# Patient Record
Sex: Male | Born: 1960
Health system: Southern US, Academic
[De-identification: ages and names within clinical notes are randomized; demographics above are authoritative.]

## PROBLEM LIST (undated history)

## (undated) ENCOUNTER — Ambulatory Visit: Payer: MEDICARE | Attending: Radiation Oncology | Primary: Radiation Oncology

## (undated) ENCOUNTER — Encounter

## (undated) ENCOUNTER — Ambulatory Visit: Payer: MEDICARE | Attending: Hematology & Oncology | Primary: Hematology & Oncology

## (undated) ENCOUNTER — Ambulatory Visit: Payer: MEDICARE

## (undated) ENCOUNTER — Telehealth: Attending: Hematology & Oncology | Primary: Hematology & Oncology

## (undated) ENCOUNTER — Encounter: Attending: Hematology & Oncology | Primary: Hematology & Oncology

## (undated) ENCOUNTER — Ambulatory Visit

## (undated) ENCOUNTER — Telehealth

## (undated) ENCOUNTER — Encounter
Attending: Pharmacist Clinician (PhC)/ Clinical Pharmacy Specialist | Primary: Pharmacist Clinician (PhC)/ Clinical Pharmacy Specialist

## (undated) ENCOUNTER — Ambulatory Visit
Payer: MEDICARE | Attending: Pharmacist Clinician (PhC)/ Clinical Pharmacy Specialist | Primary: Pharmacist Clinician (PhC)/ Clinical Pharmacy Specialist

## (undated) ENCOUNTER — Ambulatory Visit
Attending: Pharmacist Clinician (PhC)/ Clinical Pharmacy Specialist | Primary: Pharmacist Clinician (PhC)/ Clinical Pharmacy Specialist

## (undated) DIAGNOSIS — G8929 Other chronic pain: Secondary | ICD-10-CM

## (undated) DIAGNOSIS — N183 Chronic kidney disease, stage 3 unspecified: Secondary | ICD-10-CM

## (undated) DIAGNOSIS — C799 Secondary malignant neoplasm of unspecified site: Secondary | ICD-10-CM

## (undated) DIAGNOSIS — R5382 Chronic fatigue, unspecified: Secondary | ICD-10-CM

## (undated) HISTORY — DX: Other chronic pain: G89.29

## (undated) HISTORY — PX: APPENDECTOMY: SHX54

## (undated) HISTORY — DX: Chronic fatigue, unspecified: R53.82

## (undated) SURGERY — BRONCHOSCOPY, WITH EBUS
Anesthesia: General | Laterality: Right

---
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
## 2014-09-18 ENCOUNTER — Encounter (HOSPITAL_COMMUNITY): Payer: Self-pay | Admitting: *Deleted

## 2014-09-18 ENCOUNTER — Inpatient Hospital Stay (HOSPITAL_COMMUNITY)
Admission: AD | Admit: 2014-09-18 | Discharge: 2014-09-24 | DRG: 987 | Disposition: A | Discharge status: Skilled Nursing Facility | Payer: Medicare Other | Source: Other Acute Inpatient Hospital | Attending: Internal Medicine | Admitting: Internal Medicine

## 2014-09-18 DIAGNOSIS — M069 Rheumatoid arthritis, unspecified: Secondary | ICD-10-CM | POA: Diagnosis present

## 2014-09-18 DIAGNOSIS — C801 Malignant (primary) neoplasm, unspecified: Secondary | ICD-10-CM

## 2014-09-18 DIAGNOSIS — J9620 Acute and chronic respiratory failure, unspecified whether with hypoxia or hypercapnia: Secondary | ICD-10-CM | POA: Diagnosis present

## 2014-09-18 DIAGNOSIS — Z87891 Personal history of nicotine dependence: Secondary | ICD-10-CM | POA: Diagnosis not present

## 2014-09-18 DIAGNOSIS — Z6820 Body mass index (BMI) 20.0-20.9, adult: Secondary | ICD-10-CM | POA: Diagnosis not present

## 2014-09-18 DIAGNOSIS — N489 Disorder of penis, unspecified: Secondary | ICD-10-CM | POA: Diagnosis present

## 2014-09-18 DIAGNOSIS — C3411 Malignant neoplasm of upper lobe, right bronchus or lung: Principal | ICD-10-CM | POA: Diagnosis present

## 2014-09-18 DIAGNOSIS — R5381 Other malaise: Secondary | ICD-10-CM | POA: Diagnosis present

## 2014-09-18 DIAGNOSIS — R590 Localized enlarged lymph nodes: Secondary | ICD-10-CM | POA: Diagnosis present

## 2014-09-18 DIAGNOSIS — R0602 Shortness of breath: Secondary | ICD-10-CM | POA: Diagnosis present

## 2014-09-18 DIAGNOSIS — C7951 Secondary malignant neoplasm of bone: Secondary | ICD-10-CM | POA: Diagnosis present

## 2014-09-18 DIAGNOSIS — Z419 Encounter for procedure for purposes other than remedying health state, unspecified: Secondary | ICD-10-CM

## 2014-09-18 DIAGNOSIS — E440 Moderate protein-calorie malnutrition: Secondary | ICD-10-CM | POA: Diagnosis present

## 2014-09-18 DIAGNOSIS — R52 Pain, unspecified: Secondary | ICD-10-CM

## 2014-09-18 DIAGNOSIS — N179 Acute kidney failure, unspecified: Secondary | ICD-10-CM | POA: Diagnosis present

## 2014-09-18 DIAGNOSIS — J9601 Acute respiratory failure with hypoxia: Secondary | ICD-10-CM | POA: Diagnosis present

## 2014-09-18 DIAGNOSIS — R918 Other nonspecific abnormal finding of lung field: Secondary | ICD-10-CM | POA: Diagnosis present

## 2014-09-18 LAB — COMPREHENSIVE METABOLIC PANEL
ALT: 34 U/L (ref 0–53)
AST: 40 U/L — AB (ref 0–37)
Albumin: 2.3 g/dL — ABNORMAL LOW (ref 3.5–5.2)
Alkaline Phosphatase: 988 U/L — ABNORMAL HIGH (ref 39–117)
Anion gap: 7 (ref 5–15)
BUN: 29 mg/dL — ABNORMAL HIGH (ref 6–23)
CO2: 18 mmol/L — ABNORMAL LOW (ref 19–32)
Calcium: 8.1 mg/dL — ABNORMAL LOW (ref 8.4–10.5)
Chloride: 113 mmol/L — ABNORMAL HIGH (ref 96–112)
Creatinine, Ser: 1.55 mg/dL — ABNORMAL HIGH (ref 0.50–1.35)
GFR, EST AFRICAN AMERICAN: 57 mL/min — AB (ref >90)
GFR, EST NON AFRICAN AMERICAN: 49 mL/min — AB (ref >90)
Glucose, Bld: 116 mg/dL — ABNORMAL HIGH (ref 70–99)
POTASSIUM: 5 mmol/L (ref 3.5–5.1)
SODIUM: 138 mmol/L (ref 135–145)
Total Bilirubin: 0.3 mg/dL (ref 0.3–1.2)
Total Protein: 6.7 g/dL (ref 6.0–8.3)

## 2014-09-18 LAB — PROTIME-INR
INR: 1.16 (ref 0.00–1.49)
PROTHROMBIN TIME: 15 s (ref 11.6–15.2)

## 2014-09-18 LAB — CBC
HCT: 38.9 % — ABNORMAL LOW (ref 39.0–52.0)
Hemoglobin: 13 g/dL (ref 13.0–17.0)
MCH: 27.8 pg (ref 26.0–34.0)
MCHC: 33.4 g/dL (ref 30.0–36.0)
MCV: 83.3 fL (ref 78.0–100.0)
PLATELETS: 389 10*3/uL (ref 150–400)
RBC: 4.67 MIL/uL (ref 4.22–5.81)
RDW: 15.1 % (ref 11.5–15.5)
WBC: 15.4 10*3/uL — ABNORMAL HIGH (ref 4.0–10.5)

## 2014-09-18 MED ORDER — SODIUM CHLORIDE 0.9 % IJ SOLN: 3.0000 mL | INTRAMUSCULAR | Status: DC | PRN

## 2014-09-18 MED ORDER — ONDANSETRON HCL 4 MG/2ML IJ SOLN: 4.0000 mg | Freq: Four times a day (QID) | INTRAMUSCULAR | Status: DC | PRN

## 2014-09-18 MED ORDER — MORPHINE SULFATE 2 MG/ML IJ SOLN
1.0000 mg | INTRAMUSCULAR | Status: DC | PRN
Start: 2014-09-18 — End: 2014-09-24
  Administered 2014-09-18 – 2014-09-24 (×14): 1 mg via INTRAVENOUS
  Filled 2014-09-18 (×14): qty 1

## 2014-09-18 MED ORDER — OXYCODONE HCL 5 MG PO TABS
5.0000 mg | ORAL_TABLET | ORAL | Status: DC | PRN
  Administered 2014-09-18 – 2014-09-24 (×11): 5 mg via ORAL
  Filled 2014-09-18 (×11): qty 1

## 2014-09-18 MED ORDER — ALBUTEROL SULFATE (2.5 MG/3ML) 0.083% IN NEBU
2.5000 mg | INHALATION_SOLUTION | RESPIRATORY_TRACT | Status: DC | PRN
  Administered 2014-09-22: 2.5 mg via RESPIRATORY_TRACT
  Filled 2014-09-18: qty 3

## 2014-09-18 MED ORDER — IPRATROPIUM-ALBUTEROL 0.5-2.5 (3) MG/3ML IN SOLN
RESPIRATORY_TRACT | Status: AC
  Filled 2014-09-18: qty 3

## 2014-09-18 MED ORDER — HEPARIN SODIUM (PORCINE) 5000 UNIT/ML IJ SOLN
5000.0000 [IU] | Freq: Three times a day (TID) | INTRAMUSCULAR | Status: DC
  Administered 2014-09-18 – 2014-09-20 (×6): 5000 [IU] via SUBCUTANEOUS
  Filled 2014-09-18 (×9): qty 1

## 2014-09-18 MED ORDER — IPRATROPIUM-ALBUTEROL 0.5-2.5 (3) MG/3ML IN SOLN
3.0000 mL | Freq: Three times a day (TID) | RESPIRATORY_TRACT | Status: DC
  Administered 2014-09-18 – 2014-09-24 (×16): 3 mL via RESPIRATORY_TRACT
  Filled 2014-09-18 (×18): qty 3

## 2014-09-18 MED ORDER — ACETAMINOPHEN 650 MG RE SUPP: 650.0000 mg | Freq: Four times a day (QID) | RECTAL | Status: DC | PRN

## 2014-09-18 MED ORDER — ACETAMINOPHEN 325 MG PO TABS: 650.0000 mg | ORAL_TABLET | Freq: Four times a day (QID) | ORAL | Status: DC | PRN

## 2014-09-18 MED ORDER — ONDANSETRON HCL 4 MG PO TABS: 4.0000 mg | ORAL_TABLET | Freq: Four times a day (QID) | ORAL | Status: DC | PRN

## 2014-09-18 MED ORDER — GUAIFENESIN-DM 100-10 MG/5ML PO SYRP
5.0000 mL | ORAL_SOLUTION | ORAL | Status: DC | PRN
  Filled 2014-09-18: qty 5

## 2014-09-18 MED ORDER — SODIUM CHLORIDE 0.9 % IJ SOLN
3.0000 mL | Freq: Two times a day (BID) | INTRAMUSCULAR | Status: DC
  Administered 2014-09-18 – 2014-09-22 (×9): 3 mL via INTRAVENOUS

## 2014-09-18 MED ORDER — SODIUM CHLORIDE 0.9 % IV SOLN: 250.0000 mL | INTRAVENOUS | Status: DC | PRN

## 2014-09-18 MED ORDER — ALUM & MAG HYDROXIDE-SIMETH 200-200-20 MG/5ML PO SUSP: 30.0000 mL | Freq: Four times a day (QID) | ORAL | Status: DC | PRN

## 2014-09-18 NOTE — H&P (Signed)
PATIENT DETAILS Name: Francisco Love Age: 54 y.o. Sex: male Date of Birth: 1961/07/04 Admit Date: 09/18/2014 ASN:[id number] [name], [name], MD  CHIEF COMPLAINT:  Worsening Shortness of breath 2 weeks  HPI: Francisco Love is a 54 y.o. male with a Past Medical History of tobacco abuse who presents today with the above noted complaint. Per patient, for the past 2 weeks he has had significant worsening of his shortness of breath, shortness of breath is mostly on exertion. He claims that he is very short of breath upon walking from his car across his driveway to his front door. Because of persistently worsening exertional dyspnea, he presented to his local emergency room where a CT angiogram of the chest was done, it was negative for pulmonary embolism, however it was positive for a large right upper lobe encasing the right pulmonary artery and encroaching onto the carina of the trachea. He was then transferred to [facility] for further evaluation and treatment. Patient denies any chest pain, nausea, vomiting or diarrhea.  ALLERGIES:  Allergies not on file  PAST MEDICAL HISTORY: Tobacco abuse Lichen planus of the penis  PAST SURGICAL HISTORY: No past surgical history on file.  MEDICATIONS AT HOME: Prior to Admission medications   Not on File    FAMILY HISTORY: Has any family history of CAD  SOCIAL HISTORY:  has no tobacco, alcohol, and drug history on file.  REVIEW OF SYSTEMS:  Constitutional:   No  weight loss, night sweats,  Fevers, chills, fatigue.  HEENT:    No headaches, Difficulty swallowing,Tooth/dental problems,Sore throat,   Cardio-vascular: No chest pain,  Orthopnea, PND, swelling in lower extremities, anasarca, dizziness, palpitations  GI:  No heartburn, indigestion, abdominal pain, nausea, vomiting, diarrhea, change in bowel habits, loss of appetite  Resp: No shortness of breath with exertion or at rest.  No excess mucus, no productive  cough, No non-productive cough  Skin:  no rash or lesions.  GU:  no dysuria, change in color of urine, no urgency or frequency.  No flank pain.  Musculoskeletal: No joint pain or swelling.  No decreased range of motion.  No back pain.  Psych: No change in mood or affect. No depression or anxiety.  No memory loss.   PHYSICAL EXAM: Blood pressure 101/73, pulse 98, temperature 98.2 F (36.8 C), temperature source Oral, resp. rate 22, SpO2 94 %.  General appearance :Awake, alert, not in any distress. Speech Clear. Not toxic Looking HEENT: Atraumatic and Normocephalic, pupils equally reactive to light and accomodation Neck: supple, no JVD. No cervical lymphadenopathy.  Chest:Good air entry bilaterally, no added sounds. No prominent chest pains or neck veins. CVS: S1 S2 regular, no murmurs.  Abdomen: Bowel sounds present, Non tender and not distended with no gaurding, rigidity or rebound. Extremities: B/L Lower Ext shows no edema, both legs are warm to touch Neurology: Awake alert, and oriented X 3, CN II-XII intact, Non focal Skin:No Rash Wounds:N/A  LABS ON ADMISSION:  No results for input(s): NA, K, CL, CO2, GLUCOSE, BUN, CREATININE, CALCIUM, MG, PHOS in the last 72 hours. No results for input(s): AST, ALT, ALKPHOS, BILITOT, PROT, ALBUMIN in the last 72 hours. No results for input(s): LIPASE, AMYLASE in the last 72 hours. No results for input(s): WBC, NEUTROABS, HGB, HCT, MCV, PLT in the last 72 hours. No results for input(s): CKTOTAL, CKMB, CKMBINDEX, TROPONINI in the last 72 hours. No results for input(s): DDIMER in the last 72 hours. Invalid input(s): POCBNP  RADIOLOGIC STUDIES ON ADMISSION: No results found.    ASSESSMENT AND PLAN: Present on Admission:  . Lung mass: High suspicion for malignancy, no evidence of superior vena cava syndrome clinically. Case discussed with pulmonary on call Dr. Gwenette Greet, he will evaluate and provide further recommendations.   . Acute  respiratory failure: Likely secondary to above. Continue oxygen via nasal cannula. He currently is not short of breath at rest, but is mostly short of breath on ambulation.  Further plan will depend as patient's clinical course evolves and further radiologic and laboratory data become available. Patient will be monitored closely.  Above noted plan was discussed with patient, he was in agreement.   DVT Prophylaxis: Prophylactic  Heparin  Code Status: Full Code  Disposition Plan: Home when workup complete   Total time spent for admission equals 45 minutes.  [facility] Pager [phone]  If 7PM-7AM, please contact night-coverage [email or web address] Password [email or web address] 09/18/2014, 2:02 PM

## 2014-09-19 ENCOUNTER — Other Ambulatory Visit: Payer: Self-pay

## 2014-09-19 LAB — CBC
HCT: 36.2 % — ABNORMAL LOW (ref 39.0–52.0)
HEMOGLOBIN: 12 g/dL — AB (ref 13.0–17.0)
MCH: 27.6 pg (ref 26.0–34.0)
MCHC: 33.1 g/dL (ref 30.0–36.0)
MCV: 83.4 fL (ref 78.0–100.0)
Platelets: 393 10*3/uL (ref 150–400)
RBC: 4.34 MIL/uL (ref 4.22–5.81)
RDW: 15.1 % (ref 11.5–15.5)
WBC: 12.2 10*3/uL — ABNORMAL HIGH (ref 4.0–10.5)

## 2014-09-19 LAB — BASIC METABOLIC PANEL
Anion gap: 5 (ref 5–15)
BUN: 28 mg/dL — AB (ref 6–23)
CALCIUM: 8.1 mg/dL — AB (ref 8.4–10.5)
CHLORIDE: 114 mmol/L — AB (ref 96–112)
CO2: 18 mmol/L — AB (ref 19–32)
CREATININE: 1.28 mg/dL (ref 0.50–1.35)
GFR calc Af Amer: 71 mL/min — ABNORMAL LOW (ref >90)
GFR, EST NON AFRICAN AMERICAN: 61 mL/min — AB (ref >90)
Glucose, Bld: 108 mg/dL — ABNORMAL HIGH (ref 70–99)
Potassium: 4.5 mmol/L (ref 3.5–5.1)
Sodium: 137 mmol/L (ref 135–145)

## 2014-09-19 NOTE — Progress Notes (Addendum)
[facility] PROGRESS NOTE  Rutilio Yellowhair ERX:[id number] DOB: [birth year] DOA: 09/18/2014 PCP: [name], [name], MD  Brief narrative 54 year old male with history of tobacco abuse who presented with worsening shortness of breath for the past 2 weeks. He went to 3 months he was home in [place] with a CT angiogram of the chest was done which was negative for PE but showed a large right upper lobe mass encasing right pulmonary artery and encroaching onto the carina. Patient was then transferred to [facility] for further management.  Assessment/Plan: Lung mass Likely malignant. Admitting hospitalist discussed with pulmonary consult Dr. Gwenette Greet. Pt brought CD with the image with him. Unable to upload it over the weekend. Will be uploaded into the system tomorrow morning. Pulmonary will review the imaging tomorrow and recommend best approach for biopsy. Either bronchoscopy or IR guided.  Acute hypoxic respiratory failure Secondary to the lung mass. Continue O2 via nasal cannula. Clinically better today. Supportive care with when necessary nebs and antitussives.  Acute kidney injury Seen on admission. Result today  Tobacco abuse Patient reports quitting smoking for past 4-5 months.  Diet: Regular DVT prophylaxis: Subcutaneous heparin     Code Status: Full code Family Communication: None at bedside Disposition Plan: Home once workup completed   Consultants:  Pulmonary  Procedures:  None  Antibiotics:  None  HPI/Subjective: Sent seen and examined. Reports his breathing to be better.  Objective: Filed Vitals:   09/19/14 0539  BP: 103/67  Pulse: 76  Temp: 99.2 F (37.3 C)  Resp: 18    Intake/Output Summary (Last 24 hours) at 09/19/14 1325 Last data filed at 09/19/14 0900  Gross per 24 hour  Intake    120 ml  Output    300 ml  Net   -180 ml   Filed Weights   09/18/14 1515  Weight: 65.772 kg (145 lb)    Exam:   General:   Middle-aged male in no acute distress  HEENT: No pallor, moist oral mucosa, supple neck, no cervical lymphadenopathy  Cardiovascular: normal S1 and S2, no murmurs rub or gallop  Respiratory: clear  to auscultation bilaterally, no added sounds  GI: Soft, nondistended, bowel sounds present  Musculoskeletal: Warm, no edema  Data Reviewed: Basic Metabolic Panel:  Recent Labs Lab 09/18/14 1900 09/19/14 0530  NA 138 137  K 5.0 4.5  CL 113* 114*  CO2 18* 18*  GLUCOSE 116* 108*  BUN 29* 28*  CREATININE 1.55* 1.28  CALCIUM 8.1* 8.1*   Liver Function Tests:  Recent Labs Lab 09/18/14 1900  AST 40*  ALT 34  ALKPHOS 988*  BILITOT 0.3  PROT 6.7  ALBUMIN 2.3*   No results for input(s): LIPASE, AMYLASE in the last 168 hours. No results for input(s): AMMONIA in the last 168 hours. CBC:  Recent Labs Lab 09/18/14 1900 09/19/14 0530  WBC 15.4* 12.2*  HGB 13.0 12.0*  HCT 38.9* 36.2*  MCV 83.3 83.4  PLT 389 393   Cardiac Enzymes: No results for input(s): CKTOTAL, CKMB, CKMBINDEX, TROPONINI in the last 168 hours. BNP (last 3 results) No results for input(s): PROBNP in the last 8760 hours. CBG: No results for input(s): GLUCAP in the last 168 hours.  No results found for this or any previous visit (from the past 240 hour(s)).   Studies: No results found.  Scheduled Meds: . heparin  5,000 Units Subcutaneous 3 times per day  . ipratropium-albuterol  3 mL Nebulization TID  . sodium chloride  3 mL  Intravenous Q12H   Continuous Infusions:     Time spent: 25 minutes    Tyronda Vizcarrondo, Am[place]  [facility] Pager [phone]. If 7PM-7AM, please contact night-coverage at [email or web address], password [facility] 09/19/2014, 1:25 PM  LOS: 1 day

## 2014-09-20 ENCOUNTER — Encounter (HOSPITAL_COMMUNITY): Payer: Self-pay | Admitting: Radiology

## 2014-09-20 ENCOUNTER — Inpatient Hospital Stay (HOSPITAL_COMMUNITY): Payer: Medicare Other

## 2014-09-20 DIAGNOSIS — R0602 Shortness of breath: Secondary | ICD-10-CM

## 2014-09-20 DIAGNOSIS — N4889 Other specified disorders of penis: Secondary | ICD-10-CM

## 2014-09-20 DIAGNOSIS — R918 Other nonspecific abnormal finding of lung field: Secondary | ICD-10-CM

## 2014-09-20 LAB — HEPATIC FUNCTION PANEL
ALT: 34 U/L (ref 0–53)
AST: 29 U/L (ref 0–37)
Albumin: 2.3 g/dL — ABNORMAL LOW (ref 3.5–5.2)
Alkaline Phosphatase: 882 U/L — ABNORMAL HIGH (ref 39–117)
Total Bilirubin: 0.1 mg/dL — ABNORMAL LOW (ref 0.3–1.2)
Total Protein: 6.5 g/dL (ref 6.0–8.3)

## 2014-09-20 MED ORDER — IOHEXOL 300 MG/ML  SOLN
100.0000 mL | Freq: Once | INTRAMUSCULAR | Status: AC | PRN
  Administered 2014-09-20: 100 mL via INTRAVENOUS

## 2014-09-20 MED ORDER — HYDROCORTISONE 1 % EX CREA
TOPICAL_CREAM | Freq: Two times a day (BID) | CUTANEOUS | Status: DC
  Administered 2014-09-20 – 2014-09-24 (×8): via TOPICAL
  Filled 2014-09-20 (×2): qty 28

## 2014-09-20 NOTE — Consult Note (Signed)
Name: Francisco Love MRN: [id number] DOB: 1960/12/21    ADMISSION DATE:  09/18/2014 CONSULTATION DATE:  09/20/2014  REFERRING MD :  [facility]  CHIEF COMPLAINT:  Lung mass  BRIEF PATIENT DESCRIPTION:  54 yo male former smoker with progressive dyspnea.  He had recent CT chest as outpt which showed lung mass.  SIGNIFICANT EVENTS  1/30 admit  STUDIES:  1/29 CT chest (OSH) >> RUL/mediastinal mass, emphysema   HISTORY OF PRESENT ILLNESS:   54 yo male presented to OSH with progressive dyspnea.  He has extensive hx of smoking, and quit few months ago.  He had CXR which showed fullness, and then had CT chest.  He was transferred to [facility] for further assessment.  Unfortunately his CT chest was not available for review until today, and pulmonary consult was then done.  He denies headache, blurred vision, hoarseness, dysphagia, gland swelling, chest pain, fever, hemoptysis, sweats, abdominal pain, or weight loss.   He reports hx of rheumatoid arthritis in his legs and takes ibuprofen.  PAST MEDICAL HISTORY :   has a past medical history of Shortness of breath dyspnea.  has past surgical history that includes Appendectomy. Prior to Admission medications   Medication Sig Start Date End Date Taking? Authorizing Provider  ibuprofen (ADVIL,MOTRIN) 200 MG tablet Take 400 mg by mouth daily.   Yes Historical Provider, MD   No Known Allergies  FAMILY HISTORY:  family history is not on file. SOCIAL HISTORY:  reports that he quit smoking about 3 months ago. His smoking use included Cigarettes. He smoked 1.00 pack per day. He does not have any smokeless tobacco history on file. He reports that he uses illicit drugs (Marijuana). He reports that he does not drink alcohol.  REVIEW OF SYSTEMS:   Negative except above SUBJECTIVE:   VITAL SIGNS: Temp:  [97.5 F (36.4 C)-98.1 F (36.7 C)] 98.1 F (36.7 C) (02/01 0524) Pulse Rate:  [88-102] 88 (02/01 0524) Resp:  [17-18] 17 (02/01 0524) BP:  (100-112)/(70-78) 108/70 mmHg (02/01 0524) SpO2:  [99 %-100 %] 100 % (02/01 0524)  PHYSICAL EXAMINATION: General: no distress Neuro: alert, normal strength, CN intact HEENT: pupils reactive, no sinus tenderness, MP 2, poor dentition, no LAN Cardiovascular: regular Lungs: bronchial breath sounds upper lobes, no wheeze Abdomen: soft, non tender Musculoskeletal: no edema Skin: desquamation of dorsal penis    Recent Labs Lab 09/18/14 1900 09/19/14 0530  NA 138 137  K 5.0 4.5  CL 113* 114*  CO2 18* 18*  BUN 29* 28*  CREATININE 1.55* 1.28  GLUCOSE 116* 108*     Recent Labs Lab 09/18/14 1900 09/19/14 0530  HGB 13.0 12.0*  HCT 38.9* 36.2*  WBC 15.4* 12.2*  PLT 389 393    Dg Outside Films Extremity  09/19/2014   This examination belongs to an outside facility and is stored  here for comparison purposes only.  Contact the originating outside  institution for any associated report or interpretation.   ASSESSMENT / PLAN:  54 yo male smoker with RUL and mediastinal mass >> main concern is for malignancy. Plan: - He has been scheduled for bronchoscopy at 8 am on 09/21/14 - procedure explained to patient >> risks detailed as bleeding, infection, pneumothorax, and non diagnosis  Hx of smoking with changes of emphysema on CT chest. Plan: - will need PFT's as outpt  ?Hx of Rheumatoid arthritis. Plan: - per primary team  AKI >> resolved.  Chesley Mires, MD [facility] Pulmonary/Critical Care 09/20/2014, 2:23 PM Pager:  [phone]  After 3pm call: [phone]

## 2014-09-20 NOTE — Progress Notes (Addendum)
[facility] PROGRESS NOTE  Meagan Spease [id number]:[id number] DOB: [birth year] DOA: 09/18/2014 PCP: [name], [name], MD  Brief narrative 54 year old male with history of tobacco abuse who presented with worsening shortness of breath for the past 2 weeks. He went to ED in [place] , [place] where a CT angiogram of the chest was done which was negative for PE but showed a large right upper lobe mass encasing right pulmonary artery and encroaching onto the carina. Patient was then transferred to [facility] for further management.  Assessment/Plan: Lung mass Likely malignant. Admitting hospitalist discussed with pulmonary consult Dr. Gwenette Greet. CT angio done at outside hospital uploaded into the system. Pulmonary will see pt today and decide best approach for biopsy.  continue o2 via [place].  Acute hypoxic respiratory failure Secondary to the lung mass. Continue O2 via nasal cannula. Still dyspneic on minimal exertion.  Supportive care with when necessary nebs and antitussives.  Penile lesion  desquamation of the dorsal glans penis. Reports non bloody discharge from the skin.. No urethral discharge.non tender, or itchy. Possible for lichen sclerosis.  Will place on hydrocortisone ointment . Check HIV ab Needs dermatology evaluation as outpt  Acute kidney injury Seen on admission. Resolved.  Elevated alk P Repeat LFTs. Check CT abd and pelvis to r/o mets   Tobacco abuse Patient reports quitting smoking for past 4-5 months.  Diet: Regular  DVT prophylaxis: Subcutaneous heparin     Code Status: Full code Family Communication: None at bedside Disposition Plan: Home once workup completed   Consultants:  Pulmonary  Procedures:  None  Antibiotics:  None  HPI/Subjective: Sent seen and examined. Still has dyspnea on minimal exertion  Objective: Filed Vitals:   09/20/14 0524  BP: 108/70  Pulse: 88  Temp: 98.1 F (36.7 C)  Resp: 17    Intake/Output  Summary (Last 24 hours) at 09/20/14 0092 Last data filed at 09/20/14 3300  Gross per 24 hour  Intake      0 ml  Output    900 ml  Net   -900 ml   Filed Weights   09/18/14 1515  Weight: 65.772 kg (145 lb)    Exam:   General:  Middle-aged male in no acute distress  HEENT: No pallor, moist oral mucosa, supple neck, no cervical, supraclavicular  or axillary lymphadenopathy  Cardiovascular: normal S1 and S2, no murmurs rub or gallop  Respiratory: clear  to auscultation bilaterally, no added sounds  GI: Soft, nondistended, bowel sounds present, desquamation of dorsal glans penis with erythema. No discharge or bleeding.  Musculoskeletal: Warm, no edema  Data Reviewed: Basic Metabolic Panel:  Recent Labs Lab 09/18/14 1900 09/19/14 0530  NA 138 137  K 5.0 4.5  CL 113* 114*  CO2 18* 18*  GLUCOSE 116* 108*  BUN 29* 28*  CREATININE 1.55* 1.28  CALCIUM 8.1* 8.1*   Liver Function Tests:  Recent Labs Lab 09/18/14 1900  AST 40*  ALT 34  ALKPHOS 988*  BILITOT 0.3  PROT 6.7  ALBUMIN 2.3*   No results for input(s): LIPASE, AMYLASE in the last 168 hours. No results for input(s): AMMONIA in the last 168 hours. CBC:  Recent Labs Lab 09/18/14 1900 09/19/14 0530  WBC 15.4* 12.2*  HGB 13.0 12.0*  HCT 38.9* 36.2*  MCV 83.3 83.4  PLT 389 393   Cardiac Enzymes: No results for input(s): CKTOTAL, CKMB, CKMBINDEX, TROPONINI in the last 168 hours. BNP (last 3 results) No results for input(s): PROBNP in the last 8760  hours. CBG: No results for input(s): GLUCAP in the last 168 hours.  No results found for this or any previous visit (from the past 240 hour(s)).   Studies: Dg Outside Films Extremity  09/19/2014   This examination belongs to an outside facility and is stored  here for comparison purposes only.  Contact the originating outside  institution for any associated report or interpretation.   Scheduled Meds: . heparin  5,000 Units Subcutaneous 3 times per day   . ipratropium-albuterol  3 mL Nebulization TID  . sodium chloride  3 mL Intravenous Q12H   Continuous Infusions:     Time spent: 25 minutes    Crew Goren  [facility] Pager [phone]. If 7PM-7AM, please contact night-coverage at [email or web address], password [facility] 09/20/2014, 9:58 AM  LOS: 2 days

## 2014-09-21 ENCOUNTER — Encounter (HOSPITAL_COMMUNITY)
Admission: AD | Disposition: A | Discharge status: Skilled Nursing Facility | Payer: Self-pay | Source: Other Acute Inpatient Hospital | Attending: Internal Medicine

## 2014-09-21 ENCOUNTER — Inpatient Hospital Stay (HOSPITAL_COMMUNITY): Payer: Medicare Other

## 2014-09-21 DIAGNOSIS — C7951 Secondary malignant neoplasm of bone: Secondary | ICD-10-CM | POA: Diagnosis present

## 2014-09-21 DIAGNOSIS — C801 Malignant (primary) neoplasm, unspecified: Secondary | ICD-10-CM

## 2014-09-21 DIAGNOSIS — R599 Enlarged lymph nodes, unspecified: Secondary | ICD-10-CM

## 2014-09-21 DIAGNOSIS — R590 Localized enlarged lymph nodes: Secondary | ICD-10-CM | POA: Diagnosis present

## 2014-09-21 HISTORY — PX: VIDEO BRONCHOSCOPY: SHX5072

## 2014-09-21 SURGERY — BRONCHOSCOPY, WITH FLUOROSCOPY
Anesthesia: Moderate Sedation

## 2014-09-21 MED ORDER — MIDAZOLAM HCL 5 MG/ML IJ SOLN
INTRAMUSCULAR | Status: AC
  Filled 2014-09-21: qty 2

## 2014-09-21 MED ORDER — MIDAZOLAM HCL 10 MG/2ML IJ SOLN
INTRAMUSCULAR | Status: DC | PRN
  Administered 2014-09-21 (×3): 1 mg via INTRAVENOUS

## 2014-09-21 MED ORDER — LIDOCAINE HCL (PF) 1 % IJ SOLN
INTRAMUSCULAR | Status: DC | PRN
  Administered 2014-09-21: 6 mL

## 2014-09-21 MED ORDER — PHENYLEPHRINE HCL 0.25 % NA SOLN
NASAL | Status: DC | PRN
  Administered 2014-09-21: 2 via NASAL

## 2014-09-21 MED ORDER — FENTANYL CITRATE 0.05 MG/ML IJ SOLN
INTRAMUSCULAR | Status: AC
  Filled 2014-09-21: qty 4

## 2014-09-21 MED ORDER — FENTANYL CITRATE 0.05 MG/ML IJ SOLN
INTRAMUSCULAR | Status: DC | PRN
  Administered 2014-09-21 (×2): 25 ug via INTRAVENOUS

## 2014-09-21 MED ORDER — LIDOCAINE VISCOUS 2 % MT SOLN
OROMUCOSAL | Status: DC | PRN
Start: 2014-09-21 — End: 2014-09-23
  Administered 2014-09-21: 1 via OROMUCOSAL

## 2014-09-21 NOTE — Op Note (Signed)
Indication : Paratracheal mass with RUL narrowing in this ex smoker. Written informed consent was obtained prior to the procedure. The risks of the procedure including coughing, bleeding and the small chance of lung puncture requiring chest tube were discussed in great detail. The benefits & alternatives including serial follow up were also discussed. Low yield of procedure in absence of endobronchial mass & possible need for second rpocedure (EBUS) was discussed   3 mg versed & 50  mcg fentnayl used in divided doses during the procedure Bronchoscope entered from the left nare. Upper airway nml Vocal cords showed nml appearance & motion. Trachea & bronchial tree examined to the subsegmental level. Mild amount of white secretions were noted. No endobronchial lesions seen. Trans bronchial needle aspiration x 4 were obtained from station 7 & station 4R. Brushings & BAL was also obtained from the RUL.  There was moderate coughing  during the procedure.    ALVA,RAKESH V.  [phone]

## 2014-09-21 NOTE — Progress Notes (Signed)
Name: Francisco Love MRN: [id number] DOB: 01/11/1961    ADMISSION DATE:  09/18/2014 CONSULTATION DATE:  09/20/2014  REFERRING MD :  [facility]  CHIEF COMPLAINT:  Lung mass  BRIEF PATIENT DESCRIPTION:  54 yo male former smoker with progressive dyspnea.  He had recent CT chest as outpt which showed lung mass.  SIGNIFICANT EVENTS  1/30 admit  STUDIES:  1/29 CT chest (OSH) >> RUL/mediastinal mass, emphysema   HISTORY OF PRESENT ILLNESS:   54 yo male presented to OSH with progressive dyspnea.  He has extensive hx of smoking, and quit few months ago.  He had CXR which showed fullness, and then had CT chest.  He was transferred to [facility] for further assessment.  Unfortunately his CT chest was not available for review until today, and pulmonary consult was then done.  He denies headache, blurred vision, hoarseness, dysphagia, gland swelling, chest pain, fever, hemoptysis, sweats, abdominal pain, or weight loss.   He reports hx of rheumatoid arthritis in his legs and takes ibuprofen.  SUBJECTIVE: c/o pain in legs afebrile  VITAL SIGNS: Temp:  [97.6 F (36.4 C)-97.8 F (36.6 C)] 97.6 F (36.4 C) (02/02 0545) Pulse Rate:  [83-102] 100 (02/02 0857) Resp:  [16-28] 22 (02/02 0857) BP: (92-124)/(60-85) 99/60 mmHg (02/02 0857) SpO2:  [85 %-100 %] 99 % (02/02 0857) Weight:  [63.504 kg (140 lb)] 63.504 kg (140 lb) (02/02 0754)  PHYSICAL EXAMINATION: General: no distress Neuro: alert, normal strength, CN intact HEENT: pupils reactive, no sinus tenderness, MP 2, poor dentition, no LAN Cardiovascular: regular Lungs: bronchial breath sounds upper lobes, no wheeze Abdomen: soft, non tender Musculoskeletal: no edema Skin: desquamation of dorsal penis    Recent Labs Lab 09/18/14 1900 09/19/14 0530  NA 138 137  K 5.0 4.5  CL 113* 114*  CO2 18* 18*  BUN 29* 28*  CREATININE 1.55* 1.28  GLUCOSE 116* 108*     Recent Labs Lab 09/18/14 1900 09/19/14 0530  HGB 13.0 12.0*  HCT 38.9*  36.2*  WBC 15.4* 12.2*  PLT 389 393    Ct Abdomen Pelvis W Contrast  09/20/2014   CLINICAL DATA:  Mediastinal mass.  Evaluate for metastatic disease.  EXAM: CT ABDOMEN AND PELVIS WITH CONTRAST  TECHNIQUE: Multidetector CT imaging of the abdomen and pelvis was performed using the standard protocol following bolus administration of intravenous contrast.  CONTRAST:  188mL OMNIPAQUE IOHEXOL 300 MG/ML  SOLN  COMPARISON:  Outside CT with 09/17/2014.  FINDINGS: Lower chest: Diffuse ground-glass opacities in lower lobes. Bibasilar atelectasis.  Hepatobiliary: Single low-density lesion in the posterior aspect the right hepatic lobe measures 6 mm on image 32 is too small characterize. No biliary duct dilatation. Gallbladder is normal.  Pancreas: Multilobular cystic lesions in the uncinate region of the pancreas with measuring approximately 21 x 25 mm on coronal image 42. There is calcifications within the uncinate process additionally. Main pancreatic duct is prominent within normal limits at 2 mm. The tail of pancreas is normal.  Spleen: Normal spleen.  Adrenals/urinary tract: Normal adrenal glands and kidneys. The ureters and bladder normal.  Stomach/Bowel: The stomach, small bowel, colon are unremarkable.  Vascular/Lymphatic: There is severe circumferential narrowing of the left common iliac artery (image 52). This artery does reconstitute.  No retroperitoneal periportal lymphadenopathy no central mesenteric adenopathy. No pelvic.  Reproductive: Asymmetric thickening along the peripheral zone of the prostate gland on the left (image 70).  Musculoskeletal: Severe degenerative changes hips remodeling of femoral heads with extensive sub chondral cystic change.  There are smudgy sclerotic lesions throughout skeleton. Lesions in left and right inferior pubic rami on image 79 and 7 7. Multiple lesions to the sacrum iliac bones. Lesions in the proximal left femur. Example lesions inn left iliac wing on image 60 measures 25  mm). Multiple lesions throughout lumbar spine hand ribs. Example lesion at T11 measures 20 mm  Other: Left inguinal hernia small amount fluid.  IMPRESSION: 1. Widespread skeletal metastasis in the pelvis, femurs, spine, and ribs. 2. Asymmetric thickening of the left aspect of prostate gland peripheral zone could represent prostate carcinoma if no primary discovered. 3. Cystic lesion within the uncinate process of pancreas. Recommend correlation with CA 19 none and followup MRI of the pancreas with contrast in no primary discovered. 4. Small hypodense lesion within the liver cannot be fully characterized. 5. Severe atherosclerotic narrowing of the left common iliac artery with reconstitution.   Electronically Signed   By: Suzy Bouchard M.D.   On: 09/20/2014 18:06   Dg Outside Films Extremity  09/19/2014   This examination belongs to an outside facility and is stored  here for comparison purposes only.  Contact the originating outside  institution for any associated report or interpretation.   ASSESSMENT / PLAN:  54 yo male smoker with RUL and mediastinal mass >> paratracheal & mild subcarinal lymphadenopathy main concern is for malignancy. Plan: - He has been scheduled for bronchoscopy at 8 am on 09/21/14 -he really needs EBUS with aspiration of 4R  - The risks of the procedure including coughing, bleeding and the small chance of lung puncture requiring chest tube were discussed in great detail. The benefits & alternatives including serial follow up were also discussed. Low yield of procedure in absence of endobronchial mass & possible need for second rpocedure (EBUS) was discussed  Hx of smoking with changes of emphysema on CT chest. Plan: - will need PFT's as outpt  ?Hx of Rheumatoid arthritis. Plan: - per primary team  AKI >> resolved.  He really wants diagnosis & wants me to proceed even if low yield  Shantara Goosby V. MD 09/21/2014, 9:01 AM

## 2014-09-21 NOTE — Clinical Documentation Improvement (Signed)
Dear Dr. Kara Mead   Please update your documentation with the medical record to reflect your response to this query. If you need help knowing how to do this please call [phone].       In reviewing the Bronchoscopy report noted documentation of aspiration sites at "station 7 and 4R".  Please provide specific  anatomical sites referring to those "stations", also if it was tissue  or fluid or gas aspirated and the :   Clinical site   Lung (left / right)  (lower/middle/upper)   Bronchus (left /right) ([place], Lower, Main, Middle, Upper)   Thank you   [facility] cannot make assumptions from the lab reports they can only code from providers specific documentation .   Thank you   [place]  Clinical Documentation Specialist: [place]

## 2014-09-21 NOTE — Care Management (Signed)
09-21-14 IM from Medicare given. Magdalen Spatz RN BSN

## 2014-09-21 NOTE — Progress Notes (Addendum)
[facility] PROGRESS NOTE  Asberry Lascola GYF:[id number] DOB: Aug 04, 1961 DOA: 09/18/2014 PCP: [name], [name], MD  Brief narrative 54 year old male with history of tobacco abuse who presented with worsening shortness of breath for the past 2 weeks. He went to ED in [place] , [place] where a CT angiogram of the chest was done which was negative for PE but showed a large right upper lobe mass encasing right pulmonary artery and encroaching onto the carina. Patient was then transferred to [facility] for further management. Pulmonary consulted. CT abd and pelvis shows diffuse skeletal metastasis., cystic lesion on pancreas and  Asymmetric thickening of prostate.   Assessment/Plan: Lung mass Likely malignant, primary vs metastatic.  CT angio done at outside hospital uploaded into the system.  Bronchoscopy attempted today shows no endobronchial lesion. Plan on Endobronchial [place] later this week. Transbronchial needle aspiration done. - continue o2 via [place].  Diffuse skeletal metastasis, possible pancreatic and prostatic lesion  as seen on CT Markedly elevated ALKP due to bony mets.  Check CA19-9 and if elevated order MRI of abd to evaluate pancreatic lesion further.  Acute hypoxic respiratory failure Secondary to the lung mass. Continue O2 via nasal cannula. Supportive care with when necessary nebs and antitussives.  Penile lesion  desquamation of the dorsal glans penis. Reports non bloody discharge from the skin.. No urethral discharge.non tender, or itchy. Possible for lichen sclerosis.  Feels better with  on hydrocortisone ointment . Check HIV ab Needs dermatology evaluation as outpt  Acute kidney injury Seen on admission. Resolved.    Tobacco abuse Patient reports quitting smoking for past 4-5 months.  Diet: Regular  DVT prophylaxis: Subcutaneous heparin     Code Status: Full code Family Communication: None at bedside Disposition Plan: Home once workup  completed   Consultants:  Pulmonary  Procedures:  None  Antibiotics:  None  HPI/Subjective: Seen and examined after bronchoscopy. reports some dyspnea on exertion and cough.   Objective: Filed Vitals:   09/21/14 1310  BP: 92/70  Pulse: 86  Temp: 98 F (36.7 C)  Resp: 20    Intake/Output Summary (Last 24 hours) at 09/21/14 1448 Last data filed at 09/21/14 0546  Gross per 24 hour  Intake    360 ml  Output   1250 ml  Net   -890 ml   Filed Weights   09/18/14 1515 09/21/14 0754  Weight: 65.772 kg (145 lb) 63.504 kg (140 lb)    Exam:   General:   in no acute distress  HEENT: No pallor, moist oral mucosa, supple neck,   Cardiovascular: normal S1 and S2, no murmurs rub or gallop  Respiratory: clear  to auscultation bilaterally, no added sounds  GI: Soft, nondistended, , desquamation of dorsal glans penis with erythema. No discharge or bleeding.  Musculoskeletal: Warm, no edema  Data Reviewed: Basic Metabolic Panel:  Recent Labs Lab 09/18/14 1900 09/19/14 0530  NA 138 137  K 5.0 4.5  CL 113* 114*  CO2 18* 18*  GLUCOSE 116* 108*  BUN 29* 28*  CREATININE 1.55* 1.28  CALCIUM 8.1* 8.1*   Liver Function Tests:  Recent Labs Lab 09/18/14 1900 09/20/14 1120  AST 40* 29  ALT 34 34  ALKPHOS 988* 882*  BILITOT 0.3 0.1*  PROT 6.7 6.5  ALBUMIN 2.3* 2.3*   No results for input(s): LIPASE, AMYLASE in the last 168 hours. No results for input(s): AMMONIA in the last 168 hours. CBC:  Recent Labs Lab 09/18/14 1900 09/19/14 0530  WBC 15.4* 12.2*  HGB 13.0 12.0*  HCT 38.9* 36.2*  MCV 83.3 83.4  PLT 389 393   Cardiac Enzymes: No results for input(s): CKTOTAL, CKMB, CKMBINDEX, TROPONINI in the last 168 hours. BNP (last 3 results) No results for input(s): PROBNP in the last 8760 hours. CBG: No results for input(s): GLUCAP in the last 168 hours.  No results found for this or any previous visit (from the past 240 hour(s)).   Studies: Ct  Abdomen Pelvis W Contrast  09/20/2014   CLINICAL DATA:  Mediastinal mass.  Evaluate for metastatic disease.  EXAM: CT ABDOMEN AND PELVIS WITH CONTRAST  TECHNIQUE: Multidetector CT imaging of the abdomen and pelvis was performed using the standard protocol following bolus administration of intravenous contrast.  CONTRAST:  160mL OMNIPAQUE IOHEXOL 300 MG/ML  SOLN  COMPARISON:  Outside CT with 09/17/2014.  FINDINGS: Lower chest: Diffuse ground-glass opacities in lower lobes. Bibasilar atelectasis.  Hepatobiliary: Single low-density lesion in the posterior aspect the right hepatic lobe measures 6 mm on image 32 is too small characterize. No biliary duct dilatation. Gallbladder is normal.  Pancreas: Multilobular cystic lesions in the uncinate region of the pancreas with measuring approximately 21 x 25 mm on coronal image 42. There is calcifications within the uncinate process additionally. Main pancreatic duct is prominent within normal limits at 2 mm. The tail of pancreas is normal.  Spleen: Normal spleen.  Adrenals/urinary tract: Normal adrenal glands and kidneys. The ureters and bladder normal.  Stomach/Bowel: The stomach, small bowel, colon are unremarkable.  Vascular/Lymphatic: There is severe circumferential narrowing of the left common iliac artery (image 52). This artery does reconstitute.  No retroperitoneal periportal lymphadenopathy no central mesenteric adenopathy. No pelvic.  Reproductive: Asymmetric thickening along the peripheral zone of the prostate gland on the left (image 70).  Musculoskeletal: Severe degenerative changes hips remodeling of femoral heads with extensive sub chondral cystic change.  There are smudgy sclerotic lesions throughout skeleton. Lesions in left and right inferior pubic rami on image 79 and 7 7. Multiple lesions to the sacrum iliac bones. Lesions in the proximal left femur. Example lesions inn left iliac wing on image 60 measures 25 mm). Multiple lesions throughout lumbar spine  hand ribs. Example lesion at T11 measures 20 mm  Other: Left inguinal hernia small amount fluid.  IMPRESSION: 1. Widespread skeletal metastasis in the pelvis, femurs, spine, and ribs. 2. Asymmetric thickening of the left aspect of prostate gland peripheral zone could represent prostate carcinoma if no primary discovered. 3. Cystic lesion within the uncinate process of pancreas. Recommend correlation with CA 19 none and followup MRI of the pancreas with contrast in no primary discovered. 4. Small hypodense lesion within the liver cannot be fully characterized. 5. Severe atherosclerotic narrowing of the left common iliac artery with reconstitution.   Electronically Signed   By: Suzy Bouchard M.D.   On: 09/20/2014 18:06   Dg Outside Films Extremity  09/19/2014   This examination belongs to an outside facility and is stored  here for comparison purposes only.  Contact the originating outside  institution for any associated report or interpretation.   Scheduled Meds: . hydrocortisone cream   Topical BID  . ipratropium-albuterol  3 mL Nebulization TID  . sodium chloride  3 mL Intravenous Q12H   Continuous Infusions:     Time spent: 25 minutes    Nihal Marzella, [place]  [facility] Pager [phone]. If 7PM-7AM, please contact night-coverage at [email or web address], password [facility] 09/21/2014, 2:48 PM  LOS: 3 days

## 2014-09-21 NOTE — Progress Notes (Signed)
Video bronchoscopy with wang needle intervention, washing intervention, brushing intervention. Pt. Did well thru out procedure. Gave report to Precious Bard RN for Mr. Barnier 50 Fentayl, 3 Versed, told RN what samples we did.

## 2014-09-22 ENCOUNTER — Encounter (HOSPITAL_COMMUNITY): Payer: Self-pay | Admitting: Pulmonary Disease

## 2014-09-22 LAB — CANCER ANTIGEN 19-9: CA 19 9: 19.1 U/mL — AB (ref <35.0)

## 2014-09-22 NOTE — Progress Notes (Signed)
Name: Francisco Love MRN: [id number] DOB: [birth year]    ADMISSION DATE:  09/18/2014 CONSULTATION DATE:  09/20/2014  REFERRING MD :  [facility]  CHIEF COMPLAINT:  Lung mass  BRIEF PATIENT DESCRIPTION:  54 yo male former smoker with progressive dyspnea.  He had recent CT chest as outpt which showed lung mass.  SIGNIFICANT EVENTS  1/30 admit  STUDIES:  1/29 CT chest (OSH) >> RUL/mediastinal mass, emphysema   HISTORY OF PRESENT ILLNESS:   54 yo male presented to OSH with progressive dyspnea.  He has extensive hx of smoking, and quit few months ago.  He had CXR which showed fullness, and then had CT chest.  He was transferred to [facility] for further assessment.  Unfortunately his CT chest was not available for review until today, and pulmonary consult was then done.  He denies headache, blurred vision, hoarseness, dysphagia, gland swelling, chest pain, fever, hemoptysis, sweats, abdominal pain, or weight loss.   He reports hx of rheumatoid arthritis in his legs and takes ibuprofen.  SUBJECTIVE: c/o pain in legs afebrile  VITAL SIGNS: Temp:  [98 F (36.7 C)-98.3 F (36.8 C)] 98.2 F (36.8 C) (02/03 0551) Pulse Rate:  [86-106] 102 (02/03 0551) Resp:  [16-20] 16 (02/03 0551) BP: (92-125)/(70-75) 125/75 mmHg (02/03 0551) SpO2:  [94 %-98 %] 94 % (02/03 0551)  PHYSICAL EXAMINATION: General: no distress Neuro: alert, normal strength, CN intact HEENT: pupils reactive, no sinus tenderness, MP 2, poor dentition, no LAN Cardiovascular: regular Lungs: bronchial breath sounds upper lobes, no wheeze Abdomen: soft, non tender Musculoskeletal: no edema Skin: desquamation of dorsal penis    Recent Labs Lab 09/18/14 1900 09/19/14 0530  NA 138 137  K 5.0 4.5  CL 113* 114*  CO2 18* 18*  BUN 29* 28*  CREATININE 1.55* 1.28  GLUCOSE 116* 108*     Recent Labs Lab 09/18/14 1900 09/19/14 0530  HGB 13.0 12.0*  HCT 38.9* 36.2*  WBC 15.4* 12.2*  PLT 389 393    Ct Abdomen Pelvis W  Contrast  09/20/2014   CLINICAL DATA:  Mediastinal mass.  Evaluate for metastatic disease.  EXAM: CT ABDOMEN AND PELVIS WITH CONTRAST  TECHNIQUE: Multidetector CT imaging of the abdomen and pelvis was performed using the standard protocol following bolus administration of intravenous contrast.  CONTRAST:  19mL OMNIPAQUE IOHEXOL 300 MG/ML  SOLN  COMPARISON:  Outside CT with 09/17/2014.  FINDINGS: Lower chest: Diffuse ground-glass opacities in lower lobes. Bibasilar atelectasis.  Hepatobiliary: Single low-density lesion in the posterior aspect the right hepatic lobe measures 6 mm on image 32 is too small characterize. No biliary duct dilatation. Gallbladder is normal.  Pancreas: Multilobular cystic lesions in the uncinate region of the pancreas with measuring approximately 21 x 25 mm on coronal image 42. There is calcifications within the uncinate process additionally. Main pancreatic duct is prominent within normal limits at 2 mm. The tail of pancreas is normal.  Spleen: Normal spleen.  Adrenals/urinary tract: Normal adrenal glands and kidneys. The ureters and bladder normal.  Stomach/Bowel: The stomach, small bowel, colon are unremarkable.  Vascular/Lymphatic: There is severe circumferential narrowing of the left common iliac artery (image 52). This artery does reconstitute.  No retroperitoneal periportal lymphadenopathy no central mesenteric adenopathy. No pelvic.  Reproductive: Asymmetric thickening along the peripheral zone of the prostate gland on the left (image 70).  Musculoskeletal: Severe degenerative changes hips remodeling of femoral heads with extensive sub chondral cystic change.  There are smudgy sclerotic lesions throughout skeleton. Lesions in left and  right inferior pubic rami on image 79 and 7 7. Multiple lesions to the sacrum iliac bones. Lesions in the proximal left femur. Example lesions inn left iliac wing on image 60 measures 25 mm). Multiple lesions throughout lumbar spine hand ribs. Example  lesion at T11 measures 20 mm  Other: Left inguinal hernia small amount fluid.  IMPRESSION: 1. Widespread skeletal metastasis in the pelvis, femurs, spine, and ribs. 2. Asymmetric thickening of the left aspect of prostate gland peripheral zone could represent prostate carcinoma if no primary discovered. 3. Cystic lesion within the uncinate process of pancreas. Recommend correlation with CA 19 none and followup MRI of the pancreas with contrast in no primary discovered. 4. Small hypodense lesion within the liver cannot be fully characterized. 5. Severe atherosclerotic narrowing of the left common iliac artery with reconstitution.   Electronically Signed   By: Suzy Bouchard M.D.   On: 09/20/2014 18:06    ASSESSMENT / PLAN:  54 yo male smoker with RUL and mediastinal mass >> paratracheal & mild subcarinal lymphadenopathy main concern is for malignancy. Plan: Await cytology results-needle aspiration of paratracheal and subcarinal lymph node stations were performed. If cytology negative, he will need to be scheduled for endobronchial ultrasound guided needle aspiration-this can be performed next week -  Hx of smoking with changes of emphysema on CT chest. Plan: - will need PFT's as outpt  ?Hx of Rheumatoid arthritis. Plan: - per primary team  AKI >> resolved.    Rigoberto Noel MD 09/22/2014, 12:36 PM

## 2014-09-23 ENCOUNTER — Encounter (HOSPITAL_COMMUNITY)
Admission: AD | Disposition: A | Discharge status: Skilled Nursing Facility | Payer: Self-pay | Source: Other Acute Inpatient Hospital | Attending: Internal Medicine

## 2014-09-23 ENCOUNTER — Encounter (HOSPITAL_COMMUNITY): Payer: Self-pay | Admitting: Anesthesiology

## 2014-09-23 ENCOUNTER — Inpatient Hospital Stay (HOSPITAL_COMMUNITY): Payer: Medicare Other | Admitting: Certified Registered Nurse Anesthetist

## 2014-09-23 DIAGNOSIS — R5381 Other malaise: Secondary | ICD-10-CM | POA: Diagnosis present

## 2014-09-23 DIAGNOSIS — J9601 Acute respiratory failure with hypoxia: Secondary | ICD-10-CM | POA: Diagnosis present

## 2014-09-23 HISTORY — PX: VIDEO BRONCHOSCOPY WITH ENDOBRONCHIAL ULTRASOUND: SHX6177

## 2014-09-23 LAB — CULTURE, BAL-QUANTITATIVE W GRAM STAIN

## 2014-09-23 LAB — CULTURE, BAL-QUANTITATIVE

## 2014-09-23 SURGERY — BRONCHOSCOPY, WITH EBUS
Anesthesia: General

## 2014-09-23 MED ORDER — ROCURONIUM BROMIDE 50 MG/5ML IV SOLN
INTRAVENOUS | Status: AC
  Filled 2014-09-23: qty 2

## 2014-09-23 MED ORDER — PROPOFOL 10 MG/ML IV BOLUS
INTRAVENOUS | Status: DC | PRN
  Administered 2014-09-23: 160 mg via INTRAVENOUS

## 2014-09-23 MED ORDER — FENTANYL CITRATE 0.05 MG/ML IJ SOLN
INTRAMUSCULAR | Status: AC
  Filled 2014-09-23: qty 5

## 2014-09-23 MED ORDER — LIDOCAINE HCL (CARDIAC) 20 MG/ML IV SOLN
INTRAVENOUS | Status: DC | PRN
  Administered 2014-09-23: 100 mg via INTRAVENOUS

## 2014-09-23 MED ORDER — HYDROMORPHONE HCL 1 MG/ML IJ SOLN: 0.2500 mg | INTRAMUSCULAR | Status: DC | PRN

## 2014-09-23 MED ORDER — LIDOCAINE HCL 4 % MT SOLN
OROMUCOSAL | Status: DC | PRN
  Administered 2014-09-23: 4 mL via TOPICAL

## 2014-09-23 MED ORDER — GLYCOPYRROLATE 0.2 MG/ML IJ SOLN
INTRAMUSCULAR | Status: DC | PRN
  Administered 2014-09-23: .4 mg via INTRAVENOUS

## 2014-09-23 MED ORDER — PHENYLEPHRINE 40 MCG/ML (10ML) SYRINGE FOR IV PUSH (FOR BLOOD PRESSURE SUPPORT)
PREFILLED_SYRINGE | INTRAVENOUS | Status: AC
  Filled 2014-09-23: qty 20

## 2014-09-23 MED ORDER — ROCURONIUM BROMIDE 100 MG/10ML IV SOLN
INTRAVENOUS | Status: DC | PRN
  Administered 2014-09-23: 25 mg via INTRAVENOUS

## 2014-09-23 MED ORDER — ONDANSETRON HCL 4 MG/2ML IJ SOLN: 4.0000 mg | Freq: Once | INTRAMUSCULAR | Status: DC | PRN

## 2014-09-23 MED ORDER — LACTATED RINGERS IV SOLN
INTRAVENOUS | Status: DC | PRN
  Administered 2014-09-23: 08:00:00 via INTRAVENOUS

## 2014-09-23 MED ORDER — DEXAMETHASONE SODIUM PHOSPHATE 4 MG/ML IJ SOLN
INTRAMUSCULAR | Status: DC | PRN
  Administered 2014-09-23: 4 mg via INTRAVENOUS

## 2014-09-23 MED ORDER — GLYCOPYRROLATE 0.2 MG/ML IJ SOLN
INTRAMUSCULAR | Status: AC
  Filled 2014-09-23: qty 2

## 2014-09-23 MED ORDER — NEOSTIGMINE METHYLSULFATE 10 MG/10ML IV SOLN
INTRAVENOUS | Status: DC | PRN
  Administered 2014-09-23: 3 mg via INTRAVENOUS

## 2014-09-23 MED ORDER — EPHEDRINE SULFATE 50 MG/ML IJ SOLN
INTRAMUSCULAR | Status: AC
  Filled 2014-09-23: qty 1

## 2014-09-23 MED ORDER — ONDANSETRON HCL 4 MG/2ML IJ SOLN
INTRAMUSCULAR | Status: AC
Start: 2014-09-23 — End: 2014-09-23
  Filled 2014-09-23: qty 4

## 2014-09-23 MED ORDER — PROPOFOL 10 MG/ML IV BOLUS
INTRAVENOUS | Status: AC
  Filled 2014-09-23: qty 20

## 2014-09-23 MED ORDER — MIDAZOLAM HCL 2 MG/2ML IJ SOLN
INTRAMUSCULAR | Status: AC
  Filled 2014-09-23: qty 2

## 2014-09-23 MED ORDER — SODIUM CHLORIDE 0.9 % IJ SOLN
INTRAMUSCULAR | Status: AC
  Filled 2014-09-23: qty 10

## 2014-09-23 MED ORDER — PHENYLEPHRINE HCL 10 MG/ML IJ SOLN
INTRAMUSCULAR | Status: DC | PRN
  Administered 2014-09-23: 80 ug via INTRAVENOUS

## 2014-09-23 MED ORDER — DEXAMETHASONE SODIUM PHOSPHATE 4 MG/ML IJ SOLN
INTRAMUSCULAR | Status: AC
  Filled 2014-09-23: qty 1

## 2014-09-23 MED ORDER — 0.9 % SODIUM CHLORIDE (POUR BTL) OPTIME
TOPICAL | Status: DC | PRN
  Administered 2014-09-23: 1000 mL

## 2014-09-23 MED ORDER — SUCCINYLCHOLINE CHLORIDE 20 MG/ML IJ SOLN
INTRAMUSCULAR | Status: AC
  Filled 2014-09-23: qty 1

## 2014-09-23 MED ORDER — LIDOCAINE HCL (CARDIAC) 20 MG/ML IV SOLN
INTRAVENOUS | Status: AC
  Filled 2014-09-23: qty 20

## 2014-09-23 MED ORDER — SUCCINYLCHOLINE CHLORIDE 20 MG/ML IJ SOLN
INTRAMUSCULAR | Status: DC | PRN
  Administered 2014-09-23: 110 mg via INTRAVENOUS

## 2014-09-23 MED ORDER — PHENYLEPHRINE HCL 10 MG/ML IJ SOLN
10.0000 mg | INTRAVENOUS | Status: DC | PRN
  Administered 2014-09-23: 10 ug/min via INTRAVENOUS

## 2014-09-23 MED ORDER — NEOSTIGMINE METHYLSULFATE 10 MG/10ML IV SOLN
INTRAVENOUS | Status: AC
  Filled 2014-09-23: qty 1

## 2014-09-23 MED ORDER — ONDANSETRON HCL 4 MG/2ML IJ SOLN
INTRAMUSCULAR | Status: DC | PRN
  Administered 2014-09-23: 4 mg via INTRAVENOUS

## 2014-09-23 SURGICAL SUPPLY — 22 items
BRUSH CYTOL CELLEBRITY 1.5X140 (MISCELLANEOUS) IMPLANT
CANISTER SUCTION 2500CC (MISCELLANEOUS) ×3 IMPLANT
CONT SPEC 4OZ CLIKSEAL STRL BL (MISCELLANEOUS) ×3 IMPLANT
COVER TABLE BACK 60X90 (DRAPES) ×3 IMPLANT
FORCEPS BIOP RJ4 1.8 (CUTTING FORCEPS) IMPLANT
GAUZE SPONGE 4X4 12PLY STRL (GAUZE/BANDAGES/DRESSINGS) IMPLANT
GLOVE SURG SIGNA 7.5 PF LTX (GLOVE) ×3 IMPLANT
KIT CLEAN ENDO COMPLIANCE (KITS) ×3 IMPLANT
KIT ROOM TURNOVER OR (KITS) ×3 IMPLANT
MARKER SKIN DUAL TIP RULER LAB (MISCELLANEOUS) ×3 IMPLANT
NEEDLE BIOPSY TRANSBRONCH 21G (NEEDLE) IMPLANT
NEEDLE SYS SONOTIP II EBUSTBNA (NEEDLE) IMPLANT
NS IRRIG 1000ML POUR BTL (IV SOLUTION) ×3 IMPLANT
OIL SILICONE PENTAX (PARTS (SERVICE/REPAIRS)) IMPLANT
PAD ARMBOARD 7.5X6 YLW CONV (MISCELLANEOUS) ×6 IMPLANT
SYR 20CC LL (SYRINGE) ×3 IMPLANT
SYR 20ML ECCENTRIC (SYRINGE) ×6 IMPLANT
SYR 5ML LUER SLIP (SYRINGE) ×3 IMPLANT
TOWEL OR 17X24 6PK STRL BLUE (TOWEL DISPOSABLE) ×3 IMPLANT
TRAP SPECIMEN MUCOUS 40CC (MISCELLANEOUS) ×3 IMPLANT
TUBE CONNECTING 20'X1/4 (TUBING) ×1
TUBE CONNECTING 20X1/4 (TUBING) ×2 IMPLANT

## 2014-09-23 NOTE — Evaluation (Signed)
Physical Therapy Evaluation Patient Details Name: Elihu Milstein MRN: [id number] DOB: [birth year] Today's Date: 09/23/2014   History of Present Illness  Patient is a 54 y/o male admitted due to worsening SOB over the past 2 weeks. CT chest angiogram- positive for a large right upper lobe mass encasing the right pulmonary artery and encroaching onto the carina of the trachea.  s/p VIDEO BRONCHOSCOPY WITH ENDOBRONCHIAL ULTRASOUND on 2/4.    Clinical Impression  Patient presents with functional limitations due to deficits listed in PT problem list (see below). Pt with generalized weakness, deconditioning, dyspnea on exertion and impaired endurance. Pt with decrease in Sa02 on RA with activity. At this time, pt requires supplemental 02 with mobility. Education on pursed lip breathing. Family inquiring about getting pt more assist at home [facility]) if disposition is home although pt not safe to return home at this time. Might benefit from RW to assist with energy conservation and balance. Will follow acutely to assess need for 02, balance and overall safe mobility to determine appropriate discharge needs and improve independence.    Follow Up Recommendations SNF;Supervision/Assistance - 24 hour (but pt wants to go home if possible. Discussing plan with family.)    Equipment Recommendations  Other (comment) (TBD)    Recommendations for Other Services       Precautions / Restrictions Precautions Precautions: Fall Precaution Comments: monitor 02 Restrictions Weight Bearing Restrictions: No      Mobility  Bed Mobility Overal bed mobility: Needs Assistance Bed Mobility: Supine to Sit     Supine to sit: Modified independent (Device/Increase time);HOB elevated        Transfers Overall transfer level: Needs assistance Equipment used: Straight cane Transfers: Sit to/from Stand Sit to Stand: Min guard         General transfer comment: Min guard for safety due to  unsteadiness.  Ambulation/Gait Ambulation/Gait assistance: Min assist Ambulation Distance (Feet): 50 Feet Assistive device: Straight cane Gait Pattern/deviations: Step-to pattern;Decreased stride length;Trunk flexed   Gait velocity interpretation: Below normal speed for age/gender General Gait Details: Pt with slow, unsteady gait with multiple standing rest breaks due to dyspnea and fatigue. Min A for balance. Ambulated first 91' on RA, Sa02 dropped to 81%. Donned 2L 02 [place]. Sa02 remained 91% duration of ambulation bout. 3/4 dyspnea.  Stairs            Wheelchair Mobility    Modified Rankin (Stroke Patients Only)       Balance Overall balance assessment: Needs assistance Sitting-balance support: Feet supported;No upper extremity supported Sitting balance-Leahy Scale: Fair Sitting balance - Comments: Required total A to donn socks.   Standing balance support: During functional activity Standing balance-Leahy Scale: Poor Standing balance comment: Requires BUE support for balance/safety.                              Pertinent Vitals/Pain Pain Assessment: 0-10 Pain Score:  (not rated on pain scale.) Pain Location: under armpit on right Pain Descriptors / Indicators: Sore Pain Intervention(s): Monitored during session;Repositioned    Home Living Family/patient expects to be discharged to:: Private residence Living Arrangements: Alone Available Help at Discharge: Family;Available PRN/intermittently Type of Home: Apartment Home Access: Stairs to enter Entrance Stairs-Rails: Right Entrance Stairs-Number of Steps: 4 Home Layout: One level Home Equipment: Cane - single point      Prior Function Level of Independence: Independent with assistive device(s)  Hand Dominance        Extremity/Trunk Assessment   Upper Extremity Assessment: Defer to OT evaluation           Lower Extremity Assessment: Generalized weakness          Communication   Communication: No difficulties  Cognition Arousal/Alertness: Awake/alert Behavior During Therapy: WFL for tasks assessed/performed Overall Cognitive Status: Within Functional Limits for tasks assessed                      General Comments General comments (skin integrity, edema, etc.): Education provided on disposition and possible need for [facility] SNF or 24/7 supervision/assist. Patient and pt;s sister requesting more information on services if pt were to d/c home (aide, etc).    Exercises        Assessment/Plan    PT Assessment Patient needs continued PT services  PT Diagnosis Difficulty walking;Generalized weakness   PT Problem List Decreased strength;Cardiopulmonary status limiting activity;Pain;Decreased activity tolerance;Decreased balance;Decreased mobility;Decreased safety awareness  PT Treatment Interventions Balance training;Gait training;Patient/family education;Functional mobility training;Therapeutic activities;Therapeutic exercise;Stair training   PT Goals (Current goals can be found in the Care Plan section) Acute Rehab PT Goals Patient Stated Goal: to be able to breathe PT Goal Formulation: With patient Time For Goal Achievement: 10/07/14 Potential to Achieve Goals: Fair    Frequency Min 3X/week   Barriers to discharge Decreased caregiver support Pt lives alone.    Co-evaluation               End of Session Equipment Utilized During Treatment: Gait belt;Oxygen Activity Tolerance: Treatment limited secondary to medical complications (Comment) (drop in Sa02, dyspnea) Patient left: in bed;with call bell/phone within reach;with family/visitor present (sitting EOB.) Nurse Communication: Mobility status         Time: 0601-5615 PT Time Calculation (min) (ACUTE ONLY): 22 min   Charges:   PT Evaluation $Initial PT Evaluation Tier I: 1 Procedure     PT G CodesCandy Sledge A 09-26-14, 4:08 PM Candy Sledge, [place],  DPT [phone]

## 2014-09-23 NOTE — Interval H&P Note (Signed)
Francisco Love has presented today for procedure, with the diagnosis of mediastinal lymphadenopathy. The various methods of treatment have been discussed with the patient and family. After consideration of risks, benefits and other options for treatment, the patient has consented to Procedure(s) :  EBUS with biopsy .  The patient's history has been reviewed, patient examined, no change in status, stable for surgery. I have reviewed the patient's chart and labs. Questions were answered to the patient's satisfaction

## 2014-09-23 NOTE — Anesthesia Postprocedure Evaluation (Signed)
  Anesthesia Post-op Note  Patient: Francisco Love  Procedure(s) Performed: Procedure(s): VIDEO BRONCHOSCOPY WITH ENDOBRONCHIAL ULTRASOUND (N/A)  Patient Location: PACU  Anesthesia Type:General  Level of Consciousness: awake, alert , oriented and patient cooperative  Airway and Oxygen Therapy: Patient Spontanous Breathing  Post-op Pain: none  Post-op Assessment: Post-op Vital signs reviewed, Patient's Cardiovascular Status Stable, Respiratory Function Stable, Patent Airway, No signs of Nausea or vomiting and Pain level controlled  Post-op Vital Signs: stable  Last Vitals:  Filed Vitals:   09/23/14 0908  BP: 94/64  Pulse:   Temp: 36.5 C  Resp: 19    Complications: No apparent anesthesia complications

## 2014-09-23 NOTE — H&P (View-Only) (Signed)
Name: Francisco Love MRN: [id number] DOB: 05/20/1961    ADMISSION DATE:  09/18/2014 CONSULTATION DATE:  09/20/2014  REFERRING MD :  [facility]  CHIEF COMPLAINT:  Lung mass  BRIEF PATIENT DESCRIPTION:  54 yo male former smoker with progressive dyspnea.  He had recent CT chest as outpt which showed lung mass.  SIGNIFICANT EVENTS  1/30 admit  STUDIES:  1/29 CT chest (OSH) >> RUL/mediastinal mass, emphysema   HISTORY OF PRESENT ILLNESS:   54 yo male presented to OSH with progressive dyspnea.  He has extensive hx of smoking, and quit few months ago.  He had CXR which showed fullness, and then had CT chest.  He was transferred to [facility] for further assessment.  Unfortunately his CT chest was not available for review until today, and pulmonary consult was then done.  He denies headache, blurred vision, hoarseness, dysphagia, gland swelling, chest pain, fever, hemoptysis, sweats, abdominal pain, or weight loss.   He reports hx of rheumatoid arthritis in his legs and takes ibuprofen.  SUBJECTIVE: c/o pain in legs afebrile  VITAL SIGNS: Temp:  [98 F (36.7 C)-98.3 F (36.8 C)] 98.2 F (36.8 C) (02/03 0551) Pulse Rate:  [86-106] 102 (02/03 0551) Resp:  [16-20] 16 (02/03 0551) BP: (92-125)/(70-75) 125/75 mmHg (02/03 0551) SpO2:  [94 %-98 %] 94 % (02/03 0551)  PHYSICAL EXAMINATION: General: no distress Neuro: alert, normal strength, CN intact HEENT: pupils reactive, no sinus tenderness, MP 2, poor dentition, no LAN Cardiovascular: regular Lungs: bronchial breath sounds upper lobes, no wheeze Abdomen: soft, non tender Musculoskeletal: no edema Skin: desquamation of dorsal penis    Recent Labs Lab 09/18/14 1900 09/19/14 0530  NA 138 137  K 5.0 4.5  CL 113* 114*  CO2 18* 18*  BUN 29* 28*  CREATININE 1.55* 1.28  GLUCOSE 116* 108*     Recent Labs Lab 09/18/14 1900 09/19/14 0530  HGB 13.0 12.0*  HCT 38.9* 36.2*  WBC 15.4* 12.2*  PLT 389 393    Ct Abdomen Pelvis W  Contrast  09/20/2014   CLINICAL DATA:  Mediastinal mass.  Evaluate for metastatic disease.  EXAM: CT ABDOMEN AND PELVIS WITH CONTRAST  TECHNIQUE: Multidetector CT imaging of the abdomen and pelvis was performed using the standard protocol following bolus administration of intravenous contrast.  CONTRAST:  164mL OMNIPAQUE IOHEXOL 300 MG/ML  SOLN  COMPARISON:  Outside CT with 09/17/2014.  FINDINGS: Lower chest: Diffuse ground-glass opacities in lower lobes. Bibasilar atelectasis.  Hepatobiliary: Single low-density lesion in the posterior aspect the right hepatic lobe measures 6 mm on image 32 is too small characterize. No biliary duct dilatation. Gallbladder is normal.  Pancreas: Multilobular cystic lesions in the uncinate region of the pancreas with measuring approximately 21 x 25 mm on coronal image 42. There is calcifications within the uncinate process additionally. Main pancreatic duct is prominent within normal limits at 2 mm. The tail of pancreas is normal.  Spleen: Normal spleen.  Adrenals/urinary tract: Normal adrenal glands and kidneys. The ureters and bladder normal.  Stomach/Bowel: The stomach, small bowel, colon are unremarkable.  Vascular/Lymphatic: There is severe circumferential narrowing of the left common iliac artery (image 52). This artery does reconstitute.  No retroperitoneal periportal lymphadenopathy no central mesenteric adenopathy. No pelvic.  Reproductive: Asymmetric thickening along the peripheral zone of the prostate gland on the left (image 70).  Musculoskeletal: Severe degenerative changes hips remodeling of femoral heads with extensive sub chondral cystic change.  There are smudgy sclerotic lesions throughout skeleton. Lesions in left and  right inferior pubic rami on image 79 and 7 7. Multiple lesions to the sacrum iliac bones. Lesions in the proximal left femur. Example lesions inn left iliac wing on image 60 measures 25 mm). Multiple lesions throughout lumbar spine hand ribs. Example  lesion at T11 measures 20 mm  Other: Left inguinal hernia small amount fluid.  IMPRESSION: 1. Widespread skeletal metastasis in the pelvis, femurs, spine, and ribs. 2. Asymmetric thickening of the left aspect of prostate gland peripheral zone could represent prostate carcinoma if no primary discovered. 3. Cystic lesion within the uncinate process of pancreas. Recommend correlation with CA 19 none and followup MRI of the pancreas with contrast in no primary discovered. 4. Small hypodense lesion within the liver cannot be fully characterized. 5. Severe atherosclerotic narrowing of the left common iliac artery with reconstitution.   Electronically Signed   By: Suzy Bouchard M.D.   On: 09/20/2014 18:06    ASSESSMENT / PLAN:  54 yo male smoker with RUL and mediastinal mass >> paratracheal & mild subcarinal lymphadenopathy main concern is for malignancy. Plan: Await cytology results-needle aspiration of paratracheal and subcarinal lymph node stations were performed. If cytology negative, he will need to be scheduled for endobronchial ultrasound guided needle aspiration-this can be performed next week -  Hx of smoking with changes of emphysema on CT chest. Plan: - will need PFT's as outpt  ?Hx of Rheumatoid arthritis. Plan: - per primary team  AKI >> resolved.    Rigoberto Noel MD 09/22/2014, 12:36 PM

## 2014-09-23 NOTE — Anesthesia Preprocedure Evaluation (Addendum)
Anesthesia Evaluation  Patient identified by MRN, date of birth, ID band Patient awake    Reviewed: Allergy & Precautions, NPO status , Patient's Chart, lab work & pertinent test results  Airway Mallampati: II  TM Distance: >3 FB Neck ROM: Full    Dental  (+) Teeth Intact, Dental Advisory Given,    Pulmonary shortness of breath, former smoker,          Cardiovascular     Neuro/Psych    GI/Hepatic   Endo/Other    Renal/GU      Musculoskeletal   Abdominal   Peds  Hematology   Anesthesia Other Findings Lung mass  Reproductive/Obstetrics                            Anesthesia Physical Anesthesia Plan  ASA: II  Anesthesia Plan: General   Post-op Pain Management:    Induction: Intravenous  Airway Management Planned: Oral ETT  Additional Equipment:   Intra-op Plan:   Post-operative Plan: Extubation in OR  Informed Consent: I have reviewed the patients History and Physical, chart, labs and discussed the procedure including the risks, benefits and alternatives for the proposed anesthesia with the patient or authorized representative who has indicated his/her understanding and acceptance.     Plan Discussed with: CRNA, Anesthesiologist and Surgeon  Anesthesia Plan Comments:         Anesthesia Quick Evaluation

## 2014-09-23 NOTE — Progress Notes (Signed)
Late entry  [facility] PROGRESS NOTE  Ansar Skoda OZD:[id number] DOB: 1961-04-01 DOA: 09/18/2014 PCP: [name], [name], MD  Brief narrative 54 year old male with history of tobacco abuse who presented with worsening shortness of breath for the past 2 weeks. He went to ED in [place] , [place] where a CT angiogram of the chest was done which was negative for PE but showed a large right upper lobe mass encasing right pulmonary artery and encroaching onto the carina. Patient was then transferred to [facility] for further management. Pulmonary consulted. CT abd and pelvis shows diffuse skeletal metastasis., cystic lesion on pancreas and  Asymmetric thickening of prostate.   Assessment/Plan: Lung mass Likely malignant, primary vs metastatic.  CT angio done at outside hospital uploaded into the system.  Bronchoscopy nondiagnostic. For endobronchial ultrasound guided transbronchial needle aspiration tomorrow  Diffuse skeletal metastasis, possible pancreatic and prostatic lesion  as seen on CT Markedly elevated ALKP due to bony mets.  CA 19-9 negative  Acute hypoxic respiratory failure Secondary to the lung mass. Check ambulatory pulse ox. May need home oxygen  Penile lesion Needs dermatology evaluation as outpt  Acute kidney injury Seen on admission. Resolved.  Tobacco abuse Patient reports quitting smoking for past 4-5 months.  Debility Lives alone in [place] and has hard time caring for self. Will get PT eval . May need placement   Diet: Regular  DVT prophylaxis: Subcutaneous heparin     Code Status: Full code Family Communication: None at bedside Disposition Plan:    Consultants:  Pulmonary  Procedures:  None  Antibiotics:  None  HPI/Subjective: C/o DOE. Weakness. Has lost weight   Objective: Filed Vitals:   09/23/14 1613  BP: 95/71  Pulse: 106  Temp: 97.7 F (36.5 C)  Resp: 22    Intake/Output Summary (Last 24 hours) at  09/23/14 1739 Last data filed at 09/23/14 0901  Gross per 24 hour  Intake    900 ml  Output    652 ml  Net    248 ml   Filed Weights   09/18/14 1515 09/21/14 0754  Weight: 65.772 kg (145 lb) 63.504 kg (140 lb)    Exam:   General:   Chronically ill appearing  HEENT: bitemporal wasting  Cardiovascular: normal S1 and S2, no murmurs rub or gallop  Respiratory: clear  to auscultation bilaterally, no added sounds  GI: Soft, nondistended, NT Ext: Warm, no edema, no clubbing  Data Reviewed: Basic Metabolic Panel:  Recent Labs Lab 09/18/14 1900 09/19/14 0530  NA 138 137  K 5.0 4.5  CL 113* 114*  CO2 18* 18*  GLUCOSE 116* 108*  BUN 29* 28*  CREATININE 1.55* 1.28  CALCIUM 8.1* 8.1*   Liver Function Tests:  Recent Labs Lab 09/18/14 1900 09/20/14 1120  AST 40* 29  ALT 34 34  ALKPHOS 988* 882*  BILITOT 0.3 0.1*  PROT 6.7 6.5  ALBUMIN 2.3* 2.3*   No results for input(s): LIPASE, AMYLASE in the last 168 hours. No results for input(s): AMMONIA in the last 168 hours. CBC:  Recent Labs Lab 09/18/14 1900 09/19/14 0530  WBC 15.4* 12.2*  HGB 13.0 12.0*  HCT 38.9* 36.2*  MCV 83.3 83.4  PLT 389 393   Cardiac Enzymes: No results for input(s): CKTOTAL, CKMB, CKMBINDEX, TROPONINI in the last 168 hours. BNP (last 3 results) No results for input(s): PROBNP in the last 8760 hours. CBG: No results for input(s): GLUCAP in the last 168 hours.  Recent Results (from the  past 240 hour(s))  AFB culture with smear     Status: None (Preliminary result)   Collection Time: 09/21/14  9:01 AM  Result Value Ref Range Status   Specimen Description BRONCHIAL ALVEOLAR LAVAGE  Final   Special Requests NONE  Final   Acid Fast Smear   Final    NO ACID FAST BACILLI SEEN Performed at [facility]    Culture   Final    CULTURE WILL BE EXAMINED FOR 6 WEEKS BEFORE ISSUING A FINAL REPORT Performed at [facility]    Report Status PENDING  Incomplete  Culture,  bal-quantitative     Status: None   Collection Time: 09/21/14  9:01 AM  Result Value Ref Range Status   Specimen Description BRONCHIAL ALVEOLAR LAVAGE  Final   Special Requests NONE  Final   Gram Stain   Final    RARE WBC PRESENT, PREDOMINANTLY MONONUCLEAR NO SQUAMOUS EPITHELIAL CELLS SEEN NO ORGANISMS SEEN Performed at [place]   Final    >=100,000 COLONIES/ML Performed at [facility]    Culture   Final    Non-Pathogenic Oropharyngeal-type Flora Isolated. Performed at [facility]    Report Status 09/23/2014 FINAL  Final     Studies: No results found.  Scheduled Meds: . hydrocortisone cream   Topical BID  . ipratropium-albuterol  3 mL Nebulization TID  . sodium chloride  3 mL Intravenous Q12H   Continuous Infusions:   Time spent: 25 minutes  [facility] [email or web address], password [facility] 09/23/2014, 5:39 PM  LOS: 5 days

## 2014-09-23 NOTE — Progress Notes (Signed)
PT Cancellation Note  Patient Details Name: Francisco Love MRN: [id number] DOB: [birth year]   Cancelled Treatment:    Reason Eval/Treat Not Completed: Patient at procedure or test/unavailable Pt in OR this AM. Will follow up next available time to perform PT evaluation.   Candy Sledge A 09/23/2014, 8:56 AM Candy Sledge, PT, DPT [phone]

## 2014-09-23 NOTE — Op Note (Signed)
  Name:  [name] MRN:  [id number] DOB:  1960/11/05  PROCEDURE NOTE  Procedure(s): Flexible bronchoscopy [phone] Endobronchial ultrasound ([id number]) Transbronchial needle aspiration (92446) of the paratracheal LN 4R Transbronchial needle aspiration, additional lobe (28638) of the subcarinal LN station 7  Indications:  mediastinal lymphadenopathy.  Consent:  Written informed consent was obtained prior to the procedure. The risks of the procedure including coughing, bleeding and the small chance of lung puncture requiring chest tube were discussed in great detail. The benefits & alternatives including serial follow up were also discussed.  Anesthesia:  General endotracheal.  Procedure summary:  Appropriate equipment was assembled.  The patient was  identified as Francisco Love. Interim history obtained and brought to the operating room. Safety timeout was performed. The patient was placed supine on the operating table, airway established and general anesthesia administered by Anesthesia team.   After the appropriate level of anesthesia was assured, flexible video bronchoscope was lubricated and inserted through the endotracheal tube.    Airway examination was performed bilaterally to subsegmental level.  Minimal clear secretions were noted, mucosa appeared normal and no endobronchial lesions were identified.  Endobronchial ultrasound video bronchoscope was then lubricated and inserted through the endotracheal tube. Surveillance of the mediastinal and and bilateral hilar lymph node stations was performed.  Pathologically enlarged lymph nodes were noted at stations 4R & 7.  Endobronchial ultrasound guided transbronchial needle aspiration of 4R  (passes 6), station 7 (passes 4) was performed, after which EBUS bronchoscope was withdrawn.  Flexible video bronchoscope was used again to perform random endobronchial mucosal biopsies.  After ensuring hemostasis , the bronchoscope was  withdrawn.  The patient was extubated in operating room and transferred to PACU.   Specimens sent: TBNA specimens for cytology  Complications:  No immediate complications were noted.  Hemodynamic parameters and oxygenation remained stable throughout the procedure.  Estimated blood loss:  Less then 5 mL.   Kara Mead MD. Shade Flood. [facility] Pager [phone] If no response call [phone]   09/23/2014 9:04 AM

## 2014-09-23 NOTE — Progress Notes (Addendum)
[facility] PROGRESS NOTE  Hayk Divis HDQ:[id number] DOB: 07/27/1961 DOA: 09/18/2014 PCP: [name], [name], MD  Brief narrative 54 year old male with history of tobacco abuse who presented with worsening shortness of breath for the past 2 weeks. He went to ED in [place] , [place] where a CT angiogram of the chest was done which was negative for PE but showed a large right upper lobe mass encasing right pulmonary artery and encroaching onto the carina. Patient was then transferred to [facility] for further management. Pulmonary consulted. CT abd and pelvis shows diffuse skeletal metastasis., cystic lesion on pancreas and  Asymmetric thickening of prostate.   Assessment/Plan: Lung mass Likely malignant, primary vs metastatic.   Bronchoscopy nondiagnostic. For endobronchial ultrasound guided transbronchial needle aspiration today. Path pending  Diffuse skeletal metastasis, possible pancreatic and prostatic lesion  as seen on CT Markedly elevated ALKP due to bony mets.  CA 19-9 negative  Acute hypoxic respiratory failure sats dropped to 81% on RA. Will need home oxygen  Penile lesion Needs dermatology evaluation as outpt  Acute kidney injury Seen on admission. Resolved.  Tobacco abuse Patient reports quitting smoking for past 4-5 months.  Debility PT recommending SNF vs 24 hour supervision. Lives alone and family works during the day. Patient willing to go to SNF. Will consult SW.  Code Status: Full code Family Communication: None at bedside Disposition Plan:    Consultants:  Pulmonary  Procedures:  None  Antibiotics:  None  HPI/Subjective: No new complaints   Objective: Filed Vitals:   09/23/14 1613  BP: 95/71  Pulse: 106  Temp: 97.7 F (36.5 C)  Resp: 22    Intake/Output Summary (Last 24 hours) at 09/23/14 1745 Last data filed at 09/23/14 0901  Gross per 24 hour  Intake    900 ml  Output    652 ml  Net    248 ml    Filed Weights   09/18/14 1515 09/21/14 0754  Weight: 65.772 kg (145 lb) 63.504 kg (140 lb)    Exam:   General:   Chronically ill appearing  HEENT: bitemporal wasting  Cardiovascular: normal S1 and S2, no murmurs rub or gallop  Respiratory: clear  to auscultation bilaterally, no added sounds  GI: Soft, nondistended, NT Ext: Warm, no edema, no clubbing  Data Reviewed: Basic Metabolic Panel:  Recent Labs Lab 09/18/14 1900 09/19/14 0530  NA 138 137  K 5.0 4.5  CL 113* 114*  CO2 18* 18*  GLUCOSE 116* 108*  BUN 29* 28*  CREATININE 1.55* 1.28  CALCIUM 8.1* 8.1*   Liver Function Tests:  Recent Labs Lab 09/18/14 1900 09/20/14 1120  AST 40* 29  ALT 34 34  ALKPHOS 988* 882*  BILITOT 0.3 0.1*  PROT 6.7 6.5  ALBUMIN 2.3* 2.3*   No results for input(s): LIPASE, AMYLASE in the last 168 hours. No results for input(s): AMMONIA in the last 168 hours. CBC:  Recent Labs Lab 09/18/14 1900 09/19/14 0530  WBC 15.4* 12.2*  HGB 13.0 12.0*  HCT 38.9* 36.2*  MCV 83.3 83.4  PLT 389 393   Cardiac Enzymes: No results for input(s): CKTOTAL, CKMB, CKMBINDEX, TROPONINI in the last 168 hours. BNP (last 3 results) No results for input(s): PROBNP in the last 8760 hours. CBG: No results for input(s): GLUCAP in the last 168 hours.  Recent Results (from the past 240 hour(s))  AFB culture with smear     Status: None (Preliminary result)   Collection Time: 09/21/14  9:01 AM  Result Value Ref Range Status   Specimen Description BRONCHIAL ALVEOLAR LAVAGE  Final   Special Requests NONE  Final   Acid Fast Smear   Final    NO ACID FAST BACILLI SEEN Performed at [facility]    Culture   Final    CULTURE WILL BE EXAMINED FOR 6 WEEKS BEFORE ISSUING A FINAL REPORT Performed at [facility]    Report Status PENDING  Incomplete  Culture, bal-quantitative     Status: None   Collection Time: 09/21/14  9:01 AM  Result Value Ref Range Status   Specimen Description  BRONCHIAL ALVEOLAR LAVAGE  Final   Special Requests NONE  Final   Gram Stain   Final    RARE WBC PRESENT, PREDOMINANTLY MONONUCLEAR NO SQUAMOUS EPITHELIAL CELLS SEEN NO ORGANISMS SEEN Performed at [place]   Final    >=100,000 COLONIES/ML Performed at [facility]    Culture   Final    Non-Pathogenic Oropharyngeal-type Flora Isolated. Performed at [facility]    Report Status 09/23/2014 FINAL  Final     Studies: No results found.  Scheduled Meds: . hydrocortisone cream   Topical BID  . ipratropium-albuterol  3 mL Nebulization TID  . sodium chloride  3 mL Intravenous Q12H   Continuous Infusions:   Time spent: 25 minutes  [facility] [email or web address], password [facility] 09/23/2014, 5:45 PM  LOS: 5 days

## 2014-09-23 NOTE — Transfer of Care (Signed)
Immediate Anesthesia Transfer of Care Note  Patient: Francisco Love  Procedure(s) Performed: Procedure(s): VIDEO BRONCHOSCOPY WITH ENDOBRONCHIAL ULTRASOUND (N/A)  Patient Location: PACU  Anesthesia Type:General  Level of Consciousness: awake, alert  and oriented  Airway & Oxygen Therapy: Patient Spontanous Breathing and Patient connected to nasal cannula oxygen  Post-op Assessment: Report given to RN and Post -op Vital signs reviewed and stable  Post vital signs: Reviewed and stable  Last Vitals:  Filed Vitals:   09/23/14 0554  BP: 106/71  Pulse: 91  Temp: 36.4 C  Resp: 16    Complications: No apparent anesthesia complications

## 2014-09-23 NOTE — Anesthesia Procedure Notes (Signed)
Procedure Name: Intubation Date/Time: 09/23/2014 8:08 AM Performed by: Garner Nash Pre-anesthesia Checklist: Patient identified, Timeout performed, Emergency Drugs available, Suction available and Patient being monitored Patient Re-evaluated:Patient Re-evaluated prior to inductionOxygen Delivery Method: Circle system utilized Preoxygenation: Pre-oxygenation with 100% oxygen Intubation Type: IV induction Ventilation: Mask ventilation without difficulty Laryngoscope Size: Mac and 4 Grade View: Grade I Tube type: Oral Tube size: 9.0 mm Number of attempts: 1 Airway Equipment and Method: Stylet and LTA kit utilized Placement Confirmation: ETT inserted through vocal cords under direct vision,  breath sounds checked- equal and bilateral,  positive ETCO2 and CO2 detector Secured at: 22 cm Tube secured with: Tape Dental Injury: Teeth and Oropharynx as per pre-operative assessment

## 2014-09-24 MED ORDER — ENSURE COMPLETE PO LIQD: 237.0000 mL | Freq: Three times a day (TID) | ORAL | Status: DC

## 2014-09-24 MED ORDER — GUAIFENESIN-DM 100-10 MG/5ML PO SYRP: 5.0000 mL | ORAL_SOLUTION | ORAL | Status: DC | PRN

## 2014-09-24 MED ORDER — ACETAMINOPHEN 325 MG PO TABS: 650.0000 mg | ORAL_TABLET | Freq: Four times a day (QID) | ORAL | Status: DC | PRN

## 2014-09-24 MED ORDER — ALBUTEROL SULFATE (2.5 MG/3ML) 0.083% IN NEBU: 2.5000 mg | INHALATION_SOLUTION | RESPIRATORY_TRACT | Status: AC | PRN

## 2014-09-24 MED ORDER — OXYCODONE HCL 5 MG PO TABS: 5.0000 mg | ORAL_TABLET | ORAL | Status: DC | PRN

## 2014-09-24 NOTE — Progress Notes (Signed)
Physical Therapy Treatment Patient Details Name: Francisco Love MRN: [id number] DOB: 03/30/1961 Today's Date: 09/24/2014    History of Present Illness Patient is a 54 y/o male admitted due to worsening SOB over the past 2 weeks. CT chest angiogram- positive for a large right upper lobe mass encasing the right pulmonary artery and encroaching onto the carina of the trachea.  s/p VIDEO BRONCHOSCOPY WITH ENDOBRONCHIAL ULTRASOUND on 2/4.    PT Comments    Patient progressing slowly with mobility. Continues to exhibit increased dyspnea on exertion requiring frequent standing breaks and longer seated rest breaks. Education provided on energy conservation and need for RW to assist with energy conservation as well as balance. Pt agreeable to SNF so frequency decreased to twice/week. Pt with + chest pain post ambulation bout. RN notified. Will continue to follow acutely.   Follow Up Recommendations  SNF;Supervision/Assistance - 24 hour     Equipment Recommendations  Other (comment)    Recommendations for Other Services       Precautions / Restrictions Precautions Precautions: Fall Precaution Comments: monitor 02 Restrictions Weight Bearing Restrictions: No    Mobility  Bed Mobility Overal bed mobility: Needs Assistance Bed Mobility: Supine to Sit     Supine to sit: Modified independent (Device/Increase time);HOB elevated        Transfers Overall transfer level: Needs assistance Equipment used: Straight cane Transfers: Sit to/from Stand Sit to Stand: Min guard         General transfer comment: Min guard for safety due to unsteadiness. Stood from [employer],. from chair x2.  Ambulation/Gait Ambulation/Gait assistance: Min assist Ambulation Distance (Feet): 25 Feet (+30') Assistive device: Straight cane;Rolling walker (2 wheeled) Gait Pattern/deviations: Step-to pattern;Step-through pattern;Decreased stride length;Trunk flexed   Gait velocity interpretation: Below normal  speed for age/gender General Gait Details: Pt with slow, unsteady gait when using SPC requiring use of rail for support as well. Mutliple short standing rest breaks. Dyspnea present. 1 forced seated rest break. Sa02 90% on 2L 02 [place]. Cues for pursed lip breathing. Balance improved with use of RW. 3/4 dyspnea. Pt complaining of chest pain. RN notified.   Stairs            Wheelchair Mobility    Modified Rankin (Stroke Patients Only)       Balance Overall balance assessment: Needs assistance Sitting-balance support: Feet supported;No upper extremity supported Sitting balance-Leahy Scale: Fair Sitting balance - Comments: Required total A to donn socks.   Standing balance support: During functional activity Standing balance-Leahy Scale: Poor                      Cognition Arousal/Alertness: Awake/alert Behavior During Therapy: WFL for tasks assessed/performed Overall Cognitive Status: Within Functional Limits for tasks assessed                      Exercises      General Comments        Pertinent Vitals/Pain Pain Assessment: Faces Faces Pain Scale: Hurts even more Pain Location: chest pain - RN notified Pain Descriptors / Indicators: Sore;Aching Pain Intervention(s): Monitored during session;Patient requesting pain meds-RN notified;Repositioned    Home Living                      Prior Function            PT Goals (current goals can now be found in the care plan section) Progress towards PT goals: Progressing toward goals  Frequency  Min 2X/week    PT Plan Current plan remains appropriate;Frequency needs to be updated    Co-evaluation             End of Session Equipment Utilized During Treatment: Gait belt;Oxygen Activity Tolerance: Patient limited by fatigue;Treatment limited secondary to medical complications (Comment) (chest pain) Patient left: in bed;with call bell/phone within reach;with family/visitor present;with  bed alarm set     Time: 3254-9826 PT Time Calculation (min) (ACUTE ONLY): 28 min  Charges:  $Gait Training: 8-22 mins $Therapeutic Exercise: 8-22 mins                    G CodesCandy Sledge A 2014-10-09, 3:41 PM Candy Sledge, [place], DPT [phone]

## 2014-09-24 NOTE — Discharge Summary (Signed)
Physician Discharge Summary  Francisco Love UVO:[id number] DOB: Oct 01, 1960 DOA: 09/18/2014  PCP: [name], [name], MD  Admit date: 09/18/2014 Discharge date: 09/24/2014  Time spent: greater than 30 minutes  Recommendations for Outpatient Follow-up:  1. To SNF 2. Pathology results pending. If nondiagnostic, will need mediastinoscopy v. bx pancreatic lesion. 3. Continue 2 liters [employer] 4. Left Message with Dr. Worthy Flank scheduler to arrange outpatient oncology follow up.  Await call back. Please confirm patient has an appointment.  Discharge Diagnoses:  Principal Problem:   Respiratory failure, acute, likely chronic as well Active Problems:   RUL and mediastinal mass >> paratracheal & mild subcarinal lymphadenopathy and Metastases to bone   Debility Protein calorie malnutrition, moderate Long history of tobacco smoking  Discharge Condition: stable  Filed Weights   09/18/14 1515 09/21/14 0754  Weight: 65.772 kg (145 lb) 63.504 kg (140 lb)    History of present illness/hospital course 54 year old male with history of tobacco abuse who presented with worsening shortness of breath for the past 2 weeks. He went to ED in [place] where a CT angiogram of the chest was done which was negative for PE but showed a large right upper lobe mass encasing right pulmonary artery and encroaching onto the carina, and mediastinal lymphadenopathy Patient was then transferred to [facility] for [facility] management. Pulmonary consulted. CT abd and pelvis shows diffuse skeletal metastasis., cystic lesion on pancreas and Asymmetric thickening of prostate.  Lung mass Likely metastatic lung cancer Initial Bronchoscopy, biopsy nondiagnostic. S/p endobronchial ultrasound guided transbronchial needle aspiration 09/23/14. Path pending.  Dr. Elsworth Soho recommends making appointment with Dr. Earlie Server to f/u.  If biopsies, nondiagnostic, will need mediastinoscopy v. bx of pancreatic lesion.  Diffuse  skeletal metastasis, possible pancreatic and prostatic lesion as seen on CT CA 19-9 not high  Acute hypoxic respiratory failure sats dropped to 81% on RA. Will need home oxygen. Suspect acute on chronic hypoxic respiratory failure  Penile lesion Needs dermatology evaluation as outpt  Acute kidney injury on admission. Resolved with hydration  Tobacco abuse Patient reports quitting smoking for past 4-5 months.  Protein calorie malnutrition: patient reports weight loss of about 20 lbs  Debility PT recommending SNF vs 24 hour supervision. Lives alone and family works during the day. Patient willing to go to SNF. Has a SNF bed in [place] today.  Code Status: Full code  Consultants:  Pulmonary  procedure 09/21/14 by Dr. Elsworth Soho bronchoscopy Trans bronchial needle aspiration x 4 were obtained from station 7 & station 4R. Brushings & BAL was also obtained from the RUL.   09/23/14 by Dr. Elsworth Soho Flexible bronchoscopy [phone])  Endobronchial ultrasound (56387)  Transbronchial needle aspiration (56433) of the paratracheal LN 4R  Transbronchial needle aspiration, additional lobe (29518) of the subcarinal LN station 7  Discharge Exam: Filed Vitals:   09/24/14 0900  BP: 95/67  Pulse: 104  Temp: 97.8 F (36.6 C)  Resp: 22    General: comfortable Cardiovascular: RRR without MGR Respiratory: CTA without WRR   Discharge Instructions   Discharge Instructions    Diet general    Complete by:  As directed      Discharge instructions    Complete by:  As directed   Continue oxygen 2 liters [place]     Walk with assistance    Complete by:  As directed           Current Discharge Medication List    START taking these medications   Details  acetaminophen (TYLENOL)  325 MG tablet Take 2 tablets (650 mg total) by mouth every 6 (six) hours as needed for mild pain (or Fever >/= 101).    albuterol (PROVENTIL) (2.5 MG/3ML) 0.083% nebulizer solution Take 3 mLs (2.5 mg total) by nebulization  every 2 (two) hours as needed for wheezing. Qty: 75 mL, Refills: 12    feeding supplement, ENSURE COMPLETE, (ENSURE COMPLETE) LIQD Take 237 mLs by mouth 3 (three) times daily between meals.    guaiFENesin-dextromethorphan (ROBITUSSIN DM) 100-10 MG/5ML syrup Take 5 mLs by mouth every 4 (four) hours as needed for cough. Qty: 118 mL, Refills: 0    oxyCODONE (OXY IR/ROXICODONE) 5 MG immediate release tablet Take 1 tablet (5 mg total) by mouth every 4 (four) hours as needed for moderate pain. Qty: 20 tablet, Refills: 0      STOP taking these medications     ibuprofen (ADVIL,MOTRIN) 200 MG tablet        No Known Allergies    The results of significant diagnostics from this hospitalization (including imaging, microbiology, ancillary and laboratory) are listed below for reference.    Significant Diagnostic Studies: Ct Abdomen Pelvis W Contrast  09/20/2014   CLINICAL DATA:  Mediastinal mass.  Evaluate for metastatic disease.  EXAM: CT ABDOMEN AND PELVIS WITH CONTRAST  TECHNIQUE: Multidetector CT imaging of the abdomen and pelvis was performed using the standard protocol following bolus administration of intravenous contrast.  CONTRAST:  125mL OMNIPAQUE IOHEXOL 300 MG/ML  SOLN  COMPARISON:  Outside CT with 09/17/2014.  FINDINGS: Lower chest: Diffuse ground-glass opacities in lower lobes. Bibasilar atelectasis.  Hepatobiliary: Single low-density lesion in the posterior aspect the right hepatic lobe measures 6 mm on image 32 is too small characterize. No biliary duct dilatation. Gallbladder is normal.  Pancreas: Multilobular cystic lesions in the uncinate region of the pancreas with measuring approximately 21 x 25 mm on coronal image 42. There is calcifications within the uncinate process additionally. Main pancreatic duct is prominent within normal limits at 2 mm. The tail of pancreas is normal.  Spleen: Normal spleen.  Adrenals/urinary tract: Normal adrenal glands and kidneys. The ureters and bladder  normal.  Stomach/Bowel: The stomach, small bowel, colon are unremarkable.  Vascular/Lymphatic: There is severe circumferential narrowing of the left common iliac artery (image 52). This artery does reconstitute.  No retroperitoneal periportal lymphadenopathy no central mesenteric adenopathy. No pelvic.  Reproductive: Asymmetric thickening along the peripheral zone of the prostate gland on the left (image 70).  Musculoskeletal: Severe degenerative changes hips remodeling of femoral heads with extensive sub chondral cystic change.  There are smudgy sclerotic lesions throughout skeleton. Lesions in left and right inferior pubic rami on image 79 and 7 7. Multiple lesions to the sacrum iliac bones. Lesions in the proximal left femur. Example lesions inn left iliac wing on image 60 measures 25 mm). Multiple lesions throughout lumbar spine hand ribs. Example lesion at T11 measures 20 mm  Other: Left inguinal hernia small amount fluid.  IMPRESSION: 1. Widespread skeletal metastasis in the pelvis, femurs, spine, and ribs. 2. Asymmetric thickening of the left aspect of prostate gland peripheral zone could represent prostate carcinoma if no primary discovered. 3. Cystic lesion within the uncinate process of pancreas. Recommend correlation with CA 19 none and followup MRI of the pancreas with contrast in no primary discovered. 4. Small hypodense lesion within the liver cannot be fully characterized. 5. Severe atherosclerotic narrowing of the left common iliac artery with reconstitution.   Electronically Signed   By: Nicole Kindred  Leonia Reeves M.D.   On: 09/20/2014 18:06   Dg Outside Films Extremity  09/19/2014   This examination belongs to an outside facility and is stored  here for comparison purposes only.  Contact the originating outside  institution for any associated report or interpretation.   Microbiology: Recent Results (from the past 240 hour(s))  AFB culture with smear     Status: None (Preliminary result)   Collection  Time: 09/21/14  9:01 AM  Result Value Ref Range Status   Specimen Description BRONCHIAL ALVEOLAR LAVAGE  Final   Special Requests NONE  Final   Acid Fast Smear   Final    NO ACID FAST BACILLI SEEN Performed at [facility]    Culture   Final    CULTURE WILL BE EXAMINED FOR 6 WEEKS BEFORE ISSUING A FINAL REPORT Performed at [facility]    Report Status PENDING  Incomplete  Culture, bal-quantitative     Status: None   Collection Time: 09/21/14  9:01 AM  Result Value Ref Range Status   Specimen Description BRONCHIAL ALVEOLAR LAVAGE  Final   Special Requests NONE  Final   Gram Stain   Final    RARE WBC PRESENT, PREDOMINANTLY MONONUCLEAR NO SQUAMOUS EPITHELIAL CELLS SEEN NO ORGANISMS SEEN Performed at [place]   Final    >=100,000 COLONIES/ML Performed at [facility]    Culture   Final    Non-Pathogenic Oropharyngeal-type Flora Isolated. Performed at [facility]    Report Status 09/23/2014 FINAL  Final     Labs: Basic Metabolic Panel:  Recent Labs Lab 09/18/14 1900 09/19/14 0530  NA 138 137  K 5.0 4.5  CL 113* 114*  CO2 18* 18*  GLUCOSE 116* 108*  BUN 29* 28*  CREATININE 1.55* 1.28  CALCIUM 8.1* 8.1*   Liver Function Tests:  Recent Labs Lab 09/18/14 1900 09/20/14 1120  AST 40* 29  ALT 34 34  ALKPHOS 988* 882*  BILITOT 0.3 0.1*  PROT 6.7 6.5  ALBUMIN 2.3* 2.3*   No results for input(s): LIPASE, AMYLASE in the last 168 hours. No results for input(s): AMMONIA in the last 168 hours. CBC:  Recent Labs Lab 09/18/14 1900 09/19/14 0530  WBC 15.4* 12.2*  HGB 13.0 12.0*  HCT 38.9* 36.2*  MCV 83.3 83.4  PLT 389 393   Cardiac Enzymes: No results for input(s): CKTOTAL, CKMB, CKMBINDEX, TROPONINI in the last 168 hours. BNP: BNP (last 3 results) No results for input(s): BNP in the last 8760 hours.  ProBNP (last 3 results) No results for input(s): PROBNP in the last 8760 hours.  CBG: No  results for input(s): GLUCAP in the last 168 hours.     SignedDelfina Redwood  [facility] 09/24/2014, 1:11 PM

## 2014-09-24 NOTE — Progress Notes (Signed)
Discharge to SNF, [place] transported by [facility]. Alert and oriented, not in any distress on discharge.

## 2014-09-24 NOTE — Clinical Social Work Psychosocial (Signed)
Clinical Social Work Department BRIEF PSYCHOSOCIAL ASSESSMENT 09/24/2014  Patient:  Francisco Love,Francisco Love     Account Number:  [id number]     Admit date:  09/18/2014  Clinical Social Worker:  Dian Queen  Date/Time:  09/24/2014 11:45 AM  Referred by:  Physician  Date Referred:  09/24/2014 Referred for  SNF Placement   Other Referral:   Interview type:  Patient Other interview type:    PSYCHOSOCIAL DATA Living Status:  ALONE Admitted from facility:   Level of care:   Primary support name:  Francisco Love Primary support relationship to patient:  SIBLING Degree of support available:   Patient lives on his own, and his sister is available if he needs help but he usually takes care of himself.    CURRENT CONCERNS Current Concerns  Post-Acute Placement   Other Concerns:    SOCIAL WORK ASSESSMENT / PLAN Patient is a 54 year old male who lives on his own and is alert and oriented x4.  Patient was expressed that he is worried and scared that he may have cancer.  Patient was wanting to know the results of the tests before he decides what his next step would be.  Patient was explained the process of looking for SNF.  Patient did agree go go to a SNF for rehab.  Patient was informed that information will be faxed out to find out who has a bed available.  Patient will discharge once medically ready and discharge orders have been received.   Assessment/plan status:  Psychosocial Support/Ongoing Assessment of Needs Other assessment/ plan:   Information/referral to community resources:    PATIENT'S/FAMILY'S RESPONSE TO PLAN OF CARE: Patient and family in agreement to going to SNF for short term rehab.    Jones Broom. [place], MSW, [place] 09/24/2014 12:02 PM

## 2014-09-24 NOTE — Clinical Social Work Note (Signed)
Patient to be d/c'ed today to [facility].  Patient and family agreeable to plans will transport via ems RN to call report.  Patient and family appreciative of work helping patient get to a facility for rehab.  Evette Cristal, MSW, [place]

## 2014-09-24 NOTE — Progress Notes (Signed)
   Name: Francisco Love MRN: [id number] DOB: 12-22-1960    ADMISSION DATE:  09/18/2014 CONSULTATION DATE:  09/20/2014  REFERRING MD :  [facility]  CHIEF COMPLAINT:  Lung mass  BRIEF PATIENT DESCRIPTION:  54 yo male former smoker with progressive dyspnea.  He had recent CT chest as outpt which showed lung mass.  SIGNIFICANT EVENTS  1/30 admit  STUDIES:  1/29 CT chest (OSH) >> RUL/mediastinal mass, emphysema   HISTORY OF PRESENT ILLNESS:   54 yo male presented to OSH with progressive dyspnea.  He has extensive hx of smoking, and quit few months ago.  He had CXR which showed fullness, and then had CT chest.  He was transferred to [facility] for further assessment.  Unfortunately his CT chest was not available for review until today, and pulmonary consult was then done.  He denies headache, blurred vision, hoarseness, dysphagia, gland swelling, chest pain, fever, hemoptysis, sweats, abdominal pain, or weight loss.   He reports hx of rheumatoid arthritis in his legs and takes ibuprofen.  SUBJECTIVE: no CP, dyspnea afebrile  VITAL SIGNS: Temp:  [97.7 F (36.5 C)-97.8 F (36.6 C)] 97.8 F (36.6 C) (02/05 0900) Pulse Rate:  [89-106] 104 (02/05 0900) Resp:  [18-22] 22 (02/05 0900) BP: (95-105)/(67-78) 95/67 mmHg (02/05 0900) SpO2:  [92 %-100 %] 96 % (02/05 0900)  PHYSICAL EXAMINATION: General: no distress Neuro: alert, normal strength, CN intact HEENT: pupils reactive, no sinus tenderness, MP 2, poor dentition, no LAN Cardiovascular: regular Lungs: clear, no wheeze Abdomen: soft, non tender Musculoskeletal: no edema Skin: desquamation of dorsal penis    Recent Labs Lab 09/18/14 1900 09/19/14 0530  NA 138 137  K 5.0 4.5  CL 113* 114*  CO2 18* 18*  BUN 29* 28*  CREATININE 1.55* 1.28  GLUCOSE 116* 108*     Recent Labs Lab 09/18/14 1900 09/19/14 0530  HGB 13.0 12.0*  HCT 38.9* 36.2*  WBC 15.4* 12.2*  PLT 389 393    No results found.  ASSESSMENT / PLAN:  54 yo  male smoker with RUL and mediastinal mass >> paratracheal & mild subcarinal lymphadenopathy main concern is for malignancy. Plan: Await cytology from [facility] Onc input pending above If non diagnostic, will need mediastinoscopy vs CT guided biopsy of pancreas  Hx of smoking with changes of emphysema on CT chest. Plan: outpt FU  ?Hx of Rheumatoid arthritis. Plan: - per primary team  SNF planned [facility] to sign off   [facility] V. MD 09/24/2014, 12:43 PM

## 2014-09-24 NOTE — Clinical Social Work Placement (Addendum)
Clinical Social Work Department CLINICAL SOCIAL WORK PLACEMENT NOTE 09/24/2014  Patient:  Francisco Love,Francisco Love  Account Number:  [id number] Admit date:  09/18/2014  Clinical Social Worker:  Shalay Carder, LCSWA  Date/time:  09/24/2014 12:02 PM  Clinical Social Work is seeking post-discharge placement for this patient at the following level of care:   SKILLED NURSING   (*CSW will update this form in Epic as items are completed)   09/24/2014  Patient/family provided with [place] Department of Clinical Social Work's list of facilities offering this level of care within the geographic area requested by the patient (or if unable, by the patient's family).  09/24/2014  Patient/family informed of their freedom to choose among providers that offer the needed level of care, that participate in Medicare, Medicaid or managed care program needed by the patient, have an available bed and are willing to accept the patient.  09/24/2014  Patient/family informed of [facility]' ownership interest in [facility], as well as of the fact that they are under no obligation to receive care at this facility.  PASARR submitted to EDS on 09/24/2014 PASARR number received on 09/24/2014  FL2 transmitted to all facilities in geographic area requested by pt/family on  09/24/2014 FL2 transmitted to all facilities within larger geographic area on 09/24/2014  Patient informed that his/her managed care company has contracts with or will negotiate with  certain facilities, including the following:     Patient/family informed of bed offers received: 09/24/14  Patient chooses bed at [facility]  Physician recommends and patient chooses bed at    Patient to be transferred to [facility]  On 09/24/14   Patient to be transferred to facility by [facility] Patient and family notified of transfer on 09/24/14 Name of family member notified: [name] [phone]  The following physician request were entered in Epic:      Additional Comments:  Jones Broom. [place], MSW, [place] 09/24/2014 5:21 PM

## 2014-09-24 NOTE — Care Management Note (Signed)
  Page 1 of 1   09/24/2014     10:06:08 AM CARE MANAGEMENT NOTE 09/24/2014  Patient:  Francisco Love,Francisco Love   Account Number:  [id number]  Date Initiated:  09/21/2014  Documentation initiated by:  Magdalen Spatz  Subjective/Objective Assessment:     Action/Plan:   Anticipated DC Date:  09/24/2014   Anticipated DC Plan:  SKILLED NURSING FACILITY  In-house referral  Clinical Social Worker         Choice offered to / List presented to:             Status of service:   Medicare Important Message given?  YES (If response is "NO", the following Medicare IM given date fields will be blank) Date Medicare IM given:  09/21/2014 Medicare IM given by:  Magdalen Spatz Date Additional Medicare IM given:  09/24/2014 Additional Medicare IM given by:  Magdalen Spatz  Discharge Disposition:  [place]  Per UR Regulation:  Reviewed for med. necessity/level of care/duration of stay  If discussed at [place] of Stay Meetings, dates discussed:   09/23/2014    Comments:

## 2014-09-27 ENCOUNTER — Non-Acute Institutional Stay (SKILLED_NURSING_FACILITY): Payer: Medicaid Other | Admitting: Internal Medicine

## 2014-09-27 ENCOUNTER — Encounter (HOSPITAL_COMMUNITY): Payer: Self-pay | Admitting: Pulmonary Disease

## 2014-09-27 DIAGNOSIS — E46 Unspecified protein-calorie malnutrition: Secondary | ICD-10-CM | POA: Diagnosis not present

## 2014-09-27 DIAGNOSIS — N4889 Other specified disorders of penis: Secondary | ICD-10-CM | POA: Diagnosis not present

## 2014-09-27 DIAGNOSIS — Z720 Tobacco use: Secondary | ICD-10-CM | POA: Diagnosis not present

## 2014-09-27 DIAGNOSIS — J9601 Acute respiratory failure with hypoxia: Secondary | ICD-10-CM

## 2014-09-27 DIAGNOSIS — N489 Disorder of penis, unspecified: Secondary | ICD-10-CM

## 2014-09-27 DIAGNOSIS — C801 Malignant (primary) neoplasm, unspecified: Secondary | ICD-10-CM | POA: Diagnosis not present

## 2014-09-27 DIAGNOSIS — R918 Other nonspecific abnormal finding of lung field: Secondary | ICD-10-CM

## 2014-09-27 DIAGNOSIS — R5381 Other malaise: Secondary | ICD-10-CM

## 2014-09-27 DIAGNOSIS — C7951 Secondary malignant neoplasm of bone: Secondary | ICD-10-CM | POA: Diagnosis not present

## 2014-09-27 NOTE — Progress Notes (Signed)
MRN: [id number] Name: Francisco Love  Sex: male Age: 54 y.o. DOB: Jan 15, 1961  [place] [facility] Facility/Room:220 Level Of Care: SNF Provider: Inocencio Homes D Emergency Contacts: Extended Emergency Contact Information Primary Emergency Contact: Raynor,Nancy Address: [address]          [place], [place] [address] [place] Phone: [phone] Mobile Phone: [phone] Relation: Sister Secondary Emergency Contact: Jarrett Soho Address: [address]          [place], [place] [address] [place] Phone: [phone] Mobile Phone: [phone] Relation: Sister    Allergies: Review of patient's allergies indicates no known allergies.  Chief Complaint  Patient presents with  . New Admit To SNF    HPI: Patient is 54 y.o. male who was hospitalized with SOB, w/u revealed probable metastatic lung CA who is admited to [facility] for generalized weakness for OT/PT and supervision.  Past Medical History  Diagnosis Date  . Shortness of breath dyspnea     Past Surgical History  Procedure Laterality Date  . Appendectomy    . Video bronchoscopy N/A 09/21/2014    Procedure: VIDEO BRONCHOSCOPY WITH FLUORO;  Surgeon: Rigoberto Noel, MD;  Location: [place];  Service: Cardiopulmonary;  Laterality: N/A;  . Video bronchoscopy with endobronchial ultrasound N/A 09/23/2014    Procedure: VIDEO BRONCHOSCOPY WITH ENDOBRONCHIAL ULTRASOUND;  Surgeon: Rigoberto Noel, MD;  Location: [place];  Service: Pulmonary;  Laterality: N/A;      Medication List       This list is accurate as of: 09/27/14 11:59 PM.  Always use your most recent med list.               acetaminophen 325 MG tablet  Commonly known as:  TYLENOL  Take 2 tablets (650 mg total) by mouth every 6 (six) hours as needed for mild pain (or Fever >/= 101).     albuterol (2.5 MG/3ML) 0.083% nebulizer solution  Commonly known as:  PROVENTIL  Take 3 mLs (2.5 mg total) by nebulization every 2 (two) hours as needed  for wheezing.     feeding supplement (ENSURE COMPLETE) Liqd  Take 237 mLs by mouth 3 (three) times daily between meals.     guaiFENesin-dextromethorphan 100-10 MG/5ML syrup  Commonly known as:  ROBITUSSIN DM  Take 5 mLs by mouth every 4 (four) hours as needed for cough.     oxyCODONE 5 MG immediate release tablet  Commonly known as:  Oxy IR/ROXICODONE  Take 1 tablet (5 mg total) by mouth every 4 (four) hours as needed for moderate pain.        No orders of the defined types were placed in this encounter.     There is no immunization history on file for this patient.  History  Substance Use Topics  . Smoking status: Former Smoker -- 1.00 packs/day    Types: Cigarettes    Quit date: 06/20/2014  . Smokeless tobacco: Not on file  . Alcohol Use: No    Family history is noncontributory    Review of Systems  DATA OBTAINED: from patient,medical record GENERAL:  no fevers, fatigue, appetite changes SKIN: No itching, rash or wounds EYES: No eye pain, redness, discharge EARS: No earache, tinnitus, change in hearing NOSE: No congestion, drainage or bleeding  MOUTH/THROAT: No mouth or tooth pain, No sore throat RESPIRATORY: No cough, wheezing, SOB, denies pain CARDIAC: No chest pain, palpitations, lower extremity edema  GI: No abdominal pain, No N/V/D or constipation, No heartburn or reflux  GU: No dysuria, frequency or urgency, or incontinence  MUSCULOSKELETAL: No unrelieved bone/joint pain NEUROLOGIC: No headache, dizziness or focal weakness PSYCHIATRIC: No overt anxiety or sadness, No behavior issue.   Filed Vitals:   09/27/14 1156  BP: 112/90  Pulse: 77  Temp: 97 F (36.1 C)  Resp: 18    Physical Exam  GENERAL APPEARANCE: Alert, conversant, BM  No acute distress.  SKIN: No diaphoresis rash HEAD: Normocephalic, atraumatic  EYES: Conjunctiva/lids clear. Pupils round, reactive. EOMs intact.  EARS: External exam WNL, canals clear. Hearing grossly normal.  NOSE: No  deformity or discharge.  MOUTH/THROAT: Lips w/o lesions  RESPIRATORY: Breathing is even, unlabored. Lung sounds are clear ; slt dec on L  CARDIOVASCULAR: Heart RRR no murmurs, rubs or gallops. No peripheral edema.   GASTROINTESTINAL: Abdomen is soft, non-tender, not distended w/ normal bowel sounds. GENITOURINARY: Bladder non tender, not distended  MUSCULOSKELETAL: No abnormal joints or musculature NEUROLOGIC:  Cranial nerves 2-12 grossly intact. Moves all extremities  PSYCHIATRIC: exhibits poor insight, no behavioral issues  Patient Active Problem List   Diagnosis Date Noted  . Penile lesion 09/28/2014  . Tobacco abuse 09/28/2014  . Protein-calorie malnutrition 09/28/2014  . Debility 09/23/2014  . Respiratory failure, acute 09/23/2014  . Mediastinal lymphadenopathy   . 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 Lung mass 09/18/2014  . Shortness of breath 09/18/2014    CBC    Component Value Date/Time   WBC 12.2* 09/19/2014 0530   RBC 4.34 09/19/2014 0530   HGB 12.0* 09/19/2014 0530   HCT 36.2* 09/19/2014 0530   PLT 393 09/19/2014 0530   MCV 83.4 09/19/2014 0530    CMP     Component Value Date/Time   NA 137 09/19/2014 0530   K 4.5 09/19/2014 0530   CL 114* 09/19/2014 0530   CO2 18* 09/19/2014 0530   GLUCOSE 108* 09/19/2014 0530   BUN 28* 09/19/2014 0530   CREATININE 1.28 09/19/2014 0530   CALCIUM 8.1* 09/19/2014 0530   PROT 6.5 09/20/2014 1120   ALBUMIN 2.3* 09/20/2014 1120   AST 29 09/20/2014 1120   ALT 34 09/20/2014 1120   ALKPHOS 882* 09/20/2014 1120   BILITOT 0.1* 09/20/2014 1120   GFRNONAA 61* 09/19/2014 0530   GFRAA 71* 09/19/2014 0530    Assessment and Plan  Lung mass CT angiogram of the chest was done which was negative for PE but showed a large right upper lobe mass encasing right pulmonary artery and encroaching onto the carina, and mediastinal lymphadenopathy Patient was then transferred to [facility]  for further management. Pulmonary consulted. CT abd and pelvis shows diffuse skeletal metastasis., cystic lesion on pancreas and Asymmetric thickening of prostate. Likely metastatic lung cancer Initial Bronchoscopy, biopsy nondiagnostic. S/p endobronchial ultrasound guided transbronchial needle aspiration 09/23/14. Path pending. Dr. Elsworth Soho recommends making appointment with Dr. Earlie Server to f/u. If biopsies, nondiagnostic, will need mediastinoscopy v. 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 Will need home oxygen. Suspect acute on chronic hypoxic respiratory failure   Penile lesion Needs dermatology evaluation as outpt   Tobacco abuse Patient reports quitting smoking for past 4-5 months.   Protein-calorie malnutrition patient reports weight loss of about 20 lbs   Debility PT recommending SNF vs 24 hour supervision. Lives alone and family works during the day. Patient willing to  go to SNF. Has a SNF bed in [place] today.      Hennie Duos, MD

## 2014-09-28 ENCOUNTER — Encounter: Payer: Self-pay | Admitting: Internal Medicine

## 2014-09-28 DIAGNOSIS — N489 Disorder of penis, unspecified: Secondary | ICD-10-CM | POA: Insufficient documentation

## 2014-09-28 DIAGNOSIS — E46 Unspecified protein-calorie malnutrition: Secondary | ICD-10-CM | POA: Insufficient documentation

## 2014-09-28 DIAGNOSIS — Z720 Tobacco use: Secondary | ICD-10-CM | POA: Insufficient documentation

## 2014-09-28 NOTE — Assessment & Plan Note (Signed)
patient reports weight loss of about 20 lbs

## 2014-09-28 NOTE — Assessment & Plan Note (Signed)
CT angiogram of the chest was done which was negative for PE but showed a large right upper lobe mass encasing right pulmonary artery and encroaching onto the carina, and mediastinal lymphadenopathy Patient was then transferred to [facility] for further management. Pulmonary consulted. CT abd and pelvis shows diffuse skeletal metastasis., cystic lesion on pancreas and Asymmetric thickening of prostate. Likely metastatic lung cancer Initial Bronchoscopy, biopsy nondiagnostic. S/p endobronchial ultrasound guided transbronchial needle aspiration 09/23/14. Path pending. Dr. Elsworth Soho recommends making appointment with Dr. Earlie Server to f/u. If biopsies, nondiagnostic, will need mediastinoscopy v. bx of pancreatic lesion

## 2014-09-28 NOTE — Assessment & Plan Note (Signed)
Patient reports quitting smoking for past 4-5 months.

## 2014-09-28 NOTE — Assessment & Plan Note (Addendum)
PT recommending SNF vs 24 hour supervision. Lives alone and family works during the day. Patient willing to go to SNF

## 2014-09-28 NOTE — Assessment & Plan Note (Signed)
sats dropped to 81% on RA. Will need home oxygen. Suspect acute on chronic hypoxic respiratory failure

## 2014-09-28 NOTE — Assessment & Plan Note (Signed)
Needs dermatology evaluation as outpt

## 2014-09-28 NOTE — Assessment & Plan Note (Signed)
as seen on CT CA 19-9 not high

## 2014-10-04 ENCOUNTER — Telehealth: Payer: Self-pay | Admitting: Internal Medicine

## 2014-10-04 ENCOUNTER — Non-Acute Institutional Stay (SKILLED_NURSING_FACILITY): Payer: Medicaid Other | Admitting: Nurse Practitioner

## 2014-10-04 DIAGNOSIS — B351 Tinea unguium: Secondary | ICD-10-CM

## 2014-10-04 DIAGNOSIS — D72829 Elevated white blood cell count, unspecified: Secondary | ICD-10-CM | POA: Diagnosis not present

## 2014-10-04 DIAGNOSIS — R609 Edema, unspecified: Secondary | ICD-10-CM

## 2014-10-04 NOTE — Telephone Encounter (Signed)
S/W  PT'S NURSE  IN REF TO NP APPT. ON 10/06/14 @1 :30 REFERRING DR Evette Cristal [place]

## 2014-10-04 NOTE — Telephone Encounter (Signed)
CHART DELIVERED

## 2014-10-04 NOTE — Progress Notes (Signed)
Patient ID: Francisco Love, male   DOB: Sep 03, 1960, 54 y.o.   MRN: [id number]    Nursing Home Location:  [facility]   Place of Service: SNF (31)  PCP: [name], [name], MD  No Known Allergies  Chief Complaint  Patient presents with  . Acute Visit    HPI:  Patient is a 54 y.o. male seen today at [facility] for acute visit due to swelling in bilateral feet with pain. Pt currently at [facility] recent admission [facility] which reveal lung mass. Pt has follow up with oncologist scheduled. Pt reports severe dull pain on the tops of bilateral feet. Really wants something to help the swelling because it is causing severe 10/10 pain.  No lesions, sores, leg or toe pain. No fevers.   Review of Systems:  Review of Systems  Constitutional: Negative for activity change, appetite change, fatigue and unexpected weight change.  Respiratory: Positive for shortness of breath. Negative for cough.   Cardiovascular: Positive for leg swelling. Negative for chest pain and palpitations.  Gastrointestinal: Negative for abdominal pain.  Musculoskeletal: Positive for myalgias. Negative for arthralgias.  Skin: Negative for color change and wound.  Neurological: Positive for weakness. Negative for dizziness.    Past Medical History  Diagnosis Date  . Shortness of breath dyspnea    Past Surgical History  Procedure Laterality Date  . Appendectomy    . Video bronchoscopy N/A 09/21/2014    Procedure: VIDEO BRONCHOSCOPY WITH FLUORO;  Surgeon: Rigoberto Noel, MD;  Location: [place];  Service: Cardiopulmonary;  Laterality: N/A;  . Video bronchoscopy with endobronchial ultrasound N/A 09/23/2014    Procedure: VIDEO BRONCHOSCOPY WITH ENDOBRONCHIAL ULTRASOUND;  Surgeon: Rigoberto Noel, MD;  Location: [place];  Service: Pulmonary;  Laterality: N/A;   Social History:   reports that he quit smoking about 3 months ago. His smoking use included Cigarettes. He smoked 1.00  pack per day. He does not have any smokeless tobacco history on file. He reports that he uses illicit drugs (Marijuana). He reports that he does not drink alcohol.  No family history on file.  Medications: Patient's Medications  New Prescriptions   No medications on file  Previous Medications   ACETAMINOPHEN (TYLENOL) 325 MG TABLET    Take 2 tablets (650 mg total) by mouth every 6 (six) hours as needed for mild pain (or Fever >/= 101).   ALBUTEROL (PROVENTIL) (2.5 MG/3ML) 0.083% NEBULIZER SOLUTION    Take 3 mLs (2.5 mg total) by nebulization every 2 (two) hours as needed for wheezing.   FEEDING SUPPLEMENT, ENSURE COMPLETE, (ENSURE COMPLETE) LIQD    Take 237 mLs by mouth 3 (three) times daily between meals.   GUAIFENESIN-DEXTROMETHORPHAN (ROBITUSSIN DM) 100-10 MG/5ML SYRUP    Take 5 mLs by mouth every 4 (four) hours as needed for cough.   OXYCODONE (OXY IR/ROXICODONE) 5 MG IMMEDIATE RELEASE TABLET    Take 1 tablet (5 mg total) by mouth every 4 (four) hours as needed for moderate pain.  Modified Medications   No medications on file  Discontinued Medications   No medications on file     Physical Exam: Filed Vitals:   10/04/14 1605  BP: 119/72  Pulse: 72  Temp: 98 F (36.7 C)  Resp: 18    Physical Exam  Constitutional: He is oriented to person, place, and time. He appears well-developed and well-nourished. No distress.  HENT:  Head: Normocephalic and atraumatic.  Mouth/Throat: Oropharynx is clear and moist.  No oropharyngeal exudate.  Cardiovascular: Normal rate, regular rhythm and normal heart sounds.   Pulmonary/Chest: Effort normal. He has decreased breath sounds.  Musculoskeletal: He exhibits edema and tenderness.  Bilateral feet swelling 2+, very tender to dorsal aspect of bilateral feet. Not tender to leg or calf area. Not tender to toes.   Neurological: He is alert and oriented to person, place, and time.  Skin: Skin is warm and dry. He is not diaphoretic.  exteremly long  curling toe nails on bilateral feet  Psychiatric: He has a normal mood and affect.    Labs reviewed: Basic Metabolic Panel:  Recent Labs  09/18/14 1900 09/19/14 0530  NA 138 137  K 5.0 4.5  CL 113* 114*  CO2 18* 18*  GLUCOSE 116* 108*  BUN 29* 28*  CREATININE 1.55* 1.28  CALCIUM 8.1* 8.1*   Liver Function Tests:  Recent Labs  09/18/14 1900 09/20/14 1120  AST 40* 29  ALT 34 34  ALKPHOS 988* 882*  BILITOT 0.3 0.1*  PROT 6.7 6.5  ALBUMIN 2.3* 2.3*   No results for input(s): LIPASE, AMYLASE in the last 8760 hours. No results for input(s): AMMONIA in the last 8760 hours. CBC:  Recent Labs  09/18/14 1900 09/19/14 0530  WBC 15.4* 12.2*  HGB 13.0 12.0*  HCT 38.9* 36.2*  MCV 83.3 83.4  PLT 389 393   TSH: No results for input(s): TSH in the last 8760 hours. A1C: No results found for: HGBA1C Lipid Panel: No results for input(s): CHOL, HDL, LDLCALC, TRIG, CHOLHDL, LDLDIRECT in the last 8760 hours.  CBC with Diff    Result: 09/27/2014 3:50 PM   ( Status: F )       WBC 12.2   H 4.0-10.5 K/uL SLN   RBC 4.27     4.22-5.81 MIL/uL SLN   Hemoglobin 11.6   L 13.0-17.0 g/dL SLN   Hematocrit 36.4   L 39.0-52.0 % SLN   MCV 85.2     78.0-100.0 fL SLN   MCH 27.2     26.0-34.0 pg SLN   MCHC 31.9     30.0-36.0 g/dL SLN   RDW 15.8   H 11.5-15.5 % SLN   Platelet Count 377     150-400 K/uL SLN C MPV 10.5     8.6-12.4 fL SLN   Granulocyte % 58     43-77 % SLN   Absolute Gran 7.1     1.7-7.7 K/uL SLN   Lymph % 25     12-46 % SLN   Absolute Lymph 3.1     0.7-4.0 K/uL SLN   Mono % 14   H 3-12 % SLN   Absolute Mono 1.7   H 0.1-1.0 K/uL SLN   Eos % 3     0-5 % SLN   Absolute Eos 0.4     0.0-0.7 K/uL SLN   Baso % 0     0-1 % SLN   Absolute Baso 0.0     0.0-0.1 K/uL SLN   Smear Review   SLN C Basic Metabolic Panel    Result: 09/27/2014 4:30 PM   ( Status: F )       Sodium 135     135-145 mEq/L SLN   Potassium 5.5   H 3.5-5.3 mEq/L SLN   Chloride 108     96-112 mEq/L SLN    CO2 21     19-32 mEq/L SLN   Glucose 83     70-99 mg/dL SLN  BUN 12     6-23 mg/dL SLN   Creatinine 0.93     0.50-1.35 mg/dL SLN   Calcium 7.8   L 8.4-10.5 mg/dL SLN   Assessment/Plan 1. Edema Worsening edema on bilateral feet with increased pain. Legs with mild edema but no pain. Will dc norco and start oxycodone (as it appears was ordered from hospital) 5 mg 1-2 tablet PO q 4 hours PRN pain.  -pt to cont to elevate LE -will give lasix 40 mg PO daily for 3 days, then follow up BMP  2. Tinea unguium -on bilateral feet. Toenails look bad and need to be cut. Will get podiatry consult ASAP   3. Leukocytosis  Unchanged from hospitalization, will cont to follow

## 2014-10-06 ENCOUNTER — Other Ambulatory Visit: Payer: Self-pay | Admitting: *Deleted

## 2014-10-06 ENCOUNTER — Ambulatory Visit (HOSPITAL_BASED_OUTPATIENT_CLINIC_OR_DEPARTMENT_OTHER): Payer: Self-pay | Admitting: Internal Medicine

## 2014-10-06 ENCOUNTER — Encounter: Payer: Self-pay | Admitting: Internal Medicine

## 2014-10-06 ENCOUNTER — Other Ambulatory Visit (HOSPITAL_BASED_OUTPATIENT_CLINIC_OR_DEPARTMENT_OTHER): Payer: Self-pay

## 2014-10-06 ENCOUNTER — Other Ambulatory Visit: Payer: Self-pay | Admitting: Internal Medicine

## 2014-10-06 ENCOUNTER — Ambulatory Visit: Payer: Self-pay

## 2014-10-06 ENCOUNTER — Telehealth: Payer: Self-pay | Admitting: Internal Medicine

## 2014-10-06 VITALS — BP 112/72 | HR 112 | Temp 98.1°F | Resp 21 | Ht 69.0 in | Wt 140.7 lb

## 2014-10-06 DIAGNOSIS — C7951 Secondary malignant neoplasm of bone: Secondary | ICD-10-CM

## 2014-10-06 DIAGNOSIS — C787 Secondary malignant neoplasm of liver and intrahepatic bile duct: Secondary | ICD-10-CM

## 2014-10-06 DIAGNOSIS — C7989 Secondary malignant neoplasm of other specified sites: Secondary | ICD-10-CM

## 2014-10-06 DIAGNOSIS — R918 Other nonspecific abnormal finding of lung field: Secondary | ICD-10-CM

## 2014-10-06 DIAGNOSIS — C3411 Malignant neoplasm of upper lobe, right bronchus or lung: Secondary | ICD-10-CM

## 2014-10-06 DIAGNOSIS — C3491 Malignant neoplasm of unspecified part of right bronchus or lung: Secondary | ICD-10-CM | POA: Insufficient documentation

## 2014-10-06 DIAGNOSIS — C801 Malignant (primary) neoplasm, unspecified: Principal | ICD-10-CM

## 2014-10-06 LAB — CBC WITH DIFFERENTIAL/PLATELET
BASO%: 0.4 % (ref 0.0–2.0)
Basophils Absolute: 0.1 10*3/uL (ref 0.0–0.1)
EOS%: 5.2 % (ref 0.0–7.0)
Eosinophils Absolute: 0.7 10*3/uL — ABNORMAL HIGH (ref 0.0–0.5)
HCT: 35.6 % — ABNORMAL LOW (ref 38.4–49.9)
HGB: 11.4 g/dL — ABNORMAL LOW (ref 13.0–17.1)
LYMPH#: 2.6 10*3/uL (ref 0.9–3.3)
LYMPH%: 20.6 % (ref 14.0–49.0)
MCH: 27 pg — ABNORMAL LOW (ref 27.2–33.4)
MCHC: 32 g/dL (ref 32.0–36.0)
MCV: 84.4 fL (ref 79.3–98.0)
MONO#: 1.1 10*3/uL — ABNORMAL HIGH (ref 0.1–0.9)
MONO%: 8.8 % (ref 0.0–14.0)
NEUT#: 8.3 10*3/uL — ABNORMAL HIGH (ref 1.5–6.5)
NEUT%: 65 % (ref 39.0–75.0)
PLATELETS: 437 10*3/uL — AB (ref 140–400)
RBC: 4.22 10*6/uL (ref 4.20–5.82)
RDW: 15 % — ABNORMAL HIGH (ref 11.0–14.6)
WBC: 12.8 10*3/uL — AB (ref 4.0–10.3)

## 2014-10-06 LAB — COMPREHENSIVE METABOLIC PANEL (CC13)
ALT: 43 U/L (ref 0–55)
ANION GAP: 10 meq/L (ref 3–11)
AST: 33 U/L (ref 5–34)
Albumin: 2.1 g/dL — ABNORMAL LOW (ref 3.5–5.0)
Alkaline Phosphatase: 654 U/L — ABNORMAL HIGH (ref 40–150)
BUN: 19.4 mg/dL (ref 7.0–26.0)
CALCIUM: 8.4 mg/dL (ref 8.4–10.4)
CO2: 19 mEq/L — ABNORMAL LOW (ref 22–29)
CREATININE: 1 mg/dL (ref 0.7–1.3)
Chloride: 110 mEq/L — ABNORMAL HIGH (ref 98–109)
EGFR: >90 mL/min/{1.73_m2} (ref >90)
Glucose: 104 mg/dl (ref 70–140)
Potassium: 4.7 mEq/L (ref 3.5–5.1)
Sodium: 139 mEq/L (ref 136–145)
Total Bilirubin: <0.2 mg/dL (ref 0.20–1.20)
Total Protein: 7.1 g/dL (ref 6.4–8.3)

## 2014-10-06 NOTE — Telephone Encounter (Signed)
gv and printed appt sched and avs for pt for Feb..

## 2014-10-06 NOTE — Progress Notes (Signed)
[place] [phone]   [phone]  CONSULT NOTE  REFERRING PHYSICIAN: Dr. Kara Mead  REASON FOR CONSULTATION:  54 years old African-American male recently diagnosed with lung cancer.  HPI Francisco Love is a 54 y.o. male with no significant past medical history about long history of smoking and quit 8 months ago. The patient has been complaining of progressive shortness of breath over the last few weeks. He presented to the emergency department at [place] area. He had CT scan of the chest performed on 09/17/2014 and it showed 6.8 x 6.0 x 5.6 cm right paratracheal mass/malignancy extending to the level of the carina and had loud the trachea and encases the right upper lobe pulmonary artery. There was significant subcarinal and right hilar adenopathy. The patient was transferred to [facility] and CT scan of the abdomen and pelvis were performed on 09/20/2014 and it showed widespread skeletal metastases in the pelvis, femurs, spine and ribs . There was a symmetric thickening of the left aspect of the prostate gland peripheral zone that could represent prostate carcinoma if no primary discovered. There was also cystic lesion within the uncinate process of the pancreas. There was also a hypodense lesion within the liver. The patient was seen by Dr. Creta Levin and on 09/23/2014 he underwent flexible bronchoscopy with endobronchial ultrasound and transbronchial needle aspiration of the right paratracheal lymph node as well as transbronchial needle aspiration of the subcarinal lymph node.   The final pathology (Accession: [id number]) of the fine-needle aspiration of the forearm lymph node showed malignant cells consistent with non-small cell carcinoma and the features favor adenocarcinoma. The material was insufficient for molecular studies.  The patient was referred to me today for further evaluation and recommendation regarding treatment of his condition. When seen  today he denied having any significant complaints except for shortness of breath at baseline and increased with exertion. He denied having any significant chest pain. He has cough productive of brownish sputum. He denied having any significant weight loss or night sweats. He has a swelling of the lower extremities. The patient denied having any significant nausea, vomiting, abdominal pain, diarrhea or constipation.  Family history significant for mother with hypertension and father died in car accident.  The patient is separated and he was accompanied by his wife Francisco Love. He has no children. He is currently on disability. He has a history of smoking 1 pack per day for around 40 years and quit 8 months ago. He has a history of alcohol abuse but also quit 8 months ago. He has no history of drug abuse.  HPI  Past Medical History  Diagnosis Date  . Shortness of breath dyspnea     Past Surgical History  Procedure Laterality Date  . Appendectomy    . Video bronchoscopy N/A 09/21/2014    Procedure: VIDEO BRONCHOSCOPY WITH FLUORO;  Surgeon: Rigoberto Noel, MD;  Location: [place];  Service: Cardiopulmonary;  Laterality: N/A;  . Video bronchoscopy with endobronchial ultrasound N/A 09/23/2014    Procedure: VIDEO BRONCHOSCOPY WITH ENDOBRONCHIAL ULTRASOUND;  Surgeon: Rigoberto Noel, MD;  Location: [place];  Service: Pulmonary;  Laterality: N/A;    History reviewed. No pertinent family history.  Social History History  Substance Use Topics  . Smoking status: Former Smoker -- 1.00 packs/day    Types: Cigarettes    Quit date: 06/20/2014  . Smokeless tobacco: Not on file  . Alcohol Use: No    No Known Allergies  Current Outpatient Prescriptions  Medication Sig Dispense Refill  . acetaminophen (TYLENOL) 325 MG tablet Take 2 tablets (650 mg total) by mouth every 6 (six) hours as needed for mild pain (or Fever >/= 101).    Marland Kitchen albuterol (PROVENTIL) (2.5 MG/3ML) 0.083% nebulizer solution Take 3 mLs (2.5  mg total) by nebulization every 2 (two) hours as needed for wheezing. 75 mL 12  . bisacodyl (DULCOLAX) 10 MG suppository Place 10 mg rectally daily as needed for moderate constipation.    . diphenhydrAMINE (SOMINEX) 25 MG tablet Take 25 mg by mouth as needed for itching or sleep.    . feeding supplement, ENSURE COMPLETE, (ENSURE COMPLETE) LIQD Take 237 mLs by mouth 3 (three) times daily between meals.    Marland Kitchen oxyCODONE (OXY IR/ROXICODONE) 5 MG immediate release tablet Take 1 tablet (5 mg total) by mouth every 4 (four) hours as needed for moderate pain. 20 tablet 0  . guaiFENesin-dextromethorphan (ROBITUSSIN DM) 100-10 MG/5ML syrup Take 5 mLs by mouth every 4 (four) hours as needed for cough. (Patient not taking: Reported on 10/06/2014) 118 mL 0  . Magnesium Hydroxide (MILK OF MAGNESIA PO) Take by mouth as needed.     No current facility-administered medications for this visit.    Review of Systems  Constitutional: positive for fatigue Eyes: negative Ears, nose, mouth, throat, and face: negative Respiratory: positive for cough, dyspnea on exertion and sputum Cardiovascular: negative Gastrointestinal: negative Genitourinary:negative Integument/breast: negative Hematologic/lymphatic: negative Musculoskeletal:negative Neurological: negative Behavioral/Psych: negative Endocrine: negative Allergic/Immunologic: negative  Physical Exam  ZCH:YIFOY, healthy, no distress, well developed and malnourished SKIN: skin color, texture, turgor are normal, no rashes or significant lesions HEAD: Normocephalic, No masses, lesions, tenderness or abnormalities EYES: normal, PERRLA EARS: External ears normal, Canals clear OROPHARYNX:no exudate, no erythema and lips, buccal mucosa, and tongue normal  NECK: supple, no adenopathy, no JVD LYMPH:  no palpable lymphadenopathy, no hepatosplenomegaly LUNGS: clear to auscultation , and palpation HEART: regular rate & rhythm, no murmurs and no  gallops ABDOMEN:abdomen soft, non-tender, normal bowel sounds and no masses or organomegaly BACK: Back symmetric, no curvature., No CVA tenderness EXTREMITIES:no joint deformities, effusion, or inflammation, no edema, no skin discoloration, no clubbing  NEURO: alert & oriented x 3 with fluent speech, no focal motor/sensory deficits  PERFORMANCE STATUS: ECOG 1-2  LABORATORY DATA: Lab Results  Component Value Date   WBC 12.8* 10/06/2014   HGB 11.4* 10/06/2014   HCT 35.6* 10/06/2014   MCV 84.4 10/06/2014   PLT 437* 10/06/2014      Chemistry      Component Value Date/Time   NA 139 10/06/2014 1408   NA 137 09/19/2014 0530   K 4.7 10/06/2014 1408   K 4.5 09/19/2014 0530   CL 114* 09/19/2014 0530   CO2 19* 10/06/2014 1408   CO2 18* 09/19/2014 0530   BUN 19.4 10/06/2014 1408   BUN 28* 09/19/2014 0530   CREATININE 1.0 10/06/2014 1408   CREATININE 1.28 09/19/2014 0530      Component Value Date/Time   CALCIUM 8.4 10/06/2014 1408   CALCIUM 8.1* 09/19/2014 0530   ALKPHOS 654* 10/06/2014 1408   ALKPHOS 882* 09/20/2014 1120   AST 33 10/06/2014 1408   AST 29 09/20/2014 1120   ALT 43 10/06/2014 1408   ALT 34 09/20/2014 1120   BILITOT <0.20 10/06/2014 1408   BILITOT 0.1* 09/20/2014 1120       RADIOGRAPHIC STUDIES: Ct Abdomen Pelvis W Contrast  09/20/2014   CLINICAL DATA:  Mediastinal mass.  Evaluate for metastatic disease.  EXAM: CT ABDOMEN AND PELVIS WITH CONTRAST  TECHNIQUE: Multidetector CT imaging of the abdomen and pelvis was performed using the standard protocol following bolus administration of intravenous contrast.  CONTRAST:  152mL OMNIPAQUE IOHEXOL 300 MG/ML  SOLN  COMPARISON:  Outside CT with 09/17/2014.  FINDINGS: Lower chest: Diffuse ground-glass opacities in lower lobes. Bibasilar atelectasis.  Hepatobiliary: Single low-density lesion in the posterior aspect the right hepatic lobe measures 6 mm on image 32 is too small characterize. No biliary duct dilatation. Gallbladder  is normal.  Pancreas: Multilobular cystic lesions in the uncinate region of the pancreas with measuring approximately 21 x 25 mm on coronal image 42. There is calcifications within the uncinate process additionally. Main pancreatic duct is prominent within normal limits at 2 mm. The tail of pancreas is normal.  Spleen: Normal spleen.  Adrenals/urinary tract: Normal adrenal glands and kidneys. The ureters and bladder normal.  Stomach/Bowel: The stomach, small bowel, colon are unremarkable.  Vascular/Lymphatic: There is severe circumferential narrowing of the left common iliac artery (image 52). This artery does reconstitute.  No retroperitoneal periportal lymphadenopathy no central mesenteric adenopathy. No pelvic.  Reproductive: Asymmetric thickening along the peripheral zone of the prostate gland on the left (image 70).  Musculoskeletal: Severe degenerative changes hips remodeling of femoral heads with extensive sub chondral cystic change.  There are smudgy sclerotic lesions throughout skeleton. Lesions in left and right inferior pubic rami on image 79 and 7 7. Multiple lesions to the sacrum iliac bones. Lesions in the proximal left femur. Example lesions inn left iliac wing on image 60 measures 25 mm). Multiple lesions throughout lumbar spine hand ribs. Example lesion at T11 measures 20 mm  Other: Left inguinal hernia small amount fluid.  IMPRESSION: 1. Widespread skeletal metastasis in the pelvis, femurs, spine, and ribs. 2. Asymmetric thickening of the left aspect of prostate gland peripheral zone could represent prostate carcinoma if no primary discovered. 3. Cystic lesion within the uncinate process of pancreas. Recommend correlation with CA 19 none and followup MRI of the pancreas with contrast in no primary discovered. 4. Small hypodense lesion within the liver cannot be fully characterized. 5. Severe atherosclerotic narrowing of the left common iliac artery with reconstitution.   Electronically Signed   By:  Suzy Bouchard M.D.   On: 09/20/2014 18:06   Dg Outside Films Extremity  09/19/2014   This examination belongs to an outside facility and is stored  here for comparison purposes only.  Contact the originating outside  institution for any associated report or interpretation.   ASSESSMENT: This is a very pleasant 54 years old African-American male recently diagnosed with stage IV (T2b, N2, M1b) non-small cell lung cancer, adenocarcinoma with a large right hilar mass as well as mediastinal lymphadenopathy and metastatic disease to the liver, bone and pancreas.   PLAN: I had a lengthy discussion with the patient and his wife today about his current disease stage, prognosis and treatment options.   I recommended for the patient to complete the staging workup by ordering a PET scan as well as MRI of the brain to rule out any other metastatic disease. Unfortunately there is insufficient material for molecular studies on this patient. I may consider him for [employer] 360 blood test. I will see the patient back for follow-up visit in around 10 days after completing the staging workup for more detailed discussion of his treatment options. I would probably consider him for systemic chemotherapy with carboplatin and Alimta. He is currently asymptomatic but he may  need referral to radiation oncology for palliative radiotherapy to the metastatic bone lesions. The patient was advised to call immediately if he has any concerning symptoms in the interval.  The patient voices understanding of current disease status and treatment options and is in agreement with the current care plan.  All questions were answered. The patient knows to call the clinic with any problems, questions or concerns. We can certainly see the patient much sooner if necessary.  Thank you so much for allowing me to participate in the care of Beatrix Fetters. I will continue to follow up the patient with you and assist in his care.  I  spent 40 minutes counseling the patient face to face. The total time spent in the appointment was 60 minutes.  Disclaimer: This note was dictated with voice recognition software. Similar sounding words can inadvertently be transcribed and may not be corrected upon review.   Nida Manfredi K. October 06, 2014, 3:26 PM

## 2014-10-06 NOTE — Progress Notes (Signed)
Checked in new pt with just Medicare Part A.  Pt did not have his insurance card or ID with him today.  He will have the facility where he lives fax those over.  Pt states he applied for Medicaid and is awaiting approval.  In the meantime, I gave him an application to apply for assistance thru the hospital until Medicaid becomes effective.  Pt has Raquel's card for any billing questions or concerns.

## 2014-10-12 ENCOUNTER — Encounter: Payer: Self-pay | Admitting: *Deleted

## 2014-10-12 NOTE — [facility] Oncology Navigator Note (Unsigned)
Emailed radiology dept to see if ordered scans can be scheduled soon.

## 2014-10-15 ENCOUNTER — Ambulatory Visit (HOSPITAL_COMMUNITY)
Admission: RE | Admit: 2014-10-15 | Discharge: 2014-10-15 | Disposition: A | Discharge status: Home / Self Care | Payer: Medicare Other | Source: Ambulatory Visit | Attending: Internal Medicine | Admitting: Internal Medicine

## 2014-10-15 ENCOUNTER — Encounter (HOSPITAL_COMMUNITY)
Admission: RE | Admit: 2014-10-15 | Discharge: 2014-10-15 | Disposition: A | Discharge status: Home / Self Care | Payer: Medicare Other | Source: Ambulatory Visit | Attending: Internal Medicine | Admitting: Internal Medicine

## 2014-10-15 DIAGNOSIS — C3491 Malignant neoplasm of unspecified part of right bronchus or lung: Secondary | ICD-10-CM

## 2014-10-15 DIAGNOSIS — C7951 Secondary malignant neoplasm of bone: Secondary | ICD-10-CM | POA: Insufficient documentation

## 2014-10-15 DIAGNOSIS — C801 Malignant (primary) neoplasm, unspecified: Secondary | ICD-10-CM

## 2014-10-15 DIAGNOSIS — K4090 Unilateral inguinal hernia, without obstruction or gangrene, not specified as recurrent: Secondary | ICD-10-CM | POA: Insufficient documentation

## 2014-10-15 DIAGNOSIS — D649 Anemia, unspecified: Secondary | ICD-10-CM | POA: Insufficient documentation

## 2014-10-15 DIAGNOSIS — C787 Secondary malignant neoplasm of liver and intrahepatic bile duct: Secondary | ICD-10-CM | POA: Insufficient documentation

## 2014-10-15 DIAGNOSIS — C3490 Malignant neoplasm of unspecified part of unspecified bronchus or lung: Secondary | ICD-10-CM | POA: Insufficient documentation

## 2014-10-15 DIAGNOSIS — C7889 Secondary malignant neoplasm of other digestive organs: Secondary | ICD-10-CM | POA: Insufficient documentation

## 2014-10-15 LAB — GLUCOSE, CAPILLARY: Glucose-Capillary: 87 mg/dL (ref 70–99)

## 2014-10-15 MED ORDER — FLUDEOXYGLUCOSE F - 18 (FDG) INJECTION
7.8000 | Freq: Once | INTRAVENOUS | Status: AC | PRN
  Administered 2014-10-15: 7.8 via INTRAVENOUS

## 2014-10-15 MED ORDER — GADOBENATE DIMEGLUMINE 529 MG/ML IV SOLN
15.0000 mL | Freq: Once | INTRAVENOUS | Status: AC | PRN
  Administered 2014-10-15: 13 mL via INTRAVENOUS

## 2014-10-18 ENCOUNTER — Ambulatory Visit (HOSPITAL_BASED_OUTPATIENT_CLINIC_OR_DEPARTMENT_OTHER): Payer: Self-pay | Admitting: Nurse Practitioner

## 2014-10-18 ENCOUNTER — Ambulatory Visit: Payer: Self-pay

## 2014-10-18 ENCOUNTER — Encounter: Payer: Self-pay | Admitting: Nurse Practitioner

## 2014-10-18 ENCOUNTER — Telehealth: Payer: Self-pay | Admitting: Internal Medicine

## 2014-10-18 ENCOUNTER — Other Ambulatory Visit (HOSPITAL_BASED_OUTPATIENT_CLINIC_OR_DEPARTMENT_OTHER): Payer: Self-pay

## 2014-10-18 VITALS — BP 98/63 | HR 70 | Temp 97.7°F | Resp 18 | Ht 69.0 in | Wt 143.1 lb

## 2014-10-18 DIAGNOSIS — C3491 Malignant neoplasm of unspecified part of right bronchus or lung: Secondary | ICD-10-CM

## 2014-10-18 DIAGNOSIS — C7951 Secondary malignant neoplasm of bone: Secondary | ICD-10-CM

## 2014-10-18 DIAGNOSIS — C3411 Malignant neoplasm of upper lobe, right bronchus or lung: Secondary | ICD-10-CM

## 2014-10-18 DIAGNOSIS — R609 Edema, unspecified: Secondary | ICD-10-CM

## 2014-10-18 DIAGNOSIS — R600 Localized edema: Secondary | ICD-10-CM

## 2014-10-18 DIAGNOSIS — C801 Malignant (primary) neoplasm, unspecified: Principal | ICD-10-CM

## 2014-10-18 LAB — CBC WITH DIFFERENTIAL/PLATELET
BASO%: 0.4 % (ref 0.0–2.0)
Basophils Absolute: 0 10*3/uL (ref 0.0–0.1)
EOS ABS: 0.5 10*3/uL (ref 0.0–0.5)
EOS%: 4.5 % (ref 0.0–7.0)
HCT: 34.8 % — ABNORMAL LOW (ref 38.4–49.9)
HGB: 10.9 g/dL — ABNORMAL LOW (ref 13.0–17.1)
LYMPH%: 21.9 % (ref 14.0–49.0)
MCH: 26.6 pg — AB (ref 27.2–33.4)
MCHC: 31.4 g/dL — ABNORMAL LOW (ref 32.0–36.0)
MCV: 84.7 fL (ref 79.3–98.0)
MONO#: 1.6 10*3/uL — ABNORMAL HIGH (ref 0.1–0.9)
MONO%: 14.2 % — ABNORMAL HIGH (ref 0.0–14.0)
NEUT%: 59 % (ref 39.0–75.0)
NEUTROS ABS: 6.7 10*3/uL — AB (ref 1.5–6.5)
PLATELETS: 383 10*3/uL (ref 140–400)
RBC: 4.11 10*6/uL — AB (ref 4.20–5.82)
RDW: 15.5 % — ABNORMAL HIGH (ref 11.0–14.6)
WBC: 11.4 10*3/uL — ABNORMAL HIGH (ref 4.0–10.3)
lymph#: 2.5 10*3/uL (ref 0.9–3.3)

## 2014-10-18 LAB — COMPREHENSIVE METABOLIC PANEL (CC13)
ALK PHOS: 789 U/L — AB (ref 40–150)
ALT: 33 U/L (ref 0–55)
AST: 34 U/L (ref 5–34)
Albumin: 2.2 g/dL — ABNORMAL LOW (ref 3.5–5.0)
Anion Gap: 5 mEq/L (ref 3–11)
BILIRUBIN TOTAL: 0.21 mg/dL (ref 0.20–1.20)
BUN: 22.4 mg/dL (ref 7.0–26.0)
CALCIUM: 8.6 mg/dL (ref 8.4–10.4)
CO2: 20 mEq/L — ABNORMAL LOW (ref 22–29)
CREATININE: 1 mg/dL (ref 0.7–1.3)
Chloride: 112 mEq/L — ABNORMAL HIGH (ref 98–109)
EGFR: >90 mL/min/{1.73_m2} (ref >90)
Glucose: 90 mg/dl (ref 70–140)
Potassium: 5.8 mEq/L — ABNORMAL HIGH (ref 3.5–5.1)
Sodium: 137 mEq/L (ref 136–145)
Total Protein: 6.9 g/dL (ref 6.4–8.3)

## 2014-10-18 MED ORDER — CYANOCOBALAMIN 1000 MCG/ML IJ SOLN
INTRAMUSCULAR | Status: AC
  Filled 2014-10-18: qty 1

## 2014-10-18 MED ORDER — CYANOCOBALAMIN 1000 MCG/ML IJ SOLN
1000.0000 ug | Freq: Once | INTRAMUSCULAR | Status: AC
  Administered 2014-10-18: 1000 ug via INTRAMUSCULAR

## 2014-10-18 NOTE — Progress Notes (Addendum)
[facility] OFFICE PROGRESS NOTE   Diagnosis: Stage IV (T2b, N2, M1b) non-small cell lung cancer, adenocarcinoma with a large right hilar mass as well as mediastinal lymphadenopathy and metastatic disease to bone and possibly liver and pancreas   INTERVAL HISTORY:   Francisco Love returns as scheduled. He denies significant shortness of breath. No chest pain. He continues supplemental oxygen at 3 L/m. He reports pain at Francisco left hip over Francisco past month. Left leg edema has improved. Right leg edema has increased. He reports a good appetite. No nausea or vomiting. Bowels are moving.  Objective:  Vital signs in last 24 hours:  Blood pressure 98/63, pulse 70, temperature 97.7 F (36.5 C), temperature source Oral, resp. rate 18, height 5\' 9"  (1.753 m), weight 143 lb 1.6 oz (64.91 kg), SpO2 100 %.    HEENT: No thrush or ulcers. Resp: Lungs are clear. Breath sounds diminished at Francisco bases. Cardio: Regular rate and rhythm. GI: Abdomen soft and nontender. No hepatomegaly. Vascular: 1+ pitting edema left lower leg. 2-3+ pitting edema right leg below Francisco knee. Neuro: Alert and oriented.     Lab Results:  Lab Results  Component Value Date   WBC 11.4* 10/18/2014   HGB 10.9* 10/18/2014   HCT 34.8* 10/18/2014   MCV 84.7 10/18/2014   PLT 383 10/18/2014   NEUTROABS 6.7* 10/18/2014    Imaging:  No results found.  Medications: I have reviewed Francisco Love's current medications.  Assessment/Plan: 1. Stage IV (T2b, N2, M1b) non-small cell lung cancer, adenocarcinoma with a large right hilar mass as well as mediastinal lymphadenopathy and metastatic disease to bone and possibly liver and pancreas.   MRI brain 10/15/2014 negative for metastatic disease.   PET scan 10/15/2014 with markedly hypermetabolic right paratracheal lesion associated with hypermetabolic metastases in Francisco right hilum and scattered sclerotic hypermetabolic bone metastases. Cystic lesion in Francisco pancreatic head  not hypermetabolic. No definite hypermetabolic metastatic lesion in Francisco liver. 2. Bilateral lower extremity edema right greater than left.     Disposition: Francisco Love has metastatic non-small cell lung cancer involving bone with possible metastatic disease to Francisco liver and pancreas as well. We reviewed Francisco brain MRI and PET scan results. Dr. Julien Nordmann recommends initiation of chemotherapy with carboplatin/Alimta on a 3 week schedule. We reviewed potential toxicities including myelosuppression, nausea, mouth sores, diarrhea or constipation, hair loss, allergic reaction. He understands Francisco rationale for D-53 and folic acid. He will receive Francisco initial B-12 injection today. He will begin folic acid today as well. He will take dexamethasone 4 mg twice daily Francisco day before, day of and day after chemotherapy to prevent/minimize Francisco skin rash. He will attend a chemotherapy education class. We are referring him for placement of a Port-A-Cath. He will return to [place] lab today to have blood drawn for [employer] 360 testing.  We are referring him for a venous Doppler of Francisco right leg.  He will return for Francisco first cycle of carboplatin/Alimta on 10/25/2014. We will see him in follow-up on 11/01/2014. He will contact Francisco office in Francisco interim with any problems.  Love seen with Dr. Julien Nordmann.    Ned Card ANP/GNP-BC   10/18/2014  3:54 PM  ADDENDUM: Hematology/Oncology Attending: I had a face to face encounter with Francisco Love. I recommended his care plan. This is a very pleasant 54 years old African-American male who was recently diagnosed with metastatic non-small cell lung cancer, adenocarcinoma. There was insufficient tissue to be tested for molecular markers. We  will send blood sample to [employer] for molecular testing. His recent MRI of Francisco brain showed no evidence for metastatic disease to Francisco brain. His PET scan showed hypermetabolic activity in Francisco right hilar mass and mediastinal lymphadenopathy  as well as bone disease. Francisco cystic lesion in Francisco pancreas was not hypermetabolic. I discussed Francisco imaging studies with Francisco Love today. I also discussed with him his treatment options including palliative care and hospice referral versus proceeding with systemic chemotherapy with carboplatin and Alimta. Francisco Love is interested in proceeding with palliative systemic chemotherapy. He will be treated with carboplatin for AUC of 5 and Alimta 500 MG/M2 every 3 weeks. We discussed with Francisco Love adverse effect of this treatment including but not limited to alopecia, myelosuppression, nausea and vomiting, peripheral neuropathy, liver or renal dysfunction. He is expected to start Francisco first cycle of this treatment next week. He would have vitamin B 12 injection given today. We will also call his pharmacy was prescription for Compazine, Decadron and folic acid. He will also have a chemotherapy education class before starting Francisco first dose of his treatment. For Francisco lower extremity edema, will check lower extremity Doppler to rule out deep venous thrombosis. Francisco Love would come back for follow-up visit in 2 weeks for reevaluation and management of any adverse effect of his treatment.

## 2014-10-18 NOTE — Telephone Encounter (Signed)
gv adn printed appt sched and avs for pt for March....per Tiffany in [place] will draw labs and keep pt accessed

## 2014-10-18 NOTE — Patient Instructions (Signed)

## 2014-10-19 ENCOUNTER — Non-Acute Institutional Stay (SKILLED_NURSING_FACILITY): Payer: Medicaid Other | Admitting: Nurse Practitioner

## 2014-10-19 ENCOUNTER — Telehealth: Payer: Self-pay | Admitting: Internal Medicine

## 2014-10-19 DIAGNOSIS — G894 Chronic pain syndrome: Secondary | ICD-10-CM | POA: Diagnosis not present

## 2014-10-19 DIAGNOSIS — R609 Edema, unspecified: Secondary | ICD-10-CM

## 2014-10-19 DIAGNOSIS — G4700 Insomnia, unspecified: Secondary | ICD-10-CM

## 2014-10-19 DIAGNOSIS — K5900 Constipation, unspecified: Secondary | ICD-10-CM | POA: Diagnosis not present

## 2014-10-19 DIAGNOSIS — C3491 Malignant neoplasm of unspecified part of right bronchus or lung: Secondary | ICD-10-CM | POA: Diagnosis not present

## 2014-10-19 MED ORDER — SENNOSIDES-DOCUSATE SODIUM 8.6-50 MG PO TABS: 1.0000 | ORAL_TABLET | Freq: Every day | ORAL | Status: AC

## 2014-10-19 MED ORDER — MIRTAZAPINE 15 MG PO TABS: 15.0000 mg | ORAL_TABLET | Freq: Every day | ORAL | Status: DC

## 2014-10-19 NOTE — Progress Notes (Signed)
Patient ID: Francisco Love, male   DOB: 06-11-1961, 54 y.o.   MRN: [id number]    Nursing Home Location:  [facility]   Place of Service: SNF (31)  PCP: [name], [name], MD  No Known Allergies  Chief Complaint  Patient presents with  . Medical Management of Chronic Issues    HPI:  Patient is a 54 y.o. male seen today at [facility] for routine follow up on chronic conditions. Pt with metastatic lung cancer following with oncology and planning to start chemotherapy.  Recently with worsening right leg swelling. Oncologist has ordered and scheduled doppler to be done on 10/21/14. Pt reports pain all over. Worsening pain to spine and hips which could be related to OA or more likely metastatic disease. Pt reports oxycodone has made pain bearable but still there. Pt reports worsening constipation. Using facility protocol which has helped but not on daily regimen. Also reporting trouble sleeping. Denies anxiety or depression.   Review of Systems:  Review of Systems  Constitutional: Negative for activity change, appetite change, fatigue and unexpected weight change.  Respiratory: Positive for shortness of breath. Negative for cough.   Cardiovascular: Positive for leg swelling. Negative for chest pain and palpitations.  Gastrointestinal: Negative for abdominal pain.  Musculoskeletal: Positive for myalgias. Negative for arthralgias.  Skin: Negative for color change and wound.  Neurological: Positive for weakness. Negative for dizziness.    Past Medical History  Diagnosis Date  . Shortness of breath dyspnea    Past Surgical History  Procedure Laterality Date  . Appendectomy    . Video bronchoscopy N/A 09/21/2014    Procedure: VIDEO BRONCHOSCOPY WITH FLUORO;  Surgeon: Rigoberto Noel, MD;  Location: [place];  Service: Cardiopulmonary;  Laterality: N/A;  . Video bronchoscopy with endobronchial ultrasound N/A 09/23/2014    Procedure: VIDEO BRONCHOSCOPY  WITH ENDOBRONCHIAL ULTRASOUND;  Surgeon: Rigoberto Noel, MD;  Location: [place];  Service: Pulmonary;  Laterality: N/A;   Social History:   reports that he quit smoking about 3 months ago. His smoking use included Cigarettes. He smoked 1.00 pack per day. He does not have any smokeless tobacco history on file. He reports that he uses illicit drugs (Marijuana). He reports that he does not drink alcohol.  No family history on file.  Medications: Patient's Medications  New Prescriptions   No medications on file  Previous Medications   ACETAMINOPHEN (TYLENOL) 325 MG TABLET    Take 2 tablets (650 mg total) by mouth every 6 (six) hours as needed for mild pain (or Fever >/= 101).   ALBUTEROL (PROVENTIL) (2.5 MG/3ML) 0.083% NEBULIZER SOLUTION    Take 3 mLs (2.5 mg total) by nebulization every 2 (two) hours as needed for wheezing.   BISACODYL (DULCOLAX) 10 MG SUPPOSITORY    Place 10 mg rectally daily as needed for moderate constipation.   DIPHENHYDRAMINE (SOMINEX) 25 MG TABLET    Take 25 mg by mouth as needed for itching or sleep.   FEEDING SUPPLEMENT, ENSURE COMPLETE, (ENSURE COMPLETE) LIQD    Take 237 mLs by mouth 3 (three) times daily between meals.   GUAIFENESIN-DEXTROMETHORPHAN (ROBITUSSIN DM) 100-10 MG/5ML SYRUP    Take 5 mLs by mouth every 4 (four) hours as needed for cough.   MAGNESIUM HYDROXIDE (MILK OF MAGNESIA PO)    Take by mouth as needed.   OXYCODONE (OXY IR/ROXICODONE) 5 MG IMMEDIATE RELEASE TABLET    Take 1 tablet (5 mg total) by mouth every 4 (four)  hours as needed for moderate pain.  Modified Medications   No medications on file  Discontinued Medications   No medications on file     Physical Exam: Filed Vitals:   10/19/14 1530  BP: 120/68  Pulse: 62  Temp: 97.2 F (36.2 C)  Resp: 20  Weight: 142 lb (64.411 kg)    Physical Exam  Constitutional: He is oriented to person, place, and time. He appears well-developed and well-nourished. No distress.  HENT:  Head: Normocephalic  and atraumatic.  Mouth/Throat: Oropharynx is clear and moist. No oropharyngeal exudate.  Neck: Normal range of motion. Neck supple.  Cardiovascular: Normal rate, regular rhythm and normal heart sounds.   Pulmonary/Chest: Effort normal. He has decreased breath sounds.  Abdominal: Soft. Bowel sounds are normal.  Musculoskeletal: He exhibits edema and tenderness.  Bilateral feet swelling 2+, worse on right vs left, tender right leg.  Neurological: He is alert and oriented to person, place, and time.  Skin: Skin is warm and dry. He is not diaphoretic.  exteremly long curling toe nails on bilateral feet  Psychiatric: He has a normal mood and affect.    Labs reviewed: Basic Metabolic Panel:  Recent Labs  09/18/14 1900 09/19/14 0530 10/06/14 1408 10/18/14 1340  NA 138 137 139 137  K 5.0 4.5 4.7 5.8*  CL 113* 114*  --   --   CO2 18* 18* 19* 20*  GLUCOSE 116* 108* 104 90  BUN 29* 28* 19.4 22.4  CREATININE 1.55* 1.28 1.0 1.0  CALCIUM 8.1* 8.1* 8.4 8.6   Liver Function Tests:  Recent Labs  09/20/14 1120 10/06/14 1408 10/18/14 1340  AST 29 33 34  ALT 34 43 33  ALKPHOS 882* 654* 789*  BILITOT 0.1* <0.20 0.21  PROT 6.5 7.1 6.9  ALBUMIN 2.3* 2.1* 2.2*   No results for input(s): LIPASE, AMYLASE in the last 8760 hours. No results for input(s): AMMONIA in the last 8760 hours. CBC:  Recent Labs  09/19/14 0530 10/06/14 1408 10/18/14 1339  WBC 12.2* 12.8* 11.4*  NEUTROABS  --  8.3* 6.7*  HGB 12.0* 11.4* 10.9*  HCT 36.2* 35.6* 34.8*  MCV 83.4 84.4 84.7  PLT 393 437* 383   TSH: No results for input(s): TSH in the last 8760 hours. A1C: No results found for: HGBA1C Lipid Panel: No results for input(s): CHOL, HDL, LDLCALC, TRIG, CHOLHDL, LDLDIRECT in the last 8760 hours.  CBC with Diff    Result: 09/27/2014 3:50 PM   ( Status: F )       WBC 12.2   H 4.0-10.5 K/uL SLN   RBC 4.27     4.22-5.81 MIL/uL SLN   Hemoglobin 11.6   L 13.0-17.0 g/dL SLN   Hematocrit 36.4    L 39.0-52.0 % SLN   MCV 85.2     78.0-100.0 fL SLN   MCH 27.2     26.0-34.0 pg SLN   MCHC 31.9     30.0-36.0 g/dL SLN   RDW 15.8   H 11.5-15.5 % SLN   Platelet Count 377     150-400 K/uL SLN C MPV 10.5     8.6-12.4 fL SLN   Granulocyte % 58     43-77 % SLN   Absolute Gran 7.1     1.7-7.7 K/uL SLN   Lymph % 25     12-46 % SLN   Absolute Lymph 3.1     0.7-4.0 K/uL SLN   Mono % 14   H 3-12 % SLN  Absolute Mono 1.7   H 0.1-1.0 K/uL SLN   Eos % 3     0-5 % SLN   Absolute Eos 0.4     0.0-0.7 K/uL SLN   Baso % 0     0-1 % SLN   Absolute Baso 0.0     0.0-0.1 K/uL SLN   Smear Review   SLN C Basic Metabolic Panel    Result: 09/27/2014 4:30 PM   ( Status: F )       Sodium 135     135-145 mEq/L SLN   Potassium 5.5   H 3.5-5.3 mEq/L SLN   Chloride 108     96-112 mEq/L SLN   CO2 21     19-32 mEq/L SLN   Glucose 83     70-99 mg/dL SLN   BUN 12     6-23 mg/dL SLN   Creatinine 0.93     0.50-1.35 mg/dL SLN   Calcium 7.8   L 8.4-10.5 mg/dL SLN   Assessment/Plan  1. Non-small cell cancer of right lung Following with oncology, starting chemo   2. Edema Right worse than left, dopplers scheduled  3. Constipation, unspecified constipation type -will start senna s po daily  4. Chronic pain syndrome -due to metastatic lung disease, conts on oxycodone as needed  5. Insomnia -will start Remeron 15 mg qhs due to weight loss and insomnia, pt will benefit from Remeron for appitite also once he starts chemo.

## 2014-10-19 NOTE — Telephone Encounter (Signed)
s.w. pt living facility and advised transport of appts...ok and ware

## 2014-10-20 ENCOUNTER — Other Ambulatory Visit: Payer: Self-pay | Admitting: Medical Oncology

## 2014-10-20 DIAGNOSIS — C801 Malignant (primary) neoplasm, unspecified: Principal | ICD-10-CM

## 2014-10-20 DIAGNOSIS — C7951 Secondary malignant neoplasm of bone: Secondary | ICD-10-CM

## 2014-10-20 MED ORDER — FOLIC ACID 1 MG PO TABS: 1.0000 mg | ORAL_TABLET | Freq: Every day | ORAL | Status: DC

## 2014-10-20 MED ORDER — DEXAMETHASONE 4 MG PO TABS: 4.0000 mg | ORAL_TABLET | Freq: Two times a day (BID) | ORAL | Status: DC

## 2014-10-20 NOTE — Progress Notes (Signed)
[facility] and they have sent rx to their [place] [facility] [place] pharmacy . The rxs were ordered on the form completed at pts last visit with [facility].

## 2014-10-21 ENCOUNTER — Other Ambulatory Visit: Payer: Medicare Other

## 2014-10-21 ENCOUNTER — Other Ambulatory Visit: Payer: Self-pay | Admitting: Radiology

## 2014-10-21 ENCOUNTER — Encounter: Payer: Self-pay | Admitting: *Deleted

## 2014-10-21 ENCOUNTER — Ambulatory Visit (HOSPITAL_COMMUNITY): Payer: Medicare Other | Attending: Nurse Practitioner

## 2014-10-22 ENCOUNTER — Other Ambulatory Visit: Payer: Self-pay | Admitting: Radiology

## 2014-10-22 ENCOUNTER — Other Ambulatory Visit: Payer: Self-pay | Admitting: *Deleted

## 2014-10-22 DIAGNOSIS — C3491 Malignant neoplasm of unspecified part of right bronchus or lung: Secondary | ICD-10-CM

## 2014-10-22 MED ORDER — PROCHLORPERAZINE MALEATE 10 MG PO TABS: 10.0000 mg | ORAL_TABLET | Freq: Four times a day (QID) | ORAL | Status: AC | PRN

## 2014-10-22 NOTE — Telephone Encounter (Signed)
[facility]ed [facility]  Per Dr. Julien Nordmann -Rx for Compazine 10mg  q 6hrs. Reviewed all meds pt should have /take while on chemo with Rollene Fare, Therapist, sports at [place]. No further concerns.

## 2014-10-25 ENCOUNTER — Other Ambulatory Visit: Payer: Self-pay | Admitting: Internal Medicine

## 2014-10-25 ENCOUNTER — Ambulatory Visit (HOSPITAL_COMMUNITY): Payer: Medicaid Other

## 2014-10-25 ENCOUNTER — Ambulatory Visit (HOSPITAL_COMMUNITY)
Admission: RE | Admit: 2014-10-25 | Discharge: 2014-10-25 | Disposition: A | Discharge status: Home / Self Care | Payer: Medicaid Other | Source: Ambulatory Visit | Attending: Internal Medicine | Admitting: Internal Medicine

## 2014-10-25 ENCOUNTER — Ambulatory Visit (HOSPITAL_COMMUNITY)
Admission: RE | Admit: 2014-10-25 | Discharge: 2014-10-25 | Disposition: A | Discharge status: Home / Self Care | Payer: Medicaid Other | Source: Ambulatory Visit | Attending: Interventional Radiology | Admitting: Interventional Radiology

## 2014-10-25 ENCOUNTER — Other Ambulatory Visit: Payer: Medicare Other

## 2014-10-25 ENCOUNTER — Ambulatory Visit (HOSPITAL_BASED_OUTPATIENT_CLINIC_OR_DEPARTMENT_OTHER): Payer: Self-pay

## 2014-10-25 ENCOUNTER — Ambulatory Visit: Payer: Medicare Other | Admitting: Nurse Practitioner

## 2014-10-25 ENCOUNTER — Other Ambulatory Visit: Payer: Self-pay | Admitting: Nurse Practitioner

## 2014-10-25 ENCOUNTER — Encounter (HOSPITAL_COMMUNITY): Payer: Self-pay

## 2014-10-25 DIAGNOSIS — C3491 Malignant neoplasm of unspecified part of right bronchus or lung: Secondary | ICD-10-CM

## 2014-10-25 DIAGNOSIS — C7951 Secondary malignant neoplasm of bone: Secondary | ICD-10-CM | POA: Diagnosis not present

## 2014-10-25 DIAGNOSIS — C7889 Secondary malignant neoplasm of other digestive organs: Secondary | ICD-10-CM | POA: Diagnosis not present

## 2014-10-25 DIAGNOSIS — C787 Secondary malignant neoplasm of liver and intrahepatic bile duct: Secondary | ICD-10-CM | POA: Diagnosis not present

## 2014-10-25 DIAGNOSIS — Z79899 Other long term (current) drug therapy: Secondary | ICD-10-CM | POA: Insufficient documentation

## 2014-10-25 DIAGNOSIS — C3411 Malignant neoplasm of upper lobe, right bronchus or lung: Secondary | ICD-10-CM

## 2014-10-25 DIAGNOSIS — Z5111 Encounter for antineoplastic chemotherapy: Secondary | ICD-10-CM

## 2014-10-25 DIAGNOSIS — Z87891 Personal history of nicotine dependence: Secondary | ICD-10-CM | POA: Diagnosis not present

## 2014-10-25 LAB — BASIC METABOLIC PANEL
Anion gap: 8 (ref 5–15)
BUN: 22 mg/dL (ref 6–23)
CHLORIDE: 108 mmol/L (ref 96–112)
CO2: 19 mmol/L (ref 19–32)
CREATININE: 1.03 mg/dL (ref 0.50–1.35)
Calcium: 8.6 mg/dL (ref 8.4–10.5)
GFR calc Af Amer: >90 mL/min (ref >90)
GFR calc non Af Amer: 80 mL/min — ABNORMAL LOW (ref >90)
GLUCOSE: 99 mg/dL (ref 70–99)
POTASSIUM: 5.3 mmol/L — AB (ref 3.5–5.1)
Sodium: 135 mmol/L (ref 135–145)

## 2014-10-25 LAB — CBC
HEMATOCRIT: 34.2 % — AB (ref 39.0–52.0)
Hemoglobin: 10.9 g/dL — ABNORMAL LOW (ref 13.0–17.0)
MCH: 26.5 pg (ref 26.0–34.0)
MCHC: 31.9 g/dL (ref 30.0–36.0)
MCV: 83.2 fL (ref 78.0–100.0)
PLATELETS: 341 10*3/uL (ref 150–400)
RBC: 4.11 MIL/uL — ABNORMAL LOW (ref 4.22–5.81)
RDW: 15.4 % (ref 11.5–15.5)
WBC: 14.8 10*3/uL — ABNORMAL HIGH (ref 4.0–10.5)

## 2014-10-25 LAB — PROTIME-INR
INR: 1.15 (ref 0.00–1.49)
Prothrombin Time: 14.9 seconds (ref 11.6–15.2)

## 2014-10-25 LAB — APTT: aPTT: 36 seconds (ref 24–37)

## 2014-10-25 MED ORDER — CEFAZOLIN SODIUM-DEXTROSE 2-3 GM-% IV SOLR
2.0000 g | Freq: Once | INTRAVENOUS | Status: AC
  Administered 2014-10-25: 2 g via INTRAVENOUS

## 2014-10-25 MED ORDER — SODIUM CHLORIDE 0.9 % IV SOLN
500.0000 mg/m2 | Freq: Once | INTRAVENOUS | Status: AC
  Administered 2014-10-25: 900 mg via INTRAVENOUS
  Filled 2014-10-25: qty 36

## 2014-10-25 MED ORDER — DEXAMETHASONE SODIUM PHOSPHATE 20 MG/5ML IJ SOLN: 20.0000 mg | Freq: Once | INTRAMUSCULAR | Status: DC

## 2014-10-25 MED ORDER — ONDANSETRON 16 MG/50ML IVPB (CHCC): 16.0000 mg | Freq: Once | INTRAVENOUS | Status: DC

## 2014-10-25 MED ORDER — LIDOCAINE-EPINEPHRINE 2 %-1:100000 IJ SOLN
INTRAMUSCULAR | Status: AC
  Filled 2014-10-25: qty 1

## 2014-10-25 MED ORDER — SODIUM CHLORIDE 0.9 % IV SOLN
Freq: Once | INTRAVENOUS | Status: AC
  Administered 2014-10-25: 14:00:00 via INTRAVENOUS
  Filled 2014-10-25: qty 8

## 2014-10-25 MED ORDER — SODIUM CHLORIDE 0.9 % IV SOLN
Freq: Once | INTRAVENOUS | Status: AC
  Administered 2014-10-25: 09:00:00 via INTRAVENOUS

## 2014-10-25 MED ORDER — FENTANYL CITRATE 0.05 MG/ML IJ SOLN
INTRAMUSCULAR | Status: AC | PRN
  Administered 2014-10-25 (×2): 25 ug via INTRAVENOUS
  Administered 2014-10-25: 50 ug via INTRAVENOUS

## 2014-10-25 MED ORDER — CEFAZOLIN SODIUM-DEXTROSE 2-3 GM-% IV SOLR
INTRAVENOUS | Status: AC
  Filled 2014-10-25: qty 50

## 2014-10-25 MED ORDER — HEPARIN SOD (PORK) LOCK FLUSH 100 UNIT/ML IV SOLN
INTRAVENOUS | Status: AC
Start: 2014-10-25 — End: 2014-10-25
  Filled 2014-10-25: qty 5

## 2014-10-25 MED ORDER — HEPARIN SOD (PORK) LOCK FLUSH 100 UNIT/ML IV SOLN
500.0000 [IU] | Freq: Once | INTRAVENOUS | Status: AC | PRN
  Administered 2014-10-25: 500 [IU]
  Filled 2014-10-25: qty 5

## 2014-10-25 MED ORDER — MIDAZOLAM HCL 2 MG/2ML IJ SOLN
INTRAMUSCULAR | Status: AC | PRN
  Administered 2014-10-25 (×4): 1 mg via INTRAVENOUS

## 2014-10-25 MED ORDER — SODIUM CHLORIDE 0.9 % IJ SOLN
10.0000 mL | INTRAMUSCULAR | Status: DC | PRN
  Administered 2014-10-25: 10 mL
  Filled 2014-10-25: qty 10

## 2014-10-25 MED ORDER — SODIUM CHLORIDE 0.9 % IV SOLN
497.0000 mg | Freq: Once | INTRAVENOUS | Status: AC
  Administered 2014-10-25: 500 mg via INTRAVENOUS
  Filled 2014-10-25: qty 50

## 2014-10-25 MED ORDER — FENTANYL CITRATE 0.05 MG/ML IJ SOLN
INTRAMUSCULAR | Status: AC
  Filled 2014-10-25: qty 4

## 2014-10-25 MED ORDER — SODIUM CHLORIDE 0.9 % IV SOLN
Freq: Once | INTRAVENOUS | Status: AC
  Administered 2014-10-25: 13:00:00 via INTRAVENOUS

## 2014-10-25 MED ORDER — MIDAZOLAM HCL 2 MG/2ML IJ SOLN
INTRAMUSCULAR | Status: AC
  Filled 2014-10-25: qty 6

## 2014-10-25 NOTE — H&P (Signed)
Chief Complaint: "I am here for a port."  Referring Physician(s): Ned Card NP  History of Present Illness: Francisco Love is a 54 y.o. male with stage IV non-small cell lung cancer, right hilar mass and mediastinal lymphadenopathy with metastatic disease. The patient has been seen by Ned Card NP on 10/18/14 and scheduled today for image guided port a catheter placement with chemotherapy to follow. He denies any chest pain, change in chronic shortness of breath or palpitations. He denies any active signs of bleeding or excessive bruising. He denies any recent fever or chills. The patient denies any history of sleep apnea, but does admit to chronic oxygen use at 3L with [place]. He has previously tolerated sedation without complications.   Past Medical History  Diagnosis Date  . Shortness of breath dyspnea     Past Surgical History  Procedure Laterality Date  . Appendectomy    . Video bronchoscopy N/A 09/21/2014    Procedure: VIDEO BRONCHOSCOPY WITH FLUORO;  Surgeon: Rigoberto Noel, MD;  Location: [place];  Service: Cardiopulmonary;  Laterality: N/A;  . Video bronchoscopy with endobronchial ultrasound N/A 09/23/2014    Procedure: VIDEO BRONCHOSCOPY WITH ENDOBRONCHIAL ULTRASOUND;  Surgeon: Rigoberto Noel, MD;  Location: [place];  Service: Pulmonary;  Laterality: N/A;    Allergies: Review of patient's allergies indicates no known allergies.  Medications: Prior to Admission medications   Medication Sig Start Date End Date Taking? Authorizing Provider  acetaminophen (TYLENOL) 325 MG tablet Take 2 tablets (650 mg total) by mouth every 6 (six) hours as needed for mild pain (or Fever >/= 101). 09/24/14  Yes Delfina Redwood, MD  albuterol (PROVENTIL) (2.5 MG/3ML) 0.083% nebulizer solution Take 3 mLs (2.5 mg total) by nebulization every 2 (two) hours as needed for wheezing. 09/24/14  Yes Delfina Redwood, MD  bisacodyl (DULCOLAX) 10 MG suppository Place 10 mg rectally daily as needed for  moderate constipation.   Yes Historical Provider, MD  dexamethasone (DECADRON) 4 MG tablet Take 1 tablet (4 mg total) by mouth 2 (two) times daily with a meal. 10/20/14  Yes Curt Bears, MD  diphenhydrAMINE (SOMINEX) 25 MG tablet Take 25 mg by mouth as needed for itching or sleep.   Yes Historical Provider, MD  feeding supplement, ENSURE COMPLETE, (ENSURE COMPLETE) LIQD Take 237 mLs by mouth 3 (three) times daily between meals. 09/24/14  Yes Delfina Redwood, MD  folic acid (FOLVITE) 1 MG tablet Take 1 tablet (1 mg total) by mouth daily. 10/20/14  Yes Curt Bears, MD  guaiFENesin-dextromethorphan [facility] DM) 100-10 MG/5ML syrup Take 5 mLs by mouth every 4 (four) hours as needed for cough. 09/24/14  Yes Delfina Redwood, MD  Magnesium Hydroxide (MILK OF MAGNESIA PO) Take 30 mLs by mouth every 6 (six) hours as needed (constipation).    Yes Historical Provider, MD  mirtazapine (REMERON) 15 MG tablet Take 1 tablet (15 mg total) by mouth at bedtime. 10/19/14  Yes Lauree Chandler, NP  oxyCODONE (OXY IR/ROXICODONE) 5 MG immediate release tablet Take 1 tablet (5 mg total) by mouth every 4 (four) hours as needed for moderate pain. 09/24/14  Yes Delfina Redwood, MD  prochlorperazine (COMPAZINE) 10 MG tablet Take 1 tablet (10 mg total) by mouth every 6 (six) hours as needed for nausea or vomiting. 10/22/14   Curt Bears, MD  senna-docusate (SENOKOT-S) 8.6-50 MG per tablet Take 1 tablet by mouth daily. 10/19/14   Lauree Chandler, NP     History reviewed.  No pertinent family history.  History   Social History  . Marital Status: Single    Spouse Name: N/A  . Number of Children: N/A  . Years of Education: N/A   Social History Main Topics  . Smoking status: Former Smoker -- 1.00 packs/day    Types: Cigarettes    Quit date: 06/20/2014  . Smokeless tobacco: Not on file  . Alcohol Use: No  . Drug Use: Yes    Special: Marijuana  . Sexual Activity: Not on file   Other Topics Concern  . None     Social History Narrative    Review of Systems: A 12 point ROS discussed and pertinent positives are indicated in the HPI above.  All other systems are negative.  Review of Systems  Vital Signs: BP 115/74 mmHg  Pulse 100  SpO2 96%  Physical Exam  Constitutional: He is oriented to person, place, and time. No distress.  HENT:  Head: Normocephalic and atraumatic.  Neck: No tracheal deviation present.  Cardiovascular: Normal rate and regular rhythm.  Exam reveals no gallop and no friction rub.   No murmur heard. Pulmonary/Chest: Effort normal and breath sounds normal. No respiratory distress. He has no wheezes. He has no rales.  Abdominal: Soft. Bowel sounds are normal. He exhibits no distension. There is no tenderness.  Musculoskeletal: He exhibits edema.  Neurological: He is alert and oriented to person, place, and time.  Skin: He is not diaphoretic.    Mallampati Score:  MD Evaluation Airway: WNL Heart: WNL Abdomen: WNL Chest/ Lungs: WNL ASA  Classification: 3 Mallampati/Airway Score: Two  Imaging: Mr Kizzie Fantasia Contrast  10/15/2014   CLINICAL DATA:  Non-small-cell cancer of the right lung. Staging. Osseous metastases on PET-CT.  EXAM: MRI HEAD WITHOUT AND WITH CONTRAST  TECHNIQUE: Multiplanar, multiecho pulse sequences of the brain and surrounding structures were obtained without and with intravenous contrast.  CONTRAST:  25mL MULTIHANCE GADOBENATE DIMEGLUMINE 529 MG/ML IV SOLN  COMPARISON:  None.  FINDINGS: There is diffusely diminished bone marrow signal intensity involving the visualized upper cervical spine. No focal calvarial lesions are identified.  There is no evidence of acute infarct, intracranial hemorrhage, mass, midline shift, or extra-axial fluid collection. There is mild generalized cerebral atrophy, within normal limits for age. There are a few small foci of T2 hyperintensity in the subcortical white matter of the high right frontal lobe, nonspecific and not  greater than expected for patient's age. No abnormal enhancement is identified.  Orbits are unremarkable. Paranasal sinuses and mastoid air cells are clear. Major intracranial vascular flow voids are preserved.  IMPRESSION: 1. No evidence of intracranial metastases. Unremarkable appearance of the brain for age. 2. Abnormal bone marrow signal in the upper cervical spine, nonspecific. Osseous metastases not excluded. Patient's anemia may also be least partially responsible for this appearance.   Electronically Signed   By: Logan Bores   On: 10/15/2014 11:34   Nm Pet Image Initial (pi) Skull Base To Thigh  10/15/2014   CLINICAL DATA:  Initial treatment strategy for stage IV non-small-cell lung cancer with large right hilar mass, mediastinal lymphadenopathy, and metastatic disease to liver, bone, and pancreas.  EXAM: NUCLEAR MEDICINE PET SKULL BASE TO THIGH  TECHNIQUE: 7.8 mCi F-18 FDG was injected intravenously. Full-ring PET imaging was performed from the skull base to thigh after the radiotracer. CT data was obtained and used for attenuation correction and anatomic localization.  FASTING BLOOD GLUCOSE:  Value: 87 mg/dl  COMPARISON:  Chest CT  from 09/17/2014. Abdomen and pelvis CT from 09/20/2014.  FINDINGS: NECK  No hypermetabolic lymph nodes in the neck.  CHEST  The previously described large right paratracheal mass with associated dystrophic calcifications markedly hypermetabolic with SUV max = 56.2. This is associated with hypermetabolic tissue in the anterior right hilum having SUV max = 3.6. Compressive atelectasis is noted in the lower lobes bilaterally in small bilateral pleural effusions are associated.  ABDOMEN/PELVIS  No definite hypermetabolic metastatic lesion in the liver. The cystic lesion with associated dystrophic calcification in the uncinate process of the pancreas is again noted. This is been described previously. There is no evidence for hypermetabolism on PET imaging today and this cystic  area in the pancreas actually appears somewhat photopenic relative to background soft tissue uptake. No associated dilatation of the main pancreatic duct.  Although no nodule or mass is seen in either adrenal gland, there is low level FDG accumulation bilaterally. Uptake is symmetric with SUV max = 2.6-2.8. Given the symmetry and relatively low uptake, this is felt to be physiologic rather than and metastatic, but continued close attention on followup imaging is recommended.  There is a left inguinal hernia which contains fluid and possibly a tiny knuckle of small bowel. There is no evidence for bowel wall thickening or obstruction related to the hernia. FDG accumulation is evident within the hernia sac, presumably secondary to inflammation/granulation.  Hydrocele noted within the scrotum bilaterally without substantial hypermetabolic activity.  SKELETON  Scattered sclerotic lesions are seen throughout the skeleton. These demonstrate varying degrees of FDG accumulation. The most hypermetabolic lesion is identified in the anterior right fourth rib with SUV max = 4.9. No underlying fracture at this location. Sclerotic lesion in the right aspect of the L5 vertebral body demonstrates SUV max = 4.1. Advanced degenerative changes are noted in the hips bilaterally.  IMPRESSION: Markedly hypermetabolic right paratracheal lesion, consistent with the patient's known malignancy. This is associated with hypermetabolic metastases in the right hilum and scattered sclerotic hypermetabolic bone metastases.  The cystic lesion in the pancreatic head is not hypermetabolic on PET imaging. As such, this finding may not be related to metastatic disease. Attention on followup imaging is suggested.  Mild FDG uptake in both adrenal glands with asymmetric appearance and no underlying nodule or mass. Uptake felt to be physiologic, but attention to these areas on follow-up is also recommended.  No evidence for hypermetabolic disease in the  liver on today's study.  FDG accumulation above background soft tissue levels identified in the left inguinal hernia. There is fluid in the hernia second uptake is likely related to inflammation/granulation given the lack of underlying soft tissue lesion.   Electronically Signed   By: Misty Stanley M.D.   On: 10/15/2014 09:05    Labs:  CBC:  Recent Labs  09/18/14 1900 09/19/14 0530 10/06/14 1408 10/18/14 1339  WBC 15.4* 12.2* 12.8* 11.4*  HGB 13.0 12.0* 11.4* 10.9*  HCT 38.9* 36.2* 35.6* 34.8*  PLT 389 393 437* 383    COAGS:  Recent Labs  09/18/14 1900  INR 1.16    BMP:  Recent Labs  09/18/14 1900 09/19/14 0530 10/06/14 1408 10/18/14 1340  NA 138 137 139 137  K 5.0 4.5 4.7 5.8*  CL 113* 114*  --   --   CO2 18* 18* 19* 20*  GLUCOSE 116* 108* 104 90  BUN 29* 28* 19.4 22.4  CALCIUM 8.1* 8.1* 8.4 8.6  CREATININE 1.55* 1.28 1.0 1.0  GFRNONAA 49* 61*  --   --  GFRAA 57* 71*  --   --     LIVER FUNCTION TESTS:  Recent Labs  09/18/14 1900 09/20/14 1120 10/06/14 1408 10/18/14 1340  BILITOT 0.3 0.1* <0.20 0.21  AST 40* 29 33 34  ALT 34 34 43 33  ALKPHOS 988* 882* 654* 789*  PROT 6.7 6.5 7.1 6.9  ALBUMIN 2.3* 2.3* 2.1* 2.2*    TUMOR MARKERS:  Recent Labs  09/21/14 1035  CA199 19.1*    Assessment and Plan: Stage IV non-small cell lung cancer, right hilar mass and mediastinal lymphadenopathy with metastatic disease. Seen by Ned Card NP on 10/18/14 Scheduled today for image guided port a catheter placement with moderate sedation with chemotherapy to follow The patient has been NPO, no blood thinners taken, labs and vitals have been reviewed. Risks and Benefits discussed with the patient including, but not limited to bleeding, infection, pneumothorax, or fibrin sheath development and need for additional procedures. All of the patient's questions were answered, patient is agreeable to proceed. Consent signed and in chart. B/L LE edema, venous duplex  ordered, unable to see if this was done, per patient this has been performed with no evidence of DVT seen.    Thank you for this interesting consult.  I greatly enjoyed meeting Sheena Simonis and look forward to participating in their care.  SignedHedy Jacob 10/25/2014, 9:11 AM   I spent a total of 20 Minutes in face to face in clinical consultation, greater than 50% of which was counseling/coordinating care for stage IV non small cell lung cancer.

## 2014-10-25 NOTE — Patient Instructions (Signed)
[place] Discharge Instructions for Patients Receiving Chemotherapy  Today you received the following chemotherapy agents Carboplatin/Alimta.  To help prevent nausea and vomiting after your treatment, we encourage you to take your nausea medication as directed.    If you develop nausea and vomiting that is not controlled by your nausea medication, call the clinic.   BELOW ARE SYMPTOMS THAT SHOULD BE REPORTED IMMEDIATELY:  *FEVER GREATER THAN 100.5 F  *CHILLS WITH OR WITHOUT FEVER  NAUSEA AND VOMITING THAT IS NOT CONTROLLED WITH YOUR NAUSEA MEDICATION  *UNUSUAL SHORTNESS OF BREATH  *UNUSUAL BRUISING OR BLEEDING  TENDERNESS IN MOUTH AND THROAT WITH OR WITHOUT PRESENCE OF ULCERS  *URINARY PROBLEMS  *BOWEL PROBLEMS  UNUSUAL RASH Items with * indicate a potential emergency and should be followed up as soon as possible.  Feel free to call the clinic you have any questions or concerns. The clinic phone number is [phone].

## 2014-10-25 NOTE — Discharge Instructions (Signed)
Leave dressing on for 24 hours.  You may shower after 24 hours.  Please remove the dressing before you shower.   ° ° °Implanted Port Insertion, Care After °Refer to this sheet in the next few weeks. These instructions provide you with information on caring for yourself after your procedure. Your health care provider may also give you more specific instructions. Your treatment has been planned according to current medical practices, but problems sometimes occur. Call your health care provider if you have any problems or questions after your procedure. °WHAT TO EXPECT AFTER THE PROCEDURE °After your procedure, it is typical to have the following:  °· Discomfort at the port insertion site. Ice packs to the area will help. °· Bruising on the skin over the port. This will subside in 3-4 days. °HOME CARE INSTRUCTIONS °· After your port is placed, you will get a manufacturer's information card. The card has information about your port. Keep this card with you at all times.   °· Know what kind of port you have. There are many types of ports available.   °· Wear a medical alert bracelet in case of an emergency. This can help alert health care workers that you have a port.   °· The port can stay in for as long as your health care provider believes it is necessary.   °· A home health care nurse may give medicines and take care of the port.   °· You or a family member can get special training and directions for giving medicine and taking care of the port at home.   °SEEK MEDICAL CARE IF:  °· Your port does not flush or you are unable to get a blood return.   °· You have a fever or chills. °SEEK IMMEDIATE MEDICAL CARE IF: °· You have new fluid or pus coming from your incision.   °· You notice a bad smell coming from your incision site.   °· You have swelling, pain, or more redness at the incision or port site.   °· You have chest pain or shortness of breath. °Document Released: 05/27/2013 Document Revised: 08/11/2013 Document  Reviewed: 05/27/2013 °ExitCare® Patient Information ©2015 [employer]. This information is not intended to replace advice given to you by your health care provider. Make sure you discuss any questions you have with your health care provider. °Implanted Port Home Guide °An implanted port is a type of central line that is placed under the skin. Central lines are used to provide IV access when treatment or nutrition needs to be given through a person's veins. Implanted ports are used for long-term IV access. An implanted port may be placed because:  °· You need IV medicine that would be irritating to the small veins in your hands or arms.   °· You need long-term IV medicines, such as antibiotics.   °· You need IV nutrition for a long period.   °· You need frequent blood draws for lab tests.   °· You need dialysis.   °Implanted ports are usually placed in the chest area, but they can also be placed in the upper arm, the abdomen, or the leg. An implanted port has two main parts:  °· Reservoir. The reservoir is round and will appear as a small, raised area under your skin. The reservoir is the part where a needle is inserted to give medicines or draw blood.   °· Catheter. The catheter is a thin, flexible tube that extends from the reservoir. The catheter is placed into a large vein. Medicine that is inserted into the reservoir goes into   the catheter and then into the vein.   °HOW WILL I CARE FOR MY INCISION SITE? °Do not get the incision site wet. Bathe or shower as directed by your health care provider.  °HOW IS MY PORT ACCESSED? °Special steps must be taken to access the port:  °· Before the port is accessed, a numbing cream can be placed on the skin. This helps numb the skin over the port site.   °· Your health care provider uses a sterile technique to access the port. °· Your health care provider must put on a mask and sterile gloves. °· The skin over your port is cleaned carefully with an antiseptic and allowed to  dry. °· The port is gently pinched between sterile gloves, and a needle is inserted into the port. °· Only "non-coring" port needles should be used to access the port. Once the port is accessed, a blood return should be checked. This helps ensure that the port is in the vein and is not clogged.   °· If your port needs to remain accessed for a constant infusion, a clear (transparent) bandage will be placed over the needle site. The bandage and needle will need to be changed every week, or as directed by your health care provider.   °· Keep the bandage covering the needle clean and dry. Do not get it wet. Follow your health care provider's instructions on how to take a shower or bath while the port is accessed.   °· If your port does not need to stay accessed, no bandage is needed over the port.   °WHAT IS FLUSHING? °Flushing helps keep the port from getting clogged. Follow your health care provider's instructions on how and when to flush the port. Ports are usually flushed with saline solution or a medicine called heparin. The need for flushing will depend on how the port is used.  °· If the port is used for intermittent medicines or blood draws, the port will need to be flushed:   °· After medicines have been given.   °· After blood has been drawn.   °· As part of routine maintenance.   °· If a constant infusion is running, the port may not need to be flushed.   °HOW LONG WILL MY PORT STAY IMPLANTED? °The port can stay in for as long as your health care provider thinks it is needed. When it is time for the port to come out, surgery will be done to remove it. The procedure is similar to the one performed when the port was put in.  °WHEN SHOULD I SEEK IMMEDIATE MEDICAL CARE? °When you have an implanted port, you should seek immediate medical care if:  °· You notice a bad smell coming from the incision site.   °· You have swelling, redness, or drainage at the incision site.   °· You have more swelling or pain at the  port site or the surrounding area.   °· You have a fever that is not controlled with medicine. °Document Released: 08/06/2005 Document Revised: 05/27/2013 Document Reviewed: 04/13/2013 °ExitCare® Patient Information ©2015 [employer]. This information is not intended to replace advice given to you by your health care provider. Make sure you discuss any questions you have with your health care provider. °Conscious Sedation, Adult, Care After °Refer to this sheet in the next few weeks. These instructions provide you with information on caring for yourself after your procedure. Your health care provider may also give you more specific instructions. Your treatment has been planned according to current medical practices, but problems sometimes occur.   Call your health care provider if you have any problems or questions after your procedure. °WHAT TO EXPECT AFTER THE PROCEDURE  °After your procedure: °· You may feel sleepy, clumsy, and have poor balance for several hours. °· Vomiting may occur if you eat too soon after the procedure. °HOME CARE INSTRUCTIONS °· Do not participate in any activities where you could become injured for at least 24 hours. Do not: °¨ Drive. °¨ Swim. °¨ Ride a bicycle. °¨ Operate heavy machinery. °¨ Cook. °¨ Use power tools. °¨ Climb ladders. °¨ Work from a high place. °· Do not make important decisions or sign legal documents until you are improved. °· If you vomit, drink water, juice, or soup when you can drink without vomiting. Make sure you have little or no nausea before eating solid foods. °· Only take over-the-counter or prescription medicines for pain, discomfort, or fever as directed by your health care provider. °· Make sure you and your family fully understand everything about the medicines given to you, including what side effects may occur. °· You should not drink alcohol, take sleeping pills, or take medicines that cause drowsiness for at least 24 hours. °· If you smoke, do not  smoke without supervision. °· If you are feeling better, you may resume normal activities 24 hours after you were sedated. °· Keep all appointments with your health care provider. °SEEK MEDICAL CARE IF: °· Your skin is pale or bluish in color. °· You continue to feel nauseous or vomit. °· Your pain is getting worse and is not helped by medicine. °· You have bleeding or swelling. °· You are still sleepy or feeling clumsy after 24 hours. °SEEK IMMEDIATE MEDICAL CARE IF: °· You develop a rash. °· You have difficulty breathing. °· You develop any type of allergic problem. °· You have a fever. °MAKE SURE YOU: °· Understand these instructions. °· Will watch your condition. °· Will get help right away if you are not doing well or get worse. °Document Released: 05/27/2013 Document Reviewed: 05/27/2013 °Exit[employer]® Patient Information ©2015 [employer]. This information is not intended to replace advice given to you by your health care provider. Make sure you discuss any questions you have with your health care provider. ° ° ° °

## 2014-10-25 NOTE — Procedures (Signed)
Successful placement of right IJ approach port-a-cath with tip at the superior caval atrial junction. The catheter is ready for immediate use. No immediate post procedural complications.

## 2014-10-25 NOTE — Progress Notes (Signed)
Post procedure Report given to Carmell Austria, LPN at [facility] and to [place] and other RNs at [facility] center. Reported that  PIV remains in Left [facility] and for removal prior to discharge.

## 2014-10-26 ENCOUNTER — Telehealth: Payer: Self-pay | Admitting: *Deleted

## 2014-10-26 NOTE — Telephone Encounter (Signed)
-----   Message from Renford Dills, RN sent at 10/25/2014  2:13 PM EST ----- Regarding: chemo follow-up call 1st Alimta/Carbo  Dr. Julien Nordmann [phone]

## 2014-10-26 NOTE — Telephone Encounter (Signed)
Spoke with patient's nurse at [facility]. She states patient did very well, PAC site clean. No N/V or diarrhea. States he says he has lots of energy. Confirmed next appt 3/14 at 2:15pm with Marlynn Perking

## 2014-10-26 NOTE — Telephone Encounter (Signed)
I called [facility] to f/u day after chemo to see how pt doing. I spoke to Francisco Love " pt has a lot of energy , doing leg exercises, has antiemetic"

## 2014-11-01 ENCOUNTER — Ambulatory Visit (HOSPITAL_BASED_OUTPATIENT_CLINIC_OR_DEPARTMENT_OTHER): Payer: Self-pay | Admitting: Nurse Practitioner

## 2014-11-01 ENCOUNTER — Telehealth: Payer: Self-pay | Admitting: Nurse Practitioner

## 2014-11-01 ENCOUNTER — Ambulatory Visit (HOSPITAL_BASED_OUTPATIENT_CLINIC_OR_DEPARTMENT_OTHER): Payer: Self-pay

## 2014-11-01 ENCOUNTER — Encounter: Payer: Self-pay | Admitting: Nurse Practitioner

## 2014-11-01 VITALS — BP 97/64 | HR 96 | Resp 18 | Ht 69.0 in | Wt 137.9 lb

## 2014-11-01 DIAGNOSIS — C3411 Malignant neoplasm of upper lobe, right bronchus or lung: Secondary | ICD-10-CM

## 2014-11-01 DIAGNOSIS — C3491 Malignant neoplasm of unspecified part of right bronchus or lung: Secondary | ICD-10-CM

## 2014-11-01 DIAGNOSIS — R609 Edema, unspecified: Secondary | ICD-10-CM

## 2014-11-01 DIAGNOSIS — C7951 Secondary malignant neoplasm of bone: Secondary | ICD-10-CM

## 2014-11-01 LAB — COMPREHENSIVE METABOLIC PANEL (CC13)
ALT: 48 U/L (ref 0–55)
AST: 30 U/L (ref 5–34)
Albumin: 2 g/dL — ABNORMAL LOW (ref 3.5–5.0)
Alkaline Phosphatase: 548 U/L — ABNORMAL HIGH (ref 40–150)
Anion Gap: 9 meq/L (ref 3–11)
BUN: 19 mg/dL (ref 7.0–26.0)
CO2: 16 meq/L — ABNORMAL LOW (ref 22–29)
Calcium: 8.2 mg/dL — ABNORMAL LOW (ref 8.4–10.4)
Chloride: 109 meq/L (ref 98–109)
Creatinine: 0.9 mg/dL (ref 0.7–1.3)
EGFR: >90 mL/min/{1.73_m2}
Glucose: 80 mg/dL (ref 70–140)
Potassium: 5.1 meq/L (ref 3.5–5.1)
Sodium: 134 meq/L — ABNORMAL LOW (ref 136–145)
Total Bilirubin: <0.2 mg/dL (ref 0.20–1.20)
Total Protein: 6.5 g/dL (ref 6.4–8.3)

## 2014-11-01 LAB — CBC WITH DIFFERENTIAL/PLATELET
BASO%: 0.6 % (ref 0.0–2.0)
BASOS ABS: 0 10*3/uL (ref 0.0–0.1)
EOS%: 3 % (ref 0.0–7.0)
Eosinophils Absolute: 0.1 10*3/uL (ref 0.0–0.5)
HCT: 32.2 % — ABNORMAL LOW (ref 38.4–49.9)
HEMOGLOBIN: 9.9 g/dL — AB (ref 13.0–17.1)
LYMPH%: 55.1 % — AB (ref 14.0–49.0)
MCH: 25.9 pg — AB (ref 27.2–33.4)
MCHC: 30.9 g/dL — ABNORMAL LOW (ref 32.0–36.0)
MCV: 83.7 fL (ref 79.3–98.0)
MONO#: 0.1 10*3/uL (ref 0.1–0.9)
MONO%: 3.6 % (ref 0.0–14.0)
NEUT%: 37.7 % — AB (ref 39.0–75.0)
NEUTROS ABS: 1 10*3/uL — AB (ref 1.5–6.5)
PLATELETS: 259 10*3/uL (ref 140–400)
RBC: 3.84 10*6/uL — ABNORMAL LOW (ref 4.20–5.82)
RDW: 15.6 % — ABNORMAL HIGH (ref 11.0–14.6)
WBC: 2.7 10*3/uL — ABNORMAL LOW (ref 4.0–10.3)
lymph#: 1.5 10*3/uL (ref 0.9–3.3)

## 2014-11-01 NOTE — Progress Notes (Signed)
  Welcome OFFICE PROGRESS NOTE   Diagnosis:  Stage IV (T2b, N2, M1b) non-small cell lung cancer, adenocarcinoma with a large right hilar mass as well as mediastinal lymphadenopathy and metastatic disease to bone and possibly liver and pancreas   INTERVAL HISTORY:   Francisco Love returns as scheduled. He completed cycle 1 carboplatin/Alimta 10/25/2014. He feels he tolerated the first cycle extremely well. He denies nausea/vomiting. No mouth sores. No diarrhea or constipation. The leg swelling has improved. He reports having a venous Doppler done at the nursing facility and reported to him as negative for DVT. He denies shortness of breath at rest. He notes dyspnea on exertion when he participates in therapy. No cough or fever.  Objective:  Vital signs in last 24 hours:  Blood pressure 97/64, pulse 96, temperature 0 F (-17.8 C), temperature source Oral, resp. rate 18, height 5\' 9"  (1.753 m), weight 137 lb 14.4 oz (62.551 kg).    HEENT: No thrush or ulcers. Lymphatics: No palpable cervical, supraclavicular lymph nodes. Resp: Lungs clear bilaterally. No wheezes or rales. Cardio: Regular rate and rhythm. GI: Abdomen soft and nontender. No hepatomegaly. Vascular: Pitting lower leg edema bilaterally right slightly greater than left. Neuro: Alert and oriented.  Skin: No rash. Port-A-Cath without erythema.    Lab Results:  Lab Results  Component Value Date   WBC 14.8* 10/25/2014   HGB 10.9* 10/25/2014   HCT 34.2* 10/25/2014   MCV 83.2 10/25/2014   PLT 341 10/25/2014   NEUTROABS 6.7* 10/18/2014    Imaging:  No results found.  Medications: I have reviewed the patient's current medications.  Assessment/Plan: 1. Stage IV (T2b, N2, M1b) non-small cell lung cancer, adenocarcinoma with a large right hilar mass as well as mediastinal lymphadenopathy and metastatic disease to bone and possibly liver and pancreas.   MRI brain 10/15/2014 negative for metastatic disease.    PET scan 10/15/2014 with markedly hypermetabolic right paratracheal lesion associated with hypermetabolic metastases in the right hilum and scattered sclerotic hypermetabolic bone metastases. Cystic lesion in the pancreatic head not hypermetabolic. No definite hypermetabolic metastatic lesion in the liver.  Cycle 1 carboplatin/Alimta 10/25/2014 2. Bilateral lower extremity edema right greater than left. He reports venous Doppler was done at the nursing facility. 3. Port-A-Cath placement 10/25/2014   Disposition: Francisco Love appears stable. He has completed 1 cycle of carboplatin/Alimta. He seems to have tolerated the first cycle well. He is scheduled to return for cycle 2 on 11/15/2014. We will continue weekly labs in the interim.  We will contact the nursing facility for the venous Doppler report.  Plan reviewed with Dr. Julien Nordmann.  Ned Card ANP/GNP-BC   11/01/2014  3:22 PM

## 2014-11-01 NOTE — Telephone Encounter (Signed)
Gave avs & calendar for March. Sent patient to labs.

## 2014-11-02 ENCOUNTER — Encounter (HOSPITAL_BASED_OUTPATIENT_CLINIC_OR_DEPARTMENT_OTHER): Payer: Medicare Other | Attending: General Surgery

## 2014-11-02 DIAGNOSIS — S3125XA Open bite of penis, initial encounter: Secondary | ICD-10-CM | POA: Insufficient documentation

## 2014-11-02 DIAGNOSIS — Z87891 Personal history of nicotine dependence: Secondary | ICD-10-CM | POA: Insufficient documentation

## 2014-11-02 DIAGNOSIS — C3490 Malignant neoplasm of unspecified part of unspecified bronchus or lung: Secondary | ICD-10-CM | POA: Insufficient documentation

## 2014-11-02 DIAGNOSIS — W503XXA Accidental bite by another person, initial encounter: Secondary | ICD-10-CM | POA: Insufficient documentation

## 2014-11-04 LAB — AFB CULTURE WITH SMEAR (NOT AT ARMC): Acid Fast Smear: NONE SEEN

## 2014-11-15 ENCOUNTER — Other Ambulatory Visit (HOSPITAL_BASED_OUTPATIENT_CLINIC_OR_DEPARTMENT_OTHER): Payer: Medicaid Other

## 2014-11-15 ENCOUNTER — Ambulatory Visit (HOSPITAL_BASED_OUTPATIENT_CLINIC_OR_DEPARTMENT_OTHER): Payer: Medicaid Other | Admitting: Internal Medicine

## 2014-11-15 ENCOUNTER — Telehealth: Payer: Self-pay | Admitting: Internal Medicine

## 2014-11-15 ENCOUNTER — Encounter: Payer: Self-pay | Admitting: Internal Medicine

## 2014-11-15 ENCOUNTER — Telehealth: Payer: Self-pay | Admitting: *Deleted

## 2014-11-15 ENCOUNTER — Ambulatory Visit (HOSPITAL_BASED_OUTPATIENT_CLINIC_OR_DEPARTMENT_OTHER): Payer: Medicaid Other

## 2014-11-15 VITALS — BP 92/59 | HR 102 | Temp 98.0°F | Resp 18 | Ht 69.0 in | Wt 141.6 lb

## 2014-11-15 DIAGNOSIS — C3490 Malignant neoplasm of unspecified part of unspecified bronchus or lung: Secondary | ICD-10-CM

## 2014-11-15 DIAGNOSIS — C7951 Secondary malignant neoplasm of bone: Secondary | ICD-10-CM | POA: Diagnosis not present

## 2014-11-15 DIAGNOSIS — R0602 Shortness of breath: Secondary | ICD-10-CM

## 2014-11-15 DIAGNOSIS — C801 Malignant (primary) neoplasm, unspecified: Principal | ICD-10-CM

## 2014-11-15 DIAGNOSIS — Z5111 Encounter for antineoplastic chemotherapy: Secondary | ICD-10-CM

## 2014-11-15 DIAGNOSIS — C3491 Malignant neoplasm of unspecified part of right bronchus or lung: Secondary | ICD-10-CM

## 2014-11-15 LAB — COMPREHENSIVE METABOLIC PANEL (CC13)
ALT: 37 U/L (ref 0–55)
AST: 25 U/L (ref 5–34)
Albumin: 2 g/dL — ABNORMAL LOW (ref 3.5–5.0)
Alkaline Phosphatase: 625 U/L — ABNORMAL HIGH (ref 40–150)
Anion Gap: 8 mEq/L (ref 3–11)
BUN: 21.5 mg/dL (ref 7.0–26.0)
CALCIUM: 8.2 mg/dL — AB (ref 8.4–10.4)
CO2: 16 mEq/L — ABNORMAL LOW (ref 22–29)
CREATININE: 1 mg/dL (ref 0.7–1.3)
Chloride: 113 mEq/L — ABNORMAL HIGH (ref 98–109)
GLUCOSE: 88 mg/dL (ref 70–140)
POTASSIUM: 4.9 meq/L (ref 3.5–5.1)
SODIUM: 138 meq/L (ref 136–145)
TOTAL PROTEIN: 6.8 g/dL (ref 6.4–8.3)

## 2014-11-15 LAB — CBC WITH DIFFERENTIAL/PLATELET
BASO%: 0.4 % (ref 0.0–2.0)
Basophils Absolute: 0.1 10*3/uL (ref 0.0–0.1)
EOS%: 1.6 % (ref 0.0–7.0)
Eosinophils Absolute: 0.2 10*3/uL (ref 0.0–0.5)
HCT: 29.6 % — ABNORMAL LOW (ref 38.4–49.9)
HGB: 9.5 g/dL — ABNORMAL LOW (ref 13.0–17.1)
LYMPH%: 28.3 % (ref 14.0–49.0)
MCH: 26.7 pg — ABNORMAL LOW (ref 27.2–33.4)
MCHC: 32.1 g/dL (ref 32.0–36.0)
MCV: 83.1 fL (ref 79.3–98.0)
MONO#: 2.5 10*3/uL — ABNORMAL HIGH (ref 0.1–0.9)
MONO%: 19.7 % — ABNORMAL HIGH (ref 0.0–14.0)
NEUT#: 6.4 10*3/uL (ref 1.5–6.5)
NEUT%: 50 % (ref 39.0–75.0)
Platelets: 793 10*3/uL — ABNORMAL HIGH (ref 140–400)
RBC: 3.56 10*6/uL — AB (ref 4.20–5.82)
RDW: 16.1 % — ABNORMAL HIGH (ref 11.0–14.6)
WBC: 12.7 10*3/uL — AB (ref 4.0–10.3)
lymph#: 3.6 10*3/uL — ABNORMAL HIGH (ref 0.9–3.3)

## 2014-11-15 LAB — TECHNOLOGIST REVIEW

## 2014-11-15 MED ORDER — SODIUM CHLORIDE 0.9 % IV SOLN
500.0000 mg/m2 | Freq: Once | INTRAVENOUS | Status: AC
  Administered 2014-11-15: 900 mg via INTRAVENOUS
  Filled 2014-11-15: qty 36

## 2014-11-15 MED ORDER — SODIUM CHLORIDE 0.9 % IV SOLN
Freq: Once | INTRAVENOUS | Status: AC
  Administered 2014-11-15: 15:00:00 via INTRAVENOUS

## 2014-11-15 MED ORDER — SODIUM CHLORIDE 0.9 % IV SOLN
550.0000 mg | Freq: Once | INTRAVENOUS | Status: AC
  Administered 2014-11-15: 550 mg via INTRAVENOUS
  Filled 2014-11-15: qty 55

## 2014-11-15 MED ORDER — SODIUM CHLORIDE 0.9 % IV SOLN
Freq: Once | INTRAVENOUS | Status: AC
  Administered 2014-11-15: 15:00:00 via INTRAVENOUS
  Filled 2014-11-15: qty 8

## 2014-11-15 MED ORDER — HEPARIN SOD (PORK) LOCK FLUSH 100 UNIT/ML IV SOLN
500.0000 [IU] | Freq: Once | INTRAVENOUS | Status: AC | PRN
  Administered 2014-11-15: 500 [IU]
  Filled 2014-11-15: qty 5

## 2014-11-15 MED ORDER — SODIUM CHLORIDE 0.9 % IJ SOLN
10.0000 mL | INTRAMUSCULAR | Status: DC | PRN
  Administered 2014-11-15: 10 mL
  Filled 2014-11-15: qty 10

## 2014-11-15 NOTE — Telephone Encounter (Signed)
Per staff message and POF I have scheduled appts. Advised scheduler of appts. JMW  

## 2014-11-15 NOTE — Patient Instructions (Signed)
[place] Discharge Instructions for Patients Receiving Chemotherapy  Today you received the following chemotherapy agents alimta/carboplatin  To help prevent nausea and vomiting after your treatment, we encourage you to take your nausea medication as directed   If you develop nausea and vomiting that is not controlled by your nausea medication, call the clinic.   BELOW ARE SYMPTOMS THAT SHOULD BE REPORTED IMMEDIATELY:  *FEVER GREATER THAN 100.5 F  *CHILLS WITH OR WITHOUT FEVER  NAUSEA AND VOMITING THAT IS NOT CONTROLLED WITH YOUR NAUSEA MEDICATION  *UNUSUAL SHORTNESS OF BREATH  *UNUSUAL BRUISING OR BLEEDING  TENDERNESS IN MOUTH AND THROAT WITH OR WITHOUT PRESENCE OF ULCERS  *URINARY PROBLEMS  *BOWEL PROBLEMS  UNUSUAL RASH Items with * indicate a potential emergency and should be followed up as soon as possible.  Feel free to call the clinic you have any questions or concerns. The clinic phone number is [phone].

## 2014-11-15 NOTE — Progress Notes (Signed)
[place] [phone]   [phone]  OFFICE PROGRESS NOTE  [name], [name], MD [place] [address]  DIAGNOSIS: Stage IV (T2b, N2, M1b) non-small cell lung cancer, adenocarcinoma with a large right hilar mass as well as mediastinal lymphadenopathy and metastatic disease to the liver, bone and pancreas diagnosed in February 2016.  PRIOR THERAPY: None  CURRENT THERAPY: Systemic chemotherapy with carboplatin for AUC of 5 and Alimta 500 MG/M2 every 3 weeks. Status post one cycle.  INTERVAL HISTORY: Francisco Love 54 y.o. male returns to the clinic today for follow-up visit. The patient tolerated the first cycle of her systemic chemotherapy with carboplatin and Alimta fairly well with no significant adverse effects. He denied having any significant fever or chills, no nausea or vomiting. The patient denied having any significant chest pain but continues to have shortness of breath which is improved compared to before starting the chemotherapy. He denied having any significant cough or hemoptysis. He is currently a resident at the [facility]. He states today to start the second cycle of his systemic chemotherapy.  MEDICAL HISTORY: Past Medical History  Diagnosis Date  . Shortness of breath dyspnea     ALLERGIES:  has No Known Allergies.  MEDICATIONS:  Current Outpatient Prescriptions  Medication Sig Dispense Refill  . acetaminophen (TYLENOL) 325 MG tablet Take 2 tablets (650 mg total) by mouth every 6 (six) hours as needed for mild pain (or Fever >/= 101).    Marland Kitchen albuterol (PROVENTIL) (2.5 MG/3ML) 0.083% nebulizer solution Take 3 mLs (2.5 mg total) by nebulization every 2 (two) hours as needed for wheezing. 75 mL 12  . bisacodyl (DULCOLAX) 10 MG suppository Place 10 mg rectally daily as needed for moderate constipation.    Marland Kitchen dexamethasone (DECADRON) 4 MG tablet Take 1 tablet (4 mg total) by mouth  2 (two) times daily with a meal. 30 tablet 0  . diphenhydrAMINE (SOMINEX) 25 MG tablet Take 25 mg by mouth as needed for itching or sleep.    . feeding supplement, ENSURE COMPLETE, (ENSURE COMPLETE) LIQD Take 237 mLs by mouth 3 (three) times daily between meals.    . folic acid (FOLVITE) 1 MG tablet Take 1 tablet (1 mg total) by mouth daily. 30 tablet 0  . guaiFENesin-dextromethorphan (ROBITUSSIN DM) 100-10 MG/5ML syrup Take 5 mLs by mouth every 4 (four) hours as needed for cough. 118 mL 0  . Magnesium Hydroxide (MILK OF MAGNESIA PO) Take 30 mLs by mouth every 6 (six) hours as needed (constipation).     . mirtazapine (REMERON) 15 MG tablet Take 1 tablet (15 mg total) by mouth at bedtime.    Marland Kitchen oxyCODONE (OXY IR/ROXICODONE) 5 MG immediate release tablet Take 1 tablet (5 mg total) by mouth every 4 (four) hours as needed for moderate pain. 20 tablet 0  . prochlorperazine (COMPAZINE) 10 MG tablet Take 1 tablet (10 mg total) by mouth every 6 (six) hours as needed for nausea or vomiting. 30 tablet 1  . senna-docusate (SENOKOT-S) 8.6-50 MG per tablet Take 1 tablet by mouth daily.     No current facility-administered medications for this visit.    SURGICAL HISTORY:  Past Surgical History  Procedure Laterality Date  . Appendectomy    . Video bronchoscopy N/A 09/21/2014    Procedure: VIDEO BRONCHOSCOPY WITH FLUORO;  Surgeon: Rigoberto Noel, MD;  Location: [facility];  Service: Cardiopulmonary;  Laterality: N/A;  . Video bronchoscopy  with endobronchial ultrasound N/A 09/23/2014    Procedure: VIDEO BRONCHOSCOPY WITH ENDOBRONCHIAL ULTRASOUND;  Surgeon: Rigoberto Noel, MD;  Location: [facility];  Service: Pulmonary;  Laterality: N/A;    REVIEW OF SYSTEMS:  A comprehensive review of systems was negative except for: Constitutional: positive for fatigue Respiratory: positive for dyspnea on exertion   PHYSICAL EXAMINATION: General appearance: alert, cooperative, fatigued and no distress Head: Normocephalic, without  obvious abnormality, atraumatic Neck: no adenopathy, no JVD, supple, symmetrical, trachea midline and thyroid not enlarged, symmetric, no tenderness/mass/nodules Lymph nodes: Cervical, supraclavicular, and axillary nodes normal. Resp: clear to auscultation bilaterally Back: symmetric, no curvature. ROM normal. No CVA tenderness. Cardio: regular rate and rhythm, S1, S2 normal, no murmur, click, rub or gallop GI: soft, non-tender; bowel sounds normal; no masses,  no organomegaly Extremities: extremities normal, atraumatic, no cyanosis or edema  ECOG PERFORMANCE STATUS: 2 - Symptomatic, <50% confined to bed  There were no vitals taken for this visit.  LABORATORY DATA: Lab Results  Component Value Date   WBC 12.7* 11/15/2014   HGB 9.5* 11/15/2014   HCT 29.6* 11/15/2014   MCV 83.1 11/15/2014   PLT 793* 11/15/2014      Chemistry      Component Value Date/Time   NA 134* 11/01/2014 1613   NA 135 10/25/2014 0845   K 5.1 11/01/2014 1613   K 5.3* 10/25/2014 0845   CL 108 10/25/2014 0845   CO2 16* 11/01/2014 1613   CO2 19 10/25/2014 0845   BUN 19.0 11/01/2014 1613   BUN 22 10/25/2014 0845   CREATININE 0.9 11/01/2014 1613   CREATININE 1.03 10/25/2014 0845      Component Value Date/Time   CALCIUM 8.2* 11/01/2014 1613   CALCIUM 8.6 10/25/2014 0845   ALKPHOS 548* 11/01/2014 1613   ALKPHOS 882* 09/20/2014 1120   AST 30 11/01/2014 1613   AST 29 09/20/2014 1120   ALT 48 11/01/2014 1613   ALT 34 09/20/2014 1120   BILITOT <0.20 11/01/2014 1613   BILITOT 0.1* 09/20/2014 1120       RADIOGRAPHIC STUDIES: Ir Fluoro Guide Cv Line Right  10/25/2014   INDICATION: History of metastatic non-small-cell lung cancer. In need of durable intravenous access for chemotherapy administration.  EXAM: IMPLANTED PORT A CATH PLACEMENT WITH ULTRASOUND AND FLUOROSCOPIC GUIDANCE  COMPARISON:  PET-CT - 10/15/2014  MEDICATIONS: Ancef 2 gm IV; The antibiotic was administered within an appropriate time interval  prior to skin puncture.  ANESTHESIA/SEDATION: Versed 4 mg IV; Fentanyl 100 mcg IV;  Total Moderate Sedation Time  24  minutes.  CONTRAST:  None  FLUOROSCOPY TIME:  24 seconds (4 mGy).  COMPLICATIONS: None immediate  PROCEDURE: The procedure, risks, benefits, and alternatives were explained to the patient. Questions regarding the procedure were encouraged and answered. The patient understands and consents to the procedure.  The right neck and chest were prepped with chlorhexidine in a sterile fashion, and a sterile drape was applied covering the operative field. Maximum barrier sterile technique with sterile gowns and gloves were used for the procedure. A timeout was performed prior to the initiation of the procedure. Local anesthesia was provided with 1% lidocaine with epinephrine.  After creating a small venotomy incision, a micropuncture kit was utilized to access the internal jugular vein under direct, real-time ultrasound guidance. Ultrasound image documentation was performed. The microwire was kinked to measure appropriate catheter length.  A subcutaneous port pocket was then created along the upper chest wall utilizing a combination of sharp and blunt dissection.  The pocket was irrigated with sterile saline. A single lumen thin power injectable port was chosen for placement. The 8 Fr catheter was tunneled from the port pocket site to the venotomy incision. The port was placed in the pocket. The external catheter was trimmed to appropriate length. At the venotomy, an 8 Fr peel-away sheath was placed over a guidewire under fluoroscopic guidance. The catheter was then placed through the sheath and the sheath was removed. Final catheter positioning was confirmed and documented with a fluoroscopic spot radiograph. The port was accessed with a Huber needle, aspirated and flushed with heparinized saline.  The venotomy site was closed with an interrupted 4-0 Vicryl suture. The port pocket incision was closed with  interrupted 2-0 Vicryl suture and the skin was opposed with a running subcuticular 4-0 Vicryl suture. Dermabond and Steri-strips were applied to both incisions. Dressings were placed. The patient tolerated the procedure well without immediate post procedural complication.  FINDINGS: After catheter placement, the tip lies within the superior cavoatrial junction. The catheter aspirates and flushes normally and is ready for immediate use.  IMPRESSION: Successful placement of a right internal jugular approach power injectable Port-A-Cath. The catheter is ready for immediate use.   Electronically Signed   By: Sandi Mariscal M.D.   On: 10/25/2014 11:24   Ir US Guide Vasc Access Right  10/25/2014   INDICATION: History of metastatic non-small-cell lung cancer. In need of durable intravenous access for chemotherapy administration.  EXAM: IMPLANTED PORT A CATH PLACEMENT WITH ULTRASOUND AND FLUOROSCOPIC GUIDANCE  COMPARISON:  PET-CT - 10/15/2014  MEDICATIONS: Ancef 2 gm IV; The antibiotic was administered within an appropriate time interval prior to skin puncture.  ANESTHESIA/SEDATION: Versed 4 mg IV; Fentanyl 100 mcg IV;  Total Moderate Sedation Time  24  minutes.  CONTRAST:  None  FLUOROSCOPY TIME:  24 seconds (4 mGy).  COMPLICATIONS: None immediate  PROCEDURE: The procedure, risks, benefits, and alternatives were explained to the patient. Questions regarding the procedure were encouraged and answered. The patient understands and consents to the procedure.  The right neck and chest were prepped with chlorhexidine in a sterile fashion, and a sterile drape was applied covering the operative field. Maximum barrier sterile technique with sterile gowns and gloves were used for the procedure. A timeout was performed prior to the initiation of the procedure. Local anesthesia was provided with 1% lidocaine with epinephrine.  After creating a small venotomy incision, a micropuncture kit was utilized to access the internal jugular vein  under direct, real-time ultrasound guidance. Ultrasound image documentation was performed. The microwire was kinked to measure appropriate catheter length.  A subcutaneous port pocket was then created along the upper chest wall utilizing a combination of sharp and blunt dissection. The pocket was irrigated with sterile saline. A single lumen thin power injectable port was chosen for placement. The 8 Fr catheter was tunneled from the port pocket site to the venotomy incision. The port was placed in the pocket. The external catheter was trimmed to appropriate length. At the venotomy, an 8 Fr peel-away sheath was placed over a guidewire under fluoroscopic guidance. The catheter was then placed through the sheath and the sheath was removed. Final catheter positioning was confirmed and documented with a fluoroscopic spot radiograph. The port was accessed with a Huber needle, aspirated and flushed with heparinized saline.  The venotomy site was closed with an interrupted 4-0 Vicryl suture. The port pocket incision was closed with interrupted 2-0 Vicryl suture and the skin was opposed with  a running subcuticular 4-0 Vicryl suture. Dermabond and Steri-strips were applied to both incisions. Dressings were placed. The patient tolerated the procedure well without immediate post procedural complication.  FINDINGS: After catheter placement, the tip lies within the superior cavoatrial junction. The catheter aspirates and flushes normally and is ready for immediate use.  IMPRESSION: Successful placement of a right internal jugular approach power injectable Port-A-Cath. The catheter is ready for immediate use.   Electronically Signed   By: Sandi Mariscal M.D.   On: 10/25/2014 11:24    ASSESSMENT AND PLAN: This is a very pleasant 54 years old African-American male recently diagnosed with a stage IV non-small cell lung cancer, adenocarcinoma. He is currently undergoing systemic chemotherapy was carboplatin and Alimta status post 1  cycle and tolerating his treatment fairly well. The molecular study by [employer] are still pending.  I recommended for the patient to proceed with cycle #2 of his chemotherapy today as a scheduled. He would come back for follow-up visit in 3 weeks with the next cycle of his treatment.   He was advised to call immediately if he has any concerning symptoms in the interval. The patient voices understanding of current disease status and treatment options and is in agreement with the current care plan.  All questions were answered. The patient knows to call the clinic with any problems, questions or concerns. We can certainly see the patient much sooner if necessary.   Disclaimer: This note was dictated with voice recognition software. Similar sounding words can inadvertently be transcribed and may not be corrected upon review.

## 2014-11-15 NOTE — Telephone Encounter (Signed)
Gave avs & calendar for April/May., Sent message to schedule treatment

## 2014-11-18 ENCOUNTER — Encounter: Payer: Self-pay | Admitting: Internal Medicine

## 2014-11-18 ENCOUNTER — Non-Acute Institutional Stay (SKILLED_NURSING_FACILITY): Payer: Medicaid Other | Admitting: Internal Medicine

## 2014-11-18 DIAGNOSIS — C3491 Malignant neoplasm of unspecified part of right bronchus or lung: Secondary | ICD-10-CM

## 2014-11-18 DIAGNOSIS — J9601 Acute respiratory failure with hypoxia: Secondary | ICD-10-CM | POA: Diagnosis not present

## 2014-11-18 DIAGNOSIS — N4889 Other specified disorders of penis: Secondary | ICD-10-CM

## 2014-11-18 DIAGNOSIS — Z720 Tobacco use: Secondary | ICD-10-CM | POA: Diagnosis not present

## 2014-11-18 DIAGNOSIS — E46 Unspecified protein-calorie malnutrition: Secondary | ICD-10-CM

## 2014-11-18 DIAGNOSIS — C801 Malignant (primary) neoplasm, unspecified: Secondary | ICD-10-CM

## 2014-11-18 DIAGNOSIS — N489 Disorder of penis, unspecified: Secondary | ICD-10-CM

## 2014-11-18 DIAGNOSIS — C7951 Secondary malignant neoplasm of bone: Secondary | ICD-10-CM | POA: Diagnosis not present

## 2014-11-18 NOTE — Progress Notes (Addendum)
MRN: [id number] Name: Francisco Love  Sex: male Age: 54 y.o. DOB: December 18, 1960  [place] #: [facility] Facility/Room:220 Level Of Care: SNF Provider: Inocencio Homes D Emergency Contacts: Extended Emergency Contact Information Primary Emergency Contact: Raynor,Nancy Address: [address]          [place], [place] [address] [place] Phone: [phone] Mobile Phone: [phone] Relation: Sister Secondary Emergency Contact: Duan,Brenda Address: [address]          [place], [place] [address] [place] of [place] Mobile Phone: [phone] Relation: Sister  Code Status: FULL  Allergies: Review of patient's allergies indicates no known allergies.  Chief Complaint  Patient presents with  . Discharge Note    HPI: Patient is 54 y.o. male who was admitted to SNF for generalized weakness with dx stage 4 non-small cell CA who is now ready to go home.  Past Medical History  Diagnosis Date  . Shortness of breath dyspnea     Past Surgical History  Procedure Laterality Date  . Appendectomy    . Video bronchoscopy N/A 09/21/2014    Procedure: VIDEO BRONCHOSCOPY WITH FLUORO;  Surgeon: Rigoberto Noel, MD;  Location: [facility];  Service: Cardiopulmonary;  Laterality: N/A;  . Video bronchoscopy with endobronchial ultrasound N/A 09/23/2014    Procedure: VIDEO BRONCHOSCOPY WITH ENDOBRONCHIAL ULTRASOUND;  Surgeon: Rigoberto Noel, MD;  Location: [facility];  Service: Pulmonary;  Laterality: N/A;      Medication List       This list is accurate as of: 11/18/14  3:00 PM.  Always use your most recent med list.               acetaminophen 325 MG tablet  Commonly known as:  TYLENOL  Take 2 tablets (650 mg total) by mouth every 6 (six) hours as needed for mild pain (or Fever >/= 101).     albuterol (2.5 MG/3ML) 0.083% nebulizer solution  Commonly known as:  PROVENTIL  Take 3 mLs (2.5 mg total) by nebulization every 2 (two) hours as needed for wheezing.     dexamethasone 4  MG tablet  Commonly known as:  DECADRON  Take 1 tablet (4 mg total) by mouth 2 (two) times daily with a meal.     diphenhydrAMINE 25 MG tablet  Commonly known as:  SOMINEX  Take 25 mg by mouth as needed for itching or sleep.     feeding supplement (ENSURE COMPLETE) Liqd  Take 237 mLs by mouth 3 (three) times daily between meals.     folic acid 1 MG tablet  Commonly known as:  FOLVITE  Take 1 tablet (1 mg total) by mouth daily.     guaiFENesin-dextromethorphan 100-10 MG/5ML syrup  Commonly known as:  ROBITUSSIN DM  Take 5 mLs by mouth every 4 (four) hours as needed for cough.     mirtazapine 15 MG tablet  Commonly known as:  REMERON  Take 1 tablet (15 mg total) by mouth at bedtime.     oxyCODONE 5 MG immediate release tablet  Commonly known as:  Oxy IR/ROXICODONE  Take 1 tablet (5 mg total) by mouth every 4 (four) hours as needed for moderate pain.     prochlorperazine 10 MG tablet  Commonly known as:  COMPAZINE  Take 1 tablet (10 mg total) by mouth every 6 (six) hours as needed for nausea or vomiting.     senna-docusate 8.6-50 MG per tablet  Commonly known as:  Senokot-S  Take 1 tablet by mouth daily.  No orders of the defined types were placed in this encounter.     There is no immunization history on file for this patient.  History  Substance Use Topics  . Smoking status: Former Smoker -- 1.00 packs/day    Types: Cigarettes    Quit date: 06/20/2014  . Smokeless tobacco: Not on file  . Alcohol Use: No    There were no vitals filed for this visit.  Physical Exam  GENERAL APPEARANCE: Alert, conversant. No acute distress.  HEENT: Unremarkable. RESPIRATORY: Breathing is even, unlabored. Lung sounds are clear   CARDIOVASCULAR: Heart RRR no murmurs, rubs or gallops. No peripheral edema.  GASTROINTESTINAL: Abdomen is soft, non-tender, not distended w/ normal bowel sounds.  NEUROLOGIC: Cranial nerves 2-12 grossly intact. Moves all extremities  Patient  Active Problem List   Diagnosis Date Noted  . Non-small cell cancer of right lung 10/06/2014  . Penile lesion 09/28/2014  . Tobacco abuse 09/28/2014  . Protein-calorie malnutrition 09/28/2014  . Debility 09/23/2014  . Respiratory failure, acute 09/23/2014  . Mediastinal lymphadenopathy   . 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 Lung mass 09/18/2014  . Shortness of breath 09/18/2014    CBC    Component Value Date/Time   WBC 12.7* 11/15/2014 1408   WBC 14.8* 10/25/2014 0845   RBC 3.56* 11/15/2014 1408   RBC 4.11* 10/25/2014 0845   HGB 9.5* 11/15/2014 1408   HGB 10.9* 10/25/2014 0845   HCT 29.6* 11/15/2014 1408   HCT 34.2* 10/25/2014 0845   PLT 793* 11/15/2014 1408   PLT 341 10/25/2014 0845   MCV 83.1 11/15/2014 1408   MCV 83.2 10/25/2014 0845   LYMPHSABS 3.6* 11/15/2014 1408   MONOABS 2.5* 11/15/2014 1408   EOSABS 0.2 11/15/2014 1408   BASOSABS 0.1 11/15/2014 1408    CMP     Component Value Date/Time   NA 138 11/15/2014 1408   NA 135 10/25/2014 0845   K 4.9 11/15/2014 1408   K 5.3* 10/25/2014 0845   CL 108 10/25/2014 0845   CO2 16* 11/15/2014 1408   CO2 19 10/25/2014 0845   GLUCOSE 88 11/15/2014 1408   GLUCOSE 99 10/25/2014 0845   BUN 21.5 11/15/2014 1408   BUN 22 10/25/2014 0845   CREATININE 1.0 11/15/2014 1408   CREATININE 1.03 10/25/2014 0845   CALCIUM 8.2* 11/15/2014 1408   CALCIUM 8.6 10/25/2014 0845   PROT 6.8 11/15/2014 1408   PROT 6.5 09/20/2014 1120   ALBUMIN 2.0* 11/15/2014 1408   ALBUMIN 2.3* 09/20/2014 1120   AST 25 11/15/2014 1408   AST 29 09/20/2014 1120   ALT 37 11/15/2014 1408   ALT 34 09/20/2014 1120   ALKPHOS 625* 11/15/2014 1408   ALKPHOS 882* 09/20/2014 1120   BILITOT <0.20 11/15/2014 1408   BILITOT 0.1* 09/20/2014 1120   GFRNONAA 80* 10/25/2014 0845   GFRAA >90 10/25/2014 0845    Assessment and Plan  Pt is stable to d/c to home with HH/OT/PT.He will need hone O2, 2L [place],continuous. He has  a penile lesion that needs outpt f/u with a dermatologist.  Hennie Duos, MD

## 2014-11-22 ENCOUNTER — Other Ambulatory Visit: Payer: Medicare Other

## 2014-11-23 ENCOUNTER — Telehealth: Payer: Self-pay | Admitting: *Deleted

## 2014-11-23 NOTE — Telephone Encounter (Signed)
He will need prescription for blood work when he comes for the next visit.

## 2014-11-23 NOTE — Telephone Encounter (Signed)
Patient would like his labs done in [place] because it closer to home. Patient currently gets labs done weekly. Message sent to MD and RN

## 2014-11-24 ENCOUNTER — Telehealth: Payer: Self-pay | Admitting: *Deleted

## 2014-11-24 LAB — GUARDANT 360

## 2014-11-24 NOTE — Telephone Encounter (Signed)
Per MD, notified pt he can pick up a rx for labs to be done in [place] at his next visit. Pt verbalized understanding. Pt was discharged from [facility] on 4/4

## 2014-11-29 ENCOUNTER — Telehealth: Payer: Self-pay | Admitting: Medical Oncology

## 2014-11-29 ENCOUNTER — Other Ambulatory Visit: Payer: Medicare Other

## 2014-11-29 NOTE — Telephone Encounter (Signed)
Pt notified [facility] and schedule other weekly labs.

## 2014-12-06 ENCOUNTER — Encounter: Payer: Self-pay | Admitting: Physician Assistant

## 2014-12-06 ENCOUNTER — Other Ambulatory Visit (HOSPITAL_BASED_OUTPATIENT_CLINIC_OR_DEPARTMENT_OTHER): Payer: Medicaid Other

## 2014-12-06 ENCOUNTER — Ambulatory Visit (HOSPITAL_BASED_OUTPATIENT_CLINIC_OR_DEPARTMENT_OTHER): Payer: Medicaid Other | Admitting: Physician Assistant

## 2014-12-06 ENCOUNTER — Ambulatory Visit (HOSPITAL_BASED_OUTPATIENT_CLINIC_OR_DEPARTMENT_OTHER): Payer: Medicaid Other

## 2014-12-06 ENCOUNTER — Telehealth: Payer: Self-pay | Admitting: Internal Medicine

## 2014-12-06 VITALS — BP 99/69 | HR 65 | Temp 97.7°F | Resp 18 | Ht 69.0 in | Wt 142.6 lb

## 2014-12-06 DIAGNOSIS — C7889 Secondary malignant neoplasm of other digestive organs: Secondary | ICD-10-CM | POA: Diagnosis not present

## 2014-12-06 DIAGNOSIS — C7951 Secondary malignant neoplasm of bone: Secondary | ICD-10-CM

## 2014-12-06 DIAGNOSIS — C3491 Malignant neoplasm of unspecified part of right bronchus or lung: Secondary | ICD-10-CM

## 2014-12-06 DIAGNOSIS — C787 Secondary malignant neoplasm of liver and intrahepatic bile duct: Secondary | ICD-10-CM | POA: Diagnosis not present

## 2014-12-06 DIAGNOSIS — Z5111 Encounter for antineoplastic chemotherapy: Secondary | ICD-10-CM

## 2014-12-06 DIAGNOSIS — C801 Malignant (primary) neoplasm, unspecified: Principal | ICD-10-CM

## 2014-12-06 LAB — CBC WITH DIFFERENTIAL/PLATELET
BASO%: 0.3 % (ref 0.0–2.0)
Basophils Absolute: 0 10*3/uL (ref 0.0–0.1)
EOS ABS: 0.2 10*3/uL (ref 0.0–0.5)
EOS%: 1.7 % (ref 0.0–7.0)
HCT: 31.1 % — ABNORMAL LOW (ref 38.4–49.9)
HGB: 9.9 g/dL — ABNORMAL LOW (ref 13.0–17.1)
LYMPH#: 1.9 10*3/uL (ref 0.9–3.3)
LYMPH%: 15.2 % (ref 14.0–49.0)
MCH: 26.5 pg — ABNORMAL LOW (ref 27.2–33.4)
MCHC: 31.8 g/dL — ABNORMAL LOW (ref 32.0–36.0)
MCV: 83.3 fL (ref 79.3–98.0)
MONO#: 1.1 10*3/uL — ABNORMAL HIGH (ref 0.1–0.9)
MONO%: 9 % (ref 0.0–14.0)
NEUT#: 9.1 10*3/uL — ABNORMAL HIGH (ref 1.5–6.5)
NEUT%: 73.8 % (ref 39.0–75.0)
Platelets: 567 10*3/uL — ABNORMAL HIGH (ref 140–400)
RBC: 3.73 10*6/uL — AB (ref 4.20–5.82)
RDW: 16.9 % — AB (ref 11.0–14.6)
WBC: 12.3 10*3/uL — ABNORMAL HIGH (ref 4.0–10.3)

## 2014-12-06 LAB — COMPREHENSIVE METABOLIC PANEL (CC13)
ALK PHOS: 613 U/L — AB (ref 40–150)
ALT: 34 U/L (ref 0–55)
ANION GAP: 14 meq/L — AB (ref 3–11)
AST: 29 U/L (ref 5–34)
Albumin: 2.6 g/dL — ABNORMAL LOW (ref 3.5–5.0)
BUN: 18.3 mg/dL (ref 7.0–26.0)
CO2: 16 meq/L — AB (ref 22–29)
Calcium: 8.7 mg/dL (ref 8.4–10.4)
Chloride: 110 mEq/L — ABNORMAL HIGH (ref 98–109)
Creatinine: 1.1 mg/dL (ref 0.7–1.3)
EGFR: 84 mL/min/{1.73_m2} — AB (ref >90)
GLUCOSE: 90 mg/dL (ref 70–140)
POTASSIUM: 5.6 meq/L — AB (ref 3.5–5.1)
SODIUM: 139 meq/L (ref 136–145)
Total Bilirubin: <0.2 mg/dL (ref 0.20–1.20)
Total Protein: 7.8 g/dL (ref 6.4–8.3)

## 2014-12-06 LAB — TECHNOLOGIST REVIEW

## 2014-12-06 MED ORDER — CYANOCOBALAMIN 1000 MCG/ML IJ SOLN
INTRAMUSCULAR | Status: AC
  Filled 2014-12-06: qty 1

## 2014-12-06 MED ORDER — SODIUM CHLORIDE 0.9 % IJ SOLN
10.0000 mL | INTRAMUSCULAR | Status: DC | PRN
  Administered 2014-12-06: 10 mL
  Filled 2014-12-06: qty 10

## 2014-12-06 MED ORDER — DEXAMETHASONE SODIUM PHOSPHATE 100 MG/10ML IJ SOLN
Freq: Once | INTRAMUSCULAR | Status: AC
  Administered 2014-12-06: 13:00:00 via INTRAVENOUS
  Filled 2014-12-06: qty 8

## 2014-12-06 MED ORDER — SODIUM CHLORIDE 0.9 % IV SOLN
Freq: Once | INTRAVENOUS | Status: AC
Start: 2014-12-06 — End: 2014-12-06
  Administered 2014-12-06: 13:00:00 via INTRAVENOUS

## 2014-12-06 MED ORDER — HEPARIN SOD (PORK) LOCK FLUSH 100 UNIT/ML IV SOLN
500.0000 [IU] | Freq: Once | INTRAVENOUS | Status: AC | PRN
  Administered 2014-12-06: 500 [IU]
  Filled 2014-12-06: qty 5

## 2014-12-06 MED ORDER — CYANOCOBALAMIN 1000 MCG/ML IJ SOLN
1000.0000 ug | Freq: Once | INTRAMUSCULAR | Status: AC
  Administered 2014-12-06: 1000 ug via INTRAMUSCULAR

## 2014-12-06 MED ORDER — SODIUM CHLORIDE 0.9 % IV SOLN
473.5000 mg | Freq: Once | INTRAVENOUS | Status: AC
  Administered 2014-12-06: 470 mg via INTRAVENOUS
  Filled 2014-12-06: qty 47

## 2014-12-06 MED ORDER — LIDOCAINE-PRILOCAINE 2.5-2.5 % EX CREA
TOPICAL_CREAM | CUTANEOUS | Status: AC
  Filled 2014-12-06: qty 5

## 2014-12-06 MED ORDER — SODIUM CHLORIDE 0.9 % IV SOLN
500.0000 mg/m2 | Freq: Once | INTRAVENOUS | Status: AC
  Administered 2014-12-06: 900 mg via INTRAVENOUS
  Filled 2014-12-06: qty 36

## 2014-12-06 NOTE — Patient Instructions (Signed)
Pemetrexed injection What is this medicine? PEMETREXED (PEM e TREX ed) is a chemotherapy drug. This medicine affects cells that are rapidly growing, such as cancer cells and cells in your mouth and stomach. It is usually used to treat lung cancers like non-small cell lung cancer and mesothelioma. It may also be used to treat other cancers. This medicine may be used for other purposes; ask your health care provider or pharmacist if you have questions. COMMON BRAND NAME(S): Alimta What should I tell my health care provider before I take this medicine? They need to know if you have any of these conditions: -if you frequently drink alcohol containing beverages -infection (especially a virus infection such as chickenpox, cold sores, or herpes) -kidney disease -liver disease -low blood counts, like low platelets, red bloods, or white blood cells -an unusual or allergic reaction to pemetrexed, mannitol, other medicines, foods, dyes, or preservatives -pregnant or trying to get pregnant -breast-feeding How should I use this medicine? This drug is given as an infusion into a vein. It is administered in a hospital or clinic by a specially trained health care professional. Talk to your pediatrician regarding the use of this medicine in children. Special care may be needed. Overdosage: If you think you have taken too much of this medicine contact a poison control center or emergency room at once. NOTE: This medicine is only for you. Do not share this medicine with others. What if I miss a dose? It is important not to miss your dose. Call your doctor or health care professional if you are unable to keep an appointment. What may interact with this medicine? -aspirin and aspirin-like medicines -medicines to increase blood counts like filgrastim, pegfilgrastim, sargramostim -methotrexate -NSAIDS, medicines for pain and inflammation, like ibuprofen or naproxen -probenecid -pyrimethamine -vaccines Talk to  your doctor or health care professional before taking any of these medicines: -acetaminophen -aspirin -ibuprofen -ketoprofen -naproxen This list may not describe all possible interactions. Give your health care provider a list of all the medicines, herbs, non-prescription drugs, or dietary supplements you use. Also tell them if you smoke, drink alcohol, or use illegal drugs. Some items may interact with your medicine. What should I watch for while using this medicine? Visit your doctor for checks on your progress. This drug may make you feel generally unwell. This is not uncommon, as chemotherapy can affect healthy cells as well as cancer cells. Report any side effects. Continue your course of treatment even though you feel ill unless your doctor tells you to stop. In some cases, you may be given additional medicines to help with side effects. Follow all directions for their use. Call your doctor or health care professional for advice if you get a fever, chills or sore throat, or other symptoms of a cold or flu. Do not treat yourself. This drug decreases your body's ability to fight infections. Try to avoid being around people who are sick. This medicine may increase your risk to bruise or bleed. Call your doctor or health care professional if you notice any unusual bleeding. Be careful brushing and flossing your teeth or using a toothpick because you may get an infection or bleed more easily. If you have any dental work done, tell your dentist you are receiving this medicine. Avoid taking products that contain aspirin, acetaminophen, ibuprofen, naproxen, or ketoprofen unless instructed by your doctor. These medicines may hide a fever. Call your doctor or health care professional if you get diarrhea or mouth sores. Do not treat  yourself. To protect your kidneys, drink water or other fluids as directed while you are taking this medicine. Men and women must use effective birth control while taking this  medicine. You may also need to continue using effective birth control for a time after stopping this medicine. Do not become pregnant while taking this medicine. Tell your doctor right away if you think that you or your partner might be pregnant. There is a potential for serious side effects to an unborn child. Talk to your health care professional or pharmacist for more information. Do not breast-feed an infant while taking this medicine. This medicine may lower sperm counts. What side effects may I notice from receiving this medicine? Side effects that you should report to your doctor or health care professional as soon as possible: -allergic reactions like skin rash, itching or hives, swelling of the face, lips, or tongue -low blood counts - this medicine may decrease the number of white blood cells, red blood cells and platelets. You may be at increased risk for infections and bleeding. -signs of infection - fever or chills, cough, sore throat, pain or difficulty passing urine -signs of decreased platelets or bleeding - bruising, pinpoint red spots on the skin, black, tarry stools, blood in the urine -signs of decreased red blood cells - unusually weak or tired, fainting spells, lightheadedness -breathing problems, like a dry cough -changes in emotions or moods -chest pain -confusion -diarrhea -high blood pressure -mouth or throat sores or ulcers -pain, swelling, warmth in the leg -pain on swallowing -swelling of the ankles, feet, hands -trouble passing urine or change in the amount of urine -vomiting -yellowing of the eyes or skin Side effects that usually do not require medical attention (report to your doctor or health care professional if they continue or are bothersome): -hair loss -loss of appetite -nausea -stomach upset This list may not describe all possible side effects. Call your doctor for medical advice about side effects. You may report side effects to FDA at  1-800-FDA-1088. Where should I keep my medicine? This drug is given in a hospital or clinic and will not be stored at home. NOTE: This sheet is a summary. It may not cover all possible information. If you have questions about this medicine, talk to your doctor, pharmacist, or health care provider.  2015, Elsevier/Gold Standard. (2008-03-09 13:24:03) Carboplatin injection What is this medicine? CARBOPLATIN (KAR boe pla tin) is a chemotherapy drug. It targets fast dividing cells, like cancer cells, and causes these cells to die. This medicine is used to treat ovarian cancer and many other cancers. This medicine may be used for other purposes; ask your health care provider or pharmacist if you have questions. COMMON BRAND NAME(S): Paraplatin What should I tell my health care provider before I take this medicine? They need to know if you have any of these conditions: -blood disorders -hearing problems -kidney disease -recent or ongoing radiation therapy -an unusual or allergic reaction to carboplatin, cisplatin, other chemotherapy, other medicines, foods, dyes, or preservatives -pregnant or trying to get pregnant -breast-feeding How should I use this medicine? This drug is usually given as an infusion into a vein. It is administered in a hospital or clinic by a specially trained health care professional. Talk to your pediatrician regarding the use of this medicine in children. Special care may be needed. Overdosage: If you think you have taken too much of this medicine contact a poison control center or emergency room at once. NOTE: This medicine is  only for you. Do not share this medicine with others. What if I miss a dose? It is important not to miss a dose. Call your doctor or health care professional if you are unable to keep an appointment. What may interact with this medicine? -medicines for seizures -medicines to increase blood counts like filgrastim, pegfilgrastim,  sargramostim -some antibiotics like amikacin, gentamicin, neomycin, streptomycin, tobramycin -vaccines Talk to your doctor or health care professional before taking any of these medicines: -acetaminophen -aspirin -ibuprofen -ketoprofen -naproxen This list may not describe all possible interactions. Give your health care provider a list of all the medicines, herbs, non-prescription drugs, or dietary supplements you use. Also tell them if you smoke, drink alcohol, or use illegal drugs. Some items may interact with your medicine. What should I watch for while using this medicine? Your condition will be monitored carefully while you are receiving this medicine. You will need important blood work done while you are taking this medicine. This drug may make you feel generally unwell. This is not uncommon, as chemotherapy can affect healthy cells as well as cancer cells. Report any side effects. Continue your course of treatment even though you feel ill unless your doctor tells you to stop. In some cases, you may be given additional medicines to help with side effects. Follow all directions for their use. Call your doctor or health care professional for advice if you get a fever, chills or sore throat, or other symptoms of a cold or flu. Do not treat yourself. This drug decreases your body's ability to fight infections. Try to avoid being around people who are sick. This medicine may increase your risk to bruise or bleed. Call your doctor or health care professional if you notice any unusual bleeding. Be careful brushing and flossing your teeth or using a toothpick because you may get an infection or bleed more easily. If you have any dental work done, tell your dentist you are receiving this medicine. Avoid taking products that contain aspirin, acetaminophen, ibuprofen, naproxen, or ketoprofen unless instructed by your doctor. These medicines may hide a fever. Do not become pregnant while taking this  medicine. Women should inform their doctor if they wish to become pregnant or think they might be pregnant. There is a potential for serious side effects to an unborn child. Talk to your health care professional or pharmacist for more information. Do not breast-feed an infant while taking this medicine. What side effects may I notice from receiving this medicine? Side effects that you should report to your doctor or health care professional as soon as possible: -allergic reactions like skin rash, itching or hives, swelling of the face, lips, or tongue -signs of infection - fever or chills, cough, sore throat, pain or difficulty passing urine -signs of decreased platelets or bleeding - bruising, pinpoint red spots on the skin, black, tarry stools, nosebleeds -signs of decreased red blood cells - unusually weak or tired, fainting spells, lightheadedness -breathing problems -changes in hearing -changes in vision -chest pain -high blood pressure -low blood counts - This drug may decrease the number of white blood cells, red blood cells and platelets. You may be at increased risk for infections and bleeding. -nausea and vomiting -pain, swelling, redness or irritation at the injection site -pain, tingling, numbness in the hands or feet -problems with balance, talking, walking -trouble passing urine or change in the amount of urine Side effects that usually do not require medical attention (report to your doctor or health care professional  if they continue or are bothersome): -hair loss -loss of appetite -metallic taste in the mouth or changes in taste This list may not describe all possible side effects. Call your doctor for medical advice about side effects. You may report side effects to FDA at 1-800-FDA-1088. Where should I keep my medicine? This drug is given in a hospital or clinic and will not be stored at home. NOTE: This sheet is a summary. It may not cover all possible information. If you  have questions about this medicine, talk to your doctor, pharmacist, or health care provider.  2015, Elsevier/Gold Standard. (2007-11-11 14:38:05)

## 2014-12-06 NOTE — Progress Notes (Addendum)
[facility] [phone]   [phone]  OFFICE PROGRESS NOTE  [name], [name], MD [place] [address]  DIAGNOSIS: Stage IV (T2b, N2, M1b) non-small cell lung cancer, adenocarcinoma with a large right hilar mass as well as mediastinal lymphadenopathy and metastatic disease to the liver, bone and pancreas diagnosed in February 2016. He is [employer] molecular testing did not reveal any actionable molecular alterations  PRIOR THERAPY: None  CURRENT THERAPY: Systemic chemotherapy with carboplatin for AUC of 5 and Alimta 500 MG/M2 every 3 weeks. Status post 2 cycles.  INTERVAL HISTORY: Francisco Love 54 y.o. male returns to the clinic today for follow-up visit. The patient is tolerating his systemic chemotherapy with carboplatin and Alimta fairly well with no significant adverse effects. He denied having any significant fever or chills, no nausea or vomiting. The patient denied having any significant chest pain but continues to have shortness of breath which is improved compared to before starting the chemotherapy. He denied having any significant cough or hemoptysis. He reports having to go to the emergency room this weekend because of severe constipation. He takes oxycodone daily for pain management and did not realize he could have some issues with constipation as a result. The constipation is now resolved. He is currently taking 2 stool softeners daily and MiraLax about 3 times per week. He presents for cycle #3.  MEDICAL HISTORY: Past Medical History  Diagnosis Date  . Shortness of breath dyspnea     ALLERGIES:  has No Known Allergies.  MEDICATIONS:  Current Outpatient Prescriptions  Medication Sig Dispense Refill  . acetaminophen (TYLENOL) 325 MG tablet Take 2 tablets (650 mg total) by mouth every 6 (six) hours as needed for mild pain (or Fever >/= 101).    Marland Kitchen albuterol (PROVENTIL) (2.5 MG/3ML) 0.083% nebulizer solution  Take 3 mLs (2.5 mg total) by nebulization every 2 (two) hours as needed for wheezing. 75 mL 12  . dexamethasone (DECADRON) 4 MG tablet Take 1 tablet (4 mg total) by mouth 2 (two) times daily with a meal. 30 tablet 0  . diphenhydrAMINE (SOMINEX) 25 MG tablet Take 25 mg by mouth as needed for itching or sleep.    . feeding supplement, ENSURE COMPLETE, (ENSURE COMPLETE) LIQD Take 237 mLs by mouth 3 (three) times daily between meals.    . folic acid (FOLVITE) 1 MG tablet Take 1 tablet (1 mg total) by mouth daily. 30 tablet 0  . guaiFENesin-dextromethorphan (ROBITUSSIN DM) 100-10 MG/5ML syrup Take 5 mLs by mouth every 4 (four) hours as needed for cough. 118 mL 0  . mirtazapine (REMERON) 15 MG tablet Take 1 tablet (15 mg total) by mouth at bedtime.    Marland Kitchen oxyCODONE (OXY IR/ROXICODONE) 5 MG immediate release tablet Take 1 tablet (5 mg total) by mouth every 4 (four) hours as needed for moderate pain. 20 tablet 0  . prochlorperazine (COMPAZINE) 10 MG tablet Take 1 tablet (10 mg total) by mouth every 6 (six) hours as needed for nausea or vomiting. 30 tablet 1  . senna-docusate (SENOKOT-S) 8.6-50 MG per tablet Take 1 tablet by mouth daily.     No current facility-administered medications for this visit.   Facility-Administered Medications Ordered in Other Visits  Medication Dose Route Frequency Provider Last Rate Last Dose  . sodium chloride 0.9 % injection 10 mL  10 mL Intracatheter PRN Curt Bears, MD   10 mL at 12/06/14 1400    SURGICAL HISTORY:  Past Surgical History  Procedure Laterality Date  . Appendectomy    . Video bronchoscopy N/A 09/21/2014    Procedure: VIDEO BRONCHOSCOPY WITH FLUORO;  Surgeon: Rigoberto Noel, MD;  Location: [facility];  Service: Cardiopulmonary;  Laterality: N/A;  . Video bronchoscopy with endobronchial ultrasound N/A 09/23/2014    Procedure: VIDEO BRONCHOSCOPY WITH ENDOBRONCHIAL ULTRASOUND;  Surgeon: Rigoberto Noel, MD;  Location: [facility];  Service: Pulmonary;  Laterality:  N/A;    REVIEW OF SYSTEMS:  A comprehensive review of systems was negative except for: Constitutional: positive for fatigue Respiratory: positive for dyspnea on exertion Gastrointestinal: positive for constipation   PHYSICAL EXAMINATION: General appearance: alert, cooperative, fatigued and no distress Head: Normocephalic, without obvious abnormality, atraumatic Neck: no adenopathy, no JVD, supple, symmetrical, trachea midline and thyroid not enlarged, symmetric, no tenderness/mass/nodules Lymph nodes: Cervical, supraclavicular, and axillary nodes normal. Resp: clear to auscultation bilaterally Back: symmetric, no curvature. ROM normal. No CVA tenderness. Cardio: regular rate and rhythm, S1, S2 normal, no murmur, click, rub or gallop GI: soft, non-tender; bowel sounds normal; no masses,  no organomegaly Extremities: extremities normal, atraumatic, no cyanosis or edema  ECOG PERFORMANCE STATUS: 2 - Symptomatic, <50% confined to bed  Blood pressure 99/69, pulse 65, temperature 97.7 F (36.5 C), temperature source Oral, resp. rate 18, height _0  (1.753 m), weight 142 lb 9.6 oz (64.683 kg), SpO2 98 %.  LABORATORY DATA: Lab Results  Component Value Date   WBC 12.3* 12/06/2014   HGB 9.9* 12/06/2014   HCT 31.1* 12/06/2014   MCV 83.3 12/06/2014   PLT 567* 12/06/2014      Chemistry      Component Value Date/Time   NA 139 12/06/2014 0926   NA 135 10/25/2014 0845   K 5.6* 12/06/2014 0926   K 5.3* 10/25/2014 0845   CL 108 10/25/2014 0845   CO2 16* 12/06/2014 0926   CO2 19 10/25/2014 0845   BUN 18.3 12/06/2014 0926   BUN 22 10/25/2014 0845   CREATININE 1.1 12/06/2014 0926   CREATININE 1.03 10/25/2014 0845      Component Value Date/Time   CALCIUM 8.7 12/06/2014 0926   CALCIUM 8.6 10/25/2014 0845   ALKPHOS 613* 12/06/2014 0926   ALKPHOS 882* 09/20/2014 1120   AST 29 12/06/2014 0926   AST 29 09/20/2014 1120   ALT 34 12/06/2014 0926   ALT 34 09/20/2014 1120   BILITOT <0.20  12/06/2014 0926   BILITOT 0.1* 09/20/2014 1120       RADIOGRAPHIC STUDIES: No results found.  ASSESSMENT AND PLAN: This is a very pleasant 54 years old African-American male recently diagnosed with a stage IV non-small cell lung cancer, adenocarcinoma. He is currently undergoing systemic chemotherapy was carboplatin and Alimta status post 1 cycle and tolerating his treatment fairly well. The molecular study by [employer] did not reveal any actionable mutations. The patient was discussed with and also seen by Dr. Julien Nordmann. He will proceed with cycle #3 today as scheduled. He will follow-up up in 3 weeks for a restaging CT scan of his chest, abdomen and pelvis with contrast to reevaluate his disease. He will continue with weekly labs in the interim.  He was advised to call immediately if he has any concerning symptoms in the interval. The patient voices understanding of current disease status and treatment options and is in agreement with the current care plan.  All questions were answered. The patient knows to call the clinic with any problems, questions or concerns. We can certainly  see the patient much sooner if necessary.  Wynetta Emery, Joye Wesenberg E, PA-C 12/06/2014  --Of note patient's chemistries became available later in the day revealing potassium of 5.6. We'll have the patient return for a repeat potassium level. If it remains high we will Institute appropriate potassium lowering treatment.  ADDENDUM: Hematology/Oncology Attending: I had a face to face encounter with the patient. I recommended his care plan. This is a very pleasant 54 years old African-American male with stage IV non-small cell lung cancer, adenocarcinoma with negative EGFR mutation and negative for gene translocation. The patient is currently undergoing systemic chemotherapy with carboplatin and Alimta status post 2 cycles. He is tolerating his treatment fairly well with no significant adverse effects. He denied having any  significant nausea or vomiting, no fever or chills. I recommended for the patient to proceed with cycle #3 today as a scheduled. He would come back for follow-up visit in 3 weeks for reevaluation after repeating CT scan of the chest, abdomen and pelvis. The patient was advised to call immediately if he has any concerning symptoms in the interval.  Disclaimer: This note was dictated with voice recognition software. Similar sounding words can inadvertently be transcribed and may not be corrected upon review. Eilleen Kempf., MD 12/06/2014

## 2014-12-06 NOTE — Telephone Encounter (Signed)
gave and printed appt sched and avs fo rpt for April and May.Marland KitchenMarland KitchenMarland Kitchen

## 2014-12-06 NOTE — Patient Instructions (Signed)
Your potassium level came back slightly high. Return as requested for a repeat test Continue weekly labs as scheduled Follow up in 3 weeks with a restaging Ct scan of your chest, abdomen and pelvis to re-evaluate your disease.

## 2014-12-07 ENCOUNTER — Telehealth: Payer: Self-pay | Admitting: Internal Medicine

## 2014-12-07 NOTE — Telephone Encounter (Signed)
lvm for pt regarding to 4.20 lab.Marland KitchenMarland Kitchen

## 2014-12-08 ENCOUNTER — Other Ambulatory Visit: Payer: Medicare Other

## 2014-12-09 ENCOUNTER — Telehealth: Payer: Self-pay | Admitting: Internal Medicine

## 2014-12-09 NOTE — Telephone Encounter (Signed)
returned call and s.w. pt brother in law and pt is concerned about labs being done in [place] need orders sent....transferred to desk nurse

## 2014-12-13 ENCOUNTER — Other Ambulatory Visit: Payer: Medicare Other

## 2014-12-20 ENCOUNTER — Other Ambulatory Visit: Payer: Medicare Other

## 2014-12-21 ENCOUNTER — Telehealth: Payer: Self-pay | Admitting: *Deleted

## 2014-12-21 NOTE — Telephone Encounter (Signed)
PT. [place]. MEDICARE NUMBER GIVEN TO REPRESENTATIVE.

## 2014-12-22 ENCOUNTER — Telehealth: Payer: Self-pay | Admitting: *Deleted

## 2014-12-22 NOTE — Telephone Encounter (Signed)
Pt scheduled for lab repeat CBC  Infusion -2 units RBC transfusion 12/24/14. Orders entered for blood Called pt with appt Date/Time HAR completed Confirmed with pt 5/6 appts

## 2014-12-23 ENCOUNTER — Ambulatory Visit (HOSPITAL_COMMUNITY)
Admission: RE | Admit: 2014-12-23 | Discharge: 2014-12-23 | Disposition: A | Discharge status: Home / Self Care | Payer: Medicare Other | Source: Ambulatory Visit | Attending: Internal Medicine | Admitting: Internal Medicine

## 2014-12-23 DIAGNOSIS — D6481 Anemia due to antineoplastic chemotherapy: Secondary | ICD-10-CM

## 2014-12-23 DIAGNOSIS — T451X5A Adverse effect of antineoplastic and immunosuppressive drugs, initial encounter: Secondary | ICD-10-CM | POA: Insufficient documentation

## 2014-12-24 ENCOUNTER — Telehealth: Payer: Self-pay | Admitting: Medical Oncology

## 2014-12-24 ENCOUNTER — Other Ambulatory Visit (HOSPITAL_COMMUNITY): Admission: RE | Admit: 2014-12-24 | Payer: Medicaid Other | Source: Ambulatory Visit | Admitting: Internal Medicine

## 2014-12-24 ENCOUNTER — Ambulatory Visit (HOSPITAL_BASED_OUTPATIENT_CLINIC_OR_DEPARTMENT_OTHER): Payer: Medicaid Other

## 2014-12-24 ENCOUNTER — Ambulatory Visit (HOSPITAL_COMMUNITY)
Admission: RE | Admit: 2014-12-24 | Discharge: 2014-12-24 | Disposition: A | Discharge status: Home / Self Care | Payer: Medicare Other | Source: Ambulatory Visit | Attending: Internal Medicine | Admitting: Internal Medicine

## 2014-12-24 ENCOUNTER — Encounter (HOSPITAL_COMMUNITY): Payer: Self-pay

## 2014-12-24 ENCOUNTER — Ambulatory Visit (HOSPITAL_COMMUNITY)
Admission: RE | Admit: 2014-12-24 | Discharge: 2014-12-24 | Disposition: A | Discharge status: Home / Self Care | Payer: Medicaid Other | Source: Ambulatory Visit | Attending: Physician Assistant | Admitting: Physician Assistant

## 2014-12-24 ENCOUNTER — Other Ambulatory Visit (HOSPITAL_BASED_OUTPATIENT_CLINIC_OR_DEPARTMENT_OTHER): Payer: Medicaid Other

## 2014-12-24 ENCOUNTER — Other Ambulatory Visit: Payer: Self-pay | Admitting: Medical Oncology

## 2014-12-24 VITALS — BP 89/58 | HR 114 | Temp 97.0°F | Resp 18

## 2014-12-24 DIAGNOSIS — C7951 Secondary malignant neoplasm of bone: Secondary | ICD-10-CM | POA: Diagnosis not present

## 2014-12-24 DIAGNOSIS — R0602 Shortness of breath: Secondary | ICD-10-CM | POA: Diagnosis not present

## 2014-12-24 DIAGNOSIS — T451X5A Adverse effect of antineoplastic and immunosuppressive drugs, initial encounter: Principal | ICD-10-CM

## 2014-12-24 DIAGNOSIS — C801 Malignant (primary) neoplasm, unspecified: Secondary | ICD-10-CM

## 2014-12-24 DIAGNOSIS — D6481 Anemia due to antineoplastic chemotherapy: Secondary | ICD-10-CM

## 2014-12-24 DIAGNOSIS — K5900 Constipation, unspecified: Secondary | ICD-10-CM | POA: Diagnosis not present

## 2014-12-24 DIAGNOSIS — C3491 Malignant neoplasm of unspecified part of right bronchus or lung: Secondary | ICD-10-CM

## 2014-12-24 DIAGNOSIS — Z79899 Other long term (current) drug therapy: Secondary | ICD-10-CM | POA: Diagnosis not present

## 2014-12-24 DIAGNOSIS — Z87891 Personal history of nicotine dependence: Secondary | ICD-10-CM | POA: Insufficient documentation

## 2014-12-24 DIAGNOSIS — C787 Secondary malignant neoplasm of liver and intrahepatic bile duct: Secondary | ICD-10-CM

## 2014-12-24 LAB — COMPREHENSIVE METABOLIC PANEL (CC13)
ALBUMIN: 2.5 g/dL — AB (ref 3.5–5.0)
ALK PHOS: 491 U/L — AB (ref 40–150)
ALT: 28 U/L (ref 0–55)
AST: 21 U/L (ref 5–34)
Anion Gap: 14 mEq/L — ABNORMAL HIGH (ref 3–11)
BUN: 15.4 mg/dL (ref 7.0–26.0)
CO2: 14 meq/L — AB (ref 22–29)
Calcium: 8.7 mg/dL (ref 8.4–10.4)
Chloride: 112 mEq/L — ABNORMAL HIGH (ref 98–109)
Creatinine: 1 mg/dL (ref 0.7–1.3)
Glucose: 76 mg/dl (ref 70–140)
Potassium: 5.1 mEq/L (ref 3.5–5.1)
Sodium: 140 mEq/L (ref 136–145)
Total Bilirubin: <0.2 mg/dL (ref 0.20–1.20)
Total Protein: 7.2 g/dL (ref 6.4–8.3)

## 2014-12-24 LAB — CBC WITH DIFFERENTIAL/PLATELET
BASO%: 0.2 % (ref 0.0–2.0)
Basophils Absolute: 0 10*3/uL (ref 0.0–0.1)
EOS ABS: 0.1 10*3/uL (ref 0.0–0.5)
EOS%: 1.6 % (ref 0.0–7.0)
HCT: 25.9 % — ABNORMAL LOW (ref 38.4–49.9)
HEMOGLOBIN: 8.5 g/dL — AB (ref 13.0–17.1)
LYMPH#: 2.5 10*3/uL (ref 0.9–3.3)
LYMPH%: 33.5 % (ref 14.0–49.0)
MCH: 28.1 pg (ref 27.2–33.4)
MCHC: 32.9 g/dL (ref 32.0–36.0)
MCV: 85.4 fL (ref 79.3–98.0)
MONO#: 1.1 10*3/uL — AB (ref 0.1–0.9)
MONO%: 14.5 % — ABNORMAL HIGH (ref 0.0–14.0)
NEUT%: 50.2 % (ref 39.0–75.0)
NEUTROS ABS: 3.8 10*3/uL (ref 1.5–6.5)
Platelets: 334 10*3/uL (ref 140–400)
RBC: 3.03 10*6/uL — AB (ref 4.20–5.82)
RDW: 19.9 % — ABNORMAL HIGH (ref 11.0–14.6)
WBC: 7.5 10*3/uL (ref 4.0–10.3)

## 2014-12-24 LAB — HOLD TUBE, BLOOD BANK

## 2014-12-24 LAB — PREPARE RBC (CROSSMATCH)

## 2014-12-24 LAB — ABO/RH: ABO/RH(D): A POS

## 2014-12-24 MED ORDER — IOHEXOL 300 MG/ML  SOLN
100.0000 mL | Freq: Once | INTRAMUSCULAR | Status: AC | PRN
  Administered 2014-12-24: 100 mL via INTRAVENOUS

## 2014-12-24 MED ORDER — DIPHENHYDRAMINE HCL 25 MG PO CAPS
25.0000 mg | ORAL_CAPSULE | Freq: Once | ORAL | Status: AC
  Administered 2014-12-24: 25 mg via ORAL

## 2014-12-24 MED ORDER — DIPHENHYDRAMINE HCL 25 MG PO CAPS
ORAL_CAPSULE | ORAL | Status: AC
  Filled 2014-12-24: qty 1

## 2014-12-24 MED ORDER — ACETAMINOPHEN 325 MG PO TABS
650.0000 mg | ORAL_TABLET | Freq: Once | ORAL | Status: AC
  Administered 2014-12-24: 650 mg via ORAL

## 2014-12-24 MED ORDER — ACETAMINOPHEN 325 MG PO TABS
ORAL_TABLET | ORAL | Status: AC
  Filled 2014-12-24: qty 2

## 2014-12-24 MED ORDER — SODIUM CHLORIDE 0.9 % IV SOLN
250.0000 mL | Freq: Once | INTRAVENOUS | Status: AC
  Administered 2014-12-24: 250 mL via INTRAVENOUS

## 2014-12-24 NOTE — Patient Instructions (Signed)

## 2014-12-24 NOTE — Telephone Encounter (Signed)
Pt hgb today 8.5, better than on 5/3. Pt reports he is very fatigued and feels like he needs blood. Consulted with Cyndee bacon and pt ordered to received 1 unit of blood today. [place] notified.

## 2014-12-25 LAB — TYPE AND SCREEN
ABO/RH(D): A POS
ANTIBODY SCREEN: NEGATIVE
UNIT DIVISION: 0

## 2014-12-27 ENCOUNTER — Encounter: Payer: Self-pay | Admitting: Internal Medicine

## 2014-12-27 ENCOUNTER — Ambulatory Visit (HOSPITAL_BASED_OUTPATIENT_CLINIC_OR_DEPARTMENT_OTHER): Payer: Medicaid Other | Admitting: Internal Medicine

## 2014-12-27 ENCOUNTER — Telehealth: Payer: Self-pay | Admitting: Internal Medicine

## 2014-12-27 ENCOUNTER — Other Ambulatory Visit (HOSPITAL_BASED_OUTPATIENT_CLINIC_OR_DEPARTMENT_OTHER): Payer: Medicaid Other

## 2014-12-27 ENCOUNTER — Ambulatory Visit (HOSPITAL_BASED_OUTPATIENT_CLINIC_OR_DEPARTMENT_OTHER): Payer: Medicaid Other

## 2014-12-27 VITALS — BP 117/76 | HR 102 | Temp 97.7°F | Resp 18 | Ht 69.0 in | Wt 140.5 lb

## 2014-12-27 DIAGNOSIS — C3411 Malignant neoplasm of upper lobe, right bronchus or lung: Secondary | ICD-10-CM | POA: Diagnosis not present

## 2014-12-27 DIAGNOSIS — D6481 Anemia due to antineoplastic chemotherapy: Secondary | ICD-10-CM | POA: Diagnosis not present

## 2014-12-27 DIAGNOSIS — Z5111 Encounter for antineoplastic chemotherapy: Secondary | ICD-10-CM

## 2014-12-27 DIAGNOSIS — C3491 Malignant neoplasm of unspecified part of right bronchus or lung: Secondary | ICD-10-CM

## 2014-12-27 DIAGNOSIS — C7989 Secondary malignant neoplasm of other specified sites: Secondary | ICD-10-CM | POA: Diagnosis not present

## 2014-12-27 DIAGNOSIS — C7951 Secondary malignant neoplasm of bone: Secondary | ICD-10-CM

## 2014-12-27 DIAGNOSIS — C787 Secondary malignant neoplasm of liver and intrahepatic bile duct: Secondary | ICD-10-CM

## 2014-12-27 DIAGNOSIS — C801 Malignant (primary) neoplasm, unspecified: Principal | ICD-10-CM

## 2014-12-27 LAB — COMPREHENSIVE METABOLIC PANEL (CC13)
ALBUMIN: 2.6 g/dL — AB (ref 3.5–5.0)
ALT: 27 U/L (ref 0–55)
ANION GAP: 12 meq/L — AB (ref 3–11)
AST: 24 U/L (ref 5–34)
Alkaline Phosphatase: 456 U/L — ABNORMAL HIGH (ref 40–150)
BUN: 23.7 mg/dL (ref 7.0–26.0)
CALCIUM: 8.7 mg/dL (ref 8.4–10.4)
CHLORIDE: 112 meq/L — AB (ref 98–109)
CO2: 15 meq/L — AB (ref 22–29)
Creatinine: 1.1 mg/dL (ref 0.7–1.3)
EGFR: 85 mL/min/{1.73_m2} — ABNORMAL LOW (ref >90)
Glucose: 88 mg/dl (ref 70–140)
POTASSIUM: 4.8 meq/L (ref 3.5–5.1)
Sodium: 139 mEq/L (ref 136–145)
TOTAL PROTEIN: 7.5 g/dL (ref 6.4–8.3)
Total Bilirubin: <0.2 mg/dL (ref 0.20–1.20)

## 2014-12-27 LAB — CBC WITH DIFFERENTIAL/PLATELET
BASO%: 0.2 % (ref 0.0–2.0)
BASOS ABS: 0 10*3/uL (ref 0.0–0.1)
EOS%: 1.1 % (ref 0.0–7.0)
Eosinophils Absolute: 0.1 10*3/uL (ref 0.0–0.5)
HCT: 32 % — ABNORMAL LOW (ref 38.4–49.9)
HEMOGLOBIN: 10.5 g/dL — AB (ref 13.0–17.1)
LYMPH#: 1.9 10*3/uL (ref 0.9–3.3)
LYMPH%: 22.7 % (ref 14.0–49.0)
MCH: 28.3 pg (ref 27.2–33.4)
MCHC: 32.8 g/dL (ref 32.0–36.0)
MCV: 86.3 fL (ref 79.3–98.0)
MONO#: 0.7 10*3/uL (ref 0.1–0.9)
MONO%: 8.9 % (ref 0.0–14.0)
NEUT#: 5.5 10*3/uL (ref 1.5–6.5)
NEUT%: 67.1 % (ref 39.0–75.0)
Platelets: 340 10*3/uL (ref 140–400)
RBC: 3.71 10*6/uL — ABNORMAL LOW (ref 4.20–5.82)
RDW: 21.7 % — ABNORMAL HIGH (ref 11.0–14.6)
WBC: 8.2 10*3/uL (ref 4.0–10.3)

## 2014-12-27 MED ORDER — SODIUM CHLORIDE 0.9 % IV SOLN
500.0000 mg/m2 | Freq: Once | INTRAVENOUS | Status: AC
  Administered 2014-12-27: 900 mg via INTRAVENOUS
  Filled 2014-12-27: qty 36

## 2014-12-27 MED ORDER — SODIUM CHLORIDE 0.9 % IV SOLN
Freq: Once | INTRAVENOUS | Status: AC
  Administered 2014-12-27: 12:00:00 via INTRAVENOUS

## 2014-12-27 MED ORDER — DEXAMETHASONE SODIUM PHOSPHATE 100 MG/10ML IJ SOLN
Freq: Once | INTRAMUSCULAR | Status: AC
  Administered 2014-12-27: 12:00:00 via INTRAVENOUS
  Filled 2014-12-27: qty 8

## 2014-12-27 MED ORDER — SODIUM CHLORIDE 0.9 % IJ SOLN
10.0000 mL | INTRAMUSCULAR | Status: DC | PRN
  Administered 2014-12-27: 10 mL
  Filled 2014-12-27: qty 10

## 2014-12-27 MED ORDER — CARBOPLATIN CHEMO INJECTION 600 MG/60ML
508.0000 mg | Freq: Once | INTRAVENOUS | Status: AC
  Administered 2014-12-27: 510 mg via INTRAVENOUS
  Filled 2014-12-27: qty 51

## 2014-12-27 MED ORDER — HEPARIN SOD (PORK) LOCK FLUSH 100 UNIT/ML IV SOLN
500.0000 [IU] | Freq: Once | INTRAVENOUS | Status: AC | PRN
  Administered 2014-12-27: 500 [IU]
  Filled 2014-12-27: qty 5

## 2014-12-27 MED ORDER — OXYCODONE HCL 5 MG PO TABS: 5.0000 mg | ORAL_TABLET | ORAL | Status: DC | PRN

## 2014-12-27 NOTE — Progress Notes (Signed)
[facility] [phone]   [phone]  OFFICE PROGRESS NOTE  [name], [name], MD [place] [address]  DIAGNOSIS: Stage IV (T2b, N2, M1b) non-small cell lung cancer, adenocarcinoma with negative EGFR mutation and negative gene translocation presented with a large right hilar mass as well as mediastinal lymphadenopathy and metastatic disease to the liver, bone and pancreas diagnosed in February 2016.  PRIOR THERAPY: None  CURRENT THERAPY: Systemic chemotherapy with carboplatin for AUC of 5 and Alimta 500 MG/M2 every 3 weeks. Status post 3 cycles.  INTERVAL HISTORY: Francisco Love 54 y.o. male returns to the clinic today for follow-up visit accompanied by 2 family members. The patient tolerated the last cycle of her systemic chemotherapy with carboplatin and Alimta fairly well with no significant adverse effects except for mild fatigue secondary to chemotherapy-induced anemia. He will receive 2 units of PRBCs transfusion recently. He denied having any significant fever or chills, no nausea or vomiting. The patient denied having any significant chest pain but continues to have shortness of breath. He denied having any significant cough or hemoptysis. The patient had repeat CT scan of the chest, abdomen and pelvis performed recently and he is here for evaluation and discussion of his scan results.  MEDICAL HISTORY: Past Medical History  Diagnosis Date  . Shortness of breath dyspnea     ALLERGIES:  has No Known Allergies.  MEDICATIONS:  Current Outpatient Prescriptions  Medication Sig Dispense Refill  . acetaminophen (TYLENOL) 325 MG tablet Take 2 tablets (650 mg total) by mouth every 6 (six) hours as needed for mild pain (or Fever >/= 101).    Marland Kitchen albuterol (PROVENTIL) (2.5 MG/3ML) 0.083% nebulizer solution Take 3 mLs (2.5 mg total) by nebulization every 2 (two) hours as needed for wheezing. 75 mL 12  . dexamethasone (DECADRON)  4 MG tablet Take 1 tablet (4 mg total) by mouth 2 (two) times daily with a meal. 30 tablet 0  . diphenhydrAMINE (SOMINEX) 25 MG tablet Take 25 mg by mouth as needed for itching or sleep.    . feeding supplement, ENSURE COMPLETE, (ENSURE COMPLETE) LIQD Take 237 mLs by mouth 3 (three) times daily between meals.    . folic acid (FOLVITE) 1 MG tablet Take 1 tablet (1 mg total) by mouth daily. 30 tablet 0  . guaiFENesin-dextromethorphan (ROBITUSSIN DM) 100-10 MG/5ML syrup Take 5 mLs by mouth every 4 (four) hours as needed for cough. 118 mL 0  . mirtazapine (REMERON) 15 MG tablet Take 1 tablet (15 mg total) by mouth at bedtime.    Marland Kitchen oxyCODONE (OXY IR/ROXICODONE) 5 MG immediate release tablet Take 1 tablet (5 mg total) by mouth every 4 (four) hours as needed for moderate pain. 20 tablet 0  . prochlorperazine (COMPAZINE) 10 MG tablet Take 1 tablet (10 mg total) by mouth every 6 (six) hours as needed for nausea or vomiting. 30 tablet 1  . senna-docusate (SENOKOT-S) 8.6-50 MG per tablet Take 1 tablet by mouth daily.     No current facility-administered medications for this visit.    SURGICAL HISTORY:  Past Surgical History  Procedure Laterality Date  . Appendectomy    . Video bronchoscopy N/A 09/21/2014    Procedure: VIDEO BRONCHOSCOPY WITH FLUORO;  Surgeon: Rigoberto Noel, MD;  Location: [facility];  Service: Cardiopulmonary;  Laterality: N/A;  . Video bronchoscopy with endobronchial ultrasound N/A 09/23/2014    Procedure: VIDEO BRONCHOSCOPY WITH ENDOBRONCHIAL ULTRASOUND;  Surgeon: Kara Mead  V, MD;  Location: [facility];  Service: Pulmonary;  Laterality: N/A;    REVIEW OF SYSTEMS:  Constitutional: positive for fatigue Eyes: negative Ears, nose, mouth, throat, and face: negative Respiratory: positive for dyspnea on exertion Cardiovascular: negative Gastrointestinal: negative Genitourinary:negative Integument/breast: negative Hematologic/lymphatic: negative Musculoskeletal:negative Neurological:  negative Behavioral/Psych: negative Endocrine: negative Allergic/Immunologic: negative   PHYSICAL EXAMINATION: General appearance: alert, cooperative, fatigued and no distress Head: Normocephalic, without obvious abnormality, atraumatic Neck: no adenopathy, no JVD, supple, symmetrical, trachea midline and thyroid not enlarged, symmetric, no tenderness/mass/nodules Lymph nodes: Cervical, supraclavicular, and axillary nodes normal. Resp: clear to auscultation bilaterally Back: symmetric, no curvature. ROM normal. No CVA tenderness. Cardio: regular rate and rhythm, S1, S2 normal, no murmur, click, rub or gallop GI: soft, non-tender; bowel sounds normal; no masses,  no organomegaly Extremities: extremities normal, atraumatic, no cyanosis or edema  ECOG PERFORMANCE STATUS: 1 - Symptomatic but completely ambulatory  Blood pressure 117/76, pulse 102, temperature 97.7 F (36.5 C), temperature source Oral, resp. rate 18, height 5' 9" (1.753 m), weight 140 lb 8 oz (63.73 kg), SpO2 100 %.  LABORATORY DATA: Lab Results  Component Value Date   WBC 8.2 12/27/2014   HGB 10.5* 12/27/2014   HCT 32.0* 12/27/2014   MCV 86.3 12/27/2014   PLT 340 12/27/2014      Chemistry      Component Value Date/Time   NA 140 12/24/2014 0908   NA 135 10/25/2014 0845   K 5.1 12/24/2014 0908   K 5.3* 10/25/2014 0845   CL 108 10/25/2014 0845   CO2 14* 12/24/2014 0908   CO2 19 10/25/2014 0845   BUN 15.4 12/24/2014 0908   BUN 22 10/25/2014 0845   CREATININE 1.0 12/24/2014 0908   CREATININE 1.03 10/25/2014 0845      Component Value Date/Time   CALCIUM 8.7 12/24/2014 0908   CALCIUM 8.6 10/25/2014 0845   ALKPHOS 491* 12/24/2014 0908   ALKPHOS 882* 09/20/2014 1120   AST 21 12/24/2014 0908   AST 29 09/20/2014 1120   ALT 28 12/24/2014 0908   ALT 34 09/20/2014 1120   BILITOT <0.20 12/24/2014 0908   BILITOT 0.1* 09/20/2014 1120       RADIOGRAPHIC STUDIES: Ct Chest W Contrast  12/24/2014   CLINICAL DATA:   Non-small-cell lung cancer diagnosed 2/16. Chemotherapy in progress. Shortness of breath. Constipation. Bone metastasis.  EXAM: CT CHEST, ABDOMEN, AND PELVIS WITH CONTRAST  TECHNIQUE: Multidetector CT imaging of the chest, abdomen and pelvis was performed following the standard protocol during bolus administration of intravenous contrast.  CONTRAST:  185m OMNIPAQUE IOHEXOL 300 MG/ML  SOLN  COMPARISON:  PET of 10/15/2014. Abdominal pelvic CT of 09/20/2014. Outside chest CT 09/17/2014.  FINDINGS: CT CHEST FINDINGS  Mediastinum/Nodes: No supraclavicular adenopathy. A right Port-A-Cath terminates at the cavoatrial junction versus high right atrium. Right paratracheal adenopathy at 3.3 cm on image 22. 4.2 cm on the prior exam (when remeasured).  A node within the azygoesophageal recess measures 1.5 cm on image 33 and is similar to on the prior exam.  Partially calcified right hilar node measures 1.9 cm on image 29 versus 2.2 cm on the prior exam (when remeasured).  Prevascular node measures 1.1 cm on image 25 versus 1.3 cm on the prior exam (when remeasured).  Lungs/Pleura: Small bilateral pleural effusions are similar to on the prior PET. Centrilobular and paraseptal emphysema. Probable scarring in the left upper lobe on image 20, and image 25. Similar.  Musculoskeletal: widespread sclerotic osseous metastasis. Lesions are better  defined, especially within bilateral ribs. Posterior right fifth rib pathologic fracture on image 22 and more laterally on image 28. Chronic. The fourth anterior rib pathologic fracture is either new or increased with callus deposition.  CT ABDOMEN PELVIS FINDINGS  Hepatobiliary: 6 mm right hepatic lobe cyst is unchanged. Normal gallbladder. Mild biliary ductal dilatation, 9 mm on coronal image 35. This is similar.  Pancreas: Partially calcified multi cystic lesion within the pancreatic head and uncinate process is felt to be similar, including on image 70 of series 2.  Pancreatic duct is  upper normal and similar.  No acute pancreatitis  Spleen: Normal  Adrenals/Urinary Tract: Normal adrenal glands. At least partially duplicated right renal collecting system. Otherwise normal kidneys and urinary bladder.  Stomach/Bowel: Normal stomach, without wall thickening. Colonic stool burden suggests constipation. Normal terminal ileum. Normal small bowel.  Vascular/Lymphatic: Advanced aortic and branch vessel atherosclerosis. Severe stenosis to near occlusion of left common iliac artery on image 92. Chronic.  7 mm right external iliac node on image 109 is similar and not pathologic by size criteria. No abdominopelvic adenopathy.  Reproductive: Normal prostate.  Other: No significant free fluid. Incompletely imaged left-sided hydrocele.  Musculoskeletal: Widespread osseous metastasis which are more well-defined today, sclerotic. Advanced osteoarthritis of both hips.  IMPRESSION: 1. Mild progression of thoracic nodal metastasis. 2. Increased definition of multiple sclerotic osseous metastasis. This could relate to interval healing or less likely disease progression. 3. Grossly similar appearance of an ill-defined multi cystic lesion within the pancreatic head/uncinate process. Similar mild biliary and borderline pancreatic duct dilatation. This could represent a a pseudocyst or indolent cystic neoplasm. 4. Advanced atherosclerosis with near complete occlusion of the left common iliac artery. 5.  Possible constipation. 6. Similar small bilateral pleural effusions.   Electronically Signed   By: Abigail Miyamoto M.D.   On: 12/24/2014 10:36   Ct Abdomen Pelvis W Contrast  12/24/2014   CLINICAL DATA:  Non-small-cell lung cancer diagnosed 2/16. Chemotherapy in progress. Shortness of breath. Constipation. Bone metastasis.  EXAM: CT CHEST, ABDOMEN, AND PELVIS WITH CONTRAST  TECHNIQUE: Multidetector CT imaging of the chest, abdomen and pelvis was performed following the standard protocol during bolus administration of  intravenous contrast.  CONTRAST:  165m OMNIPAQUE IOHEXOL 300 MG/ML  SOLN  COMPARISON:  PET of 10/15/2014. Abdominal pelvic CT of 09/20/2014. Outside chest CT 09/17/2014.  FINDINGS: CT CHEST FINDINGS  Mediastinum/Nodes: No supraclavicular adenopathy. A right Port-A-Cath terminates at the cavoatrial junction versus high right atrium. Right paratracheal adenopathy at 3.3 cm on image 22. 4.2 cm on the prior exam (when remeasured).  A node within the azygoesophageal recess measures 1.5 cm on image 33 and is similar to on the prior exam.  Partially calcified right hilar node measures 1.9 cm on image 29 versus 2.2 cm on the prior exam (when remeasured).  Prevascular node measures 1.1 cm on image 25 versus 1.3 cm on the prior exam (when remeasured).  Lungs/Pleura: Small bilateral pleural effusions are similar to on the prior PET. Centrilobular and paraseptal emphysema. Probable scarring in the left upper lobe on image 20, and image 25. Similar.  Musculoskeletal: widespread sclerotic osseous metastasis. Lesions are better defined, especially within bilateral ribs. Posterior right fifth rib pathologic fracture on image 22 and more laterally on image 28. Chronic. The fourth anterior rib pathologic fracture is either new or increased with callus deposition.  CT ABDOMEN PELVIS FINDINGS  Hepatobiliary: 6 mm right hepatic lobe cyst is unchanged. Normal gallbladder. Mild biliary ductal dilatation, 9  mm on coronal image 35. This is similar.  Pancreas: Partially calcified multi cystic lesion within the pancreatic head and uncinate process is felt to be similar, including on image 70 of series 2.  Pancreatic duct is upper normal and similar.  No acute pancreatitis  Spleen: Normal  Adrenals/Urinary Tract: Normal adrenal glands. At least partially duplicated right renal collecting system. Otherwise normal kidneys and urinary bladder.  Stomach/Bowel: Normal stomach, without wall thickening. Colonic stool burden suggests constipation.  Normal terminal ileum. Normal small bowel.  Vascular/Lymphatic: Advanced aortic and branch vessel atherosclerosis. Severe stenosis to near occlusion of left common iliac artery on image 92. Chronic.  7 mm right external iliac node on image 109 is similar and not pathologic by size criteria. No abdominopelvic adenopathy.  Reproductive: Normal prostate.  Other: No significant free fluid. Incompletely imaged left-sided hydrocele.  Musculoskeletal: Widespread osseous metastasis which are more well-defined today, sclerotic. Advanced osteoarthritis of both hips.  IMPRESSION: 1. Mild progression of thoracic nodal metastasis. 2. Increased definition of multiple sclerotic osseous metastasis. This could relate to interval healing or less likely disease progression. 3. Grossly similar appearance of an ill-defined multi cystic lesion within the pancreatic head/uncinate process. Similar mild biliary and borderline pancreatic duct dilatation. This could represent a a pseudocyst or indolent cystic neoplasm. 4. Advanced atherosclerosis with near complete occlusion of the left common iliac artery. 5.  Possible constipation. 6. Similar small bilateral pleural effusions.   Electronically Signed   By: Abigail Miyamoto M.D.   On: 12/24/2014 10:36    ASSESSMENT AND PLAN: This is a very pleasant 54 years old African-American male recently diagnosed with a stage IV non-small cell lung cancer, adenocarcinoma. He is currently undergoing systemic chemotherapy was carboplatin and Alimta status post 3 cycles and tolerating his treatment fairly well. The molecular study by [employer] showed no evidence for EGFR mutation or active gene translocation. The recent CT scan of the chest, abdomen and pelvis showed some improvement in his disease. There is typing mistake at the impression of the CT scan. I discussed the scan results with the patient and his family and personally reviewed the images.  I recommended for him to continue with his  systemic chemotherapy today as scheduled. He will receive cycle #4 today. He would come back for follow-up visit in 3 weeks with the next cycle of his treatment.  For pain management, he was giving a refill of oxycodone.  He was advised to call immediately if he has any concerning symptoms in the interval. The patient voices understanding of current disease status and treatment options and is in agreement with the current care plan.  All questions were answered. The patient knows to call the clinic with any problems, questions or concerns. We can certainly see the patient much sooner if necessary.   Disclaimer: This note was dictated with voice recognition software. Similar sounding words can inadvertently be transcribed and may not be corrected upon review.

## 2014-12-27 NOTE — Patient Instructions (Addendum)
[facility] Discharge Instructions for Patients Receiving Chemotherapy  Today you received the following chemotherapy agents: Alimta, Carboplatin.  To help prevent nausea and vomiting after your treatment, we encourage you to take your nausea medication: Compazine (Prochlorperazine). Take one every six hours as needed.  Continue Decadron as instructed by MD.   If you develop nausea and vomiting that is not controlled by your nausea medication, call the clinic.   BELOW ARE SYMPTOMS THAT SHOULD BE REPORTED IMMEDIATELY:  *FEVER GREATER THAN 100.5 F  *CHILLS WITH OR WITHOUT FEVER  NAUSEA AND VOMITING THAT IS NOT CONTROLLED WITH YOUR NAUSEA MEDICATION  *UNUSUAL SHORTNESS OF BREATH  *UNUSUAL BRUISING OR BLEEDING  TENDERNESS IN MOUTH AND THROAT WITH OR WITHOUT PRESENCE OF ULCERS  *URINARY PROBLEMS  *BOWEL PROBLEMS  UNUSUAL RASH Items with * indicate a potential emergency and should be followed up as soon as possible.  Feel free to call the clinic should you have any questions or concerns. The clinic phone number is [phone].  Please show the [facility] at check-in to the Emergency Department and triage nurse.

## 2014-12-27 NOTE — Telephone Encounter (Signed)
Pt confirmed labs/ov per 05/09 POF, gave pt AVS and Calendar..... KJ °

## 2015-01-03 ENCOUNTER — Other Ambulatory Visit: Payer: Medicare Other

## 2015-01-10 ENCOUNTER — Other Ambulatory Visit: Payer: Medicare Other

## 2015-01-18 ENCOUNTER — Other Ambulatory Visit (HOSPITAL_BASED_OUTPATIENT_CLINIC_OR_DEPARTMENT_OTHER): Payer: Medicaid Other

## 2015-01-18 ENCOUNTER — Encounter: Payer: Self-pay | Admitting: Oncology

## 2015-01-18 ENCOUNTER — Ambulatory Visit (HOSPITAL_BASED_OUTPATIENT_CLINIC_OR_DEPARTMENT_OTHER): Payer: Medicaid Other

## 2015-01-18 ENCOUNTER — Ambulatory Visit (HOSPITAL_BASED_OUTPATIENT_CLINIC_OR_DEPARTMENT_OTHER): Payer: Medicaid Other | Admitting: Oncology

## 2015-01-18 ENCOUNTER — Telehealth: Payer: Self-pay | Admitting: Oncology

## 2015-01-18 VITALS — BP 122/81 | HR 114 | Resp 20

## 2015-01-18 VITALS — BP 110/82 | HR 86 | Resp 18 | Ht 69.0 in | Wt 138.0 lb

## 2015-01-18 DIAGNOSIS — D6481 Anemia due to antineoplastic chemotherapy: Secondary | ICD-10-CM | POA: Diagnosis not present

## 2015-01-18 DIAGNOSIS — C3491 Malignant neoplasm of unspecified part of right bronchus or lung: Secondary | ICD-10-CM

## 2015-01-18 DIAGNOSIS — Z452 Encounter for adjustment and management of vascular access device: Secondary | ICD-10-CM

## 2015-01-18 DIAGNOSIS — C7951 Secondary malignant neoplasm of bone: Secondary | ICD-10-CM

## 2015-01-18 DIAGNOSIS — C787 Secondary malignant neoplasm of liver and intrahepatic bile duct: Secondary | ICD-10-CM

## 2015-01-18 DIAGNOSIS — R944 Abnormal results of kidney function studies: Secondary | ICD-10-CM

## 2015-01-18 DIAGNOSIS — Z5111 Encounter for antineoplastic chemotherapy: Secondary | ICD-10-CM

## 2015-01-18 DIAGNOSIS — C3411 Malignant neoplasm of upper lobe, right bronchus or lung: Secondary | ICD-10-CM

## 2015-01-18 DIAGNOSIS — C801 Malignant (primary) neoplasm, unspecified: Secondary | ICD-10-CM

## 2015-01-18 DIAGNOSIS — C3490 Malignant neoplasm of unspecified part of unspecified bronchus or lung: Secondary | ICD-10-CM

## 2015-01-18 LAB — COMPREHENSIVE METABOLIC PANEL (CC13)
ALK PHOS: 339 U/L — AB (ref 40–150)
ALT: 34 U/L (ref 0–55)
AST: 25 U/L (ref 5–34)
Albumin: 2.7 g/dL — ABNORMAL LOW (ref 3.5–5.0)
Anion Gap: 13 mEq/L — ABNORMAL HIGH (ref 3–11)
BILIRUBIN TOTAL: 0.2 mg/dL (ref 0.20–1.20)
BUN: 22.7 mg/dL (ref 7.0–26.0)
CHLORIDE: 111 meq/L — AB (ref 98–109)
CO2: 14 mEq/L — ABNORMAL LOW (ref 22–29)
CREATININE: 1.8 mg/dL — AB (ref 0.7–1.3)
Calcium: 8.7 mg/dL (ref 8.4–10.4)
EGFR: 47 mL/min/{1.73_m2} — ABNORMAL LOW (ref >90)
Glucose: 94 mg/dl (ref 70–140)
Potassium: 5.3 mEq/L — ABNORMAL HIGH (ref 3.5–5.1)
Sodium: 138 mEq/L (ref 136–145)
Total Protein: 7.9 g/dL (ref 6.4–8.3)

## 2015-01-18 LAB — CBC WITH DIFFERENTIAL/PLATELET
BASO%: 0.1 % (ref 0.0–2.0)
BASOS ABS: 0 10*3/uL (ref 0.0–0.1)
EOS%: 0.3 % (ref 0.0–7.0)
Eosinophils Absolute: 0 10*3/uL (ref 0.0–0.5)
HCT: 28.1 % — ABNORMAL LOW (ref 38.4–49.9)
HGB: 9.2 g/dL — ABNORMAL LOW (ref 13.0–17.1)
LYMPH%: 21.4 % (ref 14.0–49.0)
MCH: 28.9 pg (ref 27.2–33.4)
MCHC: 32.7 g/dL (ref 32.0–36.0)
MCV: 88.4 fL (ref 79.3–98.0)
MONO#: 0.9 10*3/uL (ref 0.1–0.9)
MONO%: 11.5 % (ref 0.0–14.0)
NEUT#: 5 10*3/uL (ref 1.5–6.5)
NEUT%: 66.7 % (ref 39.0–75.0)
PLATELETS: 342 10*3/uL (ref 140–400)
RBC: 3.18 10*6/uL — AB (ref 4.20–5.82)
RDW: 24.2 % — ABNORMAL HIGH (ref 11.0–14.6)
WBC: 7.5 10*3/uL (ref 4.0–10.3)
lymph#: 1.6 10*3/uL (ref 0.9–3.3)
nRBC: 0 % (ref 0–0)

## 2015-01-18 MED ORDER — SODIUM CHLORIDE 0.9 % IV SOLN
INTRAVENOUS | Status: AC
  Administered 2015-01-18: 11:00:00 via INTRAVENOUS

## 2015-01-18 MED ORDER — HEPARIN SOD (PORK) LOCK FLUSH 100 UNIT/ML IV SOLN
500.0000 [IU] | Freq: Once | INTRAVENOUS | Status: AC
  Administered 2015-01-18: 500 [IU] via INTRAVENOUS
  Filled 2015-01-18: qty 5

## 2015-01-18 MED ORDER — SODIUM CHLORIDE 0.9 % IJ SOLN
10.0000 mL | INTRAMUSCULAR | Status: DC | PRN
  Administered 2015-01-18: 10 mL via INTRAVENOUS
  Filled 2015-01-18: qty 10

## 2015-01-18 NOTE — Progress Notes (Signed)
[place] [phone]   [phone]  OFFICE PROGRESS NOTE  [name], [name], MD [place] [address]  DIAGNOSIS: Stage IV (T2b, N2, M1b) non-small cell lung cancer, adenocarcinoma with negative EGFR mutation and negative gene translocation presented with a large right hilar mass as well as mediastinal lymphadenopathy and metastatic disease to the liver, bone and pancreas diagnosed in February 2016.  PRIOR THERAPY: None  CURRENT THERAPY: Systemic chemotherapy with carboplatin for AUC of 5 and Alimta 500 MG/M2 every 3 weeks. Status post 4 cycles.  INTERVAL HISTORY: Francisco Love 54 y.o. male returns to the clinic today for follow-up visit accompanied by a family member. The patient tolerated the last cycle of her systemic chemotherapy with carboplatin and Alimta fairly well with no significant adverse effects except for mild fatigue secondary to chemotherapy-induced anemia. He denied having any significant fever or chills, no nausea or vomiting. The patient denied having any significant chest pain but continues to have shortness of breath. He denied having any significant cough or hemoptysis.   MEDICAL HISTORY: Past Medical History  Diagnosis Date  . Shortness of breath dyspnea     ALLERGIES:  has No Known Allergies.  MEDICATIONS:  Current Outpatient Prescriptions  Medication Sig Dispense Refill  . acetaminophen (TYLENOL) 325 MG tablet Take 2 tablets (650 mg total) by mouth every 6 (six) hours as needed for mild pain (or Fever >/= 101).    Marland Kitchen albuterol (PROVENTIL) (2.5 MG/3ML) 0.083% nebulizer solution Take 3 mLs (2.5 mg total) by nebulization every 2 (two) hours as needed for wheezing. 75 mL 12  . dexamethasone (DECADRON) 4 MG tablet Take 1 tablet (4 mg total) by mouth 2 (two) times daily with a meal. 30 tablet 0  . diphenhydrAMINE (SOMINEX) 25 MG tablet Take 25 mg by mouth as needed for itching or sleep.    . feeding  supplement, ENSURE COMPLETE, (ENSURE COMPLETE) LIQD Take 237 mLs by mouth 3 (three) times daily between meals.    . folic acid (FOLVITE) 1 MG tablet Take 1 tablet (1 mg total) by mouth daily. 30 tablet 0  . mirtazapine (REMERON) 15 MG tablet Take 1 tablet (15 mg total) by mouth at bedtime.    Marland Kitchen oxyCODONE (OXY IR/ROXICODONE) 5 MG immediate release tablet Take 1 tablet (5 mg total) by mouth every 4 (four) hours as needed for moderate pain. 20 tablet 0  . prochlorperazine (COMPAZINE) 10 MG tablet Take 1 tablet (10 mg total) by mouth every 6 (six) hours as needed for nausea or vomiting. 30 tablet 1  . senna-docusate (SENOKOT-S) 8.6-50 MG per tablet Take 1 tablet by mouth daily.     No current facility-administered medications for this visit.   Facility-Administered Medications Ordered in Other Visits  Medication Dose Route Frequency Provider Last Rate Last Dose  . sodium chloride 0.9 % injection 10 mL  10 mL Intravenous PRN Curt Bears, MD   10 mL at 01/18/15 1241    SURGICAL HISTORY:  Past Surgical History  Procedure Laterality Date  . Appendectomy    . Video bronchoscopy N/A 09/21/2014    Procedure: VIDEO BRONCHOSCOPY WITH FLUORO;  Surgeon: Rigoberto Noel, MD;  Location: [facility];  Service: Cardiopulmonary;  Laterality: N/A;  . Video bronchoscopy with endobronchial ultrasound N/A 09/23/2014    Procedure: VIDEO BRONCHOSCOPY WITH ENDOBRONCHIAL ULTRASOUND;  Surgeon: Rigoberto Noel, MD;  Location: [facility];  Service: Pulmonary;  Laterality: N/A;    REVIEW  OF SYSTEMS:  Constitutional: positive for fatigue Eyes: negative Ears, nose, mouth, throat, and face: negative Respiratory: positive for dyspnea on exertion Cardiovascular: negative Gastrointestinal: negative Genitourinary:negative Integument/breast: negative Hematologic/lymphatic: negative Musculoskeletal:negative Neurological: negative Behavioral/Psych: negative Endocrine: negative Allergic/Immunologic: negative   PHYSICAL  EXAMINATION: General appearance: alert, cooperative, fatigued and no distress Head: Normocephalic, without obvious abnormality, atraumatic Neck: no adenopathy, no JVD, supple, symmetrical, trachea midline and thyroid not enlarged, symmetric, no tenderness/mass/nodules Lymph nodes: Cervical, supraclavicular, and axillary nodes normal. Resp: clear to auscultation bilaterally Back: symmetric, no curvature. ROM normal. No CVA tenderness. Cardio: regular rate and rhythm, S1, S2 normal, no murmur, click, rub or gallop GI: soft, non-tender; bowel sounds normal; no masses,  no organomegaly Extremities: extremities normal, atraumatic, no cyanosis or edema  ECOG PERFORMANCE STATUS: 1 - Symptomatic but completely ambulatory  Blood pressure 110/82, pulse 86, resp. rate 18, height 5' 9"  (1.753 m), weight 138 lb (62.596 kg), SpO2 96 %.  LABORATORY DATA: Lab Results  Component Value Date   WBC 7.5 01/18/2015   HGB 9.2* 01/18/2015   HCT 28.1* 01/18/2015   MCV 88.4 01/18/2015   PLT 342 01/18/2015      Chemistry      Component Value Date/Time   NA 138 01/18/2015 0909   NA 135 10/25/2014 0845   K 5.3* 01/18/2015 0909   K 5.3* 10/25/2014 0845   CL 108 10/25/2014 0845   CO2 14* 01/18/2015 0909   CO2 19 10/25/2014 0845   BUN 22.7 01/18/2015 0909   BUN 22 10/25/2014 0845   CREATININE 1.8* 01/18/2015 0909   CREATININE 1.03 10/25/2014 0845      Component Value Date/Time   CALCIUM 8.7 01/18/2015 0909   CALCIUM 8.6 10/25/2014 0845   ALKPHOS 339* 01/18/2015 0909   ALKPHOS 882* 09/20/2014 1120   AST 25 01/18/2015 0909   AST 29 09/20/2014 1120   ALT 34 01/18/2015 0909   ALT 34 09/20/2014 1120   BILITOT 0.20 01/18/2015 0909   BILITOT 0.1* 09/20/2014 1120       RADIOGRAPHIC STUDIES: Ct Chest W Contrast  12/24/2014   CLINICAL DATA:  Non-small-cell lung cancer diagnosed 2/16. Chemotherapy in progress. Shortness of breath. Constipation. Bone metastasis.  EXAM: CT CHEST, ABDOMEN, AND PELVIS WITH  CONTRAST  TECHNIQUE: Multidetector CT imaging of the chest, abdomen and pelvis was performed following the standard protocol during bolus administration of intravenous contrast.  CONTRAST:  152m OMNIPAQUE IOHEXOL 300 MG/ML  SOLN  COMPARISON:  PET of 10/15/2014. Abdominal pelvic CT of 09/20/2014. Outside chest CT 09/17/2014.  FINDINGS: CT CHEST FINDINGS  Mediastinum/Nodes: No supraclavicular adenopathy. A right Port-A-Cath terminates at the cavoatrial junction versus high right atrium. Right paratracheal adenopathy at 3.3 cm on image 22. 4.2 cm on the prior exam (when remeasured).  A node within the azygoesophageal recess measures 1.5 cm on image 33 and is similar to on the prior exam.  Partially calcified right hilar node measures 1.9 cm on image 29 versus 2.2 cm on the prior exam (when remeasured).  Prevascular node measures 1.1 cm on image 25 versus 1.3 cm on the prior exam (when remeasured).  Lungs/Pleura: Small bilateral pleural effusions are similar to on the prior PET. Centrilobular and paraseptal emphysema. Probable scarring in the left upper lobe on image 20, and image 25. Similar.  Musculoskeletal: widespread sclerotic osseous metastasis. Lesions are better defined, especially within bilateral ribs. Posterior right fifth rib pathologic fracture on image 22 and more laterally on image 28. Chronic. The fourth anterior rib pathologic  fracture is either new or increased with callus deposition.  CT ABDOMEN PELVIS FINDINGS  Hepatobiliary: 6 mm right hepatic lobe cyst is unchanged. Normal gallbladder. Mild biliary ductal dilatation, 9 mm on coronal image 35. This is similar.  Pancreas: Partially calcified multi cystic lesion within the pancreatic head and uncinate process is felt to be similar, including on image 70 of series 2.  Pancreatic duct is upper normal and similar.  No acute pancreatitis  Spleen: Normal  Adrenals/Urinary Tract: Normal adrenal glands. At least partially duplicated right renal collecting  system. Otherwise normal kidneys and urinary bladder.  Stomach/Bowel: Normal stomach, without wall thickening. Colonic stool burden suggests constipation. Normal terminal ileum. Normal small bowel.  Vascular/Lymphatic: Advanced aortic and branch vessel atherosclerosis. Severe stenosis to near occlusion of left common iliac artery on image 92. Chronic.  7 mm right external iliac node on image 109 is similar and not pathologic by size criteria. No abdominopelvic adenopathy.  Reproductive: Normal prostate.  Other: No significant free fluid. Incompletely imaged left-sided hydrocele.  Musculoskeletal: Widespread osseous metastasis which are more well-defined today, sclerotic. Advanced osteoarthritis of both hips.  IMPRESSION: 1. Mild progression of thoracic nodal metastasis. 2. Increased definition of multiple sclerotic osseous metastasis. This could relate to interval healing or less likely disease progression. 3. Grossly similar appearance of an ill-defined multi cystic lesion within the pancreatic head/uncinate process. Similar mild biliary and borderline pancreatic duct dilatation. This could represent a a pseudocyst or indolent cystic neoplasm. 4. Advanced atherosclerosis with near complete occlusion of the left common iliac artery. 5.  Possible constipation. 6. Similar small bilateral pleural effusions.   Electronically Signed   By: Abigail Miyamoto M.D.   On: 12/24/2014 10:36   Ct Abdomen Pelvis W Contrast  12/24/2014   CLINICAL DATA:  Non-small-cell lung cancer diagnosed 2/16. Chemotherapy in progress. Shortness of breath. Constipation. Bone metastasis.  EXAM: CT CHEST, ABDOMEN, AND PELVIS WITH CONTRAST  TECHNIQUE: Multidetector CT imaging of the chest, abdomen and pelvis was performed following the standard protocol during bolus administration of intravenous contrast.  CONTRAST:  122m OMNIPAQUE IOHEXOL 300 MG/ML  SOLN  COMPARISON:  PET of 10/15/2014. Abdominal pelvic CT of 09/20/2014. Outside chest CT 09/17/2014.   FINDINGS: CT CHEST FINDINGS  Mediastinum/Nodes: No supraclavicular adenopathy. A right Port-A-Cath terminates at the cavoatrial junction versus high right atrium. Right paratracheal adenopathy at 3.3 cm on image 22. 4.2 cm on the prior exam (when remeasured).  A node within the azygoesophageal recess measures 1.5 cm on image 33 and is similar to on the prior exam.  Partially calcified right hilar node measures 1.9 cm on image 29 versus 2.2 cm on the prior exam (when remeasured).  Prevascular node measures 1.1 cm on image 25 versus 1.3 cm on the prior exam (when remeasured).  Lungs/Pleura: Small bilateral pleural effusions are similar to on the prior PET. Centrilobular and paraseptal emphysema. Probable scarring in the left upper lobe on image 20, and image 25. Similar.  Musculoskeletal: widespread sclerotic osseous metastasis. Lesions are better defined, especially within bilateral ribs. Posterior right fifth rib pathologic fracture on image 22 and more laterally on image 28. Chronic. The fourth anterior rib pathologic fracture is either new or increased with callus deposition.  CT ABDOMEN PELVIS FINDINGS  Hepatobiliary: 6 mm right hepatic lobe cyst is unchanged. Normal gallbladder. Mild biliary ductal dilatation, 9 mm on coronal image 35. This is similar.  Pancreas: Partially calcified multi cystic lesion within the pancreatic head and uncinate process is felt to be  similar, including on image 70 of series 2.  Pancreatic duct is upper normal and similar.  No acute pancreatitis  Spleen: Normal  Adrenals/Urinary Tract: Normal adrenal glands. At least partially duplicated right renal collecting system. Otherwise normal kidneys and urinary bladder.  Stomach/Bowel: Normal stomach, without wall thickening. Colonic stool burden suggests constipation. Normal terminal ileum. Normal small bowel.  Vascular/Lymphatic: Advanced aortic and branch vessel atherosclerosis. Severe stenosis to near occlusion of left common iliac  artery on image 92. Chronic.  7 mm right external iliac node on image 109 is similar and not pathologic by size criteria. No abdominopelvic adenopathy.  Reproductive: Normal prostate.  Other: No significant free fluid. Incompletely imaged left-sided hydrocele.  Musculoskeletal: Widespread osseous metastasis which are more well-defined today, sclerotic. Advanced osteoarthritis of both hips.  IMPRESSION: 1. Mild progression of thoracic nodal metastasis. 2. Increased definition of multiple sclerotic osseous metastasis. This could relate to interval healing or less likely disease progression. 3. Grossly similar appearance of an ill-defined multi cystic lesion within the pancreatic head/uncinate process. Similar mild biliary and borderline pancreatic duct dilatation. This could represent a a pseudocyst or indolent cystic neoplasm. 4. Advanced atherosclerosis with near complete occlusion of the left common iliac artery. 5.  Possible constipation. 6. Similar small bilateral pleural effusions.   Electronically Signed   By: Abigail Miyamoto M.D.   On: 12/24/2014 10:36    ASSESSMENT AND PLAN: This is a very pleasant 54 year old African-American male recently diagnosed with a stage IV non-small cell lung cancer, adenocarcinoma. He is currently undergoing systemic chemotherapy was carboplatin and Alimta status post 4 cycles and tolerating his treatment fairly well. The molecular study by [employer] showed no evidence for EGFR mutation or active gene translocation. The recent CT scan of the chest, abdomen and pelvis showed some improvement in his disease.   Patient was seen and examined with Dr. Julien Nordmann. Creatinine is elevated today at 1.8. We will hold his chemotherapy this week. He will receive IV fluids today. He was encouraged to increase his hydration. We will recheck his labs next week and if his creatinine comes down we will proceed with cycle 5 of his chemotherapy. I have scheduled him back for follow-up in  approximately 4 weeks which will coincide with cycle 6 of his chemotherapy assuming no further delays.  He was advised to call immediately if he has any concerning symptoms in the interval. The patient voices understanding of current disease status and treatment options and is in agreement with the current care plan.  All questions were answered. The patient knows to call the clinic with any problems, questions or concerns. We can certainly see the patient much sooner if necessary.  Mikey Bussing, DNP, AGPCNP-BC, AOCNP  ADDENDUM: Hematology/Oncology Attending: I had a face to face encounter with the patient. I recommended his care plan. This is a very pleasant 54 years old African-American male with a stage IV non-small cell lung cancer, adenocarcinoma currently undergoing systemic chemotherapy with carboplatin and Alimta status post 4 cycles. The patient history rating his treatment fairly well with no significant adverse effects but the recent blood work showed elevated serum creatinine of 1.8. I discussed the lab result with the patient today. I recommended for him to delay his treatment with chemotherapy by 1 week until improvement of his renal function. I will also arrange for the patient to receive IV hydration with normal saline today. He would come back for follow-up visit in 4 weeks for reevaluation before starting cycle #6.  The patient was advised to call immediately if he has any concerning symptoms in the interval.   Disclaimer: This note was dictated with voice recognition software. Similar sounding words can inadvertently be transcribed and may be missed upon review. Eilleen Kempf., MD 01/24/2015

## 2015-01-18 NOTE — Telephone Encounter (Signed)
Gave avs & calendar for June. °

## 2015-01-18 NOTE — Patient Instructions (Signed)

## 2015-01-19 ENCOUNTER — Telehealth: Payer: Self-pay | Admitting: Internal Medicine

## 2015-01-19 NOTE — Telephone Encounter (Signed)
Labs added wkly per 05/31 POF.... Cherylann Banas

## 2015-01-24 ENCOUNTER — Other Ambulatory Visit (HOSPITAL_BASED_OUTPATIENT_CLINIC_OR_DEPARTMENT_OTHER): Payer: Medicaid Other

## 2015-01-24 ENCOUNTER — Other Ambulatory Visit: Payer: Self-pay | Admitting: Hematology and Oncology

## 2015-01-24 ENCOUNTER — Ambulatory Visit (HOSPITAL_BASED_OUTPATIENT_CLINIC_OR_DEPARTMENT_OTHER): Payer: Medicaid Other

## 2015-01-24 VITALS — BP 125/40 | HR 110 | Temp 97.0°F | Resp 18

## 2015-01-24 DIAGNOSIS — C3491 Malignant neoplasm of unspecified part of right bronchus or lung: Secondary | ICD-10-CM

## 2015-01-24 DIAGNOSIS — Z5111 Encounter for antineoplastic chemotherapy: Secondary | ICD-10-CM | POA: Insufficient documentation

## 2015-01-24 DIAGNOSIS — C801 Malignant (primary) neoplasm, unspecified: Secondary | ICD-10-CM

## 2015-01-24 DIAGNOSIS — C787 Secondary malignant neoplasm of liver and intrahepatic bile duct: Secondary | ICD-10-CM

## 2015-01-24 DIAGNOSIS — C7951 Secondary malignant neoplasm of bone: Secondary | ICD-10-CM

## 2015-01-24 DIAGNOSIS — D6481 Anemia due to antineoplastic chemotherapy: Secondary | ICD-10-CM

## 2015-01-24 DIAGNOSIS — C3411 Malignant neoplasm of upper lobe, right bronchus or lung: Secondary | ICD-10-CM

## 2015-01-24 DIAGNOSIS — C3412 Malignant neoplasm of upper lobe, left bronchus or lung: Secondary | ICD-10-CM

## 2015-01-24 LAB — CBC WITH DIFFERENTIAL/PLATELET
BASO%: 0.4 % (ref 0.0–2.0)
Basophils Absolute: 0 10e3/uL (ref 0.0–0.1)
EOS%: 0.1 % (ref 0.0–7.0)
Eosinophils Absolute: 0 10e3/uL (ref 0.0–0.5)
HCT: 27.4 % — ABNORMAL LOW (ref 38.4–49.9)
HGB: 9.2 g/dL — ABNORMAL LOW (ref 13.0–17.1)
LYMPH%: 10.9 % — ABNORMAL LOW (ref 14.0–49.0)
MCH: 30.4 pg (ref 27.2–33.4)
MCHC: 33.5 g/dL (ref 32.0–36.0)
MCV: 90.7 fL (ref 79.3–98.0)
MONO#: 0.4 10e3/uL (ref 0.1–0.9)
MONO%: 4.2 % (ref 0.0–14.0)
NEUT#: 7.2 10e3/uL — ABNORMAL HIGH (ref 1.5–6.5)
NEUT%: 84.4 % — ABNORMAL HIGH (ref 39.0–75.0)
Platelets: 415 10e3/uL — ABNORMAL HIGH (ref 140–400)
RBC: 3.02 10e6/uL — ABNORMAL LOW (ref 4.20–5.82)
RDW: 27.5 % — ABNORMAL HIGH (ref 11.0–14.6)
WBC: 8.5 10e3/uL (ref 4.0–10.3)
lymph#: 0.9 10e3/uL (ref 0.9–3.3)

## 2015-01-24 LAB — COMPREHENSIVE METABOLIC PANEL (CC13)
ALT: 29 U/L (ref 0–55)
ANION GAP: 8 meq/L (ref 3–11)
AST: 24 U/L (ref 5–34)
Albumin: 2.6 g/dL — ABNORMAL LOW (ref 3.5–5.0)
Alkaline Phosphatase: 300 U/L — ABNORMAL HIGH (ref 40–150)
BUN: 22.6 mg/dL (ref 7.0–26.0)
CO2: 19 meq/L — AB (ref 22–29)
Calcium: 8.8 mg/dL (ref 8.4–10.4)
Chloride: 110 mEq/L — ABNORMAL HIGH (ref 98–109)
Creatinine: 1.7 mg/dL — ABNORMAL HIGH (ref 0.7–1.3)
EGFR: 51 mL/min/{1.73_m2} — ABNORMAL LOW (ref >90)
Glucose: 96 mg/dl (ref 70–140)
Potassium: 5 mEq/L (ref 3.5–5.1)
SODIUM: 137 meq/L (ref 136–145)
TOTAL PROTEIN: 7.6 g/dL (ref 6.4–8.3)
Total Bilirubin: 0.24 mg/dL (ref 0.20–1.20)

## 2015-01-24 MED ORDER — SODIUM CHLORIDE 0.9 % IV SOLN
Freq: Once | INTRAVENOUS | Status: AC
  Administered 2015-01-24: 09:00:00 via INTRAVENOUS
  Filled 2015-01-24: qty 8

## 2015-01-24 MED ORDER — SODIUM CHLORIDE 0.9 % IV SOLN
Freq: Once | INTRAVENOUS | Status: AC
  Administered 2015-01-24: 09:00:00 via INTRAVENOUS

## 2015-01-24 MED ORDER — SODIUM CHLORIDE 0.9 % IV SOLN
500.0000 mg/m2 | Freq: Once | INTRAVENOUS | Status: AC
  Administered 2015-01-24: 900 mg via INTRAVENOUS
  Filled 2015-01-24: qty 36

## 2015-01-24 MED ORDER — HEPARIN SOD (PORK) LOCK FLUSH 100 UNIT/ML IV SOLN
500.0000 [IU] | Freq: Once | INTRAVENOUS | Status: AC | PRN
  Administered 2015-01-24: 500 [IU]
  Filled 2015-01-24: qty 5

## 2015-01-24 MED ORDER — SODIUM CHLORIDE 0.9 % IV SOLN
350.0000 mg | Freq: Once | INTRAVENOUS | Status: AC
  Administered 2015-01-24: 350 mg via INTRAVENOUS
  Filled 2015-01-24: qty 35

## 2015-01-24 MED ORDER — SODIUM CHLORIDE 0.9 % IJ SOLN
10.0000 mL | INTRAMUSCULAR | Status: DC | PRN
  Administered 2015-01-24: 10 mL
  Filled 2015-01-24: qty 10

## 2015-01-24 NOTE — Patient Instructions (Signed)
[facility] Discharge Instructions for Patients Receiving Chemotherapy  Today you received the following chemotherapy agents: Alimta, Carboplatin  To help prevent nausea and vomiting after your treatment, we encourage you to take your nausea medication as prescribed by your physician.   If you develop nausea and vomiting that is not controlled by your nausea medication, call the clinic.   BELOW ARE SYMPTOMS THAT SHOULD BE REPORTED IMMEDIATELY:  *FEVER GREATER THAN 100.5 F  *CHILLS WITH OR WITHOUT FEVER  NAUSEA AND VOMITING THAT IS NOT CONTROLLED WITH YOUR NAUSEA MEDICATION  *UNUSUAL SHORTNESS OF BREATH  *UNUSUAL BRUISING OR BLEEDING  TENDERNESS IN MOUTH AND THROAT WITH OR WITHOUT PRESENCE OF ULCERS  *URINARY PROBLEMS  *BOWEL PROBLEMS  UNUSUAL RASH Items with * indicate a potential emergency and should be followed up as soon as possible.  Feel free to call the clinic you have any questions or concerns. The clinic phone number is [phone].  Please show the CHEMO ALERT CARD at check-in to the Emergency Department and triage nurse.   

## 2015-01-24 NOTE — Progress Notes (Signed)
Per Dr. Lindi Adie on-call, okay to tx with creatinine 1.7; carboplatin to be dose reduced.

## 2015-01-26 ENCOUNTER — Other Ambulatory Visit: Payer: Self-pay | Admitting: Internal Medicine

## 2015-01-31 ENCOUNTER — Telehealth: Payer: Self-pay | Admitting: Medical Oncology

## 2015-01-31 ENCOUNTER — Other Ambulatory Visit: Payer: Medicaid Other

## 2015-01-31 NOTE — Telephone Encounter (Signed)
Pt [place] for labs today . I called and left message to call back .

## 2015-02-01 ENCOUNTER — Other Ambulatory Visit: Payer: Self-pay | Admitting: Internal Medicine

## 2015-02-07 ENCOUNTER — Other Ambulatory Visit: Payer: Medicaid Other

## 2015-02-14 ENCOUNTER — Ambulatory Visit (HOSPITAL_BASED_OUTPATIENT_CLINIC_OR_DEPARTMENT_OTHER): Payer: Managed Care, Other (non HMO)

## 2015-02-14 ENCOUNTER — Other Ambulatory Visit: Payer: Self-pay | Admitting: *Deleted

## 2015-02-14 ENCOUNTER — Telehealth: Payer: Self-pay | Admitting: Internal Medicine

## 2015-02-14 ENCOUNTER — Encounter: Payer: Self-pay | Admitting: Physician Assistant

## 2015-02-14 ENCOUNTER — Ambulatory Visit (HOSPITAL_COMMUNITY)
Admission: RE | Admit: 2015-02-14 | Discharge: 2015-02-14 | Disposition: A | Discharge status: Home / Self Care | Payer: Medicare Other | Source: Ambulatory Visit | Attending: Internal Medicine | Admitting: Internal Medicine

## 2015-02-14 ENCOUNTER — Ambulatory Visit (HOSPITAL_BASED_OUTPATIENT_CLINIC_OR_DEPARTMENT_OTHER): Payer: Managed Care, Other (non HMO) | Admitting: Physician Assistant

## 2015-02-14 ENCOUNTER — Other Ambulatory Visit (HOSPITAL_BASED_OUTPATIENT_CLINIC_OR_DEPARTMENT_OTHER): Payer: Managed Care, Other (non HMO)

## 2015-02-14 VITALS — BP 96/72 | HR 118 | Temp 97.9°F | Resp 18 | Ht 69.0 in | Wt 136.2 lb

## 2015-02-14 VITALS — BP 113/70 | HR 98 | Temp 98.0°F | Resp 18

## 2015-02-14 DIAGNOSIS — C7951 Secondary malignant neoplasm of bone: Secondary | ICD-10-CM

## 2015-02-14 DIAGNOSIS — C801 Malignant (primary) neoplasm, unspecified: Secondary | ICD-10-CM

## 2015-02-14 DIAGNOSIS — D6481 Anemia due to antineoplastic chemotherapy: Secondary | ICD-10-CM

## 2015-02-14 DIAGNOSIS — Z5111 Encounter for antineoplastic chemotherapy: Secondary | ICD-10-CM

## 2015-02-14 DIAGNOSIS — C7989 Secondary malignant neoplasm of other specified sites: Secondary | ICD-10-CM

## 2015-02-14 DIAGNOSIS — C3491 Malignant neoplasm of unspecified part of right bronchus or lung: Secondary | ICD-10-CM

## 2015-02-14 DIAGNOSIS — C3411 Malignant neoplasm of upper lobe, right bronchus or lung: Secondary | ICD-10-CM

## 2015-02-14 DIAGNOSIS — E875 Hyperkalemia: Secondary | ICD-10-CM | POA: Diagnosis not present

## 2015-02-14 DIAGNOSIS — C3412 Malignant neoplasm of upper lobe, left bronchus or lung: Secondary | ICD-10-CM | POA: Diagnosis not present

## 2015-02-14 DIAGNOSIS — T451X5A Adverse effect of antineoplastic and immunosuppressive drugs, initial encounter: Secondary | ICD-10-CM

## 2015-02-14 DIAGNOSIS — C787 Secondary malignant neoplasm of liver and intrahepatic bile duct: Secondary | ICD-10-CM | POA: Diagnosis not present

## 2015-02-14 DIAGNOSIS — R944 Abnormal results of kidney function studies: Secondary | ICD-10-CM | POA: Diagnosis not present

## 2015-02-14 DIAGNOSIS — E860 Dehydration: Secondary | ICD-10-CM

## 2015-02-14 LAB — COMPREHENSIVE METABOLIC PANEL (CC13)
ALT: 52 U/L (ref 0–55)
AST: 38 U/L — AB (ref 5–34)
Albumin: 2.6 g/dL — ABNORMAL LOW (ref 3.5–5.0)
Alkaline Phosphatase: 188 U/L — ABNORMAL HIGH (ref 40–150)
Anion Gap: 8 mEq/L (ref 3–11)
BILIRUBIN TOTAL: 0.22 mg/dL (ref 0.20–1.20)
BUN: 29.2 mg/dL — ABNORMAL HIGH (ref 7.0–26.0)
CHLORIDE: 109 meq/L (ref 98–109)
CO2: 18 meq/L — AB (ref 22–29)
CREATININE: 2.5 mg/dL — AB (ref 0.7–1.3)
Calcium: 8.9 mg/dL (ref 8.4–10.4)
EGFR: 33 mL/min/{1.73_m2} — ABNORMAL LOW (ref >90)
Glucose: 123 mg/dl (ref 70–140)
Potassium: 6.1 mEq/L (ref 3.5–5.1)
SODIUM: 135 meq/L — AB (ref 136–145)
Total Protein: 7.9 g/dL (ref 6.4–8.3)

## 2015-02-14 LAB — CBC WITH DIFFERENTIAL/PLATELET
BASO%: 0.7 % (ref 0.0–2.0)
Basophils Absolute: 0 10*3/uL (ref 0.0–0.1)
EOS%: 0.5 % (ref 0.0–7.0)
Eosinophils Absolute: 0 10*3/uL (ref 0.0–0.5)
HCT: 23.8 % — ABNORMAL LOW (ref 38.4–49.9)
HEMOGLOBIN: 7.8 g/dL — AB (ref 13.0–17.1)
LYMPH#: 1.1 10*3/uL (ref 0.9–3.3)
LYMPH%: 15.7 % (ref 14.0–49.0)
MCH: 31.4 pg (ref 27.2–33.4)
MCHC: 33 g/dL (ref 32.0–36.0)
MCV: 95.1 fL (ref 79.3–98.0)
MONO#: 0.5 10*3/uL (ref 0.1–0.9)
MONO%: 7.2 % (ref 0.0–14.0)
NEUT#: 5.1 10*3/uL (ref 1.5–6.5)
NEUT%: 75.9 % — ABNORMAL HIGH (ref 39.0–75.0)
Platelets: 320 10*3/uL (ref 140–400)
RBC: 2.5 10*6/uL — ABNORMAL LOW (ref 4.20–5.82)
RDW: 24.4 % — AB (ref 11.0–14.6)
WBC: 6.8 10*3/uL (ref 4.0–10.3)

## 2015-02-14 LAB — POTASSIUM (CC13): Potassium: 5.8 mEq/L — ABNORMAL HIGH (ref 3.5–5.1)

## 2015-02-14 LAB — PREPARE RBC (CROSSMATCH)

## 2015-02-14 LAB — HOLD TUBE, BLOOD BANK

## 2015-02-14 MED ORDER — HEPARIN SOD (PORK) LOCK FLUSH 100 UNIT/ML IV SOLN
500.0000 [IU] | Freq: Once | INTRAVENOUS | Status: AC | PRN
  Administered 2015-02-14: 500 [IU]
  Filled 2015-02-14: qty 5

## 2015-02-14 MED ORDER — SODIUM CHLORIDE 0.9 % IV SOLN
Freq: Once | INTRAVENOUS | Status: AC
  Administered 2015-02-14: 11:00:00 via INTRAVENOUS
  Filled 2015-02-14: qty 8

## 2015-02-14 MED ORDER — DIPHENHYDRAMINE HCL 25 MG PO CAPS
ORAL_CAPSULE | ORAL | Status: AC
  Filled 2015-02-14: qty 1

## 2015-02-14 MED ORDER — ACETAMINOPHEN 325 MG PO TABS
ORAL_TABLET | ORAL | Status: AC
  Filled 2015-02-14: qty 2

## 2015-02-14 MED ORDER — SODIUM CHLORIDE 0.9 % IV SOLN
Freq: Once | INTRAVENOUS | Status: AC
  Administered 2015-02-14: 10:00:00 via INTRAVENOUS

## 2015-02-14 MED ORDER — DIPHENHYDRAMINE HCL 25 MG PO CAPS
25.0000 mg | ORAL_CAPSULE | Freq: Once | ORAL | Status: AC
  Administered 2015-02-14: 25 mg via ORAL

## 2015-02-14 MED ORDER — OXYCODONE HCL 5 MG PO TABS: 5.0000 mg | ORAL_TABLET | ORAL | Status: DC | PRN

## 2015-02-14 MED ORDER — CYANOCOBALAMIN 1000 MCG/ML IJ SOLN: 1000.0000 ug | Freq: Once | INTRAMUSCULAR | Status: DC

## 2015-02-14 MED ORDER — SODIUM CHLORIDE 0.9 % IV SOLN
500.0000 mL | INTRAVENOUS | Status: DC
  Administered 2015-02-14: 500 mL via INTRAVENOUS

## 2015-02-14 MED ORDER — ACETAMINOPHEN 325 MG PO TABS
650.0000 mg | ORAL_TABLET | Freq: Once | ORAL | Status: AC
  Administered 2015-02-14: 650 mg via ORAL

## 2015-02-14 MED ORDER — SODIUM CHLORIDE 0.9 % IV SOLN
276.5000 mg | Freq: Once | INTRAVENOUS | Status: AC
  Administered 2015-02-14: 280 mg via INTRAVENOUS
  Filled 2015-02-14: qty 28

## 2015-02-14 MED ORDER — SODIUM CHLORIDE 0.9 % IJ SOLN
10.0000 mL | INTRAMUSCULAR | Status: DC | PRN
  Administered 2015-02-14: 10 mL
  Filled 2015-02-14: qty 10

## 2015-02-14 MED ORDER — SODIUM CHLORIDE 0.9 % IV SOLN: 500.0000 mg/m2 | Freq: Once | INTRAVENOUS | Status: DC

## 2015-02-14 NOTE — Telephone Encounter (Signed)
Gave adn printed appt sched anda vs fo rpt for July....the patient has labs done in [place]...gave barium

## 2015-02-14 NOTE — Progress Notes (Addendum)
[name] [phone]   [phone]  OFFICE PROGRESS NOTE  Francisco Love, Francisco Heinrich, MD [place] [address]  DIAGNOSIS: Stage IV (T2b, N2, M1b) non-small cell lung cancer, adenocarcinoma with negative EGFR mutation and negative gene translocation presented with a large right hilar mass as well as mediastinal lymphadenopathy and metastatic disease to the liver, bone and pancreas diagnosed in February 2016.  PRIOR THERAPY: None  CURRENT THERAPY: Systemic chemotherapy with carboplatin for AUC of 5 and Alimta 500 MG/M2 every 3 weeks. Status post 5 cycles.  INTERVAL HISTORY: Francisco Love 54 y.o. male returns to the clinic today for follow-up visit accompanied by a family member. The patient tolerated the last cycle of her systemic chemotherapy with carboplatin and Alimta fairly well with no significant adverse effects except for mild fatigue secondary to chemotherapy-induced anemia. He denied having any significant fever or chills, no nausea or vomiting. The patient denied having any significant chest pain but continues to have shortness of breath. He denied having any significant cough or hemoptysis. He reports that his primary care physician, Dr. Jacqulyn Liner from [facility] is having an evaluated for possible prostate cancer. He was referred to Dr. Elissa Hefty in [facility] who wants to perform a biopsy on his prostate. This is scheduled for 02/24/2015 and the patient is considering whether he will keep this appointment or not. He continues to have hip pain and requests a refill for his pain medication as well as for his dexamethasone.   MEDICAL HISTORY: Past Medical History  Diagnosis Date  . Shortness of breath dyspnea     ALLERGIES:  has No Known Allergies.  MEDICATIONS:  Current Outpatient Prescriptions  Medication Sig Dispense Refill  . acetaminophen (TYLENOL) 325 MG tablet Take 2 tablets (650 mg total) by mouth every 6  (six) hours as needed for mild pain (or Fever >/= 101).    Marland Kitchen albuterol (PROVENTIL) (2.5 MG/3ML) 0.083% nebulizer solution Take 3 mLs (2.5 mg total) by nebulization every 2 (two) hours as needed for wheezing. 75 mL 12  . dexamethasone (DECADRON) 4 MG tablet Take 1 tablet (4 mg total) by mouth 2 (two) times daily with a meal. 30 tablet 0  . diphenhydrAMINE (SOMINEX) 25 MG tablet Take 25 mg by mouth as needed for itching or sleep.    . feeding supplement, ENSURE COMPLETE, (ENSURE COMPLETE) LIQD Take 237 mLs by mouth 3 (three) times daily between meals.    . folic acid (FOLVITE) 1 MG tablet Take 1 tablet (1 mg total) by mouth daily. 30 tablet 0  . mirtazapine (REMERON) 15 MG tablet Take 1 tablet (15 mg total) by mouth at bedtime.    Marland Kitchen oxyCODONE (OXY IR/ROXICODONE) 5 MG immediate release tablet Take 1 tablet (5 mg total) by mouth every 4 (four) hours as needed for moderate pain. 20 tablet 0  . prochlorperazine (COMPAZINE) 10 MG tablet Take 1 tablet (10 mg total) by mouth every 6 (six) hours as needed for nausea or vomiting. 30 tablet 1  . senna-docusate (SENOKOT-S) 8.6-50 MG per tablet Take 1 tablet by mouth daily.     No current facility-administered medications for this visit.    SURGICAL HISTORY:  Past Surgical History  Procedure Laterality Date  . Appendectomy    . Video bronchoscopy N/A 09/21/2014    Procedure: VIDEO BRONCHOSCOPY WITH FLUORO;  Surgeon: Rigoberto Noel, MD;  Location: [facility];  Service: Cardiopulmonary;  Laterality: N/A;  . Video  bronchoscopy with endobronchial ultrasound N/A 09/23/2014    Procedure: VIDEO BRONCHOSCOPY WITH ENDOBRONCHIAL ULTRASOUND;  Surgeon: Rigoberto Noel, MD;  Location: [place];  Service: Pulmonary;  Laterality: N/A;    REVIEW OF SYSTEMS:  Constitutional: positive for fatigue Eyes: negative Ears, nose, mouth, throat, and face: negative Respiratory: positive for dyspnea on exertion Cardiovascular: negative Gastrointestinal:  negative Genitourinary:negative Integument/breast: negative Hematologic/lymphatic: negative Musculoskeletal:positive for arthralgias Neurological: negative Behavioral/Psych: negative Endocrine: negative Allergic/Immunologic: negative   PHYSICAL EXAMINATION: General appearance: alert, cooperative, fatigued and no distress Head: Normocephalic, without obvious abnormality, atraumatic Neck: no adenopathy, no JVD, supple, symmetrical, trachea midline and thyroid not enlarged, symmetric, no tenderness/mass/nodules Lymph nodes: Cervical, supraclavicular, and axillary nodes normal. Resp: clear to auscultation bilaterally Back: symmetric, no curvature. ROM normal. No CVA tenderness. Cardio: regular rate and rhythm, S1, S2 normal, no murmur, click, rub or gallop GI: soft, non-tender; bowel sounds normal; no masses,  no organomegaly Extremities: extremities normal, atraumatic, no cyanosis or edema  ECOG PERFORMANCE STATUS: 1 - Symptomatic but completely ambulatory  Blood pressure 96/72, pulse 118, temperature 97.9 F (36.6 C), temperature source Oral, resp. rate 18, height $RemoveBe'5\' 9"'[id number]$  (1.753 m), weight 136 lb 3.2 oz (61.78 kg), SpO2 98 %.  LABORATORY DATA: Lab Results  Component Value Date   WBC 6.8 02/14/2015   HGB 7.8* 02/14/2015   HCT 23.8* 02/14/2015   MCV 95.1 02/14/2015   PLT 320 02/14/2015      Chemistry      Component Value Date/Time   NA 135* 02/14/2015 0747   NA 135 10/25/2014 0845   K 6.1* 02/14/2015 0747   K 5.3* 10/25/2014 0845   CL 108 10/25/2014 0845   CO2 18* 02/14/2015 0747   CO2 19 10/25/2014 0845   BUN 29.2* 02/14/2015 0747   BUN 22 10/25/2014 0845   CREATININE 2.5* 02/14/2015 0747   CREATININE 1.03 10/25/2014 0845      Component Value Date/Time   CALCIUM 8.9 02/14/2015 0747   CALCIUM 8.6 10/25/2014 0845   ALKPHOS 188* 02/14/2015 0747   ALKPHOS 882* 09/20/2014 1120   AST 38* 02/14/2015 0747   AST 29 09/20/2014 1120   ALT 52 02/14/2015 0747   ALT 34  09/20/2014 1120   BILITOT 0.22 02/14/2015 0747   BILITOT 0.1* 09/20/2014 1120       RADIOGRAPHIC STUDIES: No results found.  ASSESSMENT AND PLAN: This is a very pleasant 54 year old African-American male recently diagnosed with a stage IV non-small cell lung cancer, adenocarcinoma. He is currently undergoing systemic chemotherapy with carboplatin and Alimta status post 4 cycles and tolerating his treatment fairly well. The molecular study by [employer] showed no evidence for EGFR mutation or ALK gene translocation. The recent CT scan of the chest, abdomen and pelvis showed some improvement in his disease.   Patient was discussed with and also seen by Dr. Julien Nordmann. Creatinine is elevated today at 2.5. We will hold his Alimta this week however he will receive his carboplatin. His hemoglobin is 7.8 and I will also arrange to give him 1 unit of packed red blood cells to address this level of chemotherapy-induced anemia. He will also receive a half liter of normal saline IV fluids. He was again encouraged to increase his by mouth intake of fluids in order to stay better hydrated. He will continue with weekly labs as previously scheduled. He will follow-up in 3 weeks with a restaging CT scan of the chest, abdomen and pelvis without contrast (in deference to his renal  insufficiency) to reevaluate his disease. Patient is discussed with his primary care physician regarding the questionable prostate cancer workup. He was given a refill prescription for his pain medication and a refill for his dexamethasone was sent to his pharmacy of record via E scribe. His chemistries revealed hyperkalemia with an initial potassium of 6.1, recheck was 5.8. He did receive some IV fluids in the infusion area and a prescription for 30 g of Kayexalate was sent to his pharmacy of record via E scribe. Patient was given instructions on how to take this medication and that it may cause him to have some diarrhea. He voiced  understanding.  He was advised to call immediately if he has any concerning symptoms in the interval. The patient voices understanding of current disease status and treatment options and is in agreement with the current care plan.  All questions were answered. The patient knows to call the clinic with any problems, questions or concerns. We can certainly see the patient much sooner if necessary.  Disclaimer: This note was dictated with voice recognition software. Similar sounding words can inadvertently be transcribed and may be missed upon review.  Carlton Adam, PA-C 02/14/2015  ADDENDUM: Hematology/Oncology Attending:  I had a face to face encounter with the patient. I recommended his care plan. This is a very pleasant 54 years old African-American male with metastatic non-small cell lung cancer, adenocarcinoma status post 4 cycles systemic chemotherapy with carboplatin and Alimta and tolerating the treatment well.  he was here today to start cycle number 5 of his chemotherapy but his serum creatinine is elevated at 2.5. I recommended for the patient to proceed with his treatment was carboplatin but we will hold his current treatment with Alimta.  For the chemotherapy-induced anemia, I will arrange for the patient to receive 1 unit of PRBCs transfusion.  For the hyperkalemia, will start the patient on IV hydration with normal saline. We will also order Kayexalate for the patient to use at home.  We will continue to monitor his serum creatinine and the potassium very closely.  The patient would come back for follow-up visit in 3 weeks for reevaluation with the start of cycle number 6.  He was advised to call immediately if he has any concerning symptoms in the interval.  Disclaimer: This note was dictated with voice recognition software. Similar sounding words can inadvertently be transcribed and may be missed upon review.  Eilleen Kempf., MD 02/16/2015

## 2015-02-14 NOTE — Patient Instructions (Signed)
Increase your fluid intake to stay better hydrated Continue weekly labs as scheduled Follow-up in 3 weeks with restaging CT scan of the chest to reevaluate your disease

## 2015-02-14 NOTE — Patient Instructions (Signed)
[facility] Discharge Instructions for Patients Receiving Chemotherapy  Today you received the following chemotherapy agents Carboplatin  To help prevent nausea and vomiting after your treatment, we encourage you to take your nausea medication Compazine 10 mg every 6 hours as needed   If you develop nausea and vomiting that is not controlled by your nausea medication, call the clinic.   BELOW ARE SYMPTOMS THAT SHOULD BE REPORTED IMMEDIATELY:  *FEVER GREATER THAN 100.5 F  *CHILLS WITH OR WITHOUT FEVER  NAUSEA AND VOMITING THAT IS NOT CONTROLLED WITH YOUR NAUSEA MEDICATION  *UNUSUAL SHORTNESS OF BREATH  *UNUSUAL BRUISING OR BLEEDING  TENDERNESS IN MOUTH AND THROAT WITH OR WITHOUT PRESENCE OF ULCERS  *URINARY PROBLEMS  *BOWEL PROBLEMS  UNUSUAL RASH Items with * indicate a potential emergency and should be followed up as soon as possible.  Feel free to call the clinic you have any questions or concerns. The clinic phone number is [phone].  Please show the [facility] at check-in to the Emergency Department and triage nurse.   Blood Transfusion Information WHAT IS A BLOOD TRANSFUSION? A transfusion is the replacement of blood or some of its parts. Blood is made up of multiple cells which provide different functions.  Red blood cells carry oxygen and are used for blood loss replacement.  White blood cells fight against infection.  Platelets control bleeding.  Plasma helps clot blood.  Other blood products are available for specialized needs, such as hemophilia or other clotting disorders. BEFORE THE TRANSFUSION  Who gives blood for transfusions?   You may be able to donate blood to be used at a later date on yourself (autologous donation).  Relatives can be asked to donate blood. This is generally not any safer than if you have received blood from a stranger. The same precautions are taken to ensure safety when a relative's blood is  donated.  Healthy volunteers who are fully evaluated to make sure their blood is safe. This is blood bank blood. Transfusion therapy is the safest it has ever been in the practice of medicine. Before blood is taken from a donor, a complete history is taken to make sure that person has no history of diseases nor engages in risky social behavior (examples are intravenous drug use or sexual activity with multiple partners). The donor's travel history is screened to minimize risk of transmitting infections, such as malaria. The donated blood is tested for signs of infectious diseases, such as HIV and hepatitis. The blood is then tested to be sure it is compatible with you in order to minimize the chance of a transfusion reaction. If you or a relative donates blood, this is often done in anticipation of surgery and is not appropriate for emergency situations. It takes many days to process the donated blood. RISKS AND COMPLICATIONS Although transfusion therapy is very safe and saves many lives, the main dangers of transfusion include:   Getting an infectious disease.  Developing a transfusion reaction. This is an allergic reaction to something in the blood you were given. Every precaution is taken to prevent this. The decision to have a blood transfusion has been considered carefully by your caregiver before blood is given. Blood is not given unless the benefits outweigh the risks. AFTER THE TRANSFUSION  Right after receiving a blood transfusion, you will usually feel much better and more energetic. This is especially true if your red blood cells have gotten low (anemic). The transfusion raises the level of the red  blood cells which carry oxygen, and this usually causes an energy increase.  The nurse administering the transfusion will monitor you carefully for complications. HOME CARE INSTRUCTIONS  No special instructions are needed after a transfusion. You may find your energy is better. Speak with your  caregiver about any limitations on activity for underlying diseases you may have. SEEK MEDICAL CARE IF:   Your condition is not improving after your transfusion.  You develop redness or irritation at the intravenous (IV) site. SEEK IMMEDIATE MEDICAL CARE IF:  Any of the following symptoms occur over the next 12 hours:  Shaking chills.  You have a temperature by mouth above 102 F (38.9 C), not controlled by medicine.  Chest, back, or muscle pain.  People around you feel you are not acting correctly or are confused.  Shortness of breath or difficulty breathing.  Dizziness and fainting.  You get a rash or develop hives.  You have a decrease in urine output.  Your urine turns a dark color or changes to pink, red, or brown. Any of the following symptoms occur over the next 10 days:  You have a temperature by mouth above 102 F (38.9 C), not controlled by medicine.  Shortness of breath.  Weakness after normal activity.  The white part of the eye turns yellow (jaundice).  You have a decrease in the amount of urine or are urinating less often.  Your urine turns a dark color or changes to pink, red, or brown. Document Released: 08/03/2000 Document Revised: 10/29/2011 Document Reviewed: 03/22/2008 [facility] Patient Information 2015 [place], [place]. This information is not intended to replace advice given to you by your health care provider. Make sure you discuss any questions you have with your health care provider.

## 2015-02-15 ENCOUNTER — Other Ambulatory Visit: Payer: Self-pay | Admitting: Medical Oncology

## 2015-02-15 DIAGNOSIS — E875 Hyperkalemia: Secondary | ICD-10-CM

## 2015-02-15 LAB — TYPE AND SCREEN
ABO/RH(D): A POS
ANTIBODY SCREEN: NEGATIVE
UNIT DIVISION: 0

## 2015-02-15 MED ORDER — SODIUM POLYSTYRENE SULFONATE 15 GM/60ML PO SUSP: 15.0000 g | Freq: Once | ORAL | Status: DC

## 2015-02-16 ENCOUNTER — Telehealth: Payer: Self-pay

## 2015-02-16 MED ORDER — DEXAMETHASONE 4 MG PO TABS: 4.0000 mg | ORAL_TABLET | Freq: Two times a day (BID) | ORAL | Status: DC

## 2015-02-16 MED ORDER — SODIUM POLYSTYRENE SULFONATE 15 GM/60ML PO SUSP: 30.0000 g | Freq: Once | ORAL | Status: DC

## 2015-02-16 NOTE — Telephone Encounter (Signed)
Brother in law called asking for dexamethasone to be called to [employer]. Per Adrena's note this was done. Asked brother in law to have pt sign ROI for [place] to talk to him.

## 2015-02-28 ENCOUNTER — Telehealth: Payer: Self-pay | Admitting: Internal Medicine

## 2015-02-28 NOTE — Telephone Encounter (Signed)
s.w. pt and advised that i was transferring him to ct.Marland Kitchen..i will call him to r/s lab and MD visit around ct and what they have available

## 2015-03-04 ENCOUNTER — Other Ambulatory Visit: Payer: Medicare Other

## 2015-03-04 ENCOUNTER — Ambulatory Visit (HOSPITAL_COMMUNITY): Payer: Medicare Other

## 2015-03-07 ENCOUNTER — Ambulatory Visit: Payer: Medicare Other

## 2015-03-07 ENCOUNTER — Other Ambulatory Visit: Payer: Self-pay | Admitting: Medical Oncology

## 2015-03-07 ENCOUNTER — Ambulatory Visit: Payer: Medicare Other | Admitting: Physician Assistant

## 2015-03-07 DIAGNOSIS — C7951 Secondary malignant neoplasm of bone: Secondary | ICD-10-CM

## 2015-03-07 DIAGNOSIS — C801 Malignant (primary) neoplasm, unspecified: Principal | ICD-10-CM

## 2015-03-11 ENCOUNTER — Ambulatory Visit (HOSPITAL_COMMUNITY)
Admission: RE | Admit: 2015-03-11 | Discharge: 2015-03-11 | Disposition: A | Discharge status: Home / Self Care | Payer: Medicare Other | Source: Ambulatory Visit | Attending: Physician Assistant | Admitting: Physician Assistant

## 2015-03-11 ENCOUNTER — Encounter (HOSPITAL_COMMUNITY): Payer: Self-pay

## 2015-03-11 DIAGNOSIS — I700 Atherosclerosis of aorta: Secondary | ICD-10-CM | POA: Diagnosis not present

## 2015-03-11 DIAGNOSIS — C801 Malignant (primary) neoplasm, unspecified: Secondary | ICD-10-CM

## 2015-03-11 DIAGNOSIS — E860 Dehydration: Secondary | ICD-10-CM

## 2015-03-11 DIAGNOSIS — C7951 Secondary malignant neoplasm of bone: Secondary | ICD-10-CM | POA: Insufficient documentation

## 2015-03-11 DIAGNOSIS — C3490 Malignant neoplasm of unspecified part of unspecified bronchus or lung: Secondary | ICD-10-CM | POA: Diagnosis not present

## 2015-03-11 DIAGNOSIS — C3491 Malignant neoplasm of unspecified part of right bronchus or lung: Secondary | ICD-10-CM

## 2015-03-11 DIAGNOSIS — T451X5A Adverse effect of antineoplastic and immunosuppressive drugs, initial encounter: Secondary | ICD-10-CM

## 2015-03-11 DIAGNOSIS — D6481 Anemia due to antineoplastic chemotherapy: Secondary | ICD-10-CM

## 2015-03-11 DIAGNOSIS — E875 Hyperkalemia: Secondary | ICD-10-CM

## 2015-03-22 ENCOUNTER — Telehealth: Payer: Self-pay | Admitting: Medical Oncology

## 2015-03-22 ENCOUNTER — Telehealth: Payer: Self-pay | Admitting: Internal Medicine

## 2015-03-22 NOTE — Telephone Encounter (Signed)
Pt needs f/u appt from CT scan. Onc tx request sent.

## 2015-03-22 NOTE — Telephone Encounter (Signed)
lvm for pt regarding to Aug appt... °

## 2015-04-06 ENCOUNTER — Ambulatory Visit (HOSPITAL_BASED_OUTPATIENT_CLINIC_OR_DEPARTMENT_OTHER): Payer: Managed Care, Other (non HMO) | Admitting: Physician Assistant

## 2015-04-06 ENCOUNTER — Encounter: Payer: Self-pay | Admitting: Physician Assistant

## 2015-04-06 ENCOUNTER — Telehealth: Payer: Self-pay | Admitting: Physician Assistant

## 2015-04-06 VITALS — BP 113/74 | HR 103 | Temp 97.4°F | Resp 18 | Ht 69.0 in | Wt 136.2 lb

## 2015-04-06 DIAGNOSIS — C7951 Secondary malignant neoplasm of bone: Secondary | ICD-10-CM

## 2015-04-06 DIAGNOSIS — C7989 Secondary malignant neoplasm of other specified sites: Secondary | ICD-10-CM | POA: Diagnosis not present

## 2015-04-06 DIAGNOSIS — C3411 Malignant neoplasm of upper lobe, right bronchus or lung: Secondary | ICD-10-CM | POA: Diagnosis not present

## 2015-04-06 DIAGNOSIS — Z79899 Other long term (current) drug therapy: Secondary | ICD-10-CM

## 2015-04-06 DIAGNOSIS — C787 Secondary malignant neoplasm of liver and intrahepatic bile duct: Secondary | ICD-10-CM | POA: Diagnosis not present

## 2015-04-06 DIAGNOSIS — D6481 Anemia due to antineoplastic chemotherapy: Secondary | ICD-10-CM

## 2015-04-06 DIAGNOSIS — C3491 Malignant neoplasm of unspecified part of right bronchus or lung: Secondary | ICD-10-CM

## 2015-04-06 MED ORDER — OXYCODONE-ACETAMINOPHEN 5-325 MG PO TABS
1.0000 | ORAL_TABLET | ORAL | Status: DC | PRN
Start: 2015-04-06 — End: 2015-04-13

## 2015-04-06 NOTE — Progress Notes (Addendum)
[facility] [phone]   [phone]  OFFICE PROGRESS NOTE  [name], [name], MD [place] [address]  DIAGNOSIS: Stage IV (T2b, N2, M1b) non-small cell lung cancer, adenocarcinoma with negative EGFR mutation and negative gene translocation presented with a large right hilar mass as well as mediastinal lymphadenopathy and metastatic disease to the liver, bone and pancreas diagnosed in February 2016.  PRIOR THERAPY: None  CURRENT THERAPY: Systemic chemotherapy with carboplatin for AUC of 5 and Alimta 500 MG/M2 every 3 weeks. Status post 6 cycles.  INTERVAL HISTORY: Francisco Love 54 y.o. male returns to the clinic today for follow-up visit accompanied by a family member. The patient tolerated the last cycle of her systemic chemotherapy with carboplatin and Alimta fairly well with no significant adverse effects except for mild fatigue secondary to chemotherapy-induced anemia. He denied having any significant fever or chills, no nausea or vomiting. The patient denied having any significant chest pain but continues to have shortness of breath. He denied having any significant cough or hemoptysis. He reports that he did not follow through with the recommendation to get a biopsy of is prostate. He complains of generalized pain and requests a prescription for Percocet. He recently had a restaging CT scan and presents to discuss the results.  MEDICAL HISTORY: Past Medical History  Diagnosis Date  . Shortness of breath dyspnea     ALLERGIES:  has No Known Allergies.  MEDICATIONS:  Current Outpatient Prescriptions  Medication Sig Dispense Refill  . acetaminophen (TYLENOL) 325 MG tablet Take 2 tablets (650 mg total) by mouth every 6 (six) hours as needed for mild pain (or Fever >/= 101).    Marland Kitchen albuterol (PROVENTIL) (2.5 MG/3ML) 0.083% nebulizer solution Take 3 mLs (2.5 mg total) by nebulization every 2 (two) hours as needed for  wheezing. 75 mL 12  . dexamethasone (DECADRON) 4 MG tablet Take 1 tablet (4 mg total) by mouth 2 (two) times daily with a meal. 30 tablet 0  . diphenhydrAMINE (SOMINEX) 25 MG tablet Take 25 mg by mouth as needed for itching or sleep.    . feeding supplement, ENSURE COMPLETE, (ENSURE COMPLETE) LIQD Take 237 mLs by mouth 3 (three) times daily between meals.    . folic acid (FOLVITE) 1 MG tablet Take 1 tablet (1 mg total) by mouth daily. 30 tablet 0  . mirtazapine (REMERON) 15 MG tablet Take 1 tablet (15 mg total) by mouth at bedtime.    Marland Kitchen oxyCODONE (OXY IR/ROXICODONE) 5 MG immediate release tablet Take 1 tablet (5 mg total) by mouth every 4 (four) hours as needed for moderate pain. 20 tablet 0  . oxyCODONE-acetaminophen (PERCOCET/ROXICET) 5-325 MG per tablet Take 1 tablet by mouth every 4 (four) hours as needed for severe pain. 30 tablet 0  . prochlorperazine (COMPAZINE) 10 MG tablet Take 1 tablet (10 mg total) by mouth every 6 (six) hours as needed for nausea or vomiting. 30 tablet 1  . senna-docusate (SENOKOT-S) 8.6-50 MG per tablet Take 1 tablet by mouth daily.    . sodium polystyrene (KAYEXALATE) 15 GM/60ML suspension Take 120 mLs (30 g total) by mouth once. 120 mL 0  . sodium polystyrene (KAYEXALATE) 15 GM/60ML suspension Take 60 mLs (15 g total) by mouth once. 60 mL 0   No current facility-administered medications for this visit.    SURGICAL HISTORY:  Past Surgical History  Procedure Laterality Date  . Appendectomy    . Video bronchoscopy  N/A 09/21/2014    Procedure: VIDEO BRONCHOSCOPY WITH FLUORO;  Surgeon: Rigoberto Noel, MD;  Location: [facility];  Service: Cardiopulmonary;  Laterality: N/A;  . Video bronchoscopy with endobronchial ultrasound N/A 09/23/2014    Procedure: VIDEO BRONCHOSCOPY WITH ENDOBRONCHIAL ULTRASOUND;  Surgeon: Rigoberto Noel, MD;  Location: [facility];  Service: Pulmonary;  Laterality: N/A;    REVIEW OF SYSTEMS:  Constitutional: positive for fatigue Eyes: negative Ears,  nose, mouth, throat, and face: negative Respiratory: positive for dyspnea on exertion Cardiovascular: negative Gastrointestinal: negative Genitourinary:negative Integument/breast: negative Hematologic/lymphatic: negative Musculoskeletal:positive for arthralgias Neurological: negative Behavioral/Psych: negative Endocrine: negative Allergic/Immunologic: negative   PHYSICAL EXAMINATION: General appearance: alert, cooperative, fatigued and no distress Head: Normocephalic, without obvious abnormality, atraumatic Neck: no adenopathy, no JVD, supple, symmetrical, trachea midline and thyroid not enlarged, symmetric, no tenderness/mass/nodules Lymph nodes: Cervical, supraclavicular, and axillary nodes normal. Resp: clear to auscultation bilaterally Back: symmetric, no curvature. ROM normal. No CVA tenderness. Cardio: regular rate and rhythm, S1, S2 normal, no murmur, click, rub or gallop GI: soft, non-tender; bowel sounds normal; no masses,  no organomegaly Extremities: extremities normal, atraumatic, no cyanosis or edema  ECOG PERFORMANCE STATUS: 1 - Symptomatic but completely ambulatory  Blood pressure 113/74, pulse 103, temperature 97.4 F (36.3 C), temperature source Oral, resp. rate 18, height _0  (1.753 m), weight 136 lb 3.2 oz (61.78 kg), SpO2 100 %.  LABORATORY DATA: Lab Results  Component Value Date   WBC 6.8 02/14/2015   HGB 7.8* 02/14/2015   HCT 23.8* 02/14/2015   MCV 95.1 02/14/2015   PLT 320 02/14/2015      Chemistry      Component Value Date/Time   NA 135* 02/14/2015 0747   NA 135 10/25/2014 0845   K 5.8* 02/14/2015 0929   K 5.3* 10/25/2014 0845   CL 108 10/25/2014 0845   CO2 18* 02/14/2015 0747   CO2 19 10/25/2014 0845   BUN 29.2* 02/14/2015 0747   BUN 22 10/25/2014 0845   CREATININE 2.5* 02/14/2015 0747   CREATININE 1.03 10/25/2014 0845      Component Value Date/Time   CALCIUM 8.9 02/14/2015 0747   CALCIUM 8.6 10/25/2014 0845   ALKPHOS 188* 02/14/2015  0747   ALKPHOS 882* 09/20/2014 1120   AST 38* 02/14/2015 0747   AST 29 09/20/2014 1120   ALT 52 02/14/2015 0747   ALT 34 09/20/2014 1120   BILITOT 0.22 02/14/2015 0747   BILITOT 0.1* 09/20/2014 1120       RADIOGRAPHIC STUDIES: Ct Abdomen Pelvis Wo Contrast  03/11/2015   CLINICAL DATA:  Subsequent encounter for non-small-cell lung cancer osseous metastases.  EXAM: CT CHEST, ABDOMEN AND PELVIS WITHOUT CONTRAST  TECHNIQUE: Multidetector CT imaging of the chest, abdomen and pelvis was performed following the standard protocol without IV contrast.  COMPARISON:  12/24/2014.  FINDINGS: CT CHEST FINDINGS  Mediastinum/Nodes: There is no axillary lymphadenopathy. Previously measured right paratracheal lymphadenopathy is progressed, measuring 4.2 cm in short axis today compared to 3.3 cm previously. The index lymph node in the as ago esophageal recess is not substantially changed, measuring 1.4 cm today compared to 1.5 cm previously. The previously measured partially calcified right hilar lymph node cannot be reliably measured today given the lack of intravenous contrast material. 11 mm index prevascular lymph node measured on the previous study is now 12 mm in short axis.  Lungs/Pleura: Changes of paraseptal emphysema most prominent in the upper lobes. No pulmonary edema or focal airspace consolidation. Architectural distortion in the left upper  lobe is probably related to scarring and is unchanged. No evidence for pulmonary edema or pleural effusion.  Musculoskeletal: Widespread sclerotic metastases are seen in the ribs and thoracic spine. There is involvement of the sternum and both scapulae as well. Round sclerotic lesion in the right aspect of the T10 vertebral body measures 2.0 cm today compared to 1.9 cm previously.  CT ABDOMEN AND PELVIS FINDINGS  Hepatobiliary: No focal abnormality in the liver on this study without intravenous contrast. No evidence for hepatomegaly. There is no evidence for gallstones,  gallbladder wall thickening, or pericholecystic fluid.  Pancreas: The calcified cystic lesion in the head of the pancreas, extending into the uncinate process is unchanged when taking into account the lack of intravenous contrast on today's study. There is mild prominence of the main pancreatic duct diffusely, stable.  Spleen: No splenomegaly. No focal mass lesion.  Adrenals/Urinary Tract: No adrenal nodule or mass. Right kidney is unremarkable. Left kidney is slightly diminutive but stable in appearance. No evidence for hydroureter. Urinary bladder is normal in appearance.  Stomach/Bowel: Stomach is nondistended. No gastric wall thickening. No evidence of outlet obstruction. Duodenum is normally positioned as is the ligament of Treitz. No small bowel wall thickening. No small bowel dilatation. Terminal ileum is normal. The appendix is not visualized, but there is no edema or inflammation in the region of the cecum. No gross colonic mass. No colonic wall thickening. No substantial diverticular change.  Vascular/Lymphatic: There is abdominal aortic atherosclerosis without aneurysm. Ill definition of the left common iliac artery seen to represent sequelae of severe chronic stenosis with near occlusion on previous study. No evidence for lymphadenopathy in the abdomen or pelvis.  Reproductive: Prostate gland is normal. Seminal vesicles have normal imaging features.  Other: No intraperitoneal free fluid.  Musculoskeletal: Widespread bony metastases again noted. Degenerative changes in both hips are advanced.  IMPRESSION: 1. Interval progression of the dominant nodal disease in the paratracheal mediastinum. 2. Persistent widespread bony metastatic disease without substantial change. 3. Calcified cystic lesion in the head/uncinate process of the pancreas is not substantially changed in the interval given the lack of intravenous contrast on today's study. 4. Abdominal aortic atherosclerosis. Ill definition of the left  common iliac artery seen to represent near complete occlusion on previous imaging studies.   Electronically Signed   By: Misty Stanley M.D.   On: 03/11/2015 11:53   Ct Chest Wo Contrast  03/11/2015   CLINICAL DATA:  Subsequent encounter for non-small-cell lung cancer osseous metastases.  EXAM: CT CHEST, ABDOMEN AND PELVIS WITHOUT CONTRAST  TECHNIQUE: Multidetector CT imaging of the chest, abdomen and pelvis was performed following the standard protocol without IV contrast.  COMPARISON:  12/24/2014.  FINDINGS: CT CHEST FINDINGS  Mediastinum/Nodes: There is no axillary lymphadenopathy. Previously measured right paratracheal lymphadenopathy is progressed, measuring 4.2 cm in short axis today compared to 3.3 cm previously. The index lymph node in the as ago esophageal recess is not substantially changed, measuring 1.4 cm today compared to 1.5 cm previously. The previously measured partially calcified right hilar lymph node cannot be reliably measured today given the lack of intravenous contrast material. 11 mm index prevascular lymph node measured on the previous study is now 12 mm in short axis.  Lungs/Pleura: Changes of paraseptal emphysema most prominent in the upper lobes. No pulmonary edema or focal airspace consolidation. Architectural distortion in the left upper lobe is probably related to scarring and is unchanged. No evidence for pulmonary edema or pleural effusion.  Musculoskeletal: Widespread  sclerotic metastases are seen in the ribs and thoracic spine. There is involvement of the sternum and both scapulae as well. Round sclerotic lesion in the right aspect of the T10 vertebral body measures 2.0 cm today compared to 1.9 cm previously.  CT ABDOMEN AND PELVIS FINDINGS  Hepatobiliary: No focal abnormality in the liver on this study without intravenous contrast. No evidence for hepatomegaly. There is no evidence for gallstones, gallbladder wall thickening, or pericholecystic fluid.  Pancreas: The calcified  cystic lesion in the head of the pancreas, extending into the uncinate process is unchanged when taking into account the lack of intravenous contrast on today's study. There is mild prominence of the main pancreatic duct diffusely, stable.  Spleen: No splenomegaly. No focal mass lesion.  Adrenals/Urinary Tract: No adrenal nodule or mass. Right kidney is unremarkable. Left kidney is slightly diminutive but stable in appearance. No evidence for hydroureter. Urinary bladder is normal in appearance.  Stomach/Bowel: Stomach is nondistended. No gastric wall thickening. No evidence of outlet obstruction. Duodenum is normally positioned as is the ligament of Treitz. No small bowel wall thickening. No small bowel dilatation. Terminal ileum is normal. The appendix is not visualized, but there is no edema or inflammation in the region of the cecum. No gross colonic mass. No colonic wall thickening. No substantial diverticular change.  Vascular/Lymphatic: There is abdominal aortic atherosclerosis without aneurysm. Ill definition of the left common iliac artery seen to represent sequelae of severe chronic stenosis with near occlusion on previous study. No evidence for lymphadenopathy in the abdomen or pelvis.  Reproductive: Prostate gland is normal. Seminal vesicles have normal imaging features.  Other: No intraperitoneal free fluid.  Musculoskeletal: Widespread bony metastases again noted. Degenerative changes in both hips are advanced.  IMPRESSION: 1. Interval progression of the dominant nodal disease in the paratracheal mediastinum. 2. Persistent widespread bony metastatic disease without substantial change. 3. Calcified cystic lesion in the head/uncinate process of the pancreas is not substantially changed in the interval given the lack of intravenous contrast on today's study. 4. Abdominal aortic atherosclerosis. Ill definition of the left common iliac artery seen to represent near complete occlusion on previous imaging  studies.   Electronically Signed   By: Misty Stanley M.D.   On: 03/11/2015 11:53    ASSESSMENT AND PLAN: This is a very pleasant 54 year old African-American male recently diagnosed with a stage IV non-small cell lung cancer, adenocarcinoma. He is currently undergoing systemic chemotherapy with carboplatin and Alimta status post 4 cycles and tolerating his treatment fairly well. The molecular study by [employer] showed no evidence for EGFR mutation or ALK gene translocation. The recent CT scan of the chest, abdomen and pelvis showed some improvement in his disease.   Patient was discussed with and also seen by Dr. Julien Nordmann. His recent restaging CT scan showed evidence for disease progression.the results of the CT scan were discussed with the patient and his brother. He was given the option of a hospice referral versus immunotherapy with nivolumab.the patient might to try immunotherapy. He is aware of the side effects of this treatment may include but are not limited to inflammation of the lungs, skin, gastrointestinal tract and hormone abnormalities such as thyroid. He wishes to proceed with immunotherapy. Patient will return in one week for cycle #1 of nivolumab with CBC differential, C met and TSH and return in 3 weeks for symptom management visit prior to the start of cycle #2.  He was advised to call immediately if he has  any concerning symptoms in the interval. The patient voices understanding of current disease status and treatment options and is in agreement with the current care plan.  All questions were answered. The patient knows to call the clinic with any problems, questions or concerns. We can certainly see the patient much sooner if necessary.  Disclaimer: This note was dictated with voice recognition software. Similar sounding words can inadvertently be transcribed and may be missed upon review.  Carlton Adam, PA-C 04/06/2015  ADDENDUM: Hematology/Oncology Attending: I had a  face to face encounter with the patient. I recommended his care plan. This is a very pleasant 54 years old African-American male with metastatic non-small cell lung cancer, adenocarcinoma status post 6 cycles of systemic chemotherapy with carboplatin and Alimta with initial response followed by disease progression after cycle #6. The recent CT scan of the chest, abdomen and pelvis showed evidence for disease progression. I discussed the scan results with the patient and his family member. I gave him the option of palliative care and hospice referral versus consideration of second line treatment with immunotherapy with Nivolumab 3 MG/KG every 2 weeks. The patient is interested in proceeding with immunotherapy. I discussed with him the adverse effect of this treatment including but not limited to immune mediated pneumonitis, hepatitis, nephritis, thyroid or other endocrine dysfunction. He is expected to start the first cycle of this treatment next week. The patient would come back for follow-up visit in 3 weeks for reevaluation before starting cycle #2. He was given a refill of his pain medication.  The patient was advised to call immediately if he has any concerning symptoms in the interval.  Disclaimer: This note was dictated with voice recognition software. Similar sounding words can inadvertently be transcribed and may be missed upon review. Eilleen Kempf., MD 04/12/2015

## 2015-04-06 NOTE — Telephone Encounter (Signed)
Gave avs & calendar for August/September °

## 2015-04-12 NOTE — Patient Instructions (Signed)
Your CT scan revealed evidence for disease progression. You have opted to proceed with immunotherapy. Follow up in one week to start immunotherapy and again in 3 weeks for symptom management visit prior to cycle #2 of your immunotherapy

## 2015-04-13 ENCOUNTER — Ambulatory Visit (HOSPITAL_BASED_OUTPATIENT_CLINIC_OR_DEPARTMENT_OTHER): Payer: Managed Care, Other (non HMO)

## 2015-04-13 ENCOUNTER — Other Ambulatory Visit: Payer: Self-pay | Admitting: Medical Oncology

## 2015-04-13 ENCOUNTER — Other Ambulatory Visit (HOSPITAL_BASED_OUTPATIENT_CLINIC_OR_DEPARTMENT_OTHER): Payer: Managed Care, Other (non HMO)

## 2015-04-13 VITALS — BP 105/74 | HR 102 | Temp 98.4°F | Resp 18

## 2015-04-13 DIAGNOSIS — D6481 Anemia due to antineoplastic chemotherapy: Secondary | ICD-10-CM

## 2015-04-13 DIAGNOSIS — C787 Secondary malignant neoplasm of liver and intrahepatic bile duct: Secondary | ICD-10-CM

## 2015-04-13 DIAGNOSIS — Z95828 Presence of other vascular implants and grafts: Secondary | ICD-10-CM

## 2015-04-13 DIAGNOSIS — Z5112 Encounter for antineoplastic immunotherapy: Secondary | ICD-10-CM | POA: Diagnosis not present

## 2015-04-13 DIAGNOSIS — C7951 Secondary malignant neoplasm of bone: Secondary | ICD-10-CM

## 2015-04-13 DIAGNOSIS — C3411 Malignant neoplasm of upper lobe, right bronchus or lung: Secondary | ICD-10-CM

## 2015-04-13 DIAGNOSIS — Z79899 Other long term (current) drug therapy: Secondary | ICD-10-CM | POA: Diagnosis not present

## 2015-04-13 DIAGNOSIS — E875 Hyperkalemia: Secondary | ICD-10-CM | POA: Diagnosis not present

## 2015-04-13 DIAGNOSIS — C3491 Malignant neoplasm of unspecified part of right bronchus or lung: Secondary | ICD-10-CM

## 2015-04-13 DIAGNOSIS — C801 Malignant (primary) neoplasm, unspecified: Principal | ICD-10-CM

## 2015-04-13 LAB — CBC WITH DIFFERENTIAL/PLATELET
BASO%: 0.3 % (ref 0.0–2.0)
BASOS ABS: 0 10*3/uL (ref 0.0–0.1)
EOS ABS: 0.2 10*3/uL (ref 0.0–0.5)
EOS%: 2.1 % (ref 0.0–7.0)
HEMATOCRIT: 27.8 % — AB (ref 38.4–49.9)
HGB: 9 g/dL — ABNORMAL LOW (ref 13.0–17.1)
LYMPH%: 24.6 % (ref 14.0–49.0)
MCH: 30.2 pg (ref 27.2–33.4)
MCHC: 32.4 g/dL (ref 32.0–36.0)
MCV: 93.3 fL (ref 79.3–98.0)
MONO#: 0.7 10*3/uL (ref 0.1–0.9)
MONO%: 8.2 % (ref 0.0–14.0)
NEUT#: 5.8 10*3/uL (ref 1.5–6.5)
NEUT%: 64.8 % (ref 39.0–75.0)
Platelets: 308 10*3/uL (ref 140–400)
RBC: 2.98 10*6/uL — ABNORMAL LOW (ref 4.20–5.82)
RDW: 16.9 % — ABNORMAL HIGH (ref 11.0–14.6)
WBC: 8.9 10*3/uL (ref 4.0–10.3)
lymph#: 2.2 10*3/uL (ref 0.9–3.3)

## 2015-04-13 LAB — TSH CHCC: TSH: 2.955 m[IU]/L (ref 0.320–4.118)

## 2015-04-13 LAB — COMPREHENSIVE METABOLIC PANEL (CC13)
ALBUMIN: 2.8 g/dL — AB (ref 3.5–5.0)
ALK PHOS: 169 U/L — AB (ref 40–150)
ALT: 61 U/L — ABNORMAL HIGH (ref 0–55)
ANION GAP: 11 meq/L (ref 3–11)
AST: 39 U/L — ABNORMAL HIGH (ref 5–34)
BUN: 37.5 mg/dL — ABNORMAL HIGH (ref 7.0–26.0)
CO2: 15 mEq/L — ABNORMAL LOW (ref 22–29)
Calcium: 8.8 mg/dL (ref 8.4–10.4)
Chloride: 110 mEq/L — ABNORMAL HIGH (ref 98–109)
Creatinine: 2.9 mg/dL — ABNORMAL HIGH (ref 0.7–1.3)
EGFR: 27 mL/min/{1.73_m2} — AB (ref >90)
Glucose: 101 mg/dl (ref 70–140)
Potassium: 5.2 mEq/L — ABNORMAL HIGH (ref 3.5–5.1)
Sodium: 136 mEq/L (ref 136–145)
TOTAL PROTEIN: 7.8 g/dL (ref 6.4–8.3)

## 2015-04-13 MED ORDER — LIDOCAINE-PRILOCAINE 2.5-2.5 % EX CREA: 1.0000 "application " | TOPICAL_CREAM | CUTANEOUS | Status: DC | PRN

## 2015-04-13 MED ORDER — SODIUM CHLORIDE 0.9 % IJ SOLN
10.0000 mL | INTRAMUSCULAR | Status: DC | PRN
  Administered 2015-04-13: 10 mL
  Filled 2015-04-13: qty 10

## 2015-04-13 MED ORDER — OXYCODONE-ACETAMINOPHEN 5-325 MG PO TABS
ORAL_TABLET | ORAL | Status: AC
  Filled 2015-04-13: qty 1

## 2015-04-13 MED ORDER — HEPARIN SOD (PORK) LOCK FLUSH 100 UNIT/ML IV SOLN
500.0000 [IU] | Freq: Once | INTRAVENOUS | Status: AC | PRN
  Administered 2015-04-13: 500 [IU]
  Filled 2015-04-13: qty 5

## 2015-04-13 MED ORDER — OXYCODONE-ACETAMINOPHEN 5-325 MG PO TABS
1.0000 | ORAL_TABLET | ORAL | Status: AC
  Administered 2015-04-13: 1 via ORAL

## 2015-04-13 MED ORDER — SODIUM CHLORIDE 0.9 % IV SOLN
Freq: Once | INTRAVENOUS | Status: AC
  Administered 2015-04-13: 10:00:00 via INTRAVENOUS

## 2015-04-13 MED ORDER — SODIUM CHLORIDE 0.9 % IV SOLN
2.9000 mg/kg | Freq: Once | INTRAVENOUS | Status: AC
  Administered 2015-04-13: 180 mg via INTRAVENOUS
  Filled 2015-04-13: qty 18

## 2015-04-13 MED ORDER — OXYCODONE-ACETAMINOPHEN 5-325 MG PO TABS: 1.0000 | ORAL_TABLET | ORAL | Status: DC | PRN

## 2015-04-13 NOTE — Patient Instructions (Signed)
[facility] Discharge Instructions for Patients Receiving Chemotherapy  Today you received the following chemotherapy agents Nivolumab.  To help prevent nausea and vomiting after your treatment, we encourage you to take your nausea medication as directed.    If you develop nausea and vomiting that is not controlled by your nausea medication, call the clinic.   BELOW ARE SYMPTOMS THAT SHOULD BE REPORTED IMMEDIATELY:  *FEVER GREATER THAN 100.5 F  *CHILLS WITH OR WITHOUT FEVER  NAUSEA AND VOMITING THAT IS NOT CONTROLLED WITH YOUR NAUSEA MEDICATION  *UNUSUAL SHORTNESS OF BREATH  *UNUSUAL BRUISING OR BLEEDING  TENDERNESS IN MOUTH AND THROAT WITH OR WITHOUT PRESENCE OF ULCERS  *URINARY PROBLEMS  *BOWEL PROBLEMS  UNUSUAL RASH Items with * indicate a potential emergency and should be followed up as soon as possible.  Feel free to call the clinic you have any questions or concerns. The clinic phone number is [phone].  Please show the CHEMO ALERT CARD at check-in to the Emergency Department and triage nurse.  Nivolumab injection What is this medicine? NIVOLUMAB (nye VOL ue mab) is used to treat certain types of melanoma and lung cancer. This medicine may be used for other purposes; ask your health care provider or pharmacist if you have questions. COMMON BRAND NAME(S): Opdivo What should I tell my health care provider before I take this medicine? They need to know if you have any of these conditions: -eye disease, vision problems -history of pancreatitis -immune system problems -inflammatory bowel disease -kidney disease -liver disease -lung disease -lupus -myasthenia gravis -multiple sclerosis -organ transplant -stomach or intestine problems -thyroid disease -tingling of the fingers or toes, or other nerve disorder -an unusual or allergic reaction to nivolumab, other medicines, foods, dyes, or preservatives -pregnant or trying to get  pregnant -breast-feeding How should I use this medicine? This medicine is for infusion into a vein. It is given by a health care professional in a hospital or clinic setting. A special MedGuide will be given to you before each treatment. Be sure to read this information carefully each time. Talk to your pediatrician regarding the use of this medicine in children. Special care may be needed. Overdosage: If you think you've taken too much of this medicine contact a poison control center or emergency room at once. Overdosage: If you think you have taken too much of this medicine contact a poison control center or emergency room at once. NOTE: This medicine is only for you. Do not share this medicine with others. What if I miss a dose? It is important not to miss your dose. Call your doctor or health care professional if you are unable to keep an appointment. What may interact with this medicine? Interactions have not been studied. This list may not describe all possible interactions. Give your health care provider a list of all the medicines, herbs, non-prescription drugs, or dietary supplements you use. Also tell them if you smoke, drink alcohol, or use illegal drugs. Some items may interact with your medicine. What should I watch for while using this medicine? Tell your doctor or healthcare professional if your symptoms do not start to get better or if they get worse. Your condition will be monitored carefully while you are receiving this medicine. You may need blood work done while you are taking this medicine. What side effects may I notice from receiving this medicine? Side effects that you should report to your doctor or health care professional as soon as possible: -allergic reactions like   skin rash, itching or hives, swelling of the face, lips, or tongue -black, tarry stools -bloody or watery diarrhea -changes in vision -chills -cough -depressed mood -eye pain -feeling  anxious -fever -general ill feeling or flu-like symptoms -hair loss -loss of appetite -low blood counts - this medicine may decrease the number of white blood cells, red blood cells and platelets. You may be at increased risk for infections and bleeding -pain, tingling, numbness in the hands or feet -redness, blistering, peeling or loosening of the skin, including inside the mouth -red pinpoint spots on skin -signs of decreased platelets or bleeding - bruising, pinpoint red spots on the skin, black, tarry stools, blood in the urine -signs of decreased red blood cells - unusually weak or tired, feeling faint or lightheaded, falls -signs of infection - fever or chills, cough, sore throat, pain or trouble passing urine -signs and symptoms of a dangerous change in heartbeat or heart rhythm like chest pain; dizziness; fast or irregular heartbeat; palpitations; feeling faint or lightheaded, falls; breathing problems -signs and symptoms of high blood sugar such as dizziness; dry mouth; dry skin; fruity breath; nausea; stomach pain; increased hunger or thirst; increased urination -signs and symptoms of kidney injury like trouble passing urine or change in the amount of urine -signs and symptoms of liver injury like dark yellow or brown urine; general ill feeling or flu-like symptoms; light-colored stools; loss of appetite; nausea; right upper belly pain; unusually weak or tired; yellowing of the eyes or skin -signs and symptoms of increased potassium like muscle weakness; chest pain; or fast, irregular heartbeat -signs and symptoms of low potassium like muscle cramps or muscle pain; chest pain; dizziness; feeling faint or lightheaded, falls; palpitations; breathing problems; or fast, irregular heartbeat -swelling of the ankles, feet, hands -weight gainSide effects that usually do not require medical attention (report to your doctor or health care professional if they continue or are  bothersome): -constipation -general ill feeling or flu-like symptoms -hair loss -loss of appetite -nausea, vomiting This list may not describe all possible side effects. Call your doctor for medical advice about side effects. You may report side effects to FDA at 1-800-FDA-1088. Where should I keep my medicine? This drug is given in a hospital or clinic and will not be stored at home. NOTE: This sheet is a summary. It may not cover all possible information. If you have questions about this medicine, talk to your doctor, pharmacist, or health care provider.  2015, [employer]vier/Gold Standard. (2013-10-26 13:18:19)  

## 2015-04-13 NOTE — Progress Notes (Signed)
0823 pt reports pain rated a 9 out of 10 from the waist down. Pt states home medications works for him but he has been out for 2 days, Diane, Dr. Worthy Flank nurse aware. Percocet 5-'325mg'$  given per orders and at 0930 pt states that he has "forgotten about the pain" that he "feels okay now".   Reviewed labs with Dr. Julien Nordmann. Per Dr. Julien Nordmann okay to proceed with treatment.

## 2015-04-20 ENCOUNTER — Other Ambulatory Visit: Payer: Medicare Other

## 2015-04-27 ENCOUNTER — Ambulatory Visit (HOSPITAL_BASED_OUTPATIENT_CLINIC_OR_DEPARTMENT_OTHER): Payer: Managed Care, Other (non HMO) | Admitting: Internal Medicine

## 2015-04-27 ENCOUNTER — Other Ambulatory Visit (HOSPITAL_BASED_OUTPATIENT_CLINIC_OR_DEPARTMENT_OTHER): Payer: Managed Care, Other (non HMO)

## 2015-04-27 ENCOUNTER — Encounter: Payer: Self-pay | Admitting: Internal Medicine

## 2015-04-27 ENCOUNTER — Ambulatory Visit (HOSPITAL_BASED_OUTPATIENT_CLINIC_OR_DEPARTMENT_OTHER): Payer: Managed Care, Other (non HMO)

## 2015-04-27 VITALS — BP 118/78 | HR 112 | Temp 98.1°F | Resp 19 | Ht 69.0 in | Wt 145.5 lb

## 2015-04-27 DIAGNOSIS — C3411 Malignant neoplasm of upper lobe, right bronchus or lung: Secondary | ICD-10-CM

## 2015-04-27 DIAGNOSIS — C7889 Secondary malignant neoplasm of other digestive organs: Secondary | ICD-10-CM

## 2015-04-27 DIAGNOSIS — C3491 Malignant neoplasm of unspecified part of right bronchus or lung: Secondary | ICD-10-CM

## 2015-04-27 DIAGNOSIS — C7951 Secondary malignant neoplasm of bone: Secondary | ICD-10-CM

## 2015-04-27 DIAGNOSIS — C801 Malignant (primary) neoplasm, unspecified: Secondary | ICD-10-CM

## 2015-04-27 DIAGNOSIS — C787 Secondary malignant neoplasm of liver and intrahepatic bile duct: Secondary | ICD-10-CM

## 2015-04-27 DIAGNOSIS — Z5112 Encounter for antineoplastic immunotherapy: Secondary | ICD-10-CM

## 2015-04-27 LAB — CBC WITH DIFFERENTIAL/PLATELET
BASO%: 0.5 % (ref 0.0–2.0)
BASOS ABS: 0.1 10*3/uL (ref 0.0–0.1)
EOS ABS: 0.2 10*3/uL (ref 0.0–0.5)
EOS%: 2.4 % (ref 0.0–7.0)
HCT: 27.3 % — ABNORMAL LOW (ref 38.4–49.9)
HGB: 8.5 g/dL — ABNORMAL LOW (ref 13.0–17.1)
LYMPH%: 28.4 % (ref 14.0–49.0)
MCH: 29.3 pg (ref 27.2–33.4)
MCHC: 31.1 g/dL — AB (ref 32.0–36.0)
MCV: 94.1 fL (ref 79.3–98.0)
MONO#: 0.7 10*3/uL (ref 0.1–0.9)
MONO%: 7.8 % (ref 0.0–14.0)
NEUT#: 5.7 10*3/uL (ref 1.5–6.5)
NEUT%: 60.9 % (ref 39.0–75.0)
NRBC: 0 % (ref 0–0)
PLATELETS: 325 10*3/uL (ref 140–400)
RBC: 2.9 10*6/uL — AB (ref 4.20–5.82)
RDW: 17.3 % — AB (ref 11.0–14.6)
WBC: 9.4 10*3/uL (ref 4.0–10.3)
lymph#: 2.7 10*3/uL (ref 0.9–3.3)

## 2015-04-27 LAB — COMPREHENSIVE METABOLIC PANEL (CC13)
ALT: 38 U/L (ref 0–55)
ANION GAP: 11 meq/L (ref 3–11)
AST: 27 U/L (ref 5–34)
Albumin: 2.8 g/dL — ABNORMAL LOW (ref 3.5–5.0)
Alkaline Phosphatase: 136 U/L (ref 40–150)
BUN: 26.8 mg/dL — ABNORMAL HIGH (ref 7.0–26.0)
CHLORIDE: 114 meq/L — AB (ref 98–109)
CO2: 14 meq/L — AB (ref 22–29)
Calcium: 8.7 mg/dL (ref 8.4–10.4)
Creatinine: 2.5 mg/dL — ABNORMAL HIGH (ref 0.7–1.3)
EGFR: 32 mL/min/{1.73_m2} — AB (ref >90)
GLUCOSE: 73 mg/dL (ref 70–140)
POTASSIUM: 5 meq/L (ref 3.5–5.1)
SODIUM: 140 meq/L (ref 136–145)
TOTAL PROTEIN: 7.5 g/dL (ref 6.4–8.3)
Total Bilirubin: 0.24 mg/dL (ref 0.20–1.20)

## 2015-04-27 MED ORDER — SODIUM CHLORIDE 0.9 % IJ SOLN
10.0000 mL | INTRAMUSCULAR | Status: DC | PRN
  Administered 2015-04-27: 10 mL
  Filled 2015-04-27: qty 10

## 2015-04-27 MED ORDER — NIVOLUMAB CHEMO INJECTION 100 MG/10ML
2.9000 mg/kg | Freq: Once | INTRAVENOUS | Status: AC
  Administered 2015-04-27: 180 mg via INTRAVENOUS
  Filled 2015-04-27: qty 18

## 2015-04-27 MED ORDER — HEPARIN SOD (PORK) LOCK FLUSH 100 UNIT/ML IV SOLN
500.0000 [IU] | Freq: Once | INTRAVENOUS | Status: AC | PRN
  Administered 2015-04-27: 500 [IU]
  Filled 2015-04-27: qty 5

## 2015-04-27 MED ORDER — OXYCODONE-ACETAMINOPHEN 5-325 MG PO TABS: 1.0000 | ORAL_TABLET | ORAL | Status: DC | PRN

## 2015-04-27 MED ORDER — SODIUM CHLORIDE 0.9 % IV SOLN
Freq: Once | INTRAVENOUS | Status: AC
  Administered 2015-04-27: 11:00:00 via INTRAVENOUS

## 2015-04-27 NOTE — Progress Notes (Signed)
[name]:(336) [phone]   Fax:(336) [phone]  OFFICE PROGRESS NOTE  Francisco Love, Francisco Heinrich, MD [place] [address]  DIAGNOSIS: Stage IV (T2b, N2, M1b) non-small cell lung cancer, adenocarcinoma with negative EGFR mutation and negative gene translocation presented with a large right hilar mass as well as mediastinal lymphadenopathy and metastatic disease to the liver, bone and pancreas diagnosed in February 2016.  PRIOR THERAPY: Systemic chemotherapy with carboplatin for AUC of 5 and Alimta 500 MG/M2 every 3 weeks. Status post 6 cycles.  CURRENT THERAPY: Immunotherapy with Nivolumab 3 MG/KG every 2 weeks, status post 1 cycle.  INTERVAL HISTORY: Francisco Love 54 y.o. male returns to the clinic today for follow-up visit accompanied by a family member. The patient is feeling fine today but he was just discharged from the hospital in [place] earlier today after being admitted for evaluation of confusion started few days after his previous treatment. He was treated for questionable pneumonia. He continues to have renal insufficiency. He denied having any concerning adverse effect from immunotherapy. He denied having any nausea, vomiting or diarrhea. He denied having any significant skin rash. The patient denied having any chest pain but continues to have shortness breath and he is currently on home oxygen with mild cough and no hemoptysis. He is here today to start cycle #2 of his immunotherapy.  MEDICAL HISTORY: Past Medical History  Diagnosis Date  . Shortness of breath dyspnea     ALLERGIES:  has No Known Allergies.  MEDICATIONS:  Current Outpatient Prescriptions  Medication Sig Dispense Refill  . acetaminophen (TYLENOL) 325 MG tablet Take 2 tablets (650 mg total) by mouth every 6 (six) hours as needed for mild pain (or Fever >/= 101).    Marland Kitchen albuterol (PROVENTIL) (2.5 MG/3ML) 0.083% nebulizer solution Take 3 mLs (2.5 mg total) by  nebulization every 2 (two) hours as needed for wheezing. 75 mL 12  . AMITIZA 24 MCG capsule     . dexamethasone (DECADRON) 4 MG tablet Take 1 tablet (4 mg total) by mouth 2 (two) times daily with a meal. 30 tablet 0  . diphenhydrAMINE (SOMINEX) 25 MG tablet Take 25 mg by mouth as needed for itching or sleep.    . feeding supplement, ENSURE COMPLETE, (ENSURE COMPLETE) LIQD Take 237 mLs by mouth 3 (three) times daily between meals.    . folic acid (FOLVITE) 1 MG tablet Take 1 tablet (1 mg total) by mouth daily. 30 tablet 0  . lidocaine-prilocaine (EMLA) cream Apply 1 application topically as needed. Apply small amount  to port site 1.5 hours prior to chemotherapy. Do not rub in cream . Cover with cling wrap 30 g 0  . mirtazapine (REMERON) 15 MG tablet Take 1 tablet (15 mg total) by mouth at bedtime.    Marland Kitchen oxyCODONE (OXY IR/ROXICODONE) 5 MG immediate release tablet Take 1 tablet (5 mg total) by mouth every 4 (four) hours as needed for moderate pain. 20 tablet 0  . oxyCODONE-acetaminophen (PERCOCET/ROXICET) 5-325 MG per tablet Take 1 tablet by mouth every 4 (four) hours as needed for severe pain. 60 tablet 0  . prochlorperazine (COMPAZINE) 10 MG tablet Take 1 tablet (10 mg total) by mouth every 6 (six) hours as needed for nausea or vomiting. 30 tablet 1  . senna-docusate (SENOKOT-S) 8.6-50 MG per tablet Take 1 tablet by mouth daily.    . sodium polystyrene (KAYEXALATE) 15 GM/60ML suspension Take 120 mLs (30 g total) by mouth  once. 120 mL 0  . sodium polystyrene (KAYEXALATE) 15 GM/60ML suspension Take 60 mLs (15 g total) by mouth once. 60 mL 0  . tamsulosin (FLOMAX) 0.4 MG CAPS capsule      No current facility-administered medications for this visit.    SURGICAL HISTORY:  Past Surgical History  Procedure Laterality Date  . Appendectomy    . Video bronchoscopy N/A 09/21/2014    Procedure: VIDEO BRONCHOSCOPY WITH FLUORO;  Surgeon: Rigoberto Noel, MD;  Location: [facility];  Service: Cardiopulmonary;   Laterality: N/A;  . Video bronchoscopy with endobronchial ultrasound N/A 09/23/2014    Procedure: VIDEO BRONCHOSCOPY WITH ENDOBRONCHIAL ULTRASOUND;  Surgeon: Rigoberto Noel, MD;  Location: [facility];  Service: Pulmonary;  Laterality: N/A;    REVIEW OF SYSTEMS:  A comprehensive review of systems was negative except for: Constitutional: positive for fatigue Respiratory: positive for cough and dyspnea on exertion Musculoskeletal: positive for bone pain and muscle weakness   PHYSICAL EXAMINATION: General appearance: alert, cooperative, fatigued and no distress Head: Normocephalic, without obvious abnormality, atraumatic Neck: no adenopathy, no JVD, supple, symmetrical, trachea midline and thyroid not enlarged, symmetric, no tenderness/mass/nodules Lymph nodes: Cervical, supraclavicular, and axillary nodes normal. Resp: clear to auscultation bilaterally Back: symmetric, no curvature. ROM normal. No CVA tenderness. Cardio: regular rate and rhythm, S1, S2 normal, no murmur, click, rub or gallop GI: soft, non-tender; bowel sounds normal; no masses,  no organomegaly Extremities: extremities normal, atraumatic, no cyanosis or edema  ECOG PERFORMANCE STATUS: 2 - Symptomatic, <50% confined to bed  Blood pressure 118/78, pulse 112, temperature 98.1 F (36.7 C), temperature source Oral, resp. rate 19, height 5' 9"  (1.753 m), weight 145 lb 8 oz (65.998 kg), SpO2 100 %.  LABORATORY DATA: Lab Results  Component Value Date   WBC 9.4 04/27/2015   HGB 8.5* 04/27/2015   HCT 27.3* 04/27/2015   MCV 94.1 04/27/2015   PLT 325 04/27/2015      Chemistry      Component Value Date/Time   NA 140 04/27/2015 0945   NA 135 10/25/2014 0845   K 5.0 04/27/2015 0945   K 5.3* 10/25/2014 0845   CL 108 10/25/2014 0845   CO2 14* 04/27/2015 0945   CO2 19 10/25/2014 0845   BUN 26.8* 04/27/2015 0945   BUN 22 10/25/2014 0845   CREATININE 2.5* 04/27/2015 0945   CREATININE 1.03 10/25/2014 0845      Component Value  Date/Time   CALCIUM 8.7 04/27/2015 0945   CALCIUM 8.6 10/25/2014 0845   ALKPHOS 136 04/27/2015 0945   ALKPHOS 882* 09/20/2014 1120   AST 27 04/27/2015 0945   AST 29 09/20/2014 1120   ALT 38 04/27/2015 0945   ALT 34 09/20/2014 1120   BILITOT 0.24 04/27/2015 0945   BILITOT 0.1* 09/20/2014 1120       RADIOGRAPHIC STUDIES: No results found.  ASSESSMENT AND PLAN: This is a very pleasant 54 years old African-American male with metastatic non-small cell lung cancer, adenocarcinoma status post induction chemotherapy with carboplatin and Alimta with initial good response followed by disease progression. The patient is currently on second line treatment with immunotherapy with Nivolumab status post 1 cycle. He tolerated the first cycle of his treatment fairly well with no concerning adverse effects. I recommended for the patient to proceed with cycle #2 of his immunotherapy today as scheduled. For pain management he was given a refill of Percocet. The patient would come back for follow-up visit in 2 weeks for reevaluation before starting cycle #3  He was advised to call immediately if he has any concerning symptoms in the interval. The patient voices understanding of current disease status and treatment options and is in agreement with the current care plan.  All questions were answered. The patient knows to call the clinic with any problems, questions or concerns. We can certainly see the patient much sooner if necessary.  Disclaimer: This note was dictated with voice recognition software. Similar sounding words can inadvertently be transcribed and may not be corrected upon review.

## 2015-04-27 NOTE — Progress Notes (Signed)
Reviewed labs. OK to treat with elevated creatinine per Dr. Julien Nordmann.

## 2015-04-27 NOTE — Patient Instructions (Signed)
[facility] Discharge Instructions for Patients Receiving Chemotherapy  Today you received the following chemotherapy agents: Nivolumab.  To help prevent nausea and vomiting after your treatment, we encourage you to take your nausea medication: compazine 10 mg every 6 hours.   If you develop nausea and vomiting that is not controlled by your nausea medication, call the clinic.   BELOW ARE SYMPTOMS THAT SHOULD BE REPORTED IMMEDIATELY:  *FEVER GREATER THAN 100.5 F  *CHILLS WITH OR WITHOUT FEVER  NAUSEA AND VOMITING THAT IS NOT CONTROLLED WITH YOUR NAUSEA MEDICATION  *UNUSUAL SHORTNESS OF BREATH  *UNUSUAL BRUISING OR BLEEDING  TENDERNESS IN MOUTH AND THROAT WITH OR WITHOUT PRESENCE OF ULCERS  *URINARY PROBLEMS  *BOWEL PROBLEMS  UNUSUAL RASH Items with * indicate a potential emergency and should be followed up as soon as possible.  Feel free to call the clinic you have any questions or concerns. The clinic phone number is [phone].  Please show the [facility] at check-in to the Emergency Department and triage nurse.

## 2015-05-02 ENCOUNTER — Telehealth: Payer: Self-pay | Admitting: Medical Oncology

## 2015-05-02 NOTE — Telephone Encounter (Signed)
I told [facility] to look on [employer] for records. Dr Mina Marble wants to see pt for f/u prostate cancer

## 2015-05-04 ENCOUNTER — Other Ambulatory Visit: Payer: Medicare Other

## 2015-05-09 ENCOUNTER — Telehealth: Payer: Self-pay | Admitting: Internal Medicine

## 2015-05-09 ENCOUNTER — Telehealth: Payer: Self-pay | Admitting: Medical Oncology

## 2015-05-09 DIAGNOSIS — C801 Malignant (primary) neoplasm, unspecified: Principal | ICD-10-CM

## 2015-05-09 DIAGNOSIS — C7951 Secondary malignant neoplasm of bone: Secondary | ICD-10-CM

## 2015-05-09 MED ORDER — OXYCODONE-ACETAMINOPHEN 5-325 MG PO TABS: 1.0000 | ORAL_TABLET | ORAL | Status: DC | PRN

## 2015-05-09 NOTE — Telephone Encounter (Signed)
Added f/u to 9/21 lab/chemo per 9/19 pof. Left message for patient re change and new time for 8:30 am.

## 2015-05-09 NOTE — Telephone Encounter (Signed)
Pt has been out of pain med since last Thursday and needs refill. Asking if he needs to see MD this week.

## 2015-05-09 NOTE — Telephone Encounter (Signed)
rs ready for pick up and family member notified.

## 2015-05-11 ENCOUNTER — Encounter: Payer: Self-pay | Admitting: Internal Medicine

## 2015-05-11 ENCOUNTER — Ambulatory Visit (HOSPITAL_BASED_OUTPATIENT_CLINIC_OR_DEPARTMENT_OTHER): Payer: Medicare Other | Admitting: Internal Medicine

## 2015-05-11 ENCOUNTER — Other Ambulatory Visit (HOSPITAL_BASED_OUTPATIENT_CLINIC_OR_DEPARTMENT_OTHER): Payer: Medicare Other

## 2015-05-11 ENCOUNTER — Telehealth: Payer: Self-pay | Admitting: *Deleted

## 2015-05-11 ENCOUNTER — Telehealth: Payer: Self-pay | Admitting: Internal Medicine

## 2015-05-11 ENCOUNTER — Ambulatory Visit (HOSPITAL_BASED_OUTPATIENT_CLINIC_OR_DEPARTMENT_OTHER): Payer: Medicare Other

## 2015-05-11 VITALS — BP 105/75 | HR 106 | Temp 98.2°F | Resp 18 | Ht 69.0 in | Wt 138.6 lb

## 2015-05-11 DIAGNOSIS — C3491 Malignant neoplasm of unspecified part of right bronchus or lung: Secondary | ICD-10-CM

## 2015-05-11 DIAGNOSIS — Z5112 Encounter for antineoplastic immunotherapy: Secondary | ICD-10-CM | POA: Insufficient documentation

## 2015-05-11 DIAGNOSIS — C787 Secondary malignant neoplasm of liver and intrahepatic bile duct: Secondary | ICD-10-CM

## 2015-05-11 DIAGNOSIS — C801 Malignant (primary) neoplasm, unspecified: Principal | ICD-10-CM

## 2015-05-11 DIAGNOSIS — C3411 Malignant neoplasm of upper lobe, right bronchus or lung: Secondary | ICD-10-CM | POA: Diagnosis present

## 2015-05-11 DIAGNOSIS — C7951 Secondary malignant neoplasm of bone: Secondary | ICD-10-CM | POA: Diagnosis not present

## 2015-05-11 DIAGNOSIS — C7989 Secondary malignant neoplasm of other specified sites: Secondary | ICD-10-CM

## 2015-05-11 DIAGNOSIS — Z79899 Other long term (current) drug therapy: Secondary | ICD-10-CM | POA: Diagnosis not present

## 2015-05-11 LAB — CBC WITH DIFFERENTIAL/PLATELET
BASO%: 0.3 % (ref 0.0–2.0)
Basophils Absolute: 0 10*3/uL (ref 0.0–0.1)
EOS ABS: 0.2 10*3/uL (ref 0.0–0.5)
EOS%: 1.7 % (ref 0.0–7.0)
HCT: 31.4 % — ABNORMAL LOW (ref 38.4–49.9)
HEMOGLOBIN: 10.2 g/dL — AB (ref 13.0–17.1)
LYMPH#: 2.2 10*3/uL (ref 0.9–3.3)
LYMPH%: 22.6 % (ref 14.0–49.0)
MCH: 29.7 pg (ref 27.2–33.4)
MCHC: 32.5 g/dL (ref 32.0–36.0)
MCV: 91.3 fL (ref 79.3–98.0)
MONO#: 0.9 10*3/uL (ref 0.1–0.9)
MONO%: 9.2 % (ref 0.0–14.0)
NEUT%: 66.2 % (ref 39.0–75.0)
NEUTROS ABS: 6.4 10*3/uL (ref 1.5–6.5)
Platelets: 301 10*3/uL (ref 140–400)
RBC: 3.44 10*6/uL — ABNORMAL LOW (ref 4.20–5.82)
RDW: 16.3 % — AB (ref 11.0–14.6)
WBC: 9.6 10*3/uL (ref 4.0–10.3)

## 2015-05-11 LAB — COMPREHENSIVE METABOLIC PANEL (CC13)
ALT: 25 U/L (ref 0–55)
ANION GAP: 11 meq/L (ref 3–11)
AST: 19 U/L (ref 5–34)
Albumin: 3.1 g/dL — ABNORMAL LOW (ref 3.5–5.0)
Alkaline Phosphatase: 150 U/L (ref 40–150)
BUN: 42.9 mg/dL — ABNORMAL HIGH (ref 7.0–26.0)
CO2: 17 meq/L — AB (ref 22–29)
CREATININE: 3 mg/dL — AB (ref 0.7–1.3)
Calcium: 9 mg/dL (ref 8.4–10.4)
Chloride: 110 mEq/L — ABNORMAL HIGH (ref 98–109)
EGFR: 26 mL/min/{1.73_m2} — ABNORMAL LOW (ref >90)
GLUCOSE: 80 mg/dL (ref 70–140)
Potassium: 4.8 mEq/L (ref 3.5–5.1)
SODIUM: 137 meq/L (ref 136–145)
TOTAL PROTEIN: 8.3 g/dL (ref 6.4–8.3)

## 2015-05-11 LAB — TSH CHCC: TSH: 1.579 m[IU]/L (ref 0.320–4.118)

## 2015-05-11 MED ORDER — SODIUM CHLORIDE 0.9 % IV SOLN
Freq: Once | INTRAVENOUS | Status: AC
  Administered 2015-05-11: 10:00:00 via INTRAVENOUS

## 2015-05-11 MED ORDER — HEPARIN SOD (PORK) LOCK FLUSH 100 UNIT/ML IV SOLN
500.0000 [IU] | Freq: Once | INTRAVENOUS | Status: AC | PRN
  Administered 2015-05-11: 500 [IU]
  Filled 2015-05-11: qty 5

## 2015-05-11 MED ORDER — SODIUM CHLORIDE 0.9 % IJ SOLN
10.0000 mL | INTRAMUSCULAR | Status: DC | PRN
  Administered 2015-05-11: 10 mL
  Filled 2015-05-11: qty 10

## 2015-05-11 MED ORDER — SODIUM CHLORIDE 0.9 % IV SOLN
2.9000 mg/kg | Freq: Once | INTRAVENOUS | Status: AC
  Administered 2015-05-11: 180 mg via INTRAVENOUS
  Filled 2015-05-11: qty 18

## 2015-05-11 NOTE — Telephone Encounter (Signed)
Per staff message and POF I have scheduled appts. Advised scheduler of appts. JMW  

## 2015-05-11 NOTE — Progress Notes (Signed)
[facility] [phone]   Fax:(336) [phone]  OFFICE PROGRESS NOTE  [name], [name], MD [place] [address]  DIAGNOSIS: Stage IV (T2b, N2, M1b) non-small cell lung cancer, adenocarcinoma with negative EGFR mutation and negative gene translocation presented with a large right hilar mass as well as mediastinal lymphadenopathy and metastatic disease to the liver, bone and pancreas diagnosed in February 2016.  PRIOR THERAPY: Systemic chemotherapy with carboplatin for AUC of 5 and Alimta 500 MG/M2 every 3 weeks. Status post 6 cycles.  CURRENT THERAPY: Immunotherapy with Nivolumab 3 MG/KG every 2 weeks, status post 2 cycles.  INTERVAL HISTORY: Francisco Love 54 y.o. male returns to the clinic today for follow-up visit accompanied by a family member. The patient is tolerating his current treatment with immunotherapy with Nivolumab fairly well. He denied having any significant nausea or vomiting. He has no diarrhea. The patient denied having any fever or chills, no significant weight loss or night sweats. He has no chest pain but continues to have shortness of breath and mild cough with no hemoptysis. He continues to have low back pain and currently on Percocet with improvement of his pain after taking the pain medication. He is scheduled to see a urologist at [facility] for evaluation of his prostate cancer next week. The patient is here today to start cycle #3 of his immunotherapy.  MEDICAL HISTORY: Past Medical History  Diagnosis Date  . Shortness of breath dyspnea     ALLERGIES:  has No Known Allergies.  MEDICATIONS:  Current Outpatient Prescriptions  Medication Sig Dispense Refill  . acetaminophen (TYLENOL) 325 MG tablet Take 2 tablets (650 mg total) by mouth every 6 (six) hours as needed for mild pain (or Fever >/= 101).    Marland Kitchen albuterol (PROVENTIL) (2.5 MG/3ML) 0.083% nebulizer solution Take 3 mLs (2.5 mg total) by nebulization  every 2 (two) hours as needed for wheezing. 75 mL 12  . AMITIZA 24 MCG capsule     . dexamethasone (DECADRON) 4 MG tablet Take 1 tablet (4 mg total) by mouth 2 (two) times daily with a meal. 30 tablet 0  . diphenhydrAMINE (SOMINEX) 25 MG tablet Take 25 mg by mouth as needed for itching or sleep.    . feeding supplement, ENSURE COMPLETE, (ENSURE COMPLETE) LIQD Take 237 mLs by mouth 3 (three) times daily between meals.    . folic acid (FOLVITE) 1 MG tablet Take 1 tablet (1 mg total) by mouth daily. 30 tablet 0  . lidocaine-prilocaine (EMLA) cream Apply 1 application topically as needed. Apply small amount  to port site 1.5 hours prior to chemotherapy. Do not rub in cream . Cover with cling wrap 30 g 0  . mirtazapine (REMERON) 15 MG tablet Take 1 tablet (15 mg total) by mouth at bedtime.    Marland Kitchen oxyCODONE (OXY IR/ROXICODONE) 5 MG immediate release tablet Take 1 tablet (5 mg total) by mouth every 4 (four) hours as needed for moderate pain. 20 tablet 0  . oxyCODONE-acetaminophen (PERCOCET/ROXICET) 5-325 MG per tablet Take 1 tablet by mouth every 4 (four) hours as needed for severe pain. 60 tablet 0  . prochlorperazine (COMPAZINE) 10 MG tablet Take 1 tablet (10 mg total) by mouth every 6 (six) hours as needed for nausea or vomiting. 30 tablet 1  . senna-docusate (SENOKOT-S) 8.6-50 MG per tablet Take 1 tablet by mouth daily.    . sodium polystyrene (KAYEXALATE) 15 GM/60ML suspension Take 120 mLs (  30 g total) by mouth once. 120 mL 0  . sodium polystyrene (KAYEXALATE) 15 GM/60ML suspension Take 60 mLs (15 g total) by mouth once. 60 mL 0  . tamsulosin (FLOMAX) 0.4 MG CAPS capsule      No current facility-administered medications for this visit.    SURGICAL HISTORY:  Past Surgical History  Procedure Laterality Date  . Appendectomy    . Video bronchoscopy N/A 09/21/2014    Procedure: VIDEO BRONCHOSCOPY WITH FLUORO;  Surgeon: Rigoberto Noel, MD;  Location: [facility];  Service: Cardiopulmonary;  Laterality:  N/A;  . Video bronchoscopy with endobronchial ultrasound N/A 09/23/2014    Procedure: VIDEO BRONCHOSCOPY WITH ENDOBRONCHIAL ULTRASOUND;  Surgeon: Rigoberto Noel, MD;  Location: [facility];  Service: Pulmonary;  Laterality: N/A;    REVIEW OF SYSTEMS:  A comprehensive review of systems was negative except for: Constitutional: positive for fatigue Respiratory: positive for cough and dyspnea on exertion Musculoskeletal: positive for bone pain and muscle weakness   PHYSICAL EXAMINATION: General appearance: alert, cooperative, fatigued and no distress Head: Normocephalic, without obvious abnormality, atraumatic Neck: no adenopathy, no JVD, supple, symmetrical, trachea midline and thyroid not enlarged, symmetric, no tenderness/mass/nodules Lymph nodes: Cervical, supraclavicular, and axillary nodes normal. Resp: clear to auscultation bilaterally Back: symmetric, no curvature. ROM normal. No CVA tenderness. Cardio: regular rate and rhythm, S1, S2 normal, no murmur, click, rub or gallop GI: soft, non-tender; bowel sounds normal; no masses,  no organomegaly Extremities: extremities normal, atraumatic, no cyanosis or edema  ECOG PERFORMANCE STATUS: 2 - Symptomatic, <50% confined to bed  Blood pressure 105/75, pulse 106, temperature 98.2 F (36.8 C), temperature source Oral, resp. rate 18, height 5' 9"  (1.753 m), weight 138 lb 9.6 oz (62.869 kg), SpO2 100 %.  LABORATORY DATA: Lab Results  Component Value Date   WBC 9.6 05/11/2015   HGB 10.2* 05/11/2015   HCT 31.4* 05/11/2015   MCV 91.3 05/11/2015   PLT 301 05/11/2015      Chemistry      Component Value Date/Time   NA 137 05/11/2015 0758   NA 135 10/25/2014 0845   K 4.8 05/11/2015 0758   K 5.3* 10/25/2014 0845   CL 108 10/25/2014 0845   CO2 17* 05/11/2015 0758   CO2 19 10/25/2014 0845   BUN 42.9* 05/11/2015 0758   BUN 22 10/25/2014 0845   CREATININE 3.0* 05/11/2015 0758   CREATININE 1.03 10/25/2014 0845      Component Value Date/Time    CALCIUM 9.0 05/11/2015 0758   CALCIUM 8.6 10/25/2014 0845   ALKPHOS 150 05/11/2015 0758   ALKPHOS 882* 09/20/2014 1120   AST 19 05/11/2015 0758   AST 29 09/20/2014 1120   ALT 25 05/11/2015 0758   ALT 34 09/20/2014 1120   BILITOT <0.30 05/11/2015 0758   BILITOT 0.1* 09/20/2014 1120       RADIOGRAPHIC STUDIES: No results found.  ASSESSMENT AND PLAN: This is a very pleasant 54 years old African-American male with metastatic non-small cell lung cancer, adenocarcinoma status post induction chemotherapy with carboplatin and Alimta with initial good response followed by disease progression. The patient is currently on second line treatment with immunotherapy with Nivolumab status post 2 cycles. He tolerated the last cycle of his treatment fairly well with no concerning adverse effects. I recommended for the patient to proceed with cycle #3 of his immunotherapy today as scheduled. For pain management he would continue on Percocet as needed. The patient would come back for follow-up visit in 2 weeks for  reevaluation before starting cycle #4 He was advised to call immediately if he has any concerning symptoms in the interval. The patient voices understanding of current disease status and treatment options and is in agreement with the current care plan.  All questions were answered. The patient knows to call the clinic with any problems, questions or concerns. We can certainly see the patient much sooner if necessary.  Disclaimer: This note was dictated with voice recognition software. Similar sounding words can inadvertently be transcribed and may not be corrected upon review.

## 2015-05-11 NOTE — Progress Notes (Signed)
Per Julien Nordmann it is okay to treat pt today with nivolumab and today's labs.

## 2015-05-11 NOTE — Telephone Encounter (Signed)
per pof to sch pt appt-gave pt copy of avs-sent MW email to sch trmt-pt aware

## 2015-05-11 NOTE — Patient Instructions (Signed)
Nivolumab injection What is this medicine? NIVOLUMAB (nye VOL ue mab) is used to treat certain types of melanoma and lung cancer. This medicine may be used for other purposes; ask your health care provider or pharmacist if you have questions. COMMON BRAND NAME(S): Opdivo What should I tell my health care provider before I take this medicine? They need to know if you have any of these conditions: -eye disease, vision problems -history of pancreatitis -immune system problems -inflammatory bowel disease -kidney disease -liver disease -lung disease -lupus -myasthenia gravis -multiple sclerosis -organ transplant -stomach or intestine problems -thyroid disease -tingling of the fingers or toes, or other nerve disorder -an unusual or allergic reaction to nivolumab, other medicines, foods, dyes, or preservatives -pregnant or trying to get pregnant -breast-feeding How should I use this medicine? This medicine is for infusion into a vein. It is given by a health care professional in a hospital or clinic setting. A special MedGuide will be given to you before each treatment. Be sure to read this information carefully each time. Talk to your pediatrician regarding the use of this medicine in children. Special care may be needed. Overdosage: If you think you've taken too much of this medicine contact a poison control center or emergency room at once. Overdosage: If you think you have taken too much of this medicine contact a poison control center or emergency room at once. NOTE: This medicine is only for you. Do not share this medicine with others. What if I miss a dose? It is important not to miss your dose. Call your doctor or health care professional if you are unable to keep an appointment. What may interact with this medicine? Interactions have not been studied. This list may not describe all possible interactions. Give your health care provider a list of all the medicines, herbs,  non-prescription drugs, or dietary supplements you use. Also tell them if you smoke, drink alcohol, or use illegal drugs. Some items may interact with your medicine. What should I watch for while using this medicine? Tell your doctor or healthcare professional if your symptoms do not start to get better or if they get worse. Your condition will be monitored carefully while you are receiving this medicine. You may need blood work done while you are taking this medicine. What side effects may I notice from receiving this medicine? Side effects that you should report to your doctor or health care professional as soon as possible: -allergic reactions like skin rash, itching or hives, swelling of the face, lips, or tongue -black, tarry stools -bloody or watery diarrhea -changes in vision -chills -cough -depressed mood -eye pain -feeling anxious -fever -general ill feeling or flu-like symptoms -hair loss -loss of appetite -low blood counts - this medicine may decrease the number of white blood cells, red blood cells and platelets. You may be at increased risk for infections and bleeding -pain, tingling, numbness in the hands or feet -redness, blistering, peeling or loosening of the skin, including inside the mouth -red pinpoint spots on skin -signs of decreased platelets or bleeding - bruising, pinpoint red spots on the skin, black, tarry stools, blood in the urine -signs of decreased red blood cells - unusually weak or tired, feeling faint or lightheaded, falls -signs of infection - fever or chills, cough, sore throat, pain or trouble passing urine -signs and symptoms of a dangerous change in heartbeat or heart rhythm like chest pain; dizziness; fast or irregular heartbeat; palpitations; feeling faint or lightheaded, falls; breathing problems -signs   and symptoms of high blood sugar such as dizziness; dry mouth; dry skin; fruity breath; nausea; stomach pain; increased hunger or thirst; increased  urination -signs and symptoms of kidney injury like trouble passing urine or change in the amount of urine -signs and symptoms of liver injury like dark yellow or brown urine; general ill feeling or flu-like symptoms; light-colored stools; loss of appetite; nausea; right upper belly pain; unusually weak or tired; yellowing of the eyes or skin -signs and symptoms of increased potassium like muscle weakness; chest pain; or fast, irregular heartbeat -signs and symptoms of low potassium like muscle cramps or muscle pain; chest pain; dizziness; feeling faint or lightheaded, falls; palpitations; breathing problems; or fast, irregular heartbeat -swelling of the ankles, feet, hands -weight gainSide effects that usually do not require medical attention (report to your doctor or health care professional if they continue or are bothersome): -constipation -general ill feeling or flu-like symptoms -hair loss -loss of appetite -nausea, vomiting This list may not describe all possible side effects. Call your doctor for medical advice about side effects. You may report side effects to FDA at 1-800-FDA-1088. Where should I keep my medicine? This drug is given in a hospital or clinic and will not be stored at home. NOTE: This sheet is a summary. It may not cover all possible information. If you have questions about this medicine, talk to your doctor, pharmacist, or health care provider.  2015, [employer]vier/Gold Standard. (2013-10-26 13:18:19)  

## 2015-05-25 ENCOUNTER — Other Ambulatory Visit: Payer: Self-pay | Admitting: *Deleted

## 2015-05-25 ENCOUNTER — Ambulatory Visit (HOSPITAL_BASED_OUTPATIENT_CLINIC_OR_DEPARTMENT_OTHER): Payer: Managed Care, Other (non HMO) | Admitting: Nurse Practitioner

## 2015-05-25 ENCOUNTER — Other Ambulatory Visit (HOSPITAL_BASED_OUTPATIENT_CLINIC_OR_DEPARTMENT_OTHER): Payer: Medicare Other

## 2015-05-25 ENCOUNTER — Encounter: Payer: Self-pay | Admitting: Nurse Practitioner

## 2015-05-25 ENCOUNTER — Ambulatory Visit (HOSPITAL_BASED_OUTPATIENT_CLINIC_OR_DEPARTMENT_OTHER): Payer: Medicare Other

## 2015-05-25 VITALS — BP 106/73 | HR 94 | Temp 97.5°F | Resp 18 | Ht 69.0 in | Wt 139.0 lb

## 2015-05-25 DIAGNOSIS — C7951 Secondary malignant neoplasm of bone: Secondary | ICD-10-CM

## 2015-05-25 DIAGNOSIS — Z8546 Personal history of malignant neoplasm of prostate: Secondary | ICD-10-CM | POA: Diagnosis not present

## 2015-05-25 DIAGNOSIS — C7889 Secondary malignant neoplasm of other digestive organs: Secondary | ICD-10-CM

## 2015-05-25 DIAGNOSIS — Z5112 Encounter for antineoplastic immunotherapy: Secondary | ICD-10-CM | POA: Diagnosis not present

## 2015-05-25 DIAGNOSIS — C3411 Malignant neoplasm of upper lobe, right bronchus or lung: Secondary | ICD-10-CM

## 2015-05-25 DIAGNOSIS — C787 Secondary malignant neoplasm of liver and intrahepatic bile duct: Secondary | ICD-10-CM | POA: Diagnosis not present

## 2015-05-25 DIAGNOSIS — C801 Malignant (primary) neoplasm, unspecified: Principal | ICD-10-CM

## 2015-05-25 DIAGNOSIS — C3491 Malignant neoplasm of unspecified part of right bronchus or lung: Secondary | ICD-10-CM

## 2015-05-25 LAB — CBC WITH DIFFERENTIAL/PLATELET
BASO%: 1 % (ref 0.0–2.0)
Basophils Absolute: 0.1 10*3/uL (ref 0.0–0.1)
EOS ABS: 0.2 10*3/uL (ref 0.0–0.5)
EOS%: 2 % (ref 0.0–7.0)
HEMATOCRIT: 31.8 % — AB (ref 38.4–49.9)
HGB: 10.2 g/dL — ABNORMAL LOW (ref 13.0–17.1)
LYMPH#: 2 10*3/uL (ref 0.9–3.3)
LYMPH%: 23.2 % (ref 14.0–49.0)
MCH: 28.9 pg (ref 27.2–33.4)
MCHC: 32 g/dL (ref 32.0–36.0)
MCV: 90.5 fL (ref 79.3–98.0)
MONO#: 0.8 10*3/uL (ref 0.1–0.9)
MONO%: 9 % (ref 0.0–14.0)
NEUT#: 5.5 10*3/uL (ref 1.5–6.5)
NEUT%: 64.8 % (ref 39.0–75.0)
PLATELETS: 281 10*3/uL (ref 140–400)
RBC: 3.52 10*6/uL — AB (ref 4.20–5.82)
RDW: 17.1 % — ABNORMAL HIGH (ref 11.0–14.6)
WBC: 8.6 10*3/uL (ref 4.0–10.3)

## 2015-05-25 LAB — COMPREHENSIVE METABOLIC PANEL (CC13)
ALT: 35 U/L (ref 0–55)
ANION GAP: 9 meq/L (ref 3–11)
AST: 24 U/L (ref 5–34)
Albumin: 3.2 g/dL — ABNORMAL LOW (ref 3.5–5.0)
Alkaline Phosphatase: 143 U/L (ref 40–150)
BUN: 41.8 mg/dL — ABNORMAL HIGH (ref 7.0–26.0)
CALCIUM: 9.2 mg/dL (ref 8.4–10.4)
CHLORIDE: 106 meq/L (ref 98–109)
CO2: 19 meq/L — AB (ref 22–29)
CREATININE: 3.2 mg/dL — AB (ref 0.7–1.3)
EGFR: 24 mL/min/{1.73_m2} — AB (ref >90)
Glucose: 101 mg/dl (ref 70–140)
Potassium: 6 mEq/L — ABNORMAL HIGH (ref 3.5–5.1)
Sodium: 134 mEq/L — ABNORMAL LOW (ref 136–145)
TOTAL PROTEIN: 8.2 g/dL (ref 6.4–8.3)

## 2015-05-25 MED ORDER — SODIUM CHLORIDE 0.9 % IV SOLN
Freq: Once | INTRAVENOUS | Status: AC
  Administered 2015-05-25: 11:00:00 via INTRAVENOUS

## 2015-05-25 MED ORDER — OXYCODONE-ACETAMINOPHEN 5-325 MG PO TABS
1.0000 | ORAL_TABLET | Freq: Once | ORAL | Status: AC
Start: 2015-05-25 — End: 2015-05-25
  Administered 2015-05-25: 1 via ORAL

## 2015-05-25 MED ORDER — SODIUM CHLORIDE 0.9 % IV SOLN
180.0000 mg | Freq: Once | INTRAVENOUS | Status: AC
  Administered 2015-05-25: 180 mg via INTRAVENOUS
  Filled 2015-05-25: qty 18

## 2015-05-25 MED ORDER — HEPARIN SOD (PORK) LOCK FLUSH 100 UNIT/ML IV SOLN
500.0000 [IU] | Freq: Once | INTRAVENOUS | Status: AC | PRN
  Administered 2015-05-25: 500 [IU]
  Filled 2015-05-25: qty 5

## 2015-05-25 MED ORDER — OXYCODONE-ACETAMINOPHEN 5-325 MG PO TABS: 1.0000 | ORAL_TABLET | ORAL | Status: DC | PRN

## 2015-05-25 MED ORDER — OXYCODONE-ACETAMINOPHEN 5-325 MG PO TABS
ORAL_TABLET | ORAL | Status: AC
  Filled 2015-05-25: qty 1

## 2015-05-25 MED ORDER — SODIUM CHLORIDE 0.9 % IJ SOLN
10.0000 mL | INTRAMUSCULAR | Status: DC | PRN
  Administered 2015-05-25: 10 mL
  Filled 2015-05-25: qty 10

## 2015-05-25 NOTE — Patient Instructions (Signed)
[facility] Discharge Instructions for Patients Receiving Chemotherapy  Today you received the following chemotherapy agents nivolumab  To help prevent nausea and vomiting after your treatment, we encourage you to take your nausea medication    If you develop nausea and vomiting that is not controlled by your nausea medication, call the clinic.   BELOW ARE SYMPTOMS THAT SHOULD BE REPORTED IMMEDIATELY:  *FEVER GREATER THAN 100.5 F  *CHILLS WITH OR WITHOUT FEVER  NAUSEA AND VOMITING THAT IS NOT CONTROLLED WITH YOUR NAUSEA MEDICATION  *UNUSUAL SHORTNESS OF BREATH  *UNUSUAL BRUISING OR BLEEDING  TENDERNESS IN MOUTH AND THROAT WITH OR WITHOUT PRESENCE OF ULCERS  *URINARY PROBLEMS  *BOWEL PROBLEMS  UNUSUAL RASH Items with * indicate a potential emergency and should be followed up as soon as possible.  Feel free to call the clinic you have any questions or concerns. The clinic phone number is [phone].  Please show the [facility] at check-in to the Emergency Department and triage nurse.

## 2015-05-25 NOTE — Progress Notes (Signed)
[facility] [phone]   [phone]  OFFICE PROGRESS NOTE  [name], [name], MD [place] [address]  DIAGNOSIS: Stage IV (T2b, N2, M1b) non-small cell lung cancer, adenocarcinoma with negative EGFR mutation and negative gene translocation presented with a large right hilar mass as well as mediastinal lymphadenopathy and metastatic disease to the liver, bone and pancreas diagnosed in February 2016.  PRIOR THERAPY: Systemic chemotherapy with carboplatin for AUC of 5 and Alimta 500 MG/M2 every 3 weeks. Status post 6 cycles.  CURRENT THERAPY: Immunotherapy with Nivolumab 3 MG/KG every 2 weeks, status post 3 cycles.  INTERVAL HISTORY: Francisco Love 54 y.o. male returns to the clinic today for follow-up visit, accompanied by his brother in law. He is due for cycle 4 of nivolumab today. He tolerates this well with few complaints. He denies fevers or chills. He uses compazine infrequently for nausea. He uses miralax and senna PRN for constipation due to narcotic use. He is taking percocet PRN for his lower back pain and will need a refill today. He is eating decently, and supplements with Ensure. He is on 2LO2 continuously and does have shortness of breath with a mild cough. He denies chest pain or hemoptysis. He has no night sweats.   MEDICAL HISTORY: Past Medical History  Diagnosis Date  . Shortness of breath dyspnea     ALLERGIES:  has No Known Allergies.  MEDICATIONS:  Current Outpatient Prescriptions  Medication Sig Dispense Refill  . albuterol (PROVENTIL) (2.5 MG/3ML) 0.083% nebulizer solution Take 3 mLs (2.5 mg total) by nebulization every 2 (two) hours as needed for wheezing. 75 mL 12  . AMITIZA 24 MCG capsule     . diphenhydrAMINE (SOMINEX) 25 MG tablet Take 25 mg by mouth as needed for itching or sleep.    . feeding supplement, ENSURE COMPLETE, (ENSURE COMPLETE) LIQD Take 237 mLs by mouth 3 (three) times daily  between meals.    . lidocaine-prilocaine (EMLA) cream Apply 1 application topically as needed. Apply small amount  to port site 1.5 hours prior to chemotherapy. Do not rub in cream . Cover with cling wrap 30 g 0  . mirtazapine (REMERON) 15 MG tablet Take 1 tablet (15 mg total) by mouth at bedtime.    . polyethylene glycol (MIRALAX / GLYCOLAX) packet Take 17 g by mouth daily.    Marland Kitchen senna-docusate (SENOKOT-S) 8.6-50 MG per tablet Take 1 tablet by mouth daily.    . tamsulosin (FLOMAX) 0.4 MG CAPS capsule     . acetaminophen (TYLENOL) 325 MG tablet Take 2 tablets (650 mg total) by mouth every 6 (six) hours as needed for mild pain (or Fever >/= 101). (Patient not taking: Reported on 05/11/2015)    . oxyCODONE-acetaminophen (PERCOCET/ROXICET) 5-325 MG tablet Take 1 tablet by mouth every 4 (four) hours as needed for severe pain. 60 tablet 0  . prochlorperazine (COMPAZINE) 10 MG tablet Take 1 tablet (10 mg total) by mouth every 6 (six) hours as needed for nausea or vomiting. (Patient not taking: Reported on 05/11/2015) 30 tablet 1   No current facility-administered medications for this visit.    SURGICAL HISTORY:  Past Surgical History  Procedure Laterality Date  . Appendectomy    . Video bronchoscopy N/A 09/21/2014    Procedure: VIDEO BRONCHOSCOPY WITH FLUORO;  Surgeon: Rigoberto Noel, MD;  Location: [facility];  Service: Cardiopulmonary;  Laterality: N/A;  . Video bronchoscopy with endobronchial ultrasound N/A 09/23/2014  Procedure: VIDEO BRONCHOSCOPY WITH ENDOBRONCHIAL ULTRASOUND;  Surgeon: Rigoberto Noel, MD;  Location: [facility];  Service: Pulmonary;  Laterality: N/A;    REVIEW OF SYSTEMS:  A comprehensive review of systems was negative except for: Constitutional: positive for fatigue Respiratory: positive for cough and dyspnea on exertion Musculoskeletal: positive for bone pain and muscle weakness   PHYSICAL EXAMINATION: General appearance: alert, cooperative, fatigued and no distress Head:  Normocephalic, without obvious abnormality, atraumatic Neck: no adenopathy, no JVD, supple, symmetrical, trachea midline and thyroid not enlarged, symmetric, no tenderness/mass/nodules Lymph nodes: Cervical, supraclavicular, and axillary nodes normal. Resp: clear to auscultation bilaterally Back: symmetric, no curvature. ROM normal. No CVA tenderness. Cardio: regular rate and rhythm, S1, S2 normal, no murmur, click, rub or gallop GI: soft, non-tender; bowel sounds normal; no masses,  no organomegaly Extremities: extremities normal, atraumatic, no cyanosis or edema  ECOG PERFORMANCE STATUS: 2 - Symptomatic, <50% confined to bed  Blood pressure 106/73, pulse 94, temperature 97.5 F (36.4 C), temperature source Oral, resp. rate 18, height 5' 9"  (1.753 m), weight 139 lb (63.05 kg), SpO2 97 %.  LABORATORY DATA: Lab Results  Component Value Date   WBC 8.6 05/25/2015   HGB 10.2* 05/25/2015   HCT 31.8* 05/25/2015   MCV 90.5 05/25/2015   PLT 281 05/25/2015      Chemistry      Component Value Date/Time   NA 134* 05/25/2015 0826   NA 135 10/25/2014 0845   K 6.0* 05/25/2015 0826   K 5.3* 10/25/2014 0845   CL 108 10/25/2014 0845   CO2 19* 05/25/2015 0826   CO2 19 10/25/2014 0845   BUN 41.8* 05/25/2015 0826   BUN 22 10/25/2014 0845   CREATININE 3.2* 05/25/2015 0826   CREATININE 1.03 10/25/2014 0845      Component Value Date/Time   CALCIUM 9.2 05/25/2015 0826   CALCIUM 8.6 10/25/2014 0845   ALKPHOS 143 05/25/2015 0826   ALKPHOS 882* 09/20/2014 1120   AST 24 05/25/2015 0826   AST 29 09/20/2014 1120   ALT 35 05/25/2015 0826   ALT 34 09/20/2014 1120   BILITOT <0.30 05/25/2015 0826   BILITOT 0.1* 09/20/2014 1120       RADIOGRAPHIC STUDIES: No results found.  ASSESSMENT AND PLAN: This is a very pleasant 54 years old African-American male with metastatic non-small cell lung cancer, adenocarcinoma status post induction chemotherapy with carboplatin and Alimta with initial good  response followed by disease progression. The patient is currently on second line treatment with immunotherapy with Nivolumab status post 3 cycles. He continues to tolerate treatment well with few side effects.  The patient was discussed with Dr. Julien Nordmann who was present for the visit. Dr. Julien Nordmann noted the patient had an elevated PSA at a recent visit to [facility]. Per Dr. Eliane Decree there is a small chance that his metastatic lesions are actually driven by his history of prostate cancer. He had a biopsy last week and we await the results. If it is indeed prostate cancer he could be treated easier with the use of hormone therapy. For now we will continue the chemo as scheduled.  He will proceed with cycle #4 as planned today I have refilled his percocet for the next 2 weeks. He will have a repeat CT of the chest/abdomen/pelvis before the end of this month.  He will return in 2 weeks for evaluation and symptom management prior to the start of cycle #5.  He was advised to call immediately if he has any concerning symptoms  in the interval. The patient voices understanding of current disease status and treatment options and is in agreement with the current care plan.  All questions were answered. The patient knows to call the clinic with any problems, questions or concerns. We can certainly see the patient much sooner if necessary.  Laurie Panda, NP  05/25/2015  ADDENDUM: Hematology/Oncology Attending: I had a face to face encounter with the patient today. I recommended his care plan. This is a very pleasant 54 years old African-American male with metastatic adenocarcinoma of questionable lung primary currently undergoing treatment with immunotherapy with Nivolumab status post 3 cycles. The patient is tolerating his treatment with Nivolumab fairly well with no significant adverse effects. He is currently evaluated for questionable metastatic prostate cancer at [facility]. He underwent prostate biopsy at  [facility] but it was not diagnostic. I recommended for the patient to continue his current treatment with immunotherapy with Nivolumab as a scheduled. He would come back for follow-up visit in 2 weeks for evaluation after repeating CT scan of the chest, abdomen and pelvis for restaging of his disease. The patient was advised to call immediately if he has any concerning symptoms in the interval.  Disclaimer: This note was dictated with voice recognition software. Similar sounding words can inadvertently be transcribed and may be missed upon review. Eilleen Kempf., MD 05/25/2015

## 2015-05-25 NOTE — Progress Notes (Signed)
Okay to treat today despite elevated creatinine per [facility],  via Dr. Julien Nordmann.

## 2015-06-06 ENCOUNTER — Ambulatory Visit (HOSPITAL_COMMUNITY)
Admission: RE | Admit: 2015-06-06 | Discharge: 2015-06-06 | Disposition: A | Discharge status: Home / Self Care | Payer: Medicare Other | Source: Ambulatory Visit | Attending: Nurse Practitioner | Admitting: Nurse Practitioner

## 2015-06-06 ENCOUNTER — Encounter (HOSPITAL_COMMUNITY): Payer: Self-pay

## 2015-06-06 ENCOUNTER — Other Ambulatory Visit: Payer: Self-pay | Admitting: Nurse Practitioner

## 2015-06-06 DIAGNOSIS — R911 Solitary pulmonary nodule: Secondary | ICD-10-CM | POA: Insufficient documentation

## 2015-06-06 DIAGNOSIS — K862 Cyst of pancreas: Secondary | ICD-10-CM | POA: Insufficient documentation

## 2015-06-06 DIAGNOSIS — C7951 Secondary malignant neoplasm of bone: Secondary | ICD-10-CM | POA: Diagnosis present

## 2015-06-06 DIAGNOSIS — C801 Malignant (primary) neoplasm, unspecified: Secondary | ICD-10-CM | POA: Insufficient documentation

## 2015-06-06 DIAGNOSIS — J90 Pleural effusion, not elsewhere classified: Secondary | ICD-10-CM | POA: Insufficient documentation

## 2015-06-08 ENCOUNTER — Other Ambulatory Visit (HOSPITAL_BASED_OUTPATIENT_CLINIC_OR_DEPARTMENT_OTHER): Payer: Medicare Other

## 2015-06-08 ENCOUNTER — Telehealth: Payer: Self-pay | Admitting: Internal Medicine

## 2015-06-08 ENCOUNTER — Ambulatory Visit (HOSPITAL_BASED_OUTPATIENT_CLINIC_OR_DEPARTMENT_OTHER): Payer: Medicare Other

## 2015-06-08 ENCOUNTER — Ambulatory Visit (HOSPITAL_BASED_OUTPATIENT_CLINIC_OR_DEPARTMENT_OTHER): Payer: Medicare Other | Admitting: Physician Assistant

## 2015-06-08 VITALS — BP 100/67 | HR 89 | Temp 98.5°F | Resp 18 | Ht 69.0 in | Wt 139.2 lb

## 2015-06-08 DIAGNOSIS — C787 Secondary malignant neoplasm of liver and intrahepatic bile duct: Secondary | ICD-10-CM

## 2015-06-08 DIAGNOSIS — C3491 Malignant neoplasm of unspecified part of right bronchus or lung: Secondary | ICD-10-CM

## 2015-06-08 DIAGNOSIS — C3411 Malignant neoplasm of upper lobe, right bronchus or lung: Secondary | ICD-10-CM

## 2015-06-08 DIAGNOSIS — C7951 Secondary malignant neoplasm of bone: Secondary | ICD-10-CM

## 2015-06-08 DIAGNOSIS — Z5112 Encounter for antineoplastic immunotherapy: Secondary | ICD-10-CM

## 2015-06-08 DIAGNOSIS — C7889 Secondary malignant neoplasm of other digestive organs: Secondary | ICD-10-CM

## 2015-06-08 DIAGNOSIS — G893 Neoplasm related pain (acute) (chronic): Secondary | ICD-10-CM | POA: Insufficient documentation

## 2015-06-08 DIAGNOSIS — C801 Malignant (primary) neoplasm, unspecified: Secondary | ICD-10-CM

## 2015-06-08 LAB — COMPREHENSIVE METABOLIC PANEL (CC13)
ALBUMIN: 3 g/dL — AB (ref 3.5–5.0)
ALK PHOS: 129 U/L (ref 40–150)
ALT: 20 U/L (ref 0–55)
AST: 16 U/L (ref 5–34)
Anion Gap: 10 mEq/L (ref 3–11)
BUN: 42.4 mg/dL — ABNORMAL HIGH (ref 7.0–26.0)
CALCIUM: 8.9 mg/dL (ref 8.4–10.4)
CO2: 17 mEq/L — ABNORMAL LOW (ref 22–29)
Chloride: 106 mEq/L (ref 98–109)
Creatinine: 3.2 mg/dL (ref 0.7–1.3)
EGFR: 24 mL/min/{1.73_m2} — AB (ref >90)
GLUCOSE: 90 mg/dL (ref 70–140)
Potassium: 5 mEq/L (ref 3.5–5.1)
SODIUM: 134 meq/L — AB (ref 136–145)
TOTAL PROTEIN: 7.9 g/dL (ref 6.4–8.3)

## 2015-06-08 LAB — CBC WITH DIFFERENTIAL/PLATELET
BASO%: 0.8 % (ref 0.0–2.0)
BASOS ABS: 0.1 10*3/uL (ref 0.0–0.1)
EOS ABS: 0.1 10*3/uL (ref 0.0–0.5)
EOS%: 1.5 % (ref 0.0–7.0)
HEMATOCRIT: 30.6 % — AB (ref 38.4–49.9)
HEMOGLOBIN: 9.8 g/dL — AB (ref 13.0–17.1)
LYMPH#: 1.8 10*3/uL (ref 0.9–3.3)
LYMPH%: 19.5 % (ref 14.0–49.0)
MCH: 28.6 pg (ref 27.2–33.4)
MCHC: 32.1 g/dL (ref 32.0–36.0)
MCV: 89 fL (ref 79.3–98.0)
MONO#: 1 10*3/uL — AB (ref 0.1–0.9)
MONO%: 10.5 % (ref 0.0–14.0)
NEUT%: 67.7 % (ref 39.0–75.0)
NEUTROS ABS: 6.2 10*3/uL (ref 1.5–6.5)
PLATELETS: 325 10*3/uL (ref 140–400)
RBC: 3.44 10*6/uL — ABNORMAL LOW (ref 4.20–5.82)
RDW: 16.6 % — AB (ref 11.0–14.6)
WBC: 9.2 10*3/uL (ref 4.0–10.3)

## 2015-06-08 MED ORDER — SODIUM CHLORIDE 0.9 % IV SOLN
Freq: Once | INTRAVENOUS | Status: AC
  Administered 2015-06-08: 11:00:00 via INTRAVENOUS

## 2015-06-08 MED ORDER — HEPARIN SOD (PORK) LOCK FLUSH 100 UNIT/ML IV SOLN
500.0000 [IU] | Freq: Once | INTRAVENOUS | Status: AC | PRN
  Administered 2015-06-08: 500 [IU]
  Filled 2015-06-08: qty 5

## 2015-06-08 MED ORDER — SODIUM CHLORIDE 0.9 % IV SOLN
240.0000 mg | Freq: Once | INTRAVENOUS | Status: AC
  Administered 2015-06-08: 240 mg via INTRAVENOUS
  Filled 2015-06-08: qty 24

## 2015-06-08 MED ORDER — OXYCODONE-ACETAMINOPHEN 5-325 MG PO TABS: 1.0000 | ORAL_TABLET | ORAL | Status: DC | PRN

## 2015-06-08 MED ORDER — SODIUM CHLORIDE 0.9 % IJ SOLN
10.0000 mL | INTRAMUSCULAR | Status: DC | PRN
  Administered 2015-06-08: 10 mL
  Filled 2015-06-08: qty 10

## 2015-06-08 NOTE — Patient Instructions (Signed)
[facility] Discharge Instructions for Patients Receiving Chemotherapy  Today you received the following chemotherapy agents nivolumab  To help prevent nausea and vomiting after your treatment, we encourage you to take your nausea medication    If you develop nausea and vomiting that is not controlled by your nausea medication, call the clinic.   BELOW ARE SYMPTOMS THAT SHOULD BE REPORTED IMMEDIATELY:  *FEVER GREATER THAN 100.5 F  *CHILLS WITH OR WITHOUT FEVER  NAUSEA AND VOMITING THAT IS NOT CONTROLLED WITH YOUR NAUSEA MEDICATION  *UNUSUAL SHORTNESS OF BREATH  *UNUSUAL BRUISING OR BLEEDING  TENDERNESS IN MOUTH AND THROAT WITH OR WITHOUT PRESENCE OF ULCERS  *URINARY PROBLEMS  *BOWEL PROBLEMS  UNUSUAL RASH Items with * indicate a potential emergency and should be followed up as soon as possible.  Feel free to call the clinic you have any questions or concerns. The clinic phone number is [phone].  Please show the [facility] at check-in to the Emergency Department and triage nurse.

## 2015-06-08 NOTE — Telephone Encounter (Signed)
Gave and printed appt sched and avs fo rpt; for NOV  °

## 2015-06-08 NOTE — Progress Notes (Signed)
Per Santiago Glad LPN w/ Sharene Butters NP, okay to tx pt w/ creatinine 3.2

## 2015-06-08 NOTE — Progress Notes (Signed)
[place] [phone]   [phone]  OFFICE PROGRESS NOTE  [name], [name], MD [place] [address]  DIAGNOSIS: Stage IV (T2b, N2, M1b) non-small cell lung cancer, adenocarcinoma with negative EGFR mutation and negative gene translocation presented with a large right hilar mass as well as mediastinal lymphadenopathy and metastatic disease to the liver, bone and pancreas diagnosed in February 2016.  PRIOR THERAPY: Systemic chemotherapy with carboplatin for AUC of 5 and Alimta 500 MG/M2 every 3 weeks. Status post 6 cycles.  CURRENT THERAPY: Immunotherapy with Nivolumab 3 MG/KG every 2 weeks, status post 4 cycles.  INTERVAL HISTORY: Francisco Love 54 y.o. male returns to the clinic today for follow-up visit, accompanied by his brother in law. He is due for cycle 5 of nivolumab today. He tolerates this well with few complaints. He denies fevers or chills. He uses compazine infrequently for nausea. He uses miralax and senna PRN for constipation due to narcotic use. He is taking percocet PRN for his lower back pain, with last small refill given at [facility], having run out of the med as of today. He is eating decently, and supplements with Ensure. He is on 2LO2 continuously and does have shortness of breath with a mild cough. He denies chest pain or hemoptysis. He has no night sweats. He denies any bleeding issues.He is eager to proceed with bone biopsy at [facility] to rule out prostate primary versus metastatic disease, scheduled for 10/20.   MEDICAL HISTORY: Past Medical History  Diagnosis Date  . Shortness of breath dyspnea   Stage IV (T2b, N2, M1b) non-small cell lung cancer  ALLERGIES:  has No Known Allergies.  MEDICATIONS:  Current Outpatient Prescriptions  Medication Sig Dispense Refill  . acetaminophen (TYLENOL) 325 MG tablet Take 2 tablets (650 mg total) by mouth every 6 (six) hours as needed for mild pain (or Fever >/=  101). (Patient not taking: Reported on 05/11/2015)    . albuterol (PROVENTIL) (2.5 MG/3ML) 0.083% nebulizer solution Take 3 mLs (2.5 mg total) by nebulization every 2 (two) hours as needed for wheezing. 75 mL 12  . AMITIZA 24 MCG capsule     . diphenhydrAMINE (SOMINEX) 25 MG tablet Take 25 mg by mouth as needed for itching or sleep.    . feeding supplement, ENSURE COMPLETE, (ENSURE COMPLETE) LIQD Take 237 mLs by mouth 3 (three) times daily between meals.    . lidocaine-prilocaine (EMLA) cream Apply 1 application topically as needed. Apply small amount  to port site 1.5 hours prior to chemotherapy. Do not rub in cream . Cover with cling wrap 30 g 0  . mirtazapine (REMERON) 15 MG tablet Take 1 tablet (15 mg total) by mouth at bedtime.    Marland Kitchen oxyCODONE-acetaminophen (PERCOCET/ROXICET) 5-325 MG tablet Take 1 tablet by mouth every 4 (four) hours as needed for severe pain. 60 tablet 0  . polyethylene glycol (MIRALAX / GLYCOLAX) packet Take 17 g by mouth daily.    . prochlorperazine (COMPAZINE) 10 MG tablet Take 1 tablet (10 mg total) by mouth every 6 (six) hours as needed for nausea or vomiting. (Patient not taking: Reported on 05/11/2015) 30 tablet 1  . senna-docusate (SENOKOT-S) 8.6-50 MG per tablet Take 1 tablet by mouth daily.    . tamsulosin (FLOMAX) 0.4 MG CAPS capsule      No current facility-administered medications for this visit.    SURGICAL HISTORY:  Past Surgical History  Procedure Laterality Date  .  Appendectomy    . Video bronchoscopy N/A 09/21/2014    Procedure: VIDEO BRONCHOSCOPY WITH FLUORO;  Surgeon: Rigoberto Noel, MD;  Location: [place];  Service: Cardiopulmonary;  Laterality: N/A;  . Video bronchoscopy with endobronchial ultrasound N/A 09/23/2014    Procedure: VIDEO BRONCHOSCOPY WITH ENDOBRONCHIAL ULTRASOUND;  Surgeon: Rigoberto Noel, MD;  Location: [place];  Service: Pulmonary;  Laterality: N/A;    REVIEW OF SYSTEMS:  A comprehensive review of systems was negative except for:  Constitutional: positive for fatigue Respiratory: positive for cough and dyspnea on exertion Musculoskeletal: positive for bone pain and muscle weakness   PHYSICAL EXAMINATION: General appearance: alert, cooperative, fatigued and no distress Head: Normocephalic, without obvious abnormality, atraumatic Neck: no adenopathy, no JVD, supple, symmetrical, trachea midline and thyroid not enlarged, symmetric, no tenderness/mass/nodules Lymph nodes: Cervical, supraclavicular, and axillary nodes normal. Resp: clear to auscultation bilaterally Back: symmetric, no curvature. ROM normal. No CVA tenderness. Cardio: regular rate and rhythm, S1, S2 normal, no murmur, click, rub or gallop GI: soft, non-tender; bowel sounds normal; no masses,  no organomegaly Extremities: extremities normal, atraumatic, no cyanosis or edema  ECOG PERFORMANCE STATUS: 2 - Symptomatic, <50% confined to bed  There were no vitals taken for this visit.  LABORATORY DATA: Lab Results  Component Value Date   WBC 9.2 06/08/2015   HGB 9.8* 06/08/2015   HCT 30.6* 06/08/2015   MCV 89.0 06/08/2015   PLT 325 06/08/2015      Chemistry      Component Value Date/Time   NA 134* 06/08/2015 0801   NA 135 10/25/2014 0845   K 5.0 06/08/2015 0801   K 5.3* 10/25/2014 0845   CL 108 10/25/2014 0845   CO2 17* 06/08/2015 0801   CO2 19 10/25/2014 0845   BUN 42.4* 06/08/2015 0801   BUN 22 10/25/2014 0845   CREATININE 3.2* 06/08/2015 0801   CREATININE 1.03 10/25/2014 0845      Component Value Date/Time   CALCIUM 8.9 06/08/2015 0801   CALCIUM 8.6 10/25/2014 0845   ALKPHOS 129 06/08/2015 0801   ALKPHOS 882* 09/20/2014 1120   AST 16 06/08/2015 0801   AST 29 09/20/2014 1120   ALT 20 06/08/2015 0801   ALT 34 09/20/2014 1120   BILITOT <0.30 06/08/2015 0801   BILITOT 0.1* 09/20/2014 1120       RADIOGRAPHIC STUDIES: Ct  Chest, Abdomen Pelvis Wo Contrast  06/06/2015   COMPARISON:  03/11/2015. FINDINGS:   CT CHEST FINDINGS  Mediastinum/Lymph Nodes: Right IJ Port-A-Cath terminates in the right atrium. Partially calcified mediastinal adenopathy measures up to 3.0 cm in short axis in the right paratracheal station (series 2, image 16), previously 4.2 cm. Subcarinal lymph node is stable, 1.5 cm. Right hilar adenopathy is difficult to accurately measure without IV contrast but measures approximately 1.7 cm (image 28), stable. No axillary adenopathy. Heart size normal. Coronary artery calcification. No pericardial effusion. Lungs/Pleura: Tiny bilateral effusions. Biapical pleural parenchymal scarring with mild centrilobular and paraseptal emphysema. A few scattered nodular densities measure up to 5 mm in the right lower lobe (series 4, image 33). The measured nodule is not definitely seen on 03/11/2015. Airway is unremarkable. Musculoskeletal: Diffuse sclerotic osseous metastatic disease is grossly stable. Vertebral body height is maintained.   CT ABDOMEN PELVIS FINDINGS Hepatobiliary: Liver and gallbladder are unremarkable. Bile duct measures approximately 9 mm, stable. Pancreas: Partially calcified cystic lesion in the head of the pancreas is suboptimally visualized without IV contrast, making accurate measurement difficult. Pancreatic duct is visualized but  not dilated. No gland atrophy. Spleen: Negative. Adrenals/Urinary Tract: Adrenal glands and right kidney are unremarkable. Left kidney appears slightly atrophic. Ureters are decompressed. Bladder is unremarkable. Stomach/Bowel: There may be a tiny hiatal hernia. Stomach is decompressed. Mild prominence of small bowel loops, without evidence of obstruction. Appendix is not well visualized. Stool is seen throughout the colon. Suspect rectal prolapse. Vascular/Lymphatic: Atherosclerotic calcification of the arterial vasculature without abdominal aortic aneurysm. Known severe stenosis of the left common iliac artery is poorly visualized without IV contrast. No pathologically enlarged  lymph nodes. Reproductive: Prostate is visualized. Other: Small bilateral inguinal hernias contain fat, left greater than right. No free fluid. Mesenteries and peritoneum are otherwise unremarkable. Musculoskeletal: Diffuse sclerotic metastatic disease is grossly unchanged. Severe bilateral hip osteoarthritis. Question underlying avascular necrosis bilaterally with associated collapse in both femoral heads. IMPRESSION: 1. Slight decrease in size of a right paratracheal nodal mass. Mediastinal/right hilar adenopathy and osseous metastatic disease are otherwise grossly stable. 2. 5 mm right lower lobe nodule is not well-visualized on the prior exam. Continued attention on followup exams is warranted. 3. Tiny bilateral pleural effusions. 4. Partial calcified cystic lesion in the pancreatic head is suboptimally visualized without IV contrast, making accurate measurement and comparison difficult. Electronically Signed   By: Lorin Picket M.D.   On: 06/06/2015 08:25    Positron emission tomography (PET) with concurrently acquired computed tomography (CT) for attenuation correction and anatomic localization: skull base to mid-thigh DATE: 05/30/15 16:53:43  COMPARISON: Outside hospital CT of the chest abdomen, and pelvis dated 03/11/2015  FINDINGS:  Head/Neck: -    No abnormal focal radiotracer uptake -    No cervical adenopathy  Chest: -  Thyroid: No abnormal uptake -    Axillae: No adenopathy -    Breasts: No abnormal uptake  -    Lungs: There is bilateral mild dependent atelectasis, bilateral paraseptal and centrilobular emphysematous changes. Parenchymal calcifications are again seen in the left upper lobe; they are not metabolically active. -    Mediastinum/hila: The conglomerate mass described on outside CT in the upper and lower right paratracheal stations is metabolically active, and likely malignant. Subcarinal and right hilar nodes are also metabolically active. There is a mildly avid left hilar  node as well. -    Pleura: Mildly avid bilateral pleural effusions are present, and may be malignant. -    Cardiovascular: No abnormal uptake. Scattered vascular calcifications are present in the great vessels.   Abdomen/Pelvis: -    Liver: No focal abnormality -    Gallbladder: Unremarkable -    Spleen: No splenomegaly. No focal abnormalities. -    Pancreas: 2.2 cm cystic lesion with calcifications noted on prior CT is dimly visualized on on present CT; it displays no activity above background. -    Adrenal glands: Unremarkable -    Kidneys: The left kidney is mildly atrophic, but the kidneys are otherwise unremarkable.  -    GI Tract: Unremarkable -    GU Tract: Bilateral hypodense cystic/fluid-filled lesions in the scrotum described on recent CT are not metabolically active, and may represent cyst or hydrocele. -    Adenopathy: None Moderate vascular calcifications are again seen in the aorta and iliac arteries. A small left fat-containing inguinal hernia is again seen.  MUSCULOSKELETAL: -    Diffuse sclerotic bone metastases involving the axial and proximal appendicular skeleton are again seen. However, they are not metabolically active. -    Mild bilateral uptake around the shoulders is present. It  is likely degenerative in nature. Degenerative changes are seen in the hips as well.  IMPRESSION: -    Conglomerate mass in the right paratracheal region with additional mildly avid subcarinal and bilateral (right more avid than left) hilar nodes are likely malignant. There is no additional pulmonary lesion. -    Diffuse sclerotic bone metastases are not FDG avid. Note that if there is suspicion for both prostate and lung cancer, prostate cancer frequently produces non-avid metastases. -    Cystic lesion in the pancreatic head is poorly characterized on noncontrast CT; it is not metabolically active. -    Small bilateral pleural effusions are mildly avid and may be  malignant.    ASSESSMENT AND PLAN: This is a very pleasant 54 years old African-American male with metastatic non-small cell lung cancer, adenocarcinoma status post induction chemotherapy with carboplatin and Alimta with initial good response followed by disease progression.  The patient is currently on second line treatment with immunotherapy with Nivolumab status post 4 cycles. He continues to tolerate treatment well with few side effects.  Repeat staging CTs of the chest, abdomen and pelvis without contrast on 10/17  And PET scan on 10/10 showed good response to the immunotherapy.  He is to continue present therapy He is scheduled for biopsy on 10/20 of the bone lesions. If it is indeed, prostate cancer he could be treated easier with the use of hormone therapy. For now we will continue the chemo as scheduled.  He will return in 2 weeks for evaluation and symptom management prior to the start of cycle #6, and every 2 weeks with Dr. Julien Nordmann A refill of Percocet 5/325, q 4 hrs prn  #60 with no refills was given to the patient to manage his neoplasm related pain. He was advised to call immediately if he has any concerning symptoms in the interval. The patient voices understanding of current disease status and treatment options and is in agreement with the current care plan.   Patient was discussed with Dr. Julien Nordmann who was present for the visit    Rondel Jumbo, PA-C  06/08/2015  ADDENDUM: Hematology/Oncology Attending: I had a face to face encounter with the patient. I recommended his care plan. This is a very pleasant 54 years old African-American male with metastatic non-small cell lung cancer, adenocarcinoma who is currently undergoing treatment with immunotherapy with Nivolumab status post 4 cycles. He has been treating his treatment fairly well with no significant adverse effects. The recent CT scan of the chest, abdomen and pelvis showed no evidence for disease progression. I discussed  the scan results with the patient and his family member today. I recommended for the patient to continue his current treatment with immunotherapy as a scheduled. He will receive cycle #5 today. The patient is currently undergoing evaluation for questionable metastatic prostate cancer at [facility] and he is scheduled for a biopsy soon. He would come back for follow-up visit in 2 weeks for reevaluation before starting cycle #6. The patient was given a refill of his pain medication today. He was advised to call immediately if he has any concerning symptoms in the interval.  Disclaimer: This note was dictated with voice recognition software. Similar sounding words can inadvertently be transcribed and may be missed upon review. Eilleen Kempf., MD 06/08/2015

## 2015-06-15 ENCOUNTER — Ambulatory Visit: Payer: Medicare Other | Admitting: Internal Medicine

## 2015-06-15 ENCOUNTER — Other Ambulatory Visit: Payer: Medicare Other

## 2015-06-22 ENCOUNTER — Ambulatory Visit (HOSPITAL_BASED_OUTPATIENT_CLINIC_OR_DEPARTMENT_OTHER): Payer: Medicare Other

## 2015-06-22 ENCOUNTER — Other Ambulatory Visit (HOSPITAL_BASED_OUTPATIENT_CLINIC_OR_DEPARTMENT_OTHER): Payer: Medicare Other

## 2015-06-22 ENCOUNTER — Encounter: Payer: Self-pay | Admitting: Physician Assistant

## 2015-06-22 ENCOUNTER — Ambulatory Visit (HOSPITAL_BASED_OUTPATIENT_CLINIC_OR_DEPARTMENT_OTHER): Payer: Medicare Other | Admitting: Physician Assistant

## 2015-06-22 ENCOUNTER — Other Ambulatory Visit: Payer: Self-pay | Admitting: Hematology

## 2015-06-22 ENCOUNTER — Telehealth: Payer: Self-pay | Admitting: Internal Medicine

## 2015-06-22 VITALS — BP 93/70 | HR 94 | Temp 97.8°F | Resp 18 | Ht 69.0 in | Wt 138.1 lb

## 2015-06-22 DIAGNOSIS — C801 Malignant (primary) neoplasm, unspecified: Principal | ICD-10-CM

## 2015-06-22 DIAGNOSIS — C787 Secondary malignant neoplasm of liver and intrahepatic bile duct: Secondary | ICD-10-CM | POA: Diagnosis not present

## 2015-06-22 DIAGNOSIS — C7951 Secondary malignant neoplasm of bone: Secondary | ICD-10-CM | POA: Diagnosis not present

## 2015-06-22 DIAGNOSIS — Z5112 Encounter for antineoplastic immunotherapy: Secondary | ICD-10-CM | POA: Diagnosis present

## 2015-06-22 DIAGNOSIS — G893 Neoplasm related pain (acute) (chronic): Secondary | ICD-10-CM

## 2015-06-22 DIAGNOSIS — C3491 Malignant neoplasm of unspecified part of right bronchus or lung: Secondary | ICD-10-CM

## 2015-06-22 DIAGNOSIS — C7889 Secondary malignant neoplasm of other digestive organs: Secondary | ICD-10-CM

## 2015-06-22 DIAGNOSIS — C3411 Malignant neoplasm of upper lobe, right bronchus or lung: Secondary | ICD-10-CM | POA: Diagnosis present

## 2015-06-22 DIAGNOSIS — Z79899 Other long term (current) drug therapy: Secondary | ICD-10-CM

## 2015-06-22 LAB — COMPREHENSIVE METABOLIC PANEL (CC13)
ALBUMIN: 3.3 g/dL — AB (ref 3.5–5.0)
ALK PHOS: 128 U/L (ref 40–150)
ALT: 20 U/L (ref 0–55)
AST: 16 U/L (ref 5–34)
Anion Gap: 9 mEq/L (ref 3–11)
BUN: 45.7 mg/dL — AB (ref 7.0–26.0)
CALCIUM: 9.6 mg/dL (ref 8.4–10.4)
CO2: 18 mEq/L — ABNORMAL LOW (ref 22–29)
Chloride: 108 mEq/L (ref 98–109)
Creatinine: 2.9 mg/dL — ABNORMAL HIGH (ref 0.7–1.3)
EGFR: 27 mL/min/{1.73_m2} — ABNORMAL LOW (ref >90)
GLUCOSE: 97 mg/dL (ref 70–140)
POTASSIUM: 5.1 meq/L (ref 3.5–5.1)
SODIUM: 135 meq/L — AB (ref 136–145)
TOTAL PROTEIN: 8.6 g/dL — AB (ref 6.4–8.3)

## 2015-06-22 LAB — CBC WITH DIFFERENTIAL/PLATELET
BASO%: 0.9 % (ref 0.0–2.0)
BASOS ABS: 0.1 10*3/uL (ref 0.0–0.1)
EOS%: 1.7 % (ref 0.0–7.0)
Eosinophils Absolute: 0.2 10*3/uL (ref 0.0–0.5)
HEMATOCRIT: 34.1 % — AB (ref 38.4–49.9)
HEMOGLOBIN: 11 g/dL — AB (ref 13.0–17.1)
LYMPH#: 2.1 10*3/uL (ref 0.9–3.3)
LYMPH%: 22.3 % (ref 14.0–49.0)
MCH: 28 pg (ref 27.2–33.4)
MCHC: 32.1 g/dL (ref 32.0–36.0)
MCV: 87.2 fL (ref 79.3–98.0)
MONO#: 0.8 10*3/uL (ref 0.1–0.9)
MONO%: 7.9 % (ref 0.0–14.0)
NEUT#: 6.5 10*3/uL (ref 1.5–6.5)
NEUT%: 67.2 % (ref 39.0–75.0)
PLATELETS: 334 10*3/uL (ref 140–400)
RBC: 3.91 10*6/uL — ABNORMAL LOW (ref 4.20–5.82)
RDW: 16.4 % — AB (ref 11.0–14.6)
WBC: 9.6 10*3/uL (ref 4.0–10.3)

## 2015-06-22 LAB — TSH CHCC: TSH: 2.265 m(IU)/L (ref 0.320–4.118)

## 2015-06-22 MED ORDER — OXYCODONE-ACETAMINOPHEN 5-325 MG PO TABS
ORAL_TABLET | ORAL | Status: AC
  Filled 2015-06-22: qty 1

## 2015-06-22 MED ORDER — HEPARIN SOD (PORK) LOCK FLUSH 100 UNIT/ML IV SOLN
500.0000 [IU] | Freq: Once | INTRAVENOUS | Status: AC | PRN
  Administered 2015-06-22: 500 [IU]
  Filled 2015-06-22: qty 5

## 2015-06-22 MED ORDER — OXYCODONE-ACETAMINOPHEN 5-325 MG PO TABS
1.0000 | ORAL_TABLET | Freq: Once | ORAL | Status: AC
  Administered 2015-06-22: 1 via ORAL

## 2015-06-22 MED ORDER — SODIUM CHLORIDE 0.9 % IV SOLN
Freq: Once | INTRAVENOUS | Status: AC
  Administered 2015-06-22: 10:00:00 via INTRAVENOUS

## 2015-06-22 MED ORDER — SODIUM CHLORIDE 0.9 % IV SOLN
240.0000 mg | Freq: Once | INTRAVENOUS | Status: AC
  Administered 2015-06-22: 240 mg via INTRAVENOUS
  Filled 2015-06-22: qty 24

## 2015-06-22 MED ORDER — OXYCODONE-ACETAMINOPHEN 5-325 MG PO TABS: 1.0000 | ORAL_TABLET | ORAL | Status: DC | PRN

## 2015-06-22 MED ORDER — SODIUM CHLORIDE 0.9 % IJ SOLN
10.0000 mL | INTRAMUSCULAR | Status: DC | PRN
  Administered 2015-06-22: 10 mL
  Filled 2015-06-22: qty 10

## 2015-06-22 NOTE — Progress Notes (Signed)
[place] [phone]   Fax:([phone]  OFFICE PROGRESS NOTE  [name], [name], MD [place] [address]  DIAGNOSIS: Stage IV (T2b, N2, M1b) non-small cell lung cancer, adenocarcinoma with negative EGFR mutation and negative gene translocation presented with a large right hilar mass as well as mediastinal lymphadenopathy and metastatic disease to the liver, bone and pancreas diagnosed in February 2016.  PRIOR THERAPY: Systemic chemotherapy with carboplatin for AUC of 5 and Alimta 500 MG/M2 every 3 weeks. Status post 6 cycles.  CURRENT THERAPY: Immunotherapy with Nivolumab 3 MG/KG every 2 weeks, status post 6 cycles.  INTERVAL HISTORY: Francisco Love 54 y.o. male returns to the clinic today for follow-up visit, accompanied by his brother in law. He is due for cycle 6 of nivolumab today. He tolerates this well without significant complaints. He denies fevers or chills. He uses compazine infrequently for nausea. He uses miralax and senna PRN for constipation due to narcotic use. He is eating decently, and supplements with Ensure. He is on 2LO2 continuously and does have shortness of breath with a mild cough. He denies chest pain or hemoptysis. He has no night sweats. He denies any bleeding issues. He had a bronchoscopy and bone biopsy on 10/20 which were non diagnostic. He is scheduled for prostate biopsy at [facility]. He is eager to proceed with prostate biopsy at [facility] to rule out prostate primary versus metastatic disease, scheduled for 11/18   MEDICAL HISTORY: Past Medical History  Diagnosis Date  . Shortness of breath dyspnea   Stage IV (T2b, N2, M1b) non-small cell lung cancer  ALLERGIES:  has No Known Allergies.  MEDICATIONS:  Current Outpatient Prescriptions  Medication Sig Dispense Refill  . acetaminophen (TYLENOL) 325 MG tablet Take 2 tablets (650 mg total) by mouth every 6 (six) hours as needed for mild pain (or Fever  >/= 101). (Patient not taking: Reported on 05/11/2015)    . albuterol (PROVENTIL) (2.5 MG/3ML) 0.083% nebulizer solution Take 3 mLs (2.5 mg total) by nebulization every 2 (two) hours as needed for wheezing. 75 mL 12  . AMITIZA 24 MCG capsule     . diphenhydrAMINE (SOMINEX) 25 MG tablet Take 25 mg by mouth as needed for itching or sleep.    . feeding supplement, ENSURE COMPLETE, (ENSURE COMPLETE) LIQD Take 237 mLs by mouth 3 (three) times daily between meals.    . lidocaine-prilocaine (EMLA) cream Apply 1 application topically as needed. Apply small amount  to port site 1.5 hours prior to chemotherapy. Do not rub in cream . Cover with cling wrap 30 g 0  . mirtazapine (REMERON) 15 MG tablet Take 1 tablet (15 mg total) by mouth at bedtime.    Marland Kitchen oxyCODONE-acetaminophen (PERCOCET/ROXICET) 5-325 MG tablet Take 1 tablet by mouth every 4 (four) hours as needed for severe pain. 60 tablet 0  . polyethylene glycol (MIRALAX / GLYCOLAX) packet Take 17 g by mouth daily.    . prochlorperazine (COMPAZINE) 10 MG tablet Take 1 tablet (10 mg total) by mouth every 6 (six) hours as needed for nausea or vomiting. (Patient not taking: Reported on 05/11/2015) 30 tablet 1  . senna-docusate (SENOKOT-S) 8.6-50 MG per tablet Take 1 tablet by mouth daily.    . tamsulosin (FLOMAX) 0.4 MG CAPS capsule      No current facility-administered medications for this visit.    SURGICAL HISTORY:  Past Surgical History  Procedure Laterality Date  . Appendectomy    .  Video bronchoscopy N/A 09/21/2014    Procedure: VIDEO BRONCHOSCOPY WITH FLUORO;  Surgeon: Rigoberto Noel, MD;  Location: [place];  Service: Cardiopulmonary;  Laterality: N/A;  . Video bronchoscopy with endobronchial ultrasound N/A 09/23/2014    Procedure: VIDEO BRONCHOSCOPY WITH ENDOBRONCHIAL ULTRASOUND;  Surgeon: Rigoberto Noel, MD;  Location: [place];  Service: Pulmonary;  Laterality: N/A;    REVIEW OF SYSTEMS:  A comprehensive review of systems was negative except for:  Constitutional: positive for fatigue Respiratory: positive for cough and dyspnea on exertion Musculoskeletal: positive for bone pain and muscle weakness   PHYSICAL EXAMINATION: General appearance: alert, cooperative, fatigued and no distress Head: Normocephalic, without obvious abnormality, atraumatic Neck: no adenopathy, no JVD, supple, symmetrical, trachea midline and thyroid not enlarged, symmetric, no tenderness/mass/nodules Lymph nodes: Cervical, supraclavicular, and axillary nodes normal. Resp: clear to auscultation bilaterally Back: symmetric, no curvature. ROM normal. No CVA tenderness. Cardio: regular rate and rhythm, S1, S2 normal, no murmur, click, rub or gallop GI: soft, non-tender; bowel sounds normal; no masses,  no organomegaly Extremities: extremities normal, atraumatic, no cyanosis or edema  ECOG PERFORMANCE STATUS: 2 - Symptomatic, <50% confined to bed  Blood pressure 93/70, pulse 94, temperature 97.8 F (36.6 C), temperature source Oral, resp. rate 18, height _0  (1.753 m), weight 138 lb 1.6 oz (62.642 kg), SpO2 100 %.  LABORATORY DATA: CBC Latest Ref Rng 06/22/2015 06/08/2015 05/25/2015  WBC 4.0 - 10.3 10e3/uL 9.6 9.2 8.6  Hemoglobin 13.0 - 17.1 g/dL 11.0(L) 9.8(L) 10.2(L)  Hematocrit 38.4 - 49.9 % 34.1(L) 30.6(L) 31.8(L)  Platelets 140 - 400 10e3/uL 334 325 281     CMP Latest Ref Rng 06/22/2015 06/08/2015 05/25/2015  Glucose 70 - 140 mg/dl 97 90 101  BUN 7.0 - 26.0 mg/dL 45.7(H) 42.4(H) 41.8(H)  Creatinine 0.7 - 1.3 mg/dL 2.9(H) 3.2(HH) 3.2(HH)  Sodium 136 - 145 mEq/L 135(L) 134(L) 134(L)  Potassium 3.5 - 5.1 mEq/L 5.1 5.0 6.0(H)  Chloride 96 - 112 mmol/L - - -  CO2 22 - 29 mEq/L 18(L) 17(L) 19(L)  Calcium 8.4 - 10.4 mg/dL 9.6 8.9 9.2  Total Protein 6.4 - 8.3 g/dL 8.6(H) 7.9 8.2  Total Bilirubin 0.20 - 1.20 mg/dL <0.30 <0.30 <0.30  Alkaline Phos 40 - 150 U/L 128 129 143  AST 5 - 34 U/L _1 ALT 0 - 55 U/L 20 20 35     RADIOGRAPHIC STUDIES: No new  imaging     ASSESSMENT AND PLAN: This is a very pleasant 55 years old African-American male with metastatic non-small cell lung cancer, adenocarcinoma status post induction chemotherapy with carboplatin and Alimta with initial good response followed by disease progression.  The patient is currently on second line treatment with immunotherapy with Nivolumab status post 5 cycles. He continues to tolerate treatment well with few side effects.  Repeat staging CTs of the chest, abdomen and pelvis without contrast on 10/17  and PET scan on 10/10 showed good response to the immunotherapy with no evidence of disease progression.  He is to continue present therapy He had a bronchoscopy and bone biopsy at [facility] on 10/20 which were non diagnostic.  He is scheduled for prostate biopsy at [facility]. He has an appointment with Dr. Delena Bali on 11/18 prior to his biopsy.  He will return in 2 weeks for evaluation and symptom management prior to the start of cycle #7 and every 2 weeks with Dr. Julien Nordmann A refill of Percocet 5/325, q 4 hrs prn  #60 with no  refills was given to the patient to manage his neoplasm related pain. He was advised to call immediately if he has any concerning symptoms in the interval. The patient voices understanding of current disease status and treatment options and is in agreement with the current care plan.   Patient was discussed with Dr. Julien Nordmann who was present for the visit    [facility], PA-C  06/22/2015

## 2015-06-22 NOTE — Telephone Encounter (Signed)
Gave and printed appt sched and avs for pt for NOV and DEC

## 2015-06-22 NOTE — Patient Instructions (Signed)
[facility] Discharge Instructions for Patients Receiving Chemotherapy  Today you received the following chemotherapy agents Opdivo.  To help prevent nausea and vomiting after your treatment, we encourage you to take your nausea medication as directed.   If you develop nausea and vomiting that is not controlled by your nausea medication, call the clinic.   BELOW ARE SYMPTOMS THAT SHOULD BE REPORTED IMMEDIATELY:  *FEVER GREATER THAN 100.5 F  *CHILLS WITH OR WITHOUT FEVER  NAUSEA AND VOMITING THAT IS NOT CONTROLLED WITH YOUR NAUSEA MEDICATION  *UNUSUAL SHORTNESS OF BREATH  *UNUSUAL BRUISING OR BLEEDING  TENDERNESS IN MOUTH AND THROAT WITH OR WITHOUT PRESENCE OF ULCERS  *URINARY PROBLEMS  *BOWEL PROBLEMS  UNUSUAL RASH Items with * indicate a potential emergency and should be followed up as soon as possible.  Feel free to call the clinic you have any questions or concerns. The clinic phone number is [phone].  Please show the CHEMO ALERT CARD at check-in to the Emergency Department and triage nurse.    

## 2015-07-06 ENCOUNTER — Ambulatory Visit: Payer: Medicaid Other

## 2015-07-06 ENCOUNTER — Other Ambulatory Visit (HOSPITAL_BASED_OUTPATIENT_CLINIC_OR_DEPARTMENT_OTHER): Payer: Medicare Other

## 2015-07-06 ENCOUNTER — Ambulatory Visit (HOSPITAL_BASED_OUTPATIENT_CLINIC_OR_DEPARTMENT_OTHER): Payer: Medicare Other | Admitting: Physician Assistant

## 2015-07-06 ENCOUNTER — Ambulatory Visit (HOSPITAL_BASED_OUTPATIENT_CLINIC_OR_DEPARTMENT_OTHER): Payer: Medicare Other

## 2015-07-06 VITALS — BP 112/67 | HR 84 | Temp 97.8°F | Resp 18 | Ht 69.0 in | Wt 138.8 lb

## 2015-07-06 DIAGNOSIS — C7951 Secondary malignant neoplasm of bone: Secondary | ICD-10-CM

## 2015-07-06 DIAGNOSIS — Z5112 Encounter for antineoplastic immunotherapy: Secondary | ICD-10-CM

## 2015-07-06 DIAGNOSIS — C801 Malignant (primary) neoplasm, unspecified: Principal | ICD-10-CM

## 2015-07-06 DIAGNOSIS — R52 Pain, unspecified: Secondary | ICD-10-CM | POA: Diagnosis not present

## 2015-07-06 DIAGNOSIS — C7889 Secondary malignant neoplasm of other digestive organs: Secondary | ICD-10-CM

## 2015-07-06 DIAGNOSIS — C787 Secondary malignant neoplasm of liver and intrahepatic bile duct: Secondary | ICD-10-CM

## 2015-07-06 DIAGNOSIS — C3411 Malignant neoplasm of upper lobe, right bronchus or lung: Secondary | ICD-10-CM | POA: Diagnosis present

## 2015-07-06 DIAGNOSIS — E875 Hyperkalemia: Secondary | ICD-10-CM

## 2015-07-06 DIAGNOSIS — G893 Neoplasm related pain (acute) (chronic): Secondary | ICD-10-CM

## 2015-07-06 DIAGNOSIS — C3491 Malignant neoplasm of unspecified part of right bronchus or lung: Secondary | ICD-10-CM

## 2015-07-06 LAB — CBC WITH DIFFERENTIAL/PLATELET
BASO%: 0.6 % (ref 0.0–2.0)
Basophils Absolute: 0.1 10*3/uL (ref 0.0–0.1)
EOS%: 2.2 % (ref 0.0–7.0)
Eosinophils Absolute: 0.2 10*3/uL (ref 0.0–0.5)
HEMATOCRIT: 31.5 % — AB (ref 38.4–49.9)
HGB: 9.9 g/dL — ABNORMAL LOW (ref 13.0–17.1)
LYMPH#: 2.1 10*3/uL (ref 0.9–3.3)
LYMPH%: 22 % (ref 14.0–49.0)
MCH: 27.4 pg (ref 27.2–33.4)
MCHC: 31.5 g/dL — AB (ref 32.0–36.0)
MCV: 87 fL (ref 79.3–98.0)
MONO#: 0.9 10*3/uL (ref 0.1–0.9)
MONO%: 9.9 % (ref 0.0–14.0)
NEUT%: 65.3 % (ref 39.0–75.0)
NEUTROS ABS: 6.3 10*3/uL (ref 1.5–6.5)
Platelets: 302 10*3/uL (ref 140–400)
RBC: 3.63 10*6/uL — ABNORMAL LOW (ref 4.20–5.82)
RDW: 16.6 % — AB (ref 11.0–14.6)
WBC: 9.6 10*3/uL (ref 4.0–10.3)

## 2015-07-06 LAB — BASIC METABOLIC PANEL (CC13)
Anion Gap: 11 mEq/L (ref 3–11)
BUN: 53.9 mg/dL — ABNORMAL HIGH (ref 7.0–26.0)
CALCIUM: 8.8 mg/dL (ref 8.4–10.4)
CO2: 14 meq/L — AB (ref 22–29)
CREATININE: 3 mg/dL — AB (ref 0.7–1.3)
Chloride: 110 mEq/L — ABNORMAL HIGH (ref 98–109)
EGFR: 26 mL/min/{1.73_m2} — ABNORMAL LOW (ref >90)
GLUCOSE: 111 mg/dL (ref 70–140)
Potassium: 5.2 mEq/L — ABNORMAL HIGH (ref 3.5–5.1)
Sodium: 135 mEq/L — ABNORMAL LOW (ref 136–145)

## 2015-07-06 LAB — COMPREHENSIVE METABOLIC PANEL (CC13)
ALT: 24 U/L (ref 0–55)
AST: 17 U/L (ref 5–34)
Albumin: 2.9 g/dL — ABNORMAL LOW (ref 3.5–5.0)
Alkaline Phosphatase: 130 U/L (ref 40–150)
Anion Gap: 9 mEq/L (ref 3–11)
BUN: 55.7 mg/dL — AB (ref 7.0–26.0)
CALCIUM: 8.9 mg/dL (ref 8.4–10.4)
CHLORIDE: 110 meq/L — AB (ref 98–109)
CO2: 17 meq/L — AB (ref 22–29)
CREATININE: 3.2 mg/dL — AB (ref 0.7–1.3)
EGFR: 24 mL/min/{1.73_m2} — ABNORMAL LOW (ref >90)
Glucose: 90 mg/dl (ref 70–140)
Potassium: 6 mEq/L — ABNORMAL HIGH (ref 3.5–5.1)
Sodium: 135 mEq/L — ABNORMAL LOW (ref 136–145)
Total Bilirubin: <0.3 mg/dL (ref 0.20–1.20)
Total Protein: 7.8 g/dL (ref 6.4–8.3)

## 2015-07-06 MED ORDER — SODIUM CHLORIDE 0.9 % IV SOLN
240.0000 mg | Freq: Once | INTRAVENOUS | Status: AC
  Administered 2015-07-06: 240 mg via INTRAVENOUS
  Filled 2015-07-06: qty 20

## 2015-07-06 MED ORDER — SODIUM CHLORIDE 0.9 % IV SOLN
Freq: Once | INTRAVENOUS | Status: AC
  Administered 2015-07-06: 10:00:00 via INTRAVENOUS

## 2015-07-06 MED ORDER — OXYCODONE-ACETAMINOPHEN 5-325 MG PO TABS: 1.0000 | ORAL_TABLET | ORAL | Status: DC | PRN

## 2015-07-06 MED ORDER — HEPARIN SOD (PORK) LOCK FLUSH 100 UNIT/ML IV SOLN
500.0000 [IU] | Freq: Once | INTRAVENOUS | Status: AC | PRN
  Administered 2015-07-06: 500 [IU]
  Filled 2015-07-06: qty 5

## 2015-07-06 MED ORDER — SODIUM CHLORIDE 0.9 % IJ SOLN
10.0000 mL | INTRAMUSCULAR | Status: DC | PRN
  Administered 2015-07-06: 10 mL
  Filled 2015-07-06: qty 10

## 2015-07-06 NOTE — Progress Notes (Signed)
[place] [phone]   [phone]  OFFICE PROGRESS NOTE  [name], [name], MD [place] [address]  DIAGNOSIS: Stage IV (T2b, N2, M1b) non-small cell lung cancer, adenocarcinoma with negative EGFR mutation and negative gene translocation presented with a large right hilar mass as well as mediastinal lymphadenopathy and metastatic disease to the liver, bone and pancreas diagnosed in February 2016.  PRIOR THERAPY: Systemic chemotherapy with carboplatin for AUC of 5 and Alimta 500 MG/M2 every 3 weeks. Status post 6 cycles.  CURRENT THERAPY: Immunotherapy with Nivolumab 3 MG/KG every 2 weeks, status post 7 cycles.  INTERVAL HISTORY: Francisco Love 54 y.o. male returns to the clinic today for follow-up visit, accompanied by his brother in law. He is due for cycle 7 of nivolumab today. He tolerates this well without significant complaints. He denies fevers or chills. He uses compazine infrequently for nausea. He uses miralax and senna PRN for constipation due to narcotic use. He is eating decently, and supplements with Ensure. He is on 2LO2 continuously and does have shortness of breath with a mild cough. He denies chest pain or hemoptysis. He has no night sweats. He denies any bleeding issues.   He is scheduled for prostate biopsy at [facility] to rule out prostate primary versus metastatic disease. He has an appointment with MD at [facility] on 11/18  MEDICAL HISTORY: Past Medical History  Diagnosis Date  . Shortness of breath dyspnea   Stage IV (T2b, N2, M1b) non-small cell lung cancer  ALLERGIES:  has No Known Allergies.  MEDICATIONS:  Current Outpatient Prescriptions  Medication Sig Dispense Refill  . acetaminophen (TYLENOL) 325 MG tablet Take 2 tablets (650 mg total) by mouth every 6 (six) hours as needed for mild pain (or Fever >/= 101). (Patient not taking: Reported on 05/11/2015)    . albuterol (PROVENTIL) (2.5 MG/3ML) 0.083%  nebulizer solution Take 3 mLs (2.5 mg total) by nebulization every 2 (two) hours as needed for wheezing. 75 mL 12  . AMITIZA 24 MCG capsule     . diphenhydrAMINE (SOMINEX) 25 MG tablet Take 25 mg by mouth as needed for itching or sleep.    . feeding supplement, ENSURE COMPLETE, (ENSURE COMPLETE) LIQD Take 237 mLs by mouth 3 (three) times daily between meals.    . lidocaine-prilocaine (EMLA) cream Apply 1 application topically as needed. Apply small amount  to port site 1.5 hours prior to chemotherapy. Do not rub in cream . Cover with cling wrap 30 g 0  . mirtazapine (REMERON) 15 MG tablet Take 1 tablet (15 mg total) by mouth at bedtime.    Marland Kitchen oxyCODONE-acetaminophen (PERCOCET/ROXICET) 5-325 MG tablet Take 1 tablet by mouth every 4 (four) hours as needed for severe pain. 60 tablet 0  . polyethylene glycol (MIRALAX / GLYCOLAX) packet Take 17 g by mouth daily.    . prochlorperazine (COMPAZINE) 10 MG tablet Take 1 tablet (10 mg total) by mouth every 6 (six) hours as needed for nausea or vomiting. (Patient not taking: Reported on 05/11/2015) 30 tablet 1  . senna-docusate (SENOKOT-S) 8.6-50 MG per tablet Take 1 tablet by mouth daily.    . tamsulosin (FLOMAX) 0.4 MG CAPS capsule      No current facility-administered medications for this visit.    SURGICAL HISTORY:  Past Surgical History  Procedure Laterality Date  . Appendectomy    . Video bronchoscopy N/A 09/21/2014    Procedure: VIDEO BRONCHOSCOPY WITH FLUORO;  Surgeon:  Rigoberto Noel, MD;  Location: [facility] ENDOSCOPY;  Service: Cardiopulmonary;  Laterality: N/A;  . Video bronchoscopy with endobronchial ultrasound N/A 09/23/2014    Procedure: VIDEO BRONCHOSCOPY WITH ENDOBRONCHIAL ULTRASOUND;  Surgeon: Rigoberto Noel, MD;  Location: [facility];  Service: Pulmonary;  Laterality: N/A;    REVIEW OF SYSTEMS:  A comprehensive review of systems was negative except for: Constitutional: positive for fatigue Respiratory: positive for cough and dyspnea on  exertion Musculoskeletal: positive for bone pain and muscle weakness   PHYSICAL EXAMINATION: General appearance: alert, cooperative, fatigued and no distress Head: Normocephalic, without obvious abnormality, atraumatic Neck: no adenopathy, no JVD, supple, symmetrical, trachea midline and thyroid not enlarged, symmetric, no tenderness/mass/nodules Lymph nodes: Cervical, supraclavicular, and axillary nodes normal. Resp: clear to auscultation bilaterally Back: symmetric, no curvature. ROM normal. No CVA tenderness. Cardio: regular rate and rhythm, S1, S2 normal, no murmur, click, rub or gallop GI: soft, non-tender; bowel sounds normal; no masses,  no organomegaly Extremities: extremities normal, atraumatic, no cyanosis or edema  ECOG PERFORMANCE STATUS: 2 - Symptomatic, <50% confined to bed  Blood pressure 112/67, pulse 84, temperature 97.8 F (36.6 C), temperature source Oral, resp. rate 18, height _0  (1.753 m), weight 138 lb 12.8 oz (62.959 kg), SpO2 100 %.  LABORATORY DATA: CBC Latest Ref Rng 07/06/2015 06/22/2015 06/08/2015  WBC 4.0 - 10.3 10e3/uL 9.6 9.6 9.2  Hemoglobin 13.0 - 17.1 g/dL 9.9(L) 11.0(L) 9.8(L)  Hematocrit 38.4 - 49.9 % 31.5(L) 34.1(L) 30.6(L)  Platelets 140 - 400 10e3/uL 302 334 325     CMP Latest Ref Rng 07/06/2015 06/22/2015 06/08/2015  Glucose 70 - 140 mg/dl 90 97 90  BUN 7.0 - 26.0 mg/dL 55.7(H) 45.7(H) 42.4(H)  Creatinine 0.7 - 1.3 mg/dL 3.2(HH) 2.9(H) 3.2(HH)  Sodium 136 - 145 mEq/L 135(L) 135(L) 134(L)  Potassium 3.5 - 5.1 mEq/L 6.0(H) 5.1 5.0  Chloride 96 - 112 mmol/L - - -  CO2 22 - 29 mEq/L 17(L) 18(L) 17(L)  Calcium 8.4 - 10.4 mg/dL 8.9 9.6 8.9  Total Protein 6.4 - 8.3 g/dL 7.8 8.6(H) 7.9  Total Bilirubin 0.20 - 1.20 mg/dL <0.30 <0.30 <0.30  Alkaline Phos 40 - 150 U/L 130 128 129  AST 5 - 34 U/L _1 ALT 0 - 55 U/L _2 RADIOGRAPHIC STUDIES: No new imaging     ASSESSMENT AND PLAN: This is a very pleasant 54 years old  African-American male with metastatic non-small cell lung cancer, adenocarcinoma status post induction chemotherapy with carboplatin and Alimta with initial good response followed by disease progression.  The patient is currently on second line treatment with immunotherapy with Nivolumab status post 6 cycles. He continues to tolerate treatment well with few side effects.  Repeat staging CTs of the chest, abdomen and pelvis without contrast on 10/17  and PET scan on 10/10 showed good response to the immunotherapy with no evidence of disease progression.  He is to continue present therapy He had a bronchoscopy and bone biopsy at [facility] on 10/20 which were non diagnostic.  He is scheduled for prostate biopsy at [facility]. He has an appointment with Dr. Delena Bali on 11/18 prior to his biopsy.  He will return in 2 weeks for evaluation and symptom management prior to the start of cycle #8 and every 2 weeks with Dr. Julien Nordmann Due to hyperkalemia in today's labs, this will be rechecked. PAtient has been instructed to not ingest any potassium rich foods temporarily to normalize values.  A  refill of Percocet 5/325, q 4 hrs prn  #60 with no refills was given to the patient to manage his neoplasm related pain. He was advised to call immediately if he has any concerning symptoms in the interval. The patient voices understanding of current disease status and treatment options and is in agreement with the current care plan.   Patient was discussed with Dr. Julien Nordmann who was present for the visit    Rondel Jumbo, PA-C  07/06/2015   ADDENDUM: Hematology/Oncology Attending:  I had a face to face encounter with the patient. I recommended his care plan.  This is a very pleasant 54 years old African-American male with metastatic non-small cell lung cancer, adenocarcinoma is currently undergoing treatment with immunotherapy with Nivolumab status post 6 cycles. The patient has been tolerating his treatment well with no  significant adverse effects.  I recommended for him to proceed with cycle #7 today as a scheduled.  For pain management he was given a refill of Percocet.  He would come back for follow-up visit in 2 weeks for reevaluation before starting the next cycle of his treatment.  The patient was advised to call immediately if he has any concerning symptoms in the interval.   Disclaimer: This note was dictated with voice recognition software. Similar sounding words can inadvertently be transcribed and may be missed upon review. Eilleen Kempf., MD 07/06/2015

## 2015-07-06 NOTE — Progress Notes (Signed)
Reviewed labs with Dr. Julien Nordmann ( Potassium 6, Sodium 135, BUN 55.7, Creatinine 3.2). Okay to treat, redraw Bmet and inform MD of results prior to discharge per Dr. Julien Nordmann.  Potassium 5.2. Reviewed with Dr. Julien Nordmann. Per Dr. Julien Nordmann pt okay for discharge. Pt educated to call with any concerns. Pt verbalizes understanding. Pt stable at time of discharge.

## 2015-07-06 NOTE — Patient Instructions (Signed)
[facility] Discharge Instructions for Patients Receiving Chemotherapy  Today you received the following chemotherapy agents:  Nivolumab.  To help prevent nausea and vomiting after your treatment, we encourage you to take your nausea medication as directed.   If you develop nausea and vomiting that is not controlled by your nausea medication, call the clinic.   BELOW ARE SYMPTOMS THAT SHOULD BE REPORTED IMMEDIATELY:  *FEVER GREATER THAN 100.5 F  *CHILLS WITH OR WITHOUT FEVER  NAUSEA AND VOMITING THAT IS NOT CONTROLLED WITH YOUR NAUSEA MEDICATION  *UNUSUAL SHORTNESS OF BREATH  *UNUSUAL BRUISING OR BLEEDING  TENDERNESS IN MOUTH AND THROAT WITH OR WITHOUT PRESENCE OF ULCERS  *URINARY PROBLEMS  *BOWEL PROBLEMS  UNUSUAL RASH Items with * indicate a potential emergency and should be followed up as soon as possible.  Feel free to call the clinic you have any questions or concerns. The clinic phone number is [phone].  Please show the CHEMO ALERT CARD at check-in to the Emergency Department and triage nurse.   

## 2015-07-19 ENCOUNTER — Other Ambulatory Visit: Payer: Self-pay | Admitting: Medical Oncology

## 2015-07-19 DIAGNOSIS — C801 Malignant (primary) neoplasm, unspecified: Principal | ICD-10-CM

## 2015-07-19 DIAGNOSIS — C7951 Secondary malignant neoplasm of bone: Secondary | ICD-10-CM

## 2015-07-20 ENCOUNTER — Other Ambulatory Visit (HOSPITAL_BASED_OUTPATIENT_CLINIC_OR_DEPARTMENT_OTHER): Payer: Medicare Other

## 2015-07-20 ENCOUNTER — Encounter: Payer: Self-pay | Admitting: *Deleted

## 2015-07-20 ENCOUNTER — Ambulatory Visit (HOSPITAL_BASED_OUTPATIENT_CLINIC_OR_DEPARTMENT_OTHER): Payer: Medicare Other

## 2015-07-20 ENCOUNTER — Telehealth: Payer: Self-pay | Admitting: Internal Medicine

## 2015-07-20 ENCOUNTER — Encounter: Payer: Self-pay | Admitting: Internal Medicine

## 2015-07-20 ENCOUNTER — Ambulatory Visit (HOSPITAL_BASED_OUTPATIENT_CLINIC_OR_DEPARTMENT_OTHER): Payer: Medicare Other | Admitting: Internal Medicine

## 2015-07-20 VITALS — BP 95/59 | HR 109 | Temp 98.1°F | Resp 18 | Ht 69.0 in | Wt 136.1 lb

## 2015-07-20 DIAGNOSIS — C787 Secondary malignant neoplasm of liver and intrahepatic bile duct: Secondary | ICD-10-CM | POA: Diagnosis not present

## 2015-07-20 DIAGNOSIS — C7889 Secondary malignant neoplasm of other digestive organs: Secondary | ICD-10-CM

## 2015-07-20 DIAGNOSIS — C801 Malignant (primary) neoplasm, unspecified: Secondary | ICD-10-CM

## 2015-07-20 DIAGNOSIS — C3491 Malignant neoplasm of unspecified part of right bronchus or lung: Secondary | ICD-10-CM

## 2015-07-20 DIAGNOSIS — Z5112 Encounter for antineoplastic immunotherapy: Secondary | ICD-10-CM

## 2015-07-20 DIAGNOSIS — G893 Neoplasm related pain (acute) (chronic): Secondary | ICD-10-CM

## 2015-07-20 DIAGNOSIS — R52 Pain, unspecified: Secondary | ICD-10-CM

## 2015-07-20 DIAGNOSIS — C3401 Malignant neoplasm of right main bronchus: Secondary | ICD-10-CM | POA: Diagnosis present

## 2015-07-20 DIAGNOSIS — C7951 Secondary malignant neoplasm of bone: Secondary | ICD-10-CM | POA: Diagnosis not present

## 2015-07-20 LAB — CBC WITH DIFFERENTIAL/PLATELET
BASO%: 0.4 % (ref 0.0–2.0)
BASOS ABS: 0 10*3/uL (ref 0.0–0.1)
EOS ABS: 0.2 10*3/uL (ref 0.0–0.5)
EOS%: 2.2 % (ref 0.0–7.0)
HCT: 34 % — ABNORMAL LOW (ref 38.4–49.9)
HGB: 11.1 g/dL — ABNORMAL LOW (ref 13.0–17.1)
LYMPH%: 20.4 % (ref 14.0–49.0)
MCH: 27.5 pg (ref 27.2–33.4)
MCHC: 32.6 g/dL (ref 32.0–36.0)
MCV: 84.2 fL (ref 79.3–98.0)
MONO#: 0.8 10*3/uL (ref 0.1–0.9)
MONO%: 7.8 % (ref 0.0–14.0)
NEUT#: 6.8 10*3/uL — ABNORMAL HIGH (ref 1.5–6.5)
NEUT%: 69.2 % (ref 39.0–75.0)
Platelets: 324 10*3/uL (ref 140–400)
RBC: 4.04 10*6/uL — AB (ref 4.20–5.82)
RDW: 16 % — ABNORMAL HIGH (ref 11.0–14.6)
WBC: 9.9 10*3/uL (ref 4.0–10.3)
lymph#: 2 10*3/uL (ref 0.9–3.3)

## 2015-07-20 LAB — COMPREHENSIVE METABOLIC PANEL (CC13)
ALK PHOS: 151 U/L — AB (ref 40–150)
ALT: 23 U/L (ref 0–55)
AST: 17 U/L (ref 5–34)
Albumin: 3.3 g/dL — ABNORMAL LOW (ref 3.5–5.0)
Anion Gap: 11 mEq/L (ref 3–11)
BUN: 47.2 mg/dL — ABNORMAL HIGH (ref 7.0–26.0)
CHLORIDE: 111 meq/L — AB (ref 98–109)
CO2: 14 meq/L — AB (ref 22–29)
Calcium: 9.5 mg/dL (ref 8.4–10.4)
Creatinine: 2.6 mg/dL — ABNORMAL HIGH (ref 0.7–1.3)
EGFR: 31 mL/min/{1.73_m2} — AB (ref >90)
GLUCOSE: 89 mg/dL (ref 70–140)
POTASSIUM: 5 meq/L (ref 3.5–5.1)
SODIUM: 136 meq/L (ref 136–145)
Total Bilirubin: <0.3 mg/dL (ref 0.20–1.20)
Total Protein: 9 g/dL — ABNORMAL HIGH (ref 6.4–8.3)

## 2015-07-20 MED ORDER — NIVOLUMAB CHEMO INJECTION 100 MG/10ML
240.0000 mg | Freq: Once | INTRAVENOUS | Status: AC
  Administered 2015-07-20: 240 mg via INTRAVENOUS
  Filled 2015-07-20: qty 8

## 2015-07-20 MED ORDER — HEPARIN SOD (PORK) LOCK FLUSH 100 UNIT/ML IV SOLN
500.0000 [IU] | Freq: Once | INTRAVENOUS | Status: AC | PRN
Start: 2015-07-20 — End: 2015-07-20
  Administered 2015-07-20: 500 [IU]
  Filled 2015-07-20: qty 5

## 2015-07-20 MED ORDER — OXYCODONE-ACETAMINOPHEN 5-325 MG PO TABS: 1.0000 | ORAL_TABLET | ORAL | Status: DC | PRN

## 2015-07-20 MED ORDER — SODIUM CHLORIDE 0.9 % IV SOLN
Freq: Once | INTRAVENOUS | Status: AC
  Administered 2015-07-20: 10:00:00 via INTRAVENOUS

## 2015-07-20 MED ORDER — OXYCODONE-ACETAMINOPHEN 5-325 MG PO TABS
ORAL_TABLET | ORAL | Status: AC
  Filled 2015-07-20: qty 1

## 2015-07-20 MED ORDER — OXYCODONE-ACETAMINOPHEN 5-325 MG PO TABS
1.0000 | ORAL_TABLET | Freq: Once | ORAL | Status: AC
  Administered 2015-07-20: 1 via ORAL

## 2015-07-20 MED ORDER — SODIUM CHLORIDE 0.9 % IJ SOLN
10.0000 mL | INTRAMUSCULAR | Status: DC | PRN
  Administered 2015-07-20: 10 mL
  Filled 2015-07-20: qty 10

## 2015-07-20 NOTE — Progress Notes (Signed)
Faxed gas card application to lung cancer initiative

## 2015-07-20 NOTE — Progress Notes (Signed)
[place] [phone]   [phone]  OFFICE PROGRESS NOTE  [name], [name], MD [place] [address]  DIAGNOSIS: Stage IV (T2b, N2, M1b) non-small cell lung cancer, adenocarcinoma with negative EGFR mutation and negative gene translocation presented with a large right hilar mass as well as mediastinal lymphadenopathy and metastatic disease to the liver, bone and pancreas diagnosed in February 2016.  PRIOR THERAPY: Systemic chemotherapy with carboplatin for AUC of 5 and Alimta 500 MG/M2 every 3 weeks. Status post 6 cycles.  CURRENT THERAPY: Immunotherapy with Nivolumab 3 MG/KG every 2 weeks, status post 7 cycles.  INTERVAL HISTORY: Francisco Love 54 y.o. male returns to the clinic today for follow-up visit accompanied by a family member. The patient is tolerating his current treatment with immunotherapy with Nivolumab fairly well. He denied having any significant nausea or vomiting. He has no diarrhea. The patient denied having any fever or chills, no significant weight loss or night sweats. He has no chest pain but continues to have shortness of breath and mild cough with no hemoptysis. He continues to have low back pain and currently on Percocet and requested refill of his medication. He is scheduled to have a prostate biopsy at  [facility] for evaluation of his prostate cancer on 07/27/2015. He is here today to start cycle #8 of his immunotherapy.  MEDICAL HISTORY: Past Medical History  Diagnosis Date  . Shortness of breath dyspnea     ALLERGIES:  has No Known Allergies.  MEDICATIONS:  Current Outpatient Prescriptions  Medication Sig Dispense Refill  . acetaminophen (TYLENOL) 325 MG tablet Take 2 tablets (650 mg total) by mouth every 6 (six) hours as needed for mild pain (or Fever >/= 101). (Patient not taking: Reported on 05/11/2015)    . albuterol (PROVENTIL) (2.5 MG/3ML) 0.083% nebulizer solution Take 3 mLs (2.5  mg total) by nebulization every 2 (two) hours as needed for wheezing. 75 mL 12  . AMITIZA 24 MCG capsule     . diphenhydrAMINE (SOMINEX) 25 MG tablet Take 25 mg by mouth as needed for itching or sleep.    . feeding supplement, ENSURE COMPLETE, (ENSURE COMPLETE) LIQD Take 237 mLs by mouth 3 (three) times daily between meals.    . lidocaine-prilocaine (EMLA) cream Apply 1 application topically as needed. Apply small amount  to port site 1.5 hours prior to chemotherapy. Do not rub in cream . Cover with cling wrap 30 g 0  . mirtazapine (REMERON) 15 MG tablet Take 1 tablet (15 mg total) by mouth at bedtime.    Marland Kitchen oxyCODONE-acetaminophen (PERCOCET/ROXICET) 5-325 MG tablet Take 1 tablet by mouth every 4 (four) hours as needed for severe pain. 60 tablet 0  . polyethylene glycol (MIRALAX / GLYCOLAX) packet Take 17 g by mouth daily.    . prochlorperazine (COMPAZINE) 10 MG tablet Take 1 tablet (10 mg total) by mouth every 6 (six) hours as needed for nausea or vomiting. (Patient not taking: Reported on 05/11/2015) 30 tablet 1  . senna-docusate (SENOKOT-S) 8.6-50 MG per tablet Take 1 tablet by mouth daily.    . tamsulosin (FLOMAX) 0.4 MG CAPS capsule      No current facility-administered medications for this visit.    SURGICAL HISTORY:  Past Surgical History  Procedure Laterality Date  . Appendectomy    . Video bronchoscopy N/A 09/21/2014    Procedure: VIDEO BRONCHOSCOPY WITH FLUORO;  Surgeon: Rigoberto Noel, MD;  Location: [place];  Service: Cardiopulmonary;  Laterality: N/A;  . Video bronchoscopy with endobronchial ultrasound N/A 09/23/2014    Procedure: VIDEO BRONCHOSCOPY WITH ENDOBRONCHIAL ULTRASOUND;  Surgeon: Rigoberto Noel, MD;  Location: [place];  Service: Pulmonary;  Laterality: N/A;    REVIEW OF SYSTEMS:  A comprehensive review of systems was negative except for: Constitutional: positive for fatigue Respiratory: positive for cough and dyspnea on exertion Musculoskeletal: positive for bone pain and  muscle weakness   PHYSICAL EXAMINATION: General appearance: alert, cooperative, fatigued and no distress Head: Normocephalic, without obvious abnormality, atraumatic Neck: no adenopathy, no JVD, supple, symmetrical, trachea midline and thyroid not enlarged, symmetric, no tenderness/mass/nodules Lymph nodes: Cervical, supraclavicular, and axillary nodes normal. Resp: clear to auscultation bilaterally Back: symmetric, no curvature. ROM normal. No CVA tenderness. Cardio: regular rate and rhythm, S1, S2 normal, no murmur, click, rub or gallop GI: soft, non-tender; bowel sounds normal; no masses,  no organomegaly Extremities: extremities normal, atraumatic, no cyanosis or edema  ECOG PERFORMANCE STATUS: 2 - Symptomatic, <50% confined to bed  There were no vitals taken for this visit.  LABORATORY DATA: Lab Results  Component Value Date   WBC 9.9 07/20/2015   HGB 11.1* 07/20/2015   HCT 34.0* 07/20/2015   MCV 84.2 07/20/2015   PLT 324 07/20/2015      Chemistry      Component Value Date/Time   NA 136 07/20/2015 0808   NA 135 10/25/2014 0845   K 5.0 07/20/2015 0808   K 5.3* 10/25/2014 0845   CL 108 10/25/2014 0845   CO2 14* 07/20/2015 0808   CO2 19 10/25/2014 0845   BUN 47.2* 07/20/2015 0808   BUN 22 10/25/2014 0845   CREATININE 2.6* 07/20/2015 0808   CREATININE 1.03 10/25/2014 0845      Component Value Date/Time   CALCIUM 9.5 07/20/2015 0808   CALCIUM 8.6 10/25/2014 0845   ALKPHOS 151* 07/20/2015 0808   ALKPHOS 882* 09/20/2014 1120   AST 17 07/20/2015 0808   AST 29 09/20/2014 1120   ALT 23 07/20/2015 0808   ALT 34 09/20/2014 1120   BILITOT <0.30 07/20/2015 0808   BILITOT 0.1* 09/20/2014 1120       RADIOGRAPHIC STUDIES: No results found.  ASSESSMENT AND PLAN: This is a very pleasant 54 years old African-American male with metastatic non-small cell lung cancer, adenocarcinoma status post induction chemotherapy with carboplatin and Alimta with initial good response  followed by disease progression. The patient is currently on second line treatment with immunotherapy with Nivolumab status post 7 cycles. He tolerated the last cycle of his treatment fairly well with no concerning adverse effects. I recommended for the patient to proceed with cycle #8 of his immunotherapy today as scheduled. For pain management he would continue on Percocet as needed. The patient would come back for follow-up visit in 2 weeks for reevaluation before starting cycle #9 after repeating CT scan of the chest, abdomen and pelvis for restaging of his disease. He was advised to call immediately if he has any concerning symptoms in the interval. The patient voices understanding of current disease status and treatment options and is in agreement with the current care plan.  All questions were answered. The patient knows to call the clinic with any problems, questions or concerns. We can certainly see the patient much sooner if necessary.  Disclaimer: This note was dictated with voice recognition software. Similar sounding words can inadvertently be transcribed and may not be corrected upon review.

## 2015-07-20 NOTE — Patient Instructions (Signed)
[facility] Discharge Instructions for Patients Receiving Chemotherapy  Today you received the following chemotherapy agents:  Nivolumab.  To help prevent nausea and vomiting after your treatment, we encourage you to take your nausea medication as directed.   If you develop nausea and vomiting that is not controlled by your nausea medication, call the clinic.   BELOW ARE SYMPTOMS THAT SHOULD BE REPORTED IMMEDIATELY:  *FEVER GREATER THAN 100.5 F  *CHILLS WITH OR WITHOUT FEVER  NAUSEA AND VOMITING THAT IS NOT CONTROLLED WITH YOUR NAUSEA MEDICATION  *UNUSUAL SHORTNESS OF BREATH  *UNUSUAL BRUISING OR BLEEDING  TENDERNESS IN MOUTH AND THROAT WITH OR WITHOUT PRESENCE OF ULCERS  *URINARY PROBLEMS  *BOWEL PROBLEMS  UNUSUAL RASH Items with * indicate a potential emergency and should be followed up as soon as possible.  Feel free to call the clinic you have any questions or concerns. The clinic phone number is [phone].  Please show the CHEMO ALERT CARD at check-in to the Emergency Department and triage nurse.   

## 2015-07-20 NOTE — Progress Notes (Signed)
Oncology Nurse Navigator Documentation  Oncology Nurse Navigator Flowsheets 07/20/2015  Navigator Encounter Type Clinic/MDC/spoke with patient and friend today during his tx.  I provided him with resources for financial support.  I gave him numbers to call for [place] [employer] [place].  I also helped him complete application for gas card from [employer].  He was thankful for the help.   Patient Visit Type Follow-up  Treatment Phase Treatment  Barriers/Navigation Needs Financial  Interventions Other  Time Spent with Patient 30

## 2015-07-20 NOTE — Telephone Encounter (Signed)
per pof to sch pt appt-gave pt copy of avs-gave contrast-adv pt central sch will call to sch scan

## 2015-07-20 NOTE — Progress Notes (Signed)
Reviewed with Dr. Providence Lanius to treat with Creatine 2.6, VO for percocet x 1. Infusion RN will put in order.

## 2015-07-20 NOTE — Addendum Note (Signed)
Addended by: Lucile Crater on: 07/20/2015 10:05 AM   Modules accepted: Medications

## 2015-08-02 ENCOUNTER — Other Ambulatory Visit: Payer: Self-pay | Admitting: Medical Oncology

## 2015-08-02 DIAGNOSIS — C7951 Secondary malignant neoplasm of bone: Secondary | ICD-10-CM

## 2015-08-02 DIAGNOSIS — C801 Malignant (primary) neoplasm, unspecified: Principal | ICD-10-CM

## 2015-08-03 ENCOUNTER — Ambulatory Visit (HOSPITAL_COMMUNITY)
Admission: RE | Admit: 2015-08-03 | Discharge: 2015-08-03 | Disposition: A | Discharge status: Home / Self Care | Payer: Medicare Other | Source: Ambulatory Visit | Attending: Internal Medicine | Admitting: Internal Medicine

## 2015-08-03 ENCOUNTER — Encounter: Payer: Self-pay | Admitting: Internal Medicine

## 2015-08-03 ENCOUNTER — Other Ambulatory Visit: Payer: Self-pay | Admitting: *Deleted

## 2015-08-03 ENCOUNTER — Encounter (HOSPITAL_COMMUNITY): Payer: Self-pay

## 2015-08-03 ENCOUNTER — Encounter: Payer: Self-pay | Admitting: *Deleted

## 2015-08-03 ENCOUNTER — Other Ambulatory Visit: Payer: Self-pay | Admitting: Internal Medicine

## 2015-08-03 ENCOUNTER — Telehealth: Payer: Self-pay | Admitting: *Deleted

## 2015-08-03 ENCOUNTER — Ambulatory Visit (HOSPITAL_BASED_OUTPATIENT_CLINIC_OR_DEPARTMENT_OTHER): Payer: Medicare Other

## 2015-08-03 ENCOUNTER — Ambulatory Visit (HOSPITAL_BASED_OUTPATIENT_CLINIC_OR_DEPARTMENT_OTHER): Payer: Medicare Other | Admitting: Internal Medicine

## 2015-08-03 ENCOUNTER — Other Ambulatory Visit (HOSPITAL_BASED_OUTPATIENT_CLINIC_OR_DEPARTMENT_OTHER): Payer: Medicare Other

## 2015-08-03 ENCOUNTER — Telehealth: Payer: Self-pay | Admitting: Internal Medicine

## 2015-08-03 VITALS — BP 98/68 | HR 96 | Temp 97.3°F | Resp 17 | Ht 69.0 in | Wt 137.5 lb

## 2015-08-03 DIAGNOSIS — C801 Malignant (primary) neoplasm, unspecified: Principal | ICD-10-CM

## 2015-08-03 DIAGNOSIS — C7951 Secondary malignant neoplasm of bone: Secondary | ICD-10-CM

## 2015-08-03 DIAGNOSIS — Z5112 Encounter for antineoplastic immunotherapy: Secondary | ICD-10-CM | POA: Diagnosis present

## 2015-08-03 DIAGNOSIS — C3401 Malignant neoplasm of right main bronchus: Secondary | ICD-10-CM

## 2015-08-03 DIAGNOSIS — C3491 Malignant neoplasm of unspecified part of right bronchus or lung: Secondary | ICD-10-CM

## 2015-08-03 DIAGNOSIS — J90 Pleural effusion, not elsewhere classified: Secondary | ICD-10-CM | POA: Diagnosis not present

## 2015-08-03 DIAGNOSIS — G893 Neoplasm related pain (acute) (chronic): Secondary | ICD-10-CM | POA: Diagnosis present

## 2015-08-03 DIAGNOSIS — R918 Other nonspecific abnormal finding of lung field: Secondary | ICD-10-CM | POA: Diagnosis not present

## 2015-08-03 DIAGNOSIS — R590 Localized enlarged lymph nodes: Secondary | ICD-10-CM | POA: Diagnosis not present

## 2015-08-03 LAB — CBC WITH DIFFERENTIAL/PLATELET
BASO%: 0.8 % (ref 0.0–2.0)
BASOS ABS: 0.1 10*3/uL (ref 0.0–0.1)
EOS ABS: 0.3 10*3/uL (ref 0.0–0.5)
EOS%: 3 % (ref 0.0–7.0)
HEMATOCRIT: 33.7 % — AB (ref 38.4–49.9)
HEMOGLOBIN: 10.7 g/dL — AB (ref 13.0–17.1)
LYMPH#: 2 10*3/uL (ref 0.9–3.3)
LYMPH%: 18.9 % (ref 14.0–49.0)
MCH: 26.8 pg — AB (ref 27.2–33.4)
MCHC: 31.8 g/dL — AB (ref 32.0–36.0)
MCV: 84.1 fL (ref 79.3–98.0)
MONO#: 1 10*3/uL — ABNORMAL HIGH (ref 0.1–0.9)
MONO%: 9.2 % (ref 0.0–14.0)
NEUT#: 7.3 10*3/uL — ABNORMAL HIGH (ref 1.5–6.5)
NEUT%: 68.1 % (ref 39.0–75.0)
Platelets: 305 10*3/uL (ref 140–400)
RBC: 4 10*6/uL — ABNORMAL LOW (ref 4.20–5.82)
RDW: 17 % — AB (ref 11.0–14.6)
WBC: 10.7 10*3/uL — ABNORMAL HIGH (ref 4.0–10.3)

## 2015-08-03 LAB — COMPREHENSIVE METABOLIC PANEL
ALT: 41 U/L (ref 0–55)
AST: 36 U/L — AB (ref 5–34)
Albumin: 3.1 g/dL — ABNORMAL LOW (ref 3.5–5.0)
Alkaline Phosphatase: 184 U/L — ABNORMAL HIGH (ref 40–150)
Anion Gap: 11 mEq/L (ref 3–11)
BUN: 41 mg/dL — AB (ref 7.0–26.0)
CHLORIDE: 109 meq/L (ref 98–109)
CO2: 16 meq/L — AB (ref 22–29)
Calcium: 9 mg/dL (ref 8.4–10.4)
Creatinine: 2.7 mg/dL — ABNORMAL HIGH (ref 0.7–1.3)
EGFR: 29 mL/min/{1.73_m2} — ABNORMAL LOW (ref >90)
GLUCOSE: 85 mg/dL (ref 70–140)
SODIUM: 136 meq/L (ref 136–145)
Total Bilirubin: <0.3 mg/dL (ref 0.20–1.20)
Total Protein: 8.5 g/dL — ABNORMAL HIGH (ref 6.4–8.3)

## 2015-08-03 MED ORDER — SODIUM CHLORIDE 0.9 % IV SOLN
Freq: Once | INTRAVENOUS | Status: AC
  Administered 2015-08-03: 10:00:00 via INTRAVENOUS

## 2015-08-03 MED ORDER — SODIUM CHLORIDE 0.9 % IV SOLN
240.0000 mg | Freq: Once | INTRAVENOUS | Status: AC
  Administered 2015-08-03: 240 mg via INTRAVENOUS
  Filled 2015-08-03: qty 20

## 2015-08-03 MED ORDER — OXYCODONE-ACETAMINOPHEN 5-325 MG PO TABS: 1.0000 | ORAL_TABLET | ORAL | Status: DC | PRN

## 2015-08-03 MED ORDER — SODIUM CHLORIDE 0.9 % IJ SOLN
10.0000 mL | INTRAMUSCULAR | Status: DC | PRN
  Administered 2015-08-03: 10 mL
  Filled 2015-08-03: qty 10

## 2015-08-03 MED ORDER — HEPARIN SOD (PORK) LOCK FLUSH 100 UNIT/ML IV SOLN
500.0000 [IU] | Freq: Once | INTRAVENOUS | Status: AC | PRN
  Administered 2015-08-03: 500 [IU]
  Filled 2015-08-03: qty 5

## 2015-08-03 MED ORDER — OXYCODONE-ACETAMINOPHEN 5-325 MG PO TABS
1.0000 | ORAL_TABLET | Freq: Once | ORAL | Status: AC
  Administered 2015-08-03: 1 via ORAL

## 2015-08-03 MED ORDER — OXYCODONE-ACETAMINOPHEN 5-325 MG PO TABS
ORAL_TABLET | ORAL | Status: AC
  Filled 2015-08-03: qty 1

## 2015-08-03 NOTE — Progress Notes (Signed)
Oncology Nurse Navigator Documentation  Oncology Nurse Navigator Flowsheets 08/03/2015  Navigator Encounter Type Clinic/MDC/spoke with patient and family today at [facility].  I helped patient complete [employer] card application.  I faxed application with confirmation back.  The family was thankful for the help  Patient Visit Type Follow-up  Treatment Phase Treatment  Barriers/Navigation Needs Financial  Interventions Other  Time Spent with Patient 30

## 2015-08-03 NOTE — Telephone Encounter (Signed)
per pof to sch pt appt*sent MAW email to sch pt trmt-pt to get updated copy in trmt room

## 2015-08-03 NOTE — Progress Notes (Signed)
[place] [phone]   [phone]  OFFICE PROGRESS NOTE  [name], [name], MD [place] [address]  DIAGNOSIS: Stage IV (T2b, N2, M1b) non-small cell lung cancer, adenocarcinoma with negative EGFR mutation and negative gene translocation presented with a large right hilar mass as well as mediastinal lymphadenopathy and metastatic disease to the liver, bone and pancreas diagnosed in February 2016.  PRIOR THERAPY: Systemic chemotherapy with carboplatin for AUC of 5 and Alimta 500 MG/M2 every 3 weeks. Status post 6 cycles.  CURRENT THERAPY: Immunotherapy with Nivolumab 3 MG/KG every 2 weeks, status post 8 cycles.  INTERVAL HISTORY: Francisco Love 54 y.o. male returns to the clinic today for follow-up visit accompanied by 2 family members. The patient is tolerating his current treatment with immunotherapy with Nivolumab fairly well. He denied having any significant nausea or vomiting. He has no diarrhea. The patient denied having any fever or chills, no significant weight loss or night sweats. He has no chest pain, shortness of breath but has mild cough with no hemoptysis. He also has some sores in his mouth. He continues to have low back pain and currently on Percocet and requested refill of his medication. He had a prostate biopsy performed at [facility] last week. The patient had repeat CT scan of the chest, abdomen and pelvis performed earlier today and he is here for evaluation and discussion of his scan results.  MEDICAL HISTORY: Past Medical History  Diagnosis Date  . Shortness of breath dyspnea     ALLERGIES:  has No Known Allergies.  MEDICATIONS:  Current Outpatient Prescriptions  Medication Sig Dispense Refill  . acetaminophen (TYLENOL) 325 MG tablet Take 2 tablets (650 mg total) by mouth every 6 (six) hours as needed for mild pain (or Fever >/= 101).    Marland Kitchen albuterol (PROVENTIL) (2.5 MG/3ML) 0.083% nebulizer  solution Take 3 mLs (2.5 mg total) by nebulization every 2 (two) hours as needed for wheezing. 75 mL 12  . AMITIZA 24 MCG capsule     . clobetasol ointment (TEMOVATE) 0.05 % Apply topically.    . diphenhydrAMINE (BENADRYL) 25 MG tablet Take 25 mg by mouth.    . diphenhydrAMINE (SOMINEX) 25 MG tablet Take 25 mg by mouth as needed for itching or sleep.    . feeding supplement, ENSURE COMPLETE, (ENSURE COMPLETE) LIQD Take 237 mLs by mouth 3 (three) times daily between meals.    Marland Kitchen levofloxacin (LEVAQUIN) 500 MG tablet     . lidocaine-prilocaine (EMLA) cream Apply 1 application topically as needed. Apply small amount  to port site 1.5 hours prior to chemotherapy. Do not rub in cream . Cover with cling wrap 30 g 0  . mirtazapine (REMERON) 15 MG tablet Take 1 tablet (15 mg total) by mouth at bedtime.    Marland Kitchen oxyCODONE-acetaminophen (PERCOCET/ROXICET) 5-325 MG tablet Take 1 tablet by mouth every 4 (four) hours as needed for severe pain. 60 tablet 0  . polyethylene glycol (MIRALAX / GLYCOLAX) packet Take 17 g by mouth daily.    . prochlorperazine (COMPAZINE) 10 MG tablet Take 1 tablet (10 mg total) by mouth every 6 (six) hours as needed for nausea or vomiting. 30 tablet 1  . senna-docusate (SENOKOT-S) 8.6-50 MG per tablet Take 1 tablet by mouth daily.    . tamsulosin (FLOMAX) 0.4 MG CAPS capsule      No current facility-administered medications for this visit.    SURGICAL HISTORY:  Past  Surgical History  Procedure Laterality Date  . Appendectomy    . Video bronchoscopy N/A 09/21/2014    Procedure: VIDEO BRONCHOSCOPY WITH FLUORO;  Surgeon: Rigoberto Noel, MD;  Location: [place];  Service: Cardiopulmonary;  Laterality: N/A;  . Video bronchoscopy with endobronchial ultrasound N/A 09/23/2014    Procedure: VIDEO BRONCHOSCOPY WITH ENDOBRONCHIAL ULTRASOUND;  Surgeon: Rigoberto Noel, MD;  Location: [place];  Service: Pulmonary;  Laterality: N/A;    REVIEW OF SYSTEMS:  Constitutional: positive for fatigue Eyes:  negative Ears, nose, mouth, throat, and face: positive for sore mouth Respiratory: negative Cardiovascular: negative Gastrointestinal: negative Genitourinary:negative Integument/breast: negative Hematologic/lymphatic: negative Musculoskeletal:negative Neurological: negative Behavioral/Psych: negative Endocrine: negative Allergic/Immunologic: negative   PHYSICAL EXAMINATION: General appearance: alert, cooperative, fatigued and no distress Head: Normocephalic, without obvious abnormality, atraumatic Neck: no adenopathy, no JVD, supple, symmetrical, trachea midline and thyroid not enlarged, symmetric, no tenderness/mass/nodules Lymph nodes: Cervical, supraclavicular, and axillary nodes normal. Resp: clear to auscultation bilaterally Back: symmetric, no curvature. ROM normal. No CVA tenderness. Cardio: regular rate and rhythm, S1, S2 normal, no murmur, click, rub or gallop GI: soft, non-tender; bowel sounds normal; no masses,  no organomegaly Extremities: extremities normal, atraumatic, no cyanosis or edema Neurologic: Alert and oriented X 3, normal strength and tone. Normal symmetric reflexes. Normal coordination and gait  ECOG PERFORMANCE STATUS: 2 - Symptomatic, <50% confined to bed  Blood pressure 98/68, pulse 96, temperature 97.3 F (36.3 C), temperature source Oral, resp. rate 17, height 5' 9"  (1.753 m), weight 137 lb 8 oz (62.37 kg), SpO2 96 %.  LABORATORY DATA: Lab Results  Component Value Date   WBC 10.7* 08/03/2015   HGB 10.7* 08/03/2015   HCT 33.7* 08/03/2015   MCV 84.1 08/03/2015   PLT 305 08/03/2015      Chemistry      Component Value Date/Time   NA 136 07/20/2015 0808   NA 135 10/25/2014 0845   K 5.0 07/20/2015 0808   K 5.3* 10/25/2014 0845   CL 108 10/25/2014 0845   CO2 14* 07/20/2015 0808   CO2 19 10/25/2014 0845   BUN 47.2* 07/20/2015 0808   BUN 22 10/25/2014 0845   CREATININE 2.6* 07/20/2015 0808   CREATININE 1.03 10/25/2014 0845      Component  Value Date/Time   CALCIUM 9.5 07/20/2015 0808   CALCIUM 8.6 10/25/2014 0845   ALKPHOS 151* 07/20/2015 0808   ALKPHOS 882* 09/20/2014 1120   AST 17 07/20/2015 0808   AST 29 09/20/2014 1120   ALT 23 07/20/2015 0808   ALT 34 09/20/2014 1120   BILITOT <0.30 07/20/2015 0808   BILITOT 0.1* 09/20/2014 1120       RADIOGRAPHIC STUDIES: Ct Abdomen Pelvis Wo Contrast  08/03/2015  CLINICAL DATA:  54 year old male with history of metastatic non-small cell lung cancer diagnosed in February 2016 undergoing ongoing chemotherapy. Prior smoker. EXAM: CT CHEST, ABDOMEN AND PELVIS WITHOUT CONTRAST TECHNIQUE: Multidetector CT imaging of the chest, abdomen and pelvis was performed following the standard protocol without IV contrast. COMPARISON:  CT of the chest, abdomen and pelvis tooth 06/06/2015. FINDINGS: CT CHEST FINDINGS Mediastinum/Lymph Nodes: Right paratracheal nodal mass measures up to 4.6 x 3.1 cm (image 18 of series 2), which is unchanged within measurement error compared to the prior examination. Subcarinal lymph nodes measure up to 12 mm in short axis (slightly improved). Right hilar lymph nodes (densely calcified) measure up to 12 mm in short axis (slightly improved). No other new lymphadenopathy noted elsewhere in the mediastinal or left  hilar regions on today's noncontrast CT examination. Please note that accurate exclusion of hilar adenopathy is limited on noncontrast CT scans. Heart size is normal. There is no significant pericardial fluid, thickening or pericardial calcification. There is atherosclerosis of the thoracic aorta, the great vessels of the mediastinum and the coronary arteries, including calcified atherosclerotic plaque in the left anterior descending coronary artery. Esophagus is unremarkable in appearance. No axillary lymphadenopathy. Right internal jugular single-lumen porta cath with tip terminating in the superior aspect of the right atrium. Lungs/Pleura: 5 mm right lower lobe nodule  (image 33 of series 4) is unchanged. Partially calcified 7 mm nodule in the left upper lobe (image 25 of series 4) is also unchanged, likely to represent a partially calcified granuloma. 3 mm subpleural nodule in the apex of the left upper lobe (image 8 of series 4) is unchanged. No other new suspicious appearing pulmonary nodules or masses are noted. No acute consolidative airspace disease. Small bilateral pleural effusions layering dependently (left greater than right) are unchanged. Mild centrilobular and paraseptal emphysema. Musculoskeletal/Soft Tissues: Again noted are innumerable predominantly sclerotic lesions throughout the visualized axial and appendicular skeleton, which appear grossly similar to the prior examination, compatible with widespread metastatic disease to the bones. This is most confluent at T1 and T11 where the entire vertebral bodies are involved at both levels. CT ABDOMEN AND PELVIS FINDINGS Hepatobiliary: No definite cystic or solid hepatic lesions are identified on today's noncontrast CT examination. Unenhanced appearance of the gallbladder is normal. Pancreas: Calcifications are again noted in the region of the head of the pancreas, poorly evaluated on today's noncontrast CT examination, but similar to several prior studies, favored to be related to prior episodes of pancreatitis. No peripancreatic inflammatory changes are identified on today's noncontrast CT examination. Spleen: Unremarkable. Adrenals/Urinary Tract: Bilateral adrenal glands and the right kidney are normal in appearance on today's noncontrast CT examination. Mild atrophy of the left kidney is unchanged. No hydroureteronephrosis. Unenhanced appearance of the urinary bladder is normal. Stomach/Bowel: Unenhanced appearance of the stomach is normal. No pathologic dilatation of small bowel or colon. Vascular/Lymphatic: Atherosclerotic calcifications throughout the abdominal and pelvic vasculature, without evidence of  aneurysm. No definite lymphadenopathy noted in the abdomen or pelvis on today's noncontrast CT examination. Reproductive: Prostate gland and seminal vesicles are unremarkable in appearance. Other: No significant volume of ascites.  No pneumoperitoneum. Musculoskeletal: Widespread sclerotic lesions are again noted throughout the visualized axial and appendicular skeleton, very similar to the prior study, compatible with widespread metastatic disease to the bones. Severe joint space narrowing, subchondral sclerosis, subchondral cyst formation and osteophyte formation is again noted in both hip joints, compatible with advanced osteoarthritis. In addition, there is collapse of the weight-bearing surfaces of the femoral heads bilaterally, suggestive of avascular necrosis (unchanged). IMPRESSION: 1. Today's examination demonstrates stable to slightly improved mediastinal and right hilar lymphadenopathy, as discussed above. Additionally, all previously noted tiny pulmonary nodules are unchanged. Small bilateral pleural effusions (left greater than right) are also unchanged. 2. Widespread metastatic disease to the bones appear similar to the prior examination. 3. No definite new metastatic disease noted in the chest, abdomen or pelvis on today's examination. 4. Additional incidental findings, similar to the prior study, as above. Electronically Signed   By: Vinnie Langton M.D.   On: 08/03/2015 07:54   Ct Chest Wo Contrast  08/03/2015  CLINICAL DATA:  54 year old male with history of metastatic non-small cell lung cancer diagnosed in February 2016 undergoing ongoing chemotherapy. Prior smoker. EXAM: CT CHEST,  ABDOMEN AND PELVIS WITHOUT CONTRAST TECHNIQUE: Multidetector CT imaging of the chest, abdomen and pelvis was performed following the standard protocol without IV contrast. COMPARISON:  CT of the chest, abdomen and pelvis tooth 06/06/2015. FINDINGS: CT CHEST FINDINGS Mediastinum/Lymph Nodes: Right paratracheal  nodal mass measures up to 4.6 x 3.1 cm (image 18 of series 2), which is unchanged within measurement error compared to the prior examination. Subcarinal lymph nodes measure up to 12 mm in short axis (slightly improved). Right hilar lymph nodes (densely calcified) measure up to 12 mm in short axis (slightly improved). No other new lymphadenopathy noted elsewhere in the mediastinal or left hilar regions on today's noncontrast CT examination. Please note that accurate exclusion of hilar adenopathy is limited on noncontrast CT scans. Heart size is normal. There is no significant pericardial fluid, thickening or pericardial calcification. There is atherosclerosis of the thoracic aorta, the great vessels of the mediastinum and the coronary arteries, including calcified atherosclerotic plaque in the left anterior descending coronary artery. Esophagus is unremarkable in appearance. No axillary lymphadenopathy. Right internal jugular single-lumen porta cath with tip terminating in the superior aspect of the right atrium. Lungs/Pleura: 5 mm right lower lobe nodule (image 33 of series 4) is unchanged. Partially calcified 7 mm nodule in the left upper lobe (image 25 of series 4) is also unchanged, likely to represent a partially calcified granuloma. 3 mm subpleural nodule in the apex of the left upper lobe (image 8 of series 4) is unchanged. No other new suspicious appearing pulmonary nodules or masses are noted. No acute consolidative airspace disease. Small bilateral pleural effusions layering dependently (left greater than right) are unchanged. Mild centrilobular and paraseptal emphysema. Musculoskeletal/Soft Tissues: Again noted are innumerable predominantly sclerotic lesions throughout the visualized axial and appendicular skeleton, which appear grossly similar to the prior examination, compatible with widespread metastatic disease to the bones. This is most confluent at T1 and T11 where the entire vertebral bodies are  involved at both levels. CT ABDOMEN AND PELVIS FINDINGS Hepatobiliary: No definite cystic or solid hepatic lesions are identified on today's noncontrast CT examination. Unenhanced appearance of the gallbladder is normal. Pancreas: Calcifications are again noted in the region of the head of the pancreas, poorly evaluated on today's noncontrast CT examination, but similar to several prior studies, favored to be related to prior episodes of pancreatitis. No peripancreatic inflammatory changes are identified on today's noncontrast CT examination. Spleen: Unremarkable. Adrenals/Urinary Tract: Bilateral adrenal glands and the right kidney are normal in appearance on today's noncontrast CT examination. Mild atrophy of the left kidney is unchanged. No hydroureteronephrosis. Unenhanced appearance of the urinary bladder is normal. Stomach/Bowel: Unenhanced appearance of the stomach is normal. No pathologic dilatation of small bowel or colon. Vascular/Lymphatic: Atherosclerotic calcifications throughout the abdominal and pelvic vasculature, without evidence of aneurysm. No definite lymphadenopathy noted in the abdomen or pelvis on today's noncontrast CT examination. Reproductive: Prostate gland and seminal vesicles are unremarkable in appearance. Other: No significant volume of ascites.  No pneumoperitoneum. Musculoskeletal: Widespread sclerotic lesions are again noted throughout the visualized axial and appendicular skeleton, very similar to the prior study, compatible with widespread metastatic disease to the bones. Severe joint space narrowing, subchondral sclerosis, subchondral cyst formation and osteophyte formation is again noted in both hip joints, compatible with advanced osteoarthritis. In addition, there is collapse of the weight-bearing surfaces of the femoral heads bilaterally, suggestive of avascular necrosis (unchanged). IMPRESSION: 1. Today's examination demonstrates stable to slightly improved mediastinal and  right hilar lymphadenopathy, as discussed  above. Additionally, all previously noted tiny pulmonary nodules are unchanged. Small bilateral pleural effusions (left greater than right) are also unchanged. 2. Widespread metastatic disease to the bones appear similar to the prior examination. 3. No definite new metastatic disease noted in the chest, abdomen or pelvis on today's examination. 4. Additional incidental findings, similar to the prior study, as above. Electronically Signed   By: Vinnie Langton M.D.   On: 08/03/2015 07:54    ASSESSMENT AND PLAN: This is a very pleasant 54 years old African-American male with metastatic non-small cell lung cancer, adenocarcinoma status post induction chemotherapy with carboplatin and Alimta with initial good response followed by disease progression. The patient is currently on second line treatment with immunotherapy with Nivolumab status post 8 cycles. He tolerated the last cycle of his treatment fairly well with no concerning adverse effects. The recent CT scan of the chest, abdomen and pelvis showed stable to slightly improved mediastinal and right hilar lymphadenopathy as well as stable widespread metastatic bone disease. I recommended for the patient to proceed with cycle #9 of his immunotherapy today as scheduled. For pain management he would continue on Percocet as needed. He was given a refill of this medication today. For the metastatic bone disease, I discussed with the patient consideration of treatment with Xgeva after receive dental clearance. He is better reluctant about new treatment. The patient would come back for follow-up visit in 2 weeks for reevaluation before starting cycle #10. He was advised to call immediately if he has any concerning symptoms in the interval. The patient voices understanding of current disease status and treatment options and is in agreement with the current care plan.  All questions were answered. The patient knows to  call the clinic with any problems, questions or concerns. We can certainly see the patient much sooner if necessary.  Disclaimer: This note was dictated with voice recognition software. Similar sounding words can inadvertently be transcribed and may not be corrected upon review.

## 2015-08-03 NOTE — Telephone Encounter (Signed)
Per staff message and POF I have scheduled appts. Advised scheduler of appts. JMW  

## 2015-08-17 ENCOUNTER — Other Ambulatory Visit: Payer: Self-pay | Admitting: *Deleted

## 2015-08-17 ENCOUNTER — Ambulatory Visit (HOSPITAL_BASED_OUTPATIENT_CLINIC_OR_DEPARTMENT_OTHER): Payer: Medicare Other

## 2015-08-17 ENCOUNTER — Encounter: Payer: Self-pay | Admitting: Internal Medicine

## 2015-08-17 ENCOUNTER — Ambulatory Visit (HOSPITAL_BASED_OUTPATIENT_CLINIC_OR_DEPARTMENT_OTHER): Payer: Medicare Other | Admitting: Internal Medicine

## 2015-08-17 VITALS — BP 119/78 | HR 100 | Temp 97.6°F | Resp 18 | Ht 69.0 in | Wt 136.2 lb

## 2015-08-17 DIAGNOSIS — C3401 Malignant neoplasm of right main bronchus: Secondary | ICD-10-CM

## 2015-08-17 DIAGNOSIS — C61 Malignant neoplasm of prostate: Secondary | ICD-10-CM | POA: Diagnosis not present

## 2015-08-17 DIAGNOSIS — G893 Neoplasm related pain (acute) (chronic): Secondary | ICD-10-CM

## 2015-08-17 DIAGNOSIS — C787 Secondary malignant neoplasm of liver and intrahepatic bile duct: Secondary | ICD-10-CM | POA: Diagnosis not present

## 2015-08-17 DIAGNOSIS — C3491 Malignant neoplasm of unspecified part of right bronchus or lung: Secondary | ICD-10-CM

## 2015-08-17 DIAGNOSIS — C7951 Secondary malignant neoplasm of bone: Secondary | ICD-10-CM

## 2015-08-17 DIAGNOSIS — C7889 Secondary malignant neoplasm of other digestive organs: Secondary | ICD-10-CM

## 2015-08-17 DIAGNOSIS — C801 Malignant (primary) neoplasm, unspecified: Principal | ICD-10-CM

## 2015-08-17 DIAGNOSIS — Z5112 Encounter for antineoplastic immunotherapy: Secondary | ICD-10-CM

## 2015-08-17 LAB — COMPREHENSIVE METABOLIC PANEL
ALBUMIN: 3 g/dL — AB (ref 3.5–5.0)
ALK PHOS: 216 U/L — AB (ref 40–150)
ALT: 25 U/L (ref 0–55)
ANION GAP: 9 meq/L (ref 3–11)
AST: 17 U/L (ref 5–34)
BUN: 49 mg/dL — ABNORMAL HIGH (ref 7.0–26.0)
CALCIUM: 9 mg/dL (ref 8.4–10.4)
CO2: 17 mEq/L — ABNORMAL LOW (ref 22–29)
CREATININE: 2.6 mg/dL — AB (ref 0.7–1.3)
Chloride: 111 mEq/L — ABNORMAL HIGH (ref 98–109)
EGFR: 31 mL/min/{1.73_m2} — ABNORMAL LOW (ref >90)
Glucose: 88 mg/dl (ref 70–140)
Potassium: 5.8 mEq/L — ABNORMAL HIGH (ref 3.5–5.1)
SODIUM: 136 meq/L (ref 136–145)
Total Protein: 8.5 g/dL — ABNORMAL HIGH (ref 6.4–8.3)

## 2015-08-17 LAB — CBC WITH DIFFERENTIAL/PLATELET
BASO%: 0.4 % (ref 0.0–2.0)
BASOS ABS: 0.1 10*3/uL (ref 0.0–0.1)
EOS ABS: 0.4 10*3/uL (ref 0.0–0.5)
EOS%: 3.1 % (ref 0.0–7.0)
HEMATOCRIT: 32.7 % — AB (ref 38.4–49.9)
HEMOGLOBIN: 10.6 g/dL — AB (ref 13.0–17.1)
LYMPH%: 18.4 % (ref 14.0–49.0)
MCH: 26.9 pg — AB (ref 27.2–33.4)
MCHC: 32.4 g/dL (ref 32.0–36.0)
MCV: 83 fL (ref 79.3–98.0)
MONO#: 1 10*3/uL — ABNORMAL HIGH (ref 0.1–0.9)
MONO%: 8 % (ref 0.0–14.0)
NEUT#: 8.7 10*3/uL — ABNORMAL HIGH (ref 1.5–6.5)
NEUT%: 70.1 % (ref 39.0–75.0)
Platelets: 317 10*3/uL (ref 140–400)
RBC: 3.94 10*6/uL — ABNORMAL LOW (ref 4.20–5.82)
RDW: 16.2 % — AB (ref 11.0–14.6)
WBC: 12.4 10*3/uL — ABNORMAL HIGH (ref 4.0–10.3)
lymph#: 2.3 10*3/uL (ref 0.9–3.3)

## 2015-08-17 MED ORDER — SODIUM CHLORIDE 0.9 % IJ SOLN
10.0000 mL | INTRAMUSCULAR | Status: DC | PRN
  Administered 2015-08-17: 10 mL
  Filled 2015-08-17: qty 10

## 2015-08-17 MED ORDER — SODIUM CHLORIDE 0.9 % IV SOLN
240.0000 mg | Freq: Once | INTRAVENOUS | Status: AC
  Administered 2015-08-17: 240 mg via INTRAVENOUS
  Filled 2015-08-17: qty 20

## 2015-08-17 MED ORDER — OXYCODONE-ACETAMINOPHEN 5-325 MG PO TABS: 1.0000 | ORAL_TABLET | ORAL | Status: DC | PRN

## 2015-08-17 MED ORDER — SODIUM CHLORIDE 0.9 % IV SOLN
Freq: Once | INTRAVENOUS | Status: AC
  Administered 2015-08-17: 10:00:00 via INTRAVENOUS

## 2015-08-17 MED ORDER — HEPARIN SOD (PORK) LOCK FLUSH 100 UNIT/ML IV SOLN
500.0000 [IU] | Freq: Once | INTRAVENOUS | Status: AC | PRN
  Administered 2015-08-17: 500 [IU]
  Filled 2015-08-17: qty 5

## 2015-08-17 MED ORDER — OXYCODONE-ACETAMINOPHEN 5-325 MG PO TABS
1.0000 | ORAL_TABLET | Freq: Once | ORAL | Status: AC
  Administered 2015-08-17: 1 via ORAL

## 2015-08-17 MED ORDER — OXYCODONE-ACETAMINOPHEN 5-325 MG PO TABS
ORAL_TABLET | ORAL | Status: AC
  Filled 2015-08-17: qty 1

## 2015-08-17 NOTE — Progress Notes (Signed)
Quick Note:  Call patient with the result and he needs to repeat B met for the K level in 1-2 days. ______

## 2015-08-17 NOTE — Progress Notes (Signed)
Reviewed labs. OK to treat with creatinine of 2.6 per Dr. Julien Nordmann

## 2015-08-17 NOTE — Progress Notes (Signed)
[place] [phone]   [phone]  OFFICE PROGRESS NOTE  [name], [name], MD [place]  [address]  DIAGNOSIS:  1) Stage IV (T2b, N2, M1b) non-small cell lung cancer, adenocarcinoma with negative EGFR mutation and negative gene translocation presented with a large right hilar mass as well as mediastinal lymphadenopathy and metastatic disease to the liver, bone and pancreas diagnosed in February 2016. 2) prostate adenocarcinoma diagnosed at [facility] with Gleason score 9 (4+5).  PRIOR THERAPY: Systemic chemotherapy with carboplatin for AUC of 5 and Alimta 500 MG/M2 every 3 weeks. Status post 6 cycles.  CURRENT THERAPY: Immunotherapy with Nivolumab 3 MG/KG every 2 weeks, status post 9 cycles.  INTERVAL HISTORY: Francisco Love 54 y.o. male returns to the clinic today for follow-up visit accompanied by his brother-in-law. The patient is tolerating his current treatment with immunotherapy with Nivolumab fairly well. He denied having any significant nausea or vomiting. He has no diarrhea. The patient denied having any fever or chills, no significant weight loss or night sweats. He has no chest pain, shortness of breath but has mild cough with no hemoptysis. He also has some sores in his mouth. He continues to have low back pain and currently on Percocet and requested refill of his medication. He had a prostate biopsy performed at [facility] and the final pathology was consistent with prostate adenocarcinoma with Gleason score ranging of 8 (4+4) to 9 (4+5). He is scheduled to see Dr. Jacelyn Grip in early January 2017 for discussion of his treatment options for the newly diagnosed prostate cancer. The patient is here today for evaluation before starting cycle #10.  MEDICAL HISTORY: Past Medical History  Diagnosis Date  . Shortness of breath dyspnea     ALLERGIES:  has No Known Allergies.  MEDICATIONS:  Current Outpatient  Prescriptions  Medication Sig Dispense Refill  . acetaminophen (TYLENOL) 325 MG tablet Take 2 tablets (650 mg total) by mouth every 6 (six) hours as needed for mild pain (or Fever >/= 101).    Marland Kitchen albuterol (PROVENTIL) (2.5 MG/3ML) 0.083% nebulizer solution Take 3 mLs (2.5 mg total) by nebulization every 2 (two) hours as needed for wheezing. 75 mL 12  . AMITIZA 24 MCG capsule     . clobetasol ointment (TEMOVATE) 0.05 % Apply topically.    . diphenhydrAMINE (BENADRYL) 25 MG tablet Take 25 mg by mouth.    . diphenhydrAMINE (SOMINEX) 25 MG tablet Take 25 mg by mouth as needed for itching or sleep.    . feeding supplement, ENSURE COMPLETE, (ENSURE COMPLETE) LIQD Take 237 mLs by mouth 3 (three) times daily between meals.    Marland Kitchen levofloxacin (LEVAQUIN) 500 MG tablet     . lidocaine-prilocaine (EMLA) cream Apply 1 application topically as needed. Apply small amount  to port site 1.5 hours prior to chemotherapy. Do not rub in cream . Cover with cling wrap 30 g 0  . mirtazapine (REMERON) 15 MG tablet Take 1 tablet (15 mg total) by mouth at bedtime.    Marland Kitchen oxyCODONE-acetaminophen (PERCOCET/ROXICET) 5-325 MG tablet Take 1 tablet by mouth every 4 (four) hours as needed for severe pain. 60 tablet 0  . polyethylene glycol (MIRALAX / GLYCOLAX) packet Take 17 g by mouth daily.    . prochlorperazine (COMPAZINE) 10 MG tablet Take 1 tablet (10 mg total) by mouth every 6 (six) hours as needed for nausea or vomiting. 30 tablet 1  . senna-docusate (SENOKOT-S) 8.6-50  MG per tablet Take 1 tablet by mouth daily.    . tamsulosin (FLOMAX) 0.4 MG CAPS capsule      No current facility-administered medications for this visit.    SURGICAL HISTORY:  Past Surgical History  Procedure Laterality Date  . Appendectomy    . Video bronchoscopy N/A 09/21/2014    Procedure: VIDEO BRONCHOSCOPY WITH FLUORO;  Surgeon: Rigoberto Noel, MD;  Location: [place];  Service: Cardiopulmonary;  Laterality: N/A;  . Video bronchoscopy with  endobronchial ultrasound N/A 09/23/2014    Procedure: VIDEO BRONCHOSCOPY WITH ENDOBRONCHIAL ULTRASOUND;  Surgeon: Rigoberto Noel, MD;  Location: [place];  Service: Pulmonary;  Laterality: N/A;    REVIEW OF SYSTEMS:  Constitutional: positive for fatigue Eyes: negative Ears, nose, mouth, throat, and face: positive for sore mouth Respiratory: negative Cardiovascular: negative Gastrointestinal: negative Genitourinary:negative Integument/breast: negative Hematologic/lymphatic: negative Musculoskeletal:negative Neurological: negative Behavioral/Psych: negative Endocrine: negative Allergic/Immunologic: negative   PHYSICAL EXAMINATION: General appearance: alert, cooperative, fatigued and no distress Head: Normocephalic, without obvious abnormality, atraumatic Neck: no adenopathy, no JVD, supple, symmetrical, trachea midline and thyroid not enlarged, symmetric, no tenderness/mass/nodules Lymph nodes: Cervical, supraclavicular, and axillary nodes normal. Resp: clear to auscultation bilaterally Back: symmetric, no curvature. ROM normal. No CVA tenderness. Cardio: regular rate and rhythm, S1, S2 normal, no murmur, click, rub or gallop GI: soft, non-tender; bowel sounds normal; no masses,  no organomegaly Extremities: extremities normal, atraumatic, no cyanosis or edema Neurologic: Alert and oriented X 3, normal strength and tone. Normal symmetric reflexes. Normal coordination and gait  ECOG PERFORMANCE STATUS: 2 - Symptomatic, <50% confined to bed  Blood pressure 119/78, pulse 100, temperature 97.6 F (36.4 C), temperature source Oral, resp. rate 18, height 5' 9" (1.753 m), weight 136 lb 3.2 oz (61.78 kg), SpO2 98 %.  LABORATORY DATA: Lab Results  Component Value Date   WBC 12.4* 08/17/2015   HGB 10.6* 08/17/2015   HCT 32.7* 08/17/2015   MCV 83.0 08/17/2015   PLT 317 08/17/2015      Chemistry      Component Value Date/Time   NA 136 08/03/2015 0838   NA 135 10/25/2014 0845   K 5.6 Sl.  Hemolysis* 08/03/2015 0838   K 5.3* 10/25/2014 0845   CL 108 10/25/2014 0845   CO2 16* 08/03/2015 0838   CO2 19 10/25/2014 0845   BUN 41.0* 08/03/2015 0838   BUN 22 10/25/2014 0845   CREATININE 2.7* 08/03/2015 0838   CREATININE 1.03 10/25/2014 0845      Component Value Date/Time   CALCIUM 9.0 08/03/2015 0838   CALCIUM 8.6 10/25/2014 0845   ALKPHOS 184* 08/03/2015 0838   ALKPHOS 882* 09/20/2014 1120   AST 36* 08/03/2015 0838   AST 29 09/20/2014 1120   ALT 41 08/03/2015 0838   ALT 34 09/20/2014 1120   BILITOT <0.30 08/03/2015 0838   BILITOT 0.1* 09/20/2014 1120       RADIOGRAPHIC STUDIES: Ct Abdomen Pelvis Wo Contrast  08/03/2015  CLINICAL DATA:  54 year old male with history of metastatic non-small cell lung cancer diagnosed in February 2016 undergoing ongoing chemotherapy. Prior smoker. EXAM: CT CHEST, ABDOMEN AND PELVIS WITHOUT CONTRAST TECHNIQUE: Multidetector CT imaging of the chest, abdomen and pelvis was performed following the standard protocol without IV contrast. COMPARISON:  CT of the chest, abdomen and pelvis tooth 06/06/2015. FINDINGS: CT CHEST FINDINGS Mediastinum/Lymph Nodes: Right paratracheal nodal mass measures up to 4.6 x 3.1 cm (image 18 of series 2), which is unchanged within measurement error compared to the prior examination.  Subcarinal lymph nodes measure up to 12 mm in short axis (slightly improved). Right hilar lymph nodes (densely calcified) measure up to 12 mm in short axis (slightly improved). No other new lymphadenopathy noted elsewhere in the mediastinal or left hilar regions on today's noncontrast CT examination. Please note that accurate exclusion of hilar adenopathy is limited on noncontrast CT scans. Heart size is normal. There is no significant pericardial fluid, thickening or pericardial calcification. There is atherosclerosis of the thoracic aorta, the great vessels of the mediastinum and the coronary arteries, including calcified atherosclerotic plaque  in the left anterior descending coronary artery. Esophagus is unremarkable in appearance. No axillary lymphadenopathy. Right internal jugular single-lumen porta cath with tip terminating in the superior aspect of the right atrium. Lungs/Pleura: 5 mm right lower lobe nodule (image 33 of series 4) is unchanged. Partially calcified 7 mm nodule in the left upper lobe (image 25 of series 4) is also unchanged, likely to represent a partially calcified granuloma. 3 mm subpleural nodule in the apex of the left upper lobe (image 8 of series 4) is unchanged. No other new suspicious appearing pulmonary nodules or masses are noted. No acute consolidative airspace disease. Small bilateral pleural effusions layering dependently (left greater than right) are unchanged. Mild centrilobular and paraseptal emphysema. Musculoskeletal/Soft Tissues: Again noted are innumerable predominantly sclerotic lesions throughout the visualized axial and appendicular skeleton, which appear grossly similar to the prior examination, compatible with widespread metastatic disease to the bones. This is most confluent at T1 and T11 where the entire vertebral bodies are involved at both levels. CT ABDOMEN AND PELVIS FINDINGS Hepatobiliary: No definite cystic or solid hepatic lesions are identified on today's noncontrast CT examination. Unenhanced appearance of the gallbladder is normal. Pancreas: Calcifications are again noted in the region of the head of the pancreas, poorly evaluated on today's noncontrast CT examination, but similar to several prior studies, favored to be related to prior episodes of pancreatitis. No peripancreatic inflammatory changes are identified on today's noncontrast CT examination. Spleen: Unremarkable. Adrenals/Urinary Tract: Bilateral adrenal glands and the right kidney are normal in appearance on today's noncontrast CT examination. Mild atrophy of the left kidney is unchanged. No hydroureteronephrosis. Unenhanced appearance  of the urinary bladder is normal. Stomach/Bowel: Unenhanced appearance of the stomach is normal. No pathologic dilatation of small bowel or colon. Vascular/Lymphatic: Atherosclerotic calcifications throughout the abdominal and pelvic vasculature, without evidence of aneurysm. No definite lymphadenopathy noted in the abdomen or pelvis on today's noncontrast CT examination. Reproductive: Prostate gland and seminal vesicles are unremarkable in appearance. Other: No significant volume of ascites.  No pneumoperitoneum. Musculoskeletal: Widespread sclerotic lesions are again noted throughout the visualized axial and appendicular skeleton, very similar to the prior study, compatible with widespread metastatic disease to the bones. Severe joint space narrowing, subchondral sclerosis, subchondral cyst formation and osteophyte formation is again noted in both hip joints, compatible with advanced osteoarthritis. In addition, there is collapse of the weight-bearing surfaces of the femoral heads bilaterally, suggestive of avascular necrosis (unchanged). IMPRESSION: 1. Today's examination demonstrates stable to slightly improved mediastinal and right hilar lymphadenopathy, as discussed above. Additionally, all previously noted tiny pulmonary nodules are unchanged. Small bilateral pleural effusions (left greater than right) are also unchanged. 2. Widespread metastatic disease to the bones appear similar to the prior examination. 3. No definite new metastatic disease noted in the chest, abdomen or pelvis on today's examination. 4. Additional incidental findings, similar to the prior study, as above. Electronically Signed   By: Vinnie Langton  M.D.   On: 08/03/2015 07:54   Ct Chest Wo Contrast  08/03/2015  CLINICAL DATA:  54 year old male with history of metastatic non-small cell lung cancer diagnosed in February 2016 undergoing ongoing chemotherapy. Prior smoker. EXAM: CT CHEST, ABDOMEN AND PELVIS WITHOUT CONTRAST TECHNIQUE:  Multidetector CT imaging of the chest, abdomen and pelvis was performed following the standard protocol without IV contrast. COMPARISON:  CT of the chest, abdomen and pelvis tooth 06/06/2015. FINDINGS: CT CHEST FINDINGS Mediastinum/Lymph Nodes: Right paratracheal nodal mass measures up to 4.6 x 3.1 cm (image 18 of series 2), which is unchanged within measurement error compared to the prior examination. Subcarinal lymph nodes measure up to 12 mm in short axis (slightly improved). Right hilar lymph nodes (densely calcified) measure up to 12 mm in short axis (slightly improved). No other new lymphadenopathy noted elsewhere in the mediastinal or left hilar regions on today's noncontrast CT examination. Please note that accurate exclusion of hilar adenopathy is limited on noncontrast CT scans. Heart size is normal. There is no significant pericardial fluid, thickening or pericardial calcification. There is atherosclerosis of the thoracic aorta, the great vessels of the mediastinum and the coronary arteries, including calcified atherosclerotic plaque in the left anterior descending coronary artery. Esophagus is unremarkable in appearance. No axillary lymphadenopathy. Right internal jugular single-lumen porta cath with tip terminating in the superior aspect of the right atrium. Lungs/Pleura: 5 mm right lower lobe nodule (image 33 of series 4) is unchanged. Partially calcified 7 mm nodule in the left upper lobe (image 25 of series 4) is also unchanged, likely to represent a partially calcified granuloma. 3 mm subpleural nodule in the apex of the left upper lobe (image 8 of series 4) is unchanged. No other new suspicious appearing pulmonary nodules or masses are noted. No acute consolidative airspace disease. Small bilateral pleural effusions layering dependently (left greater than right) are unchanged. Mild centrilobular and paraseptal emphysema. Musculoskeletal/Soft Tissues: Again noted are innumerable predominantly  sclerotic lesions throughout the visualized axial and appendicular skeleton, which appear grossly similar to the prior examination, compatible with widespread metastatic disease to the bones. This is most confluent at T1 and T11 where the entire vertebral bodies are involved at both levels. CT ABDOMEN AND PELVIS FINDINGS Hepatobiliary: No definite cystic or solid hepatic lesions are identified on today's noncontrast CT examination. Unenhanced appearance of the gallbladder is normal. Pancreas: Calcifications are again noted in the region of the head of the pancreas, poorly evaluated on today's noncontrast CT examination, but similar to several prior studies, favored to be related to prior episodes of pancreatitis. No peripancreatic inflammatory changes are identified on today's noncontrast CT examination. Spleen: Unremarkable. Adrenals/Urinary Tract: Bilateral adrenal glands and the right kidney are normal in appearance on today's noncontrast CT examination. Mild atrophy of the left kidney is unchanged. No hydroureteronephrosis. Unenhanced appearance of the urinary bladder is normal. Stomach/Bowel: Unenhanced appearance of the stomach is normal. No pathologic dilatation of small bowel or colon. Vascular/Lymphatic: Atherosclerotic calcifications throughout the abdominal and pelvic vasculature, without evidence of aneurysm. No definite lymphadenopathy noted in the abdomen or pelvis on today's noncontrast CT examination. Reproductive: Prostate gland and seminal vesicles are unremarkable in appearance. Other: No significant volume of ascites.  No pneumoperitoneum. Musculoskeletal: Widespread sclerotic lesions are again noted throughout the visualized axial and appendicular skeleton, very similar to the prior study, compatible with widespread metastatic disease to the bones. Severe joint space narrowing, subchondral sclerosis, subchondral cyst formation and osteophyte formation is again noted in both hip joints,  compatible with advanced osteoarthritis. In addition, there is collapse of the weight-bearing surfaces of the femoral heads bilaterally, suggestive of avascular necrosis (unchanged). IMPRESSION: 1. Today's examination demonstrates stable to slightly improved mediastinal and right hilar lymphadenopathy, as discussed above. Additionally, all previously noted tiny pulmonary nodules are unchanged. Small bilateral pleural effusions (left greater than right) are also unchanged. 2. Widespread metastatic disease to the bones appear similar to the prior examination. 3. No definite new metastatic disease noted in the chest, abdomen or pelvis on today's examination. 4. Additional incidental findings, similar to the prior study, as above. Electronically Signed   By: Vinnie Langton M.D.   On: 08/03/2015 07:54    ASSESSMENT AND PLAN: This is a very pleasant 54 years old African-American male with: 1)  metastatic non-small cell lung cancer, adenocarcinoma status post induction chemotherapy with carboplatin and Alimta with initial good response followed by disease progression. The patient is currently on second line treatment with immunotherapy with Nivolumab status post 9 cycles. He tolerated the last cycle of his treatment fairly well with no concerning adverse effects. We will proceed with cycle #10 today as a scheduled. 2) recently diagnosed prostate adenocarcinoma: He is [facility] for this problem. 3) pain management, he would continue on Percocet as needed. He was given a refill of this medication today. 4) metastatic bone disease, I discussed with the patient consideration of treatment with Xgeva after receive dental clearance. He is better reluctant about new treatment. The patient would come back for follow-up visit in 2 weeks for reevaluation before starting cycle #11. He was advised to call immediately if he has any concerning symptoms in the interval. The patient voices understanding of  current disease status and treatment options and is in agreement with the current care plan.  All questions were answered. The patient knows to call the clinic with any problems, questions or concerns. We can certainly see the patient much sooner if necessary.  Disclaimer: This note was dictated with voice recognition software. Similar sounding words can inadvertently be transcribed and may not be corrected upon review.

## 2015-08-17 NOTE — Patient Instructions (Signed)
[facility] Discharge Instructions for Patients Receiving Chemotherapy  Today you received the following chemotherapy agents:  Nivolumab.  To help prevent nausea and vomiting after your treatment, we encourage you to take your nausea medication as directed.   If you develop nausea and vomiting that is not controlled by your nausea medication, call the clinic.   BELOW ARE SYMPTOMS THAT SHOULD BE REPORTED IMMEDIATELY:  *FEVER GREATER THAN 100.5 F  *CHILLS WITH OR WITHOUT FEVER  NAUSEA AND VOMITING THAT IS NOT CONTROLLED WITH YOUR NAUSEA MEDICATION  *UNUSUAL SHORTNESS OF BREATH  *UNUSUAL BRUISING OR BLEEDING  TENDERNESS IN MOUTH AND THROAT WITH OR WITHOUT PRESENCE OF ULCERS  *URINARY PROBLEMS  *BOWEL PROBLEMS  UNUSUAL RASH Items with * indicate a potential emergency and should be followed up as soon as possible.  Feel free to call the clinic you have any questions or concerns. The clinic phone number is [phone].  Please show the CHEMO ALERT CARD at check-in to the Emergency Department and triage nurse.   

## 2015-08-18 ENCOUNTER — Ambulatory Visit (HOSPITAL_BASED_OUTPATIENT_CLINIC_OR_DEPARTMENT_OTHER): Payer: Medicare Other

## 2015-08-18 DIAGNOSIS — C3401 Malignant neoplasm of right main bronchus: Secondary | ICD-10-CM | POA: Diagnosis present

## 2015-08-18 DIAGNOSIS — C7951 Secondary malignant neoplasm of bone: Secondary | ICD-10-CM

## 2015-08-18 DIAGNOSIS — C801 Malignant (primary) neoplasm, unspecified: Secondary | ICD-10-CM

## 2015-08-18 DIAGNOSIS — C3491 Malignant neoplasm of unspecified part of right bronchus or lung: Secondary | ICD-10-CM

## 2015-08-18 LAB — CBC WITH DIFFERENTIAL/PLATELET
BASO%: 1.1 % (ref 0.0–2.0)
Basophils Absolute: 0.1 10*3/uL (ref 0.0–0.1)
EOS ABS: 0.5 10*3/uL (ref 0.0–0.5)
EOS%: 3.5 % (ref 0.0–7.0)
HCT: 34 % — ABNORMAL LOW (ref 38.4–49.9)
HGB: 10.6 g/dL — ABNORMAL LOW (ref 13.0–17.1)
LYMPH%: 23.6 % (ref 14.0–49.0)
MCH: 26.4 pg — AB (ref 27.2–33.4)
MCHC: 31.3 g/dL — AB (ref 32.0–36.0)
MCV: 84.4 fL (ref 79.3–98.0)
MONO#: 1 10*3/uL — AB (ref 0.1–0.9)
MONO%: 7.9 % (ref 0.0–14.0)
NEUT#: 8.3 10*3/uL — ABNORMAL HIGH (ref 1.5–6.5)
NEUT%: 63.9 % (ref 39.0–75.0)
PLATELETS: 333 10*3/uL (ref 140–400)
RBC: 4.03 10*6/uL — AB (ref 4.20–5.82)
RDW: 16.6 % — ABNORMAL HIGH (ref 11.0–14.6)
WBC: 12.9 10*3/uL — ABNORMAL HIGH (ref 4.0–10.3)
lymph#: 3.1 10*3/uL (ref 0.9–3.3)

## 2015-08-18 LAB — BASIC METABOLIC PANEL
Anion Gap: 10 mEq/L (ref 3–11)
BUN: 44.9 mg/dL — AB (ref 7.0–26.0)
CHLORIDE: 110 meq/L — AB (ref 98–109)
CO2: 16 mEq/L — ABNORMAL LOW (ref 22–29)
CREATININE: 2.7 mg/dL — AB (ref 0.7–1.3)
Calcium: 9.1 mg/dL (ref 8.4–10.4)
EGFR: 30 mL/min/{1.73_m2} — ABNORMAL LOW (ref >90)
GLUCOSE: 83 mg/dL (ref 70–140)
POTASSIUM: 5.3 meq/L — AB (ref 3.5–5.1)
Sodium: 136 mEq/L (ref 136–145)

## 2015-08-18 NOTE — Progress Notes (Unsigned)
Notified pt K+ elevated, will need to return to lab for repeat BMET. Pt requested I call Darryl ( brother in law) to give information to also. Pt put Darryl on phone, requested pt return for repeat lab work. Darryl requested appt today at 4pm. POF to scheduling;

## 2015-08-24 ENCOUNTER — Telehealth: Payer: Self-pay | Admitting: Medical Oncology

## 2015-08-24 ENCOUNTER — Telehealth: Payer: Self-pay | Admitting: Internal Medicine

## 2015-08-24 DIAGNOSIS — E875 Hyperkalemia: Secondary | ICD-10-CM

## 2015-08-24 NOTE — Telephone Encounter (Signed)
-----  Message from Curt Bears, MD sent at 08/17/2015  4:50 PM EST ----- Call patient with the result and he needs to repeat B met for the K level in 1-2 days.

## 2015-08-24 NOTE — Telephone Encounter (Signed)
Pt stated he cannot come in this week for repeat bmet-he does not have transportation. I instructed him on cutting out bananas, other fruit , milk and potatoes in his diet.

## 2015-08-24 NOTE — Telephone Encounter (Signed)
Spoke with patient and his brother and per the brother they cannot keep coming up here from [place],i have sent his call to the desk nurse

## 2015-08-31 ENCOUNTER — Other Ambulatory Visit: Payer: Self-pay | Admitting: *Deleted

## 2015-08-31 DIAGNOSIS — C3491 Malignant neoplasm of unspecified part of right bronchus or lung: Secondary | ICD-10-CM

## 2015-09-01 ENCOUNTER — Other Ambulatory Visit (HOSPITAL_BASED_OUTPATIENT_CLINIC_OR_DEPARTMENT_OTHER): Payer: Medicare Other

## 2015-09-01 ENCOUNTER — Other Ambulatory Visit: Payer: Self-pay | Admitting: *Deleted

## 2015-09-01 ENCOUNTER — Telehealth: Payer: Self-pay | Admitting: Internal Medicine

## 2015-09-01 ENCOUNTER — Encounter: Payer: Self-pay | Admitting: Internal Medicine

## 2015-09-01 ENCOUNTER — Ambulatory Visit (HOSPITAL_BASED_OUTPATIENT_CLINIC_OR_DEPARTMENT_OTHER): Payer: Medicare Other | Admitting: Internal Medicine

## 2015-09-01 ENCOUNTER — Ambulatory Visit (HOSPITAL_BASED_OUTPATIENT_CLINIC_OR_DEPARTMENT_OTHER): Payer: Medicare Other

## 2015-09-01 VITALS — BP 95/71 | HR 96 | Temp 98.2°F | Resp 18 | Ht 69.0 in | Wt 136.7 lb

## 2015-09-01 DIAGNOSIS — C787 Secondary malignant neoplasm of liver and intrahepatic bile duct: Secondary | ICD-10-CM | POA: Diagnosis not present

## 2015-09-01 DIAGNOSIS — Z5112 Encounter for antineoplastic immunotherapy: Secondary | ICD-10-CM | POA: Diagnosis present

## 2015-09-01 DIAGNOSIS — G893 Neoplasm related pain (acute) (chronic): Secondary | ICD-10-CM

## 2015-09-01 DIAGNOSIS — C3401 Malignant neoplasm of right main bronchus: Secondary | ICD-10-CM

## 2015-09-01 DIAGNOSIS — C3491 Malignant neoplasm of unspecified part of right bronchus or lung: Secondary | ICD-10-CM

## 2015-09-01 DIAGNOSIS — C7800 Secondary malignant neoplasm of unspecified lung: Secondary | ICD-10-CM | POA: Diagnosis not present

## 2015-09-01 DIAGNOSIS — C801 Malignant (primary) neoplasm, unspecified: Principal | ICD-10-CM

## 2015-09-01 DIAGNOSIS — C7951 Secondary malignant neoplasm of bone: Secondary | ICD-10-CM | POA: Diagnosis not present

## 2015-09-01 DIAGNOSIS — N489 Disorder of penis, unspecified: Secondary | ICD-10-CM

## 2015-09-01 DIAGNOSIS — C7889 Secondary malignant neoplasm of other digestive organs: Secondary | ICD-10-CM

## 2015-09-01 LAB — COMPREHENSIVE METABOLIC PANEL
ALT: 15 U/L (ref 0–55)
AST: 15 U/L (ref 5–34)
Albumin: 3 g/dL — ABNORMAL LOW (ref 3.5–5.0)
Alkaline Phosphatase: 285 U/L — ABNORMAL HIGH (ref 40–150)
Anion Gap: 10 mEq/L (ref 3–11)
BUN: 51.1 mg/dL — AB (ref 7.0–26.0)
CHLORIDE: 108 meq/L (ref 98–109)
CO2: 16 mEq/L — ABNORMAL LOW (ref 22–29)
Calcium: 8.4 mg/dL (ref 8.4–10.4)
Creatinine: 2.5 mg/dL — ABNORMAL HIGH (ref 0.7–1.3)
EGFR: 31 mL/min/{1.73_m2} — AB (ref >90)
GLUCOSE: 120 mg/dL (ref 70–140)
POTASSIUM: 4.8 meq/L (ref 3.5–5.1)
SODIUM: 134 meq/L — AB (ref 136–145)
Total Bilirubin: <0.3 mg/dL (ref 0.20–1.20)
Total Protein: 8.2 g/dL (ref 6.4–8.3)

## 2015-09-01 LAB — CBC WITH DIFFERENTIAL/PLATELET
BASO%: 0.5 % (ref 0.0–2.0)
BASOS ABS: 0.1 10*3/uL (ref 0.0–0.1)
EOS%: 4.6 % (ref 0.0–7.0)
Eosinophils Absolute: 0.5 10*3/uL (ref 0.0–0.5)
HCT: 32.1 % — ABNORMAL LOW (ref 38.4–49.9)
HEMOGLOBIN: 10.4 g/dL — AB (ref 13.0–17.1)
LYMPH%: 22.6 % (ref 14.0–49.0)
MCH: 27.2 pg (ref 27.2–33.4)
MCHC: 32.4 g/dL (ref 32.0–36.0)
MCV: 83.8 fL (ref 79.3–98.0)
MONO#: 0.9 10*3/uL (ref 0.1–0.9)
MONO%: 8.5 % (ref 0.0–14.0)
NEUT%: 63.8 % (ref 39.0–75.0)
NEUTROS ABS: 7 10*3/uL — AB (ref 1.5–6.5)
Platelets: 311 10*3/uL (ref 140–400)
RBC: 3.83 10*6/uL — AB (ref 4.20–5.82)
RDW: 16.4 % — AB (ref 11.0–14.6)
WBC: 10.9 10*3/uL — AB (ref 4.0–10.3)
lymph#: 2.5 10*3/uL (ref 0.9–3.3)

## 2015-09-01 LAB — TSH: TSH: 1.762 m[IU]/L (ref 0.320–4.118)

## 2015-09-01 MED ORDER — OXYCODONE-ACETAMINOPHEN 5-325 MG PO TABS: 1.0000 | ORAL_TABLET | ORAL | Status: DC | PRN

## 2015-09-01 MED ORDER — HEPARIN SOD (PORK) LOCK FLUSH 100 UNIT/ML IV SOLN
500.0000 [IU] | Freq: Once | INTRAVENOUS | Status: AC | PRN
  Administered 2015-09-01: 500 [IU]
  Filled 2015-09-01: qty 5

## 2015-09-01 MED ORDER — SODIUM CHLORIDE 0.9 % IJ SOLN
10.0000 mL | INTRAMUSCULAR | Status: DC | PRN
  Administered 2015-09-01: 10 mL
  Filled 2015-09-01: qty 10

## 2015-09-01 MED ORDER — SODIUM CHLORIDE 0.9 % IV SOLN
Freq: Once | INTRAVENOUS | Status: AC
  Administered 2015-09-01: 10:00:00 via INTRAVENOUS

## 2015-09-01 MED ORDER — SODIUM CHLORIDE 0.9 % IV SOLN
240.0000 mg | Freq: Once | INTRAVENOUS | Status: AC
  Administered 2015-09-01: 240 mg via INTRAVENOUS
  Filled 2015-09-01: qty 24

## 2015-09-01 NOTE — Telephone Encounter (Signed)
Gave patient avs report and appointments for January thru March.  °

## 2015-09-01 NOTE — Progress Notes (Signed)
Creatinine 2.5, Dr. Julien Nordmann aware okay to proceed with treatment per Dr. Julien Nordmann.

## 2015-09-01 NOTE — Progress Notes (Signed)
Ok to treat with CBC/CMET per [facility]

## 2015-09-01 NOTE — Patient Instructions (Signed)
[facility] Discharge Instructions for Patients Receiving Chemotherapy  Today you received the following chemotherapy agents:  Nivolumab.  To help prevent nausea and vomiting after your treatment, we encourage you to take your nausea medication as directed.   If you develop nausea and vomiting that is not controlled by your nausea medication, call the clinic.   BELOW ARE SYMPTOMS THAT SHOULD BE REPORTED IMMEDIATELY:  *FEVER GREATER THAN 100.5 F  *CHILLS WITH OR WITHOUT FEVER  NAUSEA AND VOMITING THAT IS NOT CONTROLLED WITH YOUR NAUSEA MEDICATION  *UNUSUAL SHORTNESS OF BREATH  *UNUSUAL BRUISING OR BLEEDING  TENDERNESS IN MOUTH AND THROAT WITH OR WITHOUT PRESENCE OF ULCERS  *URINARY PROBLEMS  *BOWEL PROBLEMS  UNUSUAL RASH Items with * indicate a potential emergency and should be followed up as soon as possible.  Feel free to call the clinic you have any questions or concerns. The clinic phone number is [phone].  Please show the CHEMO ALERT CARD at check-in to the Emergency Department and triage nurse.   

## 2015-09-01 NOTE — Progress Notes (Signed)
    [facility] [phone]   [phone]  OFFICE PROGRESS NOTE  Strachan Jr, Kenneth Francis, MD [address] [place] [address]  DIAGNOSIS:  1) Stage IV (T2b, N2, M1b) non-small cell lung cancer, adenocarcinoma with negative EGFR mutation and negative gene translocation presented with a large right hilar mass as well as mediastinal lymphadenopathy and metastatic disease to the liver, bone and pancreas diagnosed in February 2016. 2) prostate adenocarcinoma diagnosed at [facility] with Gleason score 9 (4+5).  PRIOR THERAPY: Systemic chemotherapy with carboplatin for AUC of 5 and Alimta 500 MG/M2 every 3 weeks. Status post 6 cycles.  CURRENT THERAPY: Immunotherapy with Nivolumab 3 MG/KG every 2 weeks, status post 10 cycles.  INTERVAL HISTORY: Francisco Love 55 y.o. male returns to the clinic today for follow-up visit accompanied by his brother-in-law. The patient is tolerating his current treatment with immunotherapy with Nivolumab fairly well. He denied having any significant nausea or vomiting. He has no diarrhea. The patient denied having any fever or chills, no significant weight loss or night sweats. He has no chest pain, shortness of breath but has mild cough with no hemoptysis. He was seen recently by Dr. Heather Schultz at [facility] and was also found to have benign lesion suspicious for wart versus malignancy. He is scheduled for a biopsy and evaluation by dermatology in the next few weeks. The patient is here today for evaluation before starting cycle #11. He is requesting refill of his pain medication.  MEDICAL HISTORY: Past Medical History  Diagnosis Date  . Shortness of breath dyspnea     ALLERGIES:  has No Known Allergies.  MEDICATIONS:  Current Outpatient Prescriptions  Medication Sig Dispense Refill  . acetaminophen (TYLENOL) 325 MG tablet Take 2 tablets (650 mg total) by mouth every 6 (six) hours as needed for mild pain (or  Fever >/= 101).    . albuterol (PROVENTIL) (2.5 MG/3ML) 0.083% nebulizer solution Take 3 mLs (2.5 mg total) by nebulization every 2 (two) hours as needed for wheezing. 75 mL 12  . AMITIZA 24 MCG capsule     . clobetasol ointment (TEMOVATE) 0.05 % Apply topically.    . diphenhydrAMINE (BENADRYL) 25 MG tablet Take 25 mg by mouth.    . diphenhydrAMINE (SOMINEX) 25 MG tablet Take 25 mg by mouth as needed for itching or sleep.    . feeding supplement, ENSURE COMPLETE, (ENSURE COMPLETE) LIQD Take 237 mLs by mouth 3 (three) times daily between meals.    . levofloxacin (LEVAQUIN) 500 MG tablet     . lidocaine-prilocaine (EMLA) cream Apply 1 application topically as needed. Apply small amount  to port site 1.5 hours prior to chemotherapy. Do not rub in cream . Cover with cling wrap 30 g 0  . mirtazapine (REMERON) 15 MG tablet Take 1 tablet (15 mg total) by mouth at bedtime.    . oxyCODONE-acetaminophen (PERCOCET/ROXICET) 5-325 MG tablet Take 1 tablet by mouth every 4 (four) hours as needed for severe pain. 60 tablet 0  . polyethylene glycol (MIRALAX / GLYCOLAX) packet Take 17 g by mouth daily.    . prochlorperazine (COMPAZINE) 10 MG tablet Take 1 tablet (10 mg total) by mouth every 6 (six) hours as needed for nausea or vomiting. 30 tablet 1  . senna-docusate (SENOKOT-S) 8.6-50 MG per tablet Take 1 tablet by mouth daily.    . tamsulosin (FLOMAX) 0.4 MG CAPS capsule      No current facility-administered medications for   this visit.    SURGICAL HISTORY:  Past Surgical History  Procedure Laterality Date  . Appendectomy    . Video bronchoscopy N/A 09/21/2014    Procedure: VIDEO BRONCHOSCOPY WITH FLUORO;  Surgeon: Rakesh Alva V, MD;  Location: [facility] ENDOSCOPY;  Service: Cardiopulmonary;  Laterality: N/A;  . Video bronchoscopy with endobronchial ultrasound N/A 09/23/2014    Procedure: VIDEO BRONCHOSCOPY WITH ENDOBRONCHIAL ULTRASOUND;  Surgeon: Rakesh Alva V, MD;  Location: [facility] OR;  Service: Pulmonary;  Laterality:  N/A;    REVIEW OF SYSTEMS:  A comprehensive review of systems was negative except for: Constitutional: positive for fatigue Musculoskeletal: positive for back pain   PHYSICAL EXAMINATION: General appearance: alert, cooperative, fatigued and no distress Head: Normocephalic, without obvious abnormality, atraumatic Neck: no adenopathy, no JVD, supple, symmetrical, trachea midline and thyroid not enlarged, symmetric, no tenderness/mass/nodules Lymph nodes: Cervical, supraclavicular, and axillary nodes normal. Resp: clear to auscultation bilaterally Back: symmetric, no curvature. ROM normal. No CVA tenderness. Cardio: regular rate and rhythm, S1, S2 normal, no murmur, click, rub or gallop GI: soft, non-tender; bowel sounds normal; no masses,  no organomegaly Extremities: extremities normal, atraumatic, no cyanosis or edema Neurologic: Alert and oriented X 3, normal strength and tone. Normal symmetric reflexes. Normal coordination and gait  ECOG PERFORMANCE STATUS: 2 - Symptomatic, <50% confined to bed  Blood pressure 95/71, pulse 96, temperature 98.2 F (36.8 C), temperature source Oral, resp. rate 18, height 5' 9" (1.753 m), weight 136 lb 11.2 oz (62.007 kg), SpO2 100 %.  LABORATORY DATA: Lab Results  Component Value Date   WBC 10.9* 09/01/2015   HGB 10.4* 09/01/2015   HCT 32.1* 09/01/2015   MCV 83.8 09/01/2015   PLT 311 09/01/2015      Chemistry      Component Value Date/Time   NA 134* 09/01/2015 0813   NA 135 10/25/2014 0845   K 4.8 09/01/2015 0813   K 5.3* 10/25/2014 0845   CL 108 10/25/2014 0845   CO2 16* 09/01/2015 0813   CO2 19 10/25/2014 0845   BUN 51.1* 09/01/2015 0813   BUN 22 10/25/2014 0845   CREATININE 2.5* 09/01/2015 0813   CREATININE 1.03 10/25/2014 0845      Component Value Date/Time   CALCIUM 8.4 09/01/2015 0813   CALCIUM 8.6 10/25/2014 0845   ALKPHOS 285* 09/01/2015 0813   ALKPHOS 882* 09/20/2014 1120   AST 15 09/01/2015 0813   AST 29 09/20/2014 1120     ALT 15 09/01/2015 0813   ALT 34 09/20/2014 1120   BILITOT <0.30 09/01/2015 0813   BILITOT 0.1* 09/20/2014 1120       RADIOGRAPHIC STUDIES: Ct Abdomen Pelvis Wo Contrast  08/03/2015  CLINICAL DATA:  54-year-old male with history of metastatic non-small cell lung cancer diagnosed in February 2016 undergoing ongoing chemotherapy. Prior smoker. EXAM: CT CHEST, ABDOMEN AND PELVIS WITHOUT CONTRAST TECHNIQUE: Multidetector CT imaging of the chest, abdomen and pelvis was performed following the standard protocol without IV contrast. COMPARISON:  CT of the chest, abdomen and pelvis tooth 06/06/2015. FINDINGS: CT CHEST FINDINGS Mediastinum/Lymph Nodes: Right paratracheal nodal mass measures up to 4.6 x 3.1 cm (image 18 of series 2), which is unchanged within measurement error compared to the prior examination. Subcarinal lymph nodes measure up to 12 mm in short axis (slightly improved). Right hilar lymph nodes (densely calcified) measure up to 12 mm in short axis (slightly improved). No other new lymphadenopathy noted elsewhere in the mediastinal or left hilar regions on today's noncontrast CT examination. Please   note that accurate exclusion of hilar adenopathy is limited on noncontrast CT scans. Heart size is normal. There is no significant pericardial fluid, thickening or pericardial calcification. There is atherosclerosis of the thoracic aorta, the great vessels of the mediastinum and the coronary arteries, including calcified atherosclerotic plaque in the left anterior descending coronary artery. Esophagus is unremarkable in appearance. No axillary lymphadenopathy. Right internal jugular single-lumen porta cath with tip terminating in the superior aspect of the right atrium. Lungs/Pleura: 5 mm right lower lobe nodule (image 33 of series 4) is unchanged. Partially calcified 7 mm nodule in the left upper lobe (image 25 of series 4) is also unchanged, likely to represent a partially calcified granuloma. 3 mm  subpleural nodule in the apex of the left upper lobe (image 8 of series 4) is unchanged. No other new suspicious appearing pulmonary nodules or masses are noted. No acute consolidative airspace disease. Small bilateral pleural effusions layering dependently (left greater than right) are unchanged. Mild centrilobular and paraseptal emphysema. Musculoskeletal/Soft Tissues: Again noted are innumerable predominantly sclerotic lesions throughout the visualized axial and appendicular skeleton, which appear grossly similar to the prior examination, compatible with widespread metastatic disease to the bones. This is most confluent at T1 and T11 where the entire vertebral bodies are involved at both levels. CT ABDOMEN AND PELVIS FINDINGS Hepatobiliary: No definite cystic or solid hepatic lesions are identified on today's noncontrast CT examination. Unenhanced appearance of the gallbladder is normal. Pancreas: Calcifications are again noted in the region of the head of the pancreas, poorly evaluated on today's noncontrast CT examination, but similar to several prior studies, favored to be related to prior episodes of pancreatitis. No peripancreatic inflammatory changes are identified on today's noncontrast CT examination. Spleen: Unremarkable. Adrenals/Urinary Tract: Bilateral adrenal glands and the right kidney are normal in appearance on today's noncontrast CT examination. Mild atrophy of the left kidney is unchanged. No hydroureteronephrosis. Unenhanced appearance of the urinary bladder is normal. Stomach/Bowel: Unenhanced appearance of the stomach is normal. No pathologic dilatation of small bowel or colon. Vascular/Lymphatic: Atherosclerotic calcifications throughout the abdominal and pelvic vasculature, without evidence of aneurysm. No definite lymphadenopathy noted in the abdomen or pelvis on today's noncontrast CT examination. Reproductive: Prostate gland and seminal vesicles are unremarkable in appearance. Other: No  significant volume of ascites.  No pneumoperitoneum. Musculoskeletal: Widespread sclerotic lesions are again noted throughout the visualized axial and appendicular skeleton, very similar to the prior study, compatible with widespread metastatic disease to the bones. Severe joint space narrowing, subchondral sclerosis, subchondral cyst formation and osteophyte formation is again noted in both hip joints, compatible with advanced osteoarthritis. In addition, there is collapse of the weight-bearing surfaces of the femoral heads bilaterally, suggestive of avascular necrosis (unchanged). IMPRESSION: 1. Today's examination demonstrates stable to slightly improved mediastinal and right hilar lymphadenopathy, as discussed above. Additionally, all previously noted tiny pulmonary nodules are unchanged. Small bilateral pleural effusions (left greater than right) are also unchanged. 2. Widespread metastatic disease to the bones appear similar to the prior examination. 3. No definite new metastatic disease noted in the chest, abdomen or pelvis on today's examination. 4. Additional incidental findings, similar to the prior study, as above. Electronically Signed   By: Vinnie Langton M.D.   On: 08/03/2015 07:54   Ct Chest Wo Contrast  08/03/2015  CLINICAL DATA:  55 year old male with history of metastatic non-small cell lung cancer diagnosed in February 2016 undergoing ongoing chemotherapy. Prior smoker. EXAM: CT CHEST, ABDOMEN AND PELVIS WITHOUT CONTRAST TECHNIQUE: Multidetector CT  imaging of the chest, abdomen and pelvis was performed following the standard protocol without IV contrast. COMPARISON:  CT of the chest, abdomen and pelvis tooth 06/06/2015. FINDINGS: CT CHEST FINDINGS Mediastinum/Lymph Nodes: Right paratracheal nodal mass measures up to 4.6 x 3.1 cm (image 18 of series 2), which is unchanged within measurement error compared to the prior examination. Subcarinal lymph nodes measure up to 12 mm in short axis  (slightly improved). Right hilar lymph nodes (densely calcified) measure up to 12 mm in short axis (slightly improved). No other new lymphadenopathy noted elsewhere in the mediastinal or left hilar regions on today's noncontrast CT examination. Please note that accurate exclusion of hilar adenopathy is limited on noncontrast CT scans. Heart size is normal. There is no significant pericardial fluid, thickening or pericardial calcification. There is atherosclerosis of the thoracic aorta, the great vessels of the mediastinum and the coronary arteries, including calcified atherosclerotic plaque in the left anterior descending coronary artery. Esophagus is unremarkable in appearance. No axillary lymphadenopathy. Right internal jugular single-lumen porta cath with tip terminating in the superior aspect of the right atrium. Lungs/Pleura: 5 mm right lower lobe nodule (image 33 of series 4) is unchanged. Partially calcified 7 mm nodule in the left upper lobe (image 25 of series 4) is also unchanged, likely to represent a partially calcified granuloma. 3 mm subpleural nodule in the apex of the left upper lobe (image 8 of series 4) is unchanged. No other new suspicious appearing pulmonary nodules or masses are noted. No acute consolidative airspace disease. Small bilateral pleural effusions layering dependently (left greater than right) are unchanged. Mild centrilobular and paraseptal emphysema. Musculoskeletal/Soft Tissues: Again noted are innumerable predominantly sclerotic lesions throughout the visualized axial and appendicular skeleton, which appear grossly similar to the prior examination, compatible with widespread metastatic disease to the bones. This is most confluent at T1 and T11 where the entire vertebral bodies are involved at both levels. CT ABDOMEN AND PELVIS FINDINGS Hepatobiliary: No definite cystic or solid hepatic lesions are identified on today's noncontrast CT examination. Unenhanced appearance of the  gallbladder is normal. Pancreas: Calcifications are again noted in the region of the head of the pancreas, poorly evaluated on today's noncontrast CT examination, but similar to several prior studies, favored to be related to prior episodes of pancreatitis. No peripancreatic inflammatory changes are identified on today's noncontrast CT examination. Spleen: Unremarkable. Adrenals/Urinary Tract: Bilateral adrenal glands and the right kidney are normal in appearance on today's noncontrast CT examination. Mild atrophy of the left kidney is unchanged. No hydroureteronephrosis. Unenhanced appearance of the urinary bladder is normal. Stomach/Bowel: Unenhanced appearance of the stomach is normal. No pathologic dilatation of small bowel or colon. Vascular/Lymphatic: Atherosclerotic calcifications throughout the abdominal and pelvic vasculature, without evidence of aneurysm. No definite lymphadenopathy noted in the abdomen or pelvis on today's noncontrast CT examination. Reproductive: Prostate gland and seminal vesicles are unremarkable in appearance. Other: No significant volume of ascites.  No pneumoperitoneum. Musculoskeletal: Widespread sclerotic lesions are again noted throughout the visualized axial and appendicular skeleton, very similar to the prior study, compatible with widespread metastatic disease to the bones. Severe joint space narrowing, subchondral sclerosis, subchondral cyst formation and osteophyte formation is again noted in both hip joints, compatible with advanced osteoarthritis. In addition, there is collapse of the weight-bearing surfaces of the femoral heads bilaterally, suggestive of avascular necrosis (unchanged). IMPRESSION: 1. Today's examination demonstrates stable to slightly improved mediastinal and right hilar lymphadenopathy, as discussed above. Additionally, all previously noted tiny pulmonary nodules are  unchanged. Small bilateral pleural effusions (left greater than right) are also  unchanged. 2. Widespread metastatic disease to the bones appear similar to the prior examination. 3. No definite new metastatic disease noted in the chest, abdomen or pelvis on today's examination. 4. Additional incidental findings, similar to the prior study, as above. Electronically Signed   By: Daniel  Entrikin M.D.   On: 08/03/2015 07:54    ASSESSMENT AND PLAN: This is a very pleasant 54 years old African-American male with: 1)  metastatic non-small cell lung cancer, adenocarcinoma status post induction chemotherapy with carboplatin and Alimta with initial good response followed by disease progression. The patient is currently on second line treatment with immunotherapy with Nivolumab status post 10 cycles. He tolerated the last cycle of his treatment fairly well with no concerning adverse effects. We will proceed with cycle #11 today as a scheduled. 2) recently diagnosed prostate adenocarcinoma: He is followed at [facility] for this problem. 3) pain management, he would continue on Percocet as needed. He was given a refill of this medication today. 4) metastatic bone disease, I discussed with the patient consideration of treatment with Xgeva after receive dental clearance. He is still reluctant about new treatment. The patient would come back for follow-up visit in 2 weeks for reevaluation before starting cycle #12. He was advised to call immediately if he has any concerning symptoms in the interval. The patient voices understanding of current disease status and treatment options and is in agreement with the current care plan.  All questions were answered. The patient knows to call the clinic with any problems, questions or concerns. We can certainly see the patient much sooner if necessary.  Disclaimer: This note was dictated with voice recognition software. Similar sounding words can inadvertently be transcribed and may not be corrected upon review.       

## 2015-09-14 ENCOUNTER — Ambulatory Visit (HOSPITAL_BASED_OUTPATIENT_CLINIC_OR_DEPARTMENT_OTHER): Payer: Medicare Other

## 2015-09-14 ENCOUNTER — Other Ambulatory Visit: Payer: Self-pay | Admitting: Medical Oncology

## 2015-09-14 ENCOUNTER — Telehealth: Payer: Self-pay | Admitting: Internal Medicine

## 2015-09-14 ENCOUNTER — Encounter: Payer: Self-pay | Admitting: Internal Medicine

## 2015-09-14 ENCOUNTER — Other Ambulatory Visit (HOSPITAL_BASED_OUTPATIENT_CLINIC_OR_DEPARTMENT_OTHER): Payer: Medicare Other

## 2015-09-14 ENCOUNTER — Ambulatory Visit (HOSPITAL_BASED_OUTPATIENT_CLINIC_OR_DEPARTMENT_OTHER): Payer: Medicare Other | Admitting: Internal Medicine

## 2015-09-14 VITALS — BP 103/73 | HR 98 | Temp 98.1°F | Resp 18 | Ht 69.0 in | Wt 133.6 lb

## 2015-09-14 DIAGNOSIS — G893 Neoplasm related pain (acute) (chronic): Secondary | ICD-10-CM

## 2015-09-14 DIAGNOSIS — C3491 Malignant neoplasm of unspecified part of right bronchus or lung: Secondary | ICD-10-CM

## 2015-09-14 DIAGNOSIS — C787 Secondary malignant neoplasm of liver and intrahepatic bile duct: Secondary | ICD-10-CM | POA: Diagnosis not present

## 2015-09-14 DIAGNOSIS — C61 Malignant neoplasm of prostate: Secondary | ICD-10-CM

## 2015-09-14 DIAGNOSIS — C3401 Malignant neoplasm of right main bronchus: Secondary | ICD-10-CM

## 2015-09-14 DIAGNOSIS — C7889 Secondary malignant neoplasm of other digestive organs: Secondary | ICD-10-CM | POA: Diagnosis not present

## 2015-09-14 DIAGNOSIS — E291 Testicular hypofunction: Secondary | ICD-10-CM | POA: Diagnosis not present

## 2015-09-14 DIAGNOSIS — Z5112 Encounter for antineoplastic immunotherapy: Secondary | ICD-10-CM | POA: Diagnosis not present

## 2015-09-14 DIAGNOSIS — C7951 Secondary malignant neoplasm of bone: Secondary | ICD-10-CM

## 2015-09-14 DIAGNOSIS — C801 Malignant (primary) neoplasm, unspecified: Principal | ICD-10-CM

## 2015-09-14 LAB — CBC WITH DIFFERENTIAL/PLATELET
BASO%: 0.4 % (ref 0.0–2.0)
Basophils Absolute: 0 10*3/uL (ref 0.0–0.1)
EOS ABS: 0.4 10*3/uL (ref 0.0–0.5)
EOS%: 3.8 % (ref 0.0–7.0)
HCT: 34 % — ABNORMAL LOW (ref 38.4–49.9)
HEMOGLOBIN: 10.9 g/dL — AB (ref 13.0–17.1)
LYMPH%: 20.4 % (ref 14.0–49.0)
MCH: 27 pg — AB (ref 27.2–33.4)
MCHC: 32.1 g/dL (ref 32.0–36.0)
MCV: 84.2 fL (ref 79.3–98.0)
MONO#: 0.5 10*3/uL (ref 0.1–0.9)
MONO%: 5.2 % (ref 0.0–14.0)
NEUT%: 70.2 % (ref 39.0–75.0)
NEUTROS ABS: 6.5 10*3/uL (ref 1.5–6.5)
Platelets: 319 10*3/uL (ref 140–400)
RBC: 4.04 10*6/uL — AB (ref 4.20–5.82)
RDW: 16.7 % — AB (ref 11.0–14.6)
WBC: 9.2 10*3/uL (ref 4.0–10.3)
lymph#: 1.9 10*3/uL (ref 0.9–3.3)

## 2015-09-14 LAB — COMPREHENSIVE METABOLIC PANEL
ALT: 17 U/L (ref 0–55)
AST: 19 U/L (ref 5–34)
Albumin: 3.1 g/dL — ABNORMAL LOW (ref 3.5–5.0)
Alkaline Phosphatase: 386 U/L — ABNORMAL HIGH (ref 40–150)
Anion Gap: 7 mEq/L (ref 3–11)
BUN: 39.9 mg/dL — AB (ref 7.0–26.0)
CO2: 21 meq/L — AB (ref 22–29)
Calcium: 9 mg/dL (ref 8.4–10.4)
Chloride: 109 mEq/L (ref 98–109)
Creatinine: 3.1 mg/dL (ref 0.7–1.3)
EGFR: 25 mL/min/{1.73_m2} — AB (ref >90)
GLUCOSE: 97 mg/dL (ref 70–140)
POTASSIUM: 5.4 meq/L — AB (ref 3.5–5.1)
SODIUM: 136 meq/L (ref 136–145)
TOTAL PROTEIN: 8.4 g/dL — AB (ref 6.4–8.3)
Total Bilirubin: <0.3 mg/dL (ref 0.20–1.20)

## 2015-09-14 MED ORDER — NIVOLUMAB CHEMO INJECTION 100 MG/10ML
240.0000 mg | Freq: Once | INTRAVENOUS | Status: AC
  Administered 2015-09-14: 240 mg via INTRAVENOUS
  Filled 2015-09-14: qty 4

## 2015-09-14 MED ORDER — OXYCODONE-ACETAMINOPHEN 5-325 MG PO TABS: 1.0000 | ORAL_TABLET | ORAL | Status: DC | PRN

## 2015-09-14 MED ORDER — HEPARIN SOD (PORK) LOCK FLUSH 100 UNIT/ML IV SOLN
500.0000 [IU] | Freq: Once | INTRAVENOUS | Status: AC | PRN
  Administered 2015-09-14: 500 [IU]
  Filled 2015-09-14: qty 5

## 2015-09-14 MED ORDER — SODIUM CHLORIDE 0.9 % IJ SOLN
10.0000 mL | INTRAMUSCULAR | Status: DC | PRN
  Administered 2015-09-14: 10 mL
  Filled 2015-09-14: qty 10

## 2015-09-14 MED ORDER — SODIUM CHLORIDE 0.9 % IV SOLN
Freq: Once | INTRAVENOUS | Status: AC
  Administered 2015-09-14: 10:00:00 via INTRAVENOUS

## 2015-09-14 NOTE — Progress Notes (Signed)
Ok to treat per Dr. Julien Nordmann with creatinine of 3.1.

## 2015-09-14 NOTE — Telephone Encounter (Signed)
per pt to sch pt appt-gave pt copy of avs-adv central sch will call to sch scan-gave contrast

## 2015-09-14 NOTE — Progress Notes (Signed)
[place] [phone]   [phone]  OFFICE PROGRESS NOTE  [name], [name], MD [place] [address]  DIAGNOSIS:  1) Stage IV (T2b, N2, M1b) non-small cell lung cancer, adenocarcinoma with negative EGFR mutation and negative gene translocation presented with a large right hilar mass as well as mediastinal lymphadenopathy and metastatic disease to the liver, bone and pancreas diagnosed in February 2016. 2) prostate adenocarcinoma diagnosed at [facility] with Gleason score 9 (4+5).  PRIOR THERAPY: Systemic chemotherapy with carboplatin for AUC of 5 and Alimta 500 MG/M2 every 3 weeks. Status post 6 cycles.  CURRENT THERAPY: Immunotherapy with Nivolumab 3 MG/KG every 2 weeks, status post 11 cycles.  INTERVAL HISTORY: Francisco Love 55 y.o. male returns to the clinic today for follow-up visit accompanied by his brother-in-law. The patient is tolerating his current treatment with immunotherapy with Nivolumab fairly well. He denied having any significant nausea or vomiting. He has no diarrhea. The patient denied having any fever or chills, no significant weight loss or night sweats. He has no chest pain, shortness of breath but has mild cough with no hemoptysis. He was started on hormonal treatment for his prostate cancer at [facility]. The patient is here today for evaluation before starting cycle #12. He is requesting refill of his pain medications.  MEDICAL HISTORY: Past Medical History  Diagnosis Date  . Shortness of breath dyspnea     ALLERGIES:  has No Known Allergies.  MEDICATIONS:  Current Outpatient Prescriptions  Medication Sig Dispense Refill  . acetaminophen (TYLENOL) 325 MG tablet Take 2 tablets (650 mg total) by mouth every 6 (six) hours as needed for mild pain (or Fever >/= 101).    Marland Kitchen albuterol (PROVENTIL) (2.5 MG/3ML) 0.083% nebulizer solution Take 3 mLs (2.5 mg total) by nebulization every 2 (two)  hours as needed for wheezing. 75 mL 12  . AMITIZA 24 MCG capsule     . bicalutamide (CASODEX) 50 MG tablet Take 50 mg by mouth.    . clobetasol ointment (TEMOVATE) 0.05 % Apply topically.    . diphenhydrAMINE (BENADRYL) 25 MG tablet Take 25 mg by mouth.    . diphenhydrAMINE (SOMINEX) 25 MG tablet Take 25 mg by mouth as needed for itching or sleep.    Marland Kitchen ENSURE PLUS (ENSURE PLUS) LIQD Take by mouth.    . feeding supplement, ENSURE COMPLETE, (ENSURE COMPLETE) LIQD Take 237 mLs by mouth 3 (three) times daily between meals.    Marland Kitchen levofloxacin (LEVAQUIN) 500 MG tablet     . lidocaine-prilocaine (EMLA) cream Apply 1 application topically as needed. Apply small amount  to port site 1.5 hours prior to chemotherapy. Do not rub in cream . Cover with cling wrap 30 g 0  . mirtazapine (REMERON) 15 MG tablet Take 1 tablet (15 mg total) by mouth at bedtime.    Marland Kitchen oxyCODONE-acetaminophen (PERCOCET/ROXICET) 5-325 MG tablet Take 1 tablet by mouth every 4 (four) hours as needed for severe pain. 60 tablet 0  . polyethylene glycol (MIRALAX / GLYCOLAX) packet Take 17 g by mouth daily.    . prochlorperazine (COMPAZINE) 10 MG tablet Take 1 tablet (10 mg total) by mouth every 6 (six) hours as needed for nausea or vomiting. 30 tablet 1  . senna-docusate (SENOKOT-S) 8.6-50 MG per tablet Take 1 tablet by mouth daily.    . tamsulosin (FLOMAX) 0.4 MG CAPS capsule      No current facility-administered medications for  this visit.    SURGICAL HISTORY:  Past Surgical History  Procedure Laterality Date  . Appendectomy    . Video bronchoscopy N/A 09/21/2014    Procedure: VIDEO BRONCHOSCOPY WITH FLUORO;  Surgeon: Rigoberto Noel, MD;  Location: [place];  Service: Cardiopulmonary;  Laterality: N/A;  . Video bronchoscopy with endobronchial ultrasound N/A 09/23/2014    Procedure: VIDEO BRONCHOSCOPY WITH ENDOBRONCHIAL ULTRASOUND;  Surgeon: Rigoberto Noel, MD;  Location: [place];  Service: Pulmonary;  Laterality: N/A;    REVIEW OF  SYSTEMS:  A comprehensive review of systems was negative except for: Constitutional: positive for fatigue Musculoskeletal: positive for back pain   PHYSICAL EXAMINATION: General appearance: alert, cooperative, fatigued and no distress Head: Normocephalic, without obvious abnormality, atraumatic Neck: no adenopathy, no JVD, supple, symmetrical, trachea midline and thyroid not enlarged, symmetric, no tenderness/mass/nodules Lymph nodes: Cervical, supraclavicular, and axillary nodes normal. Resp: clear to auscultation bilaterally Back: symmetric, no curvature. ROM normal. No CVA tenderness. Cardio: regular rate and rhythm, S1, S2 normal, no murmur, click, rub or gallop GI: soft, non-tender; bowel sounds normal; no masses,  no organomegaly Extremities: extremities normal, atraumatic, no cyanosis or edema Neurologic: Alert and oriented X 3, normal strength and tone. Normal symmetric reflexes. Normal coordination and gait  ECOG PERFORMANCE STATUS: 2 - Symptomatic, <50% confined to bed  Blood pressure 103/73, pulse 98, temperature 98.1 F (36.7 C), temperature source Oral, resp. rate 18, height 5' 9"  (1.753 m), weight 133 lb 9.6 oz (60.601 kg), SpO2 97 %.  LABORATORY DATA: Lab Results  Component Value Date   WBC 9.2 09/14/2015   HGB 10.9* 09/14/2015   HCT 34.0* 09/14/2015   MCV 84.2 09/14/2015   PLT 319 09/14/2015      Chemistry      Component Value Date/Time   NA 134* 09/01/2015 0813   NA 135 10/25/2014 0845   K 4.8 09/01/2015 0813   K 5.3* 10/25/2014 0845   CL 108 10/25/2014 0845   CO2 16* 09/01/2015 0813   CO2 19 10/25/2014 0845   BUN 51.1* 09/01/2015 0813   BUN 22 10/25/2014 0845   CREATININE 2.5* 09/01/2015 0813   CREATININE 1.03 10/25/2014 0845      Component Value Date/Time   CALCIUM 8.4 09/01/2015 0813   CALCIUM 8.6 10/25/2014 0845   ALKPHOS 285* 09/01/2015 0813   ALKPHOS 882* 09/20/2014 1120   AST 15 09/01/2015 0813   AST 29 09/20/2014 1120   ALT 15 09/01/2015  0813   ALT 34 09/20/2014 1120   BILITOT <0.30 09/01/2015 0813   BILITOT 0.1* 09/20/2014 1120       RADIOGRAPHIC STUDIES: No results found.  ASSESSMENT AND PLAN: This is a very pleasant 55 years old African-American male with: 1)  metastatic non-small cell lung cancer, adenocarcinoma status post induction chemotherapy with carboplatin and Alimta with initial good response followed by disease progression. The patient is currently on second line treatment with immunotherapy with Nivolumab status post 11 cycles. He tolerated the last cycle of his treatment fairly well with no concerning adverse effects. We will proceed with cycle #12 today as a scheduled. I will see him back for follow-up visit in 2 weeks for reevaluation after repeating CT scan of the chest, abdomen and pelvis for restaging of his disease. 2) recently diagnosed prostate adenocarcinoma: He is followed at [facility] for this problem. He is currently on hormonal therapy. 3) pain management, he would continue on Percocet as needed. He was given a refill of this medication  today. 4) metastatic bone disease, I discussed with the patient consideration of treatment with Xgeva after receive dental clearance. He is still reluctant about new treatment. The patient would come back for follow-up visit in 2 weeks for reevaluation before starting cycle #13. He was advised to call immediately if he has any concerning symptoms in the interval. The patient voices understanding of current disease status and treatment options and is in agreement with the current care plan.  All questions were answered. The patient knows to call the clinic with any problems, questions or concerns. We can certainly see the patient much sooner if necessary.  Disclaimer: This note was dictated with voice recognition software. Similar sounding words can inadvertently be transcribed and may not be corrected upon review.

## 2015-09-14 NOTE — Patient Instructions (Signed)
[facility] Discharge Instructions for Patients Receiving Chemotherapy  Today you received the following chemotherapy agents:  Nivolumab.  To help prevent nausea and vomiting after your treatment, we encourage you to take your nausea medication as directed.   If you develop nausea and vomiting that is not controlled by your nausea medication, call the clinic.   BELOW ARE SYMPTOMS THAT SHOULD BE REPORTED IMMEDIATELY:  *FEVER GREATER THAN 100.5 F  *CHILLS WITH OR WITHOUT FEVER  NAUSEA AND VOMITING THAT IS NOT CONTROLLED WITH YOUR NAUSEA MEDICATION  *UNUSUAL SHORTNESS OF BREATH  *UNUSUAL BRUISING OR BLEEDING  TENDERNESS IN MOUTH AND THROAT WITH OR WITHOUT PRESENCE OF ULCERS  *URINARY PROBLEMS  *BOWEL PROBLEMS  UNUSUAL RASH Items with * indicate a potential emergency and should be followed up as soon as possible.  Feel free to call the clinic you have any questions or concerns. The clinic phone number is [phone].  Please show the CHEMO ALERT CARD at check-in to the Emergency Department and triage nurse.   

## 2015-09-28 ENCOUNTER — Other Ambulatory Visit (HOSPITAL_BASED_OUTPATIENT_CLINIC_OR_DEPARTMENT_OTHER): Payer: Medicare Other

## 2015-09-28 ENCOUNTER — Ambulatory Visit (HOSPITAL_COMMUNITY)
Admission: RE | Admit: 2015-09-28 | Discharge: 2015-09-28 | Disposition: A | Discharge status: Home / Self Care | Payer: Medicare Other | Source: Ambulatory Visit | Attending: Internal Medicine | Admitting: Internal Medicine

## 2015-09-28 ENCOUNTER — Telehealth: Payer: Self-pay | Admitting: *Deleted

## 2015-09-28 ENCOUNTER — Telehealth: Payer: Self-pay | Admitting: Internal Medicine

## 2015-09-28 ENCOUNTER — Encounter (HOSPITAL_COMMUNITY): Payer: Self-pay

## 2015-09-28 ENCOUNTER — Encounter: Payer: Self-pay | Admitting: Internal Medicine

## 2015-09-28 ENCOUNTER — Ambulatory Visit (HOSPITAL_BASED_OUTPATIENT_CLINIC_OR_DEPARTMENT_OTHER): Payer: Medicare Other | Admitting: Internal Medicine

## 2015-09-28 ENCOUNTER — Ambulatory Visit (HOSPITAL_BASED_OUTPATIENT_CLINIC_OR_DEPARTMENT_OTHER): Payer: Medicare Other

## 2015-09-28 VITALS — BP 98/64 | HR 96 | Temp 98.2°F | Resp 22 | Ht 69.0 in | Wt 137.4 lb

## 2015-09-28 DIAGNOSIS — C7889 Secondary malignant neoplasm of other digestive organs: Secondary | ICD-10-CM

## 2015-09-28 DIAGNOSIS — G893 Neoplasm related pain (acute) (chronic): Secondary | ICD-10-CM

## 2015-09-28 DIAGNOSIS — R590 Localized enlarged lymph nodes: Secondary | ICD-10-CM | POA: Insufficient documentation

## 2015-09-28 DIAGNOSIS — C801 Malignant (primary) neoplasm, unspecified: Secondary | ICD-10-CM | POA: Diagnosis present

## 2015-09-28 DIAGNOSIS — C3401 Malignant neoplasm of right main bronchus: Secondary | ICD-10-CM

## 2015-09-28 DIAGNOSIS — C3491 Malignant neoplasm of unspecified part of right bronchus or lung: Secondary | ICD-10-CM

## 2015-09-28 DIAGNOSIS — C787 Secondary malignant neoplasm of liver and intrahepatic bile duct: Secondary | ICD-10-CM | POA: Diagnosis not present

## 2015-09-28 DIAGNOSIS — I2510 Atherosclerotic heart disease of native coronary artery without angina pectoris: Secondary | ICD-10-CM | POA: Insufficient documentation

## 2015-09-28 DIAGNOSIS — Z5112 Encounter for antineoplastic immunotherapy: Secondary | ICD-10-CM

## 2015-09-28 DIAGNOSIS — C61 Malignant neoplasm of prostate: Secondary | ICD-10-CM | POA: Diagnosis not present

## 2015-09-28 DIAGNOSIS — Z9221 Personal history of antineoplastic chemotherapy: Secondary | ICD-10-CM | POA: Diagnosis not present

## 2015-09-28 DIAGNOSIS — C7951 Secondary malignant neoplasm of bone: Secondary | ICD-10-CM

## 2015-09-28 LAB — COMPREHENSIVE METABOLIC PANEL
ALT: 54 U/L (ref 0–55)
ANION GAP: 12 meq/L — AB (ref 3–11)
AST: 48 U/L — AB (ref 5–34)
Albumin: 3.1 g/dL — ABNORMAL LOW (ref 3.5–5.0)
Alkaline Phosphatase: 536 U/L — ABNORMAL HIGH (ref 40–150)
BUN: 47.5 mg/dL — ABNORMAL HIGH (ref 7.0–26.0)
CHLORIDE: 111 meq/L — AB (ref 98–109)
CO2: 14 meq/L — AB (ref 22–29)
CREATININE: 2.7 mg/dL — AB (ref 0.7–1.3)
Calcium: 8.8 mg/dL (ref 8.4–10.4)
EGFR: 29 mL/min/{1.73_m2} — ABNORMAL LOW (ref >90)
Glucose: 90 mg/dl (ref 70–140)
Potassium: 5.3 mEq/L — ABNORMAL HIGH (ref 3.5–5.1)
Sodium: 137 mEq/L (ref 136–145)
Total Bilirubin: <0.3 mg/dL (ref 0.20–1.20)
Total Protein: 8.5 g/dL — ABNORMAL HIGH (ref 6.4–8.3)

## 2015-09-28 LAB — CBC WITH DIFFERENTIAL/PLATELET
BASO%: 0.5 % (ref 0.0–2.0)
Basophils Absolute: 0.1 10*3/uL (ref 0.0–0.1)
EOS%: 3.7 % (ref 0.0–7.0)
Eosinophils Absolute: 0.4 10*3/uL (ref 0.0–0.5)
HCT: 31.4 % — ABNORMAL LOW (ref 38.4–49.9)
HGB: 10.3 g/dL — ABNORMAL LOW (ref 13.0–17.1)
LYMPH%: 30.8 % (ref 14.0–49.0)
MCH: 27.5 pg (ref 27.2–33.4)
MCHC: 32.8 g/dL (ref 32.0–36.0)
MCV: 84 fL (ref 79.3–98.0)
MONO#: 0.6 10*3/uL (ref 0.1–0.9)
MONO%: 6.7 % (ref 0.0–14.0)
NEUT#: 5.6 10*3/uL (ref 1.5–6.5)
NEUT%: 58.3 % (ref 39.0–75.0)
PLATELETS: 356 10*3/uL (ref 140–400)
RBC: 3.74 10*6/uL — AB (ref 4.20–5.82)
RDW: 17.9 % — ABNORMAL HIGH (ref 11.0–14.6)
WBC: 9.6 10*3/uL (ref 4.0–10.3)
lymph#: 2.9 10*3/uL (ref 0.9–3.3)

## 2015-09-28 LAB — TECHNOLOGIST REVIEW

## 2015-09-28 MED ORDER — SODIUM CHLORIDE 0.9 % IJ SOLN
10.0000 mL | INTRAMUSCULAR | Status: DC | PRN
  Administered 2015-09-28: 10 mL
  Filled 2015-09-28: qty 10

## 2015-09-28 MED ORDER — OXYCODONE-ACETAMINOPHEN 5-325 MG PO TABS: 1.0000 | ORAL_TABLET | ORAL | Status: DC | PRN

## 2015-09-28 MED ORDER — HEPARIN SOD (PORK) LOCK FLUSH 100 UNIT/ML IV SOLN
500.0000 [IU] | Freq: Once | INTRAVENOUS | Status: AC | PRN
  Administered 2015-09-28: 500 [IU]
  Filled 2015-09-28: qty 5

## 2015-09-28 MED ORDER — SODIUM CHLORIDE 0.9 % IV SOLN
Freq: Once | INTRAVENOUS | Status: AC
  Administered 2015-09-28: 11:00:00 via INTRAVENOUS

## 2015-09-28 MED ORDER — SODIUM CHLORIDE 0.9 % IV SOLN
240.0000 mg | Freq: Once | INTRAVENOUS | Status: AC
  Administered 2015-09-28: 240 mg via INTRAVENOUS
  Filled 2015-09-28: qty 8

## 2015-09-28 NOTE — Progress Notes (Signed)
CMET reviewed with Dr. Julien Nordmann, [place] to treat with creatinine 2.7. Jan, Infusion RN made aware.

## 2015-09-28 NOTE — Telephone Encounter (Signed)
Per staff message and POF I have scheduled appts. Advised scheduler of appts. JMW  

## 2015-09-28 NOTE — Progress Notes (Signed)
[place] Telephone:([phone]) [phone]   Fax:([phone]  OFFICE PROGRESS NOTE  [name], [name], MD [employer] [facility] [place] [address]  DIAGNOSIS:  1) Stage IV (T2b, N2, M1b) non-small cell lung cancer, adenocarcinoma with negative EGFR mutation and negative gene translocation presented with a large right hilar mass as well as mediastinal lymphadenopathy and metastatic disease to the liver, bone and pancreas diagnosed in February 2016. 2) prostate adenocarcinoma diagnosed at [facility] with Gleason score 9 (4+5).  PRIOR THERAPY: Systemic chemotherapy with carboplatin for AUC of 5 and Alimta 500 MG/M2 every 3 weeks. Status post 6 cycles.  CURRENT THERAPY:  1) Immunotherapy with Nivolumab 240 MG every 2 weeks, status post 12 cycles. 2) Lupron and Casodex for the recently diagnosed prostate adenocarcinoma at [place]: Francisco Love 55 y.o. male returns to the clinic today for follow-up visit accompanied by his brother-in-law. The patient is tolerating his current treatment with immunotherapy with Nivolumab fairly well. He is more mobile these days and able to walk at the mall few times last week. He denied having any significant nausea or vomiting. He has no diarrhea. The patient denied having any fever or chills, no significant weight loss or night sweats. He has no chest pain, shortness of breath but has mild cough with no hemoptysis. He had repeat CT scan of the chest, abdomen and pelvis performed earlier today and he is here for evaluation and discussion of his scan results before starting cycle #13 of his immunotherapy.  MEDICAL HISTORY: Past Medical History  Diagnosis Date  . Shortness of breath dyspnea     ALLERGIES:  has No Known Allergies.  MEDICATIONS:  Current Outpatient Prescriptions  Medication Sig Dispense Refill  . acetaminophen (TYLENOL) 325 MG tablet Take 2 tablets (650 mg total) by mouth every 6 (six) hours  as needed for mild pain (or Fever >/= 101).    Marland Kitchen albuterol (PROVENTIL) (2.5 MG/3ML) 0.083% nebulizer solution Take 3 mLs (2.5 mg total) by nebulization every 2 (two) hours as needed for wheezing. 75 mL 12  . AMITIZA 24 MCG capsule     . bicalutamide (CASODEX) 50 MG tablet Take 50 mg by mouth.    . clobetasol ointment (TEMOVATE) 0.05 % Apply topically.    . diphenhydrAMINE (BENADRYL) 25 MG tablet Take 25 mg by mouth.    . diphenhydrAMINE (SOMINEX) 25 MG tablet Take 25 mg by mouth as needed for itching or sleep.    Marland Kitchen doxycycline (ADOXA) 100 MG tablet     . ENSURE PLUS (ENSURE PLUS) LIQD Take by mouth.    . feeding supplement, ENSURE COMPLETE, (ENSURE COMPLETE) LIQD Take 237 mLs by mouth 3 (three) times daily between meals.    Marland Kitchen levofloxacin (LEVAQUIN) 500 MG tablet     . lidocaine-prilocaine (EMLA) cream Apply 1 application topically as needed. Apply small amount  to port site 1.5 hours prior to chemotherapy. Do not rub in cream . Cover with cling wrap 30 g 0  . mirtazapine (REMERON) 15 MG tablet Take 1 tablet (15 mg total) by mouth at bedtime.    Marland Kitchen oxyCODONE-acetaminophen (PERCOCET/ROXICET) 5-325 MG tablet Take 1 tablet by mouth every 4 (four) hours as needed for severe pain. 60 tablet 0  . polyethylene glycol (MIRALAX / GLYCOLAX) packet Take 17 g by mouth daily.    . prochlorperazine (COMPAZINE) 10 MG tablet Take 1 tablet (10 mg total) by mouth every 6 (six) hours  as needed for nausea or vomiting. 30 tablet 1  . senna-docusate (SENOKOT-S) 8.6-50 MG per tablet Take 1 tablet by mouth daily.    . tamsulosin (FLOMAX) 0.4 MG CAPS capsule     . valACYclovir (VALTREX) 1000 MG tablet      No current facility-administered medications for this visit.    SURGICAL HISTORY:  Past Surgical History  Procedure Laterality Date  . Appendectomy    . Video bronchoscopy N/A 09/21/2014    Procedure: VIDEO BRONCHOSCOPY WITH FLUORO;  Surgeon: Rigoberto Noel, MD;  Location: [place];  Service: Cardiopulmonary;   Laterality: N/A;  . Video bronchoscopy with endobronchial ultrasound N/A 09/23/2014    Procedure: VIDEO BRONCHOSCOPY WITH ENDOBRONCHIAL ULTRASOUND;  Surgeon: Rigoberto Noel, MD;  Location: [place];  Service: Pulmonary;  Laterality: N/A;    REVIEW OF SYSTEMS:  Constitutional: negative Eyes: negative Ears, nose, mouth, throat, and face: negative Respiratory: negative Cardiovascular: negative Gastrointestinal: negative Genitourinary:negative Integument/breast: negative Hematologic/lymphatic: negative Musculoskeletal:negative Neurological: positive for weakness Behavioral/Psych: negative Endocrine: negative Allergic/Immunologic: negative   PHYSICAL EXAMINATION: General appearance: alert, cooperative, fatigued and no distress Head: Normocephalic, without obvious abnormality, atraumatic Neck: no adenopathy, no JVD, supple, symmetrical, trachea midline and thyroid not enlarged, symmetric, no tenderness/mass/nodules Lymph nodes: Cervical, supraclavicular, and axillary nodes normal. Resp: clear to auscultation bilaterally Back: symmetric, no curvature. ROM normal. No CVA tenderness. Cardio: regular rate and rhythm, S1, S2 normal, no murmur, click, rub or gallop GI: soft, non-tender; bowel sounds normal; no masses,  no organomegaly Extremities: extremities normal, atraumatic, no cyanosis or edema Neurologic: Alert and oriented X 3, normal strength and tone. Normal symmetric reflexes. Normal coordination and gait  ECOG PERFORMANCE STATUS: 2 - Symptomatic, <50% confined to bed  Blood pressure 98/64, pulse 96, temperature 98.2 F (36.8 C), temperature source Oral, resp. rate 22, height 5' 9"  (1.753 m), weight 137 lb 6.4 oz (62.324 kg), SpO2 100 %.  LABORATORY DATA: Lab Results  Component Value Date   WBC 9.6 09/28/2015   HGB 10.3* 09/28/2015   HCT 31.4* 09/28/2015   MCV 84.0 09/28/2015   PLT 356 09/28/2015      Chemistry      Component Value Date/Time   NA 136 09/14/2015 0821   NA 135  10/25/2014 0845   K 5.4* 09/14/2015 0821   K 5.3* 10/25/2014 0845   CL 108 10/25/2014 0845   CO2 21* 09/14/2015 0821   CO2 19 10/25/2014 0845   BUN 39.9* 09/14/2015 0821   BUN 22 10/25/2014 0845   CREATININE 3.1* 09/14/2015 0821   CREATININE 1.03 10/25/2014 0845      Component Value Date/Time   CALCIUM 9.0 09/14/2015 0821   CALCIUM 8.6 10/25/2014 0845   ALKPHOS 386* 09/14/2015 0821   ALKPHOS 882* 09/20/2014 1120   AST 19 09/14/2015 0821   AST 29 09/20/2014 1120   ALT 17 09/14/2015 0821   ALT 34 09/20/2014 1120   BILITOT <0.30 09/14/2015 0821   BILITOT 0.1* 09/20/2014 1120       RADIOGRAPHIC STUDIES: Ct Abdomen Pelvis Wo Contrast  09/28/2015  CLINICAL DATA:  Lung cancer diagnosed 2/16. History of prostate cancer. Chemotherapy in progress. No current complaints. Metastatic adenocarcinoma involving skeletal bone. EXAM: CT CHEST, ABDOMEN AND PELVIS WITHOUT CONTRAST TECHNIQUE: Multidetector CT imaging of the chest, abdomen and pelvis was performed following the standard protocol without IV contrast. COMPARISON:  08/03/2015 FINDINGS: CT CHEST Mediastinum/Nodes: A right Port-A-Cath which terminates at the high right atrium. Thoracic aortic atherosclerosis. Mild cardiomegaly. LAD coronary artery  atherosclerosis. Right paratracheal adenopathy measures 2.7 cm on image 18/series 2 versus 3.0 cm on the prior exam (when remeasured). Precarinal component measures 1.8 cm on image 24/series 2 today versus 2.4 cm on the prior exam (when remeasured). Node within the subcarinal station with extension into the azygoesophageal recess measures 12 mm today versus 14 mm on the prior. Partially calcified right hilar adenopathy is suboptimally evaluated but likely mildly decreased. Lungs/Pleura: Tiny left pleural effusion and trace right pleural fluid are similar. Moderate centrilobular emphysema. 4 mm right lower lobe pulmonary nodule on image 32 is similar (when remeasured). Partially calcified left upper lobe  pulmonary nodule measures 8 mm on image 25/series 4 and is similar (when remeasured). A 3 mm left apical nodule is similar on image 9/series 4. No enlarging pulmonary nodules. Musculoskeletal: Widespread sclerotic osseous metastasis. Index right-sided T11 lesion measures 2.0 cm today versus 1.7 cm on the prior. CT ABDOMEN AND PELVIS Hepatobiliary: Mild degradation secondary to patient arm position, not raised above the head. Left hepatic lobe cyst of less than a cm. Normal gallbladder, without biliary ductal dilatation. Pancreas: Pancreatic calcifications within the head are likely related to prior pancreatitis. No dominant mass or duct dilatation identified. Spleen: Normal in size, without focal abnormality. Adrenals/Urinary Tract: Normal adrenal glands. Mild left renal cortical thinning. At least partially duplicated right renal collecting system. Normal urinary bladder. Stomach/Bowel: Proximal gastric underdistention. Colonic stool burden suggests constipation. Normal small bowel. Vascular/Lymphatic: Aortic and branch vessel atherosclerosis. No abdominopelvic adenopathy. Reproductive: Mild prostatomegaly. Other: No significant free fluid. Musculoskeletal: Advanced osteoarthritis of both hips. Increased sclerosis involving the left ischium on image 118/series 2. Posterior left acetabular sclerosis is also increased, including on image 112/series 2. IMPRESSION: CT CHEST IMPRESSION 1. Response to therapy of thoracic adenopathy. 2. Similar small pulmonary nodules. 3. Tiny left greater than right pleural effusions are not significantly changed. 4. Age advanced coronary artery atherosclerosis. Recommend assessment of coronary risk factors and consideration of medical therapy. CT ABDOMEN AND PELVIS IMPRESSION 1. No extraosseous metastasis within the abdomen and pelvis. 2. Increased number and size of sclerotic lesions throughout the bones. Favor healing of osseous metastasis. Electronically Signed   By: Abigail Miyamoto  M.D.   On: 09/28/2015 09:34   Ct Chest Wo Contrast  09/28/2015  CLINICAL DATA:  Lung cancer diagnosed 2/16. History of prostate cancer. Chemotherapy in progress. No current complaints. Metastatic adenocarcinoma involving skeletal bone. EXAM: CT CHEST, ABDOMEN AND PELVIS WITHOUT CONTRAST TECHNIQUE: Multidetector CT imaging of the chest, abdomen and pelvis was performed following the standard protocol without IV contrast. COMPARISON:  08/03/2015 FINDINGS: CT CHEST Mediastinum/Nodes: A right Port-A-Cath which terminates at the high right atrium. Thoracic aortic atherosclerosis. Mild cardiomegaly. LAD coronary artery atherosclerosis. Right paratracheal adenopathy measures 2.7 cm on image 18/series 2 versus 3.0 cm on the prior exam (when remeasured). Precarinal component measures 1.8 cm on image 24/series 2 today versus 2.4 cm on the prior exam (when remeasured). Node within the subcarinal station with extension into the azygoesophageal recess measures 12 mm today versus 14 mm on the prior. Partially calcified right hilar adenopathy is suboptimally evaluated but likely mildly decreased. Lungs/Pleura: Tiny left pleural effusion and trace right pleural fluid are similar. Moderate centrilobular emphysema. 4 mm right lower lobe pulmonary nodule on image 32 is similar (when remeasured). Partially calcified left upper lobe pulmonary nodule measures 8 mm on image 25/series 4 and is similar (when remeasured). A 3 mm left apical nodule is similar on image 9/series 4. No enlarging pulmonary  nodules. Musculoskeletal: Widespread sclerotic osseous metastasis. Index right-sided T11 lesion measures 2.0 cm today versus 1.7 cm on the prior. CT ABDOMEN AND PELVIS Hepatobiliary: Mild degradation secondary to patient arm position, not raised above the head. Left hepatic lobe cyst of less than a cm. Normal gallbladder, without biliary ductal dilatation. Pancreas: Pancreatic calcifications within the head are likely related to prior  pancreatitis. No dominant mass or duct dilatation identified. Spleen: Normal in size, without focal abnormality. Adrenals/Urinary Tract: Normal adrenal glands. Mild left renal cortical thinning. At least partially duplicated right renal collecting system. Normal urinary bladder. Stomach/Bowel: Proximal gastric underdistention. Colonic stool burden suggests constipation. Normal small bowel. Vascular/Lymphatic: Aortic and branch vessel atherosclerosis. No abdominopelvic adenopathy. Reproductive: Mild prostatomegaly. Other: No significant free fluid. Musculoskeletal: Advanced osteoarthritis of both hips. Increased sclerosis involving the left ischium on image 118/series 2. Posterior left acetabular sclerosis is also increased, including on image 112/series 2. IMPRESSION: CT CHEST IMPRESSION 1. Response to therapy of thoracic adenopathy. 2. Similar small pulmonary nodules. 3. Tiny left greater than right pleural effusions are not significantly changed. 4. Age advanced coronary artery atherosclerosis. Recommend assessment of coronary risk factors and consideration of medical therapy. CT ABDOMEN AND PELVIS IMPRESSION 1. No extraosseous metastasis within the abdomen and pelvis. 2. Increased number and size of sclerotic lesions throughout the bones. Favor healing of osseous metastasis. Electronically Signed   By: Abigail Miyamoto M.D.   On: 09/28/2015 09:34   ASSESSMENT AND PLAN: This is a very pleasant 55 years old African-American male with: 1)  metastatic non-small cell lung cancer, adenocarcinoma status post induction chemotherapy with carboplatin and Alimta with initial good response followed by disease progression. The patient is currently on second line treatment with immunotherapy with Nivolumab status post 12 cycles. He tolerated the last cycle of his treatment fairly well with no concerning adverse effects.  The recent CT scan of the chest, abdomen and pelvis showed no evidence for disease progression. I discussed  the scan results with the patient and his family member. I recommended for him to continue his treatment with immunotherapy. The patient will start cycle #16 today. 2) recently diagnosed prostate adenocarcinoma: He is followed at [facility] for this problem. He is currently on hormonal therapy with Lupron and Casodex. 3) pain management, he would continue on Percocet as needed. He was given a refill of this medication today. 4) metastatic bone disease, I discussed with the patient consideration of treatment with Xgeva after receive dental clearance. He is still reluctant about new treatment. The patient would come back for follow-up visit in 2 weeks for reevaluation before starting cycle #14. He was advised to call immediately if he has any concerning symptoms in the interval. The patient voices understanding of current disease status and treatment options and is in agreement with the current care plan.  All questions were answered. The patient knows to call the clinic with any problems, questions or concerns. We can certainly see the patient much sooner if necessary.  Disclaimer: This note was dictated with voice recognition software. Similar sounding words can inadvertently be transcribed and may not be corrected upon review.

## 2015-09-28 NOTE — Patient Instructions (Signed)
[facility] Discharge Instructions for Patients Receiving Chemotherapy  Today you received the following chemotherapy agents:  Nivolumab.  To help prevent nausea and vomiting after your treatment, we encourage you to take your nausea medication as directed.   If you develop nausea and vomiting that is not controlled by your nausea medication, call the clinic.   BELOW ARE SYMPTOMS THAT SHOULD BE REPORTED IMMEDIATELY:  *FEVER GREATER THAN 100.5 F  *CHILLS WITH OR WITHOUT FEVER  NAUSEA AND VOMITING THAT IS NOT CONTROLLED WITH YOUR NAUSEA MEDICATION  *UNUSUAL SHORTNESS OF BREATH  *UNUSUAL BRUISING OR BLEEDING  TENDERNESS IN MOUTH AND THROAT WITH OR WITHOUT PRESENCE OF ULCERS  *URINARY PROBLEMS  *BOWEL PROBLEMS  UNUSUAL RASH Items with * indicate a potential emergency and should be followed up as soon as possible.  Feel free to call the clinic you have any questions or concerns. The clinic phone number is [phone].  Please show the CHEMO ALERT CARD at check-in to the Emergency Department and triage nurse.   

## 2015-09-28 NOTE — Telephone Encounter (Signed)
per pof to sch pt appt-gave pt copy of avs-Mw sch missed trmt or today and pt will get updated copy of avs b4 leaving trmt room

## 2015-10-10 ENCOUNTER — Inpatient Hospital Stay (HOSPITAL_BASED_OUTPATIENT_CLINIC_OR_DEPARTMENT_OTHER)
Admission: AD | Admit: 2015-10-10 | Payer: Self-pay | Source: Other Acute Inpatient Hospital | Admitting: Internal Medicine

## 2015-10-10 DIAGNOSIS — K5660 Unspecified intestinal obstruction: Secondary | ICD-10-CM

## 2015-10-10 NOTE — Progress Notes (Signed)
Accepted transfer from [facility] Francisco Love is a 55 year old male with past medical history significant for lung cancer oxygen dependent on 2 L; who presented to the outside facility with complaints of abdominal pain found to have an acute small bowel obstruction. General surgery was consulted and it was recommended for nasogastric tube placement which improved patient's abdominal pain complaints. Transfer was requested due to the fact that the patient's receiving chemotherapy from Dr. Earlie Server at [facility] and is getting for upcoming treatment.  Initial lab work showed potassium 5.2, bicarbonate 20.9, BUN 56, creatinine 2.1, WBC 4.1, hemoglobin 11.9. Patient's vital signs were otherwise stable prior to transport. Patient was accepted to a telemetry bed.

## 2015-10-11 ENCOUNTER — Other Ambulatory Visit: Payer: Self-pay | Admitting: Medical Oncology

## 2015-10-11 ENCOUNTER — Telehealth: Payer: Self-pay | Admitting: *Deleted

## 2015-10-11 ENCOUNTER — Telehealth: Payer: Self-pay | Admitting: Internal Medicine

## 2015-10-11 DIAGNOSIS — G893 Neoplasm related pain (acute) (chronic): Secondary | ICD-10-CM

## 2015-10-11 DIAGNOSIS — C7951 Secondary malignant neoplasm of bone: Secondary | ICD-10-CM

## 2015-10-11 DIAGNOSIS — C801 Malignant (primary) neoplasm, unspecified: Principal | ICD-10-CM

## 2015-10-11 MED ORDER — OXYCODONE-ACETAMINOPHEN 5-325 MG PO TABS: 1.0000 | ORAL_TABLET | ORAL | Status: DC | PRN

## 2015-10-11 NOTE — Telephone Encounter (Signed)
Per 02/21 POF, cancelled labs/ov/chemo, pt is in the hospital... KJ

## 2015-10-11 NOTE — Telephone Encounter (Signed)
VM message received gfrom pt's son stating that patient will not be able to come to appts set up for 10/12/15 as patient is in the hospital in .[place]. Pt scheduled for labs, Dr. Julien Nordmann and chemo on 10/12/15.  Son also asking for pain med refill.  Pain med is filled every 2 weeks. Last filled on 09/28/15 for 60 tabs. Son states that MD at [facility] will not address pain med issues. Darryl can pick up prescription tomorrow 10/12/15.  Darryl also wants to speak to Dr. Julien Nordmann about his father's condition.   [phone]  Please call Darryl when prescription is ready for pick up.   Patient to be discharged either today or tomorrow.

## 2015-10-11 NOTE — Progress Notes (Signed)
Darryl stated pt is in hospital. Last Sunday he started having abdominal pain and went to ED . His dx was  "blockage in bowel-he had a tube placed in his stomach and unblocked it. He was taking Miralx and stool softener every day or every other day and was having BM.  I told brother in alw to keep appt in march and I will cancel appts for tomorrow.Note to [place]. RX for percocet locked up and Darryl notified that it is ready for pick up tomorrow am.

## 2015-10-12 ENCOUNTER — Ambulatory Visit: Payer: Medicare Other

## 2015-10-12 ENCOUNTER — Ambulatory Visit: Payer: Medicare Other | Admitting: Internal Medicine

## 2015-10-12 ENCOUNTER — Other Ambulatory Visit: Payer: Medicare Other

## 2015-10-27 ENCOUNTER — Encounter: Payer: Self-pay | Admitting: Internal Medicine

## 2015-10-27 ENCOUNTER — Other Ambulatory Visit (HOSPITAL_BASED_OUTPATIENT_CLINIC_OR_DEPARTMENT_OTHER): Payer: Medicare Other

## 2015-10-27 ENCOUNTER — Ambulatory Visit (HOSPITAL_BASED_OUTPATIENT_CLINIC_OR_DEPARTMENT_OTHER): Payer: Medicare Other | Admitting: Internal Medicine

## 2015-10-27 ENCOUNTER — Telehealth: Payer: Self-pay | Admitting: Internal Medicine

## 2015-10-27 ENCOUNTER — Ambulatory Visit (HOSPITAL_BASED_OUTPATIENT_CLINIC_OR_DEPARTMENT_OTHER): Payer: Medicare Other

## 2015-10-27 VITALS — HR 88

## 2015-10-27 VITALS — BP 113/79 | HR 102 | Temp 97.6°F | Resp 20 | Wt 135.6 lb

## 2015-10-27 DIAGNOSIS — G893 Neoplasm related pain (acute) (chronic): Secondary | ICD-10-CM

## 2015-10-27 DIAGNOSIS — C3491 Malignant neoplasm of unspecified part of right bronchus or lung: Secondary | ICD-10-CM

## 2015-10-27 DIAGNOSIS — C7889 Secondary malignant neoplasm of other digestive organs: Secondary | ICD-10-CM

## 2015-10-27 DIAGNOSIS — C61 Malignant neoplasm of prostate: Secondary | ICD-10-CM

## 2015-10-27 DIAGNOSIS — C3401 Malignant neoplasm of right main bronchus: Secondary | ICD-10-CM

## 2015-10-27 DIAGNOSIS — C7951 Secondary malignant neoplasm of bone: Secondary | ICD-10-CM | POA: Diagnosis not present

## 2015-10-27 DIAGNOSIS — C787 Secondary malignant neoplasm of liver and intrahepatic bile duct: Secondary | ICD-10-CM

## 2015-10-27 DIAGNOSIS — C801 Malignant (primary) neoplasm, unspecified: Principal | ICD-10-CM

## 2015-10-27 DIAGNOSIS — Z5112 Encounter for antineoplastic immunotherapy: Secondary | ICD-10-CM

## 2015-10-27 LAB — CBC WITH DIFFERENTIAL/PLATELET
BASO%: 0.8 % (ref 0.0–2.0)
BASOS ABS: 0.1 10*3/uL (ref 0.0–0.1)
EOS%: 5.9 % (ref 0.0–7.0)
Eosinophils Absolute: 0.7 10*3/uL — ABNORMAL HIGH (ref 0.0–0.5)
HCT: 34.8 % — ABNORMAL LOW (ref 38.4–49.9)
HEMOGLOBIN: 11.1 g/dL — AB (ref 13.0–17.1)
LYMPH%: 22.2 % (ref 14.0–49.0)
MCH: 27.8 pg (ref 27.2–33.4)
MCHC: 31.9 g/dL — AB (ref 32.0–36.0)
MCV: 87 fL (ref 79.3–98.0)
MONO#: 0.9 10*3/uL (ref 0.1–0.9)
MONO%: 7.4 % (ref 0.0–14.0)
NEUT#: 7.7 10*3/uL — ABNORMAL HIGH (ref 1.5–6.5)
NEUT%: 63.7 % (ref 39.0–75.0)
Platelets: 306 10*3/uL (ref 140–400)
RBC: 4 10*6/uL — AB (ref 4.20–5.82)
RDW: 19.7 % — AB (ref 11.0–14.6)
WBC: 12.1 10*3/uL — ABNORMAL HIGH (ref 4.0–10.3)
lymph#: 2.7 10*3/uL (ref 0.9–3.3)

## 2015-10-27 LAB — COMPREHENSIVE METABOLIC PANEL
ALBUMIN: 3.3 g/dL — AB (ref 3.5–5.0)
ALT: 17 U/L (ref 0–55)
AST: 17 U/L (ref 5–34)
Alkaline Phosphatase: 225 U/L — ABNORMAL HIGH (ref 40–150)
Anion Gap: 10 mEq/L (ref 3–11)
BUN: 47.3 mg/dL — AB (ref 7.0–26.0)
CHLORIDE: 105 meq/L (ref 98–109)
CO2: 20 meq/L — AB (ref 22–29)
Calcium: 8.5 mg/dL (ref 8.4–10.4)
Creatinine: 2.7 mg/dL — ABNORMAL HIGH (ref 0.7–1.3)
EGFR: 29 mL/min/{1.73_m2} — ABNORMAL LOW (ref >90)
GLUCOSE: 89 mg/dL (ref 70–140)
POTASSIUM: 4.7 meq/L (ref 3.5–5.1)
SODIUM: 136 meq/L (ref 136–145)
Total Bilirubin: <0.3 mg/dL (ref 0.20–1.20)
Total Protein: 8.5 g/dL — ABNORMAL HIGH (ref 6.4–8.3)

## 2015-10-27 MED ORDER — SODIUM CHLORIDE 0.9 % IJ SOLN
10.0000 mL | INTRAMUSCULAR | Status: DC | PRN
  Administered 2015-10-27: 10 mL
  Filled 2015-10-27: qty 10

## 2015-10-27 MED ORDER — OXYCODONE-ACETAMINOPHEN 5-325 MG PO TABS: 1.0000 | ORAL_TABLET | ORAL | Status: DC | PRN

## 2015-10-27 MED ORDER — HEPARIN SOD (PORK) LOCK FLUSH 100 UNIT/ML IV SOLN
500.0000 [IU] | Freq: Once | INTRAVENOUS | Status: AC | PRN
  Administered 2015-10-27: 500 [IU]
  Filled 2015-10-27: qty 5

## 2015-10-27 MED ORDER — SODIUM CHLORIDE 0.9 % IV SOLN
Freq: Once | INTRAVENOUS | Status: AC
  Administered 2015-10-27: 09:00:00 via INTRAVENOUS

## 2015-10-27 MED ORDER — SODIUM CHLORIDE 0.9 % IV SOLN
240.0000 mg | Freq: Once | INTRAVENOUS | Status: AC
  Administered 2015-10-27: 240 mg via INTRAVENOUS
  Filled 2015-10-27: qty 8

## 2015-10-27 NOTE — Patient Instructions (Signed)
[facility] Discharge Instructions for Patients Receiving Chemotherapy  Today you received the following chemotherapy agents:  Nivolumab.  To help prevent nausea and vomiting after your treatment, we encourage you to take your nausea medication as directed.   If you develop nausea and vomiting that is not controlled by your nausea medication, call the clinic.   BELOW ARE SYMPTOMS THAT SHOULD BE REPORTED IMMEDIATELY:  *FEVER GREATER THAN 100.5 F  *CHILLS WITH OR WITHOUT FEVER  NAUSEA AND VOMITING THAT IS NOT CONTROLLED WITH YOUR NAUSEA MEDICATION  *UNUSUAL SHORTNESS OF BREATH  *UNUSUAL BRUISING OR BLEEDING  TENDERNESS IN MOUTH AND THROAT WITH OR WITHOUT PRESENCE OF ULCERS  *URINARY PROBLEMS  *BOWEL PROBLEMS  UNUSUAL RASH Items with * indicate a potential emergency and should be followed up as soon as possible.  Feel free to call the clinic you have any questions or concerns. The clinic phone number is [phone].  Please show the CHEMO ALERT CARD at check-in to the Emergency Department and triage nurse.   

## 2015-10-27 NOTE — Addendum Note (Signed)
Addended by: Lucile Crater on: 10/27/2015 09:25 AM   Modules accepted: Medications

## 2015-10-27 NOTE — Progress Notes (Signed)
OK to treat today with creat-2.7 per Dr. Julien Nordmann.

## 2015-10-27 NOTE — Telephone Encounter (Signed)
Gave and printed appt sched and avs fo rpt for march and April

## 2015-10-27 NOTE — Progress Notes (Signed)
[place] [phone]   [phone]  OFFICE PROGRESS NOTE  [name], [name], MD [place] [address]  DIAGNOSIS:  1) Stage IV (T2b, N2, M1b) non-small cell lung cancer, adenocarcinoma with negative EGFR mutation and negative gene translocation presented with a large right hilar mass as well as mediastinal lymphadenopathy and metastatic disease to the liver, bone and pancreas diagnosed in February 2016. 2) prostate adenocarcinoma diagnosed at [facility] with Gleason score 9 (4+5).  PRIOR THERAPY: Systemic chemotherapy with carboplatin for AUC of 5 and Alimta 500 MG/M2 every 3 weeks. Status post 6 cycles.  CURRENT THERAPY:  1) Immunotherapy with Nivolumab 240 MG every 2 weeks, status post 13 cycles. 2) Lupron and Casodex for the recently diagnosed prostate adenocarcinoma at [facility]: Francisco Love 55 y.o. male returns to the clinic today for follow-up visit accompanied by his brother-in-law. The patient is tolerating his current treatment with immunotherapy with Nivolumab fairly well. He was admitted recently to [facility] with small bowel obstruction and was treated conservatively with gastric suctioning and bowel rest. He missed the last cycle of his treatment with immunotherapy. He is feeling much better today. He denied having any specific complaints. He denied having any significant nausea or vomiting. He has no diarrhea. The patient denied having any fever or chills, no significant weight loss or night sweats. He has no chest pain, shortness of breath but has mild cough with no hemoptysis. He is here today to start cycle #14 of his immunotherapy.  MEDICAL HISTORY: Past Medical History  Diagnosis Date  . Shortness of breath dyspnea     ALLERGIES:  has No Known Allergies.  MEDICATIONS:  Current Outpatient Prescriptions  Medication Sig Dispense Refill  . acetaminophen (TYLENOL) 325  MG tablet Take 2 tablets (650 mg total) by mouth every 6 (six) hours as needed for mild pain (or Fever >/= 101).    Marland Kitchen albuterol (PROVENTIL) (2.5 MG/3ML) 0.083% nebulizer solution Take 3 mLs (2.5 mg total) by nebulization every 2 (two) hours as needed for wheezing. 75 mL 12  . AMITIZA 24 MCG capsule     . bicalutamide (CASODEX) 50 MG tablet Take 50 mg by mouth.    . clobetasol ointment (TEMOVATE) 0.05 % Apply topically.    . diphenhydrAMINE (BENADRYL) 25 MG tablet Take 25 mg by mouth.    . diphenhydrAMINE (SOMINEX) 25 MG tablet Take 25 mg by mouth as needed for itching or sleep.    Marland Kitchen doxycycline (ADOXA) 100 MG tablet     . ENSURE PLUS (ENSURE PLUS) LIQD Take by mouth.    . feeding supplement, ENSURE COMPLETE, (ENSURE COMPLETE) LIQD Take 237 mLs by mouth 3 (three) times daily between meals.    Marland Kitchen levofloxacin (LEVAQUIN) 500 MG tablet     . lidocaine-prilocaine (EMLA) cream Apply 1 application topically as needed. Apply small amount  to port site 1.5 hours prior to chemotherapy. Do not rub in cream . Cover with cling wrap 30 g 0  . mirtazapine (REMERON) 15 MG tablet Take 1 tablet (15 mg total) by mouth at bedtime.    Marland Kitchen oxyCODONE-acetaminophen (PERCOCET/ROXICET) 5-325 MG tablet Take 1 tablet by mouth every 4 (four) hours as needed for severe pain. 60 tablet 0  . polyethylene glycol (MIRALAX / GLYCOLAX) packet Take 17 g by mouth daily.    . prochlorperazine (COMPAZINE) 10 MG tablet Take 1 tablet (10 mg total) by mouth  every 6 (six) hours as needed for nausea or vomiting. 30 tablet 1  . senna-docusate (SENOKOT-S) 8.6-50 MG per tablet Take 1 tablet by mouth daily.    . tamsulosin (FLOMAX) 0.4 MG CAPS capsule     . valACYclovir (VALTREX) 1000 MG tablet      No current facility-administered medications for this visit.    SURGICAL HISTORY:  Past Surgical History  Procedure Laterality Date  . Appendectomy    . Video bronchoscopy N/A 09/21/2014    Procedure: VIDEO BRONCHOSCOPY WITH FLUORO;  Surgeon:  Rigoberto Noel, MD;  Location: [place];  Service: Cardiopulmonary;  Laterality: N/A;  . Video bronchoscopy with endobronchial ultrasound N/A 09/23/2014    Procedure: VIDEO BRONCHOSCOPY WITH ENDOBRONCHIAL ULTRASOUND;  Surgeon: Rigoberto Noel, MD;  Location: [place];  Service: Pulmonary;  Laterality: N/A;    REVIEW OF SYSTEMS:  A comprehensive review of systems was negative except for: Musculoskeletal: positive for back pain   PHYSICAL EXAMINATION: General appearance: alert, cooperative, fatigued and no distress Head: Normocephalic, without obvious abnormality, atraumatic Neck: no adenopathy, no JVD, supple, symmetrical, trachea midline and thyroid not enlarged, symmetric, no tenderness/mass/nodules Lymph nodes: Cervical, supraclavicular, and axillary nodes normal. Resp: clear to auscultation bilaterally Back: symmetric, no curvature. ROM normal. No CVA tenderness. Cardio: regular rate and rhythm, S1, S2 normal, no murmur, click, rub or gallop GI: soft, non-tender; bowel sounds normal; no masses,  no organomegaly Extremities: extremities normal, atraumatic, no cyanosis or edema Neurologic: Alert and oriented X 3, normal strength and tone. Normal symmetric reflexes. Normal coordination and gait  ECOG PERFORMANCE STATUS: 2 - Symptomatic, <50% confined to bed  Blood pressure 113/79, pulse 102, temperature 97.6 F (36.4 C), temperature source Oral, resp. rate 20, weight 135 lb 9.6 oz (61.508 kg), SpO2 100 %.  LABORATORY DATA: Lab Results  Component Value Date   WBC 12.1* 10/27/2015   HGB 11.1* 10/27/2015   HCT 34.8* 10/27/2015   MCV 87.0 10/27/2015   PLT 306 10/27/2015      Chemistry      Component Value Date/Time   NA 136 10/27/2015 0732   NA 135 10/25/2014 0845   K 4.7 10/27/2015 0732   K 5.3* 10/25/2014 0845   CL 108 10/25/2014 0845   CO2 20* 10/27/2015 0732   CO2 19 10/25/2014 0845   BUN 47.3* 10/27/2015 0732   BUN 22 10/25/2014 0845   CREATININE 2.7* 10/27/2015 0732    CREATININE 1.03 10/25/2014 0845      Component Value Date/Time   CALCIUM 8.5 10/27/2015 0732   CALCIUM 8.6 10/25/2014 0845   ALKPHOS 225* 10/27/2015 0732   ALKPHOS 882* 09/20/2014 1120   AST 17 10/27/2015 0732   AST 29 09/20/2014 1120   ALT 17 10/27/2015 0732   ALT 34 09/20/2014 1120   BILITOT <0.30 10/27/2015 0732   BILITOT 0.1* 09/20/2014 1120       RADIOGRAPHIC STUDIES: Ct Abdomen Pelvis Wo Contrast  09/28/2015  CLINICAL DATA:  Lung cancer diagnosed 2/16. History of prostate cancer. Chemotherapy in progress. No current complaints. Metastatic adenocarcinoma involving skeletal bone. EXAM: CT CHEST, ABDOMEN AND PELVIS WITHOUT CONTRAST TECHNIQUE: Multidetector CT imaging of the chest, abdomen and pelvis was performed following the standard protocol without IV contrast. COMPARISON:  08/03/2015 FINDINGS: CT CHEST Mediastinum/Nodes: A right Port-A-Cath which terminates at the high right atrium. Thoracic aortic atherosclerosis. Mild cardiomegaly. LAD coronary artery atherosclerosis. Right paratracheal adenopathy measures 2.7 cm on image 18/series 2 versus 3.0 cm on the prior exam (when remeasured).  Precarinal component measures 1.8 cm on image 24/series 2 today versus 2.4 cm on the prior exam (when remeasured). Node within the subcarinal station with extension into the azygoesophageal recess measures 12 mm today versus 14 mm on the prior. Partially calcified right hilar adenopathy is suboptimally evaluated but likely mildly decreased. Lungs/Pleura: Tiny left pleural effusion and trace right pleural fluid are similar. Moderate centrilobular emphysema. 4 mm right lower lobe pulmonary nodule on image 32 is similar (when remeasured). Partially calcified left upper lobe pulmonary nodule measures 8 mm on image 25/series 4 and is similar (when remeasured). A 3 mm left apical nodule is similar on image 9/series 4. No enlarging pulmonary nodules. Musculoskeletal: Widespread sclerotic osseous metastasis. Index  right-sided T11 lesion measures 2.0 cm today versus 1.7 cm on the prior. CT ABDOMEN AND PELVIS Hepatobiliary: Mild degradation secondary to patient arm position, not raised above the head. Left hepatic lobe cyst of less than a cm. Normal gallbladder, without biliary ductal dilatation. Pancreas: Pancreatic calcifications within the head are likely related to prior pancreatitis. No dominant mass or duct dilatation identified. Spleen: Normal in size, without focal abnormality. Adrenals/Urinary Tract: Normal adrenal glands. Mild left renal cortical thinning. At least partially duplicated right renal collecting system. Normal urinary bladder. Stomach/Bowel: Proximal gastric underdistention. Colonic stool burden suggests constipation. Normal small bowel. Vascular/Lymphatic: Aortic and branch vessel atherosclerosis. No abdominopelvic adenopathy. Reproductive: Mild prostatomegaly. Other: No significant free fluid. Musculoskeletal: Advanced osteoarthritis of both hips. Increased sclerosis involving the left ischium on image 118/series 2. Posterior left acetabular sclerosis is also increased, including on image 112/series 2. IMPRESSION: CT CHEST IMPRESSION 1. Response to therapy of thoracic adenopathy. 2. Similar small pulmonary nodules. 3. Tiny left greater than right pleural effusions are not significantly changed. 4. Age advanced coronary artery atherosclerosis. Recommend assessment of coronary risk factors and consideration of medical therapy. CT ABDOMEN AND PELVIS IMPRESSION 1. No extraosseous metastasis within the abdomen and pelvis. 2. Increased number and size of sclerotic lesions throughout the bones. Favor healing of osseous metastasis. Electronically Signed   By: Abigail Miyamoto M.D.   On: 09/28/2015 09:34   Ct Chest Wo Contrast  09/28/2015  CLINICAL DATA:  Lung cancer diagnosed 2/16. History of prostate cancer. Chemotherapy in progress. No current complaints. Metastatic adenocarcinoma involving skeletal bone. EXAM:  CT CHEST, ABDOMEN AND PELVIS WITHOUT CONTRAST TECHNIQUE: Multidetector CT imaging of the chest, abdomen and pelvis was performed following the standard protocol without IV contrast. COMPARISON:  08/03/2015 FINDINGS: CT CHEST Mediastinum/Nodes: A right Port-A-Cath which terminates at the high right atrium. Thoracic aortic atherosclerosis. Mild cardiomegaly. LAD coronary artery atherosclerosis. Right paratracheal adenopathy measures 2.7 cm on image 18/series 2 versus 3.0 cm on the prior exam (when remeasured). Precarinal component measures 1.8 cm on image 24/series 2 today versus 2.4 cm on the prior exam (when remeasured). Node within the subcarinal station with extension into the azygoesophageal recess measures 12 mm today versus 14 mm on the prior. Partially calcified right hilar adenopathy is suboptimally evaluated but likely mildly decreased. Lungs/Pleura: Tiny left pleural effusion and trace right pleural fluid are similar. Moderate centrilobular emphysema. 4 mm right lower lobe pulmonary nodule on image 32 is similar (when remeasured). Partially calcified left upper lobe pulmonary nodule measures 8 mm on image 25/series 4 and is similar (when remeasured). A 3 mm left apical nodule is similar on image 9/series 4. No enlarging pulmonary nodules. Musculoskeletal: Widespread sclerotic osseous metastasis. Index right-sided T11 lesion measures 2.0 cm today versus 1.7 cm on the prior.  CT ABDOMEN AND PELVIS Hepatobiliary: Mild degradation secondary to patient arm position, not raised above the head. Left hepatic lobe cyst of less than a cm. Normal gallbladder, without biliary ductal dilatation. Pancreas: Pancreatic calcifications within the head are likely related to prior pancreatitis. No dominant mass or duct dilatation identified. Spleen: Normal in size, without focal abnormality. Adrenals/Urinary Tract: Normal adrenal glands. Mild left renal cortical thinning. At least partially duplicated right renal collecting  system. Normal urinary bladder. Stomach/Bowel: Proximal gastric underdistention. Colonic stool burden suggests constipation. Normal small bowel. Vascular/Lymphatic: Aortic and branch vessel atherosclerosis. No abdominopelvic adenopathy. Reproductive: Mild prostatomegaly. Other: No significant free fluid. Musculoskeletal: Advanced osteoarthritis of both hips. Increased sclerosis involving the left ischium on image 118/series 2. Posterior left acetabular sclerosis is also increased, including on image 112/series 2. IMPRESSION: CT CHEST IMPRESSION 1. Response to therapy of thoracic adenopathy. 2. Similar small pulmonary nodules. 3. Tiny left greater than right pleural effusions are not significantly changed. 4. Age advanced coronary artery atherosclerosis. Recommend assessment of coronary risk factors and consideration of medical therapy. CT ABDOMEN AND PELVIS IMPRESSION 1. No extraosseous metastasis within the abdomen and pelvis. 2. Increased number and size of sclerotic lesions throughout the bones. Favor healing of osseous metastasis. Electronically Signed   By: Abigail Miyamoto M.D.   On: 09/28/2015 09:34   ASSESSMENT AND PLAN: This is a very pleasant 55 years old African-American male with: 1)  metastatic non-small cell lung cancer, adenocarcinoma status post induction chemotherapy with carboplatin and Alimta with initial good response followed by disease progression. The patient is currently on second line treatment with immunotherapy with Nivolumab status post 13 cycles. He tolerated the last cycle of his treatment fairly well with no concerning adverse effects.  We will proceed with cycle #14 today as scheduled.  2) recently diagnosed prostate adenocarcinoma: He is followed at [facility] for this problem. He is currently on hormonal therapy with Lupron and Casodex. 3) pain management, he would continue on Percocet as needed. He was given a refill of this medication today. 4) metastatic bone disease, I  discussed with the patient consideration of treatment with Xgeva after receive dental clearance. He is still reluctant about new treatment. The patient would come back for follow-up visit in 2 weeks for reevaluation before starting cycle #15. He was advised to call immediately if he has any concerning symptoms in the interval. The patient voices understanding of current disease status and treatment options and is in agreement with the current care plan.  All questions were answered. The patient knows to call the clinic with any problems, questions or concerns. We can certainly see the patient much sooner if necessary.  Disclaimer: This note was dictated with voice recognition software. Similar sounding words can inadvertently be transcribed and may not be corrected upon review.

## 2015-11-10 ENCOUNTER — Ambulatory Visit (HOSPITAL_BASED_OUTPATIENT_CLINIC_OR_DEPARTMENT_OTHER): Payer: Medicare Other | Admitting: Internal Medicine

## 2015-11-10 ENCOUNTER — Ambulatory Visit (HOSPITAL_BASED_OUTPATIENT_CLINIC_OR_DEPARTMENT_OTHER): Payer: Medicare Other

## 2015-11-10 ENCOUNTER — Other Ambulatory Visit (HOSPITAL_BASED_OUTPATIENT_CLINIC_OR_DEPARTMENT_OTHER): Payer: Medicare Other

## 2015-11-10 ENCOUNTER — Encounter: Payer: Self-pay | Admitting: Internal Medicine

## 2015-11-10 ENCOUNTER — Telehealth: Payer: Self-pay | Admitting: Internal Medicine

## 2015-11-10 VITALS — BP 94/75 | HR 116 | Temp 97.6°F | Resp 18 | Ht 69.0 in | Wt 136.7 lb

## 2015-11-10 DIAGNOSIS — C7889 Secondary malignant neoplasm of other digestive organs: Secondary | ICD-10-CM

## 2015-11-10 DIAGNOSIS — C3401 Malignant neoplasm of right main bronchus: Secondary | ICD-10-CM

## 2015-11-10 DIAGNOSIS — C61 Malignant neoplasm of prostate: Secondary | ICD-10-CM

## 2015-11-10 DIAGNOSIS — E032 Hypothyroidism due to medicaments and other exogenous substances: Secondary | ICD-10-CM

## 2015-11-10 DIAGNOSIS — C3491 Malignant neoplasm of unspecified part of right bronchus or lung: Secondary | ICD-10-CM

## 2015-11-10 DIAGNOSIS — Z5112 Encounter for antineoplastic immunotherapy: Secondary | ICD-10-CM | POA: Diagnosis present

## 2015-11-10 DIAGNOSIS — G893 Neoplasm related pain (acute) (chronic): Secondary | ICD-10-CM

## 2015-11-10 DIAGNOSIS — C7951 Secondary malignant neoplasm of bone: Secondary | ICD-10-CM | POA: Diagnosis not present

## 2015-11-10 DIAGNOSIS — Z79899 Other long term (current) drug therapy: Secondary | ICD-10-CM

## 2015-11-10 DIAGNOSIS — C787 Secondary malignant neoplasm of liver and intrahepatic bile duct: Secondary | ICD-10-CM | POA: Diagnosis not present

## 2015-11-10 DIAGNOSIS — C801 Malignant (primary) neoplasm, unspecified: Principal | ICD-10-CM

## 2015-11-10 LAB — COMPREHENSIVE METABOLIC PANEL
ALT: 29 U/L (ref 0–55)
ANION GAP: 12 meq/L — AB (ref 3–11)
AST: 21 U/L (ref 5–34)
Albumin: 3.3 g/dL — ABNORMAL LOW (ref 3.5–5.0)
Alkaline Phosphatase: 200 U/L — ABNORMAL HIGH (ref 40–150)
BUN: 51.2 mg/dL — AB (ref 7.0–26.0)
CALCIUM: 8.8 mg/dL (ref 8.4–10.4)
CHLORIDE: 112 meq/L — AB (ref 98–109)
CO2: 16 meq/L — AB (ref 22–29)
CREATININE: 2.8 mg/dL — AB (ref 0.7–1.3)
EGFR: 28 mL/min/{1.73_m2} — ABNORMAL LOW (ref >90)
Glucose: 89 mg/dl (ref 70–140)
Potassium: 5.1 mEq/L (ref 3.5–5.1)
Sodium: 139 mEq/L (ref 136–145)
TOTAL PROTEIN: 8.3 g/dL (ref 6.4–8.3)

## 2015-11-10 LAB — CBC WITH DIFFERENTIAL/PLATELET
BASO%: 1 % (ref 0.0–2.0)
Basophils Absolute: 0.1 10*3/uL (ref 0.0–0.1)
EOS%: 8.9 % — AB (ref 0.0–7.0)
Eosinophils Absolute: 0.9 10*3/uL — ABNORMAL HIGH (ref 0.0–0.5)
HEMATOCRIT: 35.6 % — AB (ref 38.4–49.9)
HGB: 11.6 g/dL — ABNORMAL LOW (ref 13.0–17.1)
LYMPH#: 2.1 10*3/uL (ref 0.9–3.3)
LYMPH%: 20.7 % (ref 14.0–49.0)
MCH: 28.7 pg (ref 27.2–33.4)
MCHC: 32.5 g/dL (ref 32.0–36.0)
MCV: 88.2 fL (ref 79.3–98.0)
MONO#: 0.9 10*3/uL (ref 0.1–0.9)
MONO%: 8.5 % (ref 0.0–14.0)
NEUT%: 60.9 % (ref 39.0–75.0)
NEUTROS ABS: 6.3 10*3/uL (ref 1.5–6.5)
PLATELETS: 337 10*3/uL (ref 140–400)
RBC: 4.03 10*6/uL — AB (ref 4.20–5.82)
RDW: 19.6 % — ABNORMAL HIGH (ref 11.0–14.6)
WBC: 10.4 10*3/uL — AB (ref 4.0–10.3)

## 2015-11-10 LAB — TSH: TSH: 1.619 m[IU]/L (ref 0.320–4.118)

## 2015-11-10 MED ORDER — SODIUM CHLORIDE 0.9 % IJ SOLN
10.0000 mL | INTRAMUSCULAR | Status: DC | PRN
  Administered 2015-11-10: 10 mL
  Filled 2015-11-10: qty 10

## 2015-11-10 MED ORDER — OXYCODONE-ACETAMINOPHEN 5-325 MG PO TABS: 1.0000 | ORAL_TABLET | ORAL | Status: DC | PRN

## 2015-11-10 MED ORDER — HEPARIN SOD (PORK) LOCK FLUSH 100 UNIT/ML IV SOLN
500.0000 [IU] | Freq: Once | INTRAVENOUS | Status: AC | PRN
  Administered 2015-11-10: 500 [IU]
  Filled 2015-11-10: qty 5

## 2015-11-10 MED ORDER — SODIUM CHLORIDE 0.9 % IV SOLN
240.0000 mg | Freq: Once | INTRAVENOUS | Status: AC
  Administered 2015-11-10: 240 mg via INTRAVENOUS
  Filled 2015-11-10: qty 20

## 2015-11-10 MED ORDER — SODIUM CHLORIDE 0.9 % IV SOLN
Freq: Once | INTRAVENOUS | Status: AC
  Administered 2015-11-10: 10:00:00 via INTRAVENOUS

## 2015-11-10 NOTE — Progress Notes (Signed)
Ok to treat with today's creatinine per Dr. Julien Nordmann.

## 2015-11-10 NOTE — Progress Notes (Signed)
[place] [phone]   [phone]) [phone]  OFFICE PROGRESS NOTE  [name], [name], MD [place] [address]  DIAGNOSIS:  1) Stage IV (T2b, N2, M1b) non-small cell lung cancer, adenocarcinoma with negative EGFR mutation and negative gene translocation presented with a large right hilar mass as well as mediastinal lymphadenopathy and metastatic disease to the liver, bone and pancreas diagnosed in February 2016. 2) prostate adenocarcinoma diagnosed at [facility] with Gleason score 9 (4+5).  PRIOR THERAPY: Systemic chemotherapy with carboplatin for AUC of 5 and Alimta 500 MG/M2 every 3 weeks. Status post 6 cycles.  CURRENT THERAPY:  1) Immunotherapy with Nivolumab 240 MG every 2 weeks, status post 14 cycles. 2) Lupron and Casodex for the recently diagnosed prostate adenocarcinoma at [place]: Francisco Love 55 y.o. male returns to the clinic today for follow-up visit accompanied by his brother-in-law. The patient is tolerating his current treatment with immunotherapy with Nivolumab fairly well. He denied having any specific complaints. He denied having any significant nausea or vomiting. He has no diarrhea. The patient denied having any fever or chills, no significant weight loss or night sweats. He has no chest pain, shortness of breath but has mild cough with no hemoptysis. He is here today to start cycle #15 of his immunotherapy. He is requesting refill of his pain medication.  MEDICAL HISTORY: Past Medical History  Diagnosis Date  . Shortness of breath dyspnea     ALLERGIES:  has No Known Allergies.  MEDICATIONS:  Current Outpatient Prescriptions  Medication Sig Dispense Refill  . acetaminophen (TYLENOL) 325 MG tablet Take 2 tablets (650 mg total) by mouth every 6 (six) hours as needed for mild pain (or Fever >/= 101).    Marland Kitchen albuterol (PROVENTIL) (2.5 MG/3ML) 0.083% nebulizer solution Take 3 mLs  (2.5 mg total) by nebulization every 2 (two) hours as needed for wheezing. 75 mL 12  . AMITIZA 24 MCG capsule     . bicalutamide (CASODEX) 50 MG tablet Take 50 mg by mouth.    . clobetasol ointment (TEMOVATE) 0.05 % Apply topically.    . diphenhydrAMINE (BENADRYL) 25 MG tablet Take 25 mg by mouth.    . diphenhydrAMINE (SOMINEX) 25 MG tablet Take 25 mg by mouth as needed for itching or sleep.    Marland Kitchen doxycycline (VIBRA-TABS) 100 MG tablet Take 100 mg by mouth.    . ENSURE PLUS (ENSURE PLUS) LIQD Take by mouth.    . feeding supplement, ENSURE COMPLETE, (ENSURE COMPLETE) LIQD Take 237 mLs by mouth 3 (three) times daily between meals.    Marland Kitchen levofloxacin (LEVAQUIN) 500 MG tablet     . lidocaine-prilocaine (EMLA) cream Apply 1 application topically as needed. Apply small amount  to port site 1.5 hours prior to chemotherapy. Do not rub in cream . Cover with cling wrap 30 g 0  . mirtazapine (REMERON) 15 MG tablet Take 1 tablet (15 mg total) by mouth at bedtime.    Marland Kitchen oxyCODONE-acetaminophen (PERCOCET/ROXICET) 5-325 MG tablet Take 1 tablet by mouth every 4 (four) hours as needed for severe pain. 60 tablet 0  . polyethylene glycol (MIRALAX / GLYCOLAX) packet Take 17 g by mouth daily.    . prochlorperazine (COMPAZINE) 10 MG tablet Take 1 tablet (10 mg total) by mouth every 6 (six) hours as needed for nausea or vomiting. 30 tablet 1  . senna-docusate (SENOKOT-S) 8.6-50 MG per tablet Take 1 tablet by  mouth daily.    . tamsulosin (FLOMAX) 0.4 MG CAPS capsule     . valACYclovir (VALTREX) 1000 MG tablet     . doxycycline (ADOXA) 100 MG tablet      No current facility-administered medications for this visit.    SURGICAL HISTORY:  Past Surgical History  Procedure Laterality Date  . Appendectomy    . Video bronchoscopy N/A 09/21/2014    Procedure: VIDEO BRONCHOSCOPY WITH FLUORO;  Surgeon: Rigoberto Noel, MD;  Location: [place];  Service: Cardiopulmonary;  Laterality: N/A;  . Video bronchoscopy with  endobronchial ultrasound N/A 09/23/2014    Procedure: VIDEO BRONCHOSCOPY WITH ENDOBRONCHIAL ULTRASOUND;  Surgeon: Rigoberto Noel, MD;  Location: [place];  Service: Pulmonary;  Laterality: N/A;    REVIEW OF SYSTEMS:  A comprehensive review of systems was negative except for: Musculoskeletal: positive for back pain   PHYSICAL EXAMINATION: General appearance: alert, cooperative, fatigued and no distress Head: Normocephalic, without obvious abnormality, atraumatic Neck: no adenopathy, no JVD, supple, symmetrical, trachea midline and thyroid not enlarged, symmetric, no tenderness/mass/nodules Lymph nodes: Cervical, supraclavicular, and axillary nodes normal. Resp: clear to auscultation bilaterally Back: symmetric, no curvature. ROM normal. No CVA tenderness. Cardio: regular rate and rhythm, S1, S2 normal, no murmur, click, rub or gallop GI: soft, non-tender; bowel sounds normal; no masses,  no organomegaly Extremities: extremities normal, atraumatic, no cyanosis or edema Neurologic: Alert and oriented X 3, normal strength and tone. Normal symmetric reflexes. Normal coordination and gait  ECOG PERFORMANCE STATUS: 2 - Symptomatic, <50% confined to bed  Blood pressure 94/75, pulse 116, temperature 97.6 F (36.4 C), temperature source Oral, resp. rate 18, height _0  (1.753 m), weight 136 lb 11.2 oz (62.007 kg), SpO2 99 %.  LABORATORY DATA: Lab Results  Component Value Date   WBC 10.4* 11/10/2015   HGB 11.6* 11/10/2015   HCT 35.6* 11/10/2015   MCV 88.2 11/10/2015   PLT 337 11/10/2015      Chemistry      Component Value Date/Time   NA 136 10/27/2015 0732   NA 135 10/25/2014 0845   K 4.7 10/27/2015 0732   K 5.3* 10/25/2014 0845   CL 108 10/25/2014 0845   CO2 20* 10/27/2015 0732   CO2 19 10/25/2014 0845   BUN 47.3* 10/27/2015 0732   BUN 22 10/25/2014 0845   CREATININE 2.7* 10/27/2015 0732   CREATININE 1.03 10/25/2014 0845      Component Value Date/Time   CALCIUM 8.5 10/27/2015 0732    CALCIUM 8.6 10/25/2014 0845   ALKPHOS 225* 10/27/2015 0732   ALKPHOS 882* 09/20/2014 1120   AST 17 10/27/2015 0732   AST 29 09/20/2014 1120   ALT 17 10/27/2015 0732   ALT 34 09/20/2014 1120   BILITOT <0.30 10/27/2015 0732   BILITOT 0.1* 09/20/2014 1120       RADIOGRAPHIC STUDIES: No results found. ASSESSMENT AND PLAN: This is a very pleasant 55 years old African-American male with: 1)  metastatic non-small cell lung cancer, adenocarcinoma status post induction chemotherapy with carboplatin and Alimta with initial good response followed by disease progression. The patient is currently on second line treatment with immunotherapy with Nivolumab status post 14 cycles. He tolerated the last cycle of his treatment fairly well with no concerning adverse effects.  We will proceed with cycle #15 today as scheduled. 2) recently diagnosed prostate adenocarcinoma: He is followed at [facility] for this problem. He is currently on hormonal therapy with Lupron and Casodex. 3) pain management, he would  continue on Percocet as needed. He was given a refill of this medication today. 4) metastatic bone disease, I discussed with the patient consideration of treatment with Xgeva after receive dental clearance. He is still reluctant about new treatment. The patient would come back for follow-up visit in 2 weeks for reevaluation before starting cycle #16. He was advised to call immediately if he has any concerning symptoms in the interval. The patient voices understanding of current disease status and treatment options and is in agreement with the current care plan.  All questions were answered. The patient knows to call the clinic with any problems, questions or concerns. We can certainly see the patient much sooner if necessary.  Disclaimer: This note was dictated with voice recognition software. Similar sounding words can inadvertently be transcribed and may not be corrected upon review.

## 2015-11-10 NOTE — Telephone Encounter (Signed)
Gave and printed appt sched and avs for pt for April and May °

## 2015-11-24 ENCOUNTER — Encounter: Payer: Self-pay | Admitting: Internal Medicine

## 2015-11-24 ENCOUNTER — Ambulatory Visit (HOSPITAL_BASED_OUTPATIENT_CLINIC_OR_DEPARTMENT_OTHER): Payer: Medicare Other | Admitting: Internal Medicine

## 2015-11-24 ENCOUNTER — Ambulatory Visit (HOSPITAL_BASED_OUTPATIENT_CLINIC_OR_DEPARTMENT_OTHER): Payer: Medicare Other

## 2015-11-24 ENCOUNTER — Other Ambulatory Visit (HOSPITAL_BASED_OUTPATIENT_CLINIC_OR_DEPARTMENT_OTHER): Payer: Medicare Other

## 2015-11-24 ENCOUNTER — Telehealth: Payer: Self-pay | Admitting: Internal Medicine

## 2015-11-24 VITALS — BP 107/80 | HR 110 | Temp 97.7°F | Resp 17 | Ht 69.0 in | Wt 139.0 lb

## 2015-11-24 DIAGNOSIS — C3491 Malignant neoplasm of unspecified part of right bronchus or lung: Secondary | ICD-10-CM

## 2015-11-24 DIAGNOSIS — Z5112 Encounter for antineoplastic immunotherapy: Secondary | ICD-10-CM

## 2015-11-24 DIAGNOSIS — C7889 Secondary malignant neoplasm of other digestive organs: Secondary | ICD-10-CM | POA: Diagnosis not present

## 2015-11-24 DIAGNOSIS — C801 Malignant (primary) neoplasm, unspecified: Secondary | ICD-10-CM

## 2015-11-24 DIAGNOSIS — C3401 Malignant neoplasm of right main bronchus: Secondary | ICD-10-CM | POA: Diagnosis present

## 2015-11-24 DIAGNOSIS — G893 Neoplasm related pain (acute) (chronic): Secondary | ICD-10-CM

## 2015-11-24 DIAGNOSIS — C787 Secondary malignant neoplasm of liver and intrahepatic bile duct: Secondary | ICD-10-CM | POA: Diagnosis not present

## 2015-11-24 DIAGNOSIS — C7951 Secondary malignant neoplasm of bone: Secondary | ICD-10-CM | POA: Diagnosis not present

## 2015-11-24 DIAGNOSIS — R21 Rash and other nonspecific skin eruption: Secondary | ICD-10-CM

## 2015-11-24 DIAGNOSIS — C61 Malignant neoplasm of prostate: Secondary | ICD-10-CM

## 2015-11-24 LAB — COMPREHENSIVE METABOLIC PANEL
ALT: 28 U/L (ref 0–55)
ANION GAP: 10 meq/L (ref 3–11)
AST: 29 U/L (ref 5–34)
Albumin: 3.1 g/dL — ABNORMAL LOW (ref 3.5–5.0)
Alkaline Phosphatase: 145 U/L (ref 40–150)
BUN: 45.6 mg/dL — AB (ref 7.0–26.0)
CALCIUM: 9.2 mg/dL (ref 8.4–10.4)
CHLORIDE: 114 meq/L — AB (ref 98–109)
CO2: 15 meq/L — AB (ref 22–29)
Creatinine: 3.1 mg/dL (ref 0.7–1.3)
EGFR: 24 mL/min/{1.73_m2} — ABNORMAL LOW (ref >90)
Glucose: 86 mg/dl (ref 70–140)
POTASSIUM: 5 meq/L (ref 3.5–5.1)
Sodium: 139 mEq/L (ref 136–145)
Total Bilirubin: <0.3 mg/dL (ref 0.20–1.20)
Total Protein: 8.3 g/dL (ref 6.4–8.3)

## 2015-11-24 LAB — CBC WITH DIFFERENTIAL/PLATELET
BASO%: 0.6 % (ref 0.0–2.0)
BASOS ABS: 0.1 10*3/uL (ref 0.0–0.1)
EOS%: 9.3 % — AB (ref 0.0–7.0)
Eosinophils Absolute: 0.9 10*3/uL — ABNORMAL HIGH (ref 0.0–0.5)
HEMATOCRIT: 34.1 % — AB (ref 38.4–49.9)
HGB: 11 g/dL — ABNORMAL LOW (ref 13.0–17.1)
LYMPH#: 2.4 10*3/uL (ref 0.9–3.3)
LYMPH%: 23.9 % (ref 14.0–49.0)
MCH: 28.8 pg (ref 27.2–33.4)
MCHC: 32.4 g/dL (ref 32.0–36.0)
MCV: 88.8 fL (ref 79.3–98.0)
MONO#: 0.9 10*3/uL (ref 0.1–0.9)
MONO%: 9.4 % (ref 0.0–14.0)
NEUT#: 5.6 10*3/uL (ref 1.5–6.5)
NEUT%: 56.8 % (ref 39.0–75.0)
Platelets: 307 10*3/uL (ref 140–400)
RBC: 3.84 10*6/uL — AB (ref 4.20–5.82)
RDW: 19.1 % — ABNORMAL HIGH (ref 11.0–14.6)
WBC: 9.9 10*3/uL (ref 4.0–10.3)

## 2015-11-24 MED ORDER — OXYCODONE-ACETAMINOPHEN 5-325 MG PO TABS: 1.0000 | ORAL_TABLET | ORAL | Status: DC | PRN

## 2015-11-24 MED ORDER — SODIUM CHLORIDE 0.9 % IJ SOLN
10.0000 mL | INTRAMUSCULAR | Status: DC | PRN
  Administered 2015-11-24: 10 mL
  Filled 2015-11-24: qty 10

## 2015-11-24 MED ORDER — SODIUM CHLORIDE 0.9 % IV SOLN
Freq: Once | INTRAVENOUS | Status: AC
Start: 2015-11-24 — End: 2015-11-24
  Administered 2015-11-24: 10:00:00 via INTRAVENOUS

## 2015-11-24 MED ORDER — SODIUM CHLORIDE 0.9 % IV SOLN
240.0000 mg | Freq: Once | INTRAVENOUS | Status: AC
  Administered 2015-11-24: 240 mg via INTRAVENOUS
  Filled 2015-11-24: qty 20

## 2015-11-24 MED ORDER — HEPARIN SOD (PORK) LOCK FLUSH 100 UNIT/ML IV SOLN
500.0000 [IU] | Freq: Once | INTRAVENOUS | Status: AC | PRN
  Administered 2015-11-24: 500 [IU]
  Filled 2015-11-24: qty 5

## 2015-11-24 NOTE — Patient Instructions (Signed)
Nivolumab injection What is this medicine? NIVOLUMAB (nye VOL ue mab) is a monoclonal antibody. It is used to treat melanoma, lung cancer, kidney cancer, and Hodgkin lymphoma. This medicine may be used for other purposes; ask your health care provider or pharmacist if you have questions. What should I tell my health care provider before I take this medicine? They need to know if you have any of these conditions: -diabetes -immune system problems -kidney disease -liver disease -lung disease -organ transplant -stomach or intestine problems -thyroid disease -an unusual or allergic reaction to nivolumab, other medicines, foods, dyes, or preservatives -pregnant or trying to get pregnant -breast-feeding How should I use this medicine? This medicine is for infusion into a vein. It is given by a health care professional in a hospital or clinic setting. A special MedGuide will be given to you before each treatment. Be sure to read this information carefully each time. Talk to your pediatrician regarding the use of this medicine in children. Special care may be needed. Overdosage: If you think you have taken too much of this medicine contact a poison control center or emergency room at once. NOTE: This medicine is only for you. Do not share this medicine with others. What if I miss a dose? It is important not to miss your dose. Call your doctor or health care professional if you are unable to keep an appointment. What may interact with this medicine? Interactions have not been studied. Give your health care provider a list of all the medicines, herbs, non-prescription drugs, or dietary supplements you use. Also tell them if you smoke, drink alcohol, or use illegal drugs. Some items may interact with your medicine. This list may not describe all possible interactions. Give your health care provider a list of all the medicines, herbs, non-prescription drugs, or dietary supplements you use. Also tell  them if you smoke, drink alcohol, or use illegal drugs. Some items may interact with your medicine. What should I watch for while using this medicine? This drug may make you feel generally unwell. Continue your course of treatment even though you feel ill unless your doctor tells you to stop. You may need blood work done while you are taking this medicine. Do not become pregnant while taking this medicine or for 5 months after stopping it. Women should inform their doctor if they wish to become pregnant or think they might be pregnant. There is a potential for serious side effects to an unborn child. Talk to your health care professional or pharmacist for more information. Do not breast-feed an infant while taking this medicine. What side effects may I notice from receiving this medicine? Side effects that you should report to your doctor or health care professional as soon as possible: -allergic reactions like skin rash, itching or hives, swelling of the face, lips, or tongue -black, tarry stools -blood in the urine -bloody or watery diarrhea -changes in vision -change in sex drive -changes in emotions or moods -chest pain -confusion -cough -decreased appetite -diarrhea -facial flushing -feeling faint or lightheaded -fever, chills -hair loss -hallucination, loss of contact with reality -headache -irritable -joint pain -loss of memory -muscle pain -muscle weakness -seizures -shortness of breath -signs and symptoms of high blood sugar such as dizziness; dry mouth; dry skin; fruity breath; nausea; stomach pain; increased hunger or thirst; increased urination -signs and symptoms of kidney injury like trouble passing urine or change in the amount of urine -signs and symptoms of liver injury like dark yellow or  brown urine; general ill feeling or flu-like symptoms; light-colored stools; loss of appetite; nausea; right upper belly pain; unusually weak or tired; yellowing of the eyes or  skin -stiff neck -swelling of the ankles, feet, hands -weight gain Side effects that usually do not require medical attention (report to your doctor or health care professional if they continue or are bothersome): -bone pain -constipation -tiredness -vomiting This list may not describe all possible side effects. Call your doctor for medical advice about side effects. You may report side effects to FDA at 1-800-FDA-1088. Where should I keep my medicine? This drug is given in a hospital or clinic and will not be stored at home. NOTE: This sheet is a summary. It may not cover all possible information. If you have questions about this medicine, talk to your doctor, pharmacist, or health care provider.    2016, [employer]vier/[employer] Standard. (2015-01-05 10:03:42)

## 2015-11-24 NOTE — Telephone Encounter (Signed)
Gave and printed appt shced and avs for pt for April and May

## 2015-11-24 NOTE — Progress Notes (Signed)
    [facility] [phone]   [phone]  OFFICE PROGRESS NOTE  Strachan Jr, Kenneth Francis, MD [address] [name] [address]  DIAGNOSIS:  1) Stage IV (T2b, N2, M1b) non-small cell lung cancer, adenocarcinoma with negative EGFR mutation and negative gene translocation presented with a large right hilar mass as well as mediastinal lymphadenopathy and metastatic disease to the liver, bone and pancreas diagnosed in February 2016. 2) prostate adenocarcinoma diagnosed at [facility] with Gleason score 9 (4+5).  PRIOR THERAPY: Systemic chemotherapy with carboplatin for AUC of 5 and Alimta 500 MG/M2 every 3 weeks. Status post 6 cycles.  CURRENT THERAPY:  1) Immunotherapy with Nivolumab 240 MG every 2 weeks, status post 15 cycles. 2) Lupron and Casodex for the recently diagnosed prostate adenocarcinoma at [facility]  INTERVAL HISTORY: Francisco Love 55 y.o. male returns to the clinic today for follow-up visit accompanied by his brother-in-law. The patient is tolerating his current treatment with immunotherapy with Nivolumab fairly well. He denied having any specific complaints except for dry skin and large area of dark skin rash on the back. He was seen at the emergency department at [place] [facility] yesterday and was getting IV injection questionable for steroid and prescription for Atarax. He denied having any significant nausea or vomiting. He has no diarrhea. The patient denied having any fever or chills, no significant weight loss or night sweats. He has no chest pain, shortness of breath but has mild cough with no hemoptysis. He is here today to start cycle #16 of his immunotherapy. He is requesting refill of his pain medication.  MEDICAL HISTORY: Past Medical History  Diagnosis Date  . Shortness of breath dyspnea     ALLERGIES:  has No Known Allergies.  MEDICATIONS:  Current Outpatient Prescriptions  Medication Sig Dispense Refill  .  acetaminophen (TYLENOL) 325 MG tablet Take 2 tablets (650 mg total) by mouth every 6 (six) hours as needed for mild pain (or Fever >/= 101).    . albuterol (PROVENTIL) (2.5 MG/3ML) 0.083% nebulizer solution Take 3 mLs (2.5 mg total) by nebulization every 2 (two) hours as needed for wheezing. 75 mL 12  . AMITIZA 24 MCG capsule     . bicalutamide (CASODEX) 50 MG tablet Take 50 mg by mouth.    . clobetasol ointment (TEMOVATE) 0.05 % Apply topically.    . diphenhydrAMINE (BENADRYL) 25 MG tablet Take 25 mg by mouth.    . diphenhydrAMINE (SOMINEX) 25 MG tablet Take 25 mg by mouth as needed for itching or sleep.    . doxycycline (ADOXA) 100 MG tablet     . doxycycline (VIBRA-TABS) 100 MG tablet Take 100 mg by mouth.    . ENSURE PLUS (ENSURE PLUS) LIQD Take by mouth.    . feeding supplement, ENSURE COMPLETE, (ENSURE COMPLETE) LIQD Take 237 mLs by mouth 3 (three) times daily between meals.    . levofloxacin (LEVAQUIN) 500 MG tablet     . lidocaine-prilocaine (EMLA) cream Apply 1 application topically as needed. Apply small amount  to port site 1.5 hours prior to chemotherapy. Do not rub in cream . Cover with cling wrap 30 g 0  . mirtazapine (REMERON) 15 MG tablet Take 1 tablet (15 mg total) by mouth at bedtime.    . oxyCODONE-acetaminophen (PERCOCET/ROXICET) 5-325 MG tablet Take 1 tablet by mouth every 4 (four) hours as needed for severe pain. 60 tablet 0  . polyethylene glycol (MIRALAX / GLYCOLAX) packet Take   17 g by mouth daily.    . prochlorperazine (COMPAZINE) 10 MG tablet Take 1 tablet (10 mg total) by mouth every 6 (six) hours as needed for nausea or vomiting. 30 tablet 1  . senna-docusate (SENOKOT-S) 8.6-50 MG per tablet Take 1 tablet by mouth daily.    . tamsulosin (FLOMAX) 0.4 MG CAPS capsule     . valACYclovir (VALTREX) 1000 MG tablet      No current facility-administered medications for this visit.    SURGICAL HISTORY:  Past Surgical History  Procedure Laterality Date  . Appendectomy      . Video bronchoscopy N/A 09/21/2014    Procedure: VIDEO BRONCHOSCOPY WITH FLUORO;  Surgeon: Rigoberto Noel, MD;  Location: [place];  Service: Cardiopulmonary;  Laterality: N/A;  . Video bronchoscopy with endobronchial ultrasound N/A 09/23/2014    Procedure: VIDEO BRONCHOSCOPY WITH ENDOBRONCHIAL ULTRASOUND;  Surgeon: Rigoberto Noel, MD;  Location: [place];  Service: Pulmonary;  Laterality: N/A;    REVIEW OF SYSTEMS:  A comprehensive review of systems was negative except for: Integument/breast: positive for pruritus and rash Musculoskeletal: positive for back pain   PHYSICAL EXAMINATION: General appearance: alert, cooperative, fatigued and no distress Head: Normocephalic, without obvious abnormality, atraumatic Neck: no adenopathy, no JVD, supple, symmetrical, trachea midline and thyroid not enlarged, symmetric, no tenderness/mass/nodules Lymph nodes: Cervical, supraclavicular, and axillary nodes normal. Resp: clear to auscultation bilaterally Back: symmetric, no curvature. ROM normal. No CVA tenderness. Cardio: regular rate and rhythm, S1, S2 normal, no murmur, click, rub or gallop GI: soft, non-tender; bowel sounds normal; no masses,  no organomegaly Extremities: extremities normal, atraumatic, no cyanosis or edema Neurologic: Alert and oriented X 3, normal strength and tone. Normal symmetric reflexes. Normal coordination and gait  ECOG PERFORMANCE STATUS: 2 - Symptomatic, <50% confined to bed  Blood pressure 107/80, pulse 110, temperature 97.7 F (36.5 C), temperature source Oral, resp. rate 17, height 5' 9" (1.753 m), weight 139 lb (63.05 kg), SpO2 96 %.  LABORATORY DATA: Lab Results  Component Value Date   WBC 9.9 11/24/2015   HGB 11.0* 11/24/2015   HCT 34.1* 11/24/2015   MCV 88.8 11/24/2015   PLT 307 11/24/2015      Chemistry      Component Value Date/Time   NA 139 11/10/2015 0816   NA 135 10/25/2014 0845   K 5.1 11/10/2015 0816   K 5.3* 10/25/2014 0845   CL 108  10/25/2014 0845   CO2 16* 11/10/2015 0816   CO2 19 10/25/2014 0845   BUN 51.2* 11/10/2015 0816   BUN 22 10/25/2014 0845   CREATININE 2.8* 11/10/2015 0816   CREATININE 1.03 10/25/2014 0845      Component Value Date/Time   CALCIUM 8.8 11/10/2015 0816   CALCIUM 8.6 10/25/2014 0845   ALKPHOS 200* 11/10/2015 0816   ALKPHOS 882* 09/20/2014 1120   AST 21 11/10/2015 0816   AST 29 09/20/2014 1120   ALT 29 11/10/2015 0816   ALT 34 09/20/2014 1120   BILITOT <0.30 11/10/2015 0816   BILITOT 0.1* 09/20/2014 1120       RADIOGRAPHIC STUDIES: No results found. ASSESSMENT AND PLAN: This is a very pleasant 55 years old African-American male with: 1)  metastatic non-small cell lung cancer, adenocarcinoma status post induction chemotherapy with carboplatin and Alimta with initial good response followed by disease progression. The patient is currently on second line treatment with immunotherapy with Nivolumab status post 15 cycles. He tolerated the last cycle of his treatment fairly well with no concerning  adverse effects except for dry skin and some areas of skin rash.  We will proceed with cycle #16 today as scheduled. 2) recently diagnosed prostate adenocarcinoma: He is followed at [facility] for this problem. He is currently on hormonal therapy with Lupron and Casodex. 3) pain management, he would continue on Percocet as needed. He was given a refill of this medication today. 4) metastatic bone disease, I discussed with the patient consideration of treatment with Xgeva after receive dental clearance. He is still reluctant about new treatment. The patient would come back for follow-up visit in 2 weeks for reevaluation before starting cycle #17 with repeat CT scan of the chest, abdomen and pelvis for restaging of his disease. He was advised to call immediately if he has any concerning symptoms in the interval. The patient voices understanding of current disease status and treatment options and is  in agreement with the current care plan.  All questions were answered. The patient knows to call the clinic with any problems, questions or concerns. We can certainly see the patient much sooner if necessary.  Disclaimer: This note was dictated with voice recognition software. Similar sounding words can inadvertently be transcribed and may not be corrected upon review.       

## 2015-11-24 NOTE — Progress Notes (Signed)
Ok to treat with creatinine 3.1 per Dr. Julien Nordmann.

## 2015-12-06 ENCOUNTER — Other Ambulatory Visit: Payer: Self-pay | Admitting: *Deleted

## 2015-12-06 DIAGNOSIS — C3491 Malignant neoplasm of unspecified part of right bronchus or lung: Secondary | ICD-10-CM

## 2015-12-06 NOTE — Progress Notes (Signed)
Reviewed with MD, Pt creatine 3.1 CT CAP ordered without Contrast

## 2015-12-07 ENCOUNTER — Telehealth: Payer: Self-pay | Admitting: Internal Medicine

## 2015-12-07 ENCOUNTER — Encounter (HOSPITAL_COMMUNITY): Payer: Self-pay

## 2015-12-07 ENCOUNTER — Ambulatory Visit (HOSPITAL_BASED_OUTPATIENT_CLINIC_OR_DEPARTMENT_OTHER): Payer: Medicare Other

## 2015-12-07 ENCOUNTER — Encounter: Payer: Self-pay | Admitting: Internal Medicine

## 2015-12-07 ENCOUNTER — Ambulatory Visit (HOSPITAL_BASED_OUTPATIENT_CLINIC_OR_DEPARTMENT_OTHER): Payer: Medicare Other | Admitting: Internal Medicine

## 2015-12-07 ENCOUNTER — Ambulatory Visit (HOSPITAL_COMMUNITY)
Admission: RE | Admit: 2015-12-07 | Discharge: 2015-12-07 | Disposition: A | Discharge status: Home / Self Care | Payer: Medicare Other | Source: Ambulatory Visit | Attending: Internal Medicine | Admitting: Internal Medicine

## 2015-12-07 ENCOUNTER — Other Ambulatory Visit (HOSPITAL_BASED_OUTPATIENT_CLINIC_OR_DEPARTMENT_OTHER): Payer: Medicare Other

## 2015-12-07 VITALS — BP 122/86 | HR 113 | Temp 97.6°F | Resp 19 | Ht 69.0 in | Wt 141.4 lb

## 2015-12-07 DIAGNOSIS — C3491 Malignant neoplasm of unspecified part of right bronchus or lung: Secondary | ICD-10-CM | POA: Diagnosis present

## 2015-12-07 DIAGNOSIS — C7951 Secondary malignant neoplasm of bone: Secondary | ICD-10-CM | POA: Diagnosis present

## 2015-12-07 DIAGNOSIS — R590 Localized enlarged lymph nodes: Secondary | ICD-10-CM | POA: Diagnosis not present

## 2015-12-07 DIAGNOSIS — C61 Malignant neoplasm of prostate: Secondary | ICD-10-CM | POA: Diagnosis not present

## 2015-12-07 DIAGNOSIS — Z9225 Personal history of immunosupression therapy: Secondary | ICD-10-CM | POA: Insufficient documentation

## 2015-12-07 DIAGNOSIS — C3401 Malignant neoplasm of right main bronchus: Secondary | ICD-10-CM

## 2015-12-07 DIAGNOSIS — Z5112 Encounter for antineoplastic immunotherapy: Secondary | ICD-10-CM

## 2015-12-07 DIAGNOSIS — M160 Bilateral primary osteoarthritis of hip: Secondary | ICD-10-CM | POA: Insufficient documentation

## 2015-12-07 DIAGNOSIS — J90 Pleural effusion, not elsewhere classified: Secondary | ICD-10-CM | POA: Insufficient documentation

## 2015-12-07 DIAGNOSIS — C787 Secondary malignant neoplasm of liver and intrahepatic bile duct: Secondary | ICD-10-CM

## 2015-12-07 DIAGNOSIS — R918 Other nonspecific abnormal finding of lung field: Secondary | ICD-10-CM | POA: Diagnosis not present

## 2015-12-07 DIAGNOSIS — N433 Hydrocele, unspecified: Secondary | ICD-10-CM | POA: Diagnosis not present

## 2015-12-07 DIAGNOSIS — C7889 Secondary malignant neoplasm of other digestive organs: Secondary | ICD-10-CM | POA: Diagnosis not present

## 2015-12-07 DIAGNOSIS — C801 Malignant (primary) neoplasm, unspecified: Secondary | ICD-10-CM

## 2015-12-07 DIAGNOSIS — G893 Neoplasm related pain (acute) (chronic): Secondary | ICD-10-CM

## 2015-12-07 LAB — CBC WITH DIFFERENTIAL/PLATELET
BASO%: 0.6 % (ref 0.0–2.0)
BASOS ABS: 0.1 10*3/uL (ref 0.0–0.1)
EOS ABS: 0.8 10*3/uL — AB (ref 0.0–0.5)
EOS%: 6.9 % (ref 0.0–7.0)
HCT: 36.7 % — ABNORMAL LOW (ref 38.4–49.9)
HEMOGLOBIN: 11.7 g/dL — AB (ref 13.0–17.1)
LYMPH%: 11.5 % — AB (ref 14.0–49.0)
MCH: 28.6 pg (ref 27.2–33.4)
MCHC: 31.8 g/dL — ABNORMAL LOW (ref 32.0–36.0)
MCV: 90.1 fL (ref 79.3–98.0)
MONO#: 1.1 10*3/uL — AB (ref 0.1–0.9)
MONO%: 9.9 % (ref 0.0–14.0)
NEUT%: 71.1 % (ref 39.0–75.0)
NEUTROS ABS: 7.9 10*3/uL — AB (ref 1.5–6.5)
PLATELETS: 326 10*3/uL (ref 140–400)
RBC: 4.07 10*6/uL — ABNORMAL LOW (ref 4.20–5.82)
RDW: 19.6 % — AB (ref 11.0–14.6)
WBC: 11.1 10*3/uL — AB (ref 4.0–10.3)
lymph#: 1.3 10*3/uL (ref 0.9–3.3)

## 2015-12-07 LAB — COMPREHENSIVE METABOLIC PANEL
ALBUMIN: 3.2 g/dL — AB (ref 3.5–5.0)
ALK PHOS: 138 U/L (ref 40–150)
ALT: 32 U/L (ref 0–55)
ANION GAP: 11 meq/L (ref 3–11)
AST: 23 U/L (ref 5–34)
BUN: 58.9 mg/dL — ABNORMAL HIGH (ref 7.0–26.0)
CALCIUM: 9 mg/dL (ref 8.4–10.4)
CO2: 14 mEq/L — ABNORMAL LOW (ref 22–29)
Chloride: 112 mEq/L — ABNORMAL HIGH (ref 98–109)
Creatinine: 2.4 mg/dL — ABNORMAL HIGH (ref 0.7–1.3)
EGFR: 34 mL/min/{1.73_m2} — AB (ref >90)
Glucose: 109 mg/dl (ref 70–140)
Potassium: 5.1 mEq/L (ref 3.5–5.1)
Sodium: 137 mEq/L (ref 136–145)
TOTAL PROTEIN: 8.1 g/dL (ref 6.4–8.3)

## 2015-12-07 MED ORDER — OXYCODONE-ACETAMINOPHEN 5-325 MG PO TABS: 1.0000 | ORAL_TABLET | ORAL | Status: DC | PRN

## 2015-12-07 MED ORDER — SODIUM CHLORIDE 0.9 % IJ SOLN
10.0000 mL | INTRAMUSCULAR | Status: DC | PRN
  Administered 2015-12-07: 10 mL
  Filled 2015-12-07: qty 10

## 2015-12-07 MED ORDER — SODIUM CHLORIDE 0.9 % IV SOLN
Freq: Once | INTRAVENOUS | Status: AC
  Administered 2015-12-07: 11:00:00 via INTRAVENOUS

## 2015-12-07 MED ORDER — SODIUM CHLORIDE 0.9 % IV SOLN
240.0000 mg | Freq: Once | INTRAVENOUS | Status: AC
  Administered 2015-12-07: 240 mg via INTRAVENOUS
  Filled 2015-12-07: qty 8

## 2015-12-07 MED ORDER — HEPARIN SOD (PORK) LOCK FLUSH 100 UNIT/ML IV SOLN
500.0000 [IU] | Freq: Once | INTRAVENOUS | Status: AC | PRN
  Administered 2015-12-07: 500 [IU]
  Filled 2015-12-07: qty 5

## 2015-12-07 NOTE — Progress Notes (Signed)
[place] [phone]   [phone]  OFFICE PROGRESS NOTE  [name], [name], MD [place] [address]  DIAGNOSIS:  1) Stage IV (T2b, N2, M1b) non-small cell lung cancer, adenocarcinoma with negative EGFR mutation and negative gene translocation presented with a large right hilar mass as well as mediastinal lymphadenopathy and metastatic disease to the liver, bone and pancreas diagnosed in February 2016. 2) prostate adenocarcinoma diagnosed at [facility] with Gleason score 9 (4+5).  PRIOR THERAPY: Systemic chemotherapy with carboplatin for AUC of 5 and Alimta 500 MG/M2 every 3 weeks. Status post 6 cycles.  CURRENT THERAPY:  1) Immunotherapy with Nivolumab 240 MG every 2 weeks, status post 16 cycles. 2) Lupron and Casodex for the recently diagnosed prostate adenocarcinoma at [facility]: Francisco Love 55 y.o. male returns to the clinic today for follow-up visit accompanied by his brother-in-law. The patient is tolerating his current treatment with immunotherapy with Nivolumab fairly well. He denied having any specific complaints except for dry skin. He had an episode of diarrhea earlier today after the oral contrast. He denied having any significant nausea or vomiting. The patient denied having any fever or chills, no significant weight loss or night sweats. He has no chest pain, shortness of breath but has mild cough with no hemoptysis. He had repeat CT scan of the chest, abdomen and pelvis performed earlier today and he is here for evaluation and discussion of his scan results.  MEDICAL HISTORY: Past Medical History  Diagnosis Date  . Shortness of breath dyspnea     ALLERGIES:  has No Known Allergies.  MEDICATIONS:  Current Outpatient Prescriptions  Medication Sig Dispense Refill  . FLUOCINOLONE ACETONIDE BODY 0.01 % OIL     . levocetirizine (XYZAL) 5 MG tablet Take 5 mg by mouth.    Marland Kitchen  acetaminophen (TYLENOL) 325 MG tablet Take 2 tablets (650 mg total) by mouth every 6 (six) hours as needed for mild pain (or Fever >/= 101).    Marland Kitchen albuterol (PROVENTIL) (2.5 MG/3ML) 0.083% nebulizer solution Take 3 mLs (2.5 mg total) by nebulization every 2 (two) hours as needed for wheezing. 75 mL 12  . AMITIZA 24 MCG capsule     . bicalutamide (CASODEX) 50 MG tablet Take 50 mg by mouth.    . clobetasol ointment (TEMOVATE) 0.05 % Apply topically.    . diphenhydrAMINE (BENADRYL) 25 MG tablet Take 25 mg by mouth.    . diphenhydrAMINE (SOMINEX) 25 MG tablet Take 25 mg by mouth as needed for itching or sleep.    Marland Kitchen doxycycline (ADOXA) 100 MG tablet     . ENSURE PLUS (ENSURE PLUS) LIQD Take by mouth.    . feeding supplement, ENSURE COMPLETE, (ENSURE COMPLETE) LIQD Take 237 mLs by mouth 3 (three) times daily between meals.    Marland Kitchen FLUOCINOLONE ACETONIDE BODY 0.01 % OIL     . levocetirizine (XYZAL) 5 MG tablet     . levofloxacin (LEVAQUIN) 500 MG tablet     . lidocaine-prilocaine (EMLA) cream Apply 1 application topically as needed. Apply small amount  to port site 1.5 hours prior to chemotherapy. Do not rub in cream . Cover with cling wrap 30 g 0  . mirtazapine (REMERON) 15 MG tablet Take 1 tablet (15 mg total) by mouth at bedtime.    Marland Kitchen oxyCODONE-acetaminophen (PERCOCET/ROXICET) 5-325 MG tablet Take 1 tablet by mouth every 4 (four) hours as needed for  severe pain. 60 tablet 0  . polyethylene glycol (MIRALAX / GLYCOLAX) packet Take 17 g by mouth daily.    . prochlorperazine (COMPAZINE) 10 MG tablet Take 1 tablet (10 mg total) by mouth every 6 (six) hours as needed for nausea or vomiting. 30 tablet 1  . senna-docusate (SENOKOT-S) 8.6-50 MG per tablet Take 1 tablet by mouth daily.    . tamsulosin (FLOMAX) 0.4 MG CAPS capsule     . valACYclovir (VALTREX) 1000 MG tablet      No current facility-administered medications for this visit.    SURGICAL HISTORY:  Past Surgical History  Procedure Laterality Date   . Appendectomy    . Video bronchoscopy N/A 09/21/2014    Procedure: VIDEO BRONCHOSCOPY WITH FLUORO;  Surgeon: Rigoberto Noel, MD;  Location: [place];  Service: Cardiopulmonary;  Laterality: N/A;  . Video bronchoscopy with endobronchial ultrasound N/A 09/23/2014    Procedure: VIDEO BRONCHOSCOPY WITH ENDOBRONCHIAL ULTRASOUND;  Surgeon: Rigoberto Noel, MD;  Location: [place];  Service: Pulmonary;  Laterality: N/A;    REVIEW OF SYSTEMS:  Constitutional: positive for fatigue Eyes: negative Ears, nose, mouth, throat, and face: negative Respiratory: negative Cardiovascular: negative Gastrointestinal: positive for diarrhea Genitourinary:negative Integument/breast: negative Hematologic/lymphatic: negative Musculoskeletal:negative Neurological: negative Behavioral/Psych: negative Endocrine: negative Allergic/Immunologic: negative   PHYSICAL EXAMINATION: General appearance: alert, cooperative, fatigued and no distress Head: Normocephalic, without obvious abnormality, atraumatic Neck: no adenopathy, no JVD, supple, symmetrical, trachea midline and thyroid not enlarged, symmetric, no tenderness/mass/nodules Lymph nodes: Cervical, supraclavicular, and axillary nodes normal. Resp: clear to auscultation bilaterally Back: symmetric, no curvature. ROM normal. No CVA tenderness. Cardio: regular rate and rhythm, S1, S2 normal, no murmur, click, rub or gallop GI: soft, non-tender; bowel sounds normal; no masses,  no organomegaly Extremities: extremities normal, atraumatic, no cyanosis or edema Neurologic: Alert and oriented X 3, normal strength and tone. Normal symmetric reflexes. Normal coordination and gait  ECOG PERFORMANCE STATUS: 2 - Symptomatic, <50% confined to bed  Blood pressure 122/86, pulse 113, temperature 97.6 F (36.4 C), temperature source Oral, resp. rate 19, height 5' 9"  (1.753 m), weight 141 lb 6.4 oz (64.139 kg), SpO2 100 %.  LABORATORY DATA: Lab Results  Component Value Date    WBC 11.1* 12/07/2015   HGB 11.7* 12/07/2015   HCT 36.7* 12/07/2015   MCV 90.1 12/07/2015   PLT 326 12/07/2015      Chemistry      Component Value Date/Time   NA 139 11/24/2015 0902   NA 135 10/25/2014 0845   K 5.0 11/24/2015 0902   K 5.3* 10/25/2014 0845   CL 108 10/25/2014 0845   CO2 15* 11/24/2015 0902   CO2 19 10/25/2014 0845   BUN 45.6* 11/24/2015 0902   BUN 22 10/25/2014 0845   CREATININE 3.1* 11/24/2015 0902   CREATININE 1.03 10/25/2014 0845      Component Value Date/Time   CALCIUM 9.2 11/24/2015 0902   CALCIUM 8.6 10/25/2014 0845   ALKPHOS 145 11/24/2015 0902   ALKPHOS 882* 09/20/2014 1120   AST 29 11/24/2015 0902   AST 29 09/20/2014 1120   ALT 28 11/24/2015 0902   ALT 34 09/20/2014 1120   BILITOT <0.30 11/24/2015 0902   BILITOT 0.1* 09/20/2014 1120       RADIOGRAPHIC STUDIES: Ct Abdomen Pelvis Wo Contrast  12/07/2015  CLINICAL DATA:  Non-small cell lung cancer diagnosed February 2016. Chemotherapy in progress. Bone metastasis. Additional history of prostate cancer. EXAM: CT CHEST, ABDOMEN AND PELVIS WITHOUT CONTRAST TECHNIQUE: Multidetector CT imaging  of the chest, abdomen and pelvis was performed following the standard protocol without IV contrast. COMPARISON:  CT 09/28/2015, PET-CT 10/15/2014. FINDINGS: CT CHEST FINDINGS Mediastinum/Nodes: No axillary adenopathy. Port in the RIGHT chest wall. No supraclavicular lymphadenopathy. Partially calcified enlarged paratracheal lymph nodes are again demonstrated. RIGHT lower paratracheal lymph node measures 2.5 cm short axis compared to 2.7 cm. Subcarinal lymph node measures 12 mm short axis compared to 9 mm. Partially calcified RIGHT hilar lymph nodes are similar. Prevascular lymph nodes measuring 10 mm unchanged. No pericardial fluid. Esophagus normal. Lungs/Pleura: Biapical nodularity and scarring is unchanged appears chronic. Calcified granuloma in the LEFT upper lobe on image 64 series 5 is unchanged. Small bilateral  pleural effusions noted. There is mild ground-glass opacities throughout the lungs suggesting mild edema. Musculoskeletal: Widespread skeletal metastasis within the ribs and spine CT ABDOMEN AND PELVIS FINDINGS Hepatobiliary: No focal hepatic lesion on this noncontrast exam. Normal gallbladder Pancreas: Chronic calcifications the pancreatic head. No pancreatic duct dilatation. No change from prior. Spleen: Normal spleen Adrenals/urinary tract: Adrenal glands and kidneys are normal. The ureters and bladder normal. Stomach/Bowel: Stomach, small bowel, appendix, and cecum are normal. The colon and rectosigmoid colon are normal. Vascular/Lymphatic: Abdominal aorta is normal caliber with atherosclerotic calcification. There is no retroperitoneal or periportal lymphadenopathy. No pelvic lymphadenopathy. Reproductive: Prostate small. Bilateral hydroceles partially imaged within the scrotum. No change from 10/15/2014, PET-CT scan. Other: No peritoneal disease Musculoskeletal: Widespread sclerotic osseous metastasis within the pelvis, spine and ribs not changed from prior. Severe degenerate change of the femoral heads. IMPRESSION: Chest Impression: 1. Stable enlarged partially calcified paratracheal and hilar lymphadenopathy. 2. Bilateral pleural effusions and mild edema. 3. Stable benign-appearing pulmonary nodules. Abdomen / Pelvis Impression: 1. No evidence of metastatic disease in soft tissues of the abdomen pelvis. 2. Widespread sclerotic osseous metastasis not changed from prior. 3. Severe degenerative change of bilateral hip joints . 4. Chronic bilateral scrotal hydroceles partially imaged. Electronically Signed   By: Suzy Bouchard M.D.   On: 12/07/2015 08:52   Ct Chest Wo Contrast  12/07/2015  CLINICAL DATA:  Non-small cell lung cancer diagnosed February 2016. Chemotherapy in progress. Bone metastasis. Additional history of prostate cancer. EXAM: CT CHEST, ABDOMEN AND PELVIS WITHOUT CONTRAST TECHNIQUE:  Multidetector CT imaging of the chest, abdomen and pelvis was performed following the standard protocol without IV contrast. COMPARISON:  CT 09/28/2015, PET-CT 10/15/2014. FINDINGS: CT CHEST FINDINGS Mediastinum/Nodes: No axillary adenopathy. Port in the RIGHT chest wall. No supraclavicular lymphadenopathy. Partially calcified enlarged paratracheal lymph nodes are again demonstrated. RIGHT lower paratracheal lymph node measures 2.5 cm short axis compared to 2.7 cm. Subcarinal lymph node measures 12 mm short axis compared to 9 mm. Partially calcified RIGHT hilar lymph nodes are similar. Prevascular lymph nodes measuring 10 mm unchanged. No pericardial fluid. Esophagus normal. Lungs/Pleura: Biapical nodularity and scarring is unchanged appears chronic. Calcified granuloma in the LEFT upper lobe on image 64 series 5 is unchanged. Small bilateral pleural effusions noted. There is mild ground-glass opacities throughout the lungs suggesting mild edema. Musculoskeletal: Widespread skeletal metastasis within the ribs and spine CT ABDOMEN AND PELVIS FINDINGS Hepatobiliary: No focal hepatic lesion on this noncontrast exam. Normal gallbladder Pancreas: Chronic calcifications the pancreatic head. No pancreatic duct dilatation. No change from prior. Spleen: Normal spleen Adrenals/urinary tract: Adrenal glands and kidneys are normal. The ureters and bladder normal. Stomach/Bowel: Stomach, small bowel, appendix, and cecum are normal. The colon and rectosigmoid colon are normal. Vascular/Lymphatic: Abdominal aorta is normal caliber with atherosclerotic calcification. There  is no retroperitoneal or periportal lymphadenopathy. No pelvic lymphadenopathy. Reproductive: Prostate small. Bilateral hydroceles partially imaged within the scrotum. No change from 10/15/2014, PET-CT scan. Other: No peritoneal disease Musculoskeletal: Widespread sclerotic osseous metastasis within the pelvis, spine and ribs not changed from prior. Severe  degenerate change of the femoral heads. IMPRESSION: Chest Impression: 1. Stable enlarged partially calcified paratracheal and hilar lymphadenopathy. 2. Bilateral pleural effusions and mild edema. 3. Stable benign-appearing pulmonary nodules. Abdomen / Pelvis Impression: 1. No evidence of metastatic disease in soft tissues of the abdomen pelvis. 2. Widespread sclerotic osseous metastasis not changed from prior. 3. Severe degenerative change of bilateral hip joints . 4. Chronic bilateral scrotal hydroceles partially imaged. Electronically Signed   By: Suzy Bouchard M.D.   On: 12/07/2015 08:52   ASSESSMENT AND PLAN: This is a very pleasant 55 years old African-American male with: 1)  metastatic non-small cell lung cancer, adenocarcinoma status post induction chemotherapy with carboplatin and Alimta with initial good response followed by disease progression. The patient is currently on second line treatment with immunotherapy with Nivolumab status post 16 cycles. He tolerated the last cycle of his treatment fairly well with no concerning adverse effects except for dry skin.  The recent CT scan of the chest, abdomen and pelvis showed stable disease. I discussed the scan results with the patient today. I recommended for him to continue his treatment with immunotherapy as scheduled. We will proceed with cycle #17 today. 2) recently diagnosed prostate adenocarcinoma: He is followed at [facility] for this problem. He is currently on hormonal therapy with Lupron and Casodex. 3) pain management, he would continue on Percocet as needed. He was given a refill of this medication today. 4) metastatic bone disease, I discussed with the patient consideration of treatment with Xgeva after receive dental clearance. He is still reluctant about new treatment. The patient would come back for follow-up visit in 2 weeks for reevaluation before starting cycle #18. He was advised to call immediately if he has any concerning  symptoms in the interval. The patient voices understanding of current disease status and treatment options and is in agreement with the current care plan.  All questions were answered. The patient knows to call the clinic with any problems, questions or concerns. We can certainly see the patient much sooner if necessary.  Disclaimer: This note was dictated with voice recognition software. Similar sounding words can inadvertently be transcribed and may not be corrected upon review.

## 2015-12-07 NOTE — Patient Instructions (Signed)
[facility] Discharge Instructions for Patients Receiving Chemotherapy  Today you received the following chemotherapy agents Opdivo.  To help prevent nausea and vomiting after your treatment, we encourage you to take your nausea medication as directed.   If you develop nausea and vomiting that is not controlled by your nausea medication, call the clinic.   BELOW ARE SYMPTOMS THAT SHOULD BE REPORTED IMMEDIATELY:  *FEVER GREATER THAN 100.5 F  *CHILLS WITH OR WITHOUT FEVER  NAUSEA AND VOMITING THAT IS NOT CONTROLLED WITH YOUR NAUSEA MEDICATION  *UNUSUAL SHORTNESS OF BREATH  *UNUSUAL BRUISING OR BLEEDING  TENDERNESS IN MOUTH AND THROAT WITH OR WITHOUT PRESENCE OF ULCERS  *URINARY PROBLEMS  *BOWEL PROBLEMS  UNUSUAL RASH Items with * indicate a potential emergency and should be followed up as soon as possible.  Feel free to call the clinic you have any questions or concerns. The clinic phone number is [phone].  Please show the CHEMO ALERT CARD at check-in to the Emergency Department and triage nurse.    

## 2015-12-07 NOTE — Telephone Encounter (Signed)
per pof to sch pt appt-appts already sch

## 2015-12-07 NOTE — Progress Notes (Signed)
OK to treat with CREAT 2.4 today per MD [facility]

## 2015-12-08 ENCOUNTER — Ambulatory Visit: Payer: Medicare Other | Admitting: Internal Medicine

## 2015-12-08 ENCOUNTER — Ambulatory Visit: Payer: Medicare Other

## 2015-12-08 ENCOUNTER — Other Ambulatory Visit: Payer: Medicare Other

## 2015-12-20 ENCOUNTER — Other Ambulatory Visit: Payer: Self-pay

## 2015-12-21 ENCOUNTER — Telehealth: Payer: Self-pay | Admitting: Internal Medicine

## 2015-12-21 ENCOUNTER — Ambulatory Visit (HOSPITAL_BASED_OUTPATIENT_CLINIC_OR_DEPARTMENT_OTHER): Payer: Medicare Other | Admitting: Internal Medicine

## 2015-12-21 ENCOUNTER — Other Ambulatory Visit (HOSPITAL_BASED_OUTPATIENT_CLINIC_OR_DEPARTMENT_OTHER): Payer: Medicare Other

## 2015-12-21 ENCOUNTER — Encounter: Payer: Self-pay | Admitting: Internal Medicine

## 2015-12-21 ENCOUNTER — Ambulatory Visit (HOSPITAL_BASED_OUTPATIENT_CLINIC_OR_DEPARTMENT_OTHER): Payer: Medicare Other

## 2015-12-21 VITALS — BP 122/87 | HR 110 | Temp 97.5°F | Resp 19 | Ht 69.0 in | Wt 137.8 lb

## 2015-12-21 DIAGNOSIS — C7951 Secondary malignant neoplasm of bone: Secondary | ICD-10-CM

## 2015-12-21 DIAGNOSIS — C3491 Malignant neoplasm of unspecified part of right bronchus or lung: Secondary | ICD-10-CM

## 2015-12-21 DIAGNOSIS — C3401 Malignant neoplasm of right main bronchus: Secondary | ICD-10-CM

## 2015-12-21 DIAGNOSIS — Z5112 Encounter for antineoplastic immunotherapy: Secondary | ICD-10-CM | POA: Diagnosis present

## 2015-12-21 DIAGNOSIS — C61 Malignant neoplasm of prostate: Secondary | ICD-10-CM

## 2015-12-21 DIAGNOSIS — C787 Secondary malignant neoplasm of liver and intrahepatic bile duct: Secondary | ICD-10-CM

## 2015-12-21 DIAGNOSIS — C801 Malignant (primary) neoplasm, unspecified: Principal | ICD-10-CM

## 2015-12-21 DIAGNOSIS — G893 Neoplasm related pain (acute) (chronic): Secondary | ICD-10-CM

## 2015-12-21 DIAGNOSIS — C7889 Secondary malignant neoplasm of other digestive organs: Secondary | ICD-10-CM

## 2015-12-21 LAB — CBC WITH DIFFERENTIAL/PLATELET
BASO%: 0.3 % (ref 0.0–2.0)
BASOS ABS: 0 10*3/uL (ref 0.0–0.1)
EOS%: 3.4 % (ref 0.0–7.0)
Eosinophils Absolute: 0.3 10*3/uL (ref 0.0–0.5)
HEMATOCRIT: 36.9 % — AB (ref 38.4–49.9)
HEMOGLOBIN: 12.3 g/dL — AB (ref 13.0–17.1)
LYMPH#: 1.9 10*3/uL (ref 0.9–3.3)
LYMPH%: 19 % (ref 14.0–49.0)
MCH: 29.9 pg (ref 27.2–33.4)
MCHC: 33.3 g/dL (ref 32.0–36.0)
MCV: 89.6 fL (ref 79.3–98.0)
MONO#: 0.5 10*3/uL (ref 0.1–0.9)
MONO%: 5 % (ref 0.0–14.0)
NEUT%: 72.3 % (ref 39.0–75.0)
NEUTROS ABS: 7.1 10*3/uL — AB (ref 1.5–6.5)
Platelets: 308 10*3/uL (ref 140–400)
RBC: 4.12 10*6/uL — AB (ref 4.20–5.82)
RDW: 18.3 % — AB (ref 11.0–14.6)
WBC: 9.8 10*3/uL (ref 4.0–10.3)

## 2015-12-21 LAB — COMPREHENSIVE METABOLIC PANEL
ALT: 22 U/L (ref 0–55)
ANION GAP: 12 meq/L — AB (ref 3–11)
AST: 18 U/L (ref 5–34)
Albumin: 3.5 g/dL (ref 3.5–5.0)
Alkaline Phosphatase: 147 U/L (ref 40–150)
BILIRUBIN TOTAL: 0.31 mg/dL (ref 0.20–1.20)
BUN: 35.9 mg/dL — ABNORMAL HIGH (ref 7.0–26.0)
CHLORIDE: 111 meq/L — AB (ref 98–109)
CO2: 15 meq/L — AB (ref 22–29)
Calcium: 9.8 mg/dL (ref 8.4–10.4)
Creatinine: 2.3 mg/dL — ABNORMAL HIGH (ref 0.7–1.3)
EGFR: 35 mL/min/{1.73_m2} — AB (ref >90)
Glucose: 101 mg/dl (ref 70–140)
Potassium: 4.9 mEq/L (ref 3.5–5.1)
Sodium: 138 mEq/L (ref 136–145)
Total Protein: 8.7 g/dL — ABNORMAL HIGH (ref 6.4–8.3)

## 2015-12-21 MED ORDER — SODIUM CHLORIDE 0.9 % IJ SOLN
10.0000 mL | INTRAMUSCULAR | Status: DC | PRN
  Administered 2015-12-21: 10 mL
  Filled 2015-12-21: qty 10

## 2015-12-21 MED ORDER — SODIUM CHLORIDE 0.9 % IV SOLN
Freq: Once | INTRAVENOUS | Status: AC
  Administered 2015-12-21: 10:00:00 via INTRAVENOUS

## 2015-12-21 MED ORDER — SODIUM CHLORIDE 0.9 % IV SOLN
240.0000 mg | Freq: Once | INTRAVENOUS | Status: AC
  Administered 2015-12-21: 240 mg via INTRAVENOUS
  Filled 2015-12-21: qty 8

## 2015-12-21 MED ORDER — OXYCODONE-ACETAMINOPHEN 5-325 MG PO TABS: 1.0000 | ORAL_TABLET | ORAL | Status: DC | PRN

## 2015-12-21 MED ORDER — HEPARIN SOD (PORK) LOCK FLUSH 100 UNIT/ML IV SOLN
500.0000 [IU] | Freq: Once | INTRAVENOUS | Status: AC | PRN
  Administered 2015-12-21: 500 [IU]
  Filled 2015-12-21: qty 5

## 2015-12-21 NOTE — Patient Instructions (Signed)
[facility] Discharge Instructions for Patients Receiving Chemotherapy  Today you received the following chemotherapy agents:  Nivolumab.  To help prevent nausea and vomiting after your treatment, we encourage you to take your nausea medication as directed.   If you develop nausea and vomiting that is not controlled by your nausea medication, call the clinic.   BELOW ARE SYMPTOMS THAT SHOULD BE REPORTED IMMEDIATELY:  *FEVER GREATER THAN 100.5 F  *CHILLS WITH OR WITHOUT FEVER  NAUSEA AND VOMITING THAT IS NOT CONTROLLED WITH YOUR NAUSEA MEDICATION  *UNUSUAL SHORTNESS OF BREATH  *UNUSUAL BRUISING OR BLEEDING  TENDERNESS IN MOUTH AND THROAT WITH OR WITHOUT PRESENCE OF ULCERS  *URINARY PROBLEMS  *BOWEL PROBLEMS  UNUSUAL RASH Items with * indicate a potential emergency and should be followed up as soon as possible.  Feel free to call the clinic you have any questions or concerns. The clinic phone number is [phone].  Please show the CHEMO ALERT CARD at check-in to the Emergency Department and triage nurse.   

## 2015-12-21 NOTE — Progress Notes (Signed)
Okay to treat with elevated creatinine of 2.3 per Dr. Julien Nordmann.

## 2015-12-21 NOTE — Progress Notes (Signed)
[place] [phone]   [phone]  OFFICE PROGRESS NOTE  Francisco Love, [name], MD [place] [address]  DIAGNOSIS:  1) Stage IV (T2b, N2, M1b) non-small cell lung cancer, adenocarcinoma with negative EGFR mutation and negative gene translocation presented with a large right hilar mass as well as mediastinal lymphadenopathy and metastatic disease to the liver, bone and pancreas diagnosed in February 2016. 2) prostate adenocarcinoma diagnosed at [facility] with Gleason score 9 (4+5).  PRIOR THERAPY: Systemic chemotherapy with carboplatin for AUC of 5 and Alimta 500 MG/M2 every 3 weeks. Status post 6 cycles.  CURRENT THERAPY:  1) Immunotherapy with Nivolumab 240 MG every 2 weeks, status post 17 cycles. 2) Lupron and Casodex for the recently diagnosed prostate adenocarcinoma at [place]: Francisco Love 55 y.o. male returns to the clinic today for follow-up visit accompanied by his brother-in-law. The patient continues to tolerate his current treatment with immunotherapy with Nivolumab fairly well. He denied having any specific complaints except for dry skin. He also has decubitus ulcer on the lower back and currently managed by his primary care physician. He denied having any significant nausea or vomiting. The patient denied having any fever or chills, no significant weight loss or night sweats. He has no chest pain, shortness of breath but has mild cough with no hemoptysis. He is here today to start cycle #18 of his treatment with immunotherapy.  MEDICAL HISTORY: Past Medical History  Diagnosis Date  . Shortness of breath dyspnea     ALLERGIES:  has No Known Allergies.  MEDICATIONS:  Current Outpatient Prescriptions  Medication Sig Dispense Refill  . acetaminophen (TYLENOL) 325 MG tablet Take 2 tablets (650 mg total) by mouth every 6 (six) hours as needed for mild pain (or Fever >/= 101).    Marland Kitchen  albuterol (PROVENTIL) (2.5 MG/3ML) 0.083% nebulizer solution Take 3 mLs (2.5 mg total) by nebulization every 2 (two) hours as needed for wheezing. 75 mL 12  . AMITIZA 24 MCG capsule     . bicalutamide (CASODEX) 50 MG tablet Take 50 mg by mouth.    . clobetasol ointment (TEMOVATE) 0.05 % Apply topically.    . diphenhydrAMINE (BENADRYL) 25 MG tablet Take 25 mg by mouth.    . diphenhydrAMINE (SOMINEX) 25 MG tablet Take 25 mg by mouth as needed for itching or sleep.    Marland Kitchen doxycycline (ADOXA) 100 MG tablet     . ENSURE PLUS (ENSURE PLUS) LIQD Take by mouth.    . feeding supplement, ENSURE COMPLETE, (ENSURE COMPLETE) LIQD Take 237 mLs by mouth 3 (three) times daily between meals.    Marland Kitchen FLUOCINOLONE ACETONIDE BODY 0.01 % OIL     . FLUOCINOLONE ACETONIDE BODY 0.01 % OIL     . levocetirizine (XYZAL) 5 MG tablet Take 5 mg by mouth.    . levocetirizine (XYZAL) 5 MG tablet     . levofloxacin (LEVAQUIN) 500 MG tablet     . lidocaine-prilocaine (EMLA) cream Apply 1 application topically as needed. Apply small amount  to port site 1.5 hours prior to chemotherapy. Do not rub in cream . Cover with cling wrap 30 g 0  . mirtazapine (REMERON) 15 MG tablet Take 1 tablet (15 mg total) by mouth at bedtime.    Marland Kitchen oxyCODONE-acetaminophen (PERCOCET/ROXICET) 5-325 MG tablet Take 1 tablet by mouth every 4 (four) hours as needed for severe pain. 60 tablet 0  .  oxyCODONE-acetaminophen (PERCOCET/ROXICET) 5-325 MG tablet Take 1 tablet by mouth every 4 (four) hours as needed for severe pain. 60 tablet 0  . polyethylene glycol (MIRALAX / GLYCOLAX) packet Take 17 g by mouth daily.    . prochlorperazine (COMPAZINE) 10 MG tablet Take 1 tablet (10 mg total) by mouth every 6 (six) hours as needed for nausea or vomiting. 30 tablet 1  . senna-docusate (SENOKOT-S) 8.6-50 MG per tablet Take 1 tablet by mouth daily.    . tamsulosin (FLOMAX) 0.4 MG CAPS capsule     . valACYclovir (VALTREX) 1000 MG tablet      No current  facility-administered medications for this visit.    SURGICAL HISTORY:  Past Surgical History  Procedure Laterality Date  . Appendectomy    . Video bronchoscopy N/A 09/21/2014    Procedure: VIDEO BRONCHOSCOPY WITH FLUORO;  Surgeon: Rigoberto Noel, MD;  Location: [facility];  Service: Cardiopulmonary;  Laterality: N/A;  . Video bronchoscopy with endobronchial ultrasound N/A 09/23/2014    Procedure: VIDEO BRONCHOSCOPY WITH ENDOBRONCHIAL ULTRASOUND;  Surgeon: Rigoberto Noel, MD;  Location: [facility];  Service: Pulmonary;  Laterality: N/A;    REVIEW OF SYSTEMS:  A comprehensive review of systems was negative except for: Constitutional: positive for fatigue Integument/breast: positive for dryness and pruritus   PHYSICAL EXAMINATION: General appearance: alert, cooperative, fatigued and no distress Head: Normocephalic, without obvious abnormality, atraumatic Neck: no adenopathy, no JVD, supple, symmetrical, trachea midline and thyroid not enlarged, symmetric, no tenderness/mass/nodules Lymph nodes: Cervical, supraclavicular, and axillary nodes normal. Resp: clear to auscultation bilaterally Back: symmetric, no curvature. ROM normal. No CVA tenderness. Cardio: regular rate and rhythm, S1, S2 normal, no murmur, click, rub or gallop GI: soft, non-tender; bowel sounds normal; no masses,  no organomegaly Extremities: extremities normal, atraumatic, no cyanosis or edema Neurologic: Alert and oriented X 3, normal strength and tone. Normal symmetric reflexes. Normal coordination and gait  ECOG PERFORMANCE STATUS: 2 - Symptomatic, <50% confined to bed  Blood pressure 122/87, pulse 110, temperature 97.5 F (36.4 C), temperature source Oral, resp. rate 19, height _0  (1.753 m), weight 137 lb 12.8 oz (62.506 kg), SpO2 100 %.  LABORATORY DATA: Lab Results  Component Value Date   WBC 9.8 12/21/2015   HGB 12.3* 12/21/2015   HCT 36.9* 12/21/2015   MCV 89.6 12/21/2015   PLT 308 12/21/2015      Chemistry       Component Value Date/Time   NA 137 12/07/2015 0940   NA 135 10/25/2014 0845   K 5.1 12/07/2015 0940   K 5.3* 10/25/2014 0845   CL 108 10/25/2014 0845   CO2 14* 12/07/2015 0940   CO2 19 10/25/2014 0845   BUN 58.9* 12/07/2015 0940   BUN 22 10/25/2014 0845   CREATININE 2.4* 12/07/2015 0940   CREATININE 1.03 10/25/2014 0845      Component Value Date/Time   CALCIUM 9.0 12/07/2015 0940   CALCIUM 8.6 10/25/2014 0845   ALKPHOS 138 12/07/2015 0940   ALKPHOS 882* 09/20/2014 1120   AST 23 12/07/2015 0940   AST 29 09/20/2014 1120   ALT 32 12/07/2015 0940   ALT 34 09/20/2014 1120   BILITOT <0.30 12/07/2015 0940   BILITOT 0.1* 09/20/2014 1120       RADIOGRAPHIC STUDIES: Ct Abdomen Pelvis Wo Contrast  12/07/2015  CLINICAL DATA:  Non-small cell lung cancer diagnosed February 2016. Chemotherapy in progress. Bone metastasis. Additional history of prostate cancer. EXAM: CT CHEST, ABDOMEN AND PELVIS WITHOUT CONTRAST TECHNIQUE: Multidetector CT  imaging of the chest, abdomen and pelvis was performed following the standard protocol without IV contrast. COMPARISON:  CT 09/28/2015, PET-CT 10/15/2014. FINDINGS: CT CHEST FINDINGS Mediastinum/Nodes: No axillary adenopathy. Port in the RIGHT chest wall. No supraclavicular lymphadenopathy. Partially calcified enlarged paratracheal lymph nodes are again demonstrated. RIGHT lower paratracheal lymph node measures 2.5 cm short axis compared to 2.7 cm. Subcarinal lymph node measures 12 mm short axis compared to 9 mm. Partially calcified RIGHT hilar lymph nodes are similar. Prevascular lymph nodes measuring 10 mm unchanged. No pericardial fluid. Esophagus normal. Lungs/Pleura: Biapical nodularity and scarring is unchanged appears chronic. Calcified granuloma in the LEFT upper lobe on image 64 series 5 is unchanged. Small bilateral pleural effusions noted. There is mild ground-glass opacities throughout the lungs suggesting mild edema. Musculoskeletal: Widespread  skeletal metastasis within the ribs and spine CT ABDOMEN AND PELVIS FINDINGS Hepatobiliary: No focal hepatic lesion on this noncontrast exam. Normal gallbladder Pancreas: Chronic calcifications the pancreatic head. No pancreatic duct dilatation. No change from prior. Spleen: Normal spleen Adrenals/urinary tract: Adrenal glands and kidneys are normal. The ureters and bladder normal. Stomach/Bowel: Stomach, small bowel, appendix, and cecum are normal. The colon and rectosigmoid colon are normal. Vascular/Lymphatic: Abdominal aorta is normal caliber with atherosclerotic calcification. There is no retroperitoneal or periportal lymphadenopathy. No pelvic lymphadenopathy. Reproductive: Prostate small. Bilateral hydroceles partially imaged within the scrotum. No change from 10/15/2014, PET-CT scan. Other: No peritoneal disease Musculoskeletal: Widespread sclerotic osseous metastasis within the pelvis, spine and ribs not changed from prior. Severe degenerate change of the femoral heads. IMPRESSION: Chest Impression: 1. Stable enlarged partially calcified paratracheal and hilar lymphadenopathy. 2. Bilateral pleural effusions and mild edema. 3. Stable benign-appearing pulmonary nodules. Abdomen / Pelvis Impression: 1. No evidence of metastatic disease in soft tissues of the abdomen pelvis. 2. Widespread sclerotic osseous metastasis not changed from prior. 3. Severe degenerative change of bilateral hip joints . 4. Chronic bilateral scrotal hydroceles partially imaged. Electronically Signed   By: Suzy Bouchard M.D.   On: 12/07/2015 08:52   Ct Chest Wo Contrast  12/07/2015  CLINICAL DATA:  Non-small cell lung cancer diagnosed February 2016. Chemotherapy in progress. Bone metastasis. Additional history of prostate cancer. EXAM: CT CHEST, ABDOMEN AND PELVIS WITHOUT CONTRAST TECHNIQUE: Multidetector CT imaging of the chest, abdomen and pelvis was performed following the standard protocol without IV contrast. COMPARISON:  CT  09/28/2015, PET-CT 10/15/2014. FINDINGS: CT CHEST FINDINGS Mediastinum/Nodes: No axillary adenopathy. Port in the RIGHT chest wall. No supraclavicular lymphadenopathy. Partially calcified enlarged paratracheal lymph nodes are again demonstrated. RIGHT lower paratracheal lymph node measures 2.5 cm short axis compared to 2.7 cm. Subcarinal lymph node measures 12 mm short axis compared to 9 mm. Partially calcified RIGHT hilar lymph nodes are similar. Prevascular lymph nodes measuring 10 mm unchanged. No pericardial fluid. Esophagus normal. Lungs/Pleura: Biapical nodularity and scarring is unchanged appears chronic. Calcified granuloma in the LEFT upper lobe on image 64 series 5 is unchanged. Small bilateral pleural effusions noted. There is mild ground-glass opacities throughout the lungs suggesting mild edema. Musculoskeletal: Widespread skeletal metastasis within the ribs and spine CT ABDOMEN AND PELVIS FINDINGS Hepatobiliary: No focal hepatic lesion on this noncontrast exam. Normal gallbladder Pancreas: Chronic calcifications the pancreatic head. No pancreatic duct dilatation. No change from prior. Spleen: Normal spleen Adrenals/urinary tract: Adrenal glands and kidneys are normal. The ureters and bladder normal. Stomach/Bowel: Stomach, small bowel, appendix, and cecum are normal. The colon and rectosigmoid colon are normal. Vascular/Lymphatic: Abdominal aorta is normal caliber with atherosclerotic calcification.  There is no retroperitoneal or periportal lymphadenopathy. No pelvic lymphadenopathy. Reproductive: Prostate small. Bilateral hydroceles partially imaged within the scrotum. No change from 10/15/2014, PET-CT scan. Other: No peritoneal disease Musculoskeletal: Widespread sclerotic osseous metastasis within the pelvis, spine and ribs not changed from prior. Severe degenerate change of the femoral heads. IMPRESSION: Chest Impression: 1. Stable enlarged partially calcified paratracheal and hilar  lymphadenopathy. 2. Bilateral pleural effusions and mild edema. 3. Stable benign-appearing pulmonary nodules. Abdomen / Pelvis Impression: 1. No evidence of metastatic disease in soft tissues of the abdomen pelvis. 2. Widespread sclerotic osseous metastasis not changed from prior. 3. Severe degenerative change of bilateral hip joints . 4. Chronic bilateral scrotal hydroceles partially imaged. Electronically Signed   By: Suzy Bouchard M.D.   On: 12/07/2015 08:52   ASSESSMENT AND PLAN: This is a very pleasant 55 years old African-American male with: 1)  metastatic non-small cell lung cancer, adenocarcinoma status post induction chemotherapy with carboplatin and Alimta with initial good response followed by disease progression. The patient is currently on second line treatment with immunotherapy with Nivolumab status post 17 cycles. He tolerated the last cycle of his treatment fairly well with no concerning adverse effects except for dry skin and pruritus.  I recommended for him to continue his treatment with immunotherapy with cycle #18 today as scheduled.  2) recently diagnosed prostate adenocarcinoma: He is followed at [facility] for this problem. He is currently on hormonal therapy with Lupron and Casodex.  3) pain management, he would continue on Percocet as needed. He was given a refill of this medication today.  4) metastatic bone disease, I discussed with the patient consideration of treatment with Xgeva after receive dental clearance. He is still reluctant about new treatment. The patient would come back for follow-up visit in 2 weeks for reevaluation before starting cycle #19. He was advised to call immediately if he has any concerning symptoms in the interval. The patient voices understanding of current disease status and treatment options and is in agreement with the current care plan.  All questions were answered. The patient knows to call the clinic with any problems, questions or  concerns. We can certainly see the patient much sooner if necessary.  Disclaimer: This note was dictated with voice recognition software. Similar sounding words can inadvertently be transcribed and may not be corrected upon review.

## 2015-12-21 NOTE — Telephone Encounter (Signed)
per pof to sch pt appt-pt appt already sch-gave avs

## 2016-01-04 ENCOUNTER — Ambulatory Visit (HOSPITAL_BASED_OUTPATIENT_CLINIC_OR_DEPARTMENT_OTHER): Payer: Medicare Other | Admitting: Internal Medicine

## 2016-01-04 ENCOUNTER — Other Ambulatory Visit: Payer: Self-pay | Admitting: Internal Medicine

## 2016-01-04 ENCOUNTER — Other Ambulatory Visit (HOSPITAL_BASED_OUTPATIENT_CLINIC_OR_DEPARTMENT_OTHER): Payer: Medicare Other

## 2016-01-04 ENCOUNTER — Telehealth: Payer: Self-pay | Admitting: Internal Medicine

## 2016-01-04 ENCOUNTER — Ambulatory Visit (HOSPITAL_BASED_OUTPATIENT_CLINIC_OR_DEPARTMENT_OTHER): Payer: Medicare Other

## 2016-01-04 ENCOUNTER — Encounter: Payer: Self-pay | Admitting: Internal Medicine

## 2016-01-04 VITALS — BP 113/89 | HR 98 | Temp 97.6°F | Resp 16

## 2016-01-04 DIAGNOSIS — C61 Malignant neoplasm of prostate: Secondary | ICD-10-CM

## 2016-01-04 DIAGNOSIS — Z5112 Encounter for antineoplastic immunotherapy: Secondary | ICD-10-CM | POA: Diagnosis present

## 2016-01-04 DIAGNOSIS — C3401 Malignant neoplasm of right main bronchus: Secondary | ICD-10-CM

## 2016-01-04 DIAGNOSIS — C787 Secondary malignant neoplasm of liver and intrahepatic bile duct: Secondary | ICD-10-CM

## 2016-01-04 DIAGNOSIS — G893 Neoplasm related pain (acute) (chronic): Secondary | ICD-10-CM

## 2016-01-04 DIAGNOSIS — C3491 Malignant neoplasm of unspecified part of right bronchus or lung: Secondary | ICD-10-CM

## 2016-01-04 DIAGNOSIS — C801 Malignant (primary) neoplasm, unspecified: Principal | ICD-10-CM

## 2016-01-04 DIAGNOSIS — C7951 Secondary malignant neoplasm of bone: Secondary | ICD-10-CM

## 2016-01-04 DIAGNOSIS — C7889 Secondary malignant neoplasm of other digestive organs: Secondary | ICD-10-CM

## 2016-01-04 LAB — CBC WITH DIFFERENTIAL/PLATELET
BASO%: 0.6 % (ref 0.0–2.0)
Basophils Absolute: 0 10*3/uL (ref 0.0–0.1)
EOS ABS: 0.3 10*3/uL (ref 0.0–0.5)
EOS%: 3.4 % (ref 0.0–7.0)
HEMATOCRIT: 35.2 % — AB (ref 38.4–49.9)
HGB: 11.5 g/dL — ABNORMAL LOW (ref 13.0–17.1)
LYMPH#: 2.1 10*3/uL (ref 0.9–3.3)
LYMPH%: 24 % (ref 14.0–49.0)
MCH: 30.2 pg (ref 27.2–33.4)
MCHC: 32.6 g/dL (ref 32.0–36.0)
MCV: 92.6 fL (ref 79.3–98.0)
MONO#: 1 10*3/uL — AB (ref 0.1–0.9)
MONO%: 11 % (ref 0.0–14.0)
NEUT%: 61 % (ref 39.0–75.0)
NEUTROS ABS: 5.3 10*3/uL (ref 1.5–6.5)
PLATELETS: 272 10*3/uL (ref 140–400)
RBC: 3.8 10*6/uL — AB (ref 4.20–5.82)
RDW: 19.1 % — ABNORMAL HIGH (ref 11.0–14.6)
WBC: 8.7 10*3/uL (ref 4.0–10.3)

## 2016-01-04 LAB — COMPREHENSIVE METABOLIC PANEL
ALK PHOS: 129 U/L (ref 40–150)
ALT: 21 U/L (ref 0–55)
ANION GAP: 8 meq/L (ref 3–11)
AST: 21 U/L (ref 5–34)
Albumin: 3.2 g/dL — ABNORMAL LOW (ref 3.5–5.0)
BUN: 45.5 mg/dL — ABNORMAL HIGH (ref 7.0–26.0)
CALCIUM: 9 mg/dL (ref 8.4–10.4)
CO2: 16 mEq/L — ABNORMAL LOW (ref 22–29)
CREATININE: 2.7 mg/dL — AB (ref 0.7–1.3)
Chloride: 112 mEq/L — ABNORMAL HIGH (ref 98–109)
EGFR: 29 mL/min/{1.73_m2} — AB (ref >90)
Glucose: 80 mg/dl (ref 70–140)
Potassium: 4.7 mEq/L (ref 3.5–5.1)
Sodium: 136 mEq/L (ref 136–145)
TOTAL PROTEIN: 8 g/dL (ref 6.4–8.3)

## 2016-01-04 MED ORDER — SODIUM CHLORIDE 0.9 % IV SOLN
240.0000 mg | Freq: Once | INTRAVENOUS | Status: AC
  Administered 2016-01-04: 240 mg via INTRAVENOUS
  Filled 2016-01-04: qty 20

## 2016-01-04 MED ORDER — SODIUM CHLORIDE 0.9 % IJ SOLN
10.0000 mL | INTRAMUSCULAR | Status: DC | PRN
  Administered 2016-01-04: 10 mL
  Filled 2016-01-04: qty 10

## 2016-01-04 MED ORDER — OXYCODONE-ACETAMINOPHEN 5-325 MG PO TABS: 1.0000 | ORAL_TABLET | ORAL | Status: DC | PRN

## 2016-01-04 MED ORDER — HEPARIN SOD (PORK) LOCK FLUSH 100 UNIT/ML IV SOLN
500.0000 [IU] | Freq: Once | INTRAVENOUS | Status: AC | PRN
  Administered 2016-01-04: 500 [IU]
  Filled 2016-01-04: qty 5

## 2016-01-04 MED ORDER — SODIUM CHLORIDE 0.9 % IV SOLN
Freq: Once | INTRAVENOUS | Status: AC
  Administered 2016-01-04: 14:00:00 via INTRAVENOUS

## 2016-01-04 NOTE — Patient Instructions (Signed)
[facility] Discharge Instructions for Patients Receiving Chemotherapy  Today you received the following chemotherapy agents nivolumab   To help prevent nausea and vomiting after your treatment, we encourage you to take your nausea medication if needed.   If you develop nausea and vomiting that is not controlled by your nausea medication, call the clinic.   BELOW ARE SYMPTOMS THAT SHOULD BE REPORTED IMMEDIATELY:  *FEVER GREATER THAN 100.5 F  *CHILLS WITH OR WITHOUT FEVER  NAUSEA AND VOMITING THAT IS NOT CONTROLLED WITH YOUR NAUSEA MEDICATION  *UNUSUAL SHORTNESS OF BREATH  *UNUSUAL BRUISING OR BLEEDING  TENDERNESS IN MOUTH AND THROAT WITH OR WITHOUT PRESENCE OF ULCERS  *URINARY PROBLEMS  *BOWEL PROBLEMS  UNUSUAL RASH Items with * indicate a potential emergency and should be followed up as soon as possible.  Feel free to call the clinic you have any questions or concerns. The clinic phone number is [phone].  Please show the [place] at check-in to the Emergency Department and triage nurse.

## 2016-01-04 NOTE — Progress Notes (Signed)
[facility] [phone]   [phone]  OFFICE PROGRESS NOTE  [name], [name], MD [place] [id number]  DIAGNOSIS:  1) Stage IV (T2b, N2, M1b) non-small cell lung cancer, adenocarcinoma with negative EGFR mutation and negative gene translocation presented with a large right hilar mass as well as mediastinal lymphadenopathy and metastatic disease to the liver, bone and pancreas diagnosed in February 2016. 2) prostate adenocarcinoma diagnosed at [facility] with Gleason score 9 (4+5).  PRIOR THERAPY: Systemic chemotherapy with carboplatin for AUC of 5 and Alimta 500 MG/M2 every 3 weeks. Status post 6 cycles.  CURRENT THERAPY:  1) Immunotherapy with Nivolumab 240 MG every 2 weeks, status post 18 cycles. 2) Lupron and Casodex for the recently diagnosed prostate adenocarcinoma at [facility]: Francisco Love 55 y.o. male returns to the clinic today for follow-up visit accompanied by his brother-in-law. The patient continues to tolerate his current treatment with immunotherapy with Nivolumab fairly well. He denied having any specific complaints except for dry skin. He also has decubitus ulcer on the lower back and currently managed by his primary care physician. He is also scheduled to see a dermatologist next week for evaluation and management of this condition. He denied having any significant nausea or vomiting. The patient denied having any fever or chills, no significant weight loss or night sweats. He has no chest pain, shortness of breath but has mild cough with no hemoptysis. He is here today to start cycle #19 of his treatment with immunotherapy.  MEDICAL HISTORY: Past Medical History  Diagnosis Date  . Shortness of breath dyspnea     ALLERGIES:  has No Known Allergies.  MEDICATIONS:  Current Outpatient Prescriptions  Medication Sig Dispense Refill  . acetaminophen (TYLENOL) 325 MG tablet Take  2 tablets (650 mg total) by mouth every 6 (six) hours as needed for mild pain (or Fever >/= 101).    Marland Kitchen albuterol (PROVENTIL) (2.5 MG/3ML) 0.083% nebulizer solution Take 3 mLs (2.5 mg total) by nebulization every 2 (two) hours as needed for wheezing. 75 mL 12  . AMITIZA 24 MCG capsule     . bicalutamide (CASODEX) 50 MG tablet Take 50 mg by mouth.    . clobetasol ointment (TEMOVATE) 0.05 % Apply topically.    . diphenhydrAMINE (BENADRYL) 25 MG tablet Take 25 mg by mouth.    . diphenhydrAMINE (SOMINEX) 25 MG tablet Take 25 mg by mouth as needed for itching or sleep.    Marland Kitchen doxycycline (ADOXA) 100 MG tablet     . ENSURE PLUS (ENSURE PLUS) LIQD Take by mouth.    . feeding supplement, ENSURE COMPLETE, (ENSURE COMPLETE) LIQD Take 237 mLs by mouth 3 (three) times daily between meals.    Marland Kitchen FLUOCINOLONE ACETONIDE BODY 0.01 % OIL     . FLUOCINOLONE ACETONIDE BODY 0.01 % OIL     . levocetirizine (XYZAL) 5 MG tablet Take 5 mg by mouth.    . levocetirizine (XYZAL) 5 MG tablet     . levofloxacin (LEVAQUIN) 500 MG tablet     . lidocaine-prilocaine (EMLA) cream Apply 1 application topically as needed. Apply small amount  to port site 1.5 hours prior to chemotherapy. Do not rub in cream . Cover with cling wrap 30 g 0  . mirtazapine (REMERON) 15 MG tablet Take 1 tablet (15 mg total) by mouth at bedtime.    Marland Kitchen oxyCODONE-acetaminophen (PERCOCET/ROXICET) 5-325 MG tablet Take 1  tablet by mouth every 4 (four) hours as needed for severe pain. 60 tablet 0  . polyethylene glycol (MIRALAX / GLYCOLAX) packet Take 17 g by mouth daily.    . prochlorperazine (COMPAZINE) 10 MG tablet Take 1 tablet (10 mg total) by mouth every 6 (six) hours as needed for nausea or vomiting. 30 tablet 1  . senna-docusate (SENOKOT-S) 8.6-50 MG per tablet Take 1 tablet by mouth daily.    . tamsulosin (FLOMAX) 0.4 MG CAPS capsule     . valACYclovir (VALTREX) 1000 MG tablet      No current facility-administered medications for this visit.     SURGICAL HISTORY:  Past Surgical History  Procedure Laterality Date  . Appendectomy    . Video bronchoscopy N/A 09/21/2014    Procedure: VIDEO BRONCHOSCOPY WITH FLUORO;  Surgeon: Rigoberto Noel, MD;  Location: [facility];  Service: Cardiopulmonary;  Laterality: N/A;  . Video bronchoscopy with endobronchial ultrasound N/A 09/23/2014    Procedure: VIDEO BRONCHOSCOPY WITH ENDOBRONCHIAL ULTRASOUND;  Surgeon: Rigoberto Noel, MD;  Location: [place];  Service: Pulmonary;  Laterality: N/A;    REVIEW OF SYSTEMS:  A comprehensive review of systems was negative except for: Constitutional: positive for fatigue   PHYSICAL EXAMINATION: General appearance: alert, cooperative, fatigued and no distress Head: Normocephalic, without obvious abnormality, atraumatic Neck: no adenopathy, no JVD, supple, symmetrical, trachea midline and thyroid not enlarged, symmetric, no tenderness/mass/nodules Lymph nodes: Cervical, supraclavicular, and axillary nodes normal. Resp: clear to auscultation bilaterally Back: symmetric, no curvature. ROM normal. No CVA tenderness. Cardio: regular rate and rhythm, S1, S2 normal, no murmur, click, rub or gallop GI: soft, non-tender; bowel sounds normal; no masses,  no organomegaly Extremities: extremities normal, atraumatic, no cyanosis or edema Neurologic: Alert and oriented X 3, normal strength and tone. Normal symmetric reflexes. Normal coordination and gait  ECOG PERFORMANCE STATUS: 2 - Symptomatic, <50% confined to bed  There were no vitals taken for this visit.  LABORATORY DATA: Lab Results  Component Value Date   WBC 8.7 01/04/2016   HGB 11.5* 01/04/2016   HCT 35.2* 01/04/2016   MCV 92.6 01/04/2016   PLT 272 01/04/2016      Chemistry      Component Value Date/Time   NA 138 12/21/2015 0840   NA 135 10/25/2014 0845   K 4.9 12/21/2015 0840   K 5.3* 10/25/2014 0845   CL 108 10/25/2014 0845   CO2 15* 12/21/2015 0840   CO2 19 10/25/2014 0845   BUN 35.9*  12/21/2015 0840   BUN 22 10/25/2014 0845   CREATININE 2.3* 12/21/2015 0840   CREATININE 1.03 10/25/2014 0845      Component Value Date/Time   CALCIUM 9.8 12/21/2015 0840   CALCIUM 8.6 10/25/2014 0845   ALKPHOS 147 12/21/2015 0840   ALKPHOS 882* 09/20/2014 1120   AST 18 12/21/2015 0840   AST 29 09/20/2014 1120   ALT 22 12/21/2015 0840   ALT 34 09/20/2014 1120   BILITOT 0.31 12/21/2015 0840   BILITOT 0.1* 09/20/2014 1120       RADIOGRAPHIC STUDIES: Ct Abdomen Pelvis Wo Contrast  12/07/2015  CLINICAL DATA:  Non-small cell lung cancer diagnosed February 2016. Chemotherapy in progress. Bone metastasis. Additional history of prostate cancer. EXAM: CT CHEST, ABDOMEN AND PELVIS WITHOUT CONTRAST TECHNIQUE: Multidetector CT imaging of the chest, abdomen and pelvis was performed following the standard protocol without IV contrast. COMPARISON:  CT 09/28/2015, PET-CT 10/15/2014. FINDINGS: CT CHEST FINDINGS Mediastinum/Nodes: No axillary adenopathy. Port in the RIGHT chest wall.  No supraclavicular lymphadenopathy. Partially calcified enlarged paratracheal lymph nodes are again demonstrated. RIGHT lower paratracheal lymph node measures 2.5 cm short axis compared to 2.7 cm. Subcarinal lymph node measures 12 mm short axis compared to 9 mm. Partially calcified RIGHT hilar lymph nodes are similar. Prevascular lymph nodes measuring 10 mm unchanged. No pericardial fluid. Esophagus normal. Lungs/Pleura: Biapical nodularity and scarring is unchanged appears chronic. Calcified granuloma in the LEFT upper lobe on image 64 series 5 is unchanged. Small bilateral pleural effusions noted. There is mild ground-glass opacities throughout the lungs suggesting mild edema. Musculoskeletal: Widespread skeletal metastasis within the ribs and spine CT ABDOMEN AND PELVIS FINDINGS Hepatobiliary: No focal hepatic lesion on this noncontrast exam. Normal gallbladder Pancreas: Chronic calcifications the pancreatic head. No pancreatic  duct dilatation. No change from prior. Spleen: Normal spleen Adrenals/urinary tract: Adrenal glands and kidneys are normal. The ureters and bladder normal. Stomach/Bowel: Stomach, small bowel, appendix, and cecum are normal. The colon and rectosigmoid colon are normal. Vascular/Lymphatic: Abdominal aorta is normal caliber with atherosclerotic calcification. There is no retroperitoneal or periportal lymphadenopathy. No pelvic lymphadenopathy. Reproductive: Prostate small. Bilateral hydroceles partially imaged within the scrotum. No change from 10/15/2014, PET-CT scan. Other: No peritoneal disease Musculoskeletal: Widespread sclerotic osseous metastasis within the pelvis, spine and ribs not changed from prior. Severe degenerate change of the femoral heads. IMPRESSION: Chest Impression: 1. Stable enlarged partially calcified paratracheal and hilar lymphadenopathy. 2. Bilateral pleural effusions and mild edema. 3. Stable benign-appearing pulmonary nodules. Abdomen / Pelvis Impression: 1. No evidence of metastatic disease in soft tissues of the abdomen pelvis. 2. Widespread sclerotic osseous metastasis not changed from prior. 3. Severe degenerative change of bilateral hip joints . 4. Chronic bilateral scrotal hydroceles partially imaged. Electronically Signed   By: Suzy Bouchard M.D.   On: 12/07/2015 08:52   Ct Chest Wo Contrast  12/07/2015  CLINICAL DATA:  Non-small cell lung cancer diagnosed February 2016. Chemotherapy in progress. Bone metastasis. Additional history of prostate cancer. EXAM: CT CHEST, ABDOMEN AND PELVIS WITHOUT CONTRAST TECHNIQUE: Multidetector CT imaging of the chest, abdomen and pelvis was performed following the standard protocol without IV contrast. COMPARISON:  CT 09/28/2015, PET-CT 10/15/2014. FINDINGS: CT CHEST FINDINGS Mediastinum/Nodes: No axillary adenopathy. Port in the RIGHT chest wall. No supraclavicular lymphadenopathy. Partially calcified enlarged paratracheal lymph nodes are  again demonstrated. RIGHT lower paratracheal lymph node measures 2.5 cm short axis compared to 2.7 cm. Subcarinal lymph node measures 12 mm short axis compared to 9 mm. Partially calcified RIGHT hilar lymph nodes are similar. Prevascular lymph nodes measuring 10 mm unchanged. No pericardial fluid. Esophagus normal. Lungs/Pleura: Biapical nodularity and scarring is unchanged appears chronic. Calcified granuloma in the LEFT upper lobe on image 64 series 5 is unchanged. Small bilateral pleural effusions noted. There is mild ground-glass opacities throughout the lungs suggesting mild edema. Musculoskeletal: Widespread skeletal metastasis within the ribs and spine CT ABDOMEN AND PELVIS FINDINGS Hepatobiliary: No focal hepatic lesion on this noncontrast exam. Normal gallbladder Pancreas: Chronic calcifications the pancreatic head. No pancreatic duct dilatation. No change from prior. Spleen: Normal spleen Adrenals/urinary tract: Adrenal glands and kidneys are normal. The ureters and bladder normal. Stomach/Bowel: Stomach, small bowel, appendix, and cecum are normal. The colon and rectosigmoid colon are normal. Vascular/Lymphatic: Abdominal aorta is normal caliber with atherosclerotic calcification. There is no retroperitoneal or periportal lymphadenopathy. No pelvic lymphadenopathy. Reproductive: Prostate small. Bilateral hydroceles partially imaged within the scrotum. No change from 10/15/2014, PET-CT scan. Other: No peritoneal disease Musculoskeletal: Widespread sclerotic osseous metastasis within  the pelvis, spine and ribs not changed from prior. Severe degenerate change of the femoral heads. IMPRESSION: Chest Impression: 1. Stable enlarged partially calcified paratracheal and hilar lymphadenopathy. 2. Bilateral pleural effusions and mild edema. 3. Stable benign-appearing pulmonary nodules. Abdomen / Pelvis Impression: 1. No evidence of metastatic disease in soft tissues of the abdomen pelvis. 2. Widespread sclerotic  osseous metastasis not changed from prior. 3. Severe degenerative change of bilateral hip joints . 4. Chronic bilateral scrotal hydroceles partially imaged. Electronically Signed   By: Suzy Bouchard M.D.   On: 12/07/2015 08:52   ASSESSMENT AND PLAN: This is a very pleasant 55 years old African-American male with: 1)  metastatic non-small cell lung cancer, adenocarcinoma status post induction chemotherapy with carboplatin and Alimta with initial good response followed by disease progression. The patient is currently on second line treatment with immunotherapy with Nivolumab status post 18 cycles. He tolerated the last cycle of his treatment fairly well with no concerning adverse effects except for dry skin and pruritus.  I recommended for him to continue his treatment with immunotherapy with cycle #19 today as scheduled.  2) recently diagnosed prostate adenocarcinoma: He is followed at [facility] for this problem. He is currently on hormonal therapy with Lupron and Casodex.  3) pain management, he would continue on Percocet as needed. He was given a refill of Percocet today.  4) metastatic bone disease, I discussed with the patient consideration of treatment with Xgeva after receive dental clearance. He is still reluctant about new treatment. The patient would come back for follow-up visit in 2 weeks for reevaluation before starting cycle #20. He was advised to call immediately if he has any concerning symptoms in the interval. The patient voices understanding of current disease status and treatment options and is in agreement with the current care plan.  All questions were answered. The patient knows to call the clinic with any problems, questions or concerns. We can certainly see the patient much sooner if necessary.  Disclaimer: This note was dictated with voice recognition software. Similar sounding words can inadvertently be transcribed and may not be corrected upon review.

## 2016-01-04 NOTE — Telephone Encounter (Signed)
Gave pt apt & avs °

## 2016-01-04 NOTE — Progress Notes (Signed)
OK to treat pt with creatinine 2.7 per Dr Julien Nordmann

## 2016-01-18 ENCOUNTER — Telehealth: Payer: Self-pay | Admitting: Internal Medicine

## 2016-01-18 ENCOUNTER — Ambulatory Visit (HOSPITAL_BASED_OUTPATIENT_CLINIC_OR_DEPARTMENT_OTHER): Payer: Medicare Other

## 2016-01-18 ENCOUNTER — Other Ambulatory Visit: Payer: Self-pay | Admitting: *Deleted

## 2016-01-18 ENCOUNTER — Telehealth: Payer: Self-pay | Admitting: *Deleted

## 2016-01-18 ENCOUNTER — Ambulatory Visit (HOSPITAL_BASED_OUTPATIENT_CLINIC_OR_DEPARTMENT_OTHER): Payer: Medicare Other | Admitting: Internal Medicine

## 2016-01-18 ENCOUNTER — Other Ambulatory Visit (HOSPITAL_BASED_OUTPATIENT_CLINIC_OR_DEPARTMENT_OTHER): Payer: Medicare Other

## 2016-01-18 ENCOUNTER — Encounter: Payer: Self-pay | Admitting: Internal Medicine

## 2016-01-18 VITALS — BP 137/74 | HR 124 | Temp 98.2°F | Resp 18 | Ht 69.0 in | Wt 149.3 lb

## 2016-01-18 DIAGNOSIS — C7951 Secondary malignant neoplasm of bone: Secondary | ICD-10-CM

## 2016-01-18 DIAGNOSIS — G893 Neoplasm related pain (acute) (chronic): Secondary | ICD-10-CM

## 2016-01-18 DIAGNOSIS — C3401 Malignant neoplasm of right main bronchus: Secondary | ICD-10-CM

## 2016-01-18 DIAGNOSIS — Z5112 Encounter for antineoplastic immunotherapy: Secondary | ICD-10-CM

## 2016-01-18 DIAGNOSIS — C61 Malignant neoplasm of prostate: Secondary | ICD-10-CM

## 2016-01-18 DIAGNOSIS — C3491 Malignant neoplasm of unspecified part of right bronchus or lung: Secondary | ICD-10-CM

## 2016-01-18 DIAGNOSIS — C787 Secondary malignant neoplasm of liver and intrahepatic bile duct: Secondary | ICD-10-CM | POA: Diagnosis not present

## 2016-01-18 DIAGNOSIS — C801 Malignant (primary) neoplasm, unspecified: Principal | ICD-10-CM

## 2016-01-18 LAB — CBC WITH DIFFERENTIAL/PLATELET
BASO%: 0.5 % (ref 0.0–2.0)
BASOS ABS: 0 10*3/uL (ref 0.0–0.1)
EOS%: 4 % (ref 0.0–7.0)
Eosinophils Absolute: 0.4 10*3/uL (ref 0.0–0.5)
HCT: 36.4 % — ABNORMAL LOW (ref 38.4–49.9)
HEMOGLOBIN: 11.8 g/dL — AB (ref 13.0–17.1)
LYMPH%: 22.4 % (ref 14.0–49.0)
MCH: 30.6 pg (ref 27.2–33.4)
MCHC: 32.5 g/dL (ref 32.0–36.0)
MCV: 94.3 fL (ref 79.3–98.0)
MONO#: 1 10*3/uL — ABNORMAL HIGH (ref 0.1–0.9)
MONO%: 10.2 % (ref 0.0–14.0)
NEUT#: 6 10*3/uL (ref 1.5–6.5)
NEUT%: 62.9 % (ref 39.0–75.0)
Platelets: 263 10*3/uL (ref 140–400)
RBC: 3.86 10*6/uL — ABNORMAL LOW (ref 4.20–5.82)
RDW: 18.7 % — AB (ref 11.0–14.6)
WBC: 9.5 10*3/uL (ref 4.0–10.3)
lymph#: 2.1 10*3/uL (ref 0.9–3.3)

## 2016-01-18 LAB — COMPREHENSIVE METABOLIC PANEL
ALBUMIN: 3.2 g/dL — AB (ref 3.5–5.0)
ALT: 31 U/L (ref 0–55)
AST: 23 U/L (ref 5–34)
Alkaline Phosphatase: 120 U/L (ref 40–150)
Anion Gap: 11 mEq/L (ref 3–11)
BUN: 39.4 mg/dL — AB (ref 7.0–26.0)
CHLORIDE: 110 meq/L — AB (ref 98–109)
CO2: 16 meq/L — AB (ref 22–29)
Calcium: 9.3 mg/dL (ref 8.4–10.4)
Creatinine: 2.4 mg/dL — ABNORMAL HIGH (ref 0.7–1.3)
EGFR: 34 mL/min/{1.73_m2} — ABNORMAL LOW (ref >90)
GLUCOSE: 97 mg/dL (ref 70–140)
POTASSIUM: 5.1 meq/L (ref 3.5–5.1)
SODIUM: 137 meq/L (ref 136–145)
Total Bilirubin: <0.3 mg/dL (ref 0.20–1.20)
Total Protein: 8.2 g/dL (ref 6.4–8.3)

## 2016-01-18 MED ORDER — LIDOCAINE-PRILOCAINE 2.5-2.5 % EX CREA: 1.0000 "application " | TOPICAL_CREAM | CUTANEOUS | Status: DC | PRN

## 2016-01-18 MED ORDER — SODIUM CHLORIDE 0.9 % IV SOLN
240.0000 mg | Freq: Once | INTRAVENOUS | Status: AC
  Administered 2016-01-18: 240 mg via INTRAVENOUS
  Filled 2016-01-18: qty 8

## 2016-01-18 MED ORDER — SODIUM CHLORIDE 0.9 % IV SOLN
Freq: Once | INTRAVENOUS | Status: AC
  Administered 2016-01-18: 10:00:00 via INTRAVENOUS

## 2016-01-18 MED ORDER — SODIUM CHLORIDE 0.9 % IJ SOLN
10.0000 mL | INTRAMUSCULAR | Status: DC | PRN
  Administered 2016-01-18: 10 mL
  Filled 2016-01-18: qty 10

## 2016-01-18 MED ORDER — OXYCODONE-ACETAMINOPHEN 5-325 MG PO TABS: 1.0000 | ORAL_TABLET | ORAL | Status: DC | PRN

## 2016-01-18 MED ORDER — HEPARIN SOD (PORK) LOCK FLUSH 100 UNIT/ML IV SOLN
500.0000 [IU] | Freq: Once | INTRAVENOUS | Status: AC | PRN
  Administered 2016-01-18: 500 [IU]
  Filled 2016-01-18: qty 5

## 2016-01-18 NOTE — Telephone Encounter (Signed)
per pof to sch pt appt-adv pt [facility] will call to sch trmt-no avs needed pt appt already sch through 7/1

## 2016-01-18 NOTE — Progress Notes (Signed)
Verbal order " ok to treat despite counts", cbc and cmet reviewed by MD

## 2016-01-18 NOTE — Telephone Encounter (Signed)
Oncology Nurse Navigator Documentation  Oncology Nurse Navigator Flowsheets 01/18/2016  Navigator Location [facility] On[facility]  Navigator Encounter Type Clinic/MDC  Patient Visit Type MedOnc  Treatment Phase Treatment  Barriers/Navigation Needs Financial  Interventions Other  Acuity Level 2  Time Spent with Patient 30   Patient was in need of financial support for gas money.  He comes from [place] to get his treatment.  I gave him 25 dollar gift card to [facility] for gas.  I also helped patient to complete pre-evaluation for [employer].  I will update CSW this has been completed.

## 2016-01-18 NOTE — Patient Instructions (Addendum)
[name] Discharge Instructions for Patients Receiving Chemotherapy  Today you received the following chemotherapy agents: Nivolumab  To help prevent nausea and vomiting after your treatment, we encourage you to take your nausea medication. If you develop nausea and vomiting that is not controlled by your nausea medication, call the clinic.   BELOW ARE SYMPTOMS THAT SHOULD BE REPORTED IMMEDIATELY:  *FEVER GREATER THAN 100.5 F  *CHILLS WITH OR WITHOUT FEVER  NAUSEA AND VOMITING THAT IS NOT CONTROLLED WITH YOUR NAUSEA MEDICATION  *UNUSUAL SHORTNESS OF BREATH  *UNUSUAL BRUISING OR BLEEDING  TENDERNESS IN MOUTH AND THROAT WITH OR WITHOUT PRESENCE OF ULCERS  *URINARY PROBLEMS  *BOWEL PROBLEMS  UNUSUAL RASH Items with * indicate a potential emergency and should be followed up as soon as possible.  Feel free to call the clinic you have any questions or concerns. The clinic phone number is [phone].  Please show the [name] at check-in to the Emergency Department and triage nurse.  Therapeutic Phlebotomy Discharge Instructions  - Increase your fluid intake over the next 4 hours  - No smoking for 30 minutes  - Avoid using the affected arm (the one you had the blood drawn from) for heavy lifting or other activities.  - You may resume all normal activities after 30 minutes.  You are to notify the office if you experience:   - Persistent dizziness and/or lightheadedness -Uncontrolled or excessive bleeding at the site.

## 2016-01-18 NOTE — Progress Notes (Signed)
[place] [phone]   [phone]  OFFICE PROGRESS NOTE  [name], [name], MD [place] [address]  DIAGNOSIS:  1) Stage IV (T2b, N2, M1b) non-small cell lung cancer, adenocarcinoma with negative EGFR mutation and negative gene translocation presented with a large right hilar mass as well as mediastinal lymphadenopathy and metastatic disease to the liver, bone and pancreas diagnosed in February 2016. 2) prostate adenocarcinoma diagnosed at [facility] with Gleason score 9 (4+5).  PRIOR THERAPY: Systemic chemotherapy with carboplatin for AUC of 5 and Alimta 500 MG/M2 every 3 weeks. Status post 6 cycles.  CURRENT THERAPY:  1) Immunotherapy with Nivolumab 240 MG every 2 weeks, status post 19 cycles. 2) Lupron and Casodex for the recently diagnosed prostate adenocarcinoma at [facility]: Francisco Love 55 y.o. male returns to the clinic today for follow-up visit accompanied by his brother-in-law. For the first time in several months, the patient came walking to the clinic today. The patient continues to tolerate his current treatment with immunotherapy with Nivolumab fairly well. He denied having any specific complaints except for dry skin. He denied having any significant nausea or vomiting. The patient denied having any fever or chills, no significant weight loss or night sweats. He has no chest pain, shortness of breath but has mild cough with no hemoptysis. He is here today to start cycle #20 of his treatment with immunotherapy.  MEDICAL HISTORY: Past Medical History  Diagnosis Date  . Shortness of breath dyspnea     ALLERGIES:  has No Known Allergies.  MEDICATIONS:  Current Outpatient Prescriptions  Medication Sig Dispense Refill  . acetaminophen (TYLENOL) 325 MG tablet Take 2 tablets (650 mg total) by mouth every 6 (six) hours as needed for mild pain (or Fever >/= 101).    Marland Kitchen albuterol  (PROVENTIL) (2.5 MG/3ML) 0.083% nebulizer solution Take 3 mLs (2.5 mg total) by nebulization every 2 (two) hours as needed for wheezing. 75 mL 12  . AMITIZA 24 MCG capsule     . bicalutamide (CASODEX) 50 MG tablet Take 50 mg by mouth.    . clobetasol ointment (TEMOVATE) 0.05 % Apply topically.    . diphenhydrAMINE (BENADRYL) 25 MG tablet Take 25 mg by mouth.    . diphenhydrAMINE (SOMINEX) 25 MG tablet Take 25 mg by mouth as needed for itching or sleep.    Marland Kitchen doxycycline (ADOXA) 100 MG tablet     . ENSURE PLUS (ENSURE PLUS) LIQD Take by mouth.    . feeding supplement, ENSURE COMPLETE, (ENSURE COMPLETE) LIQD Take 237 mLs by mouth 3 (three) times daily between meals.    Marland Kitchen FLUOCINOLONE ACETONIDE BODY 0.01 % OIL     . FLUOCINOLONE ACETONIDE BODY 0.01 % OIL     . levocetirizine (XYZAL) 5 MG tablet Take 5 mg by mouth.    . levocetirizine (XYZAL) 5 MG tablet     . levofloxacin (LEVAQUIN) 500 MG tablet     . lidocaine-prilocaine (EMLA) cream Apply 1 application topically as needed. Apply small amount  to port site 1.5 hours prior to chemotherapy. Do not rub in cream . Cover with cling wrap 30 g 0  . mirtazapine (REMERON) 15 MG tablet Take 1 tablet (15 mg total) by mouth at bedtime.    Marland Kitchen oxyCODONE-acetaminophen (PERCOCET/ROXICET) 5-325 MG tablet Take 1 tablet by mouth every 4 (four) hours as needed for severe pain. 60 tablet 0  . polyethylene glycol (  MIRALAX / GLYCOLAX) packet Take 17 g by mouth daily.    . prochlorperazine (COMPAZINE) 10 MG tablet Take 1 tablet (10 mg total) by mouth every 6 (six) hours as needed for nausea or vomiting. 30 tablet 1  . senna-docusate (SENOKOT-S) 8.6-50 MG per tablet Take 1 tablet by mouth daily.    . tamsulosin (FLOMAX) 0.4 MG CAPS capsule     . valACYclovir (VALTREX) 1000 MG tablet      No current facility-administered medications for this visit.    SURGICAL HISTORY:  Past Surgical History  Procedure Laterality Date  . Appendectomy    . Video bronchoscopy N/A  09/21/2014    Procedure: VIDEO BRONCHOSCOPY WITH FLUORO;  Surgeon: Rigoberto Noel, MD;  Location: [facility];  Service: Cardiopulmonary;  Laterality: N/A;  . Video bronchoscopy with endobronchial ultrasound N/A 09/23/2014    Procedure: VIDEO BRONCHOSCOPY WITH ENDOBRONCHIAL ULTRASOUND;  Surgeon: Rigoberto Noel, MD;  Location: [facility];  Service: Pulmonary;  Laterality: N/A;    REVIEW OF SYSTEMS:  A comprehensive review of systems was negative except for: Constitutional: positive for fatigue   PHYSICAL EXAMINATION: General appearance: alert, cooperative, fatigued and no distress Head: Normocephalic, without obvious abnormality, atraumatic Neck: no adenopathy, no JVD, supple, symmetrical, trachea midline and thyroid not enlarged, symmetric, no tenderness/mass/nodules Lymph nodes: Cervical, supraclavicular, and axillary nodes normal. Resp: clear to auscultation bilaterally Back: symmetric, no curvature. ROM normal. No CVA tenderness. Cardio: regular rate and rhythm, S1, S2 normal, no murmur, click, rub or gallop GI: soft, non-tender; bowel sounds normal; no masses,  no organomegaly Extremities: extremities normal, atraumatic, no cyanosis or edema Neurologic: Alert and oriented X 3, normal strength and tone. Normal symmetric reflexes. Normal coordination and gait  ECOG PERFORMANCE STATUS: 2 - Symptomatic, <50% confined to bed  Blood pressure 137/74, pulse 124, temperature 98.2 F (36.8 C), temperature source Oral, resp. rate 18, height 5' 9"  (1.753 m), weight 149 lb 4.8 oz (67.722 kg), SpO2 96 %.  LABORATORY DATA: Lab Results  Component Value Date   WBC 9.5 01/18/2016   HGB 11.8* 01/18/2016   HCT 36.4* 01/18/2016   MCV 94.3 01/18/2016   PLT 263 01/18/2016      Chemistry      Component Value Date/Time   NA 136 01/04/2016 1146   NA 135 10/25/2014 0845   K 4.7 01/04/2016 1146   K 5.3* 10/25/2014 0845   CL 108 10/25/2014 0845   CO2 16* 01/04/2016 1146   CO2 19 10/25/2014 0845   BUN 45.5*  01/04/2016 1146   BUN 22 10/25/2014 0845   CREATININE 2.7* 01/04/2016 1146   CREATININE 1.03 10/25/2014 0845      Component Value Date/Time   CALCIUM 9.0 01/04/2016 1146   CALCIUM 8.6 10/25/2014 0845   ALKPHOS 129 01/04/2016 1146   ALKPHOS 882* 09/20/2014 1120   AST 21 01/04/2016 1146   AST 29 09/20/2014 1120   ALT 21 01/04/2016 1146   ALT 34 09/20/2014 1120   BILITOT <0.30 01/04/2016 1146   BILITOT 0.1* 09/20/2014 1120       RADIOGRAPHIC STUDIES: No results found. ASSESSMENT AND PLAN: This is a very pleasant 55 years old African-American male with: 1)  metastatic non-small cell lung cancer, adenocarcinoma status post induction chemotherapy with carboplatin and Alimta with initial good response followed by disease progression. The patient is currently on second line treatment with immunotherapy with Nivolumab status post 19 cycles. He tolerated the last cycle of his treatment fairly well with no concerning adverse  effects except for dry skin and pruritus.  I recommended for him to continue his treatment with immunotherapy with cycle #20 today as scheduled.  2) recently diagnosed prostate adenocarcinoma: He is followed at [facility] for this problem. He is currently on hormonal therapy with Lupron and Casodex.  3) pain management, he would continue on Percocet as needed. He was given a refill of Percocet today.  4) metastatic bone disease, I discussed with the patient consideration of treatment with Xgeva after receive dental clearance. He is still reluctant about new treatment. The patient would come back for follow-up visit in 2 weeks for reevaluation before starting cycle #21 after repeating CT scan of the chest, abdomen and pelvis without contrast for restaging of his disease. He was advised to call immediately if he has any concerning symptoms in the interval. The patient voices understanding of current disease status and treatment options and is in agreement with the  current care plan.  All questions were answered. The patient knows to call the clinic with any problems, questions or concerns. We can certainly see the patient much sooner if necessary.  Disclaimer: This note was dictated with voice recognition software. Similar sounding words can inadvertently be transcribed and may not be corrected upon review.

## 2016-02-01 ENCOUNTER — Other Ambulatory Visit (HOSPITAL_BASED_OUTPATIENT_CLINIC_OR_DEPARTMENT_OTHER): Payer: Medicare Other

## 2016-02-01 ENCOUNTER — Ambulatory Visit (HOSPITAL_BASED_OUTPATIENT_CLINIC_OR_DEPARTMENT_OTHER): Payer: Medicare Other | Admitting: Internal Medicine

## 2016-02-01 ENCOUNTER — Telehealth: Payer: Self-pay | Admitting: Internal Medicine

## 2016-02-01 ENCOUNTER — Ambulatory Visit (HOSPITAL_COMMUNITY)
Admission: RE | Admit: 2016-02-01 | Discharge: 2016-02-01 | Disposition: A | Discharge status: Home / Self Care | Payer: Medicare Other | Source: Ambulatory Visit | Attending: Internal Medicine | Admitting: Internal Medicine

## 2016-02-01 ENCOUNTER — Encounter: Payer: Self-pay | Admitting: Internal Medicine

## 2016-02-01 ENCOUNTER — Ambulatory Visit (HOSPITAL_BASED_OUTPATIENT_CLINIC_OR_DEPARTMENT_OTHER): Payer: Medicare Other

## 2016-02-01 VITALS — BP 117/91 | HR 110 | Temp 97.7°F | Resp 19 | Wt 151.2 lb

## 2016-02-01 DIAGNOSIS — C787 Secondary malignant neoplasm of liver and intrahepatic bile duct: Secondary | ICD-10-CM

## 2016-02-01 DIAGNOSIS — G893 Neoplasm related pain (acute) (chronic): Secondary | ICD-10-CM

## 2016-02-01 DIAGNOSIS — C7989 Secondary malignant neoplasm of other specified sites: Secondary | ICD-10-CM

## 2016-02-01 DIAGNOSIS — Z5112 Encounter for antineoplastic immunotherapy: Secondary | ICD-10-CM

## 2016-02-01 DIAGNOSIS — K861 Other chronic pancreatitis: Secondary | ICD-10-CM | POA: Insufficient documentation

## 2016-02-01 DIAGNOSIS — C7951 Secondary malignant neoplasm of bone: Secondary | ICD-10-CM

## 2016-02-01 DIAGNOSIS — C3491 Malignant neoplasm of unspecified part of right bronchus or lung: Secondary | ICD-10-CM

## 2016-02-01 DIAGNOSIS — C61 Malignant neoplasm of prostate: Secondary | ICD-10-CM | POA: Diagnosis not present

## 2016-02-01 DIAGNOSIS — C3401 Malignant neoplasm of right main bronchus: Secondary | ICD-10-CM | POA: Diagnosis present

## 2016-02-01 DIAGNOSIS — C801 Malignant (primary) neoplasm, unspecified: Principal | ICD-10-CM

## 2016-02-01 DIAGNOSIS — K8689 Other specified diseases of pancreas: Secondary | ICD-10-CM | POA: Insufficient documentation

## 2016-02-01 LAB — COMPREHENSIVE METABOLIC PANEL
ALT: 16 U/L (ref 0–55)
ANION GAP: 11 meq/L (ref 3–11)
AST: 18 U/L (ref 5–34)
Albumin: 3.5 g/dL (ref 3.5–5.0)
Alkaline Phosphatase: 119 U/L (ref 40–150)
BUN: 62.7 mg/dL — AB (ref 7.0–26.0)
CHLORIDE: 109 meq/L (ref 98–109)
CO2: 16 meq/L — AB (ref 22–29)
CREATININE: 3.2 mg/dL — AB (ref 0.7–1.3)
Calcium: 9.5 mg/dL (ref 8.4–10.4)
EGFR: 24 mL/min/{1.73_m2} — ABNORMAL LOW (ref >90)
GLUCOSE: 86 mg/dL (ref 70–140)
Potassium: 5.1 mEq/L (ref 3.5–5.1)
SODIUM: 135 meq/L — AB (ref 136–145)
TOTAL PROTEIN: 8.6 g/dL — AB (ref 6.4–8.3)

## 2016-02-01 LAB — CBC WITH DIFFERENTIAL/PLATELET
BASO%: 0.4 % (ref 0.0–2.0)
Basophils Absolute: 0 10*3/uL (ref 0.0–0.1)
EOS%: 3.2 % (ref 0.0–7.0)
Eosinophils Absolute: 0.3 10*3/uL (ref 0.0–0.5)
HCT: 35.8 % — ABNORMAL LOW (ref 38.4–49.9)
HGB: 11.9 g/dL — ABNORMAL LOW (ref 13.0–17.1)
LYMPH%: 22.4 % (ref 14.0–49.0)
MCH: 30.7 pg (ref 27.2–33.4)
MCHC: 33.2 g/dL (ref 32.0–36.0)
MCV: 92.3 fL (ref 79.3–98.0)
MONO#: 1 10*3/uL — AB (ref 0.1–0.9)
MONO%: 10.2 % (ref 0.0–14.0)
NEUT%: 63.8 % (ref 39.0–75.0)
NEUTROS ABS: 6.3 10*3/uL (ref 1.5–6.5)
PLATELETS: 312 10*3/uL (ref 140–400)
RBC: 3.88 10*6/uL — AB (ref 4.20–5.82)
RDW: 17.1 % — ABNORMAL HIGH (ref 11.0–14.6)
WBC: 9.8 10*3/uL (ref 4.0–10.3)
lymph#: 2.2 10*3/uL (ref 0.9–3.3)

## 2016-02-01 MED ORDER — OXYCODONE-ACETAMINOPHEN 5-325 MG PO TABS: 1.0000 | ORAL_TABLET | ORAL | Status: DC | PRN

## 2016-02-01 MED ORDER — HEPARIN SOD (PORK) LOCK FLUSH 100 UNIT/ML IV SOLN
500.0000 [IU] | Freq: Once | INTRAVENOUS | Status: AC | PRN
  Administered 2016-02-01: 500 [IU]
  Filled 2016-02-01: qty 5

## 2016-02-01 MED ORDER — SODIUM CHLORIDE 0.9 % IV SOLN
240.0000 mg | Freq: Once | INTRAVENOUS | Status: AC
  Administered 2016-02-01: 240 mg via INTRAVENOUS
  Filled 2016-02-01: qty 8

## 2016-02-01 MED ORDER — SODIUM CHLORIDE 0.9 % IV SOLN
Freq: Once | INTRAVENOUS | Status: AC
  Administered 2016-02-01: 10:00:00 via INTRAVENOUS

## 2016-02-01 MED ORDER — SODIUM CHLORIDE 0.9 % IJ SOLN
10.0000 mL | INTRAMUSCULAR | Status: DC | PRN
  Administered 2016-02-01: 10 mL
  Filled 2016-02-01: qty 10

## 2016-02-01 NOTE — Progress Notes (Signed)
Francisco Love [phone]   [phone]  OFFICE PROGRESS NOTE  [name], [name], MD [place] [id number]  DIAGNOSIS:  1) Stage IV (T2b, N2, M1b) non-small cell lung cancer, adenocarcinoma with negative EGFR mutation and negative gene translocation presented with a large right hilar mass as well as mediastinal lymphadenopathy and metastatic disease to the liver, bone and pancreas diagnosed in February 2016. 2) prostate adenocarcinoma diagnosed at [facility] with Gleason score 9 (4+5).  PRIOR THERAPY: Systemic chemotherapy with carboplatin for AUC of 5 and Alimta 500 MG/M2 every 3 weeks. Status post 6 cycles.  CURRENT THERAPY:  1) Immunotherapy with Nivolumab 240 MG every 2 weeks, status post 20 cycles. 2) Lupron and Casodex for the recently diagnosed prostate adenocarcinoma at [facility]: Francisco Love 55 y.o. male returns to the clinic today for follow-up visit accompanied by his brother-in-law. The patient is doing fine today with no specific complaints. He gained 2 pounds since his last visit. The patient continues to tolerate his current treatment with immunotherapy with Nivolumab fairly well. He denied having any specific complaints except for dry skin. He denied having any significant nausea or vomiting. The patient denied having any fever or chills, no significant weight loss or night sweats. He has no chest pain, shortness of breath but has mild cough with no hemoptysis. He had repeat CT scan of the chest, abdomen and pelvis performed earlier today and he is here for evaluation and discussion of his scan results.  MEDICAL HISTORY: Past Medical History  Diagnosis Date  . Shortness of breath dyspnea     ALLERGIES:  has No Known Allergies.  MEDICATIONS:  Current Outpatient Prescriptions  Medication Sig Dispense Refill  . acetaminophen (TYLENOL) 325 MG tablet Take 2 tablets (650 mg total) by  mouth every 6 (six) hours as needed for mild pain (or Fever >/= 101).    Marland Kitchen albuterol (PROVENTIL) (2.5 MG/3ML) 0.083% nebulizer solution Take 3 mLs (2.5 mg total) by nebulization every 2 (two) hours as needed for wheezing. 75 mL 12  . AMITIZA 24 MCG capsule     . bicalutamide (CASODEX) 50 MG tablet Take 50 mg by mouth.    . clobetasol ointment (TEMOVATE) 0.05 % Apply topically.    . diphenhydrAMINE (BENADRYL) 25 MG tablet Take 25 mg by mouth.    . doxycycline (ADOXA) 100 MG tablet     . ENSURE PLUS (ENSURE PLUS) LIQD Take by mouth.    . feeding supplement, ENSURE COMPLETE, (ENSURE COMPLETE) LIQD Take 237 mLs by mouth 3 (three) times daily between meals.    Marland Kitchen FLUOCINOLONE ACETONIDE BODY 0.01 % OIL     . levocetirizine (XYZAL) 5 MG tablet Take 5 mg by mouth.    . levofloxacin (LEVAQUIN) 500 MG tablet     . lidocaine-prilocaine (EMLA) cream Apply 1 application topically as needed. Apply to port site prior to chemotherapy. 30 g 0  . mirtazapine (REMERON) 15 MG tablet Take 1 tablet (15 mg total) by mouth at bedtime.    Marland Kitchen oxyCODONE-acetaminophen (PERCOCET/ROXICET) 5-325 MG tablet Take 1 tablet by mouth every 4 (four) hours as needed for severe pain. 60 tablet 0  . polyethylene glycol (MIRALAX / GLYCOLAX) packet Take 17 g by mouth daily.    . prochlorperazine (COMPAZINE) 10 MG tablet Take 1 tablet (10 mg total) by mouth every 6 (six) hours as needed for nausea or vomiting. 30 tablet 1  .  senna-docusate (SENOKOT-S) 8.6-50 MG per tablet Take 1 tablet by mouth daily.    . tamsulosin (FLOMAX) 0.4 MG CAPS capsule     . valACYclovir (VALTREX) 1000 MG tablet      No current facility-administered medications for this visit.    SURGICAL HISTORY:  Past Surgical History  Procedure Laterality Date  . Appendectomy    . Video bronchoscopy N/A 09/21/2014    Procedure: VIDEO BRONCHOSCOPY WITH FLUORO;  Surgeon: Rigoberto Noel, MD;  Location: [place];  Service: Cardiopulmonary;  Laterality: N/A;  . Video  bronchoscopy with endobronchial ultrasound N/A 09/23/2014    Procedure: VIDEO BRONCHOSCOPY WITH ENDOBRONCHIAL ULTRASOUND;  Surgeon: Rigoberto Noel, MD;  Location: [place];  Service: Pulmonary;  Laterality: N/A;    REVIEW OF SYSTEMS:  Constitutional: positive for fatigue Eyes: negative Ears, nose, mouth, throat, and face: negative Respiratory: negative Cardiovascular: negative Gastrointestinal: negative Genitourinary:negative Integument/breast: negative Hematologic/lymphatic: negative Musculoskeletal:positive for muscle weakness Neurological: negative Behavioral/Psych: negative Endocrine: negative Allergic/Immunologic: negative   PHYSICAL EXAMINATION: General appearance: alert, cooperative, fatigued and no distress Head: Normocephalic, without obvious abnormality, atraumatic Neck: no adenopathy, no JVD, supple, symmetrical, trachea midline and thyroid not enlarged, symmetric, no tenderness/mass/nodules Lymph nodes: Cervical, supraclavicular, and axillary nodes normal. Resp: clear to auscultation bilaterally Back: symmetric, no curvature. ROM normal. No CVA tenderness. Cardio: regular rate and rhythm, S1, S2 normal, no murmur, click, rub or gallop GI: soft, non-tender; bowel sounds normal; no masses,  no organomegaly Extremities: extremities normal, atraumatic, no cyanosis or edema Neurologic: Alert and oriented X 3, normal strength and tone. Normal symmetric reflexes. Normal coordination and gait  ECOG PERFORMANCE STATUS: 1 - Symptomatic but completely ambulatory  Blood pressure 117/91, pulse 110, temperature 97.7 F (36.5 C), temperature source Oral, resp. rate 19, weight 151 lb 3.2 oz (68.584 kg), SpO2 95 %.  LABORATORY DATA: Lab Results  Component Value Date   WBC 9.8 02/01/2016   HGB 11.9* 02/01/2016   HCT 35.8* 02/01/2016   MCV 92.3 02/01/2016   PLT 312 02/01/2016      Chemistry      Component Value Date/Time   NA 135* 02/01/2016 0853   NA 135 10/25/2014 0845   K 5.1  02/01/2016 0853   K 5.3* 10/25/2014 0845   CL 108 10/25/2014 0845   CO2 16* 02/01/2016 0853   CO2 19 10/25/2014 0845   BUN 62.7* 02/01/2016 0853   BUN 22 10/25/2014 0845   CREATININE 3.2* 02/01/2016 0853   CREATININE 1.03 10/25/2014 0845      Component Value Date/Time   CALCIUM 9.5 02/01/2016 0853   CALCIUM 8.6 10/25/2014 0845   ALKPHOS 119 02/01/2016 0853   ALKPHOS 882* 09/20/2014 1120   AST 18 02/01/2016 0853   AST 29 09/20/2014 1120   ALT 16 02/01/2016 0853   ALT 34 09/20/2014 1120   BILITOT <0.30 02/01/2016 0853   BILITOT 0.1* 09/20/2014 1120       RADIOGRAPHIC STUDIES: Ct Abdomen Pelvis Wo Contrast  02/01/2016  CLINICAL DATA:  55 year old male with history of lung cancer diagnosed October 2016 with known bone metastasis. Chemotherapy and process. EXAM: CT CHEST, ABDOMEN AND PELVIS WITHOUT CONTRAST TECHNIQUE: Multidetector CT imaging of the chest, abdomen and pelvis was performed following the standard protocol without IV contrast. COMPARISON:  CT the chest, abdomen and pelvis 12/07/2015. FINDINGS: CT CHEST FINDINGS Mediastinum/Lymph Nodes: Heart size is normal. There is no significant pericardial fluid, thickening or pericardial calcification. There is atherosclerosis of the thoracic aorta, the great vessels of the  mediastinum and the coronary arteries, including calcified atherosclerotic plaque in the left anterior descending coronary arteries. Again noted is a conglomerate nodal mass in the right paratracheal nodal station extending to involve the right hilar region was well level which is partially calcified and measures up to 4.0 x 2.9 cm (image 19 of series 2) which is similar to prior examination. No other new mediastinal or hilar lymphadenopathy is noted on today's noncontrast CT examination. Esophagus is unremarkable in appearance. No axillary lymphadenopathy. Right internal jugular single-lumen porta cath with tip terminating in the right atrium. Lungs/Pleura: Mild diffuse  ground-glass attenuation. Mild interlobular septal thickening, most evident in the lung bases. Trace amount of pleural fluid and/or pleural thickening dependently bilaterally, similar to the prior study. No confluent consolidative airspace disease. Focal area of architectural distortion around a calcified granuloma in the left upper lobe again noted. No other suspicious appearing pulmonary nodules or masses. Mild diffuse bronchial wall thickening with mild centrilobular and paraseptal emphysema. Musculoskeletal/Soft Tissues: Innumerable predominantly sclerotic lesions are again noted throughout the axial and appendicular skeleton, very similar in size, number and distribution compared to the prior study, compatible with widespread metastatic disease to the bones. This is most confluent within the T11 vertebral body which is completely infiltrated, similar to the prior study. No pathologic compression fractures are noted at this time within the thoracic spine. CT ABDOMEN AND PELVIS FINDINGS Hepatobiliary: No definite cystic or solid hepatic lesions are identified on today's noncontrast CT examination. Gallbladder is moderately distended, but there are no definite calcified gallstones and there are no surrounding inflammatory changes. Pancreas: Calcifications are again noted associated with the head of the pancreas. Poorly defined low-attenuation area in the head of the pancreas is similar to prior studies measuring approximately 1.4 x 1.8 cm on today's examination (image 67 of series 2), incompletely characterized. There does appear to be some pancreatic ductal dilatation, with the main pancreatic duct measuring up to 4 mm in the head and proximal body of the pancreas. No peripancreatic inflammatory changes. Spleen: Unremarkable. Adrenals/Urinary Tract: Unenhanced appearance of the kidneys and bilateral adrenal glands is unremarkable. There is no hydroureteronephrosis. Urinary bladder is normal in appearance.  Stomach/Bowel: Unenhanced appearance of the stomach is normal. No pathologic dilatation of small bowel or colon. The appendix is not confidently identified may be surgically absent. Regardless, there are no inflammatory changes noted adjacent to the cecum to suggest the presence of an acute appendicitis at this time. Vascular/Lymphatic: Extensive atherosclerotic calcifications throughout the abdominal and pelvic vasculature, without evidence of aneurysm. No definite lymphadenopathy noted in the abdomen or pelvis on today's noncontrast CT examination. Reproductive: Prostate gland and seminal vesicles are unremarkable in appearance. Other: No significant volume of ascites.  No pneumoperitoneum. Musculoskeletal: Widespread predominantly sclerotic lesions are again noted throughout all aspects of the axial and appendicular skeleton, compatible with widespread metastatic disease to the bones. This is most confluent in the left ilium above the left acetabulum. Femoral necks appear relatively spared, there is no pathologic fracture of either femur identified on today's examination. Flattening of the femoral heads bilaterally, likely related to avascular necrosis. Severe joint space narrowing, subchondral sclerosis, subchondral cyst formation and osteophyte formation in the hip joints bilaterally, compatible with advanced osteoarthritis. IMPRESSION: 1. Today's study demonstrates stable bulky partially calcified nodal mass in the right paratracheal and right hilar nodal stations, as well as widespread metastatic disease to the bones which appears essentially unchanged compared to the prior examinations. No other extra skeletal metastatic disease identified in  the chest, abdomen or pelvis. 2. Mild diffuse ground-glass attenuation and septal thickening in the lungs bilaterally which is similar in retrospect compared to the prior study. This appearance is typical for pulmonary edema, however, given the lack of cardiomegaly,  noncardiogenic causes should be considered. Trace bilateral pleural effusions and chronic bilateral pleural thickening are similar to the prior study. 3. Ill-defined and cystic appearing region in the pancreatic head is similar to numerous prior examinations, and incompletely characterized on today's noncontrast CT examination. Given the presence of pancreatic calcifications in the pancreatic head, this is favored to represent a small pseudocyst in the setting of chronic pancreatitis. Mild pancreatic ductal dilatation is also similar to prior examinations. 4. Additional incidental findings, as above, similar to the prior examinations. Electronically Signed   By: Vinnie Langton M.D.   On: 02/01/2016 09:42   Ct Chest Wo Contrast  02/01/2016  CLINICAL DATA:  55 year old male with history of lung cancer diagnosed October 2016 with known bone metastasis. Chemotherapy and process. EXAM: CT CHEST, ABDOMEN AND PELVIS WITHOUT CONTRAST TECHNIQUE: Multidetector CT imaging of the chest, abdomen and pelvis was performed following the standard protocol without IV contrast. COMPARISON:  CT the chest, abdomen and pelvis 12/07/2015. FINDINGS: CT CHEST FINDINGS Mediastinum/Lymph Nodes: Heart size is normal. There is no significant pericardial fluid, thickening or pericardial calcification. There is atherosclerosis of the thoracic aorta, the great vessels of the mediastinum and the coronary arteries, including calcified atherosclerotic plaque in the left anterior descending coronary arteries. Again noted is a conglomerate nodal mass in the right paratracheal nodal station extending to involve the right hilar region was well level which is partially calcified and measures up to 4.0 x 2.9 cm (image 19 of series 2) which is similar to prior examination. No other new mediastinal or hilar lymphadenopathy is noted on today's noncontrast CT examination. Esophagus is unremarkable in appearance. No axillary lymphadenopathy. Right internal  jugular single-lumen porta cath with tip terminating in the right atrium. Lungs/Pleura: Mild diffuse ground-glass attenuation. Mild interlobular septal thickening, most evident in the lung bases. Trace amount of pleural fluid and/or pleural thickening dependently bilaterally, similar to the prior study. No confluent consolidative airspace disease. Focal area of architectural distortion around a calcified granuloma in the left upper lobe again noted. No other suspicious appearing pulmonary nodules or masses. Mild diffuse bronchial wall thickening with mild centrilobular and paraseptal emphysema. Musculoskeletal/Soft Tissues: Innumerable predominantly sclerotic lesions are again noted throughout the axial and appendicular skeleton, very similar in size, number and distribution compared to the prior study, compatible with widespread metastatic disease to the bones. This is most confluent within the T11 vertebral body which is completely infiltrated, similar to the prior study. No pathologic compression fractures are noted at this time within the thoracic spine. CT ABDOMEN AND PELVIS FINDINGS Hepatobiliary: No definite cystic or solid hepatic lesions are identified on today's noncontrast CT examination. Gallbladder is moderately distended, but there are no definite calcified gallstones and there are no surrounding inflammatory changes. Pancreas: Calcifications are again noted associated with the head of the pancreas. Poorly defined low-attenuation area in the head of the pancreas is similar to prior studies measuring approximately 1.4 x 1.8 cm on today's examination (image 67 of series 2), incompletely characterized. There does appear to be some pancreatic ductal dilatation, with the main pancreatic duct measuring up to 4 mm in the head and proximal body of the pancreas. No peripancreatic inflammatory changes. Spleen: Unremarkable. Adrenals/Urinary Tract: Unenhanced appearance of the kidneys and bilateral adrenal  glands  is unremarkable. There is no hydroureteronephrosis. Urinary bladder is normal in appearance. Stomach/Bowel: Unenhanced appearance of the stomach is normal. No pathologic dilatation of small bowel or colon. The appendix is not confidently identified may be surgically absent. Regardless, there are no inflammatory changes noted adjacent to the cecum to suggest the presence of an acute appendicitis at this time. Vascular/Lymphatic: Extensive atherosclerotic calcifications throughout the abdominal and pelvic vasculature, without evidence of aneurysm. No definite lymphadenopathy noted in the abdomen or pelvis on today's noncontrast CT examination. Reproductive: Prostate gland and seminal vesicles are unremarkable in appearance. Other: No significant volume of ascites.  No pneumoperitoneum. Musculoskeletal: Widespread predominantly sclerotic lesions are again noted throughout all aspects of the axial and appendicular skeleton, compatible with widespread metastatic disease to the bones. This is most confluent in the left ilium above the left acetabulum. Femoral necks appear relatively spared, there is no pathologic fracture of either femur identified on today's examination. Flattening of the femoral heads bilaterally, likely related to avascular necrosis. Severe joint space narrowing, subchondral sclerosis, subchondral cyst formation and osteophyte formation in the hip joints bilaterally, compatible with advanced osteoarthritis. IMPRESSION: 1. Today's study demonstrates stable bulky partially calcified nodal mass in the right paratracheal and right hilar nodal stations, as well as widespread metastatic disease to the bones which appears essentially unchanged compared to the prior examinations. No other extra skeletal metastatic disease identified in the chest, abdomen or pelvis. 2. Mild diffuse ground-glass attenuation and septal thickening in the lungs bilaterally which is similar in retrospect compared to the prior study.  This appearance is typical for pulmonary edema, however, given the lack of cardiomegaly, noncardiogenic causes should be considered. Trace bilateral pleural effusions and chronic bilateral pleural thickening are similar to the prior study. 3. Ill-defined and cystic appearing region in the pancreatic head is similar to numerous prior examinations, and incompletely characterized on today's noncontrast CT examination. Given the presence of pancreatic calcifications in the pancreatic head, this is favored to represent a small pseudocyst in the setting of chronic pancreatitis. Mild pancreatic ductal dilatation is also similar to prior examinations. 4. Additional incidental findings, as above, similar to the prior examinations. Electronically Signed   By: Vinnie Langton M.D.   On: 02/01/2016 09:42   ASSESSMENT AND PLAN: This is a very pleasant 55 years old African-American male with:  1)  metastatic non-small cell lung cancer, adenocarcinoma status post induction chemotherapy with carboplatin and Alimta with initial good response followed by disease progression. The patient is currently on second line treatment with immunotherapy with Nivolumab status post 20 cycles. He tolerated the last cycle of his treatment fairly well with no concerning adverse effects.  The recent CT scan of the chest, abdomen and pelvis showed no evidence for disease progression. I discussed the scan results with the patient and his brother-in-law. I recommended for him to continue his treatment with immunotherapy with cycle #21 today as scheduled.  2) recently diagnosed prostate adenocarcinoma: He is [facility] at [facility] for this problem. He is currently on hormonal therapy with Lupron and Casodex. He is scheduled for the next injection on 03/15/2016.  3) pain management, he would continue on Percocet as needed. He was given a refill of Percocet today.  4) metastatic bone disease, I discussed with the patient consideration of  treatment with Xgeva after receive dental clearance. He is still reluctant about new treatment. The patient would come back for follow-up visit in 2 weeks for reevaluation before starting cycle #22.  He was advised to call immediately if he has any concerning symptoms in the interval. The patient voices understanding of current disease status and treatment options and is in agreement with the current care plan.  All questions were answered. The patient knows to call the clinic with any problems, questions or concerns. We can certainly see the patient much sooner if necessary.  Disclaimer: This note was dictated with voice recognition software. Similar sounding words can inadvertently be transcribed and may not be corrected upon review.

## 2016-02-01 NOTE — Progress Notes (Signed)
MD reviewed CBC/CMET- ok to treat despite labs. 

## 2016-02-01 NOTE — Progress Notes (Signed)
Per Dr Julien Nordmann it is okay to treat pt today with nivolumab and today 's lab results.

## 2016-02-01 NOTE — Patient Instructions (Signed)
Nivolumab injection What is this medicine? NIVOLUMAB (nye VOL ue mab) is a monoclonal antibody. It is used to treat melanoma, lung cancer, kidney cancer, and Hodgkin lymphoma. This medicine may be used for other purposes; ask your health care provider or pharmacist if you have questions. What should I tell my health care provider before I take this medicine? They need to know if you have any of these conditions: -diabetes -immune system problems -kidney disease -liver disease -lung disease -organ transplant -stomach or intestine problems -thyroid disease -an unusual or allergic reaction to nivolumab, other medicines, foods, dyes, or preservatives -pregnant or trying to get pregnant -breast-feeding How should I use this medicine? This medicine is for infusion into a vein. It is given by a health care professional in a hospital or clinic setting. A special MedGuide will be given to you before each treatment. Be sure to read this information carefully each time. Talk to your pediatrician regarding the use of this medicine in children. Special care may be needed. Overdosage: If you think you have taken too much of this medicine contact a poison control center or emergency room at once. NOTE: This medicine is only for you. Do not share this medicine with others. What if I miss a dose? It is important not to miss your dose. Call your doctor or health care professional if you are unable to keep an appointment. What may interact with this medicine? Interactions have not been studied. Give your health care provider a list of all the medicines, herbs, non-prescription drugs, or dietary supplements you use. Also tell them if you smoke, drink alcohol, or use illegal drugs. Some items may interact with your medicine. This list may not describe all possible interactions. Give your health care provider a list of all the medicines, herbs, non-prescription drugs, or dietary supplements you use. Also tell  them if you smoke, drink alcohol, or use illegal drugs. Some items may interact with your medicine. What should I watch for while using this medicine? This drug may make you feel generally unwell. Continue your course of treatment even though you feel ill unless your doctor tells you to stop. You may need blood work done while you are taking this medicine. Do not become pregnant while taking this medicine or for 5 months after stopping it. Women should inform their doctor if they wish to become pregnant or think they might be pregnant. There is a potential for serious side effects to an unborn child. Talk to your health care professional or pharmacist for more information. Do not breast-feed an infant while taking this medicine. What side effects may I notice from receiving this medicine? Side effects that you should report to your doctor or health care professional as soon as possible: -allergic reactions like skin rash, itching or hives, swelling of the face, lips, or tongue -black, tarry stools -blood in the urine -bloody or watery diarrhea -changes in vision -change in sex drive -changes in emotions or moods -chest pain -confusion -cough -decreased appetite -diarrhea -facial flushing -feeling faint or lightheaded -fever, chills -hair loss -hallucination, loss of contact with reality -headache -irritable -joint pain -loss of memory -muscle pain -muscle weakness -seizures -shortness of breath -signs and symptoms of high blood sugar such as dizziness; dry mouth; dry skin; fruity breath; nausea; stomach pain; increased hunger or thirst; increased urination -signs and symptoms of kidney injury like trouble passing urine or change in the amount of urine -signs and symptoms of liver injury like dark yellow or  brown urine; general ill feeling or flu-like symptoms; light-colored stools; loss of appetite; nausea; right upper belly pain; unusually weak or tired; yellowing of the eyes or  skin -stiff neck -swelling of the ankles, feet, hands -weight gain Side effects that usually do not require medical attention (report to your doctor or health care professional if they continue or are bothersome): -bone pain -constipation -tiredness -vomiting This list may not describe all possible side effects. Call your doctor for medical advice about side effects. You may report side effects to FDA at 1-800-FDA-1088. Where should I keep my medicine? This drug is given in a hospital or clinic and will not be stored at home. NOTE: This sheet is a summary. It may not cover all possible information. If you have questions about this medicine, talk to your doctor, pharmacist, or health care provider.    2016, [employer]vier/[employer] Standard. (2015-01-05 10:03:42)

## 2016-02-01 NOTE — Telephone Encounter (Signed)
Gave pt cal & avs °

## 2016-02-15 ENCOUNTER — Telehealth: Payer: Self-pay | Admitting: Internal Medicine

## 2016-02-15 ENCOUNTER — Other Ambulatory Visit (HOSPITAL_BASED_OUTPATIENT_CLINIC_OR_DEPARTMENT_OTHER): Payer: Medicare Other

## 2016-02-15 ENCOUNTER — Ambulatory Visit (HOSPITAL_BASED_OUTPATIENT_CLINIC_OR_DEPARTMENT_OTHER): Payer: Medicare Other | Admitting: Internal Medicine

## 2016-02-15 ENCOUNTER — Ambulatory Visit (HOSPITAL_BASED_OUTPATIENT_CLINIC_OR_DEPARTMENT_OTHER): Payer: Medicare Other

## 2016-02-15 ENCOUNTER — Encounter: Payer: Self-pay | Admitting: Internal Medicine

## 2016-02-15 VITALS — BP 121/88 | HR 109 | Temp 97.7°F | Resp 20 | Ht 69.0 in | Wt 151.0 lb

## 2016-02-15 DIAGNOSIS — Z5112 Encounter for antineoplastic immunotherapy: Secondary | ICD-10-CM | POA: Diagnosis not present

## 2016-02-15 DIAGNOSIS — C787 Secondary malignant neoplasm of liver and intrahepatic bile duct: Secondary | ICD-10-CM

## 2016-02-15 DIAGNOSIS — C7889 Secondary malignant neoplasm of other digestive organs: Secondary | ICD-10-CM | POA: Diagnosis not present

## 2016-02-15 DIAGNOSIS — C61 Malignant neoplasm of prostate: Secondary | ICD-10-CM

## 2016-02-15 DIAGNOSIS — G893 Neoplasm related pain (acute) (chronic): Secondary | ICD-10-CM

## 2016-02-15 DIAGNOSIS — C3401 Malignant neoplasm of right main bronchus: Secondary | ICD-10-CM

## 2016-02-15 DIAGNOSIS — C3491 Malignant neoplasm of unspecified part of right bronchus or lung: Secondary | ICD-10-CM

## 2016-02-15 DIAGNOSIS — C801 Malignant (primary) neoplasm, unspecified: Principal | ICD-10-CM

## 2016-02-15 DIAGNOSIS — C7951 Secondary malignant neoplasm of bone: Secondary | ICD-10-CM

## 2016-02-15 DIAGNOSIS — Z79899 Other long term (current) drug therapy: Secondary | ICD-10-CM | POA: Diagnosis not present

## 2016-02-15 LAB — CBC WITH DIFFERENTIAL/PLATELET
BASO%: 0.5 % (ref 0.0–2.0)
BASOS ABS: 0.1 10*3/uL (ref 0.0–0.1)
EOS ABS: 0.5 10*3/uL (ref 0.0–0.5)
EOS%: 3.2 % (ref 0.0–7.0)
HEMATOCRIT: 37.1 % — AB (ref 38.4–49.9)
HGB: 11.8 g/dL — ABNORMAL LOW (ref 13.0–17.1)
LYMPH#: 2.4 10*3/uL (ref 0.9–3.3)
LYMPH%: 17.1 % (ref 14.0–49.0)
MCH: 30.4 pg (ref 27.2–33.4)
MCHC: 31.9 g/dL — AB (ref 32.0–36.0)
MCV: 95.4 fL (ref 79.3–98.0)
MONO#: 1.5 10*3/uL — AB (ref 0.1–0.9)
MONO%: 10.3 % (ref 0.0–14.0)
NEUT#: 9.8 10*3/uL — ABNORMAL HIGH (ref 1.5–6.5)
NEUT%: 68.9 % (ref 39.0–75.0)
PLATELETS: 257 10*3/uL (ref 140–400)
RBC: 3.89 10*6/uL — AB (ref 4.20–5.82)
RDW: 17.9 % — ABNORMAL HIGH (ref 11.0–14.6)
WBC: 14.2 10*3/uL — ABNORMAL HIGH (ref 4.0–10.3)

## 2016-02-15 LAB — COMPREHENSIVE METABOLIC PANEL
ALT: 32 U/L (ref 0–55)
ANION GAP: 12 meq/L — AB (ref 3–11)
AST: 23 U/L (ref 5–34)
Albumin: 3.4 g/dL — ABNORMAL LOW (ref 3.5–5.0)
Alkaline Phosphatase: 125 U/L (ref 40–150)
BUN: 52.9 mg/dL — ABNORMAL HIGH (ref 7.0–26.0)
CALCIUM: 9.3 mg/dL (ref 8.4–10.4)
CHLORIDE: 109 meq/L (ref 98–109)
CO2: 17 mEq/L — ABNORMAL LOW (ref 22–29)
CREATININE: 2.5 mg/dL — AB (ref 0.7–1.3)
EGFR: 32 mL/min/{1.73_m2} — AB (ref >90)
Glucose: 86 mg/dl (ref 70–140)
Potassium: 4.3 mEq/L (ref 3.5–5.1)
Sodium: 137 mEq/L (ref 136–145)
Total Bilirubin: <0.3 mg/dL (ref 0.20–1.20)
Total Protein: 8.3 g/dL (ref 6.4–8.3)

## 2016-02-15 LAB — TSH: TSH: 2.041 m[IU]/L (ref 0.320–4.118)

## 2016-02-15 MED ORDER — SODIUM CHLORIDE 0.9 % IV SOLN
240.0000 mg | Freq: Once | INTRAVENOUS | Status: AC
  Administered 2016-02-15: 240 mg via INTRAVENOUS
  Filled 2016-02-15: qty 20

## 2016-02-15 MED ORDER — SODIUM CHLORIDE 0.9 % IJ SOLN
10.0000 mL | INTRAMUSCULAR | Status: DC | PRN
  Administered 2016-02-15: 10 mL
  Filled 2016-02-15: qty 10

## 2016-02-15 MED ORDER — OXYCODONE-ACETAMINOPHEN 5-325 MG PO TABS: 1.0000 | ORAL_TABLET | ORAL | Status: DC | PRN

## 2016-02-15 MED ORDER — HEPARIN SOD (PORK) LOCK FLUSH 100 UNIT/ML IV SOLN
500.0000 [IU] | Freq: Once | INTRAVENOUS | Status: AC | PRN
Start: 2016-02-15 — End: 2016-02-15
  Administered 2016-02-15: 500 [IU]
  Filled 2016-02-15: qty 5

## 2016-02-15 MED ORDER — SODIUM CHLORIDE 0.9 % IV SOLN
Freq: Once | INTRAVENOUS | Status: AC
  Administered 2016-02-15: 14:00:00 via INTRAVENOUS

## 2016-02-15 NOTE — Progress Notes (Signed)
Per Dr. Julien Nordmann pt ok to treat with creatine of 2.5.

## 2016-02-15 NOTE — Patient Instructions (Addendum)
[facility] Discharge Instructions for Patients Receiving Chemotherapy  Today you received the following chemotherapy agents Nivolumab  To help prevent nausea and vomiting after your treatment, we encourage you to take your nausea medication     If you develop nausea and vomiting that is not controlled by your nausea medication, call the clinic.   BELOW ARE SYMPTOMS THAT SHOULD BE REPORTED IMMEDIATELY:  *FEVER GREATER THAN 100.5 F  *CHILLS WITH OR WITHOUT FEVER  NAUSEA AND VOMITING THAT IS NOT CONTROLLED WITH YOUR NAUSEA MEDICATION  *UNUSUAL SHORTNESS OF BREATH  *UNUSUAL BRUISING OR BLEEDING  TENDERNESS IN MOUTH AND THROAT WITH OR WITHOUT PRESENCE OF ULCERS  *URINARY PROBLEMS  *BOWEL PROBLEMS  UNUSUAL RASH Items with * indicate a potential emergency and should be followed up as soon as possible.  Feel free to call the clinic you have any questions or concerns. The clinic phone number is [phone].  Please show the CHEMO ALERT CARD at check-in to the Emergency Department and triage nurse.   

## 2016-02-15 NOTE — Telephone Encounter (Signed)
Gave pt cal & avs °

## 2016-02-15 NOTE — Progress Notes (Signed)
[place] [phone]   [phone]  OFFICE PROGRESS NOTE  Francisco Love, Francisco Heinrich, MD [place] [address]  DIAGNOSIS:  1) Stage IV (T2b, N2, M1b) non-small cell lung cancer, adenocarcinoma with negative EGFR mutation and negative gene translocation presented with a large right hilar mass as well as mediastinal lymphadenopathy and metastatic disease to the liver, bone and pancreas diagnosed in February 2016. 2) prostate adenocarcinoma diagnosed at [facility] with Gleason score 9 (4+5).  PRIOR THERAPY: Systemic chemotherapy with carboplatin for AUC of 5 and Alimta 500 MG/M2 every 3 weeks. Status post 6 cycles.  CURRENT THERAPY:  1) Immunotherapy with Nivolumab 240 MG every 2 weeks, status post 21 cycles. 2) Lupron and Casodex for the recently diagnosed prostate adenocarcinoma at [place]: Francisco Love 55 y.o. male returns to the clinic today for follow-up visit accompanied by his brother-in-law. The patient is doing fine today with no specific complaints. He gained few more pounds since his last visit. The patient continues to tolerate his current treatment with immunotherapy with Nivolumab fairly well. He denied having any specific complaints except for dry skin. He continues to have low back pain as the patient becomes more ambulatory at this point. He denied having any significant nausea or vomiting. The patient denied having any fever or chills, no significant weight loss or night sweats. He has no chest pain, shortness of breath but has mild cough with no hemoptysis. He is here today to start cycle #22 of his treatment.  MEDICAL HISTORY: Past Medical History  Diagnosis Date  . Shortness of breath dyspnea     ALLERGIES:  has No Known Allergies.  MEDICATIONS:  Current Outpatient Prescriptions  Medication Sig Dispense Refill  . acetaminophen (TYLENOL) 325 MG tablet Take 2 tablets (650 mg  total) by mouth every 6 (six) hours as needed for mild pain (or Fever >/= 101).    Marland Kitchen albuterol (PROVENTIL) (2.5 MG/3ML) 0.083% nebulizer solution Take 3 mLs (2.5 mg total) by nebulization every 2 (two) hours as needed for wheezing. 75 mL 12  . AMITIZA 24 MCG capsule     . bicalutamide (CASODEX) 50 MG tablet Take 50 mg by mouth.    . clobetasol ointment (TEMOVATE) 0.05 % Apply topically.    . diphenhydrAMINE (BENADRYL) 25 MG tablet Take 25 mg by mouth.    . doxycycline (ADOXA) 100 MG tablet     . ENSURE PLUS (ENSURE PLUS) LIQD Take by mouth.    . feeding supplement, ENSURE COMPLETE, (ENSURE COMPLETE) LIQD Take 237 mLs by mouth 3 (three) times daily between meals.    Marland Kitchen FLUOCINOLONE ACETONIDE BODY 0.01 % OIL     . levocetirizine (XYZAL) 5 MG tablet Take 5 mg by mouth.    . levofloxacin (LEVAQUIN) 500 MG tablet     . lidocaine-prilocaine (EMLA) cream Apply 1 application topically as needed. Apply to port site prior to chemotherapy. 30 g 0  . mirtazapine (REMERON) 15 MG tablet Take 1 tablet (15 mg total) by mouth at bedtime.    Marland Kitchen oxyCODONE-acetaminophen (PERCOCET/ROXICET) 5-325 MG tablet Take 1 tablet by mouth every 4 (four) hours as needed for severe pain. 60 tablet 0  . polyethylene glycol (MIRALAX / GLYCOLAX) packet Take 17 g by mouth daily.    . prochlorperazine (COMPAZINE) 10 MG tablet Take 1 tablet (10 mg total) by mouth every 6 (six) hours as needed for nausea or vomiting. [name]  tablet 1  . senna-docusate (SENOKOT-S) 8.6-50 MG per tablet Take 1 tablet by mouth daily.    . tamsulosin (FLOMAX) 0.4 MG CAPS capsule     . valACYclovir (VALTREX) 1000 MG tablet     . doxycycline (VIBRA-TABS) 100 MG tablet      No current facility-administered medications for this visit.    SURGICAL HISTORY:  Past Surgical History  Procedure Laterality Date  . Appendectomy    . Video bronchoscopy N/A 09/21/2014    Procedure: VIDEO BRONCHOSCOPY WITH FLUORO;  Surgeon: Rigoberto Noel, MD;  Location: [place];   Service: Cardiopulmonary;  Laterality: N/A;  . Video bronchoscopy with endobronchial ultrasound N/A 09/23/2014    Procedure: VIDEO BRONCHOSCOPY WITH ENDOBRONCHIAL ULTRASOUND;  Surgeon: Rigoberto Noel, MD;  Location: [place];  Service: Pulmonary;  Laterality: N/A;    REVIEW OF SYSTEMS:  A comprehensive review of systems was negative except for: Musculoskeletal: positive for back pain   PHYSICAL EXAMINATION: General appearance: alert, cooperative, fatigued and no distress Head: Normocephalic, without obvious abnormality, atraumatic Neck: no adenopathy, no JVD, supple, symmetrical, trachea midline and thyroid not enlarged, symmetric, no tenderness/mass/nodules Lymph nodes: Cervical, supraclavicular, and axillary nodes normal. Resp: clear to auscultation bilaterally Back: symmetric, no curvature. ROM normal. No CVA tenderness. Cardio: regular rate and rhythm, S1, S2 normal, no murmur, click, rub or gallop GI: soft, non-tender; bowel sounds normal; no masses,  no organomegaly Extremities: extremities normal, atraumatic, no cyanosis or edema Neurologic: Alert and oriented X 3, normal strength and tone. Normal symmetric reflexes. Normal coordination and gait  ECOG PERFORMANCE STATUS: 1 - Symptomatic but completely ambulatory  Blood pressure 121/88, pulse 109, temperature 97.7 F (36.5 C), temperature source Oral, resp. rate 20, height _0  (1.753 m), weight 151 lb (68.493 kg), SpO2 98 %.  LABORATORY DATA: Lab Results  Component Value Date   WBC 14.2* 02/15/2016   HGB 11.8* 02/15/2016   HCT 37.1* 02/15/2016   MCV 95.4 02/15/2016   PLT 257 02/15/2016      Chemistry      Component Value Date/Time   NA 135* 02/01/2016 0853   NA 135 10/25/2014 0845   K 5.1 02/01/2016 0853   K 5.3* 10/25/2014 0845   CL 108 10/25/2014 0845   CO2 16* 02/01/2016 0853   CO2 19 10/25/2014 0845   BUN 62.7* 02/01/2016 0853   BUN 22 10/25/2014 0845   CREATININE 3.2* 02/01/2016 0853   CREATININE 1.03 10/25/2014  0845      Component Value Date/Time   CALCIUM 9.5 02/01/2016 0853   CALCIUM 8.6 10/25/2014 0845   ALKPHOS 119 02/01/2016 0853   ALKPHOS 882* 09/20/2014 1120   AST 18 02/01/2016 0853   AST 29 09/20/2014 1120   ALT 16 02/01/2016 0853   ALT 34 09/20/2014 1120   BILITOT <0.30 02/01/2016 0853   BILITOT 0.1* 09/20/2014 1120       RADIOGRAPHIC STUDIES: Ct Abdomen Pelvis Wo Contrast  02/01/2016  CLINICAL DATA:  55 year old male with history of lung cancer diagnosed October 2016 with known bone metastasis. Chemotherapy and process. EXAM: CT CHEST, ABDOMEN AND PELVIS WITHOUT CONTRAST TECHNIQUE: Multidetector CT imaging of the chest, abdomen and pelvis was performed following the standard protocol without IV contrast. COMPARISON:  CT the chest, abdomen and pelvis 12/07/2015. FINDINGS: CT CHEST FINDINGS Mediastinum/Lymph Nodes: Heart size is normal. There is no significant pericardial fluid, thickening or pericardial calcification. There is atherosclerosis of the thoracic aorta, the great vessels of the mediastinum and the coronary arteries,  including calcified atherosclerotic plaque in the left anterior descending coronary arteries. Again noted is a conglomerate nodal mass in the right paratracheal nodal station extending to involve the right hilar region was well level which is partially calcified and measures up to 4.0 x 2.9 cm (image 19 of series 2) which is similar to prior examination. No other new mediastinal or hilar lymphadenopathy is noted on today's noncontrast CT examination. Esophagus is unremarkable in appearance. No axillary lymphadenopathy. Right internal jugular single-lumen porta cath with tip terminating in the right atrium. Lungs/Pleura: Mild diffuse ground-glass attenuation. Mild interlobular septal thickening, most evident in the lung bases. Trace amount of pleural fluid and/or pleural thickening dependently bilaterally, similar to the prior study. No confluent consolidative airspace  disease. Focal area of architectural distortion around a calcified granuloma in the left upper lobe again noted. No other suspicious appearing pulmonary nodules or masses. Mild diffuse bronchial wall thickening with mild centrilobular and paraseptal emphysema. Musculoskeletal/Soft Tissues: Innumerable predominantly sclerotic lesions are again noted throughout the axial and appendicular skeleton, very similar in size, number and distribution compared to the prior study, compatible with widespread metastatic disease to the bones. This is most confluent within the T11 vertebral body which is completely infiltrated, similar to the prior study. No pathologic compression fractures are noted at this time within the thoracic spine. CT ABDOMEN AND PELVIS FINDINGS Hepatobiliary: No definite cystic or solid hepatic lesions are identified on today's noncontrast CT examination. Gallbladder is moderately distended, but there are no definite calcified gallstones and there are no surrounding inflammatory changes. Pancreas: Calcifications are again noted associated with the head of the pancreas. Poorly defined low-attenuation area in the head of the pancreas is similar to prior studies measuring approximately 1.4 x 1.8 cm on today's examination (image 67 of series 2), incompletely characterized. There does appear to be some pancreatic ductal dilatation, with the main pancreatic duct measuring up to 4 mm in the head and proximal body of the pancreas. No peripancreatic inflammatory changes. Spleen: Unremarkable. Adrenals/Urinary Tract: Unenhanced appearance of the kidneys and bilateral adrenal glands is unremarkable. There is no hydroureteronephrosis. Urinary bladder is normal in appearance. Stomach/Bowel: Unenhanced appearance of the stomach is normal. No pathologic dilatation of small bowel or colon. The appendix is not confidently identified may be surgically absent. Regardless, there are no inflammatory changes noted adjacent to  the cecum to suggest the presence of an acute appendicitis at this time. Vascular/Lymphatic: Extensive atherosclerotic calcifications throughout the abdominal and pelvic vasculature, without evidence of aneurysm. No definite lymphadenopathy noted in the abdomen or pelvis on today's noncontrast CT examination. Reproductive: Prostate gland and seminal vesicles are unremarkable in appearance. Other: No significant volume of ascites.  No pneumoperitoneum. Musculoskeletal: Widespread predominantly sclerotic lesions are again noted throughout all aspects of the axial and appendicular skeleton, compatible with widespread metastatic disease to the bones. This is most confluent in the left ilium above the left acetabulum. Femoral necks appear relatively spared, there is no pathologic fracture of either femur identified on today's examination. Flattening of the femoral heads bilaterally, likely related to avascular necrosis. Severe joint space narrowing, subchondral sclerosis, subchondral cyst formation and osteophyte formation in the hip joints bilaterally, compatible with advanced osteoarthritis. IMPRESSION: 1. Today's study demonstrates stable bulky partially calcified nodal mass in the right paratracheal and right hilar nodal stations, as well as widespread metastatic disease to the bones which appears essentially unchanged compared to the prior examinations. No other extra skeletal metastatic disease identified in the chest, abdomen or pelvis.  2. Mild diffuse ground-glass attenuation and septal thickening in the lungs bilaterally which is similar in retrospect compared to the prior study. This appearance is typical for pulmonary edema, however, given the lack of cardiomegaly, noncardiogenic causes should be considered. Trace bilateral pleural effusions and chronic bilateral pleural thickening are similar to the prior study. 3. Ill-defined and cystic appearing region in the pancreatic head is similar to numerous prior  examinations, and incompletely characterized on today's noncontrast CT examination. Given the presence of pancreatic calcifications in the pancreatic head, this is favored to represent a small pseudocyst in the setting of chronic pancreatitis. Mild pancreatic ductal dilatation is also similar to prior examinations. 4. Additional incidental findings, as above, similar to the prior examinations. Electronically Signed   By: Vinnie Langton M.D.   On: 02/01/2016 09:42   Ct Chest Wo Contrast  02/01/2016  CLINICAL DATA:  55 year old male with history of lung cancer diagnosed October 2016 with known bone metastasis. Chemotherapy and process. EXAM: CT CHEST, ABDOMEN AND PELVIS WITHOUT CONTRAST TECHNIQUE: Multidetector CT imaging of the chest, abdomen and pelvis was performed following the standard protocol without IV contrast. COMPARISON:  CT the chest, abdomen and pelvis 12/07/2015. FINDINGS: CT CHEST FINDINGS Mediastinum/Lymph Nodes: Heart size is normal. There is no significant pericardial fluid, thickening or pericardial calcification. There is atherosclerosis of the thoracic aorta, the great vessels of the mediastinum and the coronary arteries, including calcified atherosclerotic plaque in the left anterior descending coronary arteries. Again noted is a conglomerate nodal mass in the right paratracheal nodal station extending to involve the right hilar region was well level which is partially calcified and measures up to 4.0 x 2.9 cm (image 19 of series 2) which is similar to prior examination. No other new mediastinal or hilar lymphadenopathy is noted on today's noncontrast CT examination. Esophagus is unremarkable in appearance. No axillary lymphadenopathy. Right internal jugular single-lumen porta cath with tip terminating in the right atrium. Lungs/Pleura: Mild diffuse ground-glass attenuation. Mild interlobular septal thickening, most evident in the lung bases. Trace amount of pleural fluid and/or pleural  thickening dependently bilaterally, similar to the prior study. No confluent consolidative airspace disease. Focal area of architectural distortion around a calcified granuloma in the left upper lobe again noted. No other suspicious appearing pulmonary nodules or masses. Mild diffuse bronchial wall thickening with mild centrilobular and paraseptal emphysema. Musculoskeletal/Soft Tissues: Innumerable predominantly sclerotic lesions are again noted throughout the axial and appendicular skeleton, very similar in size, number and distribution compared to the prior study, compatible with widespread metastatic disease to the bones. This is most confluent within the T11 vertebral body which is completely infiltrated, similar to the prior study. No pathologic compression fractures are noted at this time within the thoracic spine. CT ABDOMEN AND PELVIS FINDINGS Hepatobiliary: No definite cystic or solid hepatic lesions are identified on today's noncontrast CT examination. Gallbladder is moderately distended, but there are no definite calcified gallstones and there are no surrounding inflammatory changes. Pancreas: Calcifications are again noted associated with the head of the pancreas. Poorly defined low-attenuation area in the head of the pancreas is similar to prior studies measuring approximately 1.4 x 1.8 cm on today's examination (image 67 of series 2), incompletely characterized. There does appear to be some pancreatic ductal dilatation, with the main pancreatic duct measuring up to 4 mm in the head and proximal body of the pancreas. No peripancreatic inflammatory changes. Spleen: Unremarkable. Adrenals/Urinary Tract: Unenhanced appearance of the kidneys and bilateral adrenal glands is unremarkable. There is  no hydroureteronephrosis. Urinary bladder is normal in appearance. Stomach/Bowel: Unenhanced appearance of the stomach is normal. No pathologic dilatation of small bowel or colon. The appendix is not confidently  identified may be surgically absent. Regardless, there are no inflammatory changes noted adjacent to the cecum to suggest the presence of an acute appendicitis at this time. Vascular/Lymphatic: Extensive atherosclerotic calcifications throughout the abdominal and pelvic vasculature, without evidence of aneurysm. No definite lymphadenopathy noted in the abdomen or pelvis on today's noncontrast CT examination. Reproductive: Prostate gland and seminal vesicles are unremarkable in appearance. Other: No significant volume of ascites.  No pneumoperitoneum. Musculoskeletal: Widespread predominantly sclerotic lesions are again noted throughout all aspects of the axial and appendicular skeleton, compatible with widespread metastatic disease to the bones. This is most confluent in the left ilium above the left acetabulum. Femoral necks appear relatively spared, there is no pathologic fracture of either femur identified on today's examination. Flattening of the femoral heads bilaterally, likely related to avascular necrosis. Severe joint space narrowing, subchondral sclerosis, subchondral cyst formation and osteophyte formation in the hip joints bilaterally, compatible with advanced osteoarthritis. IMPRESSION: 1. Today's study demonstrates stable bulky partially calcified nodal mass in the right paratracheal and right hilar nodal stations, as well as widespread metastatic disease to the bones which appears essentially unchanged compared to the prior examinations. No other extra skeletal metastatic disease identified in the chest, abdomen or pelvis. 2. Mild diffuse ground-glass attenuation and septal thickening in the lungs bilaterally which is similar in retrospect compared to the prior study. This appearance is typical for pulmonary edema, however, given the lack of cardiomegaly, noncardiogenic causes should be considered. Trace bilateral pleural effusions and chronic bilateral pleural thickening are similar to the prior  study. 3. Ill-defined and cystic appearing region in the pancreatic head is similar to numerous prior examinations, and incompletely characterized on today's noncontrast CT examination. Given the presence of pancreatic calcifications in the pancreatic head, this is favored to represent a small pseudocyst in the setting of chronic pancreatitis. Mild pancreatic ductal dilatation is also similar to prior examinations. 4. Additional incidental findings, as above, similar to the prior examinations. Electronically Signed   By: Vinnie Langton M.D.   On: 02/01/2016 09:42   ASSESSMENT AND PLAN: This is a very pleasant 55 years old African-American male with:  1)  metastatic non-small cell lung cancer, adenocarcinoma status post induction chemotherapy with carboplatin and Alimta with initial good response followed by disease progression. The patient is currently on second line treatment with immunotherapy with Nivolumab status post 21 cycles. He tolerated the last cycle of his treatment fairly well with no concerning adverse effects.  I recommended for him to continue his treatment with immunotherapy with cycle #21 today as scheduled.  2) recently diagnosed prostate adenocarcinoma: He is followed at [facility] for this problem. He is currently on hormonal therapy with Lupron and Casodex. He is scheduled for the next injection on 03/15/2016.  3) pain management, he would continue on Percocet as needed. He was given a refill of Percocet today.  4) metastatic bone disease, he will be treated with Xgeva after receive dental clearance. He is still reluctant about new treatment. The patient would come back for follow-up visit in 2 weeks for reevaluation before starting cycle #23.  He was advised to call immediately if he has any concerning symptoms in the interval. The patient voices understanding of current disease status and treatment options and is in agreement with the current care plan.  All  questions  were answered. The patient knows to call the clinic with any problems, questions or concerns. We can certainly see the patient much sooner if necessary.  Disclaimer: This note was dictated with voice recognition software. Similar sounding words can inadvertently be transcribed and may not be corrected upon review.

## 2016-02-29 ENCOUNTER — Encounter: Payer: Self-pay | Admitting: Oncology

## 2016-02-29 ENCOUNTER — Ambulatory Visit (HOSPITAL_BASED_OUTPATIENT_CLINIC_OR_DEPARTMENT_OTHER): Payer: Medicare Other

## 2016-02-29 ENCOUNTER — Other Ambulatory Visit (HOSPITAL_BASED_OUTPATIENT_CLINIC_OR_DEPARTMENT_OTHER): Payer: Medicare Other

## 2016-02-29 ENCOUNTER — Ambulatory Visit (HOSPITAL_BASED_OUTPATIENT_CLINIC_OR_DEPARTMENT_OTHER): Payer: Medicare Other | Admitting: Oncology

## 2016-02-29 VITALS — BP 126/100 | HR 95 | Temp 98.2°F | Resp 18 | Ht 69.0 in | Wt 152.5 lb

## 2016-02-29 DIAGNOSIS — C3401 Malignant neoplasm of right main bronchus: Secondary | ICD-10-CM | POA: Diagnosis present

## 2016-02-29 DIAGNOSIS — C7951 Secondary malignant neoplasm of bone: Secondary | ICD-10-CM

## 2016-02-29 DIAGNOSIS — C801 Malignant (primary) neoplasm, unspecified: Secondary | ICD-10-CM

## 2016-02-29 DIAGNOSIS — Z5112 Encounter for antineoplastic immunotherapy: Secondary | ICD-10-CM

## 2016-02-29 DIAGNOSIS — C61 Malignant neoplasm of prostate: Secondary | ICD-10-CM | POA: Diagnosis not present

## 2016-02-29 DIAGNOSIS — G893 Neoplasm related pain (acute) (chronic): Secondary | ICD-10-CM | POA: Diagnosis not present

## 2016-02-29 DIAGNOSIS — C787 Secondary malignant neoplasm of liver and intrahepatic bile duct: Secondary | ICD-10-CM | POA: Diagnosis not present

## 2016-02-29 DIAGNOSIS — C3491 Malignant neoplasm of unspecified part of right bronchus or lung: Secondary | ICD-10-CM

## 2016-02-29 DIAGNOSIS — C7889 Secondary malignant neoplasm of other digestive organs: Secondary | ICD-10-CM

## 2016-02-29 LAB — CBC WITH DIFFERENTIAL/PLATELET
BASO%: 0.6 % (ref 0.0–2.0)
Basophils Absolute: 0.1 10*3/uL (ref 0.0–0.1)
EOS ABS: 0.2 10*3/uL (ref 0.0–0.5)
EOS%: 2.2 % (ref 0.0–7.0)
HCT: 36.9 % — ABNORMAL LOW (ref 38.4–49.9)
HEMOGLOBIN: 12 g/dL — AB (ref 13.0–17.1)
LYMPH%: 19.1 % (ref 14.0–49.0)
MCH: 31.1 pg (ref 27.2–33.4)
MCHC: 32.4 g/dL (ref 32.0–36.0)
MCV: 96 fL (ref 79.3–98.0)
MONO#: 0.9 10*3/uL (ref 0.1–0.9)
MONO%: 9.7 % (ref 0.0–14.0)
NEUT%: 68.4 % (ref 39.0–75.0)
NEUTROS ABS: 6.7 10*3/uL — AB (ref 1.5–6.5)
Platelets: 318 10*3/uL (ref 140–400)
RBC: 3.84 10*6/uL — AB (ref 4.20–5.82)
RDW: 17.8 % — AB (ref 11.0–14.6)
WBC: 9.8 10*3/uL (ref 4.0–10.3)
lymph#: 1.9 10*3/uL (ref 0.9–3.3)

## 2016-02-29 LAB — COMPREHENSIVE METABOLIC PANEL
ALBUMIN: 3.2 g/dL — AB (ref 3.5–5.0)
ALT: 28 U/L (ref 0–55)
ANION GAP: 10 meq/L (ref 3–11)
AST: 21 U/L (ref 5–34)
Alkaline Phosphatase: 126 U/L (ref 40–150)
BUN: 43 mg/dL — ABNORMAL HIGH (ref 7.0–26.0)
CALCIUM: 9.3 mg/dL (ref 8.4–10.4)
CHLORIDE: 111 meq/L — AB (ref 98–109)
CO2: 16 mEq/L — ABNORMAL LOW (ref 22–29)
Creatinine: 2.9 mg/dL — ABNORMAL HIGH (ref 0.7–1.3)
EGFR: 27 mL/min/{1.73_m2} — ABNORMAL LOW (ref >90)
Glucose: 98 mg/dl (ref 70–140)
POTASSIUM: 4.7 meq/L (ref 3.5–5.1)
SODIUM: 137 meq/L (ref 136–145)
TOTAL PROTEIN: 8.2 g/dL (ref 6.4–8.3)
Total Bilirubin: <0.3 mg/dL (ref 0.20–1.20)

## 2016-02-29 MED ORDER — SODIUM CHLORIDE 0.9 % IJ SOLN
10.0000 mL | INTRAMUSCULAR | Status: DC | PRN
  Administered 2016-02-29: 10 mL
  Filled 2016-02-29: qty 10

## 2016-02-29 MED ORDER — SODIUM CHLORIDE 0.9 % IV SOLN
240.0000 mg | Freq: Once | INTRAVENOUS | Status: AC
  Administered 2016-02-29: 240 mg via INTRAVENOUS
  Filled 2016-02-29: qty 8

## 2016-02-29 MED ORDER — SODIUM CHLORIDE 0.9 % IV SOLN
Freq: Once | INTRAVENOUS | Status: AC
  Administered 2016-02-29: 10:00:00 via INTRAVENOUS

## 2016-02-29 MED ORDER — OXYCODONE-ACETAMINOPHEN 5-325 MG PO TABS: 1.0000 | ORAL_TABLET | ORAL | Status: DC | PRN

## 2016-02-29 MED ORDER — HEPARIN SOD (PORK) LOCK FLUSH 100 UNIT/ML IV SOLN
500.0000 [IU] | Freq: Once | INTRAVENOUS | Status: AC | PRN
  Administered 2016-02-29: 500 [IU]
  Filled 2016-02-29: qty 5

## 2016-02-29 NOTE — Progress Notes (Signed)
Dr. Alvy Bimler notified of elevated creatinine. She advised ok to treat.

## 2016-02-29 NOTE — Progress Notes (Signed)
[place] [phone]   [phone]  OFFICE PROGRESS NOTE  [name], [name], MD [place] [id number]  DIAGNOSIS:  1) Stage IV (T2b, N2, M1b) non-small cell lung cancer, adenocarcinoma with negative EGFR mutation and negative gene translocation presented with a large right hilar mass as well as mediastinal lymphadenopathy and metastatic disease to the liver, bone and pancreas diagnosed in February 2016. 2) prostate adenocarcinoma diagnosed at [facility] with Gleason score 9 (4+5).  PRIOR THERAPY: Systemic chemotherapy with carboplatin for AUC of 5 and Alimta 500 MG/M2 every 3 weeks. Status post 6 cycles.  CURRENT THERAPY:  1) Immunotherapy with Nivolumab 240 MG every 2 weeks, status post 22 cycles. 2) Lupron and Casodex for the recently diagnosed prostate adenocarcinoma at [place]: Francisco Love 55 y.o. male returns to the clinic today for follow-up visit accompanied by his brother-in-law. The patient is doing fine today with no specific complaints. He gained more weight since his last visit. The patient continues to tolerate his current treatment with immunotherapy with Nivolumab fairly well. He denied having any specific complaints except for dry skin. He continues to have low back and bilateral hip pain as the patient becomes more ambulatory at this point. Using Percocet 3-4 times per day on average. He denied having any significant nausea or vomiting. The patient denied having any fever or chills, no significant weight loss or night sweats. He has no chest pain, shortness of breath but has mild cough with no hemoptysis. He is here today to start cycle #23 of his treatment.  MEDICAL HISTORY: Past Medical History  Diagnosis Date  . Shortness of breath dyspnea     ALLERGIES:  has No Known Allergies.  MEDICATIONS:  Current Outpatient Prescriptions  Medication Sig Dispense Refill  .  acetaminophen (TYLENOL) 325 MG tablet Take 2 tablets (650 mg total) by mouth every 6 (six) hours as needed for mild pain (or Fever >/= 101).    Marland Kitchen albuterol (PROVENTIL) (2.5 MG/3ML) 0.083% nebulizer solution Take 3 mLs (2.5 mg total) by nebulization every 2 (two) hours as needed for wheezing. 75 mL 12  . AMITIZA 24 MCG capsule     . bicalutamide (CASODEX) 50 MG tablet Take 50 mg by mouth.    . clobetasol ointment (TEMOVATE) 0.05 % Apply topically.    . diphenhydrAMINE (BENADRYL) 25 MG tablet Take 25 mg by mouth.    . doxycycline (ADOXA) 100 MG tablet     . doxycycline (VIBRA-TABS) 100 MG tablet     . ENSURE PLUS (ENSURE PLUS) LIQD Take by mouth.    . feeding supplement, ENSURE COMPLETE, (ENSURE COMPLETE) LIQD Take 237 mLs by mouth 3 (three) times daily between meals.    Marland Kitchen FLUOCINOLONE ACETONIDE BODY 0.01 % OIL     . levocetirizine (XYZAL) 5 MG tablet Take 5 mg by mouth.    . levofloxacin (LEVAQUIN) 500 MG tablet     . lidocaine-prilocaine (EMLA) cream Apply 1 application topically as needed. Apply to port site prior to chemotherapy. 30 g 0  . mirtazapine (REMERON) 15 MG tablet Take 1 tablet (15 mg total) by mouth at bedtime.    Marland Kitchen oxyCODONE-acetaminophen (PERCOCET/ROXICET) 5-325 MG tablet Take 1 tablet by mouth every 4 (four) hours as needed for severe pain. 60 tablet 0  . polyethylene glycol (MIRALAX / GLYCOLAX) packet Take 17 g by mouth daily.    . prochlorperazine (COMPAZINE) 10 MG  tablet Take 1 tablet (10 mg total) by mouth every 6 (six) hours as needed for nausea or vomiting. 30 tablet 1  . senna-docusate (SENOKOT-S) 8.6-50 MG per tablet Take 1 tablet by mouth daily.    . tamsulosin (FLOMAX) 0.4 MG CAPS capsule     . valACYclovir (VALTREX) 1000 MG tablet      No current facility-administered medications for this visit.    SURGICAL HISTORY:  Past Surgical History  Procedure Laterality Date  . Appendectomy    . Video bronchoscopy N/A 09/21/2014    Procedure: VIDEO BRONCHOSCOPY WITH  FLUORO;  Surgeon: Rigoberto Noel, MD;  Location: [facility];  Service: Cardiopulmonary;  Laterality: N/A;  . Video bronchoscopy with endobronchial ultrasound N/A 09/23/2014    Procedure: VIDEO BRONCHOSCOPY WITH ENDOBRONCHIAL ULTRASOUND;  Surgeon: Rigoberto Noel, MD;  Location: [facility];  Service: Pulmonary;  Laterality: N/A;    REVIEW OF SYSTEMS:  A comprehensive review of systems was negative except for: Musculoskeletal: positive for back pain   PHYSICAL EXAMINATION: General appearance: alert, cooperative, fatigued and no distress Head: Normocephalic, without obvious abnormality, atraumatic Neck: no adenopathy, no JVD, supple, symmetrical, trachea midline and thyroid not enlarged, symmetric, no tenderness/mass/nodules Lymph nodes: Cervical, supraclavicular, and axillary nodes normal. Resp: clear to auscultation bilaterally Back: symmetric, no curvature. ROM normal. No CVA tenderness. Cardio: regular rate and rhythm, S1, S2 normal, no murmur, click, rub or gallop GI: soft, non-tender; bowel sounds normal; no masses,  no organomegaly Extremities: extremities normal, atraumatic, no cyanosis or edema Neurologic: Alert and oriented X 3, normal strength and tone. Normal symmetric reflexes. Normal coordination and gait  ECOG PERFORMANCE STATUS: 1 - Symptomatic but completely ambulatory  Blood pressure 126/100, pulse 95, temperature 98.2 F (36.8 C), temperature source Oral, resp. rate 18, height _0  (1.753 m), weight 152 lb 8 oz (69.174 kg), SpO2 95 %.  LABORATORY DATA: Lab Results  Component Value Date   WBC 9.8 02/29/2016   HGB 12.0* 02/29/2016   HCT 36.9* 02/29/2016   MCV 96.0 02/29/2016   PLT 318 02/29/2016      Chemistry      Component Value Date/Time   NA 137 02/29/2016 0837   NA 135 10/25/2014 0845   K 4.7 02/29/2016 0837   K 5.3* 10/25/2014 0845   CL 108 10/25/2014 0845   CO2 16* 02/29/2016 0837   CO2 19 10/25/2014 0845   BUN 43.0* 02/29/2016 0837   BUN 22 10/25/2014 0845     CREATININE 2.9* 02/29/2016 0837   CREATININE 1.03 10/25/2014 0845      Component Value Date/Time   CALCIUM 9.3 02/29/2016 0837   CALCIUM 8.6 10/25/2014 0845   ALKPHOS 126 02/29/2016 0837   ALKPHOS 882* 09/20/2014 1120   AST 21 02/29/2016 0837   AST 29 09/20/2014 1120   ALT 28 02/29/2016 0837   ALT 34 09/20/2014 1120   BILITOT <0.30 02/29/2016 0837   BILITOT 0.1* 09/20/2014 1120       RADIOGRAPHIC STUDIES: Ct Abdomen Pelvis Wo Contrast  02/01/2016  CLINICAL DATA:  55 year old male with history of lung cancer diagnosed October 2016 with known bone metastasis. Chemotherapy and process. EXAM: CT CHEST, ABDOMEN AND PELVIS WITHOUT CONTRAST TECHNIQUE: Multidetector CT imaging of the chest, abdomen and pelvis was performed following the standard protocol without IV contrast. COMPARISON:  CT the chest, abdomen and pelvis 12/07/2015. FINDINGS: CT CHEST FINDINGS Mediastinum/Lymph Nodes: Heart size is normal. There is no significant pericardial fluid, thickening or pericardial calcification. There is atherosclerosis of  the thoracic aorta, the great vessels of the mediastinum and the coronary arteries, including calcified atherosclerotic plaque in the left anterior descending coronary arteries. Again noted is a conglomerate nodal mass in the right paratracheal nodal station extending to involve the right hilar region was well level which is partially calcified and measures up to 4.0 x 2.9 cm (image 19 of series 2) which is similar to prior examination. No other new mediastinal or hilar lymphadenopathy is noted on today's noncontrast CT examination. Esophagus is unremarkable in appearance. No axillary lymphadenopathy. Right internal jugular single-lumen porta cath with tip terminating in the right atrium. Lungs/Pleura: Mild diffuse ground-glass attenuation. Mild interlobular septal thickening, most evident in the lung bases. Trace amount of pleural fluid and/or pleural thickening dependently bilaterally,  similar to the prior study. No confluent consolidative airspace disease. Focal area of architectural distortion around a calcified granuloma in the left upper lobe again noted. No other suspicious appearing pulmonary nodules or masses. Mild diffuse bronchial wall thickening with mild centrilobular and paraseptal emphysema. Musculoskeletal/Soft Tissues: Innumerable predominantly sclerotic lesions are again noted throughout the axial and appendicular skeleton, very similar in size, number and distribution compared to the prior study, compatible with widespread metastatic disease to the bones. This is most confluent within the T11 vertebral body which is completely infiltrated, similar to the prior study. No pathologic compression fractures are noted at this time within the thoracic spine. CT ABDOMEN AND PELVIS FINDINGS Hepatobiliary: No definite cystic or solid hepatic lesions are identified on today's noncontrast CT examination. Gallbladder is moderately distended, but there are no definite calcified gallstones and there are no surrounding inflammatory changes. Pancreas: Calcifications are again noted associated with the head of the pancreas. Poorly defined low-attenuation area in the head of the pancreas is similar to prior studies measuring approximately 1.4 x 1.8 cm on today's examination (image 67 of series 2), incompletely characterized. There does appear to be some pancreatic ductal dilatation, with the main pancreatic duct measuring up to 4 mm in the head and proximal body of the pancreas. No peripancreatic inflammatory changes. Spleen: Unremarkable. Adrenals/Urinary Tract: Unenhanced appearance of the kidneys and bilateral adrenal glands is unremarkable. There is no hydroureteronephrosis. Urinary bladder is normal in appearance. Stomach/Bowel: Unenhanced appearance of the stomach is normal. No pathologic dilatation of small bowel or colon. The appendix is not confidently identified may be surgically absent.  Regardless, there are no inflammatory changes noted adjacent to the cecum to suggest the presence of an acute appendicitis at this time. Vascular/Lymphatic: Extensive atherosclerotic calcifications throughout the abdominal and pelvic vasculature, without evidence of aneurysm. No definite lymphadenopathy noted in the abdomen or pelvis on today's noncontrast CT examination. Reproductive: Prostate gland and seminal vesicles are unremarkable in appearance. Other: No significant volume of ascites.  No pneumoperitoneum. Musculoskeletal: Widespread predominantly sclerotic lesions are again noted throughout all aspects of the axial and appendicular skeleton, compatible with widespread metastatic disease to the bones. This is most confluent in the left ilium above the left acetabulum. Femoral necks appear relatively spared, there is no pathologic fracture of either femur identified on today's examination. Flattening of the femoral heads bilaterally, likely related to avascular necrosis. Severe joint space narrowing, subchondral sclerosis, subchondral cyst formation and osteophyte formation in the hip joints bilaterally, compatible with advanced osteoarthritis. IMPRESSION: 1. Today's study demonstrates stable bulky partially calcified nodal mass in the right paratracheal and right hilar nodal stations, as well as widespread metastatic disease to the bones which appears essentially unchanged compared to the prior examinations.  No other extra skeletal metastatic disease identified in the chest, abdomen or pelvis. 2. Mild diffuse ground-glass attenuation and septal thickening in the lungs bilaterally which is similar in retrospect compared to the prior study. This appearance is typical for pulmonary edema, however, given the lack of cardiomegaly, noncardiogenic causes should be considered. Trace bilateral pleural effusions and chronic bilateral pleural thickening are similar to the prior study. 3. Ill-defined and cystic  appearing region in the pancreatic head is similar to numerous prior examinations, and incompletely characterized on today's noncontrast CT examination. Given the presence of pancreatic calcifications in the pancreatic head, this is favored to represent a small pseudocyst in the setting of chronic pancreatitis. Mild pancreatic ductal dilatation is also similar to prior examinations. 4. Additional incidental findings, as above, similar to the prior examinations. Electronically Signed   By: Vinnie Langton M.D.   On: 02/01/2016 09:42   Ct Chest Wo Contrast  02/01/2016  CLINICAL DATA:  55 year old male with history of lung cancer diagnosed October 2016 with known bone metastasis. Chemotherapy and process. EXAM: CT CHEST, ABDOMEN AND PELVIS WITHOUT CONTRAST TECHNIQUE: Multidetector CT imaging of the chest, abdomen and pelvis was performed following the standard protocol without IV contrast. COMPARISON:  CT the chest, abdomen and pelvis 12/07/2015. FINDINGS: CT CHEST FINDINGS Mediastinum/Lymph Nodes: Heart size is normal. There is no significant pericardial fluid, thickening or pericardial calcification. There is atherosclerosis of the thoracic aorta, the great vessels of the mediastinum and the coronary arteries, including calcified atherosclerotic plaque in the left anterior descending coronary arteries. Again noted is a conglomerate nodal mass in the right paratracheal nodal station extending to involve the right hilar region was well level which is partially calcified and measures up to 4.0 x 2.9 cm (image 19 of series 2) which is similar to prior examination. No other new mediastinal or hilar lymphadenopathy is noted on today's noncontrast CT examination. Esophagus is unremarkable in appearance. No axillary lymphadenopathy. Right internal jugular single-lumen porta cath with tip terminating in the right atrium. Lungs/Pleura: Mild diffuse ground-glass attenuation. Mild interlobular septal thickening, most evident  in the lung bases. Trace amount of pleural fluid and/or pleural thickening dependently bilaterally, similar to the prior study. No confluent consolidative airspace disease. Focal area of architectural distortion around a calcified granuloma in the left upper lobe again noted. No other suspicious appearing pulmonary nodules or masses. Mild diffuse bronchial wall thickening with mild centrilobular and paraseptal emphysema. Musculoskeletal/Soft Tissues: Innumerable predominantly sclerotic lesions are again noted throughout the axial and appendicular skeleton, very similar in size, number and distribution compared to the prior study, compatible with widespread metastatic disease to the bones. This is most confluent within the T11 vertebral body which is completely infiltrated, similar to the prior study. No pathologic compression fractures are noted at this time within the thoracic spine. CT ABDOMEN AND PELVIS FINDINGS Hepatobiliary: No definite cystic or solid hepatic lesions are identified on today's noncontrast CT examination. Gallbladder is moderately distended, but there are no definite calcified gallstones and there are no surrounding inflammatory changes. Pancreas: Calcifications are again noted associated with the head of the pancreas. Poorly defined low-attenuation area in the head of the pancreas is similar to prior studies measuring approximately 1.4 x 1.8 cm on today's examination (image 67 of series 2), incompletely characterized. There does appear to be some pancreatic ductal dilatation, with the main pancreatic duct measuring up to 4 mm in the head and proximal body of the pancreas. No peripancreatic inflammatory changes. Spleen: Unremarkable. Adrenals/Urinary Tract:  Unenhanced appearance of the kidneys and bilateral adrenal glands is unremarkable. There is no hydroureteronephrosis. Urinary bladder is normal in appearance. Stomach/Bowel: Unenhanced appearance of the stomach is normal. No pathologic  dilatation of small bowel or colon. The appendix is not confidently identified may be surgically absent. Regardless, there are no inflammatory changes noted adjacent to the cecum to suggest the presence of an acute appendicitis at this time. Vascular/Lymphatic: Extensive atherosclerotic calcifications throughout the abdominal and pelvic vasculature, without evidence of aneurysm. No definite lymphadenopathy noted in the abdomen or pelvis on today's noncontrast CT examination. Reproductive: Prostate gland and seminal vesicles are unremarkable in appearance. Other: No significant volume of ascites.  No pneumoperitoneum. Musculoskeletal: Widespread predominantly sclerotic lesions are again noted throughout all aspects of the axial and appendicular skeleton, compatible with widespread metastatic disease to the bones. This is most confluent in the left ilium above the left acetabulum. Femoral necks appear relatively spared, there is no pathologic fracture of either femur identified on today's examination. Flattening of the femoral heads bilaterally, likely related to avascular necrosis. Severe joint space narrowing, subchondral sclerosis, subchondral cyst formation and osteophyte formation in the hip joints bilaterally, compatible with advanced osteoarthritis. IMPRESSION: 1. Today's study demonstrates stable bulky partially calcified nodal mass in the right paratracheal and right hilar nodal stations, as well as widespread metastatic disease to the bones which appears essentially unchanged compared to the prior examinations. No other extra skeletal metastatic disease identified in the chest, abdomen or pelvis. 2. Mild diffuse ground-glass attenuation and septal thickening in the lungs bilaterally which is similar in retrospect compared to the prior study. This appearance is typical for pulmonary edema, however, given the lack of cardiomegaly, noncardiogenic causes should be considered. Trace bilateral pleural effusions  and chronic bilateral pleural thickening are similar to the prior study. 3. Ill-defined and cystic appearing region in the pancreatic head is similar to numerous prior examinations, and incompletely characterized on today's noncontrast CT examination. Given the presence of pancreatic calcifications in the pancreatic head, this is favored to represent a small pseudocyst in the setting of chronic pancreatitis. Mild pancreatic ductal dilatation is also similar to prior examinations. 4. Additional incidental findings, as above, similar to the prior examinations. Electronically Signed   By: Vinnie Langton M.D.   On: 02/01/2016 09:42   ASSESSMENT AND PLAN: This is a very pleasant 55 year old African-American male with:  1)  metastatic non-small cell lung cancer, adenocarcinoma status post induction chemotherapy with carboplatin and Alimta with initial good response followed by disease progression. The patient is currently on second line treatment with immunotherapy with Nivolumab status post 22 cycles. He tolerated the last cycle of his treatment fairly well with no concerning adverse effects.  I recommended for him to continue his treatment with immunotherapy with cycle #23 today as scheduled.  2) recently diagnosed prostate adenocarcinoma: He is followed at [facility] for this problem. He is currently on hormonal therapy with Lupron and Casodex. He is scheduled for the next injection on 03/15/2016.  3) pain management, he will continue on Percocet as needed for palliation of pain due to bone metastasis. He was given a refill of Percocet today.  4) metastatic bone disease, he will be treated with Xgeva after receive dental clearance. He is still reluctant about new treatment.  The patient will come back for follow-up visit in 2 weeks for reevaluation before starting cycle #24.  He was advised to call immediately if he has any concerning symptoms in the interval.  The patient voices understanding of  current disease status and treatment options and is in agreement with the current care plan.  All questions were answered. The patient knows to call the clinic with any problems, questions or concerns. We can certainly see the patient much sooner if necessary.   Mikey Bussing, DNP, AGPCNP-BC, AOCNP

## 2016-03-14 ENCOUNTER — Other Ambulatory Visit (HOSPITAL_BASED_OUTPATIENT_CLINIC_OR_DEPARTMENT_OTHER): Payer: Medicare Other

## 2016-03-14 ENCOUNTER — Telehealth: Payer: Self-pay | Admitting: Internal Medicine

## 2016-03-14 ENCOUNTER — Encounter: Payer: Self-pay | Admitting: Internal Medicine

## 2016-03-14 ENCOUNTER — Ambulatory Visit (HOSPITAL_BASED_OUTPATIENT_CLINIC_OR_DEPARTMENT_OTHER): Payer: Medicare Other | Admitting: Internal Medicine

## 2016-03-14 ENCOUNTER — Ambulatory Visit (HOSPITAL_BASED_OUTPATIENT_CLINIC_OR_DEPARTMENT_OTHER): Payer: Medicare Other

## 2016-03-14 ENCOUNTER — Other Ambulatory Visit: Payer: Self-pay | Admitting: Pharmacist

## 2016-03-14 DIAGNOSIS — C3491 Malignant neoplasm of unspecified part of right bronchus or lung: Secondary | ICD-10-CM

## 2016-03-14 DIAGNOSIS — C7951 Secondary malignant neoplasm of bone: Secondary | ICD-10-CM

## 2016-03-14 DIAGNOSIS — C3401 Malignant neoplasm of right main bronchus: Secondary | ICD-10-CM

## 2016-03-14 DIAGNOSIS — Z5112 Encounter for antineoplastic immunotherapy: Secondary | ICD-10-CM | POA: Diagnosis present

## 2016-03-14 DIAGNOSIS — C61 Malignant neoplasm of prostate: Secondary | ICD-10-CM

## 2016-03-14 DIAGNOSIS — G893 Neoplasm related pain (acute) (chronic): Secondary | ICD-10-CM

## 2016-03-14 DIAGNOSIS — C7889 Secondary malignant neoplasm of other digestive organs: Secondary | ICD-10-CM | POA: Diagnosis not present

## 2016-03-14 DIAGNOSIS — C801 Malignant (primary) neoplasm, unspecified: Principal | ICD-10-CM

## 2016-03-14 DIAGNOSIS — C787 Secondary malignant neoplasm of liver and intrahepatic bile duct: Secondary | ICD-10-CM

## 2016-03-14 LAB — COMPREHENSIVE METABOLIC PANEL
ALBUMIN: 3.2 g/dL — AB (ref 3.5–5.0)
ALK PHOS: 125 U/L (ref 40–150)
ALT: 20 U/L (ref 0–55)
AST: 17 U/L (ref 5–34)
Anion Gap: 12 mEq/L — ABNORMAL HIGH (ref 3–11)
BUN: 55.1 mg/dL — AB (ref 7.0–26.0)
CHLORIDE: 110 meq/L — AB (ref 98–109)
CO2: 15 meq/L — AB (ref 22–29)
Calcium: 9.4 mg/dL (ref 8.4–10.4)
Creatinine: 2.8 mg/dL — ABNORMAL HIGH (ref 0.7–1.3)
EGFR: 28 mL/min/{1.73_m2} — AB (ref >90)
GLUCOSE: 91 mg/dL (ref 70–140)
POTASSIUM: 5.2 meq/L — AB (ref 3.5–5.1)
SODIUM: 136 meq/L (ref 136–145)
Total Bilirubin: <0.3 mg/dL (ref 0.20–1.20)
Total Protein: 8.3 g/dL (ref 6.4–8.3)

## 2016-03-14 LAB — CBC WITH DIFFERENTIAL/PLATELET
BASO%: 0.4 % (ref 0.0–2.0)
BASOS ABS: 0 10*3/uL (ref 0.0–0.1)
EOS ABS: 0.3 10*3/uL (ref 0.0–0.5)
EOS%: 3.2 % (ref 0.0–7.0)
HCT: 35.2 % — ABNORMAL LOW (ref 38.4–49.9)
HGB: 11.8 g/dL — ABNORMAL LOW (ref 13.0–17.1)
LYMPH%: 22.3 % (ref 14.0–49.0)
MCH: 31.4 pg (ref 27.2–33.4)
MCHC: 33.5 g/dL (ref 32.0–36.0)
MCV: 93.6 fL (ref 79.3–98.0)
MONO#: 1 10*3/uL — ABNORMAL HIGH (ref 0.1–0.9)
MONO%: 10.7 % (ref 0.0–14.0)
NEUT#: 6 10*3/uL (ref 1.5–6.5)
NEUT%: 63.4 % (ref 39.0–75.0)
Platelets: 251 10*3/uL (ref 140–400)
RBC: 3.76 10*6/uL — AB (ref 4.20–5.82)
RDW: 15.5 % — AB (ref 11.0–14.6)
WBC: 9.5 10*3/uL (ref 4.0–10.3)
lymph#: 2.1 10*3/uL (ref 0.9–3.3)

## 2016-03-14 MED ORDER — NIVOLUMAB CHEMO INJECTION 100 MG/10ML
240.0000 mg | Freq: Once | INTRAVENOUS | Status: AC
  Administered 2016-03-14: 240 mg via INTRAVENOUS
  Filled 2016-03-14: qty 20

## 2016-03-14 MED ORDER — HEPARIN SOD (PORK) LOCK FLUSH 100 UNIT/ML IV SOLN
500.0000 [IU] | Freq: Once | INTRAVENOUS | Status: AC | PRN
Start: 2016-03-14 — End: 2016-03-14
  Administered 2016-03-14: 500 [IU]
  Filled 2016-03-14: qty 5

## 2016-03-14 MED ORDER — SODIUM CHLORIDE 0.9 % IJ SOLN
10.0000 mL | INTRAMUSCULAR | Status: DC | PRN
  Administered 2016-03-14: 10 mL
  Filled 2016-03-14: qty 10

## 2016-03-14 MED ORDER — OXYCODONE-ACETAMINOPHEN 5-325 MG PO TABS: 1.0000 | ORAL_TABLET | ORAL | 0 refills | Status: DC | PRN

## 2016-03-14 MED ORDER — SODIUM CHLORIDE 0.9 % IV SOLN
Freq: Once | INTRAVENOUS | Status: AC
  Administered 2016-03-14: 10:00:00 via INTRAVENOUS

## 2016-03-14 NOTE — Progress Notes (Signed)
[place] [phone]   Fax:(336) [phone]  OFFICE PROGRESS NOTE  [name], [name], MD [place] [address]  DIAGNOSIS:  1) Stage IV (T2b, N2, M1b) non-small cell lung cancer, adenocarcinoma with negative EGFR mutation and negative gene translocation presented with a large right hilar mass as well as mediastinal lymphadenopathy and metastatic disease to the liver, bone and pancreas diagnosed in February 2016. 2) prostate adenocarcinoma diagnosed at [facility] with Gleason score 9 (4+5).  PRIOR THERAPY: Systemic chemotherapy with carboplatin for AUC of 5 and Alimta 500 MG/M2 every 3 weeks. Status post 6 cycles.  CURRENT THERAPY:  1) Immunotherapy with Nivolumab 240 MG every 2 weeks, status post 23 cycles. 2) Lupron and Casodex for the recently diagnosed prostate adenocarcinoma at [place]: Francisco Love 55 y.o. male returns to the clinic today for follow-up visit accompanied by his brother-in-law. The patient is doing fine today with no specific complaints. The patient continues to tolerate his current treatment with immunotherapy with Nivolumab fairly well. He denied having any specific complaints except for dry skin. He denied having any significant nausea or vomiting. The patient denied having any fever or chills, no significant weight loss or night sweats. He has no chest pain, shortness of breath but has mild cough with no hemoptysis. He is here today to start cycle #24 of his treatment. He is also requesting refill of his pain medication.  MEDICAL HISTORY: Past Medical History:  Diagnosis Date  . Shortness of breath dyspnea     ALLERGIES:  has No Known Allergies.  MEDICATIONS:  Current Outpatient Prescriptions  Medication Sig Dispense Refill  . acetaminophen (TYLENOL) 325 MG tablet Take 2 tablets (650 mg total) by mouth every 6 (six) hours as needed for mild pain (or Fever >/= 101).    Marland Kitchen  albuterol (PROVENTIL) (2.5 MG/3ML) 0.083% nebulizer solution Take 3 mLs (2.5 mg total) by nebulization every 2 (two) hours as needed for wheezing. 75 mL 12  . AMITIZA 24 MCG capsule     . bicalutamide (CASODEX) 50 MG tablet Take 50 mg by mouth.    . clobetasol ointment (TEMOVATE) 0.05 % Apply topically.    . diphenhydrAMINE (BENADRYL) 25 MG tablet Take 25 mg by mouth.    . doxycycline (ADOXA) 100 MG tablet     . doxycycline (VIBRA-TABS) 100 MG tablet     . ENSURE PLUS (ENSURE PLUS) LIQD Take by mouth.    . feeding supplement, ENSURE COMPLETE, (ENSURE COMPLETE) LIQD Take 237 mLs by mouth 3 (three) times daily between meals.    Marland Kitchen FLUOCINOLONE ACETONIDE BODY 0.01 % OIL     . levocetirizine (XYZAL) 5 MG tablet Take 5 mg by mouth.    . levofloxacin (LEVAQUIN) 500 MG tablet     . lidocaine-prilocaine (EMLA) cream Apply 1 application topically as needed. Apply to port site prior to chemotherapy. 30 g 0  . mirtazapine (REMERON) 15 MG tablet Take 1 tablet (15 mg total) by mouth at bedtime.    Marland Kitchen oxyCODONE-acetaminophen (PERCOCET/ROXICET) 5-325 MG tablet Take 1 tablet by mouth every 4 (four) hours as needed for severe pain. 60 tablet 0  . polyethylene glycol (MIRALAX / GLYCOLAX) packet Take 17 g by mouth daily.    . prochlorperazine (COMPAZINE) 10 MG tablet Take 1 tablet (10 mg total) by mouth every 6 (six) hours as needed for nausea or vomiting. 30 tablet 1  . senna-docusate (SENOKOT-S)  8.6-50 MG per tablet Take 1 tablet by mouth daily.    . tamsulosin (FLOMAX) 0.4 MG CAPS capsule     . triamcinolone ointment (KENALOG) 0.1 % Apply to arms twice a day until clear, then stop.    . valACYclovir (VALTREX) 1000 MG tablet      No current facility-administered medications for this visit.     SURGICAL HISTORY:  Past Surgical History:  Procedure Laterality Date  . APPENDECTOMY    . VIDEO BRONCHOSCOPY N/A 09/21/2014   Procedure: VIDEO BRONCHOSCOPY WITH FLUORO;  Surgeon: Rigoberto Noel, MD;  Location: [place];  Service: Cardiopulmonary;  Laterality: N/A;  . VIDEO BRONCHOSCOPY WITH ENDOBRONCHIAL ULTRASOUND N/A 09/23/2014   Procedure: VIDEO BRONCHOSCOPY WITH ENDOBRONCHIAL ULTRASOUND;  Surgeon: Rigoberto Noel, MD;  Location: [place];  Service: Pulmonary;  Laterality: N/A;    REVIEW OF SYSTEMS:  A comprehensive review of systems was negative except for: Musculoskeletal: positive for back pain   PHYSICAL EXAMINATION: General appearance: alert, cooperative, fatigued and no distress Head: Normocephalic, without obvious abnormality, atraumatic Neck: no adenopathy, no JVD, supple, symmetrical, trachea midline and thyroid not enlarged, symmetric, no tenderness/mass/nodules Lymph nodes: Cervical, supraclavicular, and axillary nodes normal. Resp: clear to auscultation bilaterally Back: symmetric, no curvature. ROM normal. No CVA tenderness. Cardio: regular rate and rhythm, S1, S2 normal, no murmur, click, rub or gallop GI: soft, non-tender; bowel sounds normal; no masses,  no organomegaly Extremities: extremities normal, atraumatic, no cyanosis or edema Neurologic: Alert and oriented X 3, normal strength and tone. Normal symmetric reflexes. Normal coordination and gait  ECOG PERFORMANCE STATUS: 1 - Symptomatic but completely ambulatory  Blood pressure 130/90, pulse (!) 110, temperature 97.7 F (36.5 C), temperature source Oral, resp. rate 19, height 5' 9"  (1.753 m), weight 153 lb 3.2 oz (69.5 kg), SpO2 95 %.  LABORATORY DATA: Lab Results  Component Value Date   WBC 9.5 03/14/2016   HGB 11.8 (L) 03/14/2016   HCT 35.2 (L) 03/14/2016   MCV 93.6 03/14/2016   PLT 251 03/14/2016      Chemistry      Component Value Date/Time   NA 136 03/14/2016 0840   K 5.2 (H) 03/14/2016 0840   CL 108 10/25/2014 0845   CO2 15 (L) 03/14/2016 0840   BUN 55.1 (H) 03/14/2016 0840   CREATININE 2.8 (H) 03/14/2016 0840      Component Value Date/Time   CALCIUM 9.4 03/14/2016 0840   ALKPHOS 125 03/14/2016 0840    AST 17 03/14/2016 0840   ALT 20 03/14/2016 0840   BILITOT <0.30 03/14/2016 0840       RADIOGRAPHIC STUDIES: No results found. ASSESSMENT AND PLAN: This is a very pleasant 55 years old African-American male with:  1)  metastatic non-small cell lung cancer, adenocarcinoma status post induction chemotherapy with carboplatin and Alimta with initial good response followed by disease progression. The patient is currently on second line treatment with immunotherapy with Nivolumab status post 23 cycles. He tolerated the last cycle of his treatment fairly well with no concerning adverse effects.  I recommended for him to continue his treatment with immunotherapy with cycle #24 today as scheduled.  2) recently diagnosed prostate adenocarcinoma: He is followed at [facility] for this problem. He is currently on hormonal therapy with Lupron and Casodex. He is scheduled for the next injection on 03/15/2016.  3) pain management, he would continue on Percocet as needed. He was given a refill of Percocet today.  4) metastatic bone disease, he  will be treated with Xgeva after receive dental clearance. He is still reluctant about new treatment. The patient would come back for follow-up visit in 2 weeks for reevaluation before starting cycle #25 after repeating CT scan of the chest, abdomen and pelvis without contrast.  He was advised to call immediately if he has any concerning symptoms in the interval. The patient voices understanding of current disease status and treatment options and is in agreement with the current care plan.  All questions were answered. The patient knows to call the clinic with any problems, questions or concerns. We can certainly see the patient much sooner if necessary.  Disclaimer: This note was dictated with voice recognition software. Similar sounding words can inadvertently be transcribed and may not be corrected upon review.

## 2016-03-14 NOTE — Patient Instructions (Signed)
[facility] Discharge Instructions for Patients Receiving Chemotherapy  Today you received the following chemotherapy agents Nivolumab  To help prevent nausea and vomiting after your treatment, we encourage you to take your nausea medication     If you develop nausea and vomiting that is not controlled by your nausea medication, call the clinic.   BELOW ARE SYMPTOMS THAT SHOULD BE REPORTED IMMEDIATELY:  *FEVER GREATER THAN 100.5 F  *CHILLS WITH OR WITHOUT FEVER  NAUSEA AND VOMITING THAT IS NOT CONTROLLED WITH YOUR NAUSEA MEDICATION  *UNUSUAL SHORTNESS OF BREATH  *UNUSUAL BRUISING OR BLEEDING  TENDERNESS IN MOUTH AND THROAT WITH OR WITHOUT PRESENCE OF ULCERS  *URINARY PROBLEMS  *BOWEL PROBLEMS  UNUSUAL RASH Items with * indicate a potential emergency and should be followed up as soon as possible.  Feel free to call the clinic you have any questions or concerns. The clinic phone number is [phone].  Please show the CHEMO ALERT CARD at check-in to the Emergency Department and triage nurse.   

## 2016-03-14 NOTE — Telephone Encounter (Signed)
Gave pt cal & avs °

## 2016-03-14 NOTE — Progress Notes (Signed)
Ok to treat with creatinine 2.8 per Dr. Julien Nordmann.

## 2016-03-15 ENCOUNTER — Telehealth: Payer: Self-pay | Admitting: *Deleted

## 2016-03-15 NOTE — Telephone Encounter (Signed)
"  I missed a call from Radiology scheduling to schedule scans on March 29, 2016."  [place] number provided.

## 2016-03-29 ENCOUNTER — Ambulatory Visit (HOSPITAL_BASED_OUTPATIENT_CLINIC_OR_DEPARTMENT_OTHER): Payer: Medicare Other

## 2016-03-29 ENCOUNTER — Encounter: Payer: Self-pay | Admitting: Internal Medicine

## 2016-03-29 ENCOUNTER — Encounter (HOSPITAL_COMMUNITY): Payer: Self-pay

## 2016-03-29 ENCOUNTER — Other Ambulatory Visit (HOSPITAL_BASED_OUTPATIENT_CLINIC_OR_DEPARTMENT_OTHER): Payer: Medicare Other

## 2016-03-29 ENCOUNTER — Ambulatory Visit (HOSPITAL_BASED_OUTPATIENT_CLINIC_OR_DEPARTMENT_OTHER): Payer: Medicare Other | Admitting: Internal Medicine

## 2016-03-29 ENCOUNTER — Ambulatory Visit (HOSPITAL_COMMUNITY)
Admission: RE | Admit: 2016-03-29 | Discharge: 2016-03-29 | Disposition: A | Discharge status: Home / Self Care | Payer: Medicare Other | Source: Ambulatory Visit | Attending: Internal Medicine | Admitting: Internal Medicine

## 2016-03-29 DIAGNOSIS — C7889 Secondary malignant neoplasm of other digestive organs: Secondary | ICD-10-CM

## 2016-03-29 DIAGNOSIS — J90 Pleural effusion, not elsewhere classified: Secondary | ICD-10-CM | POA: Diagnosis not present

## 2016-03-29 DIAGNOSIS — Z5112 Encounter for antineoplastic immunotherapy: Secondary | ICD-10-CM

## 2016-03-29 DIAGNOSIS — C7951 Secondary malignant neoplasm of bone: Secondary | ICD-10-CM

## 2016-03-29 DIAGNOSIS — G893 Neoplasm related pain (acute) (chronic): Secondary | ICD-10-CM

## 2016-03-29 DIAGNOSIS — C801 Malignant (primary) neoplasm, unspecified: Secondary | ICD-10-CM | POA: Diagnosis not present

## 2016-03-29 DIAGNOSIS — I700 Atherosclerosis of aorta: Secondary | ICD-10-CM | POA: Diagnosis not present

## 2016-03-29 DIAGNOSIS — C61 Malignant neoplasm of prostate: Secondary | ICD-10-CM

## 2016-03-29 DIAGNOSIS — C3401 Malignant neoplasm of right main bronchus: Secondary | ICD-10-CM

## 2016-03-29 DIAGNOSIS — Z79899 Other long term (current) drug therapy: Secondary | ICD-10-CM

## 2016-03-29 DIAGNOSIS — C787 Secondary malignant neoplasm of liver and intrahepatic bile duct: Secondary | ICD-10-CM

## 2016-03-29 DIAGNOSIS — C3491 Malignant neoplasm of unspecified part of right bronchus or lung: Secondary | ICD-10-CM

## 2016-03-29 DIAGNOSIS — R599 Enlarged lymph nodes, unspecified: Secondary | ICD-10-CM | POA: Insufficient documentation

## 2016-03-29 DIAGNOSIS — Z9221 Personal history of antineoplastic chemotherapy: Secondary | ICD-10-CM | POA: Insufficient documentation

## 2016-03-29 DIAGNOSIS — R918 Other nonspecific abnormal finding of lung field: Secondary | ICD-10-CM | POA: Diagnosis not present

## 2016-03-29 LAB — TSH: TSH: 2.64 m[IU]/L (ref 0.320–4.118)

## 2016-03-29 LAB — CBC WITH DIFFERENTIAL/PLATELET
BASO%: 0.8 % (ref 0.0–2.0)
Basophils Absolute: 0.1 10*3/uL (ref 0.0–0.1)
EOS ABS: 0.4 10*3/uL (ref 0.0–0.5)
EOS%: 3.6 % (ref 0.0–7.0)
HEMATOCRIT: 39.2 % (ref 38.4–49.9)
HEMOGLOBIN: 12.7 g/dL — AB (ref 13.0–17.1)
LYMPH#: 1.8 10*3/uL (ref 0.9–3.3)
LYMPH%: 17.1 % (ref 14.0–49.0)
MCH: 31.6 pg (ref 27.2–33.4)
MCHC: 32.3 g/dL (ref 32.0–36.0)
MCV: 97.8 fL (ref 79.3–98.0)
MONO#: 1.2 10*3/uL — AB (ref 0.1–0.9)
MONO%: 10.9 % (ref 0.0–14.0)
NEUT%: 67.6 % (ref 39.0–75.0)
NEUTROS ABS: 7.2 10*3/uL — AB (ref 1.5–6.5)
Platelets: 306 10*3/uL (ref 140–400)
RBC: 4.01 10*6/uL — ABNORMAL LOW (ref 4.20–5.82)
RDW: 16.1 % — AB (ref 11.0–14.6)
WBC: 10.6 10*3/uL — AB (ref 4.0–10.3)

## 2016-03-29 LAB — COMPREHENSIVE METABOLIC PANEL
ALT: 35 U/L (ref 0–55)
ANION GAP: 12 meq/L — AB (ref 3–11)
AST: 27 U/L (ref 5–34)
Albumin: 3.3 g/dL — ABNORMAL LOW (ref 3.5–5.0)
Alkaline Phosphatase: 134 U/L (ref 40–150)
BUN: 46.4 mg/dL — ABNORMAL HIGH (ref 7.0–26.0)
CALCIUM: 9.7 mg/dL (ref 8.4–10.4)
CHLORIDE: 107 meq/L (ref 98–109)
CO2: 15 mEq/L — ABNORMAL LOW (ref 22–29)
CREATININE: 2.9 mg/dL — AB (ref 0.7–1.3)
EGFR: 27 mL/min/{1.73_m2} — AB (ref >90)
Glucose: 99 mg/dl (ref 70–140)
POTASSIUM: 5 meq/L (ref 3.5–5.1)
Sodium: 135 mEq/L — ABNORMAL LOW (ref 136–145)
Total Bilirubin: <0.3 mg/dL (ref 0.20–1.20)
Total Protein: 8.7 g/dL — ABNORMAL HIGH (ref 6.4–8.3)

## 2016-03-29 MED ORDER — SODIUM CHLORIDE 0.9 % IV SOLN
Freq: Once | INTRAVENOUS | Status: AC
  Administered 2016-03-29: 09:00:00 via INTRAVENOUS

## 2016-03-29 MED ORDER — OXYCODONE-ACETAMINOPHEN 5-325 MG PO TABS: 1.0000 | ORAL_TABLET | ORAL | 0 refills | Status: DC | PRN

## 2016-03-29 MED ORDER — HEPARIN SOD (PORK) LOCK FLUSH 100 UNIT/ML IV SOLN
500.0000 [IU] | Freq: Once | INTRAVENOUS | Status: AC | PRN
  Administered 2016-03-29: 500 [IU]
  Filled 2016-03-29: qty 5

## 2016-03-29 MED ORDER — SODIUM CHLORIDE 0.9 % IJ SOLN
10.0000 mL | INTRAMUSCULAR | Status: DC | PRN
  Administered 2016-03-29: 10 mL
  Filled 2016-03-29: qty 10

## 2016-03-29 MED ORDER — SODIUM CHLORIDE 0.9 % IV SOLN
240.0000 mg | Freq: Once | INTRAVENOUS | Status: AC
  Administered 2016-03-29: 240 mg via INTRAVENOUS
  Filled 2016-03-29: qty 20

## 2016-03-29 NOTE — Patient Instructions (Signed)
[facility] Discharge Instructions for Patients Receiving Chemotherapy  Today you received the following chemotherapy agents Nivolumab  To help prevent nausea and vomiting after your treatment, we encourage you to take your nausea medication     If you develop nausea and vomiting that is not controlled by your nausea medication, call the clinic.   BELOW ARE SYMPTOMS THAT SHOULD BE REPORTED IMMEDIATELY:  *FEVER GREATER THAN 100.5 F  *CHILLS WITH OR WITHOUT FEVER  NAUSEA AND VOMITING THAT IS NOT CONTROLLED WITH YOUR NAUSEA MEDICATION  *UNUSUAL SHORTNESS OF BREATH  *UNUSUAL BRUISING OR BLEEDING  TENDERNESS IN MOUTH AND THROAT WITH OR WITHOUT PRESENCE OF ULCERS  *URINARY PROBLEMS  *BOWEL PROBLEMS  UNUSUAL RASH Items with * indicate a potential emergency and should be followed up as soon as possible.  Feel free to call the clinic you have any questions or concerns. The clinic phone number is [phone].  Please show the CHEMO ALERT CARD at check-in to the Emergency Department and triage nurse.   

## 2016-03-29 NOTE — Progress Notes (Signed)
Reviewed pt labs with Dr. Julien Nordmann (CBC and CMP) and pt ok to be treated with Creatinine of 2.9

## 2016-03-29 NOTE — Progress Notes (Signed)
[name] Telephone:([phone]   [phone]) [phone]  OFFICE PROGRESS NOTE  Francisco Love, Francisco Heinrich, MD [place] [address]  DIAGNOSIS:  1) Stage IV (T2b, N2, M1b) non-small cell lung cancer, adenocarcinoma with negative EGFR mutation and negative gene translocation presented with a large right hilar mass as well as mediastinal lymphadenopathy and metastatic disease to the liver, bone and pancreas diagnosed in February 2016. 2) prostate adenocarcinoma diagnosed at [facility] with Francisco score 9 (4+5).  PRIOR THERAPY: Systemic chemotherapy with carboplatin for AUC of 5 and Alimta 500 MG/M2 every 3 weeks. Status post 6 cycles.  CURRENT THERAPY:  1) Immunotherapy with Nivolumab 240 MG every 2 weeks, status post 24 cycles. 2) Lupron and Casodex for the recently diagnosed prostate adenocarcinoma at [place]: Francisco Love 55 y.o. male returns to the clinic today for follow-up visit accompanied by his brother-in-law. The patient is doing fine today with no specific complaints. He was tachycardic earlier today after walking to different areas including radiology and back to the [place]. The patient continues to tolerate his current treatment with immunotherapy with Nivolumab fairly well. He denied having any significant nausea or vomiting. The patient denied having any fever or chills, no significant weight loss or night sweats. He has no chest pain, shortness of breath but has mild cough with no hemoptysis. He had repeat CT scan of the chest, abdomen and pelvis performed earlier today and he is here for evaluation and discussion of his scan results before starting cycle #25.  MEDICAL HISTORY: Past Medical History:  Diagnosis Date  . Shortness of breath dyspnea     ALLERGIES:  has No Known Allergies.  MEDICATIONS:  Current Outpatient Prescriptions  Medication Sig Dispense Refill  . acetaminophen (TYLENOL) 325 MG  tablet Take 2 tablets (650 mg total) by mouth every 6 (six) hours as needed for mild pain (or Fever >/= 101).    Marland Kitchen albuterol (PROVENTIL) (2.5 MG/3ML) 0.083% nebulizer solution Take 3 mLs (2.5 mg total) by nebulization every 2 (two) hours as needed for wheezing. 75 mL 12  . AMITIZA 24 MCG capsule     . bicalutamide (CASODEX) 50 MG tablet Take 50 mg by mouth.    . clobetasol ointment (TEMOVATE) 0.05 % Apply topically.    . diphenhydrAMINE (BENADRYL) 25 MG tablet Take 25 mg by mouth.    . doxycycline (ADOXA) 100 MG tablet     . doxycycline (VIBRA-TABS) 100 MG tablet     . ENSURE PLUS (ENSURE PLUS) LIQD Take by mouth.    . feeding supplement, ENSURE COMPLETE, (ENSURE COMPLETE) LIQD Take 237 mLs by mouth 3 (three) times daily between meals.    Marland Kitchen FLUOCINOLONE ACETONIDE BODY 0.01 % OIL     . levocetirizine (XYZAL) 5 MG tablet Take 5 mg by mouth.    . levofloxacin (LEVAQUIN) 500 MG tablet     . lidocaine-prilocaine (EMLA) cream Apply 1 application topically as needed. Apply to port site prior to chemotherapy. 30 g 0  . mirtazapine (REMERON) 15 MG tablet Take 1 tablet (15 mg total) by mouth at bedtime.    Marland Kitchen oxyCODONE-acetaminophen (PERCOCET/ROXICET) 5-325 MG tablet Take 1 tablet by mouth every 4 (four) hours as needed for severe pain. 60 tablet 0  . polyethylene glycol (MIRALAX / GLYCOLAX) packet Take 17 g by mouth daily.    . prochlorperazine (COMPAZINE) 10 MG tablet Take 1 tablet (10 mg total) by  mouth every 6 (six) hours as needed for nausea or vomiting. 30 tablet 1  . senna-docusate (SENOKOT-S) 8.6-50 MG per tablet Take 1 tablet by mouth daily.    . tamsulosin (FLOMAX) 0.4 MG CAPS capsule     . triamcinolone ointment (KENALOG) 0.1 % Apply to arms twice a day until clear, then stop.    . valACYclovir (VALTREX) 1000 MG tablet      No current facility-administered medications for this visit.     SURGICAL HISTORY:  Past Surgical History:  Procedure Laterality Date  . APPENDECTOMY    . VIDEO  BRONCHOSCOPY N/A 09/21/2014   Procedure: VIDEO BRONCHOSCOPY WITH FLUORO;  Surgeon: Rigoberto Noel, MD;  Location: [place];  Service: Cardiopulmonary;  Laterality: N/A;  . VIDEO BRONCHOSCOPY WITH ENDOBRONCHIAL ULTRASOUND N/A 09/23/2014   Procedure: VIDEO BRONCHOSCOPY WITH ENDOBRONCHIAL ULTRASOUND;  Surgeon: Rigoberto Noel, MD;  Location: [place];  Service: Pulmonary;  Laterality: N/A;    REVIEW OF SYSTEMS:  Constitutional: negative Eyes: negative Ears, nose, mouth, throat, and face: negative Respiratory: negative Cardiovascular: positive for tachypnea Gastrointestinal: negative Genitourinary:negative Integument/breast: negative Hematologic/lymphatic: negative Musculoskeletal:negative Neurological: negative Behavioral/Psych: negative Endocrine: negative Allergic/Immunologic: negative   PHYSICAL EXAMINATION: General appearance: alert, cooperative, fatigued and no distress Head: Normocephalic, without obvious abnormality, atraumatic Neck: no adenopathy, no JVD, supple, symmetrical, trachea midline and thyroid not enlarged, symmetric, no tenderness/mass/nodules Lymph nodes: Cervical, supraclavicular, and axillary nodes normal. Resp: clear to auscultation bilaterally Back: symmetric, no curvature. ROM normal. No CVA tenderness. Cardio: regular rate and rhythm, S1, S2 normal, no murmur, click, rub or gallop GI: soft, non-tender; bowel sounds normal; no masses,  no organomegaly Extremities: extremities normal, atraumatic, no cyanosis or edema Neurologic: Alert and oriented X 3, normal strength and tone. Normal symmetric reflexes. Normal coordination and gait  ECOG PERFORMANCE STATUS: 1 - Symptomatic but completely ambulatory  There were no vitals taken for this visit.  LABORATORY DATA: Lab Results  Component Value Date   WBC 10.6 (H) 03/29/2016   HGB 12.7 (L) 03/29/2016   HCT 39.2 03/29/2016   MCV 97.8 03/29/2016   PLT 306 03/29/2016      Chemistry      Component Value Date/Time     NA 136 03/14/2016 0840   K 5.2 (H) 03/14/2016 0840   CL 108 10/25/2014 0845   CO2 15 (L) 03/14/2016 0840   BUN 55.1 (H) 03/14/2016 0840   CREATININE 2.8 (H) 03/14/2016 0840      Component Value Date/Time   CALCIUM 9.4 03/14/2016 0840   ALKPHOS 125 03/14/2016 0840   AST 17 03/14/2016 0840   ALT 20 03/14/2016 0840   BILITOT <0.30 03/14/2016 0840       RADIOGRAPHIC STUDIES: No results found. ASSESSMENT AND PLAN: This is a very pleasant 55 years old African-American male with:  1)  metastatic non-small cell lung cancer, adenocarcinoma status post induction chemotherapy with carboplatin and Alimta with initial good response followed by disease progression. The patient is currently on second line treatment with immunotherapy with Nivolumab status post 24 cycles. He tolerated the last cycle of his treatment fairly well with no concerning adverse effects.  The patient had CT scan of the chest, abdomen and pelvis performed earlier today but the final report is still pending. I reviewed the images and did not see any concerning findings for disease progression. I recommended for him to continue his treatment with immunotherapy with cycle #25 today as scheduled.  2) recently diagnosed prostate adenocarcinoma: He is followed at [facility] for this problem. He is currently on hormonal therapy with Lupron and Casodex. He is scheduled for the next injection on 03/15/2016.  3) pain management, he would continue on Percocet as needed. He was given a refill of Percocet today.  4) metastatic bone disease, he will be treated with Xgeva after receive dental clearance. He is still reluctant about new treatment. The patient would come back for follow-up visit in 2 weeks for reevaluation before starting cycle #26. after repeating CT scan of the chest, abdomen and pelvis without contrast.  He was advised to call immediately if he has any concerning symptoms in the interval. The patient voices  understanding of current disease status and treatment options and is in agreement with the current care plan.  All questions were answered. The patient knows to call the clinic with any problems, questions or concerns. We can certainly see the patient much sooner if necessary.  Disclaimer: This note was dictated with voice recognition software. Similar sounding words can inadvertently be transcribed and may not be corrected upon review.

## 2016-04-12 ENCOUNTER — Telehealth: Payer: Self-pay | Admitting: Internal Medicine

## 2016-04-12 ENCOUNTER — Other Ambulatory Visit (HOSPITAL_BASED_OUTPATIENT_CLINIC_OR_DEPARTMENT_OTHER): Payer: Medicare Other

## 2016-04-12 ENCOUNTER — Ambulatory Visit (HOSPITAL_BASED_OUTPATIENT_CLINIC_OR_DEPARTMENT_OTHER): Payer: Medicare Other | Admitting: Internal Medicine

## 2016-04-12 ENCOUNTER — Ambulatory Visit (HOSPITAL_BASED_OUTPATIENT_CLINIC_OR_DEPARTMENT_OTHER): Payer: Medicare Other

## 2016-04-12 ENCOUNTER — Encounter: Payer: Self-pay | Admitting: Internal Medicine

## 2016-04-12 DIAGNOSIS — R5382 Chronic fatigue, unspecified: Secondary | ICD-10-CM | POA: Insufficient documentation

## 2016-04-12 DIAGNOSIS — C61 Malignant neoplasm of prostate: Secondary | ICD-10-CM

## 2016-04-12 DIAGNOSIS — C3401 Malignant neoplasm of right main bronchus: Secondary | ICD-10-CM

## 2016-04-12 DIAGNOSIS — C7951 Secondary malignant neoplasm of bone: Secondary | ICD-10-CM | POA: Diagnosis not present

## 2016-04-12 DIAGNOSIS — G893 Neoplasm related pain (acute) (chronic): Secondary | ICD-10-CM

## 2016-04-12 DIAGNOSIS — Z5112 Encounter for antineoplastic immunotherapy: Secondary | ICD-10-CM

## 2016-04-12 DIAGNOSIS — C787 Secondary malignant neoplasm of liver and intrahepatic bile duct: Secondary | ICD-10-CM | POA: Diagnosis not present

## 2016-04-12 DIAGNOSIS — C7989 Secondary malignant neoplasm of other specified sites: Secondary | ICD-10-CM | POA: Diagnosis not present

## 2016-04-12 DIAGNOSIS — C3491 Malignant neoplasm of unspecified part of right bronchus or lung: Secondary | ICD-10-CM

## 2016-04-12 DIAGNOSIS — C801 Malignant (primary) neoplasm, unspecified: Principal | ICD-10-CM

## 2016-04-12 DIAGNOSIS — G8929 Other chronic pain: Secondary | ICD-10-CM

## 2016-04-12 HISTORY — DX: Other chronic pain: G89.29

## 2016-04-12 HISTORY — DX: Chronic fatigue, unspecified: R53.82

## 2016-04-12 LAB — COMPREHENSIVE METABOLIC PANEL
ALT: 18 U/L (ref 0–55)
ANION GAP: 8 meq/L (ref 3–11)
AST: 15 U/L (ref 5–34)
Albumin: 3.1 g/dL — ABNORMAL LOW (ref 3.5–5.0)
Alkaline Phosphatase: 129 U/L (ref 40–150)
BUN: 42 mg/dL — AB (ref 7.0–26.0)
CO2: 17 meq/L — AB (ref 22–29)
CREATININE: 2.5 mg/dL — AB (ref 0.7–1.3)
Calcium: 9.2 mg/dL (ref 8.4–10.4)
Chloride: 111 mEq/L — ABNORMAL HIGH (ref 98–109)
EGFR: 31 mL/min/{1.73_m2} — ABNORMAL LOW (ref >90)
GLUCOSE: 111 mg/dL (ref 70–140)
Potassium: 5.4 mEq/L — ABNORMAL HIGH (ref 3.5–5.1)
Sodium: 136 mEq/L (ref 136–145)
TOTAL PROTEIN: 7.7 g/dL (ref 6.4–8.3)

## 2016-04-12 LAB — CBC WITH DIFFERENTIAL/PLATELET
BASO%: 0.6 % (ref 0.0–2.0)
Basophils Absolute: 0.1 10*3/uL (ref 0.0–0.1)
EOS%: 4.9 % (ref 0.0–7.0)
Eosinophils Absolute: 0.5 10*3/uL (ref 0.0–0.5)
HCT: 36.3 % — ABNORMAL LOW (ref 38.4–49.9)
HGB: 12 g/dL — ABNORMAL LOW (ref 13.0–17.1)
LYMPH#: 1.2 10*3/uL (ref 0.9–3.3)
LYMPH%: 11.4 % — AB (ref 14.0–49.0)
MCH: 32 pg (ref 27.2–33.4)
MCHC: 33.1 g/dL (ref 32.0–36.0)
MCV: 96.6 fL (ref 79.3–98.0)
MONO#: 0.9 10*3/uL (ref 0.1–0.9)
MONO%: 8.5 % (ref 0.0–14.0)
NEUT%: 74.6 % (ref 39.0–75.0)
NEUTROS ABS: 7.8 10*3/uL — AB (ref 1.5–6.5)
PLATELETS: 282 10*3/uL (ref 140–400)
RBC: 3.76 10*6/uL — AB (ref 4.20–5.82)
RDW: 15.5 % — ABNORMAL HIGH (ref 11.0–14.6)
WBC: 10.5 10*3/uL — AB (ref 4.0–10.3)

## 2016-04-12 LAB — BASIC METABOLIC PANEL
Anion Gap: 9 mEq/L (ref 3–11)
BUN: 39.6 mg/dL — AB (ref 7.0–26.0)
CHLORIDE: 111 meq/L — AB (ref 98–109)
CO2: 15 mEq/L — ABNORMAL LOW (ref 22–29)
CREATININE: 2.3 mg/dL — AB (ref 0.7–1.3)
Calcium: 8.7 mg/dL (ref 8.4–10.4)
EGFR: 36 mL/min/{1.73_m2} — ABNORMAL LOW (ref >90)
GLUCOSE: 117 mg/dL (ref 70–140)
POTASSIUM: 4.6 meq/L (ref 3.5–5.1)
Sodium: 135 mEq/L — ABNORMAL LOW (ref 136–145)

## 2016-04-12 MED ORDER — SODIUM CHLORIDE 0.9 % IV SOLN
Freq: Once | INTRAVENOUS | Status: AC
  Administered 2016-04-12: 09:00:00 via INTRAVENOUS

## 2016-04-12 MED ORDER — HEPARIN SOD (PORK) LOCK FLUSH 100 UNIT/ML IV SOLN
500.0000 [IU] | Freq: Once | INTRAVENOUS | Status: AC | PRN
  Administered 2016-04-12: 500 [IU]
  Filled 2016-04-12: qty 5

## 2016-04-12 MED ORDER — OXYCODONE-ACETAMINOPHEN 5-325 MG PO TABS: 1.0000 | ORAL_TABLET | ORAL | 0 refills | Status: DC | PRN

## 2016-04-12 MED ORDER — SODIUM CHLORIDE 0.9 % IJ SOLN
10.0000 mL | INTRAMUSCULAR | Status: DC | PRN
  Administered 2016-04-12: 10 mL
  Filled 2016-04-12: qty 10

## 2016-04-12 MED ORDER — SODIUM CHLORIDE 0.9 % IV SOLN
240.0000 mg | Freq: Once | INTRAVENOUS | Status: AC
  Administered 2016-04-12: 240 mg via INTRAVENOUS
  Filled 2016-04-12: qty 20

## 2016-04-12 NOTE — Progress Notes (Signed)
[place] [phone]   [phone]  OFFICE PROGRESS NOTE  Francisco Love, Francisco Heinrich, MD [place] [id number]  DIAGNOSIS:  1) Stage IV (T2b, N2, M1b) non-small cell lung cancer, adenocarcinoma with negative EGFR mutation and negative gene translocation presented with a large right hilar mass as well as mediastinal lymphadenopathy and metastatic disease to the liver, bone and pancreas diagnosed in February 2016. 2) prostate adenocarcinoma diagnosed at [facility] with Gleason score 9 (4+5).  PRIOR THERAPY: Systemic chemotherapy with carboplatin for AUC of 5 and Alimta 500 MG/M2 every 3 weeks. Status post 6 cycles.  CURRENT THERAPY:  1) Immunotherapy with Nivolumab 240 MG every 2 weeks, status post 25 cycles. 2) Lupron and Casodex for the recently diagnosed prostate adenocarcinoma at [place]: Francisco Love 55 y.o. male returns to the clinic today for follow-up visit accompanied by his brother-in-law. The patient is doing fine today with no specific complaints. The patient continues to tolerate his current treatment with immunotherapy with Nivolumab fairly well. He denied having any significant nausea or vomiting. The patient denied having any fever or chills, no significant weight loss or night sweats. He has no chest pain, shortness of breath but has mild cough with no hemoptysis. He is here today for evaluation before starting cycle #26  MEDICAL HISTORY: Past Medical History:  Diagnosis Date  . Shortness of breath dyspnea     ALLERGIES:  has No Known Allergies.  MEDICATIONS:  Current Outpatient Prescriptions  Medication Sig Dispense Refill  . acetaminophen (TYLENOL) 325 MG tablet Take 2 tablets (650 mg total) by mouth every 6 (six) hours as needed for mild pain (or Fever >/= 101).    Marland Kitchen albuterol (PROVENTIL) (2.5 MG/3ML) 0.083% nebulizer solution Take 3 mLs (2.5 mg total) by nebulization every 2  (two) hours as needed for wheezing. 75 mL 12  . AMITIZA 24 MCG capsule     . bicalutamide (CASODEX) 50 MG tablet Take 50 mg by mouth.    . clobetasol ointment (TEMOVATE) 0.05 % Apply topically.    . diphenhydrAMINE (BENADRYL) 25 MG tablet Take 25 mg by mouth.    . doxycycline (ADOXA) 100 MG tablet     . doxycycline (VIBRA-TABS) 100 MG tablet     . feeding supplement, ENSURE COMPLETE, (ENSURE COMPLETE) LIQD Take 237 mLs by mouth 3 (three) times daily between meals.    Marland Kitchen FLUOCINOLONE ACETONIDE BODY 0.01 % OIL     . levocetirizine (XYZAL) 5 MG tablet Take 5 mg by mouth.    . lidocaine-prilocaine (EMLA) cream Apply 1 application topically as needed. Apply to port site prior to chemotherapy. 30 g 0  . mirtazapine (REMERON) 15 MG tablet Take 1 tablet (15 mg total) by mouth at bedtime.    Marland Kitchen oxyCODONE-acetaminophen (PERCOCET/ROXICET) 5-325 MG tablet Take 1 tablet by mouth every 4 (four) hours as needed for severe pain. 60 tablet 0  . polyethylene glycol (MIRALAX / GLYCOLAX) packet Take 17 g by mouth daily.    . prochlorperazine (COMPAZINE) 10 MG tablet Take 1 tablet (10 mg total) by mouth every 6 (six) hours as needed for nausea or vomiting. 30 tablet 1  . senna-docusate (SENOKOT-S) 8.6-50 MG per tablet Take 1 tablet by mouth daily.    . tamsulosin (FLOMAX) 0.4 MG CAPS capsule     . triamcinolone ointment (KENALOG) 0.1 % Apply to arms twice a day until clear, then stop.    Marland Kitchen  valACYclovir (VALTREX) 1000 MG tablet      No current facility-administered medications for this visit.     SURGICAL HISTORY:  Past Surgical History:  Procedure Laterality Date  . APPENDECTOMY    . VIDEO BRONCHOSCOPY N/A 09/21/2014   Procedure: VIDEO BRONCHOSCOPY WITH FLUORO;  Surgeon: Rigoberto Noel, MD;  Location: [place];  Service: Cardiopulmonary;  Laterality: N/A;  . VIDEO BRONCHOSCOPY WITH ENDOBRONCHIAL ULTRASOUND N/A 09/23/2014   Procedure: VIDEO BRONCHOSCOPY WITH ENDOBRONCHIAL ULTRASOUND;  Surgeon: Rigoberto Noel,  MD;  Location: [place];  Service: Pulmonary;  Laterality: N/A;    REVIEW OF SYSTEMS:  A comprehensive review of systems was negative except for: Musculoskeletal: positive for back pain   PHYSICAL EXAMINATION: General appearance: alert, cooperative, fatigued and no distress Head: Normocephalic, without obvious abnormality, atraumatic Neck: no adenopathy, no JVD, supple, symmetrical, trachea midline and thyroid not enlarged, symmetric, no tenderness/mass/nodules Lymph nodes: Cervical, supraclavicular, and axillary nodes normal. Resp: clear to auscultation bilaterally Back: symmetric, no curvature. ROM normal. No CVA tenderness. Cardio: regular rate and rhythm, S1, S2 normal, no murmur, click, rub or gallop GI: soft, non-tender; bowel sounds normal; no masses,  no organomegaly Extremities: extremities normal, atraumatic, no cyanosis or edema Neurologic: Alert and oriented X 3, normal strength and tone. Normal symmetric reflexes. Normal coordination and gait  ECOG PERFORMANCE STATUS: 1 - Symptomatic but completely ambulatory  Blood pressure 128/86, pulse (!) 103, temperature 97.8 F (36.6 C), temperature source Oral, resp. rate 18, height _0  (1.753 m), weight 155 lb 11.2 oz (70.6 kg), SpO2 100 %.  LABORATORY DATA: Lab Results  Component Value Date   WBC 10.5 (H) 04/12/2016   HGB 12.0 (L) 04/12/2016   HCT 36.3 (L) 04/12/2016   MCV 96.6 04/12/2016   PLT 282 04/12/2016      Chemistry      Component Value Date/Time   NA 135 (L) 03/29/2016 0738   K 5.0 03/29/2016 0738   CL 108 10/25/2014 0845   CO2 15 (L) 03/29/2016 0738   BUN 46.4 (H) 03/29/2016 0738   CREATININE 2.9 (H) 03/29/2016 0738      Component Value Date/Time   CALCIUM 9.7 03/29/2016 0738   ALKPHOS 134 03/29/2016 0738   AST 27 03/29/2016 0738   ALT 35 03/29/2016 0738   BILITOT <0.30 03/29/2016 0738       RADIOGRAPHIC STUDIES: Ct Abdomen Pelvis Wo Contrast  Result Date: 03/29/2016 CLINICAL DATA:  55 year old male  with history of stage IV non-small cell lung cancer diagnosed in February 2016, with known bone metastases. New diagnosis of prostate cancer undergoing hormone therapy. EXAM: CT CHEST, ABDOMEN AND PELVIS WITHOUT CONTRAST TECHNIQUE: Multidetector CT imaging of the chest, abdomen and pelvis was performed following the standard protocol without IV contrast. COMPARISON:  CT of the chest, abdomen and pelvis 02/01/2016, as well as numerous older prior examinations. FINDINGS: CT CHEST FINDINGS Cardiovascular: Heart size is normal. There is no significant pericardial fluid, thickening or pericardial calcification. There is aortic atherosclerosis, as well as atherosclerosis of the great vessels of the mediastinum and the coronary arteries, including calcified atherosclerotic plaque in the left anterior descending coronary arteries. Right internal jugular single-lumen porta cath with tip terminating in the right atrium. Mediastinum/Nodes: Previously noted partially calcified right paratracheal nodal mass is similar to the prior study measuring 3.9 x 2.8 cm on today's examination (previously 4.0 x 2.9 cm on 02/01/2016). Additional partially calcified right paratracheal and right hilar lymph nodes are also similar. Esophagus is unremarkable in appearance.  No axillary lymphadenopathy. Lungs/Pleura: There is a background of mild diffuse ground-glass attenuation and interlobular septal thickening which is similar to the prior study. Calcified granuloma surrounded by area of architectural distortion in the left upper lobe there is unchanged. Similar appearing tear of architectural distortion in the dependent portion of the right lower lobe with central 4 mm noncalcified pulmonary nodule (image 79 of series 4), also similar to numerous prior examinations. Patchy areas of mild centrilobular and paraseptal emphysema. No new suspicious appearing pulmonary nodules or masses. Trace bilateral pleural effusions and/or pleural thickening  most notably in the dependent portion of the thorax bilaterally, similar to the prior study. Musculoskeletal: Innumerable predominantly sclerotic lesions are again noted throughout the visualized axial and appendicular skeleton, overall very similar to prior study from 02/01/2016. The most extensive involvement noted in the spine is again at T11 with near confluent involvement of the entire vertebral body and extension into the posterior elements. CT ABDOMEN PELVIS FINDINGS Hepatobiliary: No definite cystic or solid hepatic lesions are identified on today's noncontrast CT examination. Unenhanced appearance of the gallbladder is normal. Pancreas: Small low-attenuation lesion in the head of the pancreas is again similar to prior examinations, measuring 1.4 x 1.8 cm (image 67 of series 2). Adjacent to this there are several coarse calcifications in the head of the pancreas. There appears to be some very mild pancreatic ductal dilatation (4 mm) in the proximal body of the pancreas which is unchanged. No peripancreatic inflammatory changes. Spleen: Unremarkable. Adrenals/Urinary Tract: Unenhanced appearance of the kidneys and bilateral adrenal glands is unremarkable. There is no hydroureteronephrosis. No urinary tract calculi. Unenhanced appearance of the urinary bladder is normal. Stomach/Bowel: Unenhanced appearance of the stomach is normal. There is no pathologic dilatation of small bowel or colon. The appendix is not confidently identified and may be surgically absent. Regardless, there are no inflammatory changes noted adjacent to the cecum to suggest the presence of an acute appendicitis at this time. Vascular/Lymphatic: Aortic atherosclerosis, without definite aneurysm identified in the abdominal or pelvic vasculature on today's noncontrast CT examination. No definite lymphadenopathy noted in the abdomen or pelvis. Reproductive: Prostate gland and seminal vesicles are unremarkable in appearance. No periprostatic  soft tissue mass. Other: No significant volume of ascites.  No pneumoperitoneum. Musculoskeletal: Extensive predominantly sclerotic lesions are again noted throughout the visualized axial and appendicular skeleton, essentially unchanged compared to the prior examination. Findings are again most confluent in the left ilium cephalad to the left acetabulum. No new pathologic fractures are identified. Severe bilateral hip joint osteoarthritis is again noted. IMPRESSION: 1. Today's study demonstrates little change compared to the recent prior examination. Specifically, partially calcified mediastinal and right hilar lymphadenopathy is unchanged, as are widespread osseous metastases. 2. Diffuse ground-glass attenuation and very mild interlobular septal thickening is again noted throughout the lungs bilaterally. There is also associated trace bilateral pleural effusions and/or chronic pleural thickening. Given the lack of cardiac enlargement, these findings likely reflect edema from lymphatic congestion. 3. Additional incidental findings, as above, similar prior examinations. Electronically Signed   By: Vinnie Langton M.D.   On: 03/29/2016 08:41   Ct Chest Wo Contrast  Result Date: 03/29/2016 CLINICAL DATA:  55 year old male with history of stage IV non-small cell lung cancer diagnosed in February 2016, with known bone metastases. New diagnosis of prostate cancer undergoing hormone therapy. EXAM: CT CHEST, ABDOMEN AND PELVIS WITHOUT CONTRAST TECHNIQUE: Multidetector CT imaging of the chest, abdomen and pelvis was performed following the standard protocol without IV contrast. COMPARISON:  CT of the chest, abdomen and pelvis 02/01/2016, as well as numerous older prior examinations. FINDINGS: CT CHEST FINDINGS Cardiovascular: Heart size is normal. There is no significant pericardial fluid, thickening or pericardial calcification. There is aortic atherosclerosis, as well as atherosclerosis of the great vessels of the  mediastinum and the coronary arteries, including calcified atherosclerotic plaque in the left anterior descending coronary arteries. Right internal jugular single-lumen porta cath with tip terminating in the right atrium. Mediastinum/Nodes: Previously noted partially calcified right paratracheal nodal mass is similar to the prior study measuring 3.9 x 2.8 cm on today's examination (previously 4.0 x 2.9 cm on 02/01/2016). Additional partially calcified right paratracheal and right hilar lymph nodes are also similar. Esophagus is unremarkable in appearance. No axillary lymphadenopathy. Lungs/Pleura: There is a background of mild diffuse ground-glass attenuation and interlobular septal thickening which is similar to the prior study. Calcified granuloma surrounded by area of architectural distortion in the left upper lobe there is unchanged. Similar appearing tear of architectural distortion in the dependent portion of the right lower lobe with central 4 mm noncalcified pulmonary nodule (image 79 of series 4), also similar to numerous prior examinations. Patchy areas of mild centrilobular and paraseptal emphysema. No new suspicious appearing pulmonary nodules or masses. Trace bilateral pleural effusions and/or pleural thickening most notably in the dependent portion of the thorax bilaterally, similar to the prior study. Musculoskeletal: Innumerable predominantly sclerotic lesions are again noted throughout the visualized axial and appendicular skeleton, overall very similar to prior study from 02/01/2016. The most extensive involvement noted in the spine is again at T11 with near confluent involvement of the entire vertebral body and extension into the posterior elements. CT ABDOMEN PELVIS FINDINGS Hepatobiliary: No definite cystic or solid hepatic lesions are identified on today's noncontrast CT examination. Unenhanced appearance of the gallbladder is normal. Pancreas: Small low-attenuation lesion in the head of the  pancreas is again similar to prior examinations, measuring 1.4 x 1.8 cm (image 67 of series 2). Adjacent to this there are several coarse calcifications in the head of the pancreas. There appears to be some very mild pancreatic ductal dilatation (4 mm) in the proximal body of the pancreas which is unchanged. No peripancreatic inflammatory changes. Spleen: Unremarkable. Adrenals/Urinary Tract: Unenhanced appearance of the kidneys and bilateral adrenal glands is unremarkable. There is no hydroureteronephrosis. No urinary tract calculi. Unenhanced appearance of the urinary bladder is normal. Stomach/Bowel: Unenhanced appearance of the stomach is normal. There is no pathologic dilatation of small bowel or colon. The appendix is not confidently identified and may be surgically absent. Regardless, there are no inflammatory changes noted adjacent to the cecum to suggest the presence of an acute appendicitis at this time. Vascular/Lymphatic: Aortic atherosclerosis, without definite aneurysm identified in the abdominal or pelvic vasculature on today's noncontrast CT examination. No definite lymphadenopathy noted in the abdomen or pelvis. Reproductive: Prostate gland and seminal vesicles are unremarkable in appearance. No periprostatic soft tissue mass. Other: No significant volume of ascites.  No pneumoperitoneum. Musculoskeletal: Extensive predominantly sclerotic lesions are again noted throughout the visualized axial and appendicular skeleton, essentially unchanged compared to the prior examination. Findings are again most confluent in the left ilium cephalad to the left acetabulum. No new pathologic fractures are identified. Severe bilateral hip joint osteoarthritis is again noted. IMPRESSION: 1. Today's study demonstrates little change compared to the recent prior examination. Specifically, partially calcified mediastinal and right hilar lymphadenopathy is unchanged, as are widespread osseous metastases. 2. Diffuse  ground-glass attenuation and very mild interlobular  septal thickening is again noted throughout the lungs bilaterally. There is also associated trace bilateral pleural effusions and/or chronic pleural thickening. Given the lack of cardiac enlargement, these findings likely reflect edema from lymphatic congestion. 3. Additional incidental findings, as above, similar prior examinations. Electronically Signed   By: Vinnie Langton M.D.   On: 03/29/2016 08:41   ASSESSMENT AND PLAN: This is a very pleasant 55 years old African-American male with:  1)  metastatic non-small cell lung cancer, adenocarcinoma status post induction chemotherapy with carboplatin and Alimta with initial good response followed by disease progression. The patient is currently on second line treatment with immunotherapy with Nivolumab status post 25 cycles. He tolerated the last cycle of his treatment fairly well with no concerning adverse effects.  I recommended for him to continue his treatment with immunotherapy with cycle #26 today as scheduled.  2) recently diagnosed prostate adenocarcinoma: He is followed at [facility] for this problem. He is currently on hormonal therapy with Lupron and Casodex. He is scheduled for the next injection on 03/15/2016.  3) pain management, he would continue on Percocet as needed. He was given a refill of Percocet today.  4) metastatic bone disease, he will be treated with Xgeva after receive dental clearance. He is still reluctant about new treatment. The patient would come back for follow-up visit in 2 weeks for reevaluation before starting cycle #27. He was advised to call immediately if he has any concerning symptoms in the interval. The patient voices understanding of current disease status and treatment options and is in agreement with the current care plan.  All questions were answered. The patient knows to call the clinic with any problems, questions or concerns. We can certainly see  the patient much sooner if necessary.  Disclaimer: This note was dictated with voice recognition software. Similar sounding words can inadvertently be transcribed and may not be corrected upon review.

## 2016-04-12 NOTE — Progress Notes (Signed)
Per Dr Julien Nordmann, ok to treat with BUN of 42 and Creat of 2.5, Potassium was 5.4, RN will redraw CMET, after treatment.

## 2016-04-12 NOTE — Progress Notes (Signed)
CBC/CMET reviewed by MD, ok to treat.  BMET will be repeated prior to discharge to confirm potassium level. Lab tube and label will be sent to the infusion room

## 2016-04-12 NOTE — Patient Instructions (Signed)
[facility] Discharge Instructions for Patients Receiving Chemotherapy  Today you received the following chemotherapy agents: Optivo  To help prevent nausea and vomiting after your treatment, we encourage you to take your nausea medication as directed.  If you develop nausea and vomiting that is not controlled by your nausea medication, call the clinic.   BELOW ARE SYMPTOMS THAT SHOULD BE REPORTED IMMEDIATELY:  *FEVER GREATER THAN 100.5 F  *CHILLS WITH OR WITHOUT FEVER  NAUSEA AND VOMITING THAT IS NOT CONTROLLED WITH YOUR NAUSEA MEDICATION  *UNUSUAL SHORTNESS OF BREATH  *UNUSUAL BRUISING OR BLEEDING  TENDERNESS IN MOUTH AND THROAT WITH OR WITHOUT PRESENCE OF ULCERS  *URINARY PROBLEMS  *BOWEL PROBLEMS  UNUSUAL RASH Items with * indicate a potential emergency and should be followed up as soon as possible.  Feel free to call the clinic you have any questions or concerns. The clinic phone number is [phone].  Please show the CHEMO ALERT CARD at check-in to the Emergency Department and triage nurse.   

## 2016-04-12 NOTE — Telephone Encounter (Signed)
Gave patient relative avs report and appointments for September and October.

## 2016-04-12 NOTE — Addendum Note (Signed)
Addended by: Lucile Crater on: 04/12/2016 09:19 AM   Modules accepted: Orders

## 2016-04-25 ENCOUNTER — Encounter: Payer: Self-pay | Admitting: Internal Medicine

## 2016-04-25 ENCOUNTER — Ambulatory Visit (HOSPITAL_BASED_OUTPATIENT_CLINIC_OR_DEPARTMENT_OTHER): Payer: Medicare Other | Admitting: Internal Medicine

## 2016-04-25 ENCOUNTER — Telehealth: Payer: Self-pay | Admitting: Internal Medicine

## 2016-04-25 ENCOUNTER — Other Ambulatory Visit: Payer: Self-pay | Admitting: Medical Oncology

## 2016-04-25 ENCOUNTER — Telehealth: Payer: Self-pay | Admitting: Medical Oncology

## 2016-04-25 ENCOUNTER — Other Ambulatory Visit (HOSPITAL_BASED_OUTPATIENT_CLINIC_OR_DEPARTMENT_OTHER): Payer: Medicare Other

## 2016-04-25 ENCOUNTER — Ambulatory Visit (HOSPITAL_BASED_OUTPATIENT_CLINIC_OR_DEPARTMENT_OTHER): Payer: Medicare Other

## 2016-04-25 DIAGNOSIS — C61 Malignant neoplasm of prostate: Secondary | ICD-10-CM

## 2016-04-25 DIAGNOSIS — Z5112 Encounter for antineoplastic immunotherapy: Secondary | ICD-10-CM

## 2016-04-25 DIAGNOSIS — G893 Neoplasm related pain (acute) (chronic): Secondary | ICD-10-CM

## 2016-04-25 DIAGNOSIS — C7951 Secondary malignant neoplasm of bone: Secondary | ICD-10-CM

## 2016-04-25 DIAGNOSIS — C7889 Secondary malignant neoplasm of other digestive organs: Secondary | ICD-10-CM | POA: Diagnosis not present

## 2016-04-25 DIAGNOSIS — C801 Malignant (primary) neoplasm, unspecified: Secondary | ICD-10-CM

## 2016-04-25 DIAGNOSIS — E875 Hyperkalemia: Secondary | ICD-10-CM

## 2016-04-25 DIAGNOSIS — C787 Secondary malignant neoplasm of liver and intrahepatic bile duct: Secondary | ICD-10-CM

## 2016-04-25 DIAGNOSIS — C3401 Malignant neoplasm of right main bronchus: Secondary | ICD-10-CM

## 2016-04-25 DIAGNOSIS — C3491 Malignant neoplasm of unspecified part of right bronchus or lung: Secondary | ICD-10-CM

## 2016-04-25 DIAGNOSIS — Z79899 Other long term (current) drug therapy: Secondary | ICD-10-CM | POA: Diagnosis not present

## 2016-04-25 DIAGNOSIS — R5382 Chronic fatigue, unspecified: Secondary | ICD-10-CM

## 2016-04-25 LAB — COMPREHENSIVE METABOLIC PANEL
ALBUMIN: 3.3 g/dL — AB (ref 3.5–5.0)
ALK PHOS: 133 U/L (ref 40–150)
ALT: 21 U/L (ref 0–55)
AST: 20 U/L (ref 5–34)
Anion Gap: 12 mEq/L — ABNORMAL HIGH (ref 3–11)
BUN: 31.6 mg/dL — AB (ref 7.0–26.0)
CO2: 17 mEq/L — ABNORMAL LOW (ref 22–29)
CREATININE: 2.2 mg/dL — AB (ref 0.7–1.3)
Calcium: 9.7 mg/dL (ref 8.4–10.4)
Chloride: 108 mEq/L (ref 98–109)
EGFR: 38 mL/min/{1.73_m2} — AB (ref >90)
GLUCOSE: 101 mg/dL (ref 70–140)
Potassium: 5.7 mEq/L — ABNORMAL HIGH (ref 3.5–5.1)
SODIUM: 137 meq/L (ref 136–145)
TOTAL PROTEIN: 8.7 g/dL — AB (ref 6.4–8.3)

## 2016-04-25 LAB — CBC WITH DIFFERENTIAL/PLATELET
BASO%: 0.3 % (ref 0.0–2.0)
Basophils Absolute: 0 10*3/uL (ref 0.0–0.1)
EOS ABS: 0.4 10*3/uL (ref 0.0–0.5)
EOS%: 3.1 % (ref 0.0–7.0)
HCT: 38.6 % (ref 38.4–49.9)
HEMOGLOBIN: 12.9 g/dL — AB (ref 13.0–17.1)
LYMPH%: 18.9 % (ref 14.0–49.0)
MCH: 31.5 pg (ref 27.2–33.4)
MCHC: 33.4 g/dL (ref 32.0–36.0)
MCV: 94.4 fL (ref 79.3–98.0)
MONO#: 0.8 10*3/uL (ref 0.1–0.9)
MONO%: 7.1 % (ref 0.0–14.0)
NEUT%: 70.6 % (ref 39.0–75.0)
NEUTROS ABS: 8.1 10*3/uL — AB (ref 1.5–6.5)
Platelets: 312 10*3/uL (ref 140–400)
RBC: 4.09 10*6/uL — ABNORMAL LOW (ref 4.20–5.82)
RDW: 15.6 % — AB (ref 11.0–14.6)
WBC: 11.5 10*3/uL — AB (ref 4.0–10.3)
lymph#: 2.2 10*3/uL (ref 0.9–3.3)

## 2016-04-25 LAB — TSH: TSH: 3.144 m(IU)/L (ref 0.320–4.118)

## 2016-04-25 MED ORDER — SODIUM POLYSTYRENE SULFONATE 15 GM/60ML PO SUSP: 30.0000 g | ORAL | 0 refills | Status: AC

## 2016-04-25 MED ORDER — OXYCODONE-ACETAMINOPHEN 5-325 MG PO TABS: 1.0000 | ORAL_TABLET | ORAL | 0 refills | Status: DC | PRN

## 2016-04-25 MED ORDER — SODIUM POLYSTYRENE SULFONATE 15 GM/60ML PO SUSP
30.0000 g | Freq: Once | ORAL | Status: DC
Start: 2016-04-25 — End: 2016-04-25

## 2016-04-25 MED ORDER — SODIUM CHLORIDE 0.9 % IV SOLN
Freq: Once | INTRAVENOUS | Status: AC
  Administered 2016-04-25: 09:00:00 via INTRAVENOUS

## 2016-04-25 MED ORDER — SODIUM CHLORIDE 0.9 % IV SOLN
240.0000 mg | Freq: Once | INTRAVENOUS | Status: AC
  Administered 2016-04-25: 240 mg via INTRAVENOUS
  Filled 2016-04-25: qty 20

## 2016-04-25 MED ORDER — SODIUM CHLORIDE 0.9 % IJ SOLN
10.0000 mL | INTRAMUSCULAR | Status: DC | PRN
  Administered 2016-04-25: 10 mL
  Filled 2016-04-25: qty 10

## 2016-04-25 MED ORDER — HEPARIN SOD (PORK) LOCK FLUSH 100 UNIT/ML IV SOLN
500.0000 [IU] | Freq: Once | INTRAVENOUS | Status: AC | PRN
  Administered 2016-04-25: 500 [IU]
  Filled 2016-04-25: qty 5

## 2016-04-25 NOTE — Telephone Encounter (Signed)
APPTS CONF WITH PATIENT. PATIENT REFUSED AVS REPORT AND APPT SCHD. 09/06/017

## 2016-04-25 NOTE — Progress Notes (Signed)
Per Dr. Mckinley Jewel, ok to treat with Potassium of 5.7 and Creat of 2.2.

## 2016-04-25 NOTE — Progress Notes (Signed)
[place] [phone]   [phone]  OFFICE PROGRESS NOTE  [name], [name], MD [place] [address]  DIAGNOSIS:  1) Stage IV (T2b, N2, M1b) non-small cell lung cancer, adenocarcinoma with negative EGFR mutation and negative gene translocation presented with a large right hilar mass as well as mediastinal lymphadenopathy and metastatic disease to the liver, bone and pancreas diagnosed in February 2016. 2) prostate adenocarcinoma diagnosed at [facility] with Gleason score 9 (4+5).  PRIOR THERAPY: Systemic chemotherapy with carboplatin for AUC of 5 and Alimta 500 MG/M2 every 3 weeks. Status post 6 cycles.  CURRENT THERAPY:  1) Immunotherapy with Nivolumab 240 MG every 2 weeks, status post 26 cycles. 2) Lupron and Casodex for the recently diagnosed prostate adenocarcinoma at [facility]: Francisco Love 55 y.o. male returns to the clinic today for follow-up visit accompanied by his brother-in-law. The patient is doing fine today with no specific complaints. The patient continues to tolerate his current treatment with immunotherapy with Nivolumab fairly well. He is status post 26 cycles. He denied having any significant nausea or vomiting. The patient denied having any fever or chills, no significant weight loss or night sweats. He has no chest pain, shortness of breath but has mild cough with no hemoptysis. He is here today for evaluation before starting cycle #27. He is requesting refill his pain medication.  MEDICAL HISTORY: Past Medical History:  Diagnosis Date  . Chronic fatigue 04/12/2016  . Chronic pain 04/12/2016  . Shortness of breath dyspnea     ALLERGIES:  has No Known Allergies.  MEDICATIONS:  Current Outpatient Prescriptions  Medication Sig Dispense Refill  . acetaminophen (TYLENOL) 325 MG tablet Take 2 tablets (650 mg total) by mouth every 6 (six) hours as needed for mild pain (or  Fever >/= 101).    Marland Kitchen albuterol (PROVENTIL) (2.5 MG/3ML) 0.083% nebulizer solution Take 3 mLs (2.5 mg total) by nebulization every 2 (two) hours as needed for wheezing. 75 mL 12  . AMITIZA 24 MCG capsule     . bicalutamide (CASODEX) 50 MG tablet Take 50 mg by mouth.    . clobetasol ointment (TEMOVATE) 0.05 % Apply topically.    . diphenhydrAMINE (BENADRYL) 25 MG tablet Take 25 mg by mouth.    . feeding supplement, ENSURE COMPLETE, (ENSURE COMPLETE) LIQD Take 237 mLs by mouth 3 (three) times daily between meals.    Marland Kitchen FLUOCINOLONE ACETONIDE BODY 0.01 % OIL     . levocetirizine (XYZAL) 5 MG tablet Take 5 mg by mouth.    . lidocaine-prilocaine (EMLA) cream Apply 1 application topically as needed. Apply to port site prior to chemotherapy. 30 g 0  . mirtazapine (REMERON) 15 MG tablet Take 1 tablet (15 mg total) by mouth at bedtime.    Marland Kitchen oxyCODONE-acetaminophen (PERCOCET/ROXICET) 5-325 MG tablet Take 1 tablet by mouth every 4 (four) hours as needed for severe pain. 60 tablet 0  . polyethylene glycol (MIRALAX / GLYCOLAX) packet Take 17 g by mouth daily.    Marland Kitchen senna-docusate (SENOKOT-S) 8.6-50 MG per tablet Take 1 tablet by mouth daily.    . tamsulosin (FLOMAX) 0.4 MG CAPS capsule     . triamcinolone ointment (KENALOG) 0.1 % Apply to arms twice a day until clear, then stop.    Marland Kitchen prochlorperazine (COMPAZINE) 10 MG tablet Take 1 tablet (10 mg total) by mouth every 6 (six) hours as needed for nausea or vomiting. (  Patient not taking: Reported on 04/25/2016) 30 tablet 1   No current facility-administered medications for this visit.     SURGICAL HISTORY:  Past Surgical History:  Procedure Laterality Date  . APPENDECTOMY    . VIDEO BRONCHOSCOPY N/A 09/21/2014   Procedure: VIDEO BRONCHOSCOPY WITH FLUORO;  Surgeon: Rigoberto Noel, MD;  Location: [facility];  Service: Cardiopulmonary;  Laterality: N/A;  . VIDEO BRONCHOSCOPY WITH ENDOBRONCHIAL ULTRASOUND N/A 09/23/2014   Procedure: VIDEO BRONCHOSCOPY WITH  ENDOBRONCHIAL ULTRASOUND;  Surgeon: Rigoberto Noel, MD;  Location: [facility];  Service: Pulmonary;  Laterality: N/A;    REVIEW OF SYSTEMS:  A comprehensive review of systems was negative except for: Musculoskeletal: positive for back pain   PHYSICAL EXAMINATION: General appearance: alert, cooperative, fatigued and no distress Head: Normocephalic, without obvious abnormality, atraumatic Neck: no adenopathy, no JVD, supple, symmetrical, trachea midline and thyroid not enlarged, symmetric, no tenderness/mass/nodules Lymph nodes: Cervical, supraclavicular, and axillary nodes normal. Resp: clear to auscultation bilaterally Back: symmetric, no curvature. ROM normal. No CVA tenderness. Cardio: regular rate and rhythm, S1, S2 normal, no murmur, click, rub or gallop GI: soft, non-tender; bowel sounds normal; no masses,  no organomegaly Extremities: extremities normal, atraumatic, no cyanosis or edema Neurologic: Alert and oriented X 3, normal strength and tone. Normal symmetric reflexes. Normal coordination and gait  ECOG PERFORMANCE STATUS: 1 - Symptomatic but completely ambulatory  Blood pressure 113/81, pulse 85, temperature 98.1 F (36.7 C), temperature source Oral, resp. rate 17, height 5' 9"  (1.753 m), weight 156 lb 12.8 oz (71.1 kg), SpO2 97 %.  LABORATORY DATA: Lab Results  Component Value Date   WBC 11.5 (H) 04/25/2016   HGB 12.9 (L) 04/25/2016   HCT 38.6 04/25/2016   MCV 94.4 04/25/2016   PLT 312 04/25/2016      Chemistry      Component Value Date/Time   NA 135 (L) 04/12/2016 0921   K 4.6 04/12/2016 0921   CL 108 10/25/2014 0845   CO2 15 (L) 04/12/2016 0921   BUN 39.6 (H) 04/12/2016 0921   CREATININE 2.3 (H) 04/12/2016 0921      Component Value Date/Time   CALCIUM 8.7 04/12/2016 0921   ALKPHOS 129 04/12/2016 0751   AST 15 04/12/2016 0751   ALT 18 04/12/2016 0751   BILITOT <0.30 04/12/2016 0751       RADIOGRAPHIC STUDIES: Ct Abdomen Pelvis Wo Contrast  Result Date:  03/29/2016 CLINICAL DATA:  55 year old male with history of stage IV non-small cell lung cancer diagnosed in February 2016, with known bone metastases. New diagnosis of prostate cancer undergoing hormone therapy. EXAM: CT CHEST, ABDOMEN AND PELVIS WITHOUT CONTRAST TECHNIQUE: Multidetector CT imaging of the chest, abdomen and pelvis was performed following the standard protocol without IV contrast. COMPARISON:  CT of the chest, abdomen and pelvis 02/01/2016, as well as numerous older prior examinations. FINDINGS: CT CHEST FINDINGS Cardiovascular: Heart size is normal. There is no significant pericardial fluid, thickening or pericardial calcification. There is aortic atherosclerosis, as well as atherosclerosis of the great vessels of the mediastinum and the coronary arteries, including calcified atherosclerotic plaque in the left anterior descending coronary arteries. Right internal jugular single-lumen porta cath with tip terminating in the right atrium. Mediastinum/Nodes: Previously noted partially calcified right paratracheal nodal mass is similar to the prior study measuring 3.9 x 2.8 cm on today's examination (previously 4.0 x 2.9 cm on 02/01/2016). Additional partially calcified right paratracheal and right hilar lymph nodes are also similar. Esophagus is unremarkable in appearance. No  axillary lymphadenopathy. Lungs/Pleura: There is a background of mild diffuse ground-glass attenuation and interlobular septal thickening which is similar to the prior study. Calcified granuloma surrounded by area of architectural distortion in the left upper lobe there is unchanged. Similar appearing tear of architectural distortion in the dependent portion of the right lower lobe with central 4 mm noncalcified pulmonary nodule (image 79 of series 4), also similar to numerous prior examinations. Patchy areas of mild centrilobular and paraseptal emphysema. No new suspicious appearing pulmonary nodules or masses. Trace bilateral  pleural effusions and/or pleural thickening most notably in the dependent portion of the thorax bilaterally, similar to the prior study. Musculoskeletal: Innumerable predominantly sclerotic lesions are again noted throughout the visualized axial and appendicular skeleton, overall very similar to prior study from 02/01/2016. The most extensive involvement noted in the spine is again at T11 with near confluent involvement of the entire vertebral body and extension into the posterior elements. CT ABDOMEN PELVIS FINDINGS Hepatobiliary: No definite cystic or solid hepatic lesions are identified on today's noncontrast CT examination. Unenhanced appearance of the gallbladder is normal. Pancreas: Small low-attenuation lesion in the head of the pancreas is again similar to prior examinations, measuring 1.4 x 1.8 cm (image 67 of series 2). Adjacent to this there are several coarse calcifications in the head of the pancreas. There appears to be some very mild pancreatic ductal dilatation (4 mm) in the proximal body of the pancreas which is unchanged. No peripancreatic inflammatory changes. Spleen: Unremarkable. Adrenals/Urinary Tract: Unenhanced appearance of the kidneys and bilateral adrenal glands is unremarkable. There is no hydroureteronephrosis. No urinary tract calculi. Unenhanced appearance of the urinary bladder is normal. Stomach/Bowel: Unenhanced appearance of the stomach is normal. There is no pathologic dilatation of small bowel or colon. The appendix is not confidently identified and may be surgically absent. Regardless, there are no inflammatory changes noted adjacent to the cecum to suggest the presence of an acute appendicitis at this time. Vascular/Lymphatic: Aortic atherosclerosis, without definite aneurysm identified in the abdominal or pelvic vasculature on today's noncontrast CT examination. No definite lymphadenopathy noted in the abdomen or pelvis. Reproductive: Prostate gland and seminal vesicles are  unremarkable in appearance. No periprostatic soft tissue mass. Other: No significant volume of ascites.  No pneumoperitoneum. Musculoskeletal: Extensive predominantly sclerotic lesions are again noted throughout the visualized axial and appendicular skeleton, essentially unchanged compared to the prior examination. Findings are again most confluent in the left ilium cephalad to the left acetabulum. No new pathologic fractures are identified. Severe bilateral hip joint osteoarthritis is again noted. IMPRESSION: 1. Today's study demonstrates little change compared to the recent prior examination. Specifically, partially calcified mediastinal and right hilar lymphadenopathy is unchanged, as are widespread osseous metastases. 2. Diffuse ground-glass attenuation and very mild interlobular septal thickening is again noted throughout the lungs bilaterally. There is also associated trace bilateral pleural effusions and/or chronic pleural thickening. Given the lack of cardiac enlargement, these findings likely reflect edema from lymphatic congestion. 3. Additional incidental findings, as above, similar prior examinations. Electronically Signed   By: Vinnie Langton M.D.   On: 03/29/2016 08:41   Ct Chest Wo Contrast  Result Date: 03/29/2016 CLINICAL DATA:  55 year old male with history of stage IV non-small cell lung cancer diagnosed in February 2016, with known bone metastases. New diagnosis of prostate cancer undergoing hormone therapy. EXAM: CT CHEST, ABDOMEN AND PELVIS WITHOUT CONTRAST TECHNIQUE: Multidetector CT imaging of the chest, abdomen and pelvis was performed following the standard protocol without IV contrast. COMPARISON:  CT of the chest, abdomen and pelvis 02/01/2016, as well as numerous older prior examinations. FINDINGS: CT CHEST FINDINGS Cardiovascular: Heart size is normal. There is no significant pericardial fluid, thickening or pericardial calcification. There is aortic atherosclerosis, as well as  atherosclerosis of the great vessels of the mediastinum and the coronary arteries, including calcified atherosclerotic plaque in the left anterior descending coronary arteries. Right internal jugular single-lumen porta cath with tip terminating in the right atrium. Mediastinum/Nodes: Previously noted partially calcified right paratracheal nodal mass is similar to the prior study measuring 3.9 x 2.8 cm on today's examination (previously 4.0 x 2.9 cm on 02/01/2016). Additional partially calcified right paratracheal and right hilar lymph nodes are also similar. Esophagus is unremarkable in appearance. No axillary lymphadenopathy. Lungs/Pleura: There is a background of mild diffuse ground-glass attenuation and interlobular septal thickening which is similar to the prior study. Calcified granuloma surrounded by area of architectural distortion in the left upper lobe there is unchanged. Similar appearing tear of architectural distortion in the dependent portion of the right lower lobe with central 4 mm noncalcified pulmonary nodule (image 79 of series 4), also similar to numerous prior examinations. Patchy areas of mild centrilobular and paraseptal emphysema. No new suspicious appearing pulmonary nodules or masses. Trace bilateral pleural effusions and/or pleural thickening most notably in the dependent portion of the thorax bilaterally, similar to the prior study. Musculoskeletal: Innumerable predominantly sclerotic lesions are again noted throughout the visualized axial and appendicular skeleton, overall very similar to prior study from 02/01/2016. The most extensive involvement noted in the spine is again at T11 with near confluent involvement of the entire vertebral body and extension into the posterior elements. CT ABDOMEN PELVIS FINDINGS Hepatobiliary: No definite cystic or solid hepatic lesions are identified on today's noncontrast CT examination. Unenhanced appearance of the gallbladder is normal. Pancreas: Small  low-attenuation lesion in the head of the pancreas is again similar to prior examinations, measuring 1.4 x 1.8 cm (image 67 of series 2). Adjacent to this there are several coarse calcifications in the head of the pancreas. There appears to be some very mild pancreatic ductal dilatation (4 mm) in the proximal body of the pancreas which is unchanged. No peripancreatic inflammatory changes. Spleen: Unremarkable. Adrenals/Urinary Tract: Unenhanced appearance of the kidneys and bilateral adrenal glands is unremarkable. There is no hydroureteronephrosis. No urinary tract calculi. Unenhanced appearance of the urinary bladder is normal. Stomach/Bowel: Unenhanced appearance of the stomach is normal. There is no pathologic dilatation of small bowel or colon. The appendix is not confidently identified and may be surgically absent. Regardless, there are no inflammatory changes noted adjacent to the cecum to suggest the presence of an acute appendicitis at this time. Vascular/Lymphatic: Aortic atherosclerosis, without definite aneurysm identified in the abdominal or pelvic vasculature on today's noncontrast CT examination. No definite lymphadenopathy noted in the abdomen or pelvis. Reproductive: Prostate gland and seminal vesicles are unremarkable in appearance. No periprostatic soft tissue mass. Other: No significant volume of ascites.  No pneumoperitoneum. Musculoskeletal: Extensive predominantly sclerotic lesions are again noted throughout the visualized axial and appendicular skeleton, essentially unchanged compared to the prior examination. Findings are again most confluent in the left ilium cephalad to the left acetabulum. No new pathologic fractures are identified. Severe bilateral hip joint osteoarthritis is again noted. IMPRESSION: 1. Today's study demonstrates little change compared to the recent prior examination. Specifically, partially calcified mediastinal and right hilar lymphadenopathy is unchanged, as are  widespread osseous metastases. 2. Diffuse ground-glass attenuation and very mild interlobular  septal thickening is again noted throughout the lungs bilaterally. There is also associated trace bilateral pleural effusions and/or chronic pleural thickening. Given the lack of cardiac enlargement, these findings likely reflect edema from lymphatic congestion. 3. Additional incidental findings, as above, similar prior examinations. Electronically Signed   By: Vinnie Langton M.D.   On: 03/29/2016 08:41   ASSESSMENT AND PLAN: This is a very pleasant 55 years old African-American male with:  1)  metastatic non-small cell lung cancer, adenocarcinoma status post induction chemotherapy with carboplatin and Alimta with initial good response followed by disease progression. The patient is currently on second line treatment with immunotherapy with Nivolumab status post 26 cycles. He is tolerating his treatment fairly well with no concerning adverse effects.  I recommended for him to continue his treatment with immunotherapy with cycle #27 today as scheduled.  2) recently diagnosed prostate adenocarcinoma: He is followed at [facility] for this problem. He is currently on hormonal therapy with Lupron and Casodex. He is scheduled for the next injection on 03/15/2016.  3) pain management, he would continue on Percocet as needed. He was given a refill of Percocet today.  4) metastatic bone disease, he will be treated with Xgeva after receive dental clearance. He is still reluctant about new treatment. The patient would come back for follow-up visit in 2 weeks for reevaluation before starting cycle #28. He was advised to call immediately if he has any concerning symptoms in the interval. The patient voices understanding of current disease status and treatment options and is in agreement with the current care plan.  All questions were answered. The patient knows to call the clinic with any problems, questions or  concerns. We can certainly see the patient much sooner if necessary.  Disclaimer: This note was dictated with voice recognition software. Similar sounding words can inadvertently be transcribed and may not be corrected upon review.

## 2016-04-25 NOTE — Telephone Encounter (Signed)
I gave Reita Cliche 's wife the message about pt high potassium and for pt to take kayexalate today.

## 2016-05-09 ENCOUNTER — Ambulatory Visit (HOSPITAL_BASED_OUTPATIENT_CLINIC_OR_DEPARTMENT_OTHER): Payer: Medicare Other

## 2016-05-09 ENCOUNTER — Telehealth: Payer: Self-pay | Admitting: Internal Medicine

## 2016-05-09 ENCOUNTER — Other Ambulatory Visit (HOSPITAL_BASED_OUTPATIENT_CLINIC_OR_DEPARTMENT_OTHER): Payer: Medicare Other

## 2016-05-09 ENCOUNTER — Telehealth: Payer: Self-pay

## 2016-05-09 ENCOUNTER — Ambulatory Visit (HOSPITAL_BASED_OUTPATIENT_CLINIC_OR_DEPARTMENT_OTHER): Payer: Medicare Other | Admitting: Nurse Practitioner

## 2016-05-09 DIAGNOSIS — C787 Secondary malignant neoplasm of liver and intrahepatic bile duct: Secondary | ICD-10-CM | POA: Diagnosis not present

## 2016-05-09 DIAGNOSIS — Z5112 Encounter for antineoplastic immunotherapy: Secondary | ICD-10-CM | POA: Diagnosis present

## 2016-05-09 DIAGNOSIS — C3491 Malignant neoplasm of unspecified part of right bronchus or lung: Secondary | ICD-10-CM

## 2016-05-09 DIAGNOSIS — C7889 Secondary malignant neoplasm of other digestive organs: Secondary | ICD-10-CM

## 2016-05-09 DIAGNOSIS — C3401 Malignant neoplasm of right main bronchus: Secondary | ICD-10-CM

## 2016-05-09 DIAGNOSIS — C7951 Secondary malignant neoplasm of bone: Secondary | ICD-10-CM

## 2016-05-09 DIAGNOSIS — C61 Malignant neoplasm of prostate: Secondary | ICD-10-CM | POA: Diagnosis not present

## 2016-05-09 DIAGNOSIS — C801 Malignant (primary) neoplasm, unspecified: Principal | ICD-10-CM

## 2016-05-09 DIAGNOSIS — G893 Neoplasm related pain (acute) (chronic): Secondary | ICD-10-CM

## 2016-05-09 DIAGNOSIS — R5382 Chronic fatigue, unspecified: Secondary | ICD-10-CM

## 2016-05-09 LAB — CBC WITH DIFFERENTIAL/PLATELET
BASO%: 0.5 % (ref 0.0–2.0)
BASOS ABS: 0.1 10*3/uL (ref 0.0–0.1)
EOS ABS: 0.5 10*3/uL (ref 0.0–0.5)
EOS%: 4.4 % (ref 0.0–7.0)
HEMATOCRIT: 37.7 % — AB (ref 38.4–49.9)
HGB: 12.4 g/dL — ABNORMAL LOW (ref 13.0–17.1)
LYMPH#: 2.4 10*3/uL (ref 0.9–3.3)
LYMPH%: 21.7 % (ref 14.0–49.0)
MCH: 31 pg (ref 27.2–33.4)
MCHC: 32.9 g/dL (ref 32.0–36.0)
MCV: 94.3 fL (ref 79.3–98.0)
MONO#: 1.2 10*3/uL — AB (ref 0.1–0.9)
MONO%: 10.5 % (ref 0.0–14.0)
NEUT#: 7 10*3/uL — ABNORMAL HIGH (ref 1.5–6.5)
NEUT%: 62.9 % (ref 39.0–75.0)
PLATELETS: 365 10*3/uL (ref 140–400)
RBC: 4 10*6/uL — ABNORMAL LOW (ref 4.20–5.82)
RDW: 15.3 % — ABNORMAL HIGH (ref 11.0–14.6)
WBC: 11.1 10*3/uL — ABNORMAL HIGH (ref 4.0–10.3)

## 2016-05-09 LAB — COMPREHENSIVE METABOLIC PANEL
ALT: 16 U/L (ref 0–55)
ANION GAP: 10 meq/L (ref 3–11)
AST: 13 U/L (ref 5–34)
Albumin: 3.1 g/dL — ABNORMAL LOW (ref 3.5–5.0)
Alkaline Phosphatase: 164 U/L — ABNORMAL HIGH (ref 40–150)
BILIRUBIN TOTAL: 0.21 mg/dL (ref 0.20–1.20)
BUN: 35.8 mg/dL — ABNORMAL HIGH (ref 7.0–26.0)
CALCIUM: 9.6 mg/dL (ref 8.4–10.4)
CHLORIDE: 115 meq/L — AB (ref 98–109)
CO2: 14 mEq/L — ABNORMAL LOW (ref 22–29)
CREATININE: 2.3 mg/dL — AB (ref 0.7–1.3)
EGFR: 36 mL/min/{1.73_m2} — ABNORMAL LOW (ref >90)
Glucose: 65 mg/dl — ABNORMAL LOW (ref 70–140)
Potassium: 4.9 mEq/L (ref 3.5–5.1)
Sodium: 139 mEq/L (ref 136–145)
Total Protein: 8.6 g/dL — ABNORMAL HIGH (ref 6.4–8.3)

## 2016-05-09 MED ORDER — SODIUM CHLORIDE 0.9 % IV SOLN
Freq: Once | INTRAVENOUS | Status: AC
  Administered 2016-05-09: 10:00:00 via INTRAVENOUS

## 2016-05-09 MED ORDER — SODIUM CHLORIDE 0.9 % IV SOLN
240.0000 mg | Freq: Once | INTRAVENOUS | Status: AC
  Administered 2016-05-09: 240 mg via INTRAVENOUS
  Filled 2016-05-09: qty 10

## 2016-05-09 MED ORDER — HEPARIN SOD (PORK) LOCK FLUSH 100 UNIT/ML IV SOLN
500.0000 [IU] | Freq: Once | INTRAVENOUS | Status: AC | PRN
  Administered 2016-05-09: 500 [IU]
  Filled 2016-05-09: qty 5

## 2016-05-09 MED ORDER — SODIUM CHLORIDE 0.9 % IJ SOLN
10.0000 mL | INTRAMUSCULAR | Status: DC | PRN
  Administered 2016-05-09: 10 mL
  Filled 2016-05-09: qty 10

## 2016-05-09 MED ORDER — OXYCODONE-ACETAMINOPHEN 5-325 MG PO TABS: 1.0000 | ORAL_TABLET | ORAL | 0 refills | Status: DC | PRN

## 2016-05-09 NOTE — Patient Instructions (Signed)
[facility] Discharge Instructions for Patients Receiving Chemotherapy  Today you received the following chemotherapy agents: optivo.  To help prevent nausea and vomiting after your treatment, we encourage you to take your nausea medication as directed, If you develop nausea and vomiting that is not controlled by your nausea medication, call the clinic.   BELOW ARE SYMPTOMS THAT SHOULD BE REPORTED IMMEDIATELY:  *FEVER GREATER THAN 100.5 F  *CHILLS WITH OR WITHOUT FEVER  NAUSEA AND VOMITING THAT IS NOT CONTROLLED WITH YOUR NAUSEA MEDICATION  *UNUSUAL SHORTNESS OF BREATH  *UNUSUAL BRUISING OR BLEEDING  TENDERNESS IN MOUTH AND THROAT WITH OR WITHOUT PRESENCE OF ULCERS  *URINARY PROBLEMS  *BOWEL PROBLEMS  UNUSUAL RASH Items with * indicate a potential emergency and should be followed up as soon as possible.  Feel free to call the clinic you have any questions or concerns. The clinic phone number is [phone].  Please show the [facility] at check-in to the Emergency Department and triage nurse.

## 2016-05-09 NOTE — Progress Notes (Signed)
RN checked with Marlynn Perking, ok to treat with CR 2.3.

## 2016-05-09 NOTE — Telephone Encounter (Signed)
Phone call from Shireen Quan, attorney. Is requesting information on patient Francisco Love DOB [birth year] for recent DWI. Information sent to [facility] in risk management.

## 2016-05-09 NOTE — Telephone Encounter (Signed)
Gave patient/relative avs report and appointments for October. Central radiology will call re scan - patent/relative aware.

## 2016-05-09 NOTE — Progress Notes (Signed)
  [facility] OFFICE PROGRESS NOTE   DIAGNOSIS:  1) Stage IV (T2b, N2, M1b) non-small cell lung cancer, adenocarcinoma with negative EGFR mutation and negative gene translocation presented with a large right hilar mass as well as mediastinal lymphadenopathy and metastatic disease to the liver, bone and pancreas diagnosed in February 2016. 2) prostate adenocarcinoma diagnosed at [facility] with Gleason score 9 (4+5).  PRIOR THERAPY: Systemic chemotherapy with carboplatin for AUC of 5 and Alimta 500 MG/M2 every 3 weeks. Status post 6 cycles.  CURRENT THERAPY:  1) Immunotherapy with Nivolumab 240 MG every 2 weeks, status post 27 cycles. 2) Lupron and Casodex for the recently diagnosed prostate adenocarcinoma at [facility]:   Francisco Love returns as scheduled. He completed cycle 27 nivolumab 04/25/2016. He denies nausea/vomiting. No mouth sores. No diarrhea. No rash. He denies shortness of breath. No cough. No fever. He has persistent bilateral hip pain. He takes Percocet as needed. His main complaint today is related to "anxiety attacks". He has a medication at home for anxiety. He is unable to recall the name of the medication.  Objective:  Vital signs in last 24 hours:  Blood pressure 119/89, pulse 98, temperature 98 F (36.7 C), temperature source Oral, resp. rate 17, height '5\' 9"'$  (1.753 m), weight 158 lb 1.6 oz (71.7 kg), SpO2 100 %.    HEENT: No thrush or ulcers. Resp: Lungs clear bilaterally. Cardio: Regular rate and rhythm. GI: Abdomen soft and nontender. No hepatomegaly. Vascular: No leg edema. Port-A-Cath without erythema.  Lab Results:  Lab Results  Component Value Date   WBC 11.1 (H) 05/09/2016   HGB 12.4 (L) 05/09/2016   HCT 37.7 (L) 05/09/2016   MCV 94.3 05/09/2016   PLT 365 05/09/2016   NEUTROABS 7.0 (H) 05/09/2016    Imaging:  No results found.  Medications: I have reviewed the patient's current  medications.  Assessment/Plan: 1. Metastatic non-small cell lung cancer, adenocarcinoma currently on active treatment with nivolumab status post 27 cycles. 2. Prostate cancer followed at [facility]. Currently on hormonal therapy with Lupron and Casodex. 3. Pain management. He takes Percocet as needed. 4. Metastatic bone disease. Plan for Xgeva after dental clearance received.   Disposition: Francisco Love appears stable. He has completed 27 cycles of nivolumab. Plan to proceed with cycle 28 today as scheduled. He will have restaging CT scans prior to his next visit in 2 weeks.  He was given a new perception for Percocet today.  He will follow-up with his primary care provider regarding anxiety.  He will return for a follow-up visit with Dr. Julien Nordmann on 05/24/2016. He will contact the office in the interim with any problems.  Plan reviewed with Dr. Julien Nordmann.    Ned Card ANP/GNP-BC   05/09/2016  8:53 AM

## 2016-05-24 ENCOUNTER — Encounter: Payer: Self-pay | Admitting: Internal Medicine

## 2016-05-24 ENCOUNTER — Other Ambulatory Visit (HOSPITAL_BASED_OUTPATIENT_CLINIC_OR_DEPARTMENT_OTHER): Payer: Medicare Other

## 2016-05-24 ENCOUNTER — Telehealth: Payer: Self-pay | Admitting: Internal Medicine

## 2016-05-24 ENCOUNTER — Ambulatory Visit (HOSPITAL_BASED_OUTPATIENT_CLINIC_OR_DEPARTMENT_OTHER): Payer: Medicare Other

## 2016-05-24 ENCOUNTER — Encounter (HOSPITAL_COMMUNITY): Payer: Self-pay

## 2016-05-24 ENCOUNTER — Ambulatory Visit (HOSPITAL_BASED_OUTPATIENT_CLINIC_OR_DEPARTMENT_OTHER): Payer: Medicare Other | Admitting: Internal Medicine

## 2016-05-24 ENCOUNTER — Ambulatory Visit (HOSPITAL_COMMUNITY)
Admission: RE | Admit: 2016-05-24 | Discharge: 2016-05-24 | Disposition: A | Discharge status: Home / Self Care | Payer: Medicare Other | Source: Ambulatory Visit | Attending: Nurse Practitioner | Admitting: Nurse Practitioner

## 2016-05-24 DIAGNOSIS — Z79899 Other long term (current) drug therapy: Secondary | ICD-10-CM

## 2016-05-24 DIAGNOSIS — C61 Malignant neoplasm of prostate: Secondary | ICD-10-CM | POA: Diagnosis not present

## 2016-05-24 DIAGNOSIS — Z5112 Encounter for antineoplastic immunotherapy: Secondary | ICD-10-CM | POA: Diagnosis not present

## 2016-05-24 DIAGNOSIS — C3490 Malignant neoplasm of unspecified part of unspecified bronchus or lung: Secondary | ICD-10-CM | POA: Diagnosis not present

## 2016-05-24 DIAGNOSIS — C3401 Malignant neoplasm of right main bronchus: Secondary | ICD-10-CM

## 2016-05-24 DIAGNOSIS — G893 Neoplasm related pain (acute) (chronic): Secondary | ICD-10-CM

## 2016-05-24 DIAGNOSIS — C7951 Secondary malignant neoplasm of bone: Secondary | ICD-10-CM | POA: Insufficient documentation

## 2016-05-24 DIAGNOSIS — C7989 Secondary malignant neoplasm of other specified sites: Secondary | ICD-10-CM | POA: Diagnosis not present

## 2016-05-24 DIAGNOSIS — C787 Secondary malignant neoplasm of liver and intrahepatic bile duct: Secondary | ICD-10-CM

## 2016-05-24 DIAGNOSIS — C3491 Malignant neoplasm of unspecified part of right bronchus or lung: Secondary | ICD-10-CM

## 2016-05-24 DIAGNOSIS — C801 Malignant (primary) neoplasm, unspecified: Secondary | ICD-10-CM

## 2016-05-24 DIAGNOSIS — R5382 Chronic fatigue, unspecified: Secondary | ICD-10-CM

## 2016-05-24 LAB — CBC WITH DIFFERENTIAL/PLATELET
BASO%: 0.4 % (ref 0.0–2.0)
BASOS ABS: 0.1 10*3/uL (ref 0.0–0.1)
EOS ABS: 0.4 10*3/uL (ref 0.0–0.5)
EOS%: 3.5 % (ref 0.0–7.0)
HCT: 37.8 % — ABNORMAL LOW (ref 38.4–49.9)
HGB: 12.6 g/dL — ABNORMAL LOW (ref 13.0–17.1)
LYMPH%: 18.8 % (ref 14.0–49.0)
MCH: 31.3 pg (ref 27.2–33.4)
MCHC: 33.3 g/dL (ref 32.0–36.0)
MCV: 93.8 fL (ref 79.3–98.0)
MONO#: 0.5 10*3/uL (ref 0.1–0.9)
MONO%: 4.8 % (ref 0.0–14.0)
NEUT#: 8.2 10*3/uL — ABNORMAL HIGH (ref 1.5–6.5)
NEUT%: 72.5 % (ref 39.0–75.0)
NRBC: 0 % (ref 0–0)
PLATELETS: 229 10*3/uL (ref 140–400)
RBC: 4.03 10*6/uL — AB (ref 4.20–5.82)
RDW: 15.6 % — AB (ref 11.0–14.6)
WBC: 11.3 10*3/uL — ABNORMAL HIGH (ref 4.0–10.3)
lymph#: 2.1 10*3/uL (ref 0.9–3.3)

## 2016-05-24 LAB — COMPREHENSIVE METABOLIC PANEL
ALBUMIN: 3.2 g/dL — AB (ref 3.5–5.0)
ALK PHOS: 166 U/L — AB (ref 40–150)
ALT: 13 U/L (ref 0–55)
ANION GAP: 12 meq/L — AB (ref 3–11)
AST: 16 U/L (ref 5–34)
BUN: 36.8 mg/dL — ABNORMAL HIGH (ref 7.0–26.0)
CO2: 14 meq/L — AB (ref 22–29)
Calcium: 9.3 mg/dL (ref 8.4–10.4)
Chloride: 112 mEq/L — ABNORMAL HIGH (ref 98–109)
Creatinine: 2.2 mg/dL — ABNORMAL HIGH (ref 0.7–1.3)
EGFR: 38 mL/min/{1.73_m2} — AB (ref >90)
Glucose: 122 mg/dl (ref 70–140)
Potassium: 4.3 mEq/L (ref 3.5–5.1)
Sodium: 138 mEq/L (ref 136–145)
TOTAL PROTEIN: 8.2 g/dL (ref 6.4–8.3)

## 2016-05-24 LAB — TECHNOLOGIST REVIEW

## 2016-05-24 LAB — TSH: TSH: 2.38 m(IU)/L (ref 0.320–4.118)

## 2016-05-24 MED ORDER — SODIUM CHLORIDE 0.9 % IV SOLN
240.0000 mg | Freq: Once | INTRAVENOUS | Status: AC
  Administered 2016-05-24: 240 mg via INTRAVENOUS
  Filled 2016-05-24: qty 20

## 2016-05-24 MED ORDER — OXYCODONE-ACETAMINOPHEN 5-325 MG PO TABS: 1.0000 | ORAL_TABLET | ORAL | 0 refills | Status: DC | PRN

## 2016-05-24 MED ORDER — SODIUM CHLORIDE 0.9 % IJ SOLN
10.0000 mL | INTRAMUSCULAR | Status: DC | PRN
  Administered 2016-05-24: 10 mL
  Filled 2016-05-24: qty 10

## 2016-05-24 MED ORDER — HEPARIN SOD (PORK) LOCK FLUSH 100 UNIT/ML IV SOLN
500.0000 [IU] | Freq: Once | INTRAVENOUS | Status: AC | PRN
  Administered 2016-05-24: 500 [IU]
  Filled 2016-05-24: qty 5

## 2016-05-24 MED ORDER — SODIUM CHLORIDE 0.9 % IV SOLN
Freq: Once | INTRAVENOUS | Status: AC
  Administered 2016-05-24: 10:00:00 via INTRAVENOUS

## 2016-05-24 NOTE — Progress Notes (Signed)
Francisco Love [phone]   [phone]  OFFICE PROGRESS NOTE  Francisco Love, [name], MD [place] [address]  DIAGNOSIS:  1) Stage IV (T2b, N2, M1b) non-small cell lung cancer, adenocarcinoma with negative EGFR mutation and negative gene translocation presented with a large right hilar mass as well as mediastinal lymphadenopathy and metastatic disease to the liver, bone and pancreas diagnosed in February 2016. 2) prostate adenocarcinoma diagnosed at [facility] with Gleason score 9 (4+5).  PRIOR THERAPY: Systemic chemotherapy with carboplatin for AUC of 5 and Alimta 500 MG/M2 every 3 weeks. Status post 6 cycles.  CURRENT THERAPY:  1) Immunotherapy with Nivolumab 240 MG every 2 weeks, status post 28 cycles. 2) Lupron and Casodex for the recently diagnosed prostate adenocarcinoma at [place]: Francisco Love 55 y.o. male returns to the clinic today for follow-up visit accompanied by his brother-in-law. The patient is doing fine today with no specific complaints. The patient continues to tolerate his current treatment with immunotherapy with Nivolumab fairly well. He is status post 28 cycles. He denied having any significant nausea or vomiting. The patient denied having any fever or chills, no significant weight loss or night sweats. He has no chest pain, shortness of breath but has mild cough with no hemoptysis. He had repeat CT scan of the chest, abdomen and pelvis performed earlier today and he is here for evaluation and discussion of his scan results before proceeding with the next cycle of chemotherapy. He is requesting refill his pain medication.  MEDICAL HISTORY: Past Medical History:  Diagnosis Date  . Chronic fatigue 04/12/2016  . Chronic pain 04/12/2016  . Shortness of breath dyspnea     ALLERGIES:  has No Known Allergies.  MEDICATIONS:  Current Outpatient Prescriptions  Medication Sig Dispense  Refill  . acetaminophen (TYLENOL) 325 MG tablet Take 2 tablets (650 mg total) by mouth every 6 (six) hours as needed for mild pain (or Fever >/= 101).    Marland Kitchen albuterol (PROVENTIL) (2.5 MG/3ML) 0.083% nebulizer solution Take 3 mLs (2.5 mg total) by nebulization every 2 (two) hours as needed for wheezing. 75 mL 12  . AMITIZA 24 MCG capsule     . bicalutamide (CASODEX) 50 MG tablet Take 50 mg by mouth.    . clobetasol ointment (TEMOVATE) 0.05 % Apply topically.    . diphenhydrAMINE (BENADRYL) 25 MG tablet Take 25 mg by mouth.    . feeding supplement, ENSURE COMPLETE, (ENSURE COMPLETE) LIQD Take 237 mLs by mouth 3 (three) times daily between meals.    Marland Kitchen FLUOCINOLONE ACETONIDE BODY 0.01 % OIL     . levocetirizine (XYZAL) 5 MG tablet Take 5 mg by mouth.    . lidocaine-prilocaine (EMLA) cream Apply 1 application topically as needed. Apply to port site prior to chemotherapy. 30 g 0  . mirtazapine (REMERON) 15 MG tablet Take 1 tablet (15 mg total) by mouth at bedtime.    Marland Kitchen oxyCODONE-acetaminophen (PERCOCET/ROXICET) 5-325 MG tablet Take 1 tablet by mouth every 4 (four) hours as needed for severe pain. 60 tablet 0  . polyethylene glycol (MIRALAX / GLYCOLAX) packet Take 17 g by mouth daily.    . prochlorperazine (COMPAZINE) 10 MG tablet Take 1 tablet (10 mg total) by mouth every 6 (six) hours as needed for nausea or vomiting. 30 tablet 1  . senna-docusate (SENOKOT-S) 8.6-50 MG per tablet Take 1 tablet by mouth daily.    . tamsulosin (  FLOMAX) 0.4 MG CAPS capsule     . triamcinolone ointment (KENALOG) 0.1 % Apply to arms twice a day until clear, then stop.     No current facility-administered medications for this visit.     SURGICAL HISTORY:  Past Surgical History:  Procedure Laterality Date  . APPENDECTOMY    . VIDEO BRONCHOSCOPY N/A 09/21/2014   Procedure: VIDEO BRONCHOSCOPY WITH FLUORO;  Surgeon: Rigoberto Noel, MD;  Location: [place];  Service: Cardiopulmonary;  Laterality: N/A;  . VIDEO  BRONCHOSCOPY WITH ENDOBRONCHIAL ULTRASOUND N/A 09/23/2014   Procedure: VIDEO BRONCHOSCOPY WITH ENDOBRONCHIAL ULTRASOUND;  Surgeon: Rigoberto Noel, MD;  Location: [place];  Service: Pulmonary;  Laterality: N/A;    REVIEW OF SYSTEMS:  Constitutional: positive for fatigue Eyes: negative Ears, nose, mouth, throat, and face: negative Respiratory: negative Cardiovascular: negative Gastrointestinal: negative Genitourinary:negative Integument/breast: negative Hematologic/lymphatic: negative Musculoskeletal:negative Neurological: negative Behavioral/Psych: negative Endocrine: negative Allergic/Immunologic: negative   PHYSICAL EXAMINATION: General appearance: alert, cooperative, fatigued and no distress Head: Normocephalic, without obvious abnormality, atraumatic Neck: no adenopathy, no JVD, supple, symmetrical, trachea midline and thyroid not enlarged, symmetric, no tenderness/mass/nodules Lymph nodes: Cervical, supraclavicular, and axillary nodes normal. Resp: clear to auscultation bilaterally Back: symmetric, no curvature. ROM normal. No CVA tenderness. Cardio: regular rate and rhythm, S1, S2 normal, no murmur, click, rub or gallop GI: soft, non-tender; bowel sounds normal; no masses,  no organomegaly Extremities: extremities normal, atraumatic, no cyanosis or edema Neurologic: Alert and oriented X 3, normal strength and tone. Normal symmetric reflexes. Normal coordination and gait  ECOG PERFORMANCE STATUS: 1 - Symptomatic but completely ambulatory  Blood pressure 100/63, pulse (!) 107, temperature 97.6 F (36.4 C), temperature source Oral, resp. rate 18, height _0  (1.753 m), weight 160 lb 4.8 oz (72.7 kg), SpO2 96 %.  LABORATORY DATA: Lab Results  Component Value Date   WBC 11.3 (H) 05/24/2016   HGB 12.6 (L) 05/24/2016   HCT 37.8 (L) 05/24/2016   MCV 93.8 05/24/2016   PLT 229 05/24/2016      Chemistry      Component Value Date/Time   NA 138 05/24/2016 0809   K 4.3 05/24/2016  0809   CL 108 10/25/2014 0845   CO2 14 (L) 05/24/2016 0809   BUN 36.8 (H) 05/24/2016 0809   CREATININE 2.2 (H) 05/24/2016 0809      Component Value Date/Time   CALCIUM 9.3 05/24/2016 0809   ALKPHOS 166 (H) 05/24/2016 0809   AST 16 05/24/2016 0809   ALT 13 05/24/2016 0809   BILITOT <0.30 05/24/2016 0809       RADIOGRAPHIC STUDIES: No results found. ASSESSMENT AND PLAN: This is a very pleasant 55 years old African-American male with:  1)  metastatic non-small cell lung cancer, adenocarcinoma status post induction chemotherapy with carboplatin and Alimta with initial good response followed by disease progression. The patient is currently on second line treatment with immunotherapy with Nivolumab status post 28 cycles. He is tolerating his treatment fairly well with no concerning adverse effects.  He had repeat CT scan of the chest, abdomen and pelvis performed earlier today. On review of the images I did not see any concerning findings for disease progression but the final report is still pending. I recommended for him to continue his treatment with immunotherapy with cycle #29 today as scheduled.  2) recently diagnosed prostate adenocarcinoma: He is [facility] at [facility] for this problem. He is currently on hormonal therapy with Lupron and Casodex. He is scheduled for the next  injection on 03/15/2016.  3) pain management, he would continue on Percocet as needed. He was given a refill of Percocet today.  4) metastatic bone disease, he will be treated with Xgeva after receive dental clearance. He is still reluctant about new treatment. The patient would come back for follow-up visit in 2 weeks for reevaluation before starting cycle #30. He was advised to call immediately if he has any concerning symptoms in the interval. The patient voices understanding of current disease status and treatment options and is in agreement with the current care plan.  All questions were answered. The  patient knows to call the clinic with any problems, questions or concerns. We can certainly see the patient much sooner if necessary.  Disclaimer: This note was dictated with voice recognition software. Similar sounding words can inadvertently be transcribed and may not be corrected upon review.

## 2016-05-24 NOTE — Patient Instructions (Signed)
[place] Discharge Instructions for Patients Receiving Chemotherapy  Today you received the following chemotherapy agents: optivo.  To help prevent nausea and vomiting after your treatment, we encourage you to take your nausea medication as directed, If you develop nausea and vomiting that is not controlled by your nausea medication, call the clinic.   BELOW ARE SYMPTOMS THAT SHOULD BE REPORTED IMMEDIATELY:  *FEVER GREATER THAN 100.5 F  *CHILLS WITH OR WITHOUT FEVER  NAUSEA AND VOMITING THAT IS NOT CONTROLLED WITH YOUR NAUSEA MEDICATION  *UNUSUAL SHORTNESS OF BREATH  *UNUSUAL BRUISING OR BLEEDING  TENDERNESS IN MOUTH AND THROAT WITH OR WITHOUT PRESENCE OF ULCERS  *URINARY PROBLEMS  *BOWEL PROBLEMS  UNUSUAL RASH Items with * indicate a potential emergency and should be followed up as soon as possible.  Feel free to call the clinic you have any questions or concerns. The clinic phone number is [phone].  Please show the [place] at check-in to the Emergency Department and triage nurse.

## 2016-05-24 NOTE — Progress Notes (Signed)
Per Dr Julien Nordmann ok to treat with Opdivo with Crt 2.2 and Heart rate 107

## 2016-05-24 NOTE — Telephone Encounter (Signed)
Gave patient avs report and appointments for October and November.  °

## 2016-05-31 ENCOUNTER — Telehealth: Payer: Self-pay | Admitting: *Deleted

## 2016-05-31 NOTE — Telephone Encounter (Signed)
FYI "This is Erie Noe. Atty [phone] for Ned Card.  I need an update on this patient."  Advised patients must sign a release of information for information provided only to patient.   "I've received letters so I believe something is on file. Could you have her call me.  He is to report to court today.  I know he is a lung cancer patient followed by Dr. Julien Nordmann.  It is a DWI from 2013 that is still open."    Observed 05-09-2016 letter "To whom it may concern". Will notify Risk Management.

## 2016-06-07 ENCOUNTER — Ambulatory Visit (HOSPITAL_BASED_OUTPATIENT_CLINIC_OR_DEPARTMENT_OTHER): Payer: Medicare Other

## 2016-06-07 ENCOUNTER — Encounter: Payer: Self-pay | Admitting: Internal Medicine

## 2016-06-07 ENCOUNTER — Ambulatory Visit (HOSPITAL_BASED_OUTPATIENT_CLINIC_OR_DEPARTMENT_OTHER): Payer: Medicare Other | Admitting: Internal Medicine

## 2016-06-07 ENCOUNTER — Telehealth: Payer: Self-pay | Admitting: *Deleted

## 2016-06-07 ENCOUNTER — Other Ambulatory Visit (HOSPITAL_BASED_OUTPATIENT_CLINIC_OR_DEPARTMENT_OTHER): Payer: Medicare Other

## 2016-06-07 ENCOUNTER — Telehealth: Payer: Self-pay | Admitting: Internal Medicine

## 2016-06-07 ENCOUNTER — Other Ambulatory Visit: Payer: Self-pay | Admitting: Internal Medicine

## 2016-06-07 VITALS — BP 129/95 | HR 92 | Temp 97.8°F | Resp 19 | Wt 162.1 lb

## 2016-06-07 DIAGNOSIS — C787 Secondary malignant neoplasm of liver and intrahepatic bile duct: Secondary | ICD-10-CM

## 2016-06-07 DIAGNOSIS — C3401 Malignant neoplasm of right main bronchus: Secondary | ICD-10-CM

## 2016-06-07 DIAGNOSIS — R5382 Chronic fatigue, unspecified: Secondary | ICD-10-CM

## 2016-06-07 DIAGNOSIS — G893 Neoplasm related pain (acute) (chronic): Secondary | ICD-10-CM

## 2016-06-07 DIAGNOSIS — C61 Malignant neoplasm of prostate: Secondary | ICD-10-CM

## 2016-06-07 DIAGNOSIS — Z23 Encounter for immunization: Secondary | ICD-10-CM

## 2016-06-07 DIAGNOSIS — C3491 Malignant neoplasm of unspecified part of right bronchus or lung: Secondary | ICD-10-CM

## 2016-06-07 DIAGNOSIS — C801 Malignant (primary) neoplasm, unspecified: Secondary | ICD-10-CM

## 2016-06-07 DIAGNOSIS — C7951 Secondary malignant neoplasm of bone: Secondary | ICD-10-CM | POA: Diagnosis not present

## 2016-06-07 DIAGNOSIS — C7889 Secondary malignant neoplasm of other digestive organs: Secondary | ICD-10-CM

## 2016-06-07 DIAGNOSIS — Z5112 Encounter for antineoplastic immunotherapy: Secondary | ICD-10-CM | POA: Diagnosis not present

## 2016-06-07 LAB — COMPREHENSIVE METABOLIC PANEL
ALK PHOS: 182 U/L — AB (ref 40–150)
ALT: 19 U/L (ref 0–55)
AST: 20 U/L (ref 5–34)
Albumin: 3.1 g/dL — ABNORMAL LOW (ref 3.5–5.0)
Anion Gap: 10 mEq/L (ref 3–11)
BUN: 38.8 mg/dL — ABNORMAL HIGH (ref 7.0–26.0)
CALCIUM: 9 mg/dL (ref 8.4–10.4)
CO2: 18 mEq/L — ABNORMAL LOW (ref 22–29)
Chloride: 113 mEq/L — ABNORMAL HIGH (ref 98–109)
Creatinine: 2.2 mg/dL — ABNORMAL HIGH (ref 0.7–1.3)
EGFR: 37 mL/min/{1.73_m2} — AB (ref >90)
Glucose: 86 mg/dl (ref 70–140)
POTASSIUM: 5 meq/L (ref 3.5–5.1)
Sodium: 141 mEq/L (ref 136–145)
Total Bilirubin: <0.22 mg/dL (ref 0.20–1.20)
Total Protein: 7.8 g/dL (ref 6.4–8.3)

## 2016-06-07 LAB — CBC WITH DIFFERENTIAL/PLATELET
BASO%: 0.7 % (ref 0.0–2.0)
BASOS ABS: 0.1 10*3/uL (ref 0.0–0.1)
EOS ABS: 0.5 10*3/uL (ref 0.0–0.5)
EOS%: 5.7 % (ref 0.0–7.0)
HEMATOCRIT: 35.8 % — AB (ref 38.4–49.9)
HEMOGLOBIN: 11.7 g/dL — AB (ref 13.0–17.1)
LYMPH#: 1.6 10*3/uL (ref 0.9–3.3)
LYMPH%: 18.1 % (ref 14.0–49.0)
MCH: 31.2 pg (ref 27.2–33.4)
MCHC: 32.7 g/dL (ref 32.0–36.0)
MCV: 95.4 fL (ref 79.3–98.0)
MONO#: 0.8 10*3/uL (ref 0.1–0.9)
MONO%: 8.9 % (ref 0.0–14.0)
NEUT#: 6.1 10*3/uL (ref 1.5–6.5)
NEUT%: 66.6 % (ref 39.0–75.0)
Platelets: 330 10*3/uL (ref 140–400)
RBC: 3.75 10*6/uL — ABNORMAL LOW (ref 4.20–5.82)
RDW: 15.9 % — AB (ref 11.0–14.6)
WBC: 9.1 10*3/uL (ref 4.0–10.3)

## 2016-06-07 MED ORDER — INFLUENZA VAC SPLIT QUAD 0.5 ML IM SUSY
0.5000 mL | PREFILLED_SYRINGE | Freq: Once | INTRAMUSCULAR | Status: DC
  Filled 2016-06-07: qty 0.5

## 2016-06-07 MED ORDER — OXYCODONE-ACETAMINOPHEN 5-325 MG PO TABS: 1.0000 | ORAL_TABLET | ORAL | 0 refills | Status: DC | PRN

## 2016-06-07 MED ORDER — HEPARIN SOD (PORK) LOCK FLUSH 100 UNIT/ML IV SOLN
500.0000 [IU] | Freq: Once | INTRAVENOUS | Status: AC | PRN
  Administered 2016-06-07: 500 [IU]
  Filled 2016-06-07: qty 5

## 2016-06-07 MED ORDER — INFLUENZA VAC SPLIT QUAD 0.5 ML IM SUSY
0.5000 mL | PREFILLED_SYRINGE | Freq: Once | INTRAMUSCULAR | Status: AC
  Administered 2016-06-07: 0.5 mL via INTRAMUSCULAR
  Filled 2016-06-07: qty 0.5

## 2016-06-07 MED ORDER — SODIUM CHLORIDE 0.9 % IV SOLN
Freq: Once | INTRAVENOUS | Status: AC
  Administered 2016-06-07: 11:00:00 via INTRAVENOUS

## 2016-06-07 MED ORDER — SODIUM CHLORIDE 0.9 % IV SOLN
240.0000 mg | Freq: Once | INTRAVENOUS | Status: AC
  Administered 2016-06-07: 240 mg via INTRAVENOUS
  Filled 2016-06-07: qty 20

## 2016-06-07 MED ORDER — SODIUM CHLORIDE 0.9 % IJ SOLN
10.0000 mL | INTRAMUSCULAR | Status: DC | PRN
  Administered 2016-06-07: 10 mL
  Filled 2016-06-07: qty 10

## 2016-06-07 NOTE — Patient Instructions (Signed)
[facility] Discharge Instructions for Patients Receiving Chemotherapy  Today you received the following chemotherapy agents:  Nivolumab.  To help prevent nausea and vomiting after your treatment, we encourage you to take your nausea medication as directed.   If you develop nausea and vomiting that is not controlled by your nausea medication, call the clinic.   BELOW ARE SYMPTOMS THAT SHOULD BE REPORTED IMMEDIATELY:  *FEVER GREATER THAN 100.5 F  *CHILLS WITH OR WITHOUT FEVER  NAUSEA AND VOMITING THAT IS NOT CONTROLLED WITH YOUR NAUSEA MEDICATION  *UNUSUAL SHORTNESS OF BREATH  *UNUSUAL BRUISING OR BLEEDING  TENDERNESS IN MOUTH AND THROAT WITH OR WITHOUT PRESENCE OF ULCERS  *URINARY PROBLEMS  *BOWEL PROBLEMS  UNUSUAL RASH Items with * indicate a potential emergency and should be followed up as soon as possible.  Feel free to call the clinic you have any questions or concerns. The clinic phone number is [phone].  Please show the CHEMO ALERT CARD at check-in to the Emergency Department and triage nurse.   

## 2016-06-07 NOTE — Addendum Note (Signed)
Addended by: Lucile Crater on: 06/07/2016 10:01 AM   Modules accepted: Orders

## 2016-06-07 NOTE — Telephone Encounter (Signed)
Per staff message and patient request I have moved appt from 11/2 to 11/01

## 2016-06-07 NOTE — Telephone Encounter (Signed)
Message sent to chemo scheduler to adjust chemo from 06/21/16 to 06/20/16, per patient reques, as per  appt at [facility]. 06/07/16

## 2016-06-07 NOTE — Progress Notes (Signed)
[place] [phone]   [phone]  OFFICE PROGRESS NOTE  [name], Francisco Heinrich, MD [place]  [id number]  DIAGNOSIS:  1) Stage IV (T2b, N2, M1b) non-small cell lung cancer, adenocarcinoma with negative EGFR mutation and negative gene translocation presented with a large right hilar mass as well as mediastinal lymphadenopathy and metastatic disease to the liver, bone and pancreas diagnosed in February 2016. 2) prostate adenocarcinoma diagnosed at [facility] with Gleason score 9 (4+5).  PRIOR THERAPY: Systemic chemotherapy with carboplatin for AUC of 5 and Alimta 500 MG/M2 every 3 weeks. Status post 6 cycles.  CURRENT THERAPY:  1) Immunotherapy with Nivolumab 240 MG every 2 weeks, status post 29 cycles. 2) Lupron and Casodex for the recently diagnosed prostate adenocarcinoma at [place]: [name] 55 y.o. male returns to the clinic today for follow-up visit accompanied by his brother-in-law. The patient is doing fine today with no specific complaints except for few seconds of confusion every now and then. The patient continues to tolerate his current treatment with immunotherapy with Nivolumab fairly well. He is status post 29 cycles. He denied having any significant nausea or vomiting. The patient denied having any fever or chills, no significant weight loss or night sweats. He has no chest pain, shortness of breath but has mild cough with no hemoptysis. He is here today to start cycle #30 of his treatment.  MEDICAL HISTORY: Past Medical History:  Diagnosis Date  . Chronic fatigue 04/12/2016  . Chronic pain 04/12/2016  . Shortness of breath dyspnea     ALLERGIES:  has No Known Allergies.  MEDICATIONS:  Current Outpatient Prescriptions  Medication Sig Dispense Refill  . acetaminophen (TYLENOL) 325 MG tablet Take 2 tablets (650 mg total) by mouth every 6 (six) hours as needed for mild pain  (or Fever >/= 101).    Marland Kitchen albuterol (PROVENTIL) (2.5 MG/3ML) 0.083% nebulizer solution Take 3 mLs (2.5 mg total) by nebulization every 2 (two) hours as needed for wheezing. 75 mL 12  . AMITIZA 24 MCG capsule     . bicalutamide (CASODEX) 50 MG tablet Take 50 mg by mouth.    . diphenhydrAMINE (BENADRYL) 25 MG tablet Take 25 mg by mouth.    . feeding supplement, ENSURE COMPLETE, (ENSURE COMPLETE) LIQD Take 237 mLs by mouth 3 (three) times daily between meals.    Marland Kitchen FLUOCINOLONE ACETONIDE BODY 0.01 % OIL     . levocetirizine (XYZAL) 5 MG tablet Take 5 mg by mouth.    . lidocaine-prilocaine (EMLA) cream Apply 1 application topically as needed. Apply to port site prior to chemotherapy. 30 g 0  . mirtazapine (REMERON) 15 MG tablet Take 1 tablet (15 mg total) by mouth at bedtime.    Marland Kitchen oxyCODONE-acetaminophen (PERCOCET/ROXICET) 5-325 MG tablet Take 1 tablet by mouth every 4 (four) hours as needed for severe pain. 60 tablet 0  . polyethylene glycol (MIRALAX / GLYCOLAX) packet Take 17 g by mouth daily.    . prochlorperazine (COMPAZINE) 10 MG tablet Take 1 tablet (10 mg total) by mouth every 6 (six) hours as needed for nausea or vomiting. 30 tablet 1  . senna-docusate (SENOKOT-S) 8.6-50 MG per tablet Take 1 tablet by mouth daily.    . SUMAtriptan (IMITREX) 25 MG tablet     . tamsulosin (FLOMAX) 0.4 MG CAPS capsule     . triamcinolone ointment (KENALOG) 0.1 % Apply to arms twice a day  until clear, then stop.    . valACYclovir (VALTREX) 1000 MG tablet      No current facility-administered medications for this visit.     SURGICAL HISTORY:  Past Surgical History:  Procedure Laterality Date  . APPENDECTOMY    . VIDEO BRONCHOSCOPY N/A 09/21/2014   Procedure: VIDEO BRONCHOSCOPY WITH FLUORO;  Surgeon: Rigoberto Noel, MD;  Location: [place];  Service: Cardiopulmonary;  Laterality: N/A;  . VIDEO BRONCHOSCOPY WITH ENDOBRONCHIAL ULTRASOUND N/A 09/23/2014   Procedure: VIDEO BRONCHOSCOPY WITH ENDOBRONCHIAL  ULTRASOUND;  Surgeon: Rigoberto Noel, MD;  Location: [place];  Service: Pulmonary;  Laterality: N/A;    REVIEW OF SYSTEMS:  A comprehensive review of systems was negative except for: Constitutional: positive for fatigue   PHYSICAL EXAMINATION: General appearance: alert, cooperative, fatigued and no distress Head: Normocephalic, without obvious abnormality, atraumatic Neck: no adenopathy, no JVD, supple, symmetrical, trachea midline and thyroid not enlarged, symmetric, no tenderness/mass/nodules Lymph nodes: Cervical, supraclavicular, and axillary nodes normal. Resp: clear to auscultation bilaterally Back: symmetric, no curvature. ROM normal. No CVA tenderness. Cardio: regular rate and rhythm, S1, S2 normal, no murmur, click, rub or gallop GI: soft, non-tender; bowel sounds normal; no masses,  no organomegaly Extremities: extremities normal, atraumatic, no cyanosis or edema Neurologic: Alert and oriented X 3, normal strength and tone. Normal symmetric reflexes. Normal coordination and gait  ECOG PERFORMANCE STATUS: 1 - Symptomatic but completely ambulatory  Blood pressure (!) 129/95, pulse 92, temperature 97.8 F (36.6 C), temperature source Oral, resp. rate 19, weight 162 lb 1.8 oz (73.5 kg), SpO2 100 %.  LABORATORY DATA: Lab Results  Component Value Date   WBC 9.1 06/07/2016   HGB 11.7 (L) 06/07/2016   HCT 35.8 (L) 06/07/2016   MCV 95.4 06/07/2016   PLT 330 06/07/2016      Chemistry      Component Value Date/Time   NA 138 05/24/2016 0809   K 4.3 05/24/2016 0809   CL 108 10/25/2014 0845   CO2 14 (L) 05/24/2016 0809   BUN 36.8 (H) 05/24/2016 0809   CREATININE 2.2 (H) 05/24/2016 0809      Component Value Date/Time   CALCIUM 9.3 05/24/2016 0809   ALKPHOS 166 (H) 05/24/2016 0809   AST 16 05/24/2016 0809   ALT 13 05/24/2016 0809   BILITOT <0.30 05/24/2016 0809       RADIOGRAPHIC STUDIES: Ct Abdomen Pelvis Wo Contrast  Result Date: 05/24/2016 CLINICAL DATA:  Lung cancer  EXAM: CT CHEST, ABDOMEN AND PELVIS WITHOUT CONTRAST TECHNIQUE: Multidetector CT imaging of the chest, abdomen and pelvis was performed following the standard protocol without IV contrast. COMPARISON:  03/29/2016 FINDINGS: CT CHEST FINDINGS Cardiovascular: Normal heart size. Calcification within the LAD coronary artery noted. No pericardial effusion. Mediastinum/Nodes: The trachea appears patent and is midline. Normal appearance of the esophagus. Right paratracheal lymph node measures 3.7 x 2.7 cm, image 18 of series 2. Previously 3.9 x 2.8 cm. Similar appearance of partially calcified low right paratracheal and right hilar lymph nodes. No axillary or supraclavicular adenopathy. Lungs/Pleura: No pleural effusions identified. Mild changes of centrilobular emphysema identified. Small bilateral pleural effusions/ thickening noted. Background of mild diffuse ground-glass attenuation and interlobular septal thickening is identified. This is unchanged from the previous study. Similar appearance of calcified granuloma within the left upper lobe, image 56 of series 4. Right lower lobe noncalcified pulmonary nodule measures 5 mm, image 76 of series 4. Previously 4 mm. Musculoskeletal: Extensive multifocal sclerotic bone metastases are identified bilaterally. The appearance is  similar to previous exam. CT ABDOMEN PELVIS FINDINGS Hepatobiliary: No focal liver abnormality is seen. No gallstones, gallbladder wall thickening, or biliary dilatation. Pancreas: Calcifications are again identified within the head of pancreas. Low-attenuation structure within the uncinate process is unchanged measuring 1.3 x 1.8 cm, image 66 of series 2. No inflammatory change identified. Spleen: Normal in size without focal abnormality. Adrenals/Urinary Tract: The adrenal glands are normal. Unremarkable appearance of the kidneys. No mass or hydronephrosis. The urinary bladder is normal. Stomach/Bowel: The stomach is within normal limits. The small  bowel loops have a normal course and caliber. No obstruction. Vascular/Lymphatic: Calcified atherosclerotic disease involves the abdominal aorta. No aneurysm. No enlarged retroperitoneal or mesenteric adenopathy. No enlarged pelvic or inguinal lymph nodes. Reproductive: Prostate is unremarkable. Other: No free fluid or fluid collections. Musculoskeletal: Extensive multifocal bone metastases are identified. Advanced osteoarthritis involves both hips. The overall appearance is similar to previous exam. IMPRESSION: 1. No significant interval change compared with previous exam. 2. Calcified and noncalcified mediastinal lymph nodes are similar to previous exam. 3. Stable widespread osseous metastasis. 4. Similar appearance of diffuse ground-glass attenuation and mild interlobular septal thickening throughout the lungs bilaterally. Electronically Signed   By: Kerby Moors M.D.   On: 05/24/2016 09:57   Ct Chest Wo Contrast  Result Date: 05/24/2016 CLINICAL DATA:  Lung cancer EXAM: CT CHEST, ABDOMEN AND PELVIS WITHOUT CONTRAST TECHNIQUE: Multidetector CT imaging of the chest, abdomen and pelvis was performed following the standard protocol without IV contrast. COMPARISON:  03/29/2016 FINDINGS: CT CHEST FINDINGS Cardiovascular: Normal heart size. Calcification within the LAD coronary artery noted. No pericardial effusion. Mediastinum/Nodes: The trachea appears patent and is midline. Normal appearance of the esophagus. Right paratracheal lymph node measures 3.7 x 2.7 cm, image 18 of series 2. Previously 3.9 x 2.8 cm. Similar appearance of partially calcified low right paratracheal and right hilar lymph nodes. No axillary or supraclavicular adenopathy. Lungs/Pleura: No pleural effusions identified. Mild changes of centrilobular emphysema identified. Small bilateral pleural effusions/ thickening noted. Background of mild diffuse ground-glass attenuation and interlobular septal thickening is identified. This is unchanged  from the previous study. Similar appearance of calcified granuloma within the left upper lobe, image 56 of series 4. Right lower lobe noncalcified pulmonary nodule measures 5 mm, image 76 of series 4. Previously 4 mm. Musculoskeletal: Extensive multifocal sclerotic bone metastases are identified bilaterally. The appearance is similar to previous exam. CT ABDOMEN PELVIS FINDINGS Hepatobiliary: No focal liver abnormality is seen. No gallstones, gallbladder wall thickening, or biliary dilatation. Pancreas: Calcifications are again identified within the head of pancreas. Low-attenuation structure within the uncinate process is unchanged measuring 1.3 x 1.8 cm, image 66 of series 2. No inflammatory change identified. Spleen: Normal in size without focal abnormality. Adrenals/Urinary Tract: The adrenal glands are normal. Unremarkable appearance of the kidneys. No mass or hydronephrosis. The urinary bladder is normal. Stomach/Bowel: The stomach is within normal limits. The small bowel loops have a normal course and caliber. No obstruction. Vascular/Lymphatic: Calcified atherosclerotic disease involves the abdominal aorta. No aneurysm. No enlarged retroperitoneal or mesenteric adenopathy. No enlarged pelvic or inguinal lymph nodes. Reproductive: Prostate is unremarkable. Other: No free fluid or fluid collections. Musculoskeletal: Extensive multifocal bone metastases are identified. Advanced osteoarthritis involves both hips. The overall appearance is similar to previous exam. IMPRESSION: 1. No significant interval change compared with previous exam. 2. Calcified and noncalcified mediastinal lymph nodes are similar to previous exam. 3. Stable widespread osseous metastasis. 4. Similar appearance of diffuse ground-glass attenuation and mild  interlobular septal thickening throughout the lungs bilaterally. Electronically Signed   By: Kerby Moors M.D.   On: 05/24/2016 09:57   ASSESSMENT AND PLAN: This is a very pleasant 56  years old African-American male with:  1)  metastatic non-small cell lung cancer, adenocarcinoma status post induction chemotherapy with carboplatin and Alimta with initial good response followed by disease progression. The patient is currently on second line treatment with immunotherapy with Nivolumab status post 29 cycles. He is tolerating his treatment fairly well with no concerning adverse effects.  I recommended for him to continue his treatment with immunotherapy with cycle #30 today as scheduled.  2) recently diagnosed prostate adenocarcinoma: He is followed at [facility] for this problem. He is currently on hormonal therapy with Lupron and Casodex. He is scheduled for the next injection on 03/15/2016.  3) pain management, he would continue on Percocet as needed. He was given a refill of Percocet today.  4) metastatic bone disease, he will be treated with Xgeva after receive dental clearance. He is still reluctant about new treatment. The patient would come back for follow-up visit in 2 weeks for reevaluation before starting cycle #31. He will receive flu shot today. He was advised to call immediately if he has any concerning symptoms in the interval. The patient voices understanding of current disease status and treatment options and is in agreement with the current care plan.  All questions were answered. The patient knows to call the clinic with any problems, questions or concerns. We can certainly see the patient much sooner if necessary.  Disclaimer: This note was dictated with voice recognition software. Similar sounding words can inadvertently be transcribed and may not be corrected upon review.

## 2016-06-09 ENCOUNTER — Telehealth: Payer: Self-pay | Admitting: Internal Medicine

## 2016-06-09 NOTE — Telephone Encounter (Signed)
PATIENT ALREADY ON SCHEDULE FOR Q2W LAB/FU/TX THRU END OF November.

## 2016-06-20 ENCOUNTER — Other Ambulatory Visit: Payer: Self-pay | Admitting: *Deleted

## 2016-06-20 ENCOUNTER — Other Ambulatory Visit (HOSPITAL_BASED_OUTPATIENT_CLINIC_OR_DEPARTMENT_OTHER): Payer: Medicare Other

## 2016-06-20 ENCOUNTER — Ambulatory Visit (HOSPITAL_BASED_OUTPATIENT_CLINIC_OR_DEPARTMENT_OTHER): Payer: Medicare Other

## 2016-06-20 ENCOUNTER — Ambulatory Visit (HOSPITAL_BASED_OUTPATIENT_CLINIC_OR_DEPARTMENT_OTHER): Payer: Medicare Other | Admitting: Nurse Practitioner

## 2016-06-20 VITALS — BP 118/83 | HR 93 | Temp 98.0°F | Resp 18 | Ht 69.0 in | Wt 162.4 lb

## 2016-06-20 DIAGNOSIS — Z79899 Other long term (current) drug therapy: Secondary | ICD-10-CM | POA: Diagnosis not present

## 2016-06-20 DIAGNOSIS — Z5112 Encounter for antineoplastic immunotherapy: Secondary | ICD-10-CM

## 2016-06-20 DIAGNOSIS — C7951 Secondary malignant neoplasm of bone: Secondary | ICD-10-CM

## 2016-06-20 DIAGNOSIS — C3401 Malignant neoplasm of right main bronchus: Secondary | ICD-10-CM

## 2016-06-20 DIAGNOSIS — C801 Malignant (primary) neoplasm, unspecified: Principal | ICD-10-CM

## 2016-06-20 DIAGNOSIS — R5382 Chronic fatigue, unspecified: Secondary | ICD-10-CM

## 2016-06-20 DIAGNOSIS — G893 Neoplasm related pain (acute) (chronic): Secondary | ICD-10-CM

## 2016-06-20 DIAGNOSIS — C3491 Malignant neoplasm of unspecified part of right bronchus or lung: Secondary | ICD-10-CM

## 2016-06-20 DIAGNOSIS — C787 Secondary malignant neoplasm of liver and intrahepatic bile duct: Secondary | ICD-10-CM | POA: Diagnosis not present

## 2016-06-20 DIAGNOSIS — C61 Malignant neoplasm of prostate: Secondary | ICD-10-CM

## 2016-06-20 DIAGNOSIS — C7889 Secondary malignant neoplasm of other digestive organs: Secondary | ICD-10-CM | POA: Diagnosis not present

## 2016-06-20 LAB — CBC WITH DIFFERENTIAL/PLATELET
BASO%: 0.1 % (ref 0.0–2.0)
Basophils Absolute: 0 10*3/uL (ref 0.0–0.1)
EOS%: 5.1 % (ref 0.0–7.0)
Eosinophils Absolute: 0.6 10*3/uL — ABNORMAL HIGH (ref 0.0–0.5)
HCT: 36.8 % — ABNORMAL LOW (ref 38.4–49.9)
HGB: 11.7 g/dL — ABNORMAL LOW (ref 13.0–17.1)
LYMPH%: 19 % (ref 14.0–49.0)
MCH: 30.3 pg (ref 27.2–33.4)
MCHC: 31.9 g/dL — AB (ref 32.0–36.0)
MCV: 94.8 fL (ref 79.3–98.0)
MONO#: 1.1 10*3/uL — ABNORMAL HIGH (ref 0.1–0.9)
MONO%: 9.6 % (ref 0.0–14.0)
NEUT#: 7.9 10*3/uL — ABNORMAL HIGH (ref 1.5–6.5)
NEUT%: 66.2 % (ref 39.0–75.0)
Platelets: 321 10*3/uL (ref 140–400)
RBC: 3.88 10*6/uL — AB (ref 4.20–5.82)
RDW: 15.7 % — ABNORMAL HIGH (ref 11.0–14.6)
WBC: 12 10*3/uL — ABNORMAL HIGH (ref 4.0–10.3)
lymph#: 2.3 10*3/uL (ref 0.9–3.3)

## 2016-06-20 LAB — COMPREHENSIVE METABOLIC PANEL
ALT: 23 U/L (ref 0–55)
ANION GAP: 10 meq/L (ref 3–11)
AST: 20 U/L (ref 5–34)
Albumin: 3.1 g/dL — ABNORMAL LOW (ref 3.5–5.0)
Alkaline Phosphatase: 196 U/L — ABNORMAL HIGH (ref 40–150)
BUN: 42.8 mg/dL — ABNORMAL HIGH (ref 7.0–26.0)
CHLORIDE: 113 meq/L — AB (ref 98–109)
CO2: 16 meq/L — AB (ref 22–29)
CREATININE: 2.3 mg/dL — AB (ref 0.7–1.3)
Calcium: 9.4 mg/dL (ref 8.4–10.4)
EGFR: 35 mL/min/{1.73_m2} — ABNORMAL LOW (ref >90)
Glucose: 75 mg/dl (ref 70–140)
Potassium: 5 mEq/L (ref 3.5–5.1)
Sodium: 139 mEq/L (ref 136–145)
Total Bilirubin: 0.23 mg/dL (ref 0.20–1.20)
Total Protein: 8.2 g/dL (ref 6.4–8.3)

## 2016-06-20 LAB — TSH: TSH: 1.878 m[IU]/L (ref 0.320–4.118)

## 2016-06-20 MED ORDER — NIVOLUMAB CHEMO INJECTION 100 MG/10ML
240.0000 mg | Freq: Once | INTRAVENOUS | Status: AC
  Administered 2016-06-20: 240 mg via INTRAVENOUS
  Filled 2016-06-20: qty 4

## 2016-06-20 MED ORDER — HEPARIN SOD (PORK) LOCK FLUSH 100 UNIT/ML IV SOLN
500.0000 [IU] | Freq: Once | INTRAVENOUS | Status: AC | PRN
  Administered 2016-06-20: 500 [IU]
  Filled 2016-06-20: qty 5

## 2016-06-20 MED ORDER — SODIUM CHLORIDE 0.9 % IJ SOLN
10.0000 mL | INTRAMUSCULAR | Status: DC | PRN
  Administered 2016-06-20: 10 mL
  Filled 2016-06-20: qty 10

## 2016-06-20 MED ORDER — SODIUM CHLORIDE 0.9 % IV SOLN
Freq: Once | INTRAVENOUS | Status: AC
Start: 2016-06-20 — End: 2016-06-20
  Administered 2016-06-20: 10:00:00 via INTRAVENOUS

## 2016-06-20 MED ORDER — OXYCODONE-ACETAMINOPHEN 5-325 MG PO TABS: 1.0000 | ORAL_TABLET | ORAL | 0 refills | Status: DC | PRN

## 2016-06-20 NOTE — Progress Notes (Signed)
[place] [phone]) [phone]   Fax:([phone]) [phone]  OFFICE PROGRESS NOTE  [name], [name], MD [place] [address]  DIAGNOSIS:  1) Stage IV (T2b, N2, M1b) non-small cell lung cancer, adenocarcinoma with negative EGFR mutation and negative gene translocation presented with a large right hilar mass as well as mediastinal lymphadenopathy and metastatic disease to the liver, bone and pancreas diagnosed in February 2016. 2) prostate adenocarcinoma diagnosed at [facility] with Gleason score 9 (4+5).  PRIOR THERAPY: Systemic chemotherapy with carboplatin for AUC of 5 and Alimta 500 MG/M2 every 3 weeks. Status post 6 cycles.  CURRENT THERAPY:  1) Immunotherapy with Nivolumab 240 MG every 2 weeks, status post 30 cycles. 2) Lupron and Casodex for the recently diagnosed prostate adenocarcinoma at [place]: Francisco Love 55 y.o. male returns to the clinic today for follow-up visit accompanied by his brother-in-law. The patient is doing fine today with no specific complaints except for occasionally waking up at night. The patient continues to tolerate his current treatment with immunotherapy with Nivolumab fairly well. He is status post 30 cycles. He denied having any significant nausea or vomiting. The patient denied having any fever or chills, no significant weight loss or night sweats. He has no chest pain, shortness of breath but has mild cough with no hemoptysis. He is here today to start cycle #31 of his treatment. He has not yet seen his dentist for clearance prior to [place], but was encouraged to.  He will receive Lupron and Casodex shot tomorrow at [facility].  MEDICAL HISTORY: Past Medical History:  Diagnosis Date  . Chronic fatigue 04/12/2016  . Chronic pain 04/12/2016  . Shortness of breath dyspnea     ALLERGIES:  has No Known Allergies.  MEDICATIONS:  Current Outpatient Prescriptions  Medication Sig  Dispense Refill  . acetaminophen (TYLENOL) 325 MG tablet Take 2 tablets (650 mg total) by mouth every 6 (six) hours as needed for mild pain (or Fever >/= 101).    Marland Kitchen albuterol (PROVENTIL) (2.5 MG/3ML) 0.083% nebulizer solution Take 3 mLs (2.5 mg total) by nebulization every 2 (two) hours as needed for wheezing. 75 mL 12  . AMITIZA 24 MCG capsule     . bicalutamide (CASODEX) 50 MG tablet Take 50 mg by mouth.    . diphenhydrAMINE (BENADRYL) 25 MG tablet Take 25 mg by mouth.    . feeding supplement, ENSURE COMPLETE, (ENSURE COMPLETE) LIQD Take 237 mLs by mouth 3 (three) times daily between meals.    Marland Kitchen FLUOCINOLONE ACETONIDE BODY 0.01 % OIL     . levocetirizine (XYZAL) 5 MG tablet Take 5 mg by mouth.    . lidocaine-prilocaine (EMLA) cream Apply 1 application topically as needed. Apply to port site prior to chemotherapy. 30 g 0  . mirtazapine (REMERON) 15 MG tablet Take 1 tablet (15 mg total) by mouth at bedtime.    Marland Kitchen oxyCODONE-acetaminophen (PERCOCET/ROXICET) 5-325 MG tablet Take 1 tablet by mouth every 4 (four) hours as needed for severe pain. 60 tablet 0  . polyethylene glycol (MIRALAX / GLYCOLAX) packet Take 17 g by mouth daily.    . prochlorperazine (COMPAZINE) 10 MG tablet Take 1 tablet (10 mg total) by mouth every 6 (six) hours as needed for nausea or vomiting. 30 tablet 1  . senna-docusate (SENOKOT-S) 8.6-50 MG per tablet Take 1 tablet by mouth daily.    . SUMAtriptan (IMITREX) 25 MG tablet     .  tamsulosin (FLOMAX) 0.4 MG CAPS capsule     . triamcinolone ointment (KENALOG) 0.1 % Apply to arms twice a day until clear, then stop.    . valACYclovir (VALTREX) 1000 MG tablet      No current facility-administered medications for this visit.    Facility-Administered Medications Ordered in Other Visits  Medication Dose Route Frequency Provider Last Rate Last Dose  . 0.9 %  sodium chloride infusion   Intravenous Once Curt Bears, MD      . heparin lock flush 100 unit/mL  500 Units  Intracatheter Once PRN Curt Bears, MD      . nivolumab (OPDIVO) 240 mg in sodium chloride 0.9 % 100 mL chemo infusion  240 mg Intravenous Once Curt Bears, MD 124 mL/hr at 06/20/16 1037 240 mg at 06/20/16 1037  . sodium chloride 0.9 % injection 10 mL  10 mL Intracatheter PRN Curt Bears, MD        SURGICAL HISTORY:  Past Surgical History:  Procedure Laterality Date  . APPENDECTOMY    . VIDEO BRONCHOSCOPY N/A 09/21/2014   Procedure: VIDEO BRONCHOSCOPY WITH FLUORO;  Surgeon: Rigoberto Noel, MD;  Location: [place];  Service: Cardiopulmonary;  Laterality: N/A;  . VIDEO BRONCHOSCOPY WITH ENDOBRONCHIAL ULTRASOUND N/A 09/23/2014   Procedure: VIDEO BRONCHOSCOPY WITH ENDOBRONCHIAL ULTRASOUND;  Surgeon: Rigoberto Noel, MD;  Location: [place];  Service: Pulmonary;  Laterality: N/A;    REVIEW OF SYSTEMS:  A comprehensive review of systems was negative except for: Constitutional: positive for fatigue   PHYSICAL EXAMINATION  General appearance: alert, cooperative, fatigued and no distressResp: clear to auscultation bilaterally Back: symmetric, no curvature. ROM normal. No CVA tenderness. Cardio: regular rate and rhythm, S1, S2 normal, no murmur, click, rub or gallop Extremities: extremities normal, atraumatic, no cyanosis or edema Neurologic: Alert and oriented X 3, normal strength and tone.  Normal coordination and gait    ECOG PERFORMANCE STATUS: 1 - Symptomatic but completely ambulatory  Blood pressure 118/83, pulse 93, temperature 98 F (36.7 C), temperature source Oral, resp. rate 18, height 5' 9"  (1.753 m), weight 162 lb 6.4 oz (73.7 kg), SpO2 99 %.  LABORATORY DATA: Lab Results  Component Value Date   WBC 12.0 (H) 06/20/2016   HGB 11.7 (L) 06/20/2016   HCT 36.8 (L) 06/20/2016   MCV 94.8 06/20/2016   PLT 321 06/20/2016      Chemistry      Component Value Date/Time   NA 139 06/20/2016 0822   K 5.0 06/20/2016 0822   CL 108 10/25/2014 0845   CO2 16 (L) 06/20/2016 0822     BUN 42.8 (H) 06/20/2016 0822   CREATININE 2.3 (H) 06/20/2016 0822      Component Value Date/Time   CALCIUM 9.4 06/20/2016 0822   ALKPHOS 196 (H) 06/20/2016 0822   AST 20 06/20/2016 0822   ALT 23 06/20/2016 0822   BILITOT 0.23 06/20/2016 0822       RADIOGRAPHIC STUDIES: Ct Abdomen Pelvis Wo Contrast  Result Date: 05/24/2016 CLINICAL DATA:  Lung cancer EXAM: CT CHEST, ABDOMEN AND PELVIS WITHOUT CONTRAST TECHNIQUE: Multidetector CT imaging of the chest, abdomen and pelvis was performed following the standard protocol without IV contrast. COMPARISON:  03/29/2016 FINDINGS: CT CHEST FINDINGS Cardiovascular: Normal heart size. Calcification within the LAD coronary artery noted. No pericardial effusion. Mediastinum/Nodes: The trachea appears patent and is midline. Normal appearance of the esophagus. Right paratracheal lymph node measures 3.7 x 2.7 cm, image 18 of series 2. Previously 3.9 x 2.8 cm.  Similar appearance of partially calcified low right paratracheal and right hilar lymph nodes. No axillary or supraclavicular adenopathy. Lungs/Pleura: No pleural effusions identified. Mild changes of centrilobular emphysema identified. Small bilateral pleural effusions/ thickening noted. Background of mild diffuse ground-glass attenuation and interlobular septal thickening is identified. This is unchanged from the previous study. Similar appearance of calcified granuloma within the left upper lobe, image 56 of series 4. Right lower lobe noncalcified pulmonary nodule measures 5 mm, image 76 of series 4. Previously 4 mm. Musculoskeletal: Extensive multifocal sclerotic bone metastases are identified bilaterally. The appearance is similar to previous exam. CT ABDOMEN PELVIS FINDINGS Hepatobiliary: No focal liver abnormality is seen. No gallstones, gallbladder wall thickening, or biliary dilatation. Pancreas: Calcifications are again identified within the head of pancreas. Low-attenuation structure within the  uncinate process is unchanged measuring 1.3 x 1.8 cm, image 66 of series 2. No inflammatory change identified. Spleen: Normal in size without focal abnormality. Adrenals/Urinary Tract: The adrenal glands are normal. Unremarkable appearance of the kidneys. No mass or hydronephrosis. The urinary bladder is normal. Stomach/Bowel: The stomach is within normal limits. The small bowel loops have a normal course and caliber. No obstruction. Vascular/Lymphatic: Calcified atherosclerotic disease involves the abdominal aorta. No aneurysm. No enlarged retroperitoneal or mesenteric adenopathy. No enlarged pelvic or inguinal lymph nodes. Reproductive: Prostate is unremarkable. Other: No free fluid or fluid collections. Musculoskeletal: Extensive multifocal bone metastases are identified. Advanced osteoarthritis involves both hips. The overall appearance is similar to previous exam. IMPRESSION: 1. No significant interval change compared with previous exam. 2. Calcified and noncalcified mediastinal lymph nodes are similar to previous exam. 3. Stable widespread osseous metastasis. 4. Similar appearance of diffuse ground-glass attenuation and mild interlobular septal thickening throughout the lungs bilaterally. Electronically Signed   By: Kerby Moors M.D.   On: 05/24/2016 09:57   Ct Chest Wo Contrast  Result Date: 05/24/2016 CLINICAL DATA:  Lung cancer EXAM: CT CHEST, ABDOMEN AND PELVIS WITHOUT CONTRAST TECHNIQUE: Multidetector CT imaging of the chest, abdomen and pelvis was performed following the standard protocol without IV contrast. COMPARISON:  03/29/2016 FINDINGS: CT CHEST FINDINGS Cardiovascular: Normal heart size. Calcification within the LAD coronary artery noted. No pericardial effusion. Mediastinum/Nodes: The trachea appears patent and is midline. Normal appearance of the esophagus. Right paratracheal lymph node measures 3.7 x 2.7 cm, image 18 of series 2. Previously 3.9 x 2.8 cm. Similar appearance of partially  calcified low right paratracheal and right hilar lymph nodes. No axillary or supraclavicular adenopathy. Lungs/Pleura: No pleural effusions identified. Mild changes of centrilobular emphysema identified. Small bilateral pleural effusions/ thickening noted. Background of mild diffuse ground-glass attenuation and interlobular septal thickening is identified. This is unchanged from the previous study. Similar appearance of calcified granuloma within the left upper lobe, image 56 of series 4. Right lower lobe noncalcified pulmonary nodule measures 5 mm, image 76 of series 4. Previously 4 mm. Musculoskeletal: Extensive multifocal sclerotic bone metastases are identified bilaterally. The appearance is similar to previous exam. CT ABDOMEN PELVIS FINDINGS Hepatobiliary: No focal liver abnormality is seen. No gallstones, gallbladder wall thickening, or biliary dilatation. Pancreas: Calcifications are again identified within the head of pancreas. Low-attenuation structure within the uncinate process is unchanged measuring 1.3 x 1.8 cm, image 66 of series 2. No inflammatory change identified. Spleen: Normal in size without focal abnormality. Adrenals/Urinary Tract: The adrenal glands are normal. Unremarkable appearance of the kidneys. No mass or hydronephrosis. The urinary bladder is normal. Stomach/Bowel: The stomach is within normal limits. The small bowel loops  have a normal course and caliber. No obstruction. Vascular/Lymphatic: Calcified atherosclerotic disease involves the abdominal aorta. No aneurysm. No enlarged retroperitoneal or mesenteric adenopathy. No enlarged pelvic or inguinal lymph nodes. Reproductive: Prostate is unremarkable. Other: No free fluid or fluid collections. Musculoskeletal: Extensive multifocal bone metastases are identified. Advanced osteoarthritis involves both hips. The overall appearance is similar to previous exam. IMPRESSION: 1. No significant interval change compared with previous exam. 2.  Calcified and noncalcified mediastinal lymph nodes are similar to previous exam. 3. Stable widespread osseous metastasis. 4. Similar appearance of diffuse ground-glass attenuation and mild interlobular septal thickening throughout the lungs bilaterally. Electronically Signed   By: Kerby Moors M.D.   On: 05/24/2016 09:57   ASSESSMENT AND PLAN: This is a very pleasant 55 years old African-American male with:  1)  metastatic non-small cell lung cancer, adenocarcinoma status post induction chemotherapy with carboplatin and Alimta with initial good response followed by disease progression. The patient is currently on second line treatment with immunotherapy with Nivolumab status post 29 cycles. He is tolerating his treatment fairly well with no concerning adverse effects.  I recommended for him to continue his treatment with immunotherapy with cycle #31 today as scheduled.  2) recently diagnosed prostate adenocarcinoma: He is followed at [facility] for this problem. He is currently on hormonal therapy with Lupron and Casodex. He is scheduled for the next injection tomorrow on 06/21/2016.  3) pain management, he would continue on Percocet as needed. He was given a refill of Percocet today.He denies any narcotic induced constipation and states his pain is adequately managed with Percocet every 4 to 6 hours as needed. He usually takes it 2 or 3 times a day.   4) metastatic bone disease, he will be treated with Xgeva after receive dental clearance. He is still reluctant about new treatment. The patient would come back for follow-up visit in 2 weeks for reevaluation before starting cycle #32.   He was advised to call immediately if he has any concerning symptoms in the interval. The patient voices understanding of current disease status and treatment options and is in agreement with the current care plan.  All questions were answered. The patient knows to call the clinic with any problems, questions  or concerns. We can certainly see the patient much sooner if necessary.   Burns Spain, NP-C, AOCNP

## 2016-06-20 NOTE — Patient Instructions (Signed)
[facility] Discharge Instructions for Patients Receiving Chemotherapy  Today you received the following chemotherapy agents:  Nivolumab.  To help prevent nausea and vomiting after your treatment, we encourage you to take your nausea medication as directed.   If you develop nausea and vomiting that is not controlled by your nausea medication, call the clinic.   BELOW ARE SYMPTOMS THAT SHOULD BE REPORTED IMMEDIATELY:  *FEVER GREATER THAN 100.5 F  *CHILLS WITH OR WITHOUT FEVER  NAUSEA AND VOMITING THAT IS NOT CONTROLLED WITH YOUR NAUSEA MEDICATION  *UNUSUAL SHORTNESS OF BREATH  *UNUSUAL BRUISING OR BLEEDING  TENDERNESS IN MOUTH AND THROAT WITH OR WITHOUT PRESENCE OF ULCERS  *URINARY PROBLEMS  *BOWEL PROBLEMS  UNUSUAL RASH Items with * indicate a potential emergency and should be followed up as soon as possible.  Feel free to call the clinic you have any questions or concerns. The clinic phone number is [phone].  Please show the CHEMO ALERT CARD at check-in to the Emergency Department and triage nurse.   

## 2016-06-21 ENCOUNTER — Ambulatory Visit: Payer: Medicare Other | Admitting: Nurse Practitioner

## 2016-06-21 ENCOUNTER — Other Ambulatory Visit: Payer: Medicare Other

## 2016-06-21 ENCOUNTER — Ambulatory Visit: Payer: Medicare Other

## 2016-07-05 ENCOUNTER — Other Ambulatory Visit (HOSPITAL_BASED_OUTPATIENT_CLINIC_OR_DEPARTMENT_OTHER): Payer: Medicare Other

## 2016-07-05 ENCOUNTER — Ambulatory Visit (HOSPITAL_BASED_OUTPATIENT_CLINIC_OR_DEPARTMENT_OTHER): Payer: Medicare Other

## 2016-07-05 ENCOUNTER — Ambulatory Visit (HOSPITAL_BASED_OUTPATIENT_CLINIC_OR_DEPARTMENT_OTHER): Payer: Medicare Other | Admitting: Internal Medicine

## 2016-07-05 ENCOUNTER — Encounter: Payer: Self-pay | Admitting: Internal Medicine

## 2016-07-05 DIAGNOSIS — C61 Malignant neoplasm of prostate: Secondary | ICD-10-CM

## 2016-07-05 DIAGNOSIS — C7889 Secondary malignant neoplasm of other digestive organs: Secondary | ICD-10-CM | POA: Diagnosis not present

## 2016-07-05 DIAGNOSIS — C3401 Malignant neoplasm of right main bronchus: Secondary | ICD-10-CM

## 2016-07-05 DIAGNOSIS — C7951 Secondary malignant neoplasm of bone: Secondary | ICD-10-CM | POA: Diagnosis not present

## 2016-07-05 DIAGNOSIS — C787 Secondary malignant neoplasm of liver and intrahepatic bile duct: Secondary | ICD-10-CM

## 2016-07-05 DIAGNOSIS — C3491 Malignant neoplasm of unspecified part of right bronchus or lung: Secondary | ICD-10-CM

## 2016-07-05 DIAGNOSIS — G893 Neoplasm related pain (acute) (chronic): Secondary | ICD-10-CM

## 2016-07-05 DIAGNOSIS — Z5112 Encounter for antineoplastic immunotherapy: Secondary | ICD-10-CM

## 2016-07-05 DIAGNOSIS — C801 Malignant (primary) neoplasm, unspecified: Principal | ICD-10-CM

## 2016-07-05 LAB — CBC WITH DIFFERENTIAL/PLATELET
BASO%: 0.7 % (ref 0.0–2.0)
BASOS ABS: 0.1 10*3/uL (ref 0.0–0.1)
EOS ABS: 0.5 10*3/uL (ref 0.0–0.5)
EOS%: 4.8 % (ref 0.0–7.0)
HEMATOCRIT: 36.9 % — AB (ref 38.4–49.9)
HGB: 12.1 g/dL — ABNORMAL LOW (ref 13.0–17.1)
LYMPH#: 1.5 10*3/uL (ref 0.9–3.3)
LYMPH%: 14.2 % (ref 14.0–49.0)
MCH: 30.6 pg (ref 27.2–33.4)
MCHC: 32.7 g/dL (ref 32.0–36.0)
MCV: 93.7 fL (ref 79.3–98.0)
MONO#: 0.8 10*3/uL (ref 0.1–0.9)
MONO%: 7.5 % (ref 0.0–14.0)
NEUT#: 7.8 10*3/uL — ABNORMAL HIGH (ref 1.5–6.5)
NEUT%: 72.8 % (ref 39.0–75.0)
PLATELETS: 337 10*3/uL (ref 140–400)
RBC: 3.94 10*6/uL — ABNORMAL LOW (ref 4.20–5.82)
RDW: 15.5 % — ABNORMAL HIGH (ref 11.0–14.6)
WBC: 10.7 10*3/uL — ABNORMAL HIGH (ref 4.0–10.3)

## 2016-07-05 LAB — COMPREHENSIVE METABOLIC PANEL
ALK PHOS: 192 U/L — AB (ref 40–150)
ALT: 21 U/L (ref 0–55)
ANION GAP: 12 meq/L — AB (ref 3–11)
AST: 19 U/L (ref 5–34)
Albumin: 3.1 g/dL — ABNORMAL LOW (ref 3.5–5.0)
BUN: 48.8 mg/dL — ABNORMAL HIGH (ref 7.0–26.0)
CALCIUM: 9.4 mg/dL (ref 8.4–10.4)
CHLORIDE: 112 meq/L — AB (ref 98–109)
CO2: 16 mEq/L — ABNORMAL LOW (ref 22–29)
Creatinine: 2.5 mg/dL — ABNORMAL HIGH (ref 0.7–1.3)
EGFR: 32 mL/min/{1.73_m2} — AB (ref >90)
Glucose: 85 mg/dl (ref 70–140)
POTASSIUM: 5.2 meq/L — AB (ref 3.5–5.1)
Sodium: 140 mEq/L (ref 136–145)
Total Bilirubin: <0.22 mg/dL (ref 0.20–1.20)
Total Protein: 8.1 g/dL (ref 6.4–8.3)

## 2016-07-05 MED ORDER — SODIUM CHLORIDE 0.9 % IJ SOLN
10.0000 mL | INTRAMUSCULAR | Status: DC | PRN
  Administered 2016-07-05: 10 mL
  Filled 2016-07-05: qty 10

## 2016-07-05 MED ORDER — OXYCODONE-ACETAMINOPHEN 5-325 MG PO TABS: 1.0000 | ORAL_TABLET | ORAL | 0 refills | Status: DC | PRN

## 2016-07-05 MED ORDER — NIVOLUMAB CHEMO INJECTION 100 MG/10ML
240.0000 mg | Freq: Once | INTRAVENOUS | Status: AC
  Administered 2016-07-05: 240 mg via INTRAVENOUS
  Filled 2016-07-05: qty 4

## 2016-07-05 MED ORDER — SODIUM CHLORIDE 0.9 % IV SOLN
Freq: Once | INTRAVENOUS | Status: AC
  Administered 2016-07-05: 11:00:00 via INTRAVENOUS

## 2016-07-05 MED ORDER — HEPARIN SOD (PORK) LOCK FLUSH 100 UNIT/ML IV SOLN
500.0000 [IU] | Freq: Once | INTRAVENOUS | Status: AC | PRN
  Administered 2016-07-05: 500 [IU]
  Filled 2016-07-05: qty 5

## 2016-07-05 NOTE — Patient Instructions (Signed)
[facility] Discharge Instructions for Patients Receiving Chemotherapy  Today you received the following chemotherapy agents: Nivolomab  To help prevent nausea and vomiting after your treatment, we encourage you to take your nausea medication as prescribed.  If you develop nausea and vomiting that is not controlled by your nausea medication, call the clinic.   BELOW ARE SYMPTOMS THAT SHOULD BE REPORTED IMMEDIATELY:  *FEVER GREATER THAN 100.5 F  *CHILLS WITH OR WITHOUT FEVER  NAUSEA AND VOMITING THAT IS NOT CONTROLLED WITH YOUR NAUSEA MEDICATION  *UNUSUAL SHORTNESS OF BREATH  *UNUSUAL BRUISING OR BLEEDING  TENDERNESS IN MOUTH AND THROAT WITH OR WITHOUT PRESENCE OF ULCERS  *URINARY PROBLEMS  *BOWEL PROBLEMS  UNUSUAL RASH Items with * indicate a potential emergency and should be followed up as soon as possible.  Feel free to call the clinic you have any questions or concerns. The clinic phone number is [phone].  Please show the [place] at check-in to the Emergency Department and triage nurse.

## 2016-07-05 NOTE — Progress Notes (Signed)
OK to treat per Dr Julien Nordmann with BUN=48.8 and creatinine=2.5

## 2016-07-05 NOTE — Progress Notes (Signed)
[facility] Telephone:(336) [phone]   Fax:(336) [phone]  OFFICE PROGRESS NOTE  [name], [name], MD [place] [address]  DIAGNOSIS:  1) Stage IV (T2b, N2, M1b) non-small cell lung cancer, adenocarcinoma with negative EGFR mutation and negative gene translocation presented with a large right hilar mass as well as mediastinal lymphadenopathy and metastatic disease to the liver, bone and pancreas diagnosed in February 2016. 2) prostate adenocarcinoma diagnosed at [facility] with Gleason score 9 (4+5).  PRIOR THERAPY: Systemic chemotherapy with carboplatin for AUC of 5 and Alimta 500 MG/M2 every 3 weeks. Status post 6 cycles.  CURRENT THERAPY:  1) Immunotherapy with Nivolumab 240 MG every 2 weeks, status post 31 cycles. 2) Lupron and Casodex for the recently diagnosed prostate adenocarcinoma at [place]: Francisco Love 55 y.o. male returns to the clinic today for follow-up visit accompanied by his brother-in-law. The patient is doing fine today with no specific complaints except for few seconds of confusion every now and then. He also has occasional nosebleed. The patient continues to tolerate his current treatment with immunotherapy with Nivolumab fairly well. He is status post 31 cycles. He denied having any significant nausea or vomiting. The patient denied having any fever or chills, no significant weight loss or night sweats. He has no chest pain, shortness of breath but has mild cough with no hemoptysis. He is here today to start cycle #32 of his treatment. He is scheduled to see the oncologist at [facility] for rising PSA.  MEDICAL HISTORY: Past Medical History:  Diagnosis Date  . Chronic fatigue 04/12/2016  . Chronic pain 04/12/2016  . Shortness of breath dyspnea     ALLERGIES:  has No Known Allergies.  MEDICATIONS:  Current Outpatient Prescriptions  Medication Sig Dispense Refill  . acetaminophen  (TYLENOL) 325 MG tablet Take 2 tablets (650 mg total) by mouth every 6 (six) hours as needed for mild pain (or Fever >/= 101).    Marland Kitchen albuterol (PROVENTIL) (2.5 MG/3ML) 0.083% nebulizer solution Take 3 mLs (2.5 mg total) by nebulization every 2 (two) hours as needed for wheezing. 75 mL 12  . AMITIZA 24 MCG capsule     . bicalutamide (CASODEX) 50 MG tablet Take 50 mg by mouth.    . diphenhydrAMINE (BENADRYL) 25 MG tablet Take 25 mg by mouth.    . feeding supplement, ENSURE COMPLETE, (ENSURE COMPLETE) LIQD Take 237 mLs by mouth 3 (three) times daily between meals.    Marland Kitchen FLUOCINOLONE ACETONIDE BODY 0.01 % OIL     . levocetirizine (XYZAL) 5 MG tablet Take 5 mg by mouth.    . lidocaine-prilocaine (EMLA) cream Apply 1 application topically as needed. Apply to port site prior to chemotherapy. 30 g 0  . mirtazapine (REMERON) 15 MG tablet Take 1 tablet (15 mg total) by mouth at bedtime.    Marland Kitchen oxyCODONE-acetaminophen (PERCOCET/ROXICET) 5-325 MG tablet Take 1 tablet by mouth every 4 (four) hours as needed for severe pain. 60 tablet 0  . polyethylene glycol (MIRALAX / GLYCOLAX) packet Take 17 g by mouth daily.    . prochlorperazine (COMPAZINE) 10 MG tablet Take 1 tablet (10 mg total) by mouth every 6 (six) hours as needed for nausea or vomiting. 30 tablet 1  . senna-docusate (SENOKOT-S) 8.6-50 MG per tablet Take 1 tablet by mouth daily.    . SUMAtriptan (IMITREX) 25 MG tablet     . tamsulosin (FLOMAX) 0.4  MG CAPS capsule     . triamcinolone ointment (KENALOG) 0.1 % Apply to arms twice a day until clear, then stop.    . valACYclovir (VALTREX) 1000 MG tablet      No current facility-administered medications for this visit.     SURGICAL HISTORY:  Past Surgical History:  Procedure Laterality Date  . APPENDECTOMY    . VIDEO BRONCHOSCOPY N/A 09/21/2014   Procedure: VIDEO BRONCHOSCOPY WITH FLUORO;  Surgeon: Rigoberto Noel, MD;  Location: [facility];  Service: Cardiopulmonary;  Laterality: N/A;  . VIDEO  BRONCHOSCOPY WITH ENDOBRONCHIAL ULTRASOUND N/A 09/23/2014   Procedure: VIDEO BRONCHOSCOPY WITH ENDOBRONCHIAL ULTRASOUND;  Surgeon: Rigoberto Noel, MD;  Location: [place];  Service: Pulmonary;  Laterality: N/A;    REVIEW OF SYSTEMS:  A comprehensive review of systems was negative except for: Constitutional: positive for fatigue Ears, nose, mouth, throat, and face: positive for epistaxis   PHYSICAL EXAMINATION: General appearance: alert, cooperative, fatigued and no distress Head: Normocephalic, without obvious abnormality, atraumatic Neck: no adenopathy, no JVD, supple, symmetrical, trachea midline and thyroid not enlarged, symmetric, no tenderness/mass/nodules Lymph nodes: Cervical, supraclavicular, and axillary nodes normal. Resp: clear to auscultation bilaterally Back: symmetric, no curvature. ROM normal. No CVA tenderness. Cardio: regular rate and rhythm, S1, S2 normal, no murmur, click, rub or gallop GI: soft, non-tender; bowel sounds normal; no masses,  no organomegaly Extremities: extremities normal, atraumatic, no cyanosis or edema Neurologic: Alert and oriented X 3, normal strength and tone. Normal symmetric reflexes. Normal coordination and gait  ECOG PERFORMANCE STATUS: 1 - Symptomatic but completely ambulatory  Blood pressure 130/81, pulse (!) 107, temperature 97.6 F (36.4 C), temperature source Oral, resp. rate 18, height 5' 9"  (1.753 m), weight 163 lb 9.6 oz (74.2 kg), SpO2 96 %.  LABORATORY DATA: Lab Results  Component Value Date   WBC 10.7 (H) 07/05/2016   HGB 12.1 (L) 07/05/2016   HCT 36.9 (L) 07/05/2016   MCV 93.7 07/05/2016   PLT 337 07/05/2016      Chemistry      Component Value Date/Time   NA 140 07/05/2016 0754   K 5.2 (H) 07/05/2016 0754   CL 108 10/25/2014 0845   CO2 16 (L) 07/05/2016 0754   BUN 48.8 (H) 07/05/2016 0754   CREATININE 2.5 (H) 07/05/2016 0754      Component Value Date/Time   CALCIUM 9.4 07/05/2016 0754   ALKPHOS 192 (H) 07/05/2016 0754    AST 19 07/05/2016 0754   ALT 21 07/05/2016 0754   BILITOT <0.22 07/05/2016 0754       RADIOGRAPHIC STUDIES: No results found. ASSESSMENT AND PLAN: This is a very pleasant 55 years old African-American male with:  1)  metastatic non-small cell lung cancer, adenocarcinoma status post induction chemotherapy with carboplatin and Alimta with initial good response followed by disease progression. The patient is currently on second line treatment with immunotherapy with Nivolumab status post 31 cycles. He is tolerating his treatment fairly well with no concerning adverse effects.  I recommended for him to continue his treatment with immunotherapy with cycle #32 today as scheduled.  2) recently diagnosed prostate adenocarcinoma: He is followed at [facility] for this problem. He is currently on hormonal therapy with Lupron and Casodex. He is scheduled to see Dr. Jacelyn Grip in the next few weeks.  3) pain management, he would continue on Percocet as needed. He was given a refill of Percocet today.  4) metastatic bone disease, he will be treated with Delton See after receive  dental clearance. He is still reluctant about new treatment. The patient would come back for follow-up visit in 2 weeks for reevaluation before starting cycle #33. For the epistaxis, he was advised to use saline nasal drops as needed. He was advised to call immediately if he has any concerning symptoms in the interval. The patient voices understanding of current disease status and treatment options and is in agreement with the current care plan.  All questions were answered. The patient knows to call the clinic with any problems, questions or concerns. We can certainly see the patient much sooner if necessary.  Disclaimer: This note was dictated with voice recognition software. Similar sounding words can inadvertently be transcribed and may not be corrected upon review.

## 2016-07-13 ENCOUNTER — Telehealth: Payer: Self-pay | Admitting: Internal Medicine

## 2016-07-13 NOTE — Telephone Encounter (Signed)
Added appointments for December and January. Left message and patient to get new schedule at next visit 11/30.

## 2016-07-19 ENCOUNTER — Other Ambulatory Visit (HOSPITAL_BASED_OUTPATIENT_CLINIC_OR_DEPARTMENT_OTHER): Payer: Medicare Other

## 2016-07-19 ENCOUNTER — Ambulatory Visit (HOSPITAL_BASED_OUTPATIENT_CLINIC_OR_DEPARTMENT_OTHER): Payer: Medicare Other | Admitting: Internal Medicine

## 2016-07-19 ENCOUNTER — Ambulatory Visit (HOSPITAL_BASED_OUTPATIENT_CLINIC_OR_DEPARTMENT_OTHER): Payer: Medicare Other

## 2016-07-19 ENCOUNTER — Other Ambulatory Visit: Payer: Self-pay | Admitting: Medical Oncology

## 2016-07-19 ENCOUNTER — Telehealth: Payer: Self-pay | Admitting: Internal Medicine

## 2016-07-19 ENCOUNTER — Encounter: Payer: Self-pay | Admitting: Internal Medicine

## 2016-07-19 VITALS — BP 131/82 | HR 111 | Temp 97.9°F | Resp 17 | Ht 69.0 in | Wt 164.9 lb

## 2016-07-19 DIAGNOSIS — C61 Malignant neoplasm of prostate: Secondary | ICD-10-CM

## 2016-07-19 DIAGNOSIS — C3401 Malignant neoplasm of right main bronchus: Secondary | ICD-10-CM

## 2016-07-19 DIAGNOSIS — C7889 Secondary malignant neoplasm of other digestive organs: Secondary | ICD-10-CM | POA: Diagnosis not present

## 2016-07-19 DIAGNOSIS — R0602 Shortness of breath: Secondary | ICD-10-CM

## 2016-07-19 DIAGNOSIS — Z5112 Encounter for antineoplastic immunotherapy: Secondary | ICD-10-CM

## 2016-07-19 DIAGNOSIS — C787 Secondary malignant neoplasm of liver and intrahepatic bile duct: Secondary | ICD-10-CM | POA: Diagnosis not present

## 2016-07-19 DIAGNOSIS — C3491 Malignant neoplasm of unspecified part of right bronchus or lung: Secondary | ICD-10-CM

## 2016-07-19 DIAGNOSIS — G893 Neoplasm related pain (acute) (chronic): Secondary | ICD-10-CM

## 2016-07-19 DIAGNOSIS — C7951 Secondary malignant neoplasm of bone: Secondary | ICD-10-CM

## 2016-07-19 DIAGNOSIS — C801 Malignant (primary) neoplasm, unspecified: Secondary | ICD-10-CM

## 2016-07-19 DIAGNOSIS — Z79899 Other long term (current) drug therapy: Secondary | ICD-10-CM | POA: Diagnosis not present

## 2016-07-19 DIAGNOSIS — R5382 Chronic fatigue, unspecified: Secondary | ICD-10-CM

## 2016-07-19 LAB — COMPREHENSIVE METABOLIC PANEL
ALBUMIN: 3 g/dL — AB (ref 3.5–5.0)
ALK PHOS: 284 U/L — AB (ref 40–150)
ALT: 26 U/L (ref 0–55)
ANION GAP: 10 meq/L (ref 3–11)
AST: 22 U/L (ref 5–34)
BUN: 38.7 mg/dL — ABNORMAL HIGH (ref 7.0–26.0)
CALCIUM: 9.2 mg/dL (ref 8.4–10.4)
CO2: 13 mEq/L — ABNORMAL LOW (ref 22–29)
Chloride: 116 mEq/L — ABNORMAL HIGH (ref 98–109)
Creatinine: 2.5 mg/dL — ABNORMAL HIGH (ref 0.7–1.3)
EGFR: 32 mL/min/{1.73_m2} — AB (ref >90)
GLUCOSE: 93 mg/dL (ref 70–140)
Potassium: 5.1 mEq/L (ref 3.5–5.1)
Sodium: 138 mEq/L (ref 136–145)
TOTAL PROTEIN: 8.3 g/dL (ref 6.4–8.3)

## 2016-07-19 LAB — CBC WITH DIFFERENTIAL/PLATELET
BASO%: 0.5 % (ref 0.0–2.0)
BASOS ABS: 0.1 10*3/uL (ref 0.0–0.1)
EOS ABS: 0.7 10*3/uL — AB (ref 0.0–0.5)
EOS%: 4.4 % (ref 0.0–7.0)
HEMATOCRIT: 35.9 % — AB (ref 38.4–49.9)
HEMOGLOBIN: 11.9 g/dL — AB (ref 13.0–17.1)
LYMPH#: 2.6 10*3/uL (ref 0.9–3.3)
LYMPH%: 17.5 % (ref 14.0–49.0)
MCH: 30 pg (ref 27.2–33.4)
MCHC: 33.1 g/dL (ref 32.0–36.0)
MCV: 90.4 fL (ref 79.3–98.0)
MONO#: 1.3 10*3/uL — AB (ref 0.1–0.9)
MONO%: 8.8 % (ref 0.0–14.0)
NEUT#: 10.3 10*3/uL — ABNORMAL HIGH (ref 1.5–6.5)
NEUT%: 68.8 % (ref 39.0–75.0)
PLATELETS: 371 10*3/uL (ref 140–400)
RBC: 3.97 10*6/uL — ABNORMAL LOW (ref 4.20–5.82)
RDW: 15.5 % — AB (ref 11.0–14.6)
WBC: 15 10*3/uL — ABNORMAL HIGH (ref 4.0–10.3)

## 2016-07-19 LAB — TSH: TSH: 3.222 m[IU]/L (ref 0.320–4.118)

## 2016-07-19 LAB — TECHNOLOGIST REVIEW

## 2016-07-19 MED ORDER — SODIUM CHLORIDE 0.9 % IV SOLN
Freq: Once | INTRAVENOUS | Status: AC
  Administered 2016-07-19: 10:00:00 via INTRAVENOUS

## 2016-07-19 MED ORDER — HEPARIN SOD (PORK) LOCK FLUSH 100 UNIT/ML IV SOLN
500.0000 [IU] | Freq: Once | INTRAVENOUS | Status: AC | PRN
  Administered 2016-07-19: 500 [IU]
  Filled 2016-07-19: qty 5

## 2016-07-19 MED ORDER — OXYCODONE-ACETAMINOPHEN 5-325 MG PO TABS: 1.0000 | ORAL_TABLET | ORAL | 0 refills | Status: DC | PRN

## 2016-07-19 MED ORDER — SODIUM CHLORIDE 0.9 % IJ SOLN
10.0000 mL | INTRAMUSCULAR | Status: DC | PRN
  Administered 2016-07-19: 10 mL
  Filled 2016-07-19: qty 10

## 2016-07-19 MED ORDER — SODIUM CHLORIDE 0.9 % IV SOLN
240.0000 mg | Freq: Once | INTRAVENOUS | Status: AC
  Administered 2016-07-19: 240 mg via INTRAVENOUS
  Filled 2016-07-19: qty 10

## 2016-07-19 NOTE — Telephone Encounter (Signed)
Patient currently on schedule for q2w lab/fu/tx thru end of January. Gave patient relative avs report and appointments for December and January. Central radiology will call re scan.

## 2016-07-19 NOTE — Progress Notes (Signed)
[place] [phone]   Fax:([phone]  OFFICE PROGRESS NOTE  [name], [name], MD [place] [address]  DIAGNOSIS:  1) Stage IV (T2b, N2, M1b) non-small cell lung cancer, adenocarcinoma with negative EGFR mutation and negative gene translocation presented with a large right hilar mass as well as mediastinal lymphadenopathy and metastatic disease to the liver, bone and pancreas diagnosed in February 2016. 2) prostate adenocarcinoma diagnosed at [facility] with Gleason score 9 (4+5).  PRIOR THERAPY: Systemic chemotherapy with carboplatin for AUC of 5 and Alimta 500 MG/M2 every 3 weeks. Status post 6 cycles.  CURRENT THERAPY:  1) Immunotherapy with Nivolumab 240 MG every 2 weeks, status post 32 cycles. 2) Lupron and Casodex for the recently diagnosed prostate adenocarcinoma at [place]: Francisco Love 55 y.o. male returns to the clinic today for follow-up visit accompanied by his brother-in-law. The patient is doing fine today with no specific complaints except for shortness of breath at baseline and increased with exertion and mild itching in the hand and groin area. The patient continues to tolerate his current treatment with immunotherapy with Nivolumab fairly well. He is status post 32 cycles. He denied having any significant nausea or vomiting. The patient denied having any fever or chills, no significant weight loss or night sweats. He has no chest pain, shortness of breath but has mild cough with no hemoptysis. He is here today to start cycle #33 of his treatment. He is scheduled to see the oncologist at [facility] later today for rising PSA. Oxygen saturation today with room air at rest was 95%, ambulating with room air was 84% and on 2 L of oxygen while ambulating was up to 97%.  MEDICAL HISTORY: Past Medical History:  Diagnosis Date  . Chronic fatigue 04/12/2016  . Chronic pain 04/12/2016    . Shortness of breath dyspnea     ALLERGIES:  has No Known Allergies.  MEDICATIONS:  Current Outpatient Prescriptions  Medication Sig Dispense Refill  . acetaminophen (TYLENOL) 325 MG tablet Take 2 tablets (650 mg total) by mouth every 6 (six) hours as needed for mild pain (or Fever >/= 101).    Marland Kitchen albuterol (PROVENTIL) (2.5 MG/3ML) 0.083% nebulizer solution Take 3 mLs (2.5 mg total) by nebulization every 2 (two) hours as needed for wheezing. 75 mL 12  . AMITIZA 24 MCG capsule     . bicalutamide (CASODEX) 50 MG tablet Take 50 mg by mouth.    . diphenhydrAMINE (BENADRYL) 25 MG tablet Take 25 mg by mouth.    . feeding supplement, ENSURE COMPLETE, (ENSURE COMPLETE) LIQD Take 237 mLs by mouth 3 (three) times daily between meals.    Marland Kitchen FLUOCINOLONE ACETONIDE BODY 0.01 % OIL     . levocetirizine (XYZAL) 5 MG tablet Take 5 mg by mouth.    . lidocaine-prilocaine (EMLA) cream Apply 1 application topically as needed. Apply to port site prior to chemotherapy. 30 g 0  . mirtazapine (REMERON) 15 MG tablet Take 1 tablet (15 mg total) by mouth at bedtime.    Marland Kitchen oxyCODONE-acetaminophen (PERCOCET/ROXICET) 5-325 MG tablet Take 1 tablet by mouth every 4 (four) hours as needed for severe pain. 60 tablet 0  . polyethylene glycol (MIRALAX / GLYCOLAX) packet Take 17 g by mouth daily.    . prochlorperazine (COMPAZINE) 10 MG tablet Take 1 tablet (10 mg total) by mouth every 6 (six) hours as needed for nausea  or vomiting. 30 tablet 1  . senna-docusate (SENOKOT-S) 8.6-50 MG per tablet Take 1 tablet by mouth daily.    . SUMAtriptan (IMITREX) 25 MG tablet     . tamsulosin (FLOMAX) 0.4 MG CAPS capsule     . triamcinolone ointment (KENALOG) 0.1 % Apply to arms twice a day until clear, then stop.     No current facility-administered medications for this visit.     SURGICAL HISTORY:  Past Surgical History:  Procedure Laterality Date  . APPENDECTOMY    . VIDEO BRONCHOSCOPY N/A 09/21/2014   Procedure: VIDEO BRONCHOSCOPY  WITH FLUORO;  Surgeon: Rakesh Alva V, MD;  Location: [facility];  Service: Cardiopulmonary;  Laterality: N/A;  . VIDEO BRONCHOSCOPY WITH ENDOBRONCHIAL ULTRASOUND N/A 09/23/2014   Procedure: VIDEO BRONCHOSCOPY WITH ENDOBRONCHIAL ULTRASOUND;  Surgeon: Rakesh Alva V, MD;  Location: [facility] OR;  Service: Pulmonary;  Laterality: N/A;    REVIEW OF SYSTEMS:  A comprehensive review of systems was negative except for: Constitutional: positive for fatigue Respiratory: positive for dyspnea on exertion   PHYSICAL EXAMINATION: General appearance: alert, cooperative, fatigued and no distress Head: Normocephalic, without obvious abnormality, atraumatic Neck: no adenopathy, no JVD, supple, symmetrical, trachea midline and thyroid not enlarged, symmetric, no tenderness/mass/nodules Lymph nodes: Cervical, supraclavicular, and axillary nodes normal. Resp: clear to auscultation bilaterally Back: symmetric, no curvature. ROM normal. No CVA tenderness. Cardio: regular rate and rhythm, S1, S2 normal, no murmur, click, rub or gallop GI: soft, non-tender; bowel sounds normal; no masses,  no organomegaly Extremities: extremities normal, atraumatic, no cyanosis or edema Neurologic: Alert and oriented X 3, normal strength and tone. Normal symmetric reflexes. Normal coordination and gait  ECOG PERFORMANCE STATUS: 1 - Symptomatic but completely ambulatory  Blood pressure 131/82, pulse (!) 111, temperature 97.9 F (36.6 C), temperature source Oral, resp. rate 17, height 5' 9" (1.753 m), weight 164 lb 14.4 oz (74.8 kg), SpO2 97 %.  LABORATORY DATA: Lab Results  Component Value Date   WBC 15.0 (H) 07/19/2016   HGB 11.9 (L) 07/19/2016   HCT 35.9 (L) 07/19/2016   MCV 90.4 07/19/2016   PLT 371 07/19/2016      Chemistry      Component Value Date/Time   NA 138 07/19/2016 0808   K 5.1 07/19/2016 0808   CL 108 10/25/2014 0845   CO2 13 (L) 07/19/2016 0808   BUN 38.7 (H) 07/19/2016 0808   CREATININE 2.5 (H) 07/19/2016  0808      Component Value Date/Time   CALCIUM 9.2 07/19/2016 0808   ALKPHOS 284 (H) 07/19/2016 0808   AST 22 07/19/2016 0808   ALT 26 07/19/2016 0808   BILITOT <0.22 07/19/2016 0808       RADIOGRAPHIC STUDIES: No results found. ASSESSMENT AND PLAN: This is a very pleasant 55 years old African-American male with:  1)  metastatic non-small cell lung cancer, adenocarcinoma status post induction chemotherapy with carboplatin and Alimta with initial good response followed by disease progression. The patient is currently on second line treatment with immunotherapy with Nivolumab status post 32 cycles. He is tolerating his treatment fairly well with no concerning adverse effects except for mild itching.  I recommended for him to continue his treatment with immunotherapy with cycle #33 today as scheduled.  2) recently diagnosed prostate adenocarcinoma: He is followed at [facility] for this problem. He is currently on hormonal therapy with Lupron and Casodex. He is scheduled to see Dr. Wong in the next few weeks.  3) pain management, he would   continue on Percocet as needed. He was given a refill of Percocet today.  4) metastatic bone disease, he will be treated with Xgeva after receive dental clearance. He is still reluctant about new treatment. The patient would come back for follow-up visit in 2 weeks for reevaluation before starting cycle #34 after repeating CT scan of the chest, abdomen and pelvis without contrast. He was advised to call immediately if he has any concerning symptoms in the interval. The patient voices understanding of current disease status and treatment options and is in agreement with the current care plan.  All questions were answered. The patient knows to call the clinic with any problems, questions or concerns. We can certainly see the patient much sooner if necessary.  Disclaimer: This note was dictated with voice recognition software. Similar sounding words can  inadvertently be transcribed and may not be corrected upon review.       

## 2016-07-19 NOTE — Progress Notes (Signed)
Faxed o2 sat data and progress note to [employer] at [phone].

## 2016-07-19 NOTE — Patient Instructions (Signed)
[facility] Discharge Instructions for Patients Receiving Chemotherapy  Today you received the following chemotherapy agents Nivolumab.  To help prevent nausea and vomiting after your treatment, we encourage you to take your nausea medication as prescribed.   If you develop nausea and vomiting that is not controlled by your nausea medication, call the clinic.   BELOW ARE SYMPTOMS THAT SHOULD BE REPORTED IMMEDIATELY:  *FEVER GREATER THAN 100.5 F  *CHILLS WITH OR WITHOUT FEVER  NAUSEA AND VOMITING THAT IS NOT CONTROLLED WITH YOUR NAUSEA MEDICATION  *UNUSUAL SHORTNESS OF BREATH  *UNUSUAL BRUISING OR BLEEDING  TENDERNESS IN MOUTH AND THROAT WITH OR WITHOUT PRESENCE OF ULCERS  *URINARY PROBLEMS  *BOWEL PROBLEMS  UNUSUAL RASH Items with * indicate a potential emergency and should be followed up as soon as possible.  Feel free to call the clinic you have any questions or concerns. The clinic phone number is [phone].  Please show the CHEMO ALERT CARD at check-in to the Emergency Department and triage nurse.   

## 2016-08-02 ENCOUNTER — Ambulatory Visit (HOSPITAL_BASED_OUTPATIENT_CLINIC_OR_DEPARTMENT_OTHER): Payer: Medicare Other | Admitting: Nurse Practitioner

## 2016-08-02 ENCOUNTER — Encounter (HOSPITAL_COMMUNITY): Payer: Self-pay

## 2016-08-02 ENCOUNTER — Ambulatory Visit (HOSPITAL_COMMUNITY)
Admission: RE | Admit: 2016-08-02 | Discharge: 2016-08-02 | Disposition: A | Discharge status: Home / Self Care | Payer: Medicare Other | Source: Ambulatory Visit | Attending: Internal Medicine | Admitting: Internal Medicine

## 2016-08-02 ENCOUNTER — Other Ambulatory Visit (HOSPITAL_BASED_OUTPATIENT_CLINIC_OR_DEPARTMENT_OTHER): Payer: Medicare Other

## 2016-08-02 ENCOUNTER — Ambulatory Visit (HOSPITAL_BASED_OUTPATIENT_CLINIC_OR_DEPARTMENT_OTHER): Payer: Medicare Other

## 2016-08-02 VITALS — HR 96

## 2016-08-02 VITALS — BP 141/98 | HR 108 | Temp 97.7°F | Resp 18 | Wt 164.4 lb

## 2016-08-02 DIAGNOSIS — C3491 Malignant neoplasm of unspecified part of right bronchus or lung: Secondary | ICD-10-CM

## 2016-08-02 DIAGNOSIS — Z5112 Encounter for antineoplastic immunotherapy: Secondary | ICD-10-CM

## 2016-08-02 DIAGNOSIS — C801 Malignant (primary) neoplasm, unspecified: Secondary | ICD-10-CM | POA: Diagnosis not present

## 2016-08-02 DIAGNOSIS — C7889 Secondary malignant neoplasm of other digestive organs: Secondary | ICD-10-CM

## 2016-08-02 DIAGNOSIS — C787 Secondary malignant neoplasm of liver and intrahepatic bile duct: Secondary | ICD-10-CM

## 2016-08-02 DIAGNOSIS — C61 Malignant neoplasm of prostate: Secondary | ICD-10-CM

## 2016-08-02 DIAGNOSIS — R0602 Shortness of breath: Secondary | ICD-10-CM | POA: Insufficient documentation

## 2016-08-02 DIAGNOSIS — C3401 Malignant neoplasm of right main bronchus: Secondary | ICD-10-CM | POA: Diagnosis not present

## 2016-08-02 DIAGNOSIS — C7951 Secondary malignant neoplasm of bone: Secondary | ICD-10-CM | POA: Diagnosis not present

## 2016-08-02 DIAGNOSIS — G893 Neoplasm related pain (acute) (chronic): Secondary | ICD-10-CM

## 2016-08-02 LAB — CBC WITH DIFFERENTIAL/PLATELET
BASO%: 1.1 % (ref 0.0–2.0)
BASOS ABS: 0.1 10*3/uL (ref 0.0–0.1)
EOS ABS: 0.6 10*3/uL — AB (ref 0.0–0.5)
EOS%: 4.5 % (ref 0.0–7.0)
HCT: 36.8 % — ABNORMAL LOW (ref 38.4–49.9)
HEMOGLOBIN: 11.7 g/dL — AB (ref 13.0–17.1)
LYMPH%: 18.4 % (ref 14.0–49.0)
MCH: 29.5 pg (ref 27.2–33.4)
MCHC: 31.9 g/dL — ABNORMAL LOW (ref 32.0–36.0)
MCV: 92.8 fL (ref 79.3–98.0)
MONO#: 1.1 10*3/uL — AB (ref 0.1–0.9)
MONO%: 9 % (ref 0.0–14.0)
NEUT%: 67 % (ref 39.0–75.0)
NEUTROS ABS: 8.5 10*3/uL — AB (ref 1.5–6.5)
PLATELETS: 314 10*3/uL (ref 140–400)
RBC: 3.97 10*6/uL — ABNORMAL LOW (ref 4.20–5.82)
RDW: 16.9 % — AB (ref 11.0–14.6)
WBC: 12.7 10*3/uL — AB (ref 4.0–10.3)
lymph#: 2.3 10*3/uL (ref 0.9–3.3)

## 2016-08-02 LAB — COMPREHENSIVE METABOLIC PANEL
ALBUMIN: 3.1 g/dL — AB (ref 3.5–5.0)
ALK PHOS: 236 U/L — AB (ref 40–150)
ALT: 17 U/L (ref 0–55)
ANION GAP: 11 meq/L (ref 3–11)
AST: 15 U/L (ref 5–34)
BILIRUBIN TOTAL: 0.22 mg/dL (ref 0.20–1.20)
BUN: 46.4 mg/dL — ABNORMAL HIGH (ref 7.0–26.0)
CALCIUM: 9.2 mg/dL (ref 8.4–10.4)
CO2: 14 mEq/L — ABNORMAL LOW (ref 22–29)
Chloride: 113 mEq/L — ABNORMAL HIGH (ref 98–109)
Creatinine: 2.4 mg/dL — ABNORMAL HIGH (ref 0.7–1.3)
EGFR: 33 mL/min/{1.73_m2} — AB (ref >90)
Glucose: 81 mg/dl (ref 70–140)
POTASSIUM: 5.3 meq/L — AB (ref 3.5–5.1)
SODIUM: 138 meq/L (ref 136–145)
TOTAL PROTEIN: 8.1 g/dL (ref 6.4–8.3)

## 2016-08-02 MED ORDER — SODIUM CHLORIDE 0.9 % IV SOLN
240.0000 mg | Freq: Once | INTRAVENOUS | Status: AC
  Administered 2016-08-02: 240 mg via INTRAVENOUS
  Filled 2016-08-02: qty 20

## 2016-08-02 MED ORDER — HEPARIN SOD (PORK) LOCK FLUSH 100 UNIT/ML IV SOLN
500.0000 [IU] | Freq: Once | INTRAVENOUS | Status: AC | PRN
  Administered 2016-08-02: 500 [IU]
  Filled 2016-08-02: qty 5

## 2016-08-02 MED ORDER — OXYCODONE-ACETAMINOPHEN 5-325 MG PO TABS: 1.0000 | ORAL_TABLET | ORAL | 0 refills | Status: DC | PRN

## 2016-08-02 MED ORDER — SODIUM CHLORIDE 0.9 % IJ SOLN
10.0000 mL | INTRAMUSCULAR | Status: DC | PRN
  Administered 2016-08-02: 10 mL
  Filled 2016-08-02: qty 10

## 2016-08-02 MED ORDER — SODIUM CHLORIDE 0.9 % IV SOLN
Freq: Once | INTRAVENOUS | Status: AC
  Administered 2016-08-02: 14:00:00 via INTRAVENOUS

## 2016-08-02 NOTE — Progress Notes (Addendum)
[facility] OFFICE PROGRESS NOTE    DIAGNOSIS:  1) Stage IV (T2b, N2, M1b) non-small cell lung cancer, adenocarcinoma with negative EGFR mutation and negative gene translocation presented with a large right hilar mass as well as mediastinal lymphadenopathy and metastatic disease to the liver, bone and pancreas diagnosed in February 2016. 2) prostate adenocarcinoma diagnosed at [facility] with Gleason score 9 (4+5).  PRIOR THERAPY: Systemic chemotherapy with carboplatin for AUC of 5 and Alimta 500 MG/M2 every 3 weeks. Status post 6 cycles.  CURRENT THERAPY:  1) Immunotherapy with Nivolumab 240 MG every 2 weeks, status post 33 cycles. 2) Enzalutamide prostate adenocarcinoma at [place]:   Francisco Love returns as scheduled. He completed cycle 33 nivolumab 07/19/2016. He denies nausea/vomiting. No mouth sores. No diarrhea. No skin rash. He notes stress skin on his hands. He has stable dyspnea on exertion. No cough. No fever. Stable pain in both legs. He reports a good appetite.  He was recently started on enzalutamide due to a rising PSA.  Objective:  Vital signs in last 24 hours:  Blood pressure (!) 141/98, pulse (!) 108, temperature 97.7 F (36.5 C), temperature source Oral, resp. rate 18, weight 164 lb 6.4 oz (74.6 kg), SpO2 95 %.    HEENT: No thrush or ulcers. Resp: Lungs clear bilaterally. Cardio: Regular rate and rhythm. GI: Abdomen soft and nontender. No hepatomegaly. Vascular: No leg edema. Skin: No rash. Port-A-Cath without erythema.    Lab Results:  Lab Results  Component Value Date   WBC 12.7 (H) 08/02/2016   HGB 11.7 (L) 08/02/2016   HCT 36.8 (L) 08/02/2016   MCV 92.8 08/02/2016   PLT 314 08/02/2016   NEUTROABS 8.5 (H) 08/02/2016    Imaging:  Ct Abdomen Pelvis Wo Contrast  Result Date: 08/02/2016 CLINICAL DATA:  Followup lung cancer EXAM: CT CHEST, ABDOMEN AND PELVIS WITHOUT CONTRAST TECHNIQUE: Multidetector  CT imaging of the chest, abdomen and pelvis was performed following the standard protocol without IV contrast. COMPARISON:  05/24/2016 FINDINGS: CT CHEST FINDINGS Cardiovascular: The heart size appears normal. No pericardial effusion identified. Aortic atherosclerosis is identified. Calcification in the LAD coronary artery is identified. Mediastinum/Nodes: The trachea appears patent and is midline. Normal appearance of the esophagus. The index partially calcified right paratracheal lymph node measure 2.4 cm, image 20 of series 2. Previously 2.7 cm. Partially calcified right paratracheal lymph node measures 1.6 cm, image 24 of series 2. Unchanged from previous exam. Lungs/Pleura: Mild pleural thickening overlying the posterior right lung. Small left pleural effusion is again noted and appears unchanged. Moderate changes of centrilobular and paraseptal emphysema identified. Background of mild diffuse ground-glass attenuation and interlobular septal thickening is again noted. Noncalcified right lower lobe lung nodule is identified measuring 6 mm, image number 80 of series 4. Unchanged from previous exam. Calcified granuloma within the left upper lobe is stable. No new or enlarging pulmonary nodules or masses identified. Musculoskeletal: Multifocal areas of sclerotic bone metastases identified. The pattern of disease is similar to the previous exam. CT ABDOMEN PELVIS FINDINGS Hepatobiliary: Within the limitations of unenhanced technique there is no suspicious liver abnormality identified. The gallbladder appears normal. No biliary dilatation. Calcifications are again noted within head of pancreas. The low-attenuation structure within the head of pancreas is unchanged measuring 1.4 cm, image 68 of series 2. Pancreas: Normal appearance of the pancreas. Spleen: The spleen is unremarkable. Adrenals/Urinary Tract: Normal appearance of the adrenal glands. The right kidney appears normal.  There is mild atrophy of the left  kidney. No mass or hydronephrosis. The urinary bladder appears normal. Stomach/Bowel: Stomach is within normal limits. Appendix appears normal. No evidence of bowel wall thickening, distention, or inflammatory changes. Vascular/Lymphatic: Aortic atherosclerosis. No enlarged abdominal or pelvic adenopathy. Reproductive: The prostate gland and seminal vesicles are unremarkable. Other: There is no ascites or focal fluid collections within the abdomen or pelvis. There is a fat containing left inguinal hernia noted. Musculoskeletal: Severe osteoarthritis involving both hips is noted multifocal sclerotic bone metastases involving the lumbar spine and pelvis appears similar to the previous exam. IMPRESSION: 1. No significant interval change compared with previous exam. 2. Stable appearance of calcified pole and noncalcified mediastinal and hilar lymph nodes. 3. Stable widespread osseous metastases. 4. Similar appearance of diffuse ground-glass attenuation and mild interlobular septal thickening identified throughout both lungs bilaterally. Electronically Signed   By: Kerby Moors M.D.   On: 08/02/2016 11:14   Ct Chest Wo Contrast  Result Date: 08/02/2016 CLINICAL DATA:  Followup lung cancer EXAM: CT CHEST, ABDOMEN AND PELVIS WITHOUT CONTRAST TECHNIQUE: Multidetector CT imaging of the chest, abdomen and pelvis was performed following the standard protocol without IV contrast. COMPARISON:  05/24/2016 FINDINGS: CT CHEST FINDINGS Cardiovascular: The heart size appears normal. No pericardial effusion identified. Aortic atherosclerosis is identified. Calcification in the LAD coronary artery is identified. Mediastinum/Nodes: The trachea appears patent and is midline. Normal appearance of the esophagus. The index partially calcified right paratracheal lymph node measure 2.4 cm, image 20 of series 2. Previously 2.7 cm. Partially calcified right paratracheal lymph node measures 1.6 cm, image 24 of series 2. Unchanged from  previous exam. Lungs/Pleura: Mild pleural thickening overlying the posterior right lung. Small left pleural effusion is again noted and appears unchanged. Moderate changes of centrilobular and paraseptal emphysema identified. Background of mild diffuse ground-glass attenuation and interlobular septal thickening is again noted. Noncalcified right lower lobe lung nodule is identified measuring 6 mm, image number 80 of series 4. Unchanged from previous exam. Calcified granuloma within the left upper lobe is stable. No new or enlarging pulmonary nodules or masses identified. Musculoskeletal: Multifocal areas of sclerotic bone metastases identified. The pattern of disease is similar to the previous exam. CT ABDOMEN PELVIS FINDINGS Hepatobiliary: Within the limitations of unenhanced technique there is no suspicious liver abnormality identified. The gallbladder appears normal. No biliary dilatation. Calcifications are again noted within head of pancreas. The low-attenuation structure within the head of pancreas is unchanged measuring 1.4 cm, image 68 of series 2. Pancreas: Normal appearance of the pancreas. Spleen: The spleen is unremarkable. Adrenals/Urinary Tract: Normal appearance of the adrenal glands. The right kidney appears normal. There is mild atrophy of the left kidney. No mass or hydronephrosis. The urinary bladder appears normal. Stomach/Bowel: Stomach is within normal limits. Appendix appears normal. No evidence of bowel wall thickening, distention, or inflammatory changes. Vascular/Lymphatic: Aortic atherosclerosis. No enlarged abdominal or pelvic adenopathy. Reproductive: The prostate gland and seminal vesicles are unremarkable. Other: There is no ascites or focal fluid collections within the abdomen or pelvis. There is a fat containing left inguinal hernia noted. Musculoskeletal: Severe osteoarthritis involving both hips is noted multifocal sclerotic bone metastases involving the lumbar spine and pelvis  appears similar to the previous exam. IMPRESSION: 1. No significant interval change compared with previous exam. 2. Stable appearance of calcified pole and noncalcified mediastinal and hilar lymph nodes. 3. Stable widespread osseous metastases. 4. Similar appearance of diffuse ground-glass attenuation and mild interlobular septal thickening identified throughout  both lungs bilaterally. Electronically Signed   By: Kerby Moors M.D.   On: 08/02/2016 11:14    Medications: I have reviewed the patient's current medications.  Assessment/Plan: 1. Metastatic non-small cell lung cancer, adenocarcinomacurrently on active treatment with nivolumab status post 33 cycles. 2. Prostate cancer followed at [facility]. Treatment changed to Enzalutamide recently due to rising PSA. 3. Pain management. He takes Percocet as needed. 4. Metastatic bone disease. Plan for Xgeva after dental clearance received.   Disposition: Francisco Love appears stable. He has completed 33 cycles of nivolumab. Restaging CT scans from earlier today were stable. Plan to continue nivolumab with cycle 34 today.  He recently began Enzalutamide due to a rising PSA. He is followed at [facility].  He will return for a follow-up visit and nivolumab in 2 weeks. He will contact the office in the interim with any problems. He was provided with a new Percocet prescription at today's visit.  Patient seen with Dr. Julien Nordmann.    Ned Card ANP/GNP-BC   08/02/2016  11:34 AM  ADDENDUM: Hematology/Oncology Attending: I had a face to face encounter with the patient. I recommended his care plan. This is a very pleasant 55 years old African-American male with metastatic non-small cell lung cancer, adenocarcinoma currently undergoing treatment with immunotherapy with Nivolumab status post 33 cycles. The patient has been treating his treatment fairly well with no significant adverse effects. He had repeat CT scan of the chest, abdomen and pelvis performed  earlier today. I personally and independently reviewed the scan images and discuss the results with the patient and his brother-in-law. The scan showed no evidence for disease progression. I recommended for the patient to continue his current treatment with Nivolumab. He will proceed with cycle #4 today. He was recently found to have rising PSA and was seen by his oncologist at [facility] who recommended for him change of the hormonal therapy to Enzalutamide. I would see the patient back for follow-up visit in 2 weeks for evaluation before starting the next cycle of his treatment. For pain management was given a refill of Percocet today. He was advised to call immediately if he has any concerning symptoms in the interval.  Disclaimer: This note was dictated with voice recognition software. Similar sounding words can inadvertently be transcribed and may be missed upon review. Eilleen Kempf., MD 08/03/16

## 2016-08-02 NOTE — Progress Notes (Signed)
Ok to treat with creatinine per Ned Card, NP

## 2016-08-02 NOTE — Patient Instructions (Signed)
[facility] Discharge Instructions for Patients Receiving Chemotherapy  Today you received the following chemotherapy agents Opdivo.  To help prevent nausea and vomiting after your treatment, we encourage you to take your nausea medication as directed.   If you develop nausea and vomiting that is not controlled by your nausea medication, call the clinic.   BELOW ARE SYMPTOMS THAT SHOULD BE REPORTED IMMEDIATELY:  *FEVER GREATER THAN 100.5 F  *CHILLS WITH OR WITHOUT FEVER  NAUSEA AND VOMITING THAT IS NOT CONTROLLED WITH YOUR NAUSEA MEDICATION  *UNUSUAL SHORTNESS OF BREATH  *UNUSUAL BRUISING OR BLEEDING  TENDERNESS IN MOUTH AND THROAT WITH OR WITHOUT PRESENCE OF ULCERS  *URINARY PROBLEMS  *BOWEL PROBLEMS  UNUSUAL RASH Items with * indicate a potential emergency and should be followed up as soon as possible.  Feel free to call the clinic you have any questions or concerns. The clinic phone number is [phone].  Please show the CHEMO ALERT CARD at check-in to the Emergency Department and triage nurse.    

## 2016-08-16 ENCOUNTER — Ambulatory Visit (HOSPITAL_BASED_OUTPATIENT_CLINIC_OR_DEPARTMENT_OTHER): Payer: Medicare Other | Admitting: Internal Medicine

## 2016-08-16 ENCOUNTER — Telehealth: Payer: Self-pay | Admitting: Internal Medicine

## 2016-08-16 ENCOUNTER — Encounter: Payer: Self-pay | Admitting: Internal Medicine

## 2016-08-16 ENCOUNTER — Ambulatory Visit (HOSPITAL_BASED_OUTPATIENT_CLINIC_OR_DEPARTMENT_OTHER): Payer: Medicare Other

## 2016-08-16 ENCOUNTER — Other Ambulatory Visit (HOSPITAL_BASED_OUTPATIENT_CLINIC_OR_DEPARTMENT_OTHER): Payer: Medicare Other

## 2016-08-16 VITALS — BP 114/64 | HR 93 | Temp 98.0°F | Resp 19 | Wt 165.0 lb

## 2016-08-16 DIAGNOSIS — C801 Malignant (primary) neoplasm, unspecified: Principal | ICD-10-CM

## 2016-08-16 DIAGNOSIS — C787 Secondary malignant neoplasm of liver and intrahepatic bile duct: Secondary | ICD-10-CM

## 2016-08-16 DIAGNOSIS — Z79899 Other long term (current) drug therapy: Secondary | ICD-10-CM | POA: Diagnosis not present

## 2016-08-16 DIAGNOSIS — G894 Chronic pain syndrome: Secondary | ICD-10-CM

## 2016-08-16 DIAGNOSIS — G893 Neoplasm related pain (acute) (chronic): Secondary | ICD-10-CM

## 2016-08-16 DIAGNOSIS — C61 Malignant neoplasm of prostate: Secondary | ICD-10-CM | POA: Diagnosis not present

## 2016-08-16 DIAGNOSIS — Z5112 Encounter for antineoplastic immunotherapy: Secondary | ICD-10-CM

## 2016-08-16 DIAGNOSIS — C7951 Secondary malignant neoplasm of bone: Secondary | ICD-10-CM

## 2016-08-16 DIAGNOSIS — C3491 Malignant neoplasm of unspecified part of right bronchus or lung: Secondary | ICD-10-CM

## 2016-08-16 DIAGNOSIS — C7889 Secondary malignant neoplasm of other digestive organs: Secondary | ICD-10-CM | POA: Diagnosis not present

## 2016-08-16 DIAGNOSIS — C3401 Malignant neoplasm of right main bronchus: Secondary | ICD-10-CM | POA: Diagnosis not present

## 2016-08-16 DIAGNOSIS — R5382 Chronic fatigue, unspecified: Secondary | ICD-10-CM

## 2016-08-16 LAB — CBC WITH DIFFERENTIAL/PLATELET
BASO%: 1.2 % (ref 0.0–2.0)
Basophils Absolute: 0.1 10*3/uL (ref 0.0–0.1)
EOS ABS: 0.6 10*3/uL — AB (ref 0.0–0.5)
EOS%: 5.3 % (ref 0.0–7.0)
HEMATOCRIT: 35.7 % — AB (ref 38.4–49.9)
HGB: 11.9 g/dL — ABNORMAL LOW (ref 13.0–17.1)
LYMPH#: 2.1 10*3/uL (ref 0.9–3.3)
LYMPH%: 19.7 % (ref 14.0–49.0)
MCH: 30.7 pg (ref 27.2–33.4)
MCHC: 33.3 g/dL (ref 32.0–36.0)
MCV: 92.3 fL (ref 79.3–98.0)
MONO#: 0.9 10*3/uL (ref 0.1–0.9)
MONO%: 9.1 % (ref 0.0–14.0)
NEUT%: 64.7 % (ref 39.0–75.0)
NEUTROS ABS: 6.7 10*3/uL — AB (ref 1.5–6.5)
PLATELETS: 313 10*3/uL (ref 140–400)
RBC: 3.87 10*6/uL — ABNORMAL LOW (ref 4.20–5.82)
RDW: 17.5 % — ABNORMAL HIGH (ref 11.0–14.6)
WBC: 10.4 10*3/uL — ABNORMAL HIGH (ref 4.0–10.3)

## 2016-08-16 LAB — COMPREHENSIVE METABOLIC PANEL
ALT: 12 U/L (ref 0–55)
ANION GAP: 9 meq/L (ref 3–11)
AST: 14 U/L (ref 5–34)
Albumin: 3 g/dL — ABNORMAL LOW (ref 3.5–5.0)
Alkaline Phosphatase: 205 U/L — ABNORMAL HIGH (ref 40–150)
BILIRUBIN TOTAL: 0.28 mg/dL (ref 0.20–1.20)
BUN: 50 mg/dL — ABNORMAL HIGH (ref 7.0–26.0)
CALCIUM: 9.1 mg/dL (ref 8.4–10.4)
CO2: 15 mEq/L — ABNORMAL LOW (ref 22–29)
CREATININE: 2.5 mg/dL — AB (ref 0.7–1.3)
Chloride: 114 mEq/L — ABNORMAL HIGH (ref 98–109)
EGFR: 31 mL/min/{1.73_m2} — ABNORMAL LOW (ref >90)
Glucose: 84 mg/dl (ref 70–140)
Potassium: 5 mEq/L (ref 3.5–5.1)
Sodium: 139 mEq/L (ref 136–145)
TOTAL PROTEIN: 7.6 g/dL (ref 6.4–8.3)

## 2016-08-16 LAB — TSH: TSH: 2.104 m[IU]/L (ref 0.320–4.118)

## 2016-08-16 MED ORDER — SODIUM CHLORIDE 0.9 % IJ SOLN
10.0000 mL | INTRAMUSCULAR | Status: DC | PRN
  Administered 2016-08-16: 10 mL
  Filled 2016-08-16: qty 10

## 2016-08-16 MED ORDER — HEPARIN SOD (PORK) LOCK FLUSH 100 UNIT/ML IV SOLN
500.0000 [IU] | Freq: Once | INTRAVENOUS | Status: AC | PRN
  Administered 2016-08-16: 500 [IU]
  Filled 2016-08-16: qty 5

## 2016-08-16 MED ORDER — OXYCODONE-ACETAMINOPHEN 5-325 MG PO TABS: 1.0000 | ORAL_TABLET | ORAL | 0 refills | Status: DC | PRN

## 2016-08-16 MED ORDER — SODIUM CHLORIDE 0.9 % IV SOLN
Freq: Once | INTRAVENOUS | Status: AC
  Administered 2016-08-16: 09:00:00 via INTRAVENOUS

## 2016-08-16 MED ORDER — SODIUM CHLORIDE 0.9 % IV SOLN
240.0000 mg | Freq: Once | INTRAVENOUS | Status: AC
  Administered 2016-08-16: 240 mg via INTRAVENOUS
  Filled 2016-08-16: qty 4

## 2016-08-16 NOTE — Patient Instructions (Signed)
[facility] Discharge Instructions for Patients Receiving Chemotherapy  Today you received the following chemotherapy agents:  Nivolumab.  To help prevent nausea and vomiting after your treatment, we encourage you to take your nausea medication as directed.   If you develop nausea and vomiting that is not controlled by your nausea medication, call the clinic.   BELOW ARE SYMPTOMS THAT SHOULD BE REPORTED IMMEDIATELY:  *FEVER GREATER THAN 100.5 F  *CHILLS WITH OR WITHOUT FEVER  NAUSEA AND VOMITING THAT IS NOT CONTROLLED WITH YOUR NAUSEA MEDICATION  *UNUSUAL SHORTNESS OF BREATH  *UNUSUAL BRUISING OR BLEEDING  TENDERNESS IN MOUTH AND THROAT WITH OR WITHOUT PRESENCE OF ULCERS  *URINARY PROBLEMS  *BOWEL PROBLEMS  UNUSUAL RASH Items with * indicate a potential emergency and should be followed up as soon as possible.  Feel free to call the clinic you have any questions or concerns. The clinic phone number is [phone].  Please show the CHEMO ALERT CARD at check-in to the Emergency Department and triage nurse.   

## 2016-08-16 NOTE — Progress Notes (Signed)
[place] [phone]   [phone]  OFFICE PROGRESS NOTE  [name], [name], MD [place] [address]  DIAGNOSIS:  1) Stage IV (T2b, N2, M1b) non-small cell lung cancer, adenocarcinoma with negative EGFR mutation and negative gene translocation presented with a large right hilar mass as well as mediastinal lymphadenopathy and metastatic disease to the liver, bone and pancreas diagnosed in February 2016. 2) prostate adenocarcinoma diagnosed at [facility] with Gleason score 9 (4+5).  PRIOR THERAPY: Systemic chemotherapy with carboplatin for AUC of 5 and Alimta 500 MG/M2 every 3 weeks. Status post 6 cycles.  CURRENT THERAPY:  1) Immunotherapy with Nivolumab 240 MG every 2 weeks, status post 34 cycles. 2) Lupron and Casodex for the recently diagnosed prostate adenocarcinoma at [place]: Francisco Love 55 y.o. male returns to the clinic today accompanied by his brother-in-law. The patient is feeling fine today with no specific complaints. He has no skin rash. He denied having any nausea, vomiting, diarrhea or constipation. He has no fever or chills. The patient denied having any chest pain, shortness of breath, cough or hemoptysis. He is here today for evaluation before starting cycle #35 of his immunotherapy.  MEDICAL HISTORY: Past Medical History:  Diagnosis Date  . Chronic fatigue 04/12/2016  . Chronic pain 04/12/2016  . Shortness of breath dyspnea     ALLERGIES:  has No Known Allergies.  MEDICATIONS:  Current Outpatient Prescriptions  Medication Sig Dispense Refill  . acetaminophen (TYLENOL) 325 MG tablet Take 2 tablets (650 mg total) by mouth every 6 (six) hours as needed for mild pain (or Fever >/= 101).    Marland Kitchen albuterol (PROVENTIL) (2.5 MG/3ML) 0.083% nebulizer solution Take 3 mLs (2.5 mg total) by nebulization every 2 (two) hours as needed for wheezing. 75 mL 12  . AMITIZA 24 MCG capsule      . bicalutamide (CASODEX) 50 MG tablet Take 50 mg by mouth.    . diphenhydrAMINE (BENADRYL) 25 MG tablet Take 25 mg by mouth.    . feeding supplement, ENSURE COMPLETE, (ENSURE COMPLETE) LIQD Take 237 mLs by mouth 3 (three) times daily between meals.    Marland Kitchen FLUOCINOLONE ACETONIDE BODY 0.01 % OIL     . levocetirizine (XYZAL) 5 MG tablet Take 5 mg by mouth.    . lidocaine-prilocaine (EMLA) cream Apply 1 application topically as needed. Apply to port site prior to chemotherapy. 30 g 0  . mirtazapine (REMERON) 15 MG tablet Take 1 tablet (15 mg total) by mouth at bedtime.    Marland Kitchen oxyCODONE-acetaminophen (PERCOCET/ROXICET) 5-325 MG tablet Take 1 tablet by mouth every 4 (four) hours as needed for severe pain. 60 tablet 0  . polyethylene glycol (MIRALAX / GLYCOLAX) packet Take 17 g by mouth daily.    . prochlorperazine (COMPAZINE) 10 MG tablet Take 1 tablet (10 mg total) by mouth every 6 (six) hours as needed for nausea or vomiting. 30 tablet 1  . senna-docusate (SENOKOT-S) 8.6-50 MG per tablet Take 1 tablet by mouth daily.    . SUMAtriptan (IMITREX) 25 MG tablet     . tamsulosin (FLOMAX) 0.4 MG CAPS capsule     . triamcinolone ointment (KENALOG) 0.1 % Apply to arms twice a day until clear, then stop.    [name] 40 MG capsule      No current facility-administered medications for this visit.     SURGICAL HISTORY:  Past Surgical History:  Procedure  Laterality Date  . APPENDECTOMY    . VIDEO BRONCHOSCOPY N/A 09/21/2014   Procedure: VIDEO BRONCHOSCOPY WITH FLUORO;  Surgeon: Rigoberto Noel, MD;  Location: [facility];  Service: Cardiopulmonary;  Laterality: N/A;  . VIDEO BRONCHOSCOPY WITH ENDOBRONCHIAL ULTRASOUND N/A 09/23/2014   Procedure: VIDEO BRONCHOSCOPY WITH ENDOBRONCHIAL ULTRASOUND;  Surgeon: Rigoberto Noel, MD;  Location: [facility];  Service: Pulmonary;  Laterality: N/A;    REVIEW OF SYSTEMS:  A comprehensive review of systems was negative.   PHYSICAL EXAMINATION: General appearance: alert,  cooperative and no distress Head: Normocephalic, without obvious abnormality, atraumatic Neck: no adenopathy, no JVD, supple, symmetrical, trachea midline and thyroid not enlarged, symmetric, no tenderness/mass/nodules Lymph nodes: Cervical, supraclavicular, and axillary nodes normal. Resp: clear to auscultation bilaterally Back: symmetric, no curvature. ROM normal. No CVA tenderness. Cardio: regular rate and rhythm, S1, S2 normal, no murmur, click, rub or gallop GI: soft, non-tender; bowel sounds normal; no masses,  no organomegaly Extremities: extremities normal, atraumatic, no cyanosis or edema  ECOG PERFORMANCE STATUS: 1 - Symptomatic but completely ambulatory  There were no vitals taken for this visit.  LABORATORY DATA: Lab Results  Component Value Date   WBC 10.4 (H) 08/16/2016   HGB 11.9 (L) 08/16/2016   HCT 35.7 (L) 08/16/2016   MCV 92.3 08/16/2016   PLT 313 08/16/2016      Chemistry      Component Value Date/Time   NA 138 08/02/2016 1100   K 5.3 (H) 08/02/2016 1100   CL 108 10/25/2014 0845   CO2 14 (L) 08/02/2016 1100   BUN 46.4 (H) 08/02/2016 1100   CREATININE 2.4 (H) 08/02/2016 1100      Component Value Date/Time   CALCIUM 9.2 08/02/2016 1100   ALKPHOS 236 (H) 08/02/2016 1100   AST 15 08/02/2016 1100   ALT 17 08/02/2016 1100   BILITOT 0.22 08/02/2016 1100       RADIOGRAPHIC STUDIES: Ct Abdomen Pelvis Wo Contrast  Result Date: 08/02/2016 CLINICAL DATA:  Followup lung cancer EXAM: CT CHEST, ABDOMEN AND PELVIS WITHOUT CONTRAST TECHNIQUE: Multidetector CT imaging of the chest, abdomen and pelvis was performed following the standard protocol without IV contrast. COMPARISON:  05/24/2016 FINDINGS: CT CHEST FINDINGS Cardiovascular: The heart size appears normal. No pericardial effusion identified. Aortic atherosclerosis is identified. Calcification in the LAD coronary artery is identified. Mediastinum/Nodes: The trachea appears patent and is midline. Normal  appearance of the esophagus. The index partially calcified right paratracheal lymph node measure 2.4 cm, image 20 of series 2. Previously 2.7 cm. Partially calcified right paratracheal lymph node measures 1.6 cm, image 24 of series 2. Unchanged from previous exam. Lungs/Pleura: Mild pleural thickening overlying the posterior right lung. Small left pleural effusion is again noted and appears unchanged. Moderate changes of centrilobular and paraseptal emphysema identified. Background of mild diffuse ground-glass attenuation and interlobular septal thickening is again noted. Noncalcified right lower lobe lung nodule is identified measuring 6 mm, image number 80 of series 4. Unchanged from previous exam. Calcified granuloma within the left upper lobe is stable. No new or enlarging pulmonary nodules or masses identified. Musculoskeletal: Multifocal areas of sclerotic bone metastases identified. The pattern of disease is similar to the previous exam. CT ABDOMEN PELVIS FINDINGS Hepatobiliary: Within the limitations of unenhanced technique there is no suspicious liver abnormality identified. The gallbladder appears normal. No biliary dilatation. Calcifications are again noted within head of pancreas. The low-attenuation structure within the head of pancreas is unchanged measuring 1.4 cm, image 68 of series 2. Pancreas:  Normal appearance of the pancreas. Spleen: The spleen is unremarkable. Adrenals/Urinary Tract: Normal appearance of the adrenal glands. The right kidney appears normal. There is mild atrophy of the left kidney. No mass or hydronephrosis. The urinary bladder appears normal. Stomach/Bowel: Stomach is within normal limits. Appendix appears normal. No evidence of bowel wall thickening, distention, or inflammatory changes. Vascular/Lymphatic: Aortic atherosclerosis. No enlarged abdominal or pelvic adenopathy. Reproductive: The prostate gland and seminal vesicles are unremarkable. Other: There is no ascites or  focal fluid collections within the abdomen or pelvis. There is a fat containing left inguinal hernia noted. Musculoskeletal: Severe osteoarthritis involving both hips is noted multifocal sclerotic bone metastases involving the lumbar spine and pelvis appears similar to the previous exam. IMPRESSION: 1. No significant interval change compared with previous exam. 2. Stable appearance of calcified pole and noncalcified mediastinal and hilar lymph nodes. 3. Stable widespread osseous metastases. 4. Similar appearance of diffuse ground-glass attenuation and mild interlobular septal thickening identified throughout both lungs bilaterally. Electronically Signed   By: Kerby Moors M.D.   On: 08/02/2016 11:14   Ct Chest Wo Contrast  Result Date: 08/02/2016 CLINICAL DATA:  Followup lung cancer EXAM: CT CHEST, ABDOMEN AND PELVIS WITHOUT CONTRAST TECHNIQUE: Multidetector CT imaging of the chest, abdomen and pelvis was performed following the standard protocol without IV contrast. COMPARISON:  05/24/2016 FINDINGS: CT CHEST FINDINGS Cardiovascular: The heart size appears normal. No pericardial effusion identified. Aortic atherosclerosis is identified. Calcification in the LAD coronary artery is identified. Mediastinum/Nodes: The trachea appears patent and is midline. Normal appearance of the esophagus. The index partially calcified right paratracheal lymph node measure 2.4 cm, image 20 of series 2. Previously 2.7 cm. Partially calcified right paratracheal lymph node measures 1.6 cm, image 24 of series 2. Unchanged from previous exam. Lungs/Pleura: Mild pleural thickening overlying the posterior right lung. Small left pleural effusion is again noted and appears unchanged. Moderate changes of centrilobular and paraseptal emphysema identified. Background of mild diffuse ground-glass attenuation and interlobular septal thickening is again noted. Noncalcified right lower lobe lung nodule is identified measuring 6 mm, image  number 80 of series 4. Unchanged from previous exam. Calcified granuloma within the left upper lobe is stable. No new or enlarging pulmonary nodules or masses identified. Musculoskeletal: Multifocal areas of sclerotic bone metastases identified. The pattern of disease is similar to the previous exam. CT ABDOMEN PELVIS FINDINGS Hepatobiliary: Within the limitations of unenhanced technique there is no suspicious liver abnormality identified. The gallbladder appears normal. No biliary dilatation. Calcifications are again noted within head of pancreas. The low-attenuation structure within the head of pancreas is unchanged measuring 1.4 cm, image 68 of series 2. Pancreas: Normal appearance of the pancreas. Spleen: The spleen is unremarkable. Adrenals/Urinary Tract: Normal appearance of the adrenal glands. The right kidney appears normal. There is mild atrophy of the left kidney. No mass or hydronephrosis. The urinary bladder appears normal. Stomach/Bowel: Stomach is within normal limits. Appendix appears normal. No evidence of bowel wall thickening, distention, or inflammatory changes. Vascular/Lymphatic: Aortic atherosclerosis. No enlarged abdominal or pelvic adenopathy. Reproductive: The prostate gland and seminal vesicles are unremarkable. Other: There is no ascites or focal fluid collections within the abdomen or pelvis. There is a fat containing left inguinal hernia noted. Musculoskeletal: Severe osteoarthritis involving both hips is noted multifocal sclerotic bone metastases involving the lumbar spine and pelvis appears similar to the previous exam. IMPRESSION: 1. No significant interval change compared with previous exam. 2. Stable appearance of calcified pole and noncalcified mediastinal and  hilar lymph nodes. 3. Stable widespread osseous metastases. 4. Similar appearance of diffuse ground-glass attenuation and mild interlobular septal thickening identified throughout both lungs bilaterally. Electronically Signed    By: Kerby Moors M.D.   On: 08/02/2016 11:14   ASSESSMENT AND PLAN:  This is a very pleasant 55 years old African-American male with metastatic non-small cell lung cancer currently undergoing treatment with immunotherapy with Nivolumab status post 34 cycles. He is tolerating his treatment well with no significant adverse effects. I recommended for the patient to proceed with cycle #35 today as scheduled. The patient also has a history of prostate adenocarcinoma and currently on treatment with Enzalutamide. For pain management I gave the patient refill of Percocet today. He would come back for follow-up visit in 2 weeks for evaluation before starting cycle #36. He was advised to call immediately if he has any concerning symptoms in the interval. The patient voices understanding of current disease status and treatment options and is in agreement with the current care plan.  All questions were answered. The patient knows to call the clinic with any problems, questions or concerns. We can certainly see the patient much sooner if necessary. I spent 10 minutes counseling the patient face to face. The total time spent in the appointment was 15 minutes. Disclaimer: This note was dictated with voice recognition software. Similar sounding words can inadvertently be transcribed and may not be corrected upon review.

## 2016-08-16 NOTE — Progress Notes (Signed)
Dr. Julien Nordmann okay to tx pt today with Ctn 2.5.

## 2016-08-16 NOTE — Telephone Encounter (Signed)
Labs, chemo and Follow up with Dr Julien Nordmann was scheduled every 2 weeks, per 08/16/16 los, throughout 10/25/16. A copy of the updated appointment schedule and AVS report was given to the patient.

## 2016-08-29 ENCOUNTER — Ambulatory Visit (HOSPITAL_BASED_OUTPATIENT_CLINIC_OR_DEPARTMENT_OTHER): Payer: Medicare Other

## 2016-08-29 ENCOUNTER — Ambulatory Visit (HOSPITAL_BASED_OUTPATIENT_CLINIC_OR_DEPARTMENT_OTHER): Payer: Medicare Other | Admitting: Internal Medicine

## 2016-08-29 ENCOUNTER — Telehealth: Payer: Self-pay | Admitting: Internal Medicine

## 2016-08-29 ENCOUNTER — Other Ambulatory Visit (HOSPITAL_BASED_OUTPATIENT_CLINIC_OR_DEPARTMENT_OTHER): Payer: Medicare Other

## 2016-08-29 ENCOUNTER — Encounter: Payer: Self-pay | Admitting: Internal Medicine

## 2016-08-29 DIAGNOSIS — C3491 Malignant neoplasm of unspecified part of right bronchus or lung: Secondary | ICD-10-CM

## 2016-08-29 DIAGNOSIS — C7951 Secondary malignant neoplasm of bone: Secondary | ICD-10-CM | POA: Diagnosis not present

## 2016-08-29 DIAGNOSIS — C3401 Malignant neoplasm of right main bronchus: Secondary | ICD-10-CM

## 2016-08-29 DIAGNOSIS — M545 Low back pain: Secondary | ICD-10-CM | POA: Diagnosis not present

## 2016-08-29 DIAGNOSIS — C787 Secondary malignant neoplasm of liver and intrahepatic bile duct: Secondary | ICD-10-CM | POA: Diagnosis not present

## 2016-08-29 DIAGNOSIS — Z79899 Other long term (current) drug therapy: Secondary | ICD-10-CM

## 2016-08-29 DIAGNOSIS — G893 Neoplasm related pain (acute) (chronic): Secondary | ICD-10-CM

## 2016-08-29 DIAGNOSIS — C7889 Secondary malignant neoplasm of other digestive organs: Secondary | ICD-10-CM

## 2016-08-29 DIAGNOSIS — Z5112 Encounter for antineoplastic immunotherapy: Secondary | ICD-10-CM | POA: Diagnosis present

## 2016-08-29 LAB — COMPREHENSIVE METABOLIC PANEL
ALT: 14 U/L (ref 0–55)
AST: 15 U/L (ref 5–34)
Albumin: 3 g/dL — ABNORMAL LOW (ref 3.5–5.0)
Alkaline Phosphatase: 223 U/L — ABNORMAL HIGH (ref 40–150)
Anion Gap: 10 mEq/L (ref 3–11)
BUN: 41 mg/dL — AB (ref 7.0–26.0)
CO2: 18 meq/L — AB (ref 22–29)
CREATININE: 2.2 mg/dL — AB (ref 0.7–1.3)
Calcium: 9.5 mg/dL (ref 8.4–10.4)
Chloride: 113 mEq/L — ABNORMAL HIGH (ref 98–109)
EGFR: 36 mL/min/{1.73_m2} — ABNORMAL LOW (ref >90)
GLUCOSE: 88 mg/dL (ref 70–140)
Potassium: 5.3 mEq/L — ABNORMAL HIGH (ref 3.5–5.1)
SODIUM: 141 meq/L (ref 136–145)
Total Bilirubin: 0.28 mg/dL (ref 0.20–1.20)
Total Protein: 7.7 g/dL (ref 6.4–8.3)

## 2016-08-29 LAB — CBC WITH DIFFERENTIAL/PLATELET
BASO%: 1.1 % (ref 0.0–2.0)
Basophils Absolute: 0.1 10*3/uL (ref 0.0–0.1)
EOS%: 6.1 % (ref 0.0–7.0)
Eosinophils Absolute: 0.8 10*3/uL — ABNORMAL HIGH (ref 0.0–0.5)
HCT: 39.2 % (ref 38.4–49.9)
HGB: 12.7 g/dL — ABNORMAL LOW (ref 13.0–17.1)
LYMPH%: 20.7 % (ref 14.0–49.0)
MCH: 30.4 pg (ref 27.2–33.4)
MCHC: 32.5 g/dL (ref 32.0–36.0)
MCV: 93.4 fL (ref 79.3–98.0)
MONO#: 1 10*3/uL — AB (ref 0.1–0.9)
MONO%: 7.9 % (ref 0.0–14.0)
NEUT%: 64.2 % (ref 39.0–75.0)
NEUTROS ABS: 8 10*3/uL — AB (ref 1.5–6.5)
PLATELETS: 364 10*3/uL (ref 140–400)
RBC: 4.19 10*6/uL — AB (ref 4.20–5.82)
RDW: 17.9 % — ABNORMAL HIGH (ref 11.0–14.6)
WBC: 12.5 10*3/uL — AB (ref 4.0–10.3)
lymph#: 2.6 10*3/uL (ref 0.9–3.3)

## 2016-08-29 LAB — TSH: TSH: 3.346 m(IU)/L (ref 0.320–4.118)

## 2016-08-29 MED ORDER — SODIUM CHLORIDE 0.9 % IJ SOLN
10.0000 mL | INTRAMUSCULAR | Status: DC | PRN
  Administered 2016-08-29: 10 mL
  Filled 2016-08-29: qty 10

## 2016-08-29 MED ORDER — HEPARIN SOD (PORK) LOCK FLUSH 100 UNIT/ML IV SOLN
500.0000 [IU] | Freq: Once | INTRAVENOUS | Status: AC | PRN
  Administered 2016-08-29: 500 [IU]
  Filled 2016-08-29: qty 5

## 2016-08-29 MED ORDER — SODIUM CHLORIDE 0.9 % IV SOLN
Freq: Once | INTRAVENOUS | Status: AC
  Administered 2016-08-29: 13:00:00 via INTRAVENOUS

## 2016-08-29 MED ORDER — OXYCODONE-ACETAMINOPHEN 5-325 MG PO TABS: 1.0000 | ORAL_TABLET | ORAL | 0 refills | Status: DC | PRN

## 2016-08-29 MED ORDER — SODIUM CHLORIDE 0.9 % IV SOLN
240.0000 mg | Freq: Once | INTRAVENOUS | Status: AC
  Administered 2016-08-29: 240 mg via INTRAVENOUS
  Filled 2016-08-29: qty 20

## 2016-08-29 NOTE — Progress Notes (Signed)
[place] [phone]   Fax:(336) [phone]  OFFICE PROGRESS NOTE  Francisco Love, Francisco Heinrich, MD [place] [address]  DIAGNOSIS:  1) Stage IV (T2b, N2, M1b) non-small cell lung cancer, adenocarcinoma with negative EGFR mutation and negative gene translocation presented with a large right hilar mass as well as mediastinal lymphadenopathy and metastatic disease to the liver, bone and pancreas diagnosed in February 2016. 2) prostate adenocarcinoma diagnosed at [facility] with Gleason score 9 (4+5).  PRIOR THERAPY: Systemic chemotherapy with carboplatin for AUC of 5 and Alimta 500 MG/M2 every 3 weeks. Status post 6 cycles.  CURRENT THERAPY:  1) Immunotherapy with Nivolumab 240 MG every 2 weeks, status post 35 cycles. 2) Enzalutamide for prostate cancer at [place]: Francisco Love 56 y.o. male came to the clinic today accompanied by his brother-in-law for follow-up visit. The patient continues to tolerate his treatment with immunotherapy with Nivolumab fairly well. He denied having any adverse effects. He has no skin rash. He denied having any nausea, vomiting, diarrhea or constipation. He continues to have mild low back pain and requesting refill of his pain medication. He has no chest pain, shortness breath, cough or hemoptysis. He is here today for evaluation before starting cycle #36 of his treatment.  MEDICAL HISTORY: Past Medical History:  Diagnosis Date  . Chronic fatigue 04/12/2016  . Chronic pain 04/12/2016  . Shortness of breath dyspnea     ALLERGIES:  has No Known Allergies.  MEDICATIONS:  Current Outpatient Prescriptions  Medication Sig Dispense Refill  . acetaminophen (TYLENOL) 325 MG tablet Take 2 tablets (650 mg total) by mouth every 6 (six) hours as needed for mild pain (or Fever >/= 101).    Marland Kitchen albuterol (PROVENTIL) (2.5 MG/3ML) 0.083% nebulizer solution Take 3 mLs (2.5 mg total) by  nebulization every 2 (two) hours as needed for wheezing. 75 mL 12  . AMITIZA 24 MCG capsule     . bicalutamide (CASODEX) 50 MG tablet Take 50 mg by mouth.    . diphenhydrAMINE (BENADRYL) 25 MG tablet Take 25 mg by mouth.    . feeding supplement, ENSURE COMPLETE, (ENSURE COMPLETE) LIQD Take 237 mLs by mouth 3 (three) times daily between meals.    Marland Kitchen FLUOCINOLONE ACETONIDE BODY 0.01 % OIL     . levocetirizine (XYZAL) 5 MG tablet Take 5 mg by mouth.    . lidocaine-prilocaine (EMLA) cream Apply 1 application topically as needed. Apply to port site prior to chemotherapy. 30 g 0  . mirtazapine (REMERON) 15 MG tablet Take 1 tablet (15 mg total) by mouth at bedtime.    Marland Kitchen oxyCODONE-acetaminophen (PERCOCET/ROXICET) 5-325 MG tablet Take 1 tablet by mouth every 4 (four) hours as needed for severe pain. 60 tablet 0  . polyethylene glycol (MIRALAX / GLYCOLAX) packet Take 17 g by mouth daily.    Marland Kitchen senna-docusate (SENOKOT-S) 8.6-50 MG per tablet Take 1 tablet by mouth daily.    . tamsulosin (FLOMAX) 0.4 MG CAPS capsule     . triamcinolone ointment (KENALOG) 0.1 % Apply to arms twice a day until clear, then stop.    Francisco Love 40 MG capsule     . prochlorperazine (COMPAZINE) 10 MG tablet Take 1 tablet (10 mg total) by mouth every 6 (six) hours as needed for nausea or vomiting. (Patient not taking: Reported on 08/29/2016) 30 tablet 1  . SUMAtriptan (IMITREX) 25 MG tablet  No current facility-administered medications for this visit.     SURGICAL HISTORY:  Past Surgical History:  Procedure Laterality Date  . APPENDECTOMY    . VIDEO BRONCHOSCOPY N/A 09/21/2014   Procedure: VIDEO BRONCHOSCOPY WITH FLUORO;  Surgeon: Francisco Noel, MD;  Location: [place];  Service: Cardiopulmonary;  Laterality: N/A;  . VIDEO BRONCHOSCOPY WITH ENDOBRONCHIAL ULTRASOUND N/A 09/23/2014   Procedure: VIDEO BRONCHOSCOPY WITH ENDOBRONCHIAL ULTRASOUND;  Surgeon: Francisco Noel, MD;  Location: [place];  Service: Pulmonary;  Laterality: N/A;     REVIEW OF SYSTEMS:  A comprehensive review of systems was negative except for: Musculoskeletal: positive for back pain   PHYSICAL EXAMINATION: General appearance: alert, cooperative and no distress Head: Normocephalic, without obvious abnormality, atraumatic Neck: no adenopathy, no JVD, supple, symmetrical, trachea midline and thyroid not enlarged, symmetric, no tenderness/mass/nodules Lymph nodes: Cervical, supraclavicular, and axillary nodes normal. Resp: clear to auscultation bilaterally Back: symmetric, no curvature. ROM normal. No CVA tenderness. Cardio: regular rate and rhythm, S1, S2 normal, no murmur, click, rub or gallop GI: soft, non-tender; bowel sounds normal; no masses,  no organomegaly Extremities: extremities normal, atraumatic, no cyanosis or edema  ECOG PERFORMANCE STATUS: 1 - Symptomatic but completely ambulatory  Blood pressure (!) 117/91, pulse 99, temperature 97.6 F (36.4 C), temperature source Oral, resp. rate 19, height 5' 9"  (1.753 m), weight 163 lb 1.6 oz (74 kg), SpO2 100 %.  LABORATORY DATA: Lab Results  Component Value Date   WBC 12.5 (H) 08/29/2016   HGB 12.7 (L) 08/29/2016   HCT 39.2 08/29/2016   MCV 93.4 08/29/2016   PLT 364 08/29/2016      Chemistry      Component Value Date/Time   NA 139 08/16/2016 0813   K 5.0 08/16/2016 0813   CL 108 10/25/2014 0845   CO2 15 (L) 08/16/2016 0813   BUN 50.0 (H) 08/16/2016 0813   CREATININE 2.5 (H) 08/16/2016 0813      Component Value Date/Time   CALCIUM 9.1 08/16/2016 0813   ALKPHOS 205 (H) 08/16/2016 0813   AST 14 08/16/2016 0813   ALT 12 08/16/2016 0813   BILITOT 0.28 08/16/2016 0813       RADIOGRAPHIC STUDIES: Ct Abdomen Pelvis Wo Contrast  Result Date: 08/02/2016 CLINICAL DATA:  Followup lung cancer EXAM: CT CHEST, ABDOMEN AND PELVIS WITHOUT CONTRAST TECHNIQUE: Multidetector CT imaging of the chest, abdomen and pelvis was performed following the standard protocol without IV contrast.  COMPARISON:  05/24/2016 FINDINGS: CT CHEST FINDINGS Cardiovascular: The heart size appears normal. No pericardial effusion identified. Aortic atherosclerosis is identified. Calcification in the LAD coronary artery is identified. Mediastinum/Nodes: The trachea appears patent and is midline. Normal appearance of the esophagus. The index partially calcified right paratracheal lymph node measure 2.4 cm, image 20 of series 2. Previously 2.7 cm. Partially calcified right paratracheal lymph node measures 1.6 cm, image 24 of series 2. Unchanged from previous exam. Lungs/Pleura: Mild pleural thickening overlying the posterior right lung. Small left pleural effusion is again noted and appears unchanged. Moderate changes of centrilobular and paraseptal emphysema identified. Background of mild diffuse ground-glass attenuation and interlobular septal thickening is again noted. Noncalcified right lower lobe lung nodule is identified measuring 6 mm, image number 80 of series 4. Unchanged from previous exam. Calcified granuloma within the left upper lobe is stable. No new or enlarging pulmonary nodules or masses identified. Musculoskeletal: Multifocal areas of sclerotic bone metastases identified. The pattern of disease is similar to the previous exam. CT ABDOMEN PELVIS FINDINGS  Hepatobiliary: Within the limitations of unenhanced technique there is no suspicious liver abnormality identified. The gallbladder appears normal. No biliary dilatation. Calcifications are again noted within head of pancreas. The low-attenuation structure within the head of pancreas is unchanged measuring 1.4 cm, image 68 of series 2. Pancreas: Normal appearance of the pancreas. Spleen: The spleen is unremarkable. Adrenals/Urinary Tract: Normal appearance of the adrenal glands. The right kidney appears normal. There is mild atrophy of the left kidney. No mass or hydronephrosis. The urinary bladder appears normal. Stomach/Bowel: Stomach is within normal  limits. Appendix appears normal. No evidence of bowel wall thickening, distention, or inflammatory changes. Vascular/Lymphatic: Aortic atherosclerosis. No enlarged abdominal or pelvic adenopathy. Reproductive: The prostate gland and seminal vesicles are unremarkable. Other: There is no ascites or focal fluid collections within the abdomen or pelvis. There is a fat containing left inguinal hernia noted. Musculoskeletal: Severe osteoarthritis involving both hips is noted multifocal sclerotic bone metastases involving the lumbar spine and pelvis appears similar to the previous exam. IMPRESSION: 1. No significant interval change compared with previous exam. 2. Stable appearance of calcified pole and noncalcified mediastinal and hilar lymph nodes. 3. Stable widespread osseous metastases. 4. Similar appearance of diffuse ground-glass attenuation and mild interlobular septal thickening identified throughout both lungs bilaterally. Electronically Signed   By: Kerby Moors M.D.   On: 08/02/2016 11:14   Ct Chest Wo Contrast  Result Date: 08/02/2016 CLINICAL DATA:  Followup lung cancer EXAM: CT CHEST, ABDOMEN AND PELVIS WITHOUT CONTRAST TECHNIQUE: Multidetector CT imaging of the chest, abdomen and pelvis was performed following the standard protocol without IV contrast. COMPARISON:  05/24/2016 FINDINGS: CT CHEST FINDINGS Cardiovascular: The heart size appears normal. No pericardial effusion identified. Aortic atherosclerosis is identified. Calcification in the LAD coronary artery is identified. Mediastinum/Nodes: The trachea appears patent and is midline. Normal appearance of the esophagus. The index partially calcified right paratracheal lymph node measure 2.4 cm, image 20 of series 2. Previously 2.7 cm. Partially calcified right paratracheal lymph node measures 1.6 cm, image 24 of series 2. Unchanged from previous exam. Lungs/Pleura: Mild pleural thickening overlying the posterior right lung. Small left pleural  effusion is again noted and appears unchanged. Moderate changes of centrilobular and paraseptal emphysema identified. Background of mild diffuse ground-glass attenuation and interlobular septal thickening is again noted. Noncalcified right lower lobe lung nodule is identified measuring 6 mm, image number 80 of series 4. Unchanged from previous exam. Calcified granuloma within the left upper lobe is stable. No new or enlarging pulmonary nodules or masses identified. Musculoskeletal: Multifocal areas of sclerotic bone metastases identified. The pattern of disease is similar to the previous exam. CT ABDOMEN PELVIS FINDINGS Hepatobiliary: Within the limitations of unenhanced technique there is no suspicious liver abnormality identified. The gallbladder appears normal. No biliary dilatation. Calcifications are again noted within head of pancreas. The low-attenuation structure within the head of pancreas is unchanged measuring 1.4 cm, image 68 of series 2. Pancreas: Normal appearance of the pancreas. Spleen: The spleen is unremarkable. Adrenals/Urinary Tract: Normal appearance of the adrenal glands. The right kidney appears normal. There is mild atrophy of the left kidney. No mass or hydronephrosis. The urinary bladder appears normal. Stomach/Bowel: Stomach is within normal limits. Appendix appears normal. No evidence of bowel wall thickening, distention, or inflammatory changes. Vascular/Lymphatic: Aortic atherosclerosis. No enlarged abdominal or pelvic adenopathy. Reproductive: The prostate gland and seminal vesicles are unremarkable. Other: There is no ascites or focal fluid collections within the abdomen or pelvis. There is a fat containing  left inguinal hernia noted. Musculoskeletal: Severe osteoarthritis involving both hips is noted multifocal sclerotic bone metastases involving the lumbar spine and pelvis appears similar to the previous exam. IMPRESSION: 1. No significant interval change compared with previous  exam. 2. Stable appearance of calcified pole and noncalcified mediastinal and hilar lymph nodes. 3. Stable widespread osseous metastases. 4. Similar appearance of diffuse ground-glass attenuation and mild interlobular septal thickening identified throughout both lungs bilaterally. Electronically Signed   By: Kerby Moors M.D.   On: 08/02/2016 11:14   ASSESSMENT AND PLAN:  This is a very pleasant 56 years old African-American male with metastatic non-small cell lung cancer, adenocarcinoma. He is currently undergoing treatment with immunotherapy with Nivolumab status post 35 cycles. The patient continues to tolerate his immunotherapy well with no significant adverse effects. I recommended for him to proceed with cycle #36 today as a scheduled. He would come back for follow-up visit in 2 weeks for evaluation before the next cycle of his treatment. For the low back pain, he was given a refill of Percocet today. He was advised to call immediately if he has any concerning symptoms in the interval. The patient voices understanding of current disease status and treatment options and is in agreement with the current care plan.  All questions were answered. The patient knows to call the clinic with any problems, questions or concerns. We can certainly see the patient much sooner if necessary. I spent 10 minutes counseling the patient face to face. The total time spent in the appointment was 15 minutes.  Disclaimer: This note was dictated with voice recognition software. Similar sounding words can inadvertently be transcribed and may not be corrected upon review.

## 2016-08-29 NOTE — Progress Notes (Signed)
Per Dr Julien Nordmann it is okay to treat pt today with nivolumab and serum creatinine of 2.2.

## 2016-08-29 NOTE — Telephone Encounter (Signed)
PATIENT CURRENTLY ON SCHEDULE FOR LAB/FU/TX Q2W THRU MARCH.

## 2016-08-30 ENCOUNTER — Ambulatory Visit: Payer: Medicare Other | Admitting: Internal Medicine

## 2016-08-30 ENCOUNTER — Ambulatory Visit: Payer: Medicare Other

## 2016-08-30 ENCOUNTER — Other Ambulatory Visit: Payer: Medicare Other

## 2016-09-13 ENCOUNTER — Telehealth: Payer: Self-pay | Admitting: Internal Medicine

## 2016-09-13 ENCOUNTER — Ambulatory Visit (HOSPITAL_BASED_OUTPATIENT_CLINIC_OR_DEPARTMENT_OTHER): Payer: Medicare Other | Admitting: Internal Medicine

## 2016-09-13 ENCOUNTER — Telehealth: Payer: Self-pay | Admitting: *Deleted

## 2016-09-13 ENCOUNTER — Ambulatory Visit (HOSPITAL_BASED_OUTPATIENT_CLINIC_OR_DEPARTMENT_OTHER): Payer: Medicare Other

## 2016-09-13 ENCOUNTER — Other Ambulatory Visit (HOSPITAL_BASED_OUTPATIENT_CLINIC_OR_DEPARTMENT_OTHER): Payer: Medicare Other

## 2016-09-13 ENCOUNTER — Encounter: Payer: Self-pay | Admitting: Internal Medicine

## 2016-09-13 VITALS — HR 98

## 2016-09-13 DIAGNOSIS — C3491 Malignant neoplasm of unspecified part of right bronchus or lung: Secondary | ICD-10-CM

## 2016-09-13 DIAGNOSIS — C7889 Secondary malignant neoplasm of other digestive organs: Secondary | ICD-10-CM

## 2016-09-13 DIAGNOSIS — G893 Neoplasm related pain (acute) (chronic): Secondary | ICD-10-CM

## 2016-09-13 DIAGNOSIS — C787 Secondary malignant neoplasm of liver and intrahepatic bile duct: Secondary | ICD-10-CM | POA: Diagnosis not present

## 2016-09-13 DIAGNOSIS — Z5112 Encounter for antineoplastic immunotherapy: Secondary | ICD-10-CM

## 2016-09-13 DIAGNOSIS — C61 Malignant neoplasm of prostate: Secondary | ICD-10-CM

## 2016-09-13 DIAGNOSIS — G8929 Other chronic pain: Secondary | ICD-10-CM

## 2016-09-13 DIAGNOSIS — M549 Dorsalgia, unspecified: Secondary | ICD-10-CM | POA: Diagnosis not present

## 2016-09-13 DIAGNOSIS — C3401 Malignant neoplasm of right main bronchus: Secondary | ICD-10-CM

## 2016-09-13 DIAGNOSIS — R000 Tachycardia, unspecified: Secondary | ICD-10-CM | POA: Diagnosis not present

## 2016-09-13 DIAGNOSIS — C7951 Secondary malignant neoplasm of bone: Secondary | ICD-10-CM | POA: Diagnosis not present

## 2016-09-13 LAB — CBC WITH DIFFERENTIAL/PLATELET
BASO%: 0.4 % (ref 0.0–2.0)
Basophils Absolute: 0.1 10*3/uL (ref 0.0–0.1)
EOS ABS: 0.8 10*3/uL — AB (ref 0.0–0.5)
EOS%: 6.6 % (ref 0.0–7.0)
HEMATOCRIT: 37 % — AB (ref 38.4–49.9)
HEMOGLOBIN: 12.3 g/dL — AB (ref 13.0–17.1)
LYMPH#: 3.1 10*3/uL (ref 0.9–3.3)
LYMPH%: 26.2 % (ref 14.0–49.0)
MCH: 30.1 pg (ref 27.2–33.4)
MCHC: 33.2 g/dL (ref 32.0–36.0)
MCV: 90.5 fL (ref 79.3–98.0)
MONO#: 0.8 10*3/uL (ref 0.1–0.9)
MONO%: 6.4 % (ref 0.0–14.0)
NEUT%: 60.4 % (ref 39.0–75.0)
NEUTROS ABS: 7.1 10*3/uL — AB (ref 1.5–6.5)
PLATELETS: 305 10*3/uL (ref 140–400)
RBC: 4.09 10*6/uL — ABNORMAL LOW (ref 4.20–5.82)
RDW: 17.3 % — ABNORMAL HIGH (ref 11.0–14.6)
WBC: 11.7 10*3/uL — AB (ref 4.0–10.3)

## 2016-09-13 LAB — COMPREHENSIVE METABOLIC PANEL
ALK PHOS: 189 U/L — AB (ref 40–150)
ALT: 10 U/L (ref 0–55)
ANION GAP: 11 meq/L (ref 3–11)
AST: 12 U/L (ref 5–34)
Albumin: 2.8 g/dL — ABNORMAL LOW (ref 3.5–5.0)
BUN: 38.7 mg/dL — ABNORMAL HIGH (ref 7.0–26.0)
CALCIUM: 9.3 mg/dL (ref 8.4–10.4)
CO2: 15 meq/L — AB (ref 22–29)
CREATININE: 2.5 mg/dL — AB (ref 0.7–1.3)
Chloride: 113 mEq/L — ABNORMAL HIGH (ref 98–109)
EGFR: 31 mL/min/{1.73_m2} — AB (ref >90)
Glucose: 108 mg/dl (ref 70–140)
Potassium: 4.8 mEq/L (ref 3.5–5.1)
Sodium: 139 mEq/L (ref 136–145)
TOTAL PROTEIN: 7.6 g/dL (ref 6.4–8.3)

## 2016-09-13 MED ORDER — SODIUM CHLORIDE 0.9 % IV SOLN
240.0000 mg | Freq: Once | INTRAVENOUS | Status: AC
  Administered 2016-09-13: 240 mg via INTRAVENOUS
  Filled 2016-09-13: qty 4

## 2016-09-13 MED ORDER — OXYCODONE-ACETAMINOPHEN 5-325 MG PO TABS: 1.0000 | ORAL_TABLET | ORAL | 0 refills | Status: DC | PRN

## 2016-09-13 MED ORDER — SODIUM CHLORIDE 0.9 % IV SOLN
Freq: Once | INTRAVENOUS | Status: AC
  Administered 2016-09-13: 11:00:00 via INTRAVENOUS

## 2016-09-13 MED ORDER — SODIUM CHLORIDE 0.9 % IJ SOLN
10.0000 mL | INTRAMUSCULAR | Status: DC | PRN
  Administered 2016-09-13: 10 mL
  Filled 2016-09-13: qty 10

## 2016-09-13 MED ORDER — HEPARIN SOD (PORK) LOCK FLUSH 100 UNIT/ML IV SOLN
500.0000 [IU] | Freq: Once | INTRAVENOUS | Status: AC | PRN
  Administered 2016-09-13: 500 [IU]
  Filled 2016-09-13: qty 5

## 2016-09-13 NOTE — Progress Notes (Signed)
[place] Telephone:(336) [phone]   Fax:(336) [phone]  OFFICE PROGRESS NOTE  [name], [name], MD [place] [address]  DIAGNOSIS:  1) Stage IV (T2b, N2, M1b) non-small cell lung cancer, adenocarcinoma with negative EGFR mutation and negative gene translocation presented with a large right hilar mass as well as mediastinal lymphadenopathy and metastatic disease to the liver, bone and pancreas diagnosed in February 2016. 2) prostate adenocarcinoma diagnosed at [facility] with Gleason score 9 (4+5).  PRIOR THERAPY: Systemic chemotherapy with carboplatin for AUC of 5 and Alimta 500 MG/M2 every 3 weeks. Status post 6 cycles.  CURRENT THERAPY:  1) Immunotherapy with Nivolumab 240 MG every 2 weeks, status post 36 cycles. 2) Enzalutamide for prostate cancer at [place]: Mert Dietrick 56 y.o. male returns to the clinic today for follow-up visit accompanied by his brother-in-law. The patient is feeling fine today with no specific complaints except for itching in the lower back. This is most likely secondary to dry skin. He also has occasional nasal bleeding.  He denied having any chest pain, shortness of breath, cough or hemoptysis. He has no nausea, vomiting, diarrhea or constipation. He is tolerating his current treatment with Nivolumab fairly well status post 36 cycles. He is here today for evaluation before starting the next cycle of his treatment.  MEDICAL HISTORY: Past Medical History:  Diagnosis Date  . Chronic fatigue 04/12/2016  . Chronic pain 04/12/2016  . Shortness of breath dyspnea     ALLERGIES:  has No Known Allergies.  MEDICATIONS:  Current Outpatient Prescriptions  Medication Sig Dispense Refill  . acetaminophen (TYLENOL) 325 MG tablet Take 2 tablets (650 mg total) by mouth every 6 (six) hours as needed for mild pain (or Fever >/= 101).    Marland Kitchen albuterol (PROVENTIL) (2.5 MG/3ML) 0.083% nebulizer  solution Take 3 mLs (2.5 mg total) by nebulization every 2 (two) hours as needed for wheezing. 75 mL 12  . AMITIZA 24 MCG capsule     . bicalutamide (CASODEX) 50 MG tablet Take 50 mg by mouth.    . diphenhydrAMINE (BENADRYL) 25 MG tablet Take 25 mg by mouth.    . feeding supplement, ENSURE COMPLETE, (ENSURE COMPLETE) LIQD Take 237 mLs by mouth 3 (three) times daily between meals.    Marland Kitchen FLUOCINOLONE ACETONIDE BODY 0.01 % OIL     . levocetirizine (XYZAL) 5 MG tablet Take 5 mg by mouth.    . lidocaine-prilocaine (EMLA) cream Apply 1 application topically as needed. Apply to port site prior to chemotherapy. 30 g 0  . mirtazapine (REMERON) 15 MG tablet Take 1 tablet (15 mg total) by mouth at bedtime.    Marland Kitchen oxyCODONE-acetaminophen (PERCOCET/ROXICET) 5-325 MG tablet Take 1 tablet by mouth every 4 (four) hours as needed for severe pain. 60 tablet 0  . polyethylene glycol (MIRALAX / GLYCOLAX) packet Take 17 g by mouth daily.    . prochlorperazine (COMPAZINE) 10 MG tablet Take 1 tablet (10 mg total) by mouth every 6 (six) hours as needed for nausea or vomiting. 30 tablet 1  . senna-docusate (SENOKOT-S) 8.6-50 MG per tablet Take 1 tablet by mouth daily.    . SUMAtriptan (IMITREX) 25 MG tablet     . tamsulosin (FLOMAX) 0.4 MG CAPS capsule     . triamcinolone ointment (KENALOG) 0.1 % Apply to arms twice a day until clear, then stop.    [name] 40 MG capsule  No current facility-administered medications for this visit.     SURGICAL HISTORY:  Past Surgical History:  Procedure Laterality Date  . APPENDECTOMY    . VIDEO BRONCHOSCOPY N/A 09/21/2014   Procedure: VIDEO BRONCHOSCOPY WITH FLUORO;  Surgeon: Rigoberto Noel, MD;  Location: [place];  Service: Cardiopulmonary;  Laterality: N/A;  . VIDEO BRONCHOSCOPY WITH ENDOBRONCHIAL ULTRASOUND N/A 09/23/2014   Procedure: VIDEO BRONCHOSCOPY WITH ENDOBRONCHIAL ULTRASOUND;  Surgeon: Rigoberto Noel, MD;  Location: [place];  Service: Pulmonary;  Laterality: N/A;     REVIEW OF SYSTEMS:  A comprehensive review of systems was negative except for: Ears, nose, mouth, throat, and face: positive for epistaxis Integument/breast: positive for dryness and pruritus Musculoskeletal: positive for back pain   PHYSICAL EXAMINATION: General appearance: alert, cooperative and no distress Head: Normocephalic, without obvious abnormality, atraumatic Neck: no adenopathy, no JVD, supple, symmetrical, trachea midline and thyroid not enlarged, symmetric, no tenderness/mass/nodules Lymph nodes: Cervical, supraclavicular, and axillary nodes normal. Resp: clear to auscultation bilaterally Back: symmetric, no curvature. ROM normal. No CVA tenderness. Cardio: regular rate and rhythm, S1, S2 normal, no murmur, click, rub or gallop GI: soft, non-tender; bowel sounds normal; no masses,  no organomegaly Extremities: extremities normal, atraumatic, no cyanosis or edema  ECOG PERFORMANCE STATUS: 1 - Symptomatic but completely ambulatory  Blood pressure 102/79, pulse (!) 110, temperature 97.7 F (36.5 C), temperature source Oral, resp. rate 18, height 5' 9"  (1.753 m), weight 159 lb 14.4 oz (72.5 kg), SpO2 96 %.  LABORATORY DATA: Lab Results  Component Value Date   WBC 11.7 (H) 09/13/2016   HGB 12.3 (L) 09/13/2016   HCT 37.0 (L) 09/13/2016   MCV 90.5 09/13/2016   PLT 305 09/13/2016      Chemistry      Component Value Date/Time   NA 139 09/13/2016 0927   K 4.8 09/13/2016 0927   CL 108 10/25/2014 0845   CO2 15 (L) 09/13/2016 0927   BUN 38.7 (H) 09/13/2016 0927   CREATININE 2.5 (H) 09/13/2016 0927      Component Value Date/Time   CALCIUM 9.3 09/13/2016 0927   ALKPHOS 189 (H) 09/13/2016 0927   AST 12 09/13/2016 0927   ALT 10 09/13/2016 0927   BILITOT <0.22 09/13/2016 0927       RADIOGRAPHIC STUDIES: No results found. ASSESSMENT AND PLAN:   this is a very pleasant 56 years old African-American male with metastatic non-small cell lung cancer, adenocarcinoma. He  is currently undergoing treatment with Nivolumab status post 36 cycles.  The patient is rating his immunotherapy fairly well with no significant adverse effect except for dry skin and pruritus.  I recommended for him to continue with cycle #37 today as scheduled.  I will see the patient back for follow-up visit in 2 weeks for evaluation before starting the next cycle of his treatment.  For the chronic back pain, he was given a refill for Percocet.  for the tachycardia, the patient was encouraged to increase his by mouth intake. We'll check his TSH at regular basis.  He was advised to call immediately if he has any concerning symptoms in the interval. The patient voices understanding of current disease status and treatment options and is in agreement with the current care plan. All questions were answered. The patient knows to call the clinic with any problems, questions or concerns. We can certainly see the patient much sooner if necessary. I spent 10 minutes counseling the patient face to face. The total time spent in the appointment  was 15 minutes. Disclaimer: This note was dictated with voice recognition software. Similar sounding words can inadvertently be transcribed and may not be corrected upon review.

## 2016-09-13 NOTE — Telephone Encounter (Signed)
Per 1/25 LOS I have scheduled appts and gave calendar

## 2016-09-13 NOTE — Telephone Encounter (Signed)
Appointments scheduled per 1/25 LOS. Patient given AVS report and calendars with future scheduled appointments. Patient requested to have appointments be scheduled on Wednesday's instead of Thursday's from now on. Okay per desk nurse.

## 2016-09-13 NOTE — Progress Notes (Signed)
09/13/2016 Ok to treat with CBC/CMP results today per Dr. Julien Nordmann

## 2016-09-13 NOTE — Progress Notes (Signed)
Cbc and CMET reviewed by MD, ok to treat with Creatinine 2.5

## 2016-09-13 NOTE — Patient Instructions (Signed)
[facility] Discharge Instructions for Patients Receiving Chemotherapy  Today you received the following chemotherapy agents:  Nivolumab.  To help prevent nausea and vomiting after your treatment, we encourage you to take your nausea medication as directed.   If you develop nausea and vomiting that is not controlled by your nausea medication, call the clinic.   BELOW ARE SYMPTOMS THAT SHOULD BE REPORTED IMMEDIATELY:  *FEVER GREATER THAN 100.5 F  *CHILLS WITH OR WITHOUT FEVER  NAUSEA AND VOMITING THAT IS NOT CONTROLLED WITH YOUR NAUSEA MEDICATION  *UNUSUAL SHORTNESS OF BREATH  *UNUSUAL BRUISING OR BLEEDING  TENDERNESS IN MOUTH AND THROAT WITH OR WITHOUT PRESENCE OF ULCERS  *URINARY PROBLEMS  *BOWEL PROBLEMS  UNUSUAL RASH Items with * indicate a potential emergency and should be followed up as soon as possible.  Feel free to call the clinic you have any questions or concerns. The clinic phone number is [phone].  Please show the CHEMO ALERT CARD at check-in to the Emergency Department and triage nurse.   

## 2016-09-27 ENCOUNTER — Ambulatory Visit (HOSPITAL_BASED_OUTPATIENT_CLINIC_OR_DEPARTMENT_OTHER): Payer: Medicare Other

## 2016-09-27 ENCOUNTER — Ambulatory Visit (HOSPITAL_BASED_OUTPATIENT_CLINIC_OR_DEPARTMENT_OTHER): Payer: Medicare Other | Admitting: Internal Medicine

## 2016-09-27 ENCOUNTER — Encounter: Payer: Self-pay | Admitting: Internal Medicine

## 2016-09-27 ENCOUNTER — Other Ambulatory Visit (HOSPITAL_BASED_OUTPATIENT_CLINIC_OR_DEPARTMENT_OTHER): Payer: Medicare Other

## 2016-09-27 DIAGNOSIS — C7951 Secondary malignant neoplasm of bone: Secondary | ICD-10-CM

## 2016-09-27 DIAGNOSIS — G893 Neoplasm related pain (acute) (chronic): Secondary | ICD-10-CM

## 2016-09-27 DIAGNOSIS — C801 Malignant (primary) neoplasm, unspecified: Principal | ICD-10-CM

## 2016-09-27 DIAGNOSIS — C7889 Secondary malignant neoplasm of other digestive organs: Secondary | ICD-10-CM | POA: Diagnosis not present

## 2016-09-27 DIAGNOSIS — Z5112 Encounter for antineoplastic immunotherapy: Secondary | ICD-10-CM

## 2016-09-27 DIAGNOSIS — C3401 Malignant neoplasm of right main bronchus: Secondary | ICD-10-CM

## 2016-09-27 DIAGNOSIS — Z79899 Other long term (current) drug therapy: Secondary | ICD-10-CM | POA: Diagnosis not present

## 2016-09-27 DIAGNOSIS — C3491 Malignant neoplasm of unspecified part of right bronchus or lung: Secondary | ICD-10-CM

## 2016-09-27 DIAGNOSIS — G8929 Other chronic pain: Secondary | ICD-10-CM | POA: Diagnosis not present

## 2016-09-27 DIAGNOSIS — C787 Secondary malignant neoplasm of liver and intrahepatic bile duct: Secondary | ICD-10-CM

## 2016-09-27 DIAGNOSIS — M549 Dorsalgia, unspecified: Secondary | ICD-10-CM | POA: Diagnosis not present

## 2016-09-27 DIAGNOSIS — R5382 Chronic fatigue, unspecified: Secondary | ICD-10-CM

## 2016-09-27 LAB — CBC WITH DIFFERENTIAL/PLATELET
BASO%: 1.1 % (ref 0.0–2.0)
BASOS ABS: 0.1 10*3/uL (ref 0.0–0.1)
EOS ABS: 0.9 10*3/uL — AB (ref 0.0–0.5)
EOS%: 7 % (ref 0.0–7.0)
HCT: 37.5 % — ABNORMAL LOW (ref 38.4–49.9)
HGB: 12.3 g/dL — ABNORMAL LOW (ref 13.0–17.1)
LYMPH%: 18.1 % (ref 14.0–49.0)
MCH: 30.8 pg (ref 27.2–33.4)
MCHC: 32.8 g/dL (ref 32.0–36.0)
MCV: 94 fL (ref 79.3–98.0)
MONO#: 1.3 10*3/uL — ABNORMAL HIGH (ref 0.1–0.9)
MONO%: 10.3 % (ref 0.0–14.0)
NEUT#: 8.1 10*3/uL — ABNORMAL HIGH (ref 1.5–6.5)
NEUT%: 63.5 % (ref 39.0–75.0)
Platelets: 316 10*3/uL (ref 140–400)
RBC: 3.99 10*6/uL — AB (ref 4.20–5.82)
RDW: 19.3 % — ABNORMAL HIGH (ref 11.0–14.6)
WBC: 12.7 10*3/uL — AB (ref 4.0–10.3)
lymph#: 2.3 10*3/uL (ref 0.9–3.3)

## 2016-09-27 LAB — COMPREHENSIVE METABOLIC PANEL
ALT: 11 U/L (ref 0–55)
AST: 15 U/L (ref 5–34)
Albumin: 2.9 g/dL — ABNORMAL LOW (ref 3.5–5.0)
Alkaline Phosphatase: 197 U/L — ABNORMAL HIGH (ref 40–150)
Anion Gap: 9 mEq/L (ref 3–11)
BUN: 43.9 mg/dL — ABNORMAL HIGH (ref 7.0–26.0)
CHLORIDE: 114 meq/L — AB (ref 98–109)
CO2: 17 meq/L — AB (ref 22–29)
Calcium: 9.2 mg/dL (ref 8.4–10.4)
Creatinine: 2.3 mg/dL — ABNORMAL HIGH (ref 0.7–1.3)
EGFR: 35 mL/min/{1.73_m2} — AB (ref >90)
GLUCOSE: 88 mg/dL (ref 70–140)
POTASSIUM: 5.2 meq/L — AB (ref 3.5–5.1)
SODIUM: 140 meq/L (ref 136–145)
Total Bilirubin: 0.29 mg/dL (ref 0.20–1.20)
Total Protein: 7.5 g/dL (ref 6.4–8.3)

## 2016-09-27 LAB — TSH: TSH: 3.393 m(IU)/L (ref 0.320–4.118)

## 2016-09-27 MED ORDER — HEPARIN SOD (PORK) LOCK FLUSH 100 UNIT/ML IV SOLN
500.0000 [IU] | Freq: Once | INTRAVENOUS | Status: DC | PRN
  Filled 2016-09-27: qty 5

## 2016-09-27 MED ORDER — SODIUM CHLORIDE 0.9 % IV SOLN
240.0000 mg | Freq: Once | INTRAVENOUS | Status: AC
  Administered 2016-09-27: 240 mg via INTRAVENOUS
  Filled 2016-09-27: qty 20

## 2016-09-27 MED ORDER — SODIUM CHLORIDE 0.9 % IV SOLN
Freq: Once | INTRAVENOUS | Status: AC
  Administered 2016-09-27: 14:00:00 via INTRAVENOUS

## 2016-09-27 MED ORDER — OXYCODONE-ACETAMINOPHEN 5-325 MG PO TABS: 1.0000 | ORAL_TABLET | ORAL | 0 refills | Status: DC | PRN

## 2016-09-27 MED ORDER — SODIUM CHLORIDE 0.9 % IJ SOLN
10.0000 mL | INTRAMUSCULAR | Status: DC | PRN
  Filled 2016-09-27: qty 10

## 2016-09-27 NOTE — Progress Notes (Signed)
[place] [phone]   [phone]  OFFICE PROGRESS NOTE  [name], [name], MD [place] [address]  DIAGNOSIS:  1) Stage IV (T2b, N2, M1b) non-small cell lung cancer, adenocarcinoma with negative EGFR mutation and negative gene translocation presented with a large right hilar mass as well as mediastinal lymphadenopathy and metastatic disease to the liver, bone and pancreas diagnosed in February 2016. 2) prostate adenocarcinoma diagnosed at [facility] with Gleason score 9 (4+5).  PRIOR THERAPY: Systemic chemotherapy with carboplatin for AUC of 5 and Alimta 500 MG/M2 every 3 weeks. Status post 6 cycles.  CURRENT THERAPY:  1) Immunotherapy with Nivolumab 240 MG every 2 weeks, status post 37 cycles. 2) Enzalutamide for prostate cancer at [place]: Francisco Love 56 y.o. male came to the clinic today for follow-up visit accompanied by his brother-in-law. The patient is feeling fine today with no specific complaints except for the chronic back pain. He is tolerating his current treatment with immunotherapy with Nivolumab fairly well. He denied having any significant skin rash or diarrhea. He has no nausea, vomiting, diarrhea or constipation. He has no fever or chills. The patient denied having any significant chest pain but has shortness breath with exertion with no cough or hemoptysis. He is here today for evaluation before starting cycle #38.  MEDICAL HISTORY: Past Medical History:  Diagnosis Date  . Chronic fatigue 04/12/2016  . Chronic pain 04/12/2016  . Shortness of breath dyspnea     ALLERGIES:  has No Known Allergies.  MEDICATIONS:  Current Outpatient Prescriptions  Medication Sig Dispense Refill  . acetaminophen (TYLENOL) 325 MG tablet Take 2 tablets (650 mg total) by mouth every 6 (six) hours as needed for mild pain (or Fever >/= 101).    Marland Kitchen albuterol (PROVENTIL) (2.5 MG/3ML) 0.083%  nebulizer solution Take 3 mLs (2.5 mg total) by nebulization every 2 (two) hours as needed for wheezing. 75 mL 12  . AMITIZA 24 MCG capsule     . bicalutamide (CASODEX) 50 MG tablet Take 50 mg by mouth.    . diphenhydrAMINE (BENADRYL) 25 MG tablet Take 25 mg by mouth.    . feeding supplement, ENSURE COMPLETE, (ENSURE COMPLETE) LIQD Take 237 mLs by mouth 3 (three) times daily between meals.    Marland Kitchen FLUOCINOLONE ACETONIDE BODY 0.01 % OIL     . levocetirizine (XYZAL) 5 MG tablet Take 5 mg by mouth.    . lidocaine-prilocaine (EMLA) cream Apply 1 application topically as needed. Apply to port site prior to chemotherapy. 30 g 0  . mirtazapine (REMERON) 15 MG tablet Take 1 tablet (15 mg total) by mouth at bedtime.    Marland Kitchen oxyCODONE-acetaminophen (PERCOCET/ROXICET) 5-325 MG tablet Take 1 tablet by mouth every 4 (four) hours as needed for severe pain. 60 tablet 0  . polyethylene glycol (MIRALAX / GLYCOLAX) packet Take 17 g by mouth daily.    . prochlorperazine (COMPAZINE) 10 MG tablet Take 1 tablet (10 mg total) by mouth every 6 (six) hours as needed for nausea or vomiting. 30 tablet 1  . senna-docusate (SENOKOT-S) 8.6-50 MG per tablet Take 1 tablet by mouth daily.    . SUMAtriptan (IMITREX) 25 MG tablet     . tamsulosin (FLOMAX) 0.4 MG CAPS capsule     . triamcinolone ointment (KENALOG) 0.1 % Apply to arms twice a day until clear, then stop.    [name] 40 MG capsule  No current facility-administered medications for this visit.     SURGICAL HISTORY:  Past Surgical History:  Procedure Laterality Date  . APPENDECTOMY    . VIDEO BRONCHOSCOPY N/A 09/21/2014   Procedure: VIDEO BRONCHOSCOPY WITH FLUORO;  Surgeon: Rigoberto Noel, MD;  Location: [facility];  Service: Cardiopulmonary;  Laterality: N/A;  . VIDEO BRONCHOSCOPY WITH ENDOBRONCHIAL ULTRASOUND N/A 09/23/2014   Procedure: VIDEO BRONCHOSCOPY WITH ENDOBRONCHIAL ULTRASOUND;  Surgeon: Rigoberto Noel, MD;  Location: [facility];  Service: Pulmonary;  Laterality:  N/A;    REVIEW OF SYSTEMS:  A comprehensive review of systems was negative except for: Musculoskeletal: positive for back pain   PHYSICAL EXAMINATION: General appearance: alert, cooperative and no distress Head: Normocephalic, without obvious abnormality, atraumatic Neck: no adenopathy, no JVD, supple, symmetrical, trachea midline and thyroid not enlarged, symmetric, no tenderness/mass/nodules Lymph nodes: Cervical, supraclavicular, and axillary nodes normal. Resp: clear to auscultation bilaterally Back: symmetric, no curvature. ROM normal. No CVA tenderness. Cardio: regular rate and rhythm, S1, S2 normal, no murmur, click, rub or gallop GI: soft, non-tender; bowel sounds normal; no masses,  no organomegaly Extremities: extremities normal, atraumatic, no cyanosis or edema  ECOG PERFORMANCE STATUS: 1 - Symptomatic but completely ambulatory  Blood pressure 108/84, pulse (!) 103, temperature 97.5 F (36.4 C), temperature source Oral, resp. rate 18, height 5' 9"  (1.753 m), weight 161 lb 11.2 oz (73.3 kg), SpO2 96 %.  LABORATORY DATA: Lab Results  Component Value Date   WBC 12.7 (H) 09/27/2016   HGB 12.3 (L) 09/27/2016   HCT 37.5 (L) 09/27/2016   MCV 94.0 09/27/2016   PLT 316 09/27/2016      Chemistry      Component Value Date/Time   NA 139 09/13/2016 0927   K 4.8 09/13/2016 0927   CL 108 10/25/2014 0845   CO2 15 (L) 09/13/2016 0927   BUN 38.7 (H) 09/13/2016 0927   CREATININE 2.5 (H) 09/13/2016 0927      Component Value Date/Time   CALCIUM 9.3 09/13/2016 0927   ALKPHOS 189 (H) 09/13/2016 0927   AST 12 09/13/2016 0927   ALT 10 09/13/2016 0927   BILITOT <0.22 09/13/2016 0927       RADIOGRAPHIC STUDIES: No results found. ASSESSMENT AND PLAN:  This is a very pleasant 56 years old African-American male with metastatic non-small cell lung cancer, adenocarcinoma currently undergoing immunotherapy with Nivolumab status post 37 cycles. The patient has no complaints today. He  is tolerating his treatment well. I recommended for him to proceed with cycle #38 today as a scheduled. He would come back for follow-up visit in 2 weeks for evaluation after repeating CT scan of the chest, abdomen and pelvis for restaging of his disease. For the chronic back pain, he was given a refill of Percocet today. The patient was advised to call immediately if he has any concerning symptoms in the interval.  The patient voices understanding of current disease status and treatment options and is in agreement with the current care plan. All questions were answered. The patient knows to call the clinic with any problems, questions or concerns. We can certainly see the patient much sooner if necessary. I spent 10 minutes counseling the patient face to face. The total time spent in the appointment was 15 minutes.  Disclaimer: This note was dictated with voice recognition software. Similar sounding words can inadvertently be transcribed and may not be corrected upon review.

## 2016-09-27 NOTE — Patient Instructions (Signed)
Cancer Center Discharge Instructions for Patients Receiving Chemotherapy  Today you received the following chemotherapy agents:  Nivolumab.  To help prevent nausea and vomiting after your treatment, we encourage you to take your nausea medication as directed.   If you develop nausea and vomiting that is not controlled by your nausea medication, call the clinic.   BELOW ARE SYMPTOMS THAT SHOULD BE REPORTED IMMEDIATELY:  *FEVER GREATER THAN 100.5 F  *CHILLS WITH OR WITHOUT FEVER  NAUSEA AND VOMITING THAT IS NOT CONTROLLED WITH YOUR NAUSEA MEDICATION  *UNUSUAL SHORTNESS OF BREATH  *UNUSUAL BRUISING OR BLEEDING  TENDERNESS IN MOUTH AND THROAT WITH OR WITHOUT PRESENCE OF ULCERS  *URINARY PROBLEMS  *BOWEL PROBLEMS  UNUSUAL RASH Items with * indicate a potential emergency and should be followed up as soon as possible.  Feel free to call the clinic you have any questions or concerns. The clinic phone number is [phone].  Please show the CHEMO ALERT CARD at check-in to the Emergency Department and triage nurse.   

## 2016-10-10 ENCOUNTER — Telehealth: Payer: Self-pay | Admitting: Medical Oncology

## 2016-10-10 ENCOUNTER — Other Ambulatory Visit: Payer: Self-pay | Admitting: Medical Oncology

## 2016-10-10 ENCOUNTER — Ambulatory Visit (HOSPITAL_BASED_OUTPATIENT_CLINIC_OR_DEPARTMENT_OTHER): Payer: Medicare Other | Admitting: Internal Medicine

## 2016-10-10 ENCOUNTER — Ambulatory Visit (HOSPITAL_BASED_OUTPATIENT_CLINIC_OR_DEPARTMENT_OTHER): Payer: Medicare Other

## 2016-10-10 ENCOUNTER — Other Ambulatory Visit (HOSPITAL_BASED_OUTPATIENT_CLINIC_OR_DEPARTMENT_OTHER): Payer: Medicare Other

## 2016-10-10 ENCOUNTER — Telehealth: Payer: Self-pay | Admitting: Internal Medicine

## 2016-10-10 ENCOUNTER — Encounter: Payer: Self-pay | Admitting: Internal Medicine

## 2016-10-10 VITALS — HR 112

## 2016-10-10 DIAGNOSIS — C61 Malignant neoplasm of prostate: Secondary | ICD-10-CM

## 2016-10-10 DIAGNOSIS — C3491 Malignant neoplasm of unspecified part of right bronchus or lung: Secondary | ICD-10-CM

## 2016-10-10 DIAGNOSIS — G893 Neoplasm related pain (acute) (chronic): Secondary | ICD-10-CM

## 2016-10-10 DIAGNOSIS — C3401 Malignant neoplasm of right main bronchus: Secondary | ICD-10-CM

## 2016-10-10 DIAGNOSIS — Z5112 Encounter for antineoplastic immunotherapy: Secondary | ICD-10-CM

## 2016-10-10 DIAGNOSIS — C7951 Secondary malignant neoplasm of bone: Secondary | ICD-10-CM

## 2016-10-10 DIAGNOSIS — C7889 Secondary malignant neoplasm of other digestive organs: Secondary | ICD-10-CM | POA: Diagnosis not present

## 2016-10-10 DIAGNOSIS — C787 Secondary malignant neoplasm of liver and intrahepatic bile duct: Secondary | ICD-10-CM

## 2016-10-10 LAB — COMPREHENSIVE METABOLIC PANEL
ALT: 9 U/L (ref 0–55)
AST: 11 U/L (ref 5–34)
Albumin: 2.8 g/dL — ABNORMAL LOW (ref 3.5–5.0)
Alkaline Phosphatase: 201 U/L — ABNORMAL HIGH (ref 40–150)
Anion Gap: 9 mEq/L (ref 3–11)
BILIRUBIN TOTAL: 0.26 mg/dL (ref 0.20–1.20)
BUN: 43.1 mg/dL — AB (ref 7.0–26.0)
CHLORIDE: 115 meq/L — AB (ref 98–109)
CO2: 15 meq/L — AB (ref 22–29)
CREATININE: 2.2 mg/dL — AB (ref 0.7–1.3)
Calcium: 9.3 mg/dL (ref 8.4–10.4)
EGFR: 37 mL/min/{1.73_m2} — ABNORMAL LOW (ref >90)
GLUCOSE: 88 mg/dL (ref 70–140)
Potassium: 5.1 mEq/L (ref 3.5–5.1)
SODIUM: 139 meq/L (ref 136–145)
TOTAL PROTEIN: 7.5 g/dL (ref 6.4–8.3)

## 2016-10-10 LAB — CBC WITH DIFFERENTIAL/PLATELET
BASO%: 0.8 % (ref 0.0–2.0)
Basophils Absolute: 0.1 10*3/uL (ref 0.0–0.1)
EOS%: 5.1 % (ref 0.0–7.0)
Eosinophils Absolute: 0.6 10*3/uL — ABNORMAL HIGH (ref 0.0–0.5)
HCT: 36.9 % — ABNORMAL LOW (ref 38.4–49.9)
HGB: 12.2 g/dL — ABNORMAL LOW (ref 13.0–17.1)
LYMPH#: 2.4 10*3/uL (ref 0.9–3.3)
LYMPH%: 18.8 % (ref 14.0–49.0)
MCH: 31.1 pg (ref 27.2–33.4)
MCHC: 33 g/dL (ref 32.0–36.0)
MCV: 94 fL (ref 79.3–98.0)
MONO#: 1.1 10*3/uL — AB (ref 0.1–0.9)
MONO%: 8.7 % (ref 0.0–14.0)
NEUT%: 66.6 % (ref 39.0–75.0)
NEUTROS ABS: 8.5 10*3/uL — AB (ref 1.5–6.5)
Platelets: 330 10*3/uL (ref 140–400)
RBC: 3.93 10*6/uL — ABNORMAL LOW (ref 4.20–5.82)
RDW: 19.1 % — ABNORMAL HIGH (ref 11.0–14.6)
WBC: 12.7 10*3/uL — AB (ref 4.0–10.3)

## 2016-10-10 MED ORDER — OXYCODONE-ACETAMINOPHEN 5-325 MG PO TABS: 1.0000 | ORAL_TABLET | ORAL | 0 refills | Status: DC | PRN

## 2016-10-10 MED ORDER — SODIUM CHLORIDE 0.9 % IJ SOLN
10.0000 mL | INTRAMUSCULAR | Status: DC | PRN
  Administered 2016-10-10: 10 mL
  Filled 2016-10-10: qty 10

## 2016-10-10 MED ORDER — SODIUM CHLORIDE 0.9 % IV SOLN
240.0000 mg | Freq: Once | INTRAVENOUS | Status: AC
  Administered 2016-10-10: 240 mg via INTRAVENOUS
  Filled 2016-10-10: qty 20

## 2016-10-10 MED ORDER — HEPARIN SOD (PORK) LOCK FLUSH 100 UNIT/ML IV SOLN
500.0000 [IU] | Freq: Once | INTRAVENOUS | Status: AC | PRN
  Administered 2016-10-10: 500 [IU]
  Filled 2016-10-10: qty 5

## 2016-10-10 MED ORDER — SODIUM CHLORIDE 0.9 % IV SOLN
Freq: Once | INTRAVENOUS | Status: AC
  Administered 2016-10-10: 14:00:00 via INTRAVENOUS

## 2016-10-10 NOTE — Progress Notes (Signed)
[name] [phone]   [phone]  OFFICE PROGRESS NOTE  [name], [name], MD [place] [address]  DIAGNOSIS:  1) Stage IV (T2b, N2, M1b) non-small cell lung cancer, adenocarcinoma with negative EGFR mutation and negative gene translocation presented with a large right hilar mass as well as mediastinal lymphadenopathy and metastatic disease to the liver, bone and pancreas diagnosed in February 2016. 2) prostate adenocarcinoma diagnosed at [facility] with Gleason score 9 (4+5).  PRIOR THERAPY: Systemic chemotherapy with carboplatin for AUC of 5 and Alimta 500 MG/M2 every 3 weeks. Status post 6 cycles.  CURRENT THERAPY:  1) Immunotherapy with Nivolumab 240 MG every 2 weeks, status post 38 cycles. 2) Enzalutamide for prostate cancer at [facility]: Francisco Love 56 y.o. male came to the clinic today for follow-up visit accompanied by his brother-in-law. The patient is feeling fine today with no specific complaints except for the chronic low back pain. He also has shortness breath with exertion and tachycardia. He denied having any recent weight loss or night sweats. He has no fever or chills. He denied having any chest pain, cough or hemoptysis. He has no significant nausea, vomiting, diarrhea or constipation. He is here today for evaluation before starting cycle #39 of his treatment with immunotherapy with Nivolumab.  MEDICAL HISTORY: Past Medical History:  Diagnosis Date  . Chronic fatigue 04/12/2016  . Chronic pain 04/12/2016  . Shortness of breath dyspnea     ALLERGIES:  has No Known Allergies.  MEDICATIONS:  Current Outpatient Prescriptions  Medication Sig Dispense Refill  . acetaminophen (TYLENOL) 325 MG tablet Take 2 tablets (650 mg total) by mouth every 6 (six) hours as needed for mild pain (or Fever >/= 101).    Marland Kitchen albuterol (PROVENTIL) (2.5 MG/3ML) 0.083% nebulizer solution Take 3  mLs (2.5 mg total) by nebulization every 2 (two) hours as needed for wheezing. 75 mL 12  . AMITIZA 24 MCG capsule     . bicalutamide (CASODEX) 50 MG tablet Take 50 mg by mouth.    . diphenhydrAMINE (BENADRYL) 25 MG tablet Take 25 mg by mouth.    . feeding supplement, ENSURE COMPLETE, (ENSURE COMPLETE) LIQD Take 237 mLs by mouth 3 (three) times daily between meals.    Marland Kitchen FLUOCINOLONE ACETONIDE BODY 0.01 % OIL     . levocetirizine (XYZAL) 5 MG tablet Take 5 mg by mouth.    . lidocaine-prilocaine (EMLA) cream Apply 1 application topically as needed. Apply to port site prior to chemotherapy. 30 g 0  . mirtazapine (REMERON) 15 MG tablet Take 1 tablet (15 mg total) by mouth at bedtime.    Marland Kitchen oxyCODONE-acetaminophen (PERCOCET/ROXICET) 5-325 MG tablet Take 1 tablet by mouth every 4 (four) hours as needed for severe pain. 60 tablet 0  . polyethylene glycol (MIRALAX / GLYCOLAX) packet Take 17 g by mouth daily.    . prochlorperazine (COMPAZINE) 10 MG tablet Take 1 tablet (10 mg total) by mouth every 6 (six) hours as needed for nausea or vomiting. 30 tablet 1  . senna-docusate (SENOKOT-S) 8.6-50 MG per tablet Take 1 tablet by mouth daily.    . SUMAtriptan (IMITREX) 25 MG tablet     . tamsulosin (FLOMAX) 0.4 MG CAPS capsule     . triamcinolone ointment (KENALOG) 0.1 % Apply to arms twice a day until clear, then stop.    . valACYclovir (VALTREX) 1000 MG tablet Take 1,000 mg by  mouth.    Francisco Love 40 MG capsule      No current facility-administered medications for this visit.     SURGICAL HISTORY:  Past Surgical History:  Procedure Laterality Date  . APPENDECTOMY    . VIDEO BRONCHOSCOPY N/A 09/21/2014   Procedure: VIDEO BRONCHOSCOPY WITH FLUORO;  Surgeon: Rigoberto Noel, MD;  Location: [facility];  Service: Cardiopulmonary;  Laterality: N/A;  . VIDEO BRONCHOSCOPY WITH ENDOBRONCHIAL ULTRASOUND N/A 09/23/2014   Procedure: VIDEO BRONCHOSCOPY WITH ENDOBRONCHIAL ULTRASOUND;  Surgeon: Rigoberto Noel, MD;  Location:  [facility];  Service: Pulmonary;  Laterality: N/A;    REVIEW OF SYSTEMS:  A comprehensive review of systems was negative except for: Musculoskeletal: positive for back pain   PHYSICAL EXAMINATION: General appearance: alert, cooperative and no distress Head: Normocephalic, without obvious abnormality, atraumatic Neck: no adenopathy, no JVD, supple, symmetrical, trachea midline and thyroid not enlarged, symmetric, no tenderness/mass/nodules Lymph nodes: Cervical, supraclavicular, and axillary nodes normal. Resp: clear to auscultation bilaterally Back: symmetric, no curvature. ROM normal. No CVA tenderness. Cardio: regular rate and rhythm, S1, S2 normal, no murmur, click, rub or gallop GI: soft, non-tender; bowel sounds normal; no masses,  no organomegaly Extremities: extremities normal, atraumatic, no cyanosis or edema  ECOG PERFORMANCE STATUS: 1 - Symptomatic but completely ambulatory  Blood pressure 99/84, pulse (!) 122, temperature 98.1 F (36.7 C), temperature source Oral, resp. rate 18, height '5\' 9"'$  (1.753 m), weight 159 lb 14.4 oz (72.5 kg), SpO2 95 %.  LABORATORY DATA: Lab Results  Component Value Date   WBC 12.7 (H) 10/10/2016   HGB 12.2 (L) 10/10/2016   HCT 36.9 (L) 10/10/2016   MCV 94.0 10/10/2016   PLT 330 10/10/2016      Chemistry      Component Value Date/Time   NA 139 10/10/2016 1151   K 5.1 10/10/2016 1151   CL 108 10/25/2014 0845   CO2 15 (L) 10/10/2016 1151   BUN 43.1 (H) 10/10/2016 1151   CREATININE 2.2 (H) 10/10/2016 1151      Component Value Date/Time   CALCIUM 9.3 10/10/2016 1151   ALKPHOS 201 (H) 10/10/2016 1151   AST 11 10/10/2016 1151   ALT 9 10/10/2016 1151   BILITOT 0.26 10/10/2016 1151       RADIOGRAPHIC STUDIES: No results found. ASSESSMENT AND PLAN:  This is a very pleasant 56 years old African-American male with metastatic non-small cell lung cancer, adenocarcinoma status post induction systemic chemotherapy and he is currently on second  line treatment with Nivolumab status post 38 cycles. She has been tolerating his treatment with immunotherapy fairly well with no significant adverse effects. I recommended for the patient to proceed with cycle #39 today as scheduled. He would come back for follow-up visit in 2 weeks for reevaluation with repeat CT scan of the chest, abdomen and pelvis for restaging of his disease. The patient was given a refill for Percocet today for the chronic back pain. He was advised to call immediately if he has any concerning symptoms in the interval. The patient voices understanding of current disease status and treatment options and is in agreement with the current care plan. All questions were answered. The patient knows to call the clinic with any problems, questions or concerns. We can certainly see the patient much sooner if necessary. I spent 10 minutes counseling the patient face to face. The total time spent in the appointment was 15 minutes.  Disclaimer: This note was dictated with voice recognition software. Similar sounding words  can inadvertently be transcribed and may not be corrected upon review.

## 2016-10-10 NOTE — Progress Notes (Signed)
Per Dr. Earlie Server, [place] to treat with pulse of 112 and Cr of 2.2.   Wylene Simmer, BSN, RN 10/10/2016 1:40 PM

## 2016-10-10 NOTE — Telephone Encounter (Signed)
Appointments scheduled per 2/21 LOS. Patient given AVS report and calendars with future scheduled appointments. Appointments for 3/8 rescheduled to 3/7... Okay per desk nurse.

## 2016-10-10 NOTE — Telephone Encounter (Signed)
Pt requests portable oxygen tank to take when he ambulates and goes to appts. Faxed order for protable oxygen and oxygen sat readings to [place].

## 2016-10-10 NOTE — Patient Instructions (Signed)
[facility] Discharge Instructions for Patients Receiving Chemotherapy  Today you received the following chemotherapy agents:  Nivolumab.  To help prevent nausea and vomiting after your treatment, we encourage you to take your nausea medication as directed.   If you develop nausea and vomiting that is not controlled by your nausea medication, call the clinic.   BELOW ARE SYMPTOMS THAT SHOULD BE REPORTED IMMEDIATELY:  *FEVER GREATER THAN 100.5 F  *CHILLS WITH OR WITHOUT FEVER  NAUSEA AND VOMITING THAT IS NOT CONTROLLED WITH YOUR NAUSEA MEDICATION  *UNUSUAL SHORTNESS OF BREATH  *UNUSUAL BRUISING OR BLEEDING  TENDERNESS IN MOUTH AND THROAT WITH OR WITHOUT PRESENCE OF ULCERS  *URINARY PROBLEMS  *BOWEL PROBLEMS  UNUSUAL RASH Items with * indicate a potential emergency and should be followed up as soon as possible.  Feel free to call the clinic you have any questions or concerns. The clinic phone number is [phone].  Please show the CHEMO ALERT CARD at check-in to the Emergency Department and triage nurse.   

## 2016-10-11 ENCOUNTER — Ambulatory Visit: Payer: Medicare Other

## 2016-10-11 ENCOUNTER — Telehealth: Payer: Self-pay | Admitting: *Deleted

## 2016-10-11 ENCOUNTER — Ambulatory Visit: Payer: Medicare Other | Admitting: Internal Medicine

## 2016-10-11 ENCOUNTER — Other Ambulatory Visit: Payer: Medicare Other

## 2016-10-11 NOTE — Telephone Encounter (Signed)
Per 2/21 LOS and staff message I have scheduled appts. Notified the scheduler

## 2016-10-24 ENCOUNTER — Ambulatory Visit (HOSPITAL_COMMUNITY)
Admission: RE | Admit: 2016-10-24 | Discharge: 2016-10-24 | Disposition: A | Discharge status: Home / Self Care | Payer: Medicare Other | Source: Ambulatory Visit | Attending: Internal Medicine | Admitting: Internal Medicine

## 2016-10-24 ENCOUNTER — Ambulatory Visit (HOSPITAL_BASED_OUTPATIENT_CLINIC_OR_DEPARTMENT_OTHER): Payer: Medicare Other | Admitting: Internal Medicine

## 2016-10-24 ENCOUNTER — Telehealth: Payer: Self-pay | Admitting: Internal Medicine

## 2016-10-24 ENCOUNTER — Encounter: Payer: Self-pay | Admitting: Internal Medicine

## 2016-10-24 ENCOUNTER — Ambulatory Visit (HOSPITAL_BASED_OUTPATIENT_CLINIC_OR_DEPARTMENT_OTHER): Payer: Medicare Other

## 2016-10-24 ENCOUNTER — Other Ambulatory Visit (HOSPITAL_BASED_OUTPATIENT_CLINIC_OR_DEPARTMENT_OTHER): Payer: Medicare Other

## 2016-10-24 ENCOUNTER — Encounter (HOSPITAL_COMMUNITY): Payer: Self-pay

## 2016-10-24 VITALS — BP 117/98 | HR 119 | Temp 97.5°F | Resp 18 | Wt 163.3 lb

## 2016-10-24 DIAGNOSIS — J439 Emphysema, unspecified: Secondary | ICD-10-CM | POA: Insufficient documentation

## 2016-10-24 DIAGNOSIS — G893 Neoplasm related pain (acute) (chronic): Secondary | ICD-10-CM

## 2016-10-24 DIAGNOSIS — M879 Osteonecrosis, unspecified: Secondary | ICD-10-CM | POA: Diagnosis not present

## 2016-10-24 DIAGNOSIS — Z79899 Other long term (current) drug therapy: Secondary | ICD-10-CM

## 2016-10-24 DIAGNOSIS — K861 Other chronic pancreatitis: Secondary | ICD-10-CM | POA: Insufficient documentation

## 2016-10-24 DIAGNOSIS — C3401 Malignant neoplasm of right main bronchus: Secondary | ICD-10-CM | POA: Diagnosis not present

## 2016-10-24 DIAGNOSIS — C3491 Malignant neoplasm of unspecified part of right bronchus or lung: Secondary | ICD-10-CM

## 2016-10-24 DIAGNOSIS — R5382 Chronic fatigue, unspecified: Secondary | ICD-10-CM

## 2016-10-24 DIAGNOSIS — Z5112 Encounter for antineoplastic immunotherapy: Secondary | ICD-10-CM

## 2016-10-24 DIAGNOSIS — C787 Secondary malignant neoplasm of liver and intrahepatic bile duct: Secondary | ICD-10-CM | POA: Diagnosis not present

## 2016-10-24 DIAGNOSIS — I2510 Atherosclerotic heart disease of native coronary artery without angina pectoris: Secondary | ICD-10-CM | POA: Diagnosis not present

## 2016-10-24 DIAGNOSIS — C801 Malignant (primary) neoplasm, unspecified: Secondary | ICD-10-CM

## 2016-10-24 DIAGNOSIS — I700 Atherosclerosis of aorta: Secondary | ICD-10-CM | POA: Diagnosis not present

## 2016-10-24 DIAGNOSIS — C7951 Secondary malignant neoplasm of bone: Secondary | ICD-10-CM

## 2016-10-24 DIAGNOSIS — C61 Malignant neoplasm of prostate: Secondary | ICD-10-CM

## 2016-10-24 DIAGNOSIS — C7889 Secondary malignant neoplasm of other digestive organs: Secondary | ICD-10-CM | POA: Diagnosis not present

## 2016-10-24 LAB — COMPREHENSIVE METABOLIC PANEL
ALBUMIN: 2.7 g/dL — AB (ref 3.5–5.0)
ALK PHOS: 214 U/L — AB (ref 40–150)
ALT: 13 U/L (ref 0–55)
AST: 16 U/L (ref 5–34)
Anion Gap: 10 mEq/L (ref 3–11)
BILIRUBIN TOTAL: 0.24 mg/dL (ref 0.20–1.20)
BUN: 40.7 mg/dL — AB (ref 7.0–26.0)
CO2: 20 meq/L — AB (ref 22–29)
Calcium: 9.4 mg/dL (ref 8.4–10.4)
Chloride: 106 mEq/L (ref 98–109)
Creatinine: 2.2 mg/dL — ABNORMAL HIGH (ref 0.7–1.3)
EGFR: 38 mL/min/{1.73_m2} — ABNORMAL LOW (ref >90)
GLUCOSE: 97 mg/dL (ref 70–140)
Potassium: 5.2 mEq/L — ABNORMAL HIGH (ref 3.5–5.1)
SODIUM: 136 meq/L (ref 136–145)
TOTAL PROTEIN: 7.7 g/dL (ref 6.4–8.3)

## 2016-10-24 LAB — CBC WITH DIFFERENTIAL/PLATELET
BASO%: 0.8 % (ref 0.0–2.0)
Basophils Absolute: 0.1 10*3/uL (ref 0.0–0.1)
EOS%: 5.2 % (ref 0.0–7.0)
Eosinophils Absolute: 0.8 10*3/uL — ABNORMAL HIGH (ref 0.0–0.5)
HCT: 37.3 % — ABNORMAL LOW (ref 38.4–49.9)
HEMOGLOBIN: 12.3 g/dL — AB (ref 13.0–17.1)
LYMPH%: 17.7 % (ref 14.0–49.0)
MCH: 30.9 pg (ref 27.2–33.4)
MCHC: 32.9 g/dL (ref 32.0–36.0)
MCV: 93.8 fL (ref 79.3–98.0)
MONO#: 1.4 10*3/uL — ABNORMAL HIGH (ref 0.1–0.9)
MONO%: 9.7 % (ref 0.0–14.0)
NEUT%: 66.6 % (ref 39.0–75.0)
NEUTROS ABS: 9.8 10*3/uL — AB (ref 1.5–6.5)
Platelets: 344 10*3/uL (ref 140–400)
RBC: 3.98 10*6/uL — AB (ref 4.20–5.82)
RDW: 17.7 % — AB (ref 11.0–14.6)
WBC: 14.8 10*3/uL — AB (ref 4.0–10.3)
lymph#: 2.6 10*3/uL (ref 0.9–3.3)

## 2016-10-24 LAB — TSH: TSH: 3.227 m(IU)/L (ref 0.320–4.118)

## 2016-10-24 MED ORDER — SODIUM CHLORIDE 0.9 % IV SOLN
Freq: Once | INTRAVENOUS | Status: AC
  Administered 2016-10-24: 11:00:00 via INTRAVENOUS

## 2016-10-24 MED ORDER — HEPARIN SOD (PORK) LOCK FLUSH 100 UNIT/ML IV SOLN
500.0000 [IU] | Freq: Once | INTRAVENOUS | Status: AC | PRN
  Administered 2016-10-24: 500 [IU]
  Filled 2016-10-24: qty 5

## 2016-10-24 MED ORDER — SODIUM CHLORIDE 0.9 % IV SOLN
240.0000 mg | Freq: Once | INTRAVENOUS | Status: AC
  Administered 2016-10-24: 240 mg via INTRAVENOUS
  Filled 2016-10-24: qty 4

## 2016-10-24 MED ORDER — OXYCODONE-ACETAMINOPHEN 5-325 MG PO TABS: 1.0000 | ORAL_TABLET | ORAL | 0 refills | Status: DC | PRN

## 2016-10-24 MED ORDER — IOPAMIDOL (ISOVUE-300) INJECTION 61%
INTRAVENOUS | Status: AC
  Filled 2016-10-24: qty 30

## 2016-10-24 MED ORDER — IOPAMIDOL (ISOVUE-300) INJECTION 61%
30.0000 mL | Freq: Once | INTRAVENOUS | Status: DC | PRN
  Administered 2016-10-24: 30 mL via ORAL
  Filled 2016-10-24: qty 30

## 2016-10-24 MED ORDER — SODIUM CHLORIDE 0.9 % IJ SOLN
10.0000 mL | INTRAMUSCULAR | Status: DC | PRN
  Administered 2016-10-24: 10 mL
  Filled 2016-10-24: qty 10

## 2016-10-24 NOTE — Telephone Encounter (Signed)
No additional appointments needed. Lab, follow and chemo scheduled throughout May 2018. Patient was given a copy of the AVS report per 10/24/16  [place].

## 2016-10-24 NOTE — Progress Notes (Signed)
Per Dr. Worthy Flank note, okay to treat with Crt of 2.2 and heart rate of 119.

## 2016-10-24 NOTE — Progress Notes (Signed)
[place] [phone]   [phone]  OFFICE PROGRESS NOTE  Francisco Love, Francisco Heinrich, MD Francisco Love [address]  DIAGNOSIS:  1) Stage IV (T2b, N2, M1b) non-small cell lung cancer, adenocarcinoma with negative EGFR mutation and negative gene translocation presented with a large right hilar mass as well as mediastinal lymphadenopathy and metastatic disease to the liver, bone and pancreas diagnosed in February 2016. 2) prostate adenocarcinoma diagnosed at [facility] with Gleason score 9 (4+5).  PRIOR THERAPY: Systemic chemotherapy with carboplatin for AUC of 5 and Alimta 500 MG/M2 every 3 weeks. Status post 6 cycles.  CURRENT THERAPY:  1) Immunotherapy with Nivolumab 240 MG every 2 weeks, status post 38 cycles. 2) Enzalutamide for prostate cancer at [place]: Francisco Love 56 y.o. male came to the clinic today for follow-up visit accompanied by his brother-in-law. The patient is feeling fine today with no specific complaints except for mild fatigue and chronic back pain. He has a good appetite and gained 3 pounds since his last visit. He has shortness of breath with exertion. He denied having any chest pain, cough or hemoptysis. He denied having any nausea, vomiting, diarrhea or constipation. He has no fever or chills. He has no headache or visual changes. The patient had repeat CT scan of the chest, abdomen and pelvis performed earlier today and he is here for evaluation and discussion of his scan results and treatment options.   MEDICAL HISTORY: Past Medical History:  Diagnosis Date  . Chronic fatigue 04/12/2016  . Chronic pain 04/12/2016  . Shortness of breath dyspnea     ALLERGIES:  has No Known Allergies.  MEDICATIONS:  Current Outpatient Prescriptions  Medication Sig Dispense Refill  . acetaminophen (TYLENOL) 325 MG tablet Take 2 tablets (650 mg total) by mouth every 6 (six) hours as needed for  mild pain (or Fever >/= 101).    Marland Kitchen albuterol (PROVENTIL) (2.5 MG/3ML) 0.083% nebulizer solution Take 3 mLs (2.5 mg total) by nebulization every 2 (two) hours as needed for wheezing. 75 mL 12  . AMITIZA 24 MCG capsule     . bicalutamide (CASODEX) 50 MG tablet Take 50 mg by mouth.    . diphenhydrAMINE (BENADRYL) 25 MG tablet Take 25 mg by mouth.    . feeding supplement, ENSURE COMPLETE, (ENSURE COMPLETE) LIQD Take 237 mLs by mouth 3 (three) times daily between meals.    Marland Kitchen FLUOCINOLONE ACETONIDE BODY 0.01 % OIL     . levocetirizine (XYZAL) 5 MG tablet Take 5 mg by mouth.    . lidocaine-prilocaine (EMLA) cream Apply 1 application topically as needed. Apply to port site prior to chemotherapy. 30 g 0  . mirtazapine (REMERON) 15 MG tablet Take 1 tablet (15 mg total) by mouth at bedtime.    Marland Kitchen oxyCODONE-acetaminophen (PERCOCET/ROXICET) 5-325 MG tablet Take 1 tablet by mouth every 4 (four) hours as needed for severe pain. 60 tablet 0  . polyethylene glycol (MIRALAX / GLYCOLAX) packet Take 17 g by mouth daily.    . prochlorperazine (COMPAZINE) 10 MG tablet Take 1 tablet (10 mg total) by mouth every 6 (six) hours as needed for nausea or vomiting. 30 tablet 1  . senna-docusate (SENOKOT-S) 8.6-50 MG per tablet Take 1 tablet by mouth daily.    . SUMAtriptan (IMITREX) 25 MG tablet     . tamsulosin (FLOMAX) 0.4 MG CAPS capsule     . triamcinolone ointment (KENALOG) 0.1 %  Apply to arms twice a day until clear, then stop.    . valACYclovir (VALTREX) 1000 MG tablet Take 1,000 mg by mouth.    [name] 40 MG capsule      No current facility-administered medications for this visit.    Facility-Administered Medications Ordered in Other Visits  Medication Dose Route Frequency Provider Last Rate Last Dose  . iopamidol (ISOVUE-300) 61 % injection 30 mL  30 mL Oral Once PRN Curt Bears, MD   30 mL at 10/24/16 0741  . iopamidol (ISOVUE-300) 61 % injection             SURGICAL HISTORY:  Past Surgical History:    Procedure Laterality Date  . APPENDECTOMY    . VIDEO BRONCHOSCOPY N/A 09/21/2014   Procedure: VIDEO BRONCHOSCOPY WITH FLUORO;  Surgeon: Rigoberto Noel, MD;  Location: [place];  Service: Cardiopulmonary;  Laterality: N/A;  . VIDEO BRONCHOSCOPY WITH ENDOBRONCHIAL ULTRASOUND N/A 09/23/2014   Procedure: VIDEO BRONCHOSCOPY WITH ENDOBRONCHIAL ULTRASOUND;  Surgeon: Rigoberto Noel, MD;  Location: [place];  Service: Pulmonary;  Laterality: N/A;    REVIEW OF SYSTEMS:  Constitutional: positive for fatigue Eyes: negative Ears, nose, mouth, throat, and face: negative Respiratory: positive for dyspnea on exertion Cardiovascular: negative Gastrointestinal: negative Genitourinary:negative Integument/breast: negative Hematologic/lymphatic: negative Musculoskeletal:positive for back pain Neurological: negative Behavioral/Psych: negative Endocrine: negative Allergic/Immunologic: negative   PHYSICAL EXAMINATION: General appearance: alert, cooperative and no distress Head: Normocephalic, without obvious abnormality, atraumatic Neck: no adenopathy, no JVD, supple, symmetrical, trachea midline and thyroid not enlarged, symmetric, no tenderness/mass/nodules Lymph nodes: Cervical, supraclavicular, and axillary nodes normal. Resp: clear to auscultation bilaterally Back: symmetric, no curvature. ROM normal. No CVA tenderness. Cardio: regular rate and rhythm, S1, S2 normal, no murmur, click, rub or gallop GI: soft, non-tender; bowel sounds normal; no masses,  no organomegaly Extremities: extremities normal, atraumatic, no cyanosis or edema Neurologic: Alert and oriented X 3, normal strength and tone. Normal symmetric reflexes. Normal coordination and gait  ECOG PERFORMANCE STATUS: 1 - Symptomatic but completely ambulatory  Blood pressure (!) 117/98, pulse (!) 119, temperature 97.5 F (36.4 C), temperature source Oral, resp. rate 18, weight 163 lb 4.8 oz (74.1 kg), SpO2 94 %.  LABORATORY DATA: Lab Results   Component Value Date   WBC 14.8 (H) 10/24/2016   HGB 12.3 (L) 10/24/2016   HCT 37.3 (L) 10/24/2016   MCV 93.8 10/24/2016   PLT 344 10/24/2016      Chemistry      Component Value Date/Time   NA 139 10/10/2016 1151   K 5.1 10/10/2016 1151   CL 108 10/25/2014 0845   CO2 15 (L) 10/10/2016 1151   BUN 43.1 (H) 10/10/2016 1151   CREATININE 2.2 (H) 10/10/2016 1151      Component Value Date/Time   CALCIUM 9.3 10/10/2016 1151   ALKPHOS 201 (H) 10/10/2016 1151   AST 11 10/10/2016 1151   ALT 9 10/10/2016 1151   BILITOT 0.26 10/10/2016 1151       RADIOGRAPHIC STUDIES: No results found. ASSESSMENT AND PLAN:  This is a very pleasant 56 years old African-American male with metastatic non-small cell lung cancer, adenocarcinoma status post induction systemic chemotherapy with carboplatin and Alimta. He is currently undergoing treatment with immunotherapy with Nivolumab status post 39 cycles. The patient is tolerating his treatment well. He had repeat CT scan of the chest, abdomen and pelvis performed earlier today. I personally and independently reviewed the scan images and did not see any clear evidence for disease progression  but the final report is still pending. I recommended for the patient to proceed with his treatment with immunotherapy with Nivolumab with cycle #40 today. I will see him back for follow-up visit in 2 weeks for evaluation before the next cycle of his treatment. For the history of prostate adenocarcinoma, he is currently receiving treatment with hormonal therapy under his urologist at [facility]. For pain management, I gave him a refill for Percocet. The patient also has baseline tachycardia and I encouraged him to increase his oral intake of fluid. For the insufficiency, his serum creatinine is a stable. We will continue to monitor. He was advised to call immediately if he has any concerning symptoms in the interval. The patient voices understanding of current  disease status and treatment options and is in agreement with the current care plan. All questions were answered. The patient knows to call the clinic with any problems, questions or concerns. We can certainly see the patient much sooner if necessary.  Disclaimer: This note was dictated with voice recognition software. Similar sounding words can inadvertently be transcribed and may not be corrected upon review.

## 2016-10-24 NOTE — Patient Instructions (Signed)
[facility] Discharge Instructions for Patients Receiving Chemotherapy  Today you received the following chemotherapy agents:  Nivolumab.  To help prevent nausea and vomiting after your treatment, we encourage you to take your nausea medication as directed.   If you develop nausea and vomiting that is not controlled by your nausea medication, call the clinic.   BELOW ARE SYMPTOMS THAT SHOULD BE REPORTED IMMEDIATELY:  *FEVER GREATER THAN 100.5 F  *CHILLS WITH OR WITHOUT FEVER  NAUSEA AND VOMITING THAT IS NOT CONTROLLED WITH YOUR NAUSEA MEDICATION  *UNUSUAL SHORTNESS OF BREATH  *UNUSUAL BRUISING OR BLEEDING  TENDERNESS IN MOUTH AND THROAT WITH OR WITHOUT PRESENCE OF ULCERS  *URINARY PROBLEMS  *BOWEL PROBLEMS  UNUSUAL RASH Items with * indicate a potential emergency and should be followed up as soon as possible.  Feel free to call the clinic you have any questions or concerns. The clinic phone number is [phone].  Please show the CHEMO ALERT CARD at check-in to the Emergency Department and triage nurse.   

## 2016-10-25 ENCOUNTER — Ambulatory Visit: Payer: Medicare Other | Admitting: Internal Medicine

## 2016-10-25 ENCOUNTER — Other Ambulatory Visit: Payer: Medicare Other

## 2016-10-25 ENCOUNTER — Ambulatory Visit: Payer: Medicare Other

## 2016-11-07 ENCOUNTER — Ambulatory Visit (HOSPITAL_BASED_OUTPATIENT_CLINIC_OR_DEPARTMENT_OTHER): Payer: Medicare Other

## 2016-11-07 ENCOUNTER — Other Ambulatory Visit (HOSPITAL_BASED_OUTPATIENT_CLINIC_OR_DEPARTMENT_OTHER): Payer: Medicare Other

## 2016-11-07 ENCOUNTER — Encounter: Payer: Self-pay | Admitting: Internal Medicine

## 2016-11-07 ENCOUNTER — Ambulatory Visit (HOSPITAL_BASED_OUTPATIENT_CLINIC_OR_DEPARTMENT_OTHER): Payer: Medicare Other | Admitting: Internal Medicine

## 2016-11-07 DIAGNOSIS — C3491 Malignant neoplasm of unspecified part of right bronchus or lung: Secondary | ICD-10-CM

## 2016-11-07 DIAGNOSIS — C801 Malignant (primary) neoplasm, unspecified: Principal | ICD-10-CM

## 2016-11-07 DIAGNOSIS — C61 Malignant neoplasm of prostate: Secondary | ICD-10-CM | POA: Diagnosis not present

## 2016-11-07 DIAGNOSIS — C7951 Secondary malignant neoplasm of bone: Secondary | ICD-10-CM

## 2016-11-07 DIAGNOSIS — Z79899 Other long term (current) drug therapy: Secondary | ICD-10-CM

## 2016-11-07 DIAGNOSIS — G893 Neoplasm related pain (acute) (chronic): Secondary | ICD-10-CM

## 2016-11-07 DIAGNOSIS — C7889 Secondary malignant neoplasm of other digestive organs: Secondary | ICD-10-CM | POA: Diagnosis not present

## 2016-11-07 DIAGNOSIS — C787 Secondary malignant neoplasm of liver and intrahepatic bile duct: Secondary | ICD-10-CM

## 2016-11-07 DIAGNOSIS — C3401 Malignant neoplasm of right main bronchus: Secondary | ICD-10-CM

## 2016-11-07 DIAGNOSIS — Z5112 Encounter for antineoplastic immunotherapy: Secondary | ICD-10-CM

## 2016-11-07 DIAGNOSIS — R5382 Chronic fatigue, unspecified: Secondary | ICD-10-CM

## 2016-11-07 LAB — CBC WITH DIFFERENTIAL/PLATELET
BASO%: 0.6 % (ref 0.0–2.0)
Basophils Absolute: 0.1 10*3/uL (ref 0.0–0.1)
EOS ABS: 0.6 10*3/uL — AB (ref 0.0–0.5)
EOS%: 4.3 % (ref 0.0–7.0)
HCT: 36 % — ABNORMAL LOW (ref 38.4–49.9)
HEMOGLOBIN: 11.8 g/dL — AB (ref 13.0–17.1)
LYMPH%: 9.8 % — ABNORMAL LOW (ref 14.0–49.0)
MCH: 30.8 pg (ref 27.2–33.4)
MCHC: 32.7 g/dL (ref 32.0–36.0)
MCV: 94.1 fL (ref 79.3–98.0)
MONO#: 0.9 10*3/uL (ref 0.1–0.9)
MONO%: 6.5 % (ref 0.0–14.0)
NEUT%: 78.8 % — ABNORMAL HIGH (ref 39.0–75.0)
NEUTROS ABS: 11.1 10*3/uL — AB (ref 1.5–6.5)
Platelets: 306 10*3/uL (ref 140–400)
RBC: 3.83 10*6/uL — ABNORMAL LOW (ref 4.20–5.82)
RDW: 16.9 % — AB (ref 11.0–14.6)
WBC: 14 10*3/uL — AB (ref 4.0–10.3)
lymph#: 1.4 10*3/uL (ref 0.9–3.3)

## 2016-11-07 LAB — COMPREHENSIVE METABOLIC PANEL
ALBUMIN: 2.6 g/dL — AB (ref 3.5–5.0)
ALT: 13 U/L (ref 0–55)
AST: 12 U/L (ref 5–34)
Alkaline Phosphatase: 152 U/L — ABNORMAL HIGH (ref 40–150)
Anion Gap: 10 mEq/L (ref 3–11)
BILIRUBIN TOTAL: 0.24 mg/dL (ref 0.20–1.20)
BUN: 45.7 mg/dL — AB (ref 7.0–26.0)
CO2: 15 mEq/L — ABNORMAL LOW (ref 22–29)
Calcium: 9.2 mg/dL (ref 8.4–10.4)
Chloride: 112 mEq/L — ABNORMAL HIGH (ref 98–109)
Creatinine: 2.5 mg/dL — ABNORMAL HIGH (ref 0.7–1.3)
EGFR: 31 mL/min/{1.73_m2} — ABNORMAL LOW (ref >90)
GLUCOSE: 102 mg/dL (ref 70–140)
Potassium: 5 mEq/L (ref 3.5–5.1)
SODIUM: 138 meq/L (ref 136–145)
TOTAL PROTEIN: 7.3 g/dL (ref 6.4–8.3)

## 2016-11-07 LAB — TSH: TSH: 3.717 m(IU)/L (ref 0.320–4.118)

## 2016-11-07 MED ORDER — SODIUM CHLORIDE 0.9 % IV SOLN
Freq: Once | INTRAVENOUS | Status: AC
  Administered 2016-11-07: 09:00:00 via INTRAVENOUS

## 2016-11-07 MED ORDER — SODIUM CHLORIDE 0.9 % IV SOLN
240.0000 mg | Freq: Once | INTRAVENOUS | Status: AC
  Administered 2016-11-07: 240 mg via INTRAVENOUS
  Filled 2016-11-07: qty 24

## 2016-11-07 MED ORDER — LIDOCAINE-PRILOCAINE 2.5-2.5 % EX CREA: 1.0000 "application " | TOPICAL_CREAM | CUTANEOUS | 0 refills | Status: AC | PRN

## 2016-11-07 MED ORDER — OXYCODONE-ACETAMINOPHEN 5-325 MG PO TABS: 1.0000 | ORAL_TABLET | ORAL | 0 refills | Status: DC | PRN

## 2016-11-07 MED ORDER — HEPARIN SOD (PORK) LOCK FLUSH 100 UNIT/ML IV SOLN
500.0000 [IU] | Freq: Once | INTRAVENOUS | Status: AC | PRN
  Administered 2016-11-07: 500 [IU]
  Filled 2016-11-07: qty 5

## 2016-11-07 MED ORDER — SODIUM CHLORIDE 0.9 % IJ SOLN
10.0000 mL | INTRAMUSCULAR | Status: DC | PRN
  Administered 2016-11-07: 10 mL
  Filled 2016-11-07: qty 10

## 2016-11-07 NOTE — Patient Instructions (Signed)
[facility] Discharge Instructions for Patients Receiving Chemotherapy  Today you received the following chemotherapy agents nivolumab   To help prevent nausea and vomiting after your treatment, we encourage you to take your nausea medication as directed   If you develop nausea and vomiting that is not controlled by your nausea medication, call the clinic.   BELOW ARE SYMPTOMS THAT SHOULD BE REPORTED IMMEDIATELY:  *FEVER GREATER THAN 100.5 F  *CHILLS WITH OR WITHOUT FEVER  NAUSEA AND VOMITING THAT IS NOT CONTROLLED WITH YOUR NAUSEA MEDICATION  *UNUSUAL SHORTNESS OF BREATH  *UNUSUAL BRUISING OR BLEEDING  TENDERNESS IN MOUTH AND THROAT WITH OR WITHOUT PRESENCE OF ULCERS  *URINARY PROBLEMS  *BOWEL PROBLEMS  UNUSUAL RASH Items with * indicate a potential emergency and should be followed up as soon as possible.  Feel free to call the clinic you have any questions or concerns. The clinic phone number is [phone].  

## 2016-11-07 NOTE — Progress Notes (Signed)
[place] [phone]   Fax:(336) [phone]  OFFICE PROGRESS NOTE  [name], [name], MD [place] [address]  DIAGNOSIS:  1) Stage IV (T2b, N2, M1b) non-small cell lung cancer, adenocarcinoma with negative EGFR mutation and negative gene translocation presented with a large right hilar mass as well as mediastinal lymphadenopathy and metastatic disease to the liver, bone and pancreas diagnosed in February 2016. 2) prostate adenocarcinoma diagnosed at [facility] with Gleason score 9 (4+5).  PRIOR THERAPY: Systemic chemotherapy with carboplatin for AUC of 5 and Alimta 500 MG/M2 every 3 weeks. Status post 6 cycles.  CURRENT THERAPY:  1) Immunotherapy with Nivolumab 240 MG every 2 weeks, status post 40 cycles. 2) Enzalutamide for prostate cancer at [place]: Francisco Love 56 y.o. male came to the clinic today for follow-up visit accompanied by his brother-in-law. The patient is feeling fine today with no specific complaints except for the chronic back pain. He denied having any chest pain, shortness breath, cough or hemoptysis. He has no fever or chills. He denied having any significant weight loss or night sweats. He is here today for evaluation before starting cycle #41 of his treatment.   MEDICAL HISTORY: Past Medical History:  Diagnosis Date  . Chronic fatigue 04/12/2016  . Chronic pain 04/12/2016  . Shortness of breath dyspnea     ALLERGIES:  has No Known Allergies.  MEDICATIONS:  Current Outpatient Prescriptions  Medication Sig Dispense Refill  . acetaminophen (TYLENOL) 325 MG tablet Take 2 tablets (650 mg total) by mouth every 6 (six) hours as needed for mild pain (or Fever >/= 101).    Marland Kitchen albuterol (PROVENTIL) (2.5 MG/3ML) 0.083% nebulizer solution Take 3 mLs (2.5 mg total) by nebulization every 2 (two) hours as needed for wheezing. 75 mL 12  . AMITIZA 24 MCG capsule     . bicalutamide  (CASODEX) 50 MG tablet Take 50 mg by mouth.    Marland Kitchen FLUOCINOLONE ACETONIDE BODY 0.01 % OIL     . levocetirizine (XYZAL) 5 MG tablet Take 5 mg by mouth.    . lidocaine-prilocaine (EMLA) cream Apply 1 application topically as needed. Apply to port site prior to chemotherapy. 30 g 0  . mirtazapine (REMERON) 15 MG tablet Take 1 tablet (15 mg total) by mouth at bedtime.    Marland Kitchen oxyCODONE-acetaminophen (PERCOCET/ROXICET) 5-325 MG tablet Take 1 tablet by mouth every 4 (four) hours as needed for severe pain. 60 tablet 0  . tamsulosin (FLOMAX) 0.4 MG CAPS capsule     . XTANDI 40 MG capsule     . diphenhydrAMINE (BENADRYL) 25 MG tablet Take 25 mg by mouth.    . feeding supplement, ENSURE COMPLETE, (ENSURE COMPLETE) LIQD Take 237 mLs by mouth 3 (three) times daily between meals. (Patient not taking: Reported on 11/07/2016)    . polyethylene glycol (MIRALAX / GLYCOLAX) packet Take 17 g by mouth daily.    . prochlorperazine (COMPAZINE) 10 MG tablet Take 1 tablet (10 mg total) by mouth every 6 (six) hours as needed for nausea or vomiting. (Patient not taking: Reported on 11/07/2016) 30 tablet 1  . senna-docusate (SENOKOT-S) 8.6-50 MG per tablet Take 1 tablet by mouth daily.    . SUMAtriptan (IMITREX) 25 MG tablet     . triamcinolone ointment (KENALOG) 0.1 % Apply to arms twice a day until clear, then stop.     No current facility-administered medications for this visit.  SURGICAL HISTORY:  Past Surgical History:  Procedure Laterality Date  . APPENDECTOMY    . VIDEO BRONCHOSCOPY N/A 09/21/2014   Procedure: VIDEO BRONCHOSCOPY WITH FLUORO;  Surgeon: Rigoberto Noel, MD;  Location: [place];  Service: Cardiopulmonary;  Laterality: N/A;  . VIDEO BRONCHOSCOPY WITH ENDOBRONCHIAL ULTRASOUND N/A 09/23/2014   Procedure: VIDEO BRONCHOSCOPY WITH ENDOBRONCHIAL ULTRASOUND;  Surgeon: Rigoberto Noel, MD;  Location: [place];  Service: Pulmonary;  Laterality: N/A;    REVIEW OF SYSTEMS:  A comprehensive review of systems was  negative except for: Constitutional: positive for fatigue Musculoskeletal: positive for back pain   PHYSICAL EXAMINATION: General appearance: alert, cooperative and no distress Head: Normocephalic, without obvious abnormality, atraumatic Neck: no adenopathy, no JVD, supple, symmetrical, trachea midline and thyroid not enlarged, symmetric, no tenderness/mass/nodules Lymph nodes: Cervical, supraclavicular, and axillary nodes normal. Resp: clear to auscultation bilaterally Back: symmetric, no curvature. ROM normal. No CVA tenderness. Cardio: regular rate and rhythm, S1, S2 normal, no murmur, click, rub or gallop GI: soft, non-tender; bowel sounds normal; no masses,  no organomegaly Extremities: extremities normal, atraumatic, no cyanosis or edema  ECOG PERFORMANCE STATUS: 1 - Symptomatic but completely ambulatory  Blood pressure 95/67, pulse 100, temperature 97.6 F (36.4 C), temperature source Oral, resp. rate 18, weight 164 lb 6.4 oz (74.6 kg), SpO2 100 %.  LABORATORY DATA: Lab Results  Component Value Date   WBC 14.0 (H) 11/07/2016   HGB 11.8 (L) 11/07/2016   HCT 36.0 (L) 11/07/2016   MCV 94.1 11/07/2016   PLT 306 11/07/2016      Chemistry      Component Value Date/Time   NA 138 11/07/2016 0746   K 5.0 11/07/2016 0746   CL 108 10/25/2014 0845   CO2 15 (L) 11/07/2016 0746   BUN 45.7 (H) 11/07/2016 0746   CREATININE 2.5 (H) 11/07/2016 0746      Component Value Date/Time   CALCIUM 9.2 11/07/2016 0746   ALKPHOS 152 (H) 11/07/2016 0746   AST 12 11/07/2016 0746   ALT 13 11/07/2016 0746   BILITOT 0.24 11/07/2016 0746       RADIOGRAPHIC STUDIES: Ct Abdomen Pelvis Wo Contrast  Result Date: 10/24/2016 CLINICAL DATA:  Non-small cell lung cancer, chemotherapy in progress. History of prostate cancer. EXAM: CT CHEST, ABDOMEN AND PELVIS WITHOUT CONTRAST TECHNIQUE: Multidetector CT imaging of the chest, abdomen and pelvis was performed following the standard protocol without IV  contrast. COMPARISON:  08/02/2016 and 05/24/2016. FINDINGS: CT CHEST FINDINGS Cardiovascular: Right IJ Port-A-Cath terminates in the right atrium. Atherosclerotic calcification of the arterial vasculature, including left anterior descending coronary artery. Heart size normal. No pericardial effusion. Mediastinum/Nodes: Partially calcified right paratracheal mass measures 3.2 x 5.0 cm, previously 3.0 x 4.4 cm when remeasured. There are calcified right hilar lymph nodes. Hilar regions are otherwise difficult to evaluate without IV contrast. Subcarinal lymph node measures 1.6 cm, stable. No axillary adenopathy. Esophagus is grossly unremarkable. Lungs/Pleura: Pleural thickening is seen in the hemithoraces bilaterally. Biapical pleuroparenchymal scarring. Centrilobular and paraseptal emphysema. Scattered pulmonary nodules measure up to 7 mm bilaterally, stable from 05/24/2016. No pleural fluid. Airway is unremarkable. Musculoskeletal: Sclerotic lesions are seen throughout the visualized osseous structures, as before. CT ABDOMEN PELVIS FINDINGS Hepatobiliary: Subcentimeter low-attenuation lesion in the left hepatic lobe is unchanged and likely a cyst. Definitive characterization is limited without IV contrast and due to size. Liver and gallbladder are otherwise unremarkable. There may be mild dilatation of the lower common bile duct versus a low-attenuation lesion in the  head of the pancreas, unchanged. Pancreas: Calcification is seen in the head of the pancreas. There is dilatation of the lower common bile duct versus a discrete low-attenuation lesion, unchanged. Pancreas is otherwise unremarkable. Duct is visualized but is not dilated. Spleen: Negative. Adrenals/Urinary Tract: Adrenal glands and right kidney are unremarkable. Left kidney is somewhat atrophic. Right ureter is duplicated and appears to join distally. Ureters are decompressed. Bladder is low in volume. Stomach/Bowel: Stomach, small bowel, appendix and  colon are unremarkable. Vascular/Lymphatic: Atherosclerotic calcification of the arterial vasculature without abdominal aortic aneurysm. No pathologically enlarged lymph nodes. Reproductive: Prostate is visualized. Other: Small bilateral inguinal hernias contain fat. No free fluid. Mesenteries and peritoneum are unremarkable. Musculoskeletal: Sclerotic lesions are seen throughout the visualized osseous structures. Avascular necrosis and collapse of both femoral heads with secondary degenerative changes. IMPRESSION: 1. Scattered pulmonary nodules and mediastinal adenopathy are stable. 2. Osseous metastatic disease, grossly stable. 3. Aortic atherosclerosis (ICD10-170.0). Left anterior descending coronary artery calcification. 4. Chronic calcific pancreatitis. 5.  Emphysema (ICD10-J43.9). 6. Avascular necrosis involving both femoral heads with collapse and secondary degenerative changes. Electronically Signed   By: Lorin Picket M.D.   On: 10/24/2016 10:22   Ct Chest Wo Contrast  Result Date: 10/24/2016 CLINICAL DATA:  Non-small cell lung cancer, chemotherapy in progress. History of prostate cancer. EXAM: CT CHEST, ABDOMEN AND PELVIS WITHOUT CONTRAST TECHNIQUE: Multidetector CT imaging of the chest, abdomen and pelvis was performed following the standard protocol without IV contrast. COMPARISON:  08/02/2016 and 05/24/2016. FINDINGS: CT CHEST FINDINGS Cardiovascular: Right IJ Port-A-Cath terminates in the right atrium. Atherosclerotic calcification of the arterial vasculature, including left anterior descending coronary artery. Heart size normal. No pericardial effusion. Mediastinum/Nodes: Partially calcified right paratracheal mass measures 3.2 x 5.0 cm, previously 3.0 x 4.4 cm when remeasured. There are calcified right hilar lymph nodes. Hilar regions are otherwise difficult to evaluate without IV contrast. Subcarinal lymph node measures 1.6 cm, stable. No axillary adenopathy. Esophagus is grossly unremarkable.  Lungs/Pleura: Pleural thickening is seen in the hemithoraces bilaterally. Biapical pleuroparenchymal scarring. Centrilobular and paraseptal emphysema. Scattered pulmonary nodules measure up to 7 mm bilaterally, stable from 05/24/2016. No pleural fluid. Airway is unremarkable. Musculoskeletal: Sclerotic lesions are seen throughout the visualized osseous structures, as before. CT ABDOMEN PELVIS FINDINGS Hepatobiliary: Subcentimeter low-attenuation lesion in the left hepatic lobe is unchanged and likely a cyst. Definitive characterization is limited without IV contrast and due to size. Liver and gallbladder are otherwise unremarkable. There may be mild dilatation of the lower common bile duct versus a low-attenuation lesion in the head of the pancreas, unchanged. Pancreas: Calcification is seen in the head of the pancreas. There is dilatation of the lower common bile duct versus a discrete low-attenuation lesion, unchanged. Pancreas is otherwise unremarkable. Duct is visualized but is not dilated. Spleen: Negative. Adrenals/Urinary Tract: Adrenal glands and right kidney are unremarkable. Left kidney is somewhat atrophic. Right ureter is duplicated and appears to join distally. Ureters are decompressed. Bladder is low in volume. Stomach/Bowel: Stomach, small bowel, appendix and colon are unremarkable. Vascular/Lymphatic: Atherosclerotic calcification of the arterial vasculature without abdominal aortic aneurysm. No pathologically enlarged lymph nodes. Reproductive: Prostate is visualized. Other: Small bilateral inguinal hernias contain fat. No free fluid. Mesenteries and peritoneum are unremarkable. Musculoskeletal: Sclerotic lesions are seen throughout the visualized osseous structures. Avascular necrosis and collapse of both femoral heads with secondary degenerative changes. IMPRESSION: 1. Scattered pulmonary nodules and mediastinal adenopathy are stable. 2. Osseous metastatic disease, grossly stable. 3. Aortic  atherosclerosis (ICD10-170.0). Left  anterior descending coronary artery calcification. 4. Chronic calcific pancreatitis. 5.  Emphysema (ICD10-J43.9). 6. Avascular necrosis involving both femoral heads with collapse and secondary degenerative changes. Electronically Signed   By: Lorin Picket M.D.   On: 10/24/2016 10:22   ASSESSMENT AND PLAN:  This is a very pleasant 56 years old African-American male with metastatic non-small cell lung cancer, adenocarcinoma status post systemic chemotherapy with platinum and Alimta and currently on second line treatment with immunotherapy with Nivolumab status post 40 cycles. She is treating the treatment well with no significant adverse effects. I recommended for him to proceed with cycle #41 today as a scheduled. I will see him back for follow-up visit in 2 weeks for evaluation before the next dose of his treatment. For pain management he was given a refill of Percocet today. I also gave him a refill for Emla cream. He was advised to call immediately if he has any concerning symptoms in the interval. The patient voices understanding of current disease status and treatment options and is in agreement with the current care plan. All questions were answered. The patient knows to call the clinic with any problems, questions or concerns. We can certainly see the patient much sooner if necessary. I spent 10 minutes counseling the patient face to face. The total time spent in the appointment was 15 minutes.  Disclaimer: This note was dictated with voice recognition software. Similar sounding words can inadvertently be transcribed and may not be corrected upon review.

## 2016-11-08 ENCOUNTER — Encounter: Payer: Self-pay | Admitting: Internal Medicine

## 2016-11-08 NOTE — Progress Notes (Signed)
Received PA request for Lidocaine-Prilocaine cream.  Called [employer] to initiate PA and spoke with Arlene. She asked a few questions and then had a clinical pharmacist join in on the call. Joe approved through 02/08/17. Arlene faxed over confirmation which shows coverage period of 08/18/16-02/08/17.  [place] in [place]'@[phone]'$ [phone]) to advise of approval. She states it went through on her end.

## 2016-11-21 ENCOUNTER — Other Ambulatory Visit (HOSPITAL_BASED_OUTPATIENT_CLINIC_OR_DEPARTMENT_OTHER): Payer: Medicare Other

## 2016-11-21 ENCOUNTER — Ambulatory Visit: Payer: Medicare Other

## 2016-11-21 ENCOUNTER — Encounter: Payer: Self-pay | Admitting: Internal Medicine

## 2016-11-21 ENCOUNTER — Ambulatory Visit (HOSPITAL_BASED_OUTPATIENT_CLINIC_OR_DEPARTMENT_OTHER): Payer: Medicare Other | Admitting: Internal Medicine

## 2016-11-21 DIAGNOSIS — G893 Neoplasm related pain (acute) (chronic): Secondary | ICD-10-CM

## 2016-11-21 DIAGNOSIS — R5382 Chronic fatigue, unspecified: Secondary | ICD-10-CM

## 2016-11-21 DIAGNOSIS — C7889 Secondary malignant neoplasm of other digestive organs: Secondary | ICD-10-CM | POA: Diagnosis not present

## 2016-11-21 DIAGNOSIS — C3401 Malignant neoplasm of right main bronchus: Secondary | ICD-10-CM

## 2016-11-21 DIAGNOSIS — C3491 Malignant neoplasm of unspecified part of right bronchus or lung: Secondary | ICD-10-CM

## 2016-11-21 DIAGNOSIS — C787 Secondary malignant neoplasm of liver and intrahepatic bile duct: Secondary | ICD-10-CM | POA: Diagnosis not present

## 2016-11-21 DIAGNOSIS — C61 Malignant neoplasm of prostate: Secondary | ICD-10-CM

## 2016-11-21 DIAGNOSIS — Z79899 Other long term (current) drug therapy: Secondary | ICD-10-CM | POA: Diagnosis not present

## 2016-11-21 DIAGNOSIS — C7951 Secondary malignant neoplasm of bone: Secondary | ICD-10-CM | POA: Diagnosis not present

## 2016-11-21 DIAGNOSIS — C801 Malignant (primary) neoplasm, unspecified: Principal | ICD-10-CM

## 2016-11-21 DIAGNOSIS — R21 Rash and other nonspecific skin eruption: Secondary | ICD-10-CM

## 2016-11-21 LAB — CBC WITH DIFFERENTIAL/PLATELET
BASO%: 0.8 % (ref 0.0–2.0)
BASOS ABS: 0.1 10*3/uL (ref 0.0–0.1)
EOS ABS: 1.4 10*3/uL — AB (ref 0.0–0.5)
EOS%: 8.9 % — ABNORMAL HIGH (ref 0.0–7.0)
HEMATOCRIT: 36 % — AB (ref 38.4–49.9)
HEMOGLOBIN: 12 g/dL — AB (ref 13.0–17.1)
LYMPH#: 1.7 10*3/uL (ref 0.9–3.3)
LYMPH%: 10.7 % — ABNORMAL LOW (ref 14.0–49.0)
MCH: 30.3 pg (ref 27.2–33.4)
MCHC: 33.4 g/dL (ref 32.0–36.0)
MCV: 90.7 fL (ref 79.3–98.0)
MONO#: 1.2 10*3/uL — AB (ref 0.1–0.9)
MONO%: 7.4 % (ref 0.0–14.0)
NEUT#: 11.7 10*3/uL — ABNORMAL HIGH (ref 1.5–6.5)
NEUT%: 72.2 % (ref 39.0–75.0)
PLATELETS: 497 10*3/uL — AB (ref 140–400)
RBC: 3.97 10*6/uL — ABNORMAL LOW (ref 4.20–5.82)
RDW: 16.4 % — AB (ref 11.0–14.6)
WBC: 16.2 10*3/uL — ABNORMAL HIGH (ref 4.0–10.3)

## 2016-11-21 LAB — COMPREHENSIVE METABOLIC PANEL
ALBUMIN: 2.7 g/dL — AB (ref 3.5–5.0)
ALK PHOS: 193 U/L — AB (ref 40–150)
ALT: 9 U/L (ref 0–55)
AST: 13 U/L (ref 5–34)
Anion Gap: 12 mEq/L — ABNORMAL HIGH (ref 3–11)
BUN: 53.3 mg/dL — ABNORMAL HIGH (ref 7.0–26.0)
CALCIUM: 9.1 mg/dL (ref 8.4–10.4)
CO2: 14 mEq/L — ABNORMAL LOW (ref 22–29)
Chloride: 110 mEq/L — ABNORMAL HIGH (ref 98–109)
Creatinine: 2.7 mg/dL — ABNORMAL HIGH (ref 0.7–1.3)
EGFR: 29 mL/min/{1.73_m2} — AB (ref >90)
Glucose: 91 mg/dl (ref 70–140)
Potassium: 5 mEq/L (ref 3.5–5.1)
Sodium: 136 mEq/L (ref 136–145)
Total Protein: 7.7 g/dL (ref 6.4–8.3)

## 2016-11-21 LAB — TSH: TSH: 2.363 m[IU]/L (ref 0.320–4.118)

## 2016-11-21 MED ORDER — METHYLPREDNISOLONE 4 MG PO TBPK: ORAL_TABLET | ORAL | 0 refills | Status: DC

## 2016-11-21 MED ORDER — OXYCODONE-ACETAMINOPHEN 5-325 MG PO TABS: 1.0000 | ORAL_TABLET | ORAL | 0 refills | Status: DC | PRN

## 2016-11-21 NOTE — Progress Notes (Signed)
[place] Telephone:(336) [phone]   [phone]  OFFICE PROGRESS NOTE  [name], [name], MD [place] [address]  DIAGNOSIS:  1) Stage IV (T2b, N2, M1b) non-small cell lung cancer, adenocarcinoma with negative EGFR mutation and negative gene translocation presented with a large right hilar mass as well as mediastinal lymphadenopathy and metastatic disease to the liver, bone and pancreas diagnosed in February 2016. 2) prostate adenocarcinoma diagnosed at [facility] with Gleason score 9 (4+5).  PRIOR THERAPY: Systemic chemotherapy with carboplatin for AUC of 5 and Alimta 500 MG/M2 every 3 weeks. Status post 6 cycles.  CURRENT THERAPY:  1) Immunotherapy with Nivolumab 240 MG every 2 weeks, status post 41 cycles. 2) Enzalutamide for prostate cancer at [place]: Francisco Love 56 y.o. male returns to the clinic today for follow-up visit accompanied by his brother-in-law. The patient is feeling fine today with no specific complaints but he developed what he describes as water bumps on the elbows bilaterally more on the right side. This developed a week ago. It was getting worse. He did not apply any lotions or cream today. He did not have any change in his medication. He otherwise feeling fine. He denied having any chest pain, shortness of breath except with exertion, cough or hemoptysis. He has no fever or chills. He denied having any nausea, vomiting, diarrhea or constipation. He is here today for evaluation before starting cycle #42.   MEDICAL HISTORY: Past Medical History:  Diagnosis Date  . Chronic fatigue 04/12/2016  . Chronic pain 04/12/2016  . Shortness of breath dyspnea     ALLERGIES:  has No Known Allergies.  MEDICATIONS:  Current Outpatient Prescriptions  Medication Sig Dispense Refill  . acetaminophen (TYLENOL) 325 MG tablet Take 2 tablets (650 mg total) by mouth every 6 (six) hours as needed  for mild pain (or Fever >/= 101).    Marland Kitchen albuterol (PROVENTIL) (2.5 MG/3ML) 0.083% nebulizer solution Take 3 mLs (2.5 mg total) by nebulization every 2 (two) hours as needed for wheezing. 75 mL 12  . AMITIZA 24 MCG capsule     . bicalutamide (CASODEX) 50 MG tablet Take 50 mg by mouth.    . diphenhydrAMINE (BENADRYL) 25 MG tablet Take 25 mg by mouth.    . feeding supplement, ENSURE COMPLETE, (ENSURE COMPLETE) LIQD Take 237 mLs by mouth 3 (three) times daily between meals. (Patient not taking: Reported on 11/07/2016)    . FLUOCINOLONE ACETONIDE BODY 0.01 % OIL     . levocetirizine (XYZAL) 5 MG tablet Take 5 mg by mouth.    . lidocaine-prilocaine (EMLA) cream Apply 1 application topically as needed. Apply to port site prior to chemotherapy. 30 g 0  . mirtazapine (REMERON) 15 MG tablet Take 1 tablet (15 mg total) by mouth at bedtime.    Marland Kitchen oxyCODONE-acetaminophen (PERCOCET/ROXICET) 5-325 MG tablet Take 1 tablet by mouth every 4 (four) hours as needed for severe pain. 60 tablet 0  . polyethylene glycol (MIRALAX / GLYCOLAX) packet Take 17 g by mouth daily.    . prochlorperazine (COMPAZINE) 10 MG tablet Take 1 tablet (10 mg total) by mouth every 6 (six) hours as needed for nausea or vomiting. (Patient not taking: Reported on 11/07/2016) 30 tablet 1  . senna-docusate (SENOKOT-S) 8.6-50 MG per tablet Take 1 tablet by mouth daily.    . SUMAtriptan (IMITREX) 25 MG tablet     . tamsulosin (FLOMAX) 0.4 MG  CAPS capsule     . triamcinolone ointment (KENALOG) 0.1 % Apply to arms twice a day until clear, then stop.    [name] 40 MG capsule      No current facility-administered medications for this visit.     SURGICAL HISTORY:  Past Surgical History:  Procedure Laterality Date  . APPENDECTOMY    . VIDEO BRONCHOSCOPY N/A 09/21/2014   Procedure: VIDEO BRONCHOSCOPY WITH FLUORO;  Surgeon: Rigoberto Noel, MD;  Location: [facility];  Service: Cardiopulmonary;  Laterality: N/A;  . VIDEO BRONCHOSCOPY WITH ENDOBRONCHIAL  ULTRASOUND N/A 09/23/2014   Procedure: VIDEO BRONCHOSCOPY WITH ENDOBRONCHIAL ULTRASOUND;  Surgeon: Rigoberto Noel, MD;  Location: [facility];  Service: Pulmonary;  Laterality: N/A;    REVIEW OF SYSTEMS:  Constitutional: negative Eyes: negative Ears, nose, mouth, throat, and face: negative Respiratory: positive for dyspnea on exertion Cardiovascular: negative Gastrointestinal: negative Genitourinary:negative Integument/breast: positive for pruritus and rash Hematologic/lymphatic: negative Musculoskeletal:negative Neurological: negative Behavioral/Psych: negative Endocrine: negative Allergic/Immunologic: negative   PHYSICAL EXAMINATION: General appearance: alert, cooperative and no distress Head: Normocephalic, without obvious abnormality, atraumatic Neck: no adenopathy, no JVD, supple, symmetrical, trachea midline and thyroid not enlarged, symmetric, no tenderness/mass/nodules Lymph nodes: Cervical, supraclavicular, and axillary nodes normal. Resp: clear to auscultation bilaterally Back: symmetric, no curvature. ROM normal. No CVA tenderness. Cardio: regular rate and rhythm, S1, S2 normal, no murmur, click, rub or gallop GI: soft, non-tender; bowel sounds normal; no masses,  no organomegaly Extremities: extremities normal, atraumatic, no cyanosis or edema Neurologic: Alert and oriented X 3, normal strength and tone. Normal symmetric reflexes. Normal coordination and gait      ECOG PERFORMANCE STATUS: 1 - Symptomatic but completely ambulatory  Blood pressure 120/80, pulse (!) 106, temperature 97.6 F (36.4 C), temperature source Oral, resp. rate 19, weight 161 lb 6.4 oz (73.2 kg), SpO2 100 %.  LABORATORY DATA: Lab Results  Component Value Date   WBC 16.2 (H) 11/21/2016   HGB 12.0 (L) 11/21/2016   HCT 36.0 (L) 11/21/2016   MCV 90.7 11/21/2016   PLT 497 (H) 11/21/2016      Chemistry      Component Value Date/Time   NA 138 11/07/2016 0746   K 5.0 11/07/2016 0746   CL 108  10/25/2014 0845   CO2 15 (L) 11/07/2016 0746   BUN 45.7 (H) 11/07/2016 0746   CREATININE 2.5 (H) 11/07/2016 0746      Component Value Date/Time   CALCIUM 9.2 11/07/2016 0746   ALKPHOS 152 (H) 11/07/2016 0746   AST 12 11/07/2016 0746   ALT 13 11/07/2016 0746   BILITOT 0.24 11/07/2016 0746       RADIOGRAPHIC STUDIES: Ct Abdomen Pelvis Wo Contrast  Result Date: 10/24/2016 CLINICAL DATA:  Non-small cell lung cancer, chemotherapy in progress. History of prostate cancer. EXAM: CT CHEST, ABDOMEN AND PELVIS WITHOUT CONTRAST TECHNIQUE: Multidetector CT imaging of the chest, abdomen and pelvis was performed following the standard protocol without IV contrast. COMPARISON:  08/02/2016 and 05/24/2016. FINDINGS: CT CHEST FINDINGS Cardiovascular: Right IJ Port-A-Cath terminates in the right atrium. Atherosclerotic calcification of the arterial vasculature, including left anterior descending coronary artery. Heart size normal. No pericardial effusion. Mediastinum/Nodes: Partially calcified right paratracheal mass measures 3.2 x 5.0 cm, previously 3.0 x 4.4 cm when remeasured. There are calcified right hilar lymph nodes. Hilar regions are otherwise difficult to evaluate without IV contrast. Subcarinal lymph node measures 1.6 cm, stable. No axillary adenopathy. Esophagus is grossly unremarkable. Lungs/Pleura: Pleural thickening is seen in the hemithoraces bilaterally.  Biapical pleuroparenchymal scarring. Centrilobular and paraseptal emphysema. Scattered pulmonary nodules measure up to 7 mm bilaterally, stable from 05/24/2016. No pleural fluid. Airway is unremarkable. Musculoskeletal: Sclerotic lesions are seen throughout the visualized osseous structures, as before. CT ABDOMEN PELVIS FINDINGS Hepatobiliary: Subcentimeter low-attenuation lesion in the left hepatic lobe is unchanged and likely a cyst. Definitive characterization is limited without IV contrast and due to size. Liver and gallbladder are otherwise  unremarkable. There may be mild dilatation of the lower common bile duct versus a low-attenuation lesion in the head of the pancreas, unchanged. Pancreas: Calcification is seen in the head of the pancreas. There is dilatation of the lower common bile duct versus a discrete low-attenuation lesion, unchanged. Pancreas is otherwise unremarkable. Duct is visualized but is not dilated. Spleen: Negative. Adrenals/Urinary Tract: Adrenal glands and right kidney are unremarkable. Left kidney is somewhat atrophic. Right ureter is duplicated and appears to join distally. Ureters are decompressed. Bladder is low in volume. Stomach/Bowel: Stomach, small bowel, appendix and colon are unremarkable. Vascular/Lymphatic: Atherosclerotic calcification of the arterial vasculature without abdominal aortic aneurysm. No pathologically enlarged lymph nodes. Reproductive: Prostate is visualized. Other: Small bilateral inguinal hernias contain fat. No free fluid. Mesenteries and peritoneum are unremarkable. Musculoskeletal: Sclerotic lesions are seen throughout the visualized osseous structures. Avascular necrosis and collapse of both femoral heads with secondary degenerative changes. IMPRESSION: 1. Scattered pulmonary nodules and mediastinal adenopathy are stable. 2. Osseous metastatic disease, grossly stable. 3. Aortic atherosclerosis (ICD10-170.0). Left anterior descending coronary artery calcification. 4. Chronic calcific pancreatitis. 5.  Emphysema (ICD10-J43.9). 6. Avascular necrosis involving both femoral heads with collapse and secondary degenerative changes. Electronically Signed   By: Lorin Picket M.D.   On: 10/24/2016 10:22   Ct Chest Wo Contrast  Result Date: 10/24/2016 CLINICAL DATA:  Non-small cell lung cancer, chemotherapy in progress. History of prostate cancer. EXAM: CT CHEST, ABDOMEN AND PELVIS WITHOUT CONTRAST TECHNIQUE: Multidetector CT imaging of the chest, abdomen and pelvis was performed following the standard  protocol without IV contrast. COMPARISON:  08/02/2016 and 05/24/2016. FINDINGS: CT CHEST FINDINGS Cardiovascular: Right IJ Port-A-Cath terminates in the right atrium. Atherosclerotic calcification of the arterial vasculature, including left anterior descending coronary artery. Heart size normal. No pericardial effusion. Mediastinum/Nodes: Partially calcified right paratracheal mass measures 3.2 x 5.0 cm, previously 3.0 x 4.4 cm when remeasured. There are calcified right hilar lymph nodes. Hilar regions are otherwise difficult to evaluate without IV contrast. Subcarinal lymph node measures 1.6 cm, stable. No axillary adenopathy. Esophagus is grossly unremarkable. Lungs/Pleura: Pleural thickening is seen in the hemithoraces bilaterally. Biapical pleuroparenchymal scarring. Centrilobular and paraseptal emphysema. Scattered pulmonary nodules measure up to 7 mm bilaterally, stable from 05/24/2016. No pleural fluid. Airway is unremarkable. Musculoskeletal: Sclerotic lesions are seen throughout the visualized osseous structures, as before. CT ABDOMEN PELVIS FINDINGS Hepatobiliary: Subcentimeter low-attenuation lesion in the left hepatic lobe is unchanged and likely a cyst. Definitive characterization is limited without IV contrast and due to size. Liver and gallbladder are otherwise unremarkable. There may be mild dilatation of the lower common bile duct versus a low-attenuation lesion in the head of the pancreas, unchanged. Pancreas: Calcification is seen in the head of the pancreas. There is dilatation of the lower common bile duct versus a discrete low-attenuation lesion, unchanged. Pancreas is otherwise unremarkable. Duct is visualized but is not dilated. Spleen: Negative. Adrenals/Urinary Tract: Adrenal glands and right kidney are unremarkable. Left kidney is somewhat atrophic. Right ureter is duplicated and appears to join distally. Ureters are decompressed. Bladder is low  in volume. Stomach/Bowel: Stomach, small  bowel, appendix and colon are unremarkable. Vascular/Lymphatic: Atherosclerotic calcification of the arterial vasculature without abdominal aortic aneurysm. No pathologically enlarged lymph nodes. Reproductive: Prostate is visualized. Other: Small bilateral inguinal hernias contain fat. No free fluid. Mesenteries and peritoneum are unremarkable. Musculoskeletal: Sclerotic lesions are seen throughout the visualized osseous structures. Avascular necrosis and collapse of both femoral heads with secondary degenerative changes. IMPRESSION: 1. Scattered pulmonary nodules and mediastinal adenopathy are stable. 2. Osseous metastatic disease, grossly stable. 3. Aortic atherosclerosis (ICD10-170.0). Left anterior descending coronary artery calcification. 4. Chronic calcific pancreatitis. 5.  Emphysema (ICD10-J43.9). 6. Avascular necrosis involving both femoral heads with collapse and secondary degenerative changes. Electronically Signed   By: Lorin Picket M.D.   On: 10/24/2016 10:22   ASSESSMENT AND PLAN:  This is a very pleasant 56 years old African-American male with metastatic non-small cell lung cancer, adenocarcinoma status post systemic chemotherapy with carboplatin and Alimta and currently on second line treatment with immunotherapy with Nivolumab status post 41 cycles. The patient has been tolerating his treatment with Nivolumab fairly well but unfortunately recently she developed significant skin rash with macules and fluid-filled cystic lesions on both elbows more on the right side. This is likely secondary to his treatment with Nivolumab. I recommended for the patient to hold his treatment for now. He will come back for follow-up visit in 2 weeks for evaluation before resuming his treatment with immunotherapy. For the skin rash, I will start the patient on Medrol Dosepak to see if he would response to this treatment. For the history of prostate cancer, he will continue his current treatment with  Enzalutamide. For pain management, he was given a refill of Percocet today. For COPD, he will continue his current treatment with inhalers. The patient was advised to call immediately if he has any concerning symptoms in the interval. The patient voices understanding of current disease status and treatment options and is in agreement with the current care plan. All questions were answered. The patient knows to call the clinic with any problems, questions or concerns. We can certainly see the patient much sooner if necessary.  Disclaimer: This note was dictated with voice recognition software. Similar sounding words can inadvertently be transcribed and may not be corrected upon review.

## 2016-11-21 NOTE — Progress Notes (Signed)
Tx cancelled for today. See Dr Worthy Flank note.

## 2016-11-21 NOTE — Patient Instructions (Signed)
[facility] Discharge Instructions for Patients Receiving Chemotherapy  Today you received the following chemotherapy agents nivolumab   To help prevent nausea and vomiting after your treatment, we encourage you to take your nausea medication as directed   If you develop nausea and vomiting that is not controlled by your nausea medication, call the clinic.   BELOW ARE SYMPTOMS THAT SHOULD BE REPORTED IMMEDIATELY:  *FEVER GREATER THAN 100.5 F  *CHILLS WITH OR WITHOUT FEVER  NAUSEA AND VOMITING THAT IS NOT CONTROLLED WITH YOUR NAUSEA MEDICATION  *UNUSUAL SHORTNESS OF BREATH  *UNUSUAL BRUISING OR BLEEDING  TENDERNESS IN MOUTH AND THROAT WITH OR WITHOUT PRESENCE OF ULCERS  *URINARY PROBLEMS  *BOWEL PROBLEMS  UNUSUAL RASH Items with * indicate a potential emergency and should be followed up as soon as possible.  Feel free to call the clinic you have any questions or concerns. The clinic phone number is [phone].  

## 2016-11-22 ENCOUNTER — Telehealth: Payer: Self-pay | Admitting: Internal Medicine

## 2016-11-22 NOTE — Telephone Encounter (Signed)
Per 11/21/2016 los. Patient already had appts scheduled - no additional appts needed.

## 2016-12-05 ENCOUNTER — Encounter: Payer: Self-pay | Admitting: Internal Medicine

## 2016-12-05 ENCOUNTER — Other Ambulatory Visit (HOSPITAL_BASED_OUTPATIENT_CLINIC_OR_DEPARTMENT_OTHER): Payer: Medicare Other

## 2016-12-05 ENCOUNTER — Ambulatory Visit (HOSPITAL_BASED_OUTPATIENT_CLINIC_OR_DEPARTMENT_OTHER): Payer: Medicare Other | Admitting: Internal Medicine

## 2016-12-05 ENCOUNTER — Ambulatory Visit: Payer: Medicare Other

## 2016-12-05 ENCOUNTER — Telehealth: Payer: Self-pay | Admitting: Internal Medicine

## 2016-12-05 DIAGNOSIS — J449 Chronic obstructive pulmonary disease, unspecified: Secondary | ICD-10-CM

## 2016-12-05 DIAGNOSIS — C7951 Secondary malignant neoplasm of bone: Secondary | ICD-10-CM

## 2016-12-05 DIAGNOSIS — C61 Malignant neoplasm of prostate: Secondary | ICD-10-CM | POA: Diagnosis not present

## 2016-12-05 DIAGNOSIS — R5382 Chronic fatigue, unspecified: Secondary | ICD-10-CM

## 2016-12-05 DIAGNOSIS — C3401 Malignant neoplasm of right main bronchus: Secondary | ICD-10-CM

## 2016-12-05 DIAGNOSIS — C3491 Malignant neoplasm of unspecified part of right bronchus or lung: Secondary | ICD-10-CM

## 2016-12-05 DIAGNOSIS — R21 Rash and other nonspecific skin eruption: Secondary | ICD-10-CM

## 2016-12-05 DIAGNOSIS — G893 Neoplasm related pain (acute) (chronic): Secondary | ICD-10-CM

## 2016-12-05 DIAGNOSIS — Z79899 Other long term (current) drug therapy: Secondary | ICD-10-CM | POA: Diagnosis not present

## 2016-12-05 DIAGNOSIS — C801 Malignant (primary) neoplasm, unspecified: Principal | ICD-10-CM

## 2016-12-05 LAB — CBC WITH DIFFERENTIAL/PLATELET
BASO%: 0.5 % (ref 0.0–2.0)
Basophils Absolute: 0.1 10*3/uL (ref 0.0–0.1)
EOS%: 8.5 % — ABNORMAL HIGH (ref 0.0–7.0)
Eosinophils Absolute: 1.4 10*3/uL — ABNORMAL HIGH (ref 0.0–0.5)
HCT: 36.9 % — ABNORMAL LOW (ref 38.4–49.9)
HGB: 12.3 g/dL — ABNORMAL LOW (ref 13.0–17.1)
LYMPH%: 16.2 % (ref 14.0–49.0)
MCH: 29.9 pg (ref 27.2–33.4)
MCHC: 33.3 g/dL (ref 32.0–36.0)
MCV: 89.6 fL (ref 79.3–98.0)
MONO#: 1.2 10*3/uL — ABNORMAL HIGH (ref 0.1–0.9)
MONO%: 7.2 % (ref 0.0–14.0)
NEUT#: 11 10*3/uL — ABNORMAL HIGH (ref 1.5–6.5)
NEUT%: 67.6 % (ref 39.0–75.0)
Platelets: 308 10*3/uL (ref 140–400)
RBC: 4.12 10*6/uL — ABNORMAL LOW (ref 4.20–5.82)
RDW: 15.5 % — ABNORMAL HIGH (ref 11.0–14.6)
WBC: 16.2 10*3/uL — ABNORMAL HIGH (ref 4.0–10.3)
lymph#: 2.6 10*3/uL (ref 0.9–3.3)

## 2016-12-05 LAB — COMPREHENSIVE METABOLIC PANEL
ALT: 6 U/L (ref 0–55)
AST: 13 U/L (ref 5–34)
Albumin: 2.6 g/dL — ABNORMAL LOW (ref 3.5–5.0)
Alkaline Phosphatase: 189 U/L — ABNORMAL HIGH (ref 40–150)
Anion Gap: 10 mEq/L (ref 3–11)
BUN: 35.4 mg/dL — AB (ref 7.0–26.0)
CHLORIDE: 112 meq/L — AB (ref 98–109)
CO2: 18 meq/L — AB (ref 22–29)
Calcium: 9.6 mg/dL (ref 8.4–10.4)
Creatinine: 2 mg/dL — ABNORMAL HIGH (ref 0.7–1.3)
EGFR: 41 mL/min/{1.73_m2} — ABNORMAL LOW (ref >90)
GLUCOSE: 79 mg/dL (ref 70–140)
POTASSIUM: 4.9 meq/L (ref 3.5–5.1)
SODIUM: 141 meq/L (ref 136–145)
Total Bilirubin: 0.43 mg/dL (ref 0.20–1.20)
Total Protein: 7.5 g/dL (ref 6.4–8.3)

## 2016-12-05 LAB — TSH: TSH: 2.824 m(IU)/L (ref 0.320–4.118)

## 2016-12-05 MED ORDER — PREDNISONE 20 MG PO TABS: ORAL_TABLET | ORAL | 0 refills | Status: DC

## 2016-12-05 MED ORDER — OXYCODONE-ACETAMINOPHEN 5-325 MG PO TABS: 1.0000 | ORAL_TABLET | ORAL | 0 refills | Status: DC | PRN

## 2016-12-05 NOTE — Telephone Encounter (Signed)
Appointments scheduled per 4.18.18 LOS. Patient given AVS report and calendars with future scheduled appointments. °

## 2016-12-05 NOTE — Progress Notes (Signed)
[place] Telephone:(336) [phone]   Fax:([phone]  OFFICE PROGRESS NOTE  [name], [name], MD [place] [address]  DIAGNOSIS:  1) Stage IV (T2b, N2, M1b) non-small cell lung cancer, adenocarcinoma with negative EGFR mutation and negative gene translocation presented with a large right hilar mass as well as mediastinal lymphadenopathy and metastatic disease to the liver, bone and pancreas diagnosed in February 2016. 2) prostate adenocarcinoma diagnosed at [facility] with Gleason score 9 (4+5).  PRIOR THERAPY: Systemic chemotherapy with carboplatin for AUC of 5 and Alimta 500 MG/M2 every 3 weeks. Status post 6 cycles.  CURRENT THERAPY:  1) Immunotherapy with Nivolumab 240 MG every 2 weeks, status post 41 cycles. Treatment is currently on hold secondary to significant skin rash. 2) Enzalutamide for prostate cancer at [facility]: Tremell Reimers 56 y.o. male returns to the clinic today for follow-up visit accompanied by his brother-in-law. The patient continues to complain of fatigue and low back pain. He also has significant skin rash at the elbow area as well as new ones on the dorsum of his hands. It is drying after he was started on Medrol Dosepak 2 weeks ago. He denied having any current chest pain, shortness breath except with exertion was no cough or hemoptysis. He has no significant weight loss or night sweats. He has no nausea, vomiting, diarrhea or constipation. His treatment with immunotherapy has been on hold until improvement of the skin rash. The patient is here today for evaluation.   MEDICAL HISTORY: Past Medical History:  Diagnosis Date  . Chronic fatigue 04/12/2016  . Chronic pain 04/12/2016  . Shortness of breath dyspnea     ALLERGIES:  has No Known Allergies.  MEDICATIONS:  Current Outpatient Prescriptions  Medication Sig Dispense Refill  . acetaminophen (TYLENOL) 325 MG tablet Take 2  tablets (650 mg total) by mouth every 6 (six) hours as needed for mild pain (or Fever >/= 101).    Marland Kitchen albuterol (PROVENTIL) (2.5 MG/3ML) 0.083% nebulizer solution Take 3 mLs (2.5 mg total) by nebulization every 2 (two) hours as needed for wheezing. 75 mL 12  . AMITIZA 24 MCG capsule     . bicalutamide (CASODEX) 50 MG tablet Take 50 mg by mouth.    . diphenhydrAMINE (BENADRYL) 25 MG tablet Take 25 mg by mouth.    . feeding supplement, ENSURE COMPLETE, (ENSURE COMPLETE) LIQD Take 237 mLs by mouth 3 (three) times daily between meals. (Patient not taking: Reported on 11/07/2016)    . FLUOCINOLONE ACETONIDE BODY 0.01 % OIL     . levocetirizine (XYZAL) 5 MG tablet Take 5 mg by mouth.    . lidocaine-prilocaine (EMLA) cream Apply 1 application topically as needed. Apply to port site prior to chemotherapy. 30 g 0  . methylPREDNISolone (MEDROL DOSEPAK) 4 MG TBPK tablet Use as instructed 21 tablet 0  . mirtazapine (REMERON) 15 MG tablet Take 1 tablet (15 mg total) by mouth at bedtime.    Marland Kitchen oxyCODONE-acetaminophen (PERCOCET/ROXICET) 5-325 MG tablet Take 1 tablet by mouth every 4 (four) hours as needed for severe pain. 60 tablet 0  . polyethylene glycol (MIRALAX / GLYCOLAX) packet Take 17 g by mouth daily.    . prochlorperazine (COMPAZINE) 10 MG tablet Take 1 tablet (10 mg total) by mouth every 6 (six) hours as needed for nausea or vomiting. (Patient not taking: Reported on 11/07/2016) 30 tablet 1  . senna-docusate (SENOKOT-S) 8.6-50 MG  per tablet Take 1 tablet by mouth daily.    . SUMAtriptan (IMITREX) 25 MG tablet     . tamsulosin (FLOMAX) 0.4 MG CAPS capsule     . triamcinolone ointment (KENALOG) 0.1 % Apply to arms twice a day until clear, then stop.    [name] 40 MG capsule      No current facility-administered medications for this visit.     SURGICAL HISTORY:  Past Surgical History:  Procedure Laterality Date  . APPENDECTOMY    . VIDEO BRONCHOSCOPY N/A 09/21/2014   Procedure: VIDEO BRONCHOSCOPY WITH  FLUORO;  Surgeon: Rigoberto Noel, MD;  Location: [facility];  Service: Cardiopulmonary;  Laterality: N/A;  . VIDEO BRONCHOSCOPY WITH ENDOBRONCHIAL ULTRASOUND N/A 09/23/2014   Procedure: VIDEO BRONCHOSCOPY WITH ENDOBRONCHIAL ULTRASOUND;  Surgeon: Rigoberto Noel, MD;  Location: [facility];  Service: Pulmonary;  Laterality: N/A;    REVIEW OF SYSTEMS:  Constitutional: positive for fatigue Eyes: negative Ears, nose, mouth, throat, and face: negative Respiratory: positive for dyspnea on exertion Cardiovascular: negative Gastrointestinal: negative Genitourinary:negative Integument/breast: positive for pruritus, rash and skin lesion(s) Hematologic/lymphatic: negative Musculoskeletal:negative Neurological: negative Behavioral/Psych: negative Endocrine: negative Allergic/Immunologic: negative   PHYSICAL EXAMINATION: General appearance: alert, cooperative, fatigued and no distress Head: Normocephalic, without obvious abnormality, atraumatic Neck: no adenopathy, no JVD, supple, symmetrical, trachea midline and thyroid not enlarged, symmetric, no tenderness/mass/nodules Lymph nodes: Cervical, supraclavicular, and axillary nodes normal. Resp: clear to auscultation bilaterally Back: symmetric, no curvature. ROM normal. No CVA tenderness. Cardio: regular rate and rhythm, S1, S2 normal, no murmur, click, rub or gallop GI: soft, non-tender; bowel sounds normal; no masses,  no organomegaly Extremities: extremities normal, atraumatic, no cyanosis or edema Neurologic: Alert and oriented X 3, normal strength and tone. Normal symmetric reflexes. Normal coordination and gait   11/21/16   12/05/16 8    ECOG PERFORMANCE STATUS: 1 - Symptomatic but completely ambulatory  Blood pressure (!) 91/49, pulse (!) 117, temperature 98 F (36.7 C), temperature source Oral, resp. rate 18, height 5' 9"  (1.753 m), weight 160 lb 6.4 oz (72.8 kg), SpO2 100 %.  LABORATORY DATA: Lab Results  Component Value Date   WBC 16.2  (H) 12/05/2016   HGB 12.3 (L) 12/05/2016   HCT 36.9 (L) 12/05/2016   MCV 89.6 12/05/2016   PLT 308 12/05/2016      Chemistry      Component Value Date/Time   NA 141 12/05/2016 1042   K 4.9 12/05/2016 1042   CL 108 10/25/2014 0845   CO2 18 (L) 12/05/2016 1042   BUN 35.4 (H) 12/05/2016 1042   CREATININE 2.0 (H) 12/05/2016 1042      Component Value Date/Time   CALCIUM 9.6 12/05/2016 1042   ALKPHOS 189 (H) 12/05/2016 1042   AST 13 12/05/2016 1042   ALT 6 12/05/2016 1042   BILITOT 0.43 12/05/2016 1042       RADIOGRAPHIC STUDIES: No results found. ASSESSMENT AND PLAN:  This is a very pleasant 56 years old African-American male with metastatic non-small cell lung cancer, adenocarcinoma status post systemic chemotherapy with carboplatin and Alimta. He is currently on second line treatment with immunotherapy with Nivolumab status post 41 cycles. He has been tolerating his treatment well except for the new development of significant skin rash in both elbows and now the dorsum of the hands. I had a lengthy discussion with the patient and his brother-in-law about his condition. I recommended for him to continue holding his treatment with Nivolumab for now until improvement of  the skin rash. I started the patient on a taper dose of prednisone 60 mg by mouth daily for 5 days followed by 40 mg by mouth daily for 5 days followed by 20 mg by mouth daily for 5 days. I will see him back for follow-up visit in 2 weeks for evaluation and see if his rash improved to the point to resume his treatment with immunotherapy. For pain management, he was given a refill of Percocet today. For COPD, the patient will continue his current treatment with the inhaler. For the history of prostate adenocarcinoma, he will continue his current treatment with Enzalutamide. He was advised to call immediately if he has any concerning symptoms in the interval. The patient voices understanding of current disease status  and treatment options and is in agreement with the current care plan. All questions were answered. The patient knows to call the clinic with any problems, questions or concerns. We can certainly see the patient much sooner if necessary.  Disclaimer: This note was dictated with voice recognition software. Similar sounding words can inadvertently be transcribed and may not be corrected upon review.

## 2016-12-18 ENCOUNTER — Telehealth: Payer: Self-pay | Admitting: Internal Medicine

## 2016-12-18 ENCOUNTER — Observation Stay
Admission: EM | Admit: 2016-12-18 | Payer: Self-pay | Source: Other Acute Inpatient Hospital | Admitting: Internal Medicine

## 2016-12-18 NOTE — Telephone Encounter (Signed)
Called for possible patient transfer: Patient [facility], patient with generalized weakness, and chills.  No chest pain, patient with trop of 0.1 and repeat of 0.09.  EDP wants to admit patient for this.  I have advised against transfer, but wont refuse patient.  Will accept patient in an [facility] transfer if patient refuses obs at [facility], but feel that risks of transfer outweigh any minimal benefit for observing patient with elevated troponin in setting of no chest pain.  Tele, obs status if he does come.

## 2016-12-19 ENCOUNTER — Other Ambulatory Visit (HOSPITAL_BASED_OUTPATIENT_CLINIC_OR_DEPARTMENT_OTHER): Payer: Medicare Other

## 2016-12-19 ENCOUNTER — Telehealth: Payer: Self-pay | Admitting: Medical Oncology

## 2016-12-19 ENCOUNTER — Encounter: Payer: Self-pay | Admitting: Internal Medicine

## 2016-12-19 ENCOUNTER — Ambulatory Visit (HOSPITAL_BASED_OUTPATIENT_CLINIC_OR_DEPARTMENT_OTHER): Payer: Medicare Other | Admitting: Internal Medicine

## 2016-12-19 ENCOUNTER — Ambulatory Visit (HOSPITAL_BASED_OUTPATIENT_CLINIC_OR_DEPARTMENT_OTHER): Payer: Medicare Other

## 2016-12-19 VITALS — BP 130/93 | HR 106 | Temp 97.8°F | Resp 16 | Ht 69.0 in | Wt 161.1 lb

## 2016-12-19 DIAGNOSIS — G893 Neoplasm related pain (acute) (chronic): Secondary | ICD-10-CM

## 2016-12-19 DIAGNOSIS — Z8546 Personal history of malignant neoplasm of prostate: Secondary | ICD-10-CM | POA: Diagnosis not present

## 2016-12-19 DIAGNOSIS — C7951 Secondary malignant neoplasm of bone: Secondary | ICD-10-CM

## 2016-12-19 DIAGNOSIS — C3401 Malignant neoplasm of right main bronchus: Secondary | ICD-10-CM

## 2016-12-19 DIAGNOSIS — C7889 Secondary malignant neoplasm of other digestive organs: Secondary | ICD-10-CM

## 2016-12-19 DIAGNOSIS — C801 Malignant (primary) neoplasm, unspecified: Secondary | ICD-10-CM

## 2016-12-19 DIAGNOSIS — R0602 Shortness of breath: Secondary | ICD-10-CM

## 2016-12-19 DIAGNOSIS — Z5112 Encounter for antineoplastic immunotherapy: Secondary | ICD-10-CM

## 2016-12-19 DIAGNOSIS — R5382 Chronic fatigue, unspecified: Secondary | ICD-10-CM

## 2016-12-19 DIAGNOSIS — C787 Secondary malignant neoplasm of liver and intrahepatic bile duct: Secondary | ICD-10-CM

## 2016-12-19 DIAGNOSIS — C3491 Malignant neoplasm of unspecified part of right bronchus or lung: Secondary | ICD-10-CM

## 2016-12-19 LAB — CBC WITH DIFFERENTIAL/PLATELET
BASO%: 0.4 % (ref 0.0–2.0)
Basophils Absolute: 0.1 10*3/uL (ref 0.0–0.1)
EOS ABS: 0.4 10*3/uL (ref 0.0–0.5)
EOS%: 3.2 % (ref 0.0–7.0)
HEMATOCRIT: 34.5 % — AB (ref 38.4–49.9)
HEMOGLOBIN: 11.6 g/dL — AB (ref 13.0–17.1)
LYMPH%: 13 % — ABNORMAL LOW (ref 14.0–49.0)
MCH: 29.9 pg (ref 27.2–33.4)
MCHC: 33.6 g/dL (ref 32.0–36.0)
MCV: 88.9 fL (ref 79.3–98.0)
MONO#: 0.8 10*3/uL (ref 0.1–0.9)
MONO%: 6.2 % (ref 0.0–14.0)
NEUT%: 77.2 % — ABNORMAL HIGH (ref 39.0–75.0)
NEUTROS ABS: 10.3 10*3/uL — AB (ref 1.5–6.5)
Platelets: 374 10*3/uL (ref 140–400)
RBC: 3.88 10*6/uL — ABNORMAL LOW (ref 4.20–5.82)
RDW: 15.2 % — ABNORMAL HIGH (ref 11.0–14.6)
WBC: 13.3 10*3/uL — AB (ref 4.0–10.3)
lymph#: 1.7 10*3/uL (ref 0.9–3.3)

## 2016-12-19 LAB — COMPREHENSIVE METABOLIC PANEL
ALT: 8 U/L (ref 0–55)
AST: 11 U/L (ref 5–34)
Albumin: 2.4 g/dL — ABNORMAL LOW (ref 3.5–5.0)
Alkaline Phosphatase: 172 U/L — ABNORMAL HIGH (ref 40–150)
Anion Gap: 10 mEq/L (ref 3–11)
BUN: 35.4 mg/dL — ABNORMAL HIGH (ref 7.0–26.0)
CO2: 18 meq/L — AB (ref 22–29)
Calcium: 9 mg/dL (ref 8.4–10.4)
Chloride: 108 mEq/L (ref 98–109)
Creatinine: 1.8 mg/dL — ABNORMAL HIGH (ref 0.7–1.3)
EGFR: 48 mL/min/{1.73_m2} — AB (ref >90)
GLUCOSE: 114 mg/dL (ref 70–140)
POTASSIUM: 4.4 meq/L (ref 3.5–5.1)
SODIUM: 136 meq/L (ref 136–145)
TOTAL PROTEIN: 6.5 g/dL (ref 6.4–8.3)
Total Bilirubin: 0.23 mg/dL (ref 0.20–1.20)

## 2016-12-19 MED ORDER — SODIUM CHLORIDE 0.9 % IV SOLN
Freq: Once | INTRAVENOUS | Status: AC
  Administered 2016-12-19: 10:00:00 via INTRAVENOUS

## 2016-12-19 MED ORDER — MIRTAZAPINE 15 MG PO TABS: 15.0000 mg | ORAL_TABLET | Freq: Every day | ORAL | Status: AC

## 2016-12-19 MED ORDER — SODIUM CHLORIDE 0.9 % IJ SOLN
10.0000 mL | INTRAMUSCULAR | Status: DC | PRN
  Administered 2016-12-19: 10 mL
  Filled 2016-12-19: qty 10

## 2016-12-19 MED ORDER — HEPARIN SOD (PORK) LOCK FLUSH 100 UNIT/ML IV SOLN
500.0000 [IU] | Freq: Once | INTRAVENOUS | Status: AC | PRN
  Administered 2016-12-19: 500 [IU]
  Filled 2016-12-19: qty 5

## 2016-12-19 MED ORDER — OXYCODONE-ACETAMINOPHEN 5-325 MG PO TABS: 1.0000 | ORAL_TABLET | ORAL | 0 refills | Status: DC | PRN

## 2016-12-19 MED ORDER — NIVOLUMAB CHEMO INJECTION 100 MG/10ML
240.0000 mg | Freq: Once | INTRAVENOUS | Status: AC
  Administered 2016-12-19: 240 mg via INTRAVENOUS
  Filled 2016-12-19: qty 24

## 2016-12-19 NOTE — Patient Instructions (Signed)
Cancer Center Discharge Instructions for Patients Receiving Chemotherapy  Today you received the following chemotherapy agents:  Nivolumab.  To help prevent nausea and vomiting after your treatment, we encourage you to take your nausea medication as directed.   If you develop nausea and vomiting that is not controlled by your nausea medication, call the clinic.   BELOW ARE SYMPTOMS THAT SHOULD BE REPORTED IMMEDIATELY:  *FEVER GREATER THAN 100.5 F  *CHILLS WITH OR WITHOUT FEVER  NAUSEA AND VOMITING THAT IS NOT CONTROLLED WITH YOUR NAUSEA MEDICATION  *UNUSUAL SHORTNESS OF BREATH  *UNUSUAL BRUISING OR BLEEDING  TENDERNESS IN MOUTH AND THROAT WITH OR WITHOUT PRESENCE OF ULCERS  *URINARY PROBLEMS  *BOWEL PROBLEMS  UNUSUAL RASH Items with * indicate a potential emergency and should be followed up as soon as possible.  Feel free to call the clinic you have any questions or concerns. The clinic phone number is [phone].  Please show the CHEMO ALERT CARD at check-in to the Emergency Department and triage nurse.   

## 2016-12-19 NOTE — Progress Notes (Signed)
[place] [phone]   [phone]  OFFICE PROGRESS NOTE  [name], [name], MD [place] [address]  DIAGNOSIS:  1) Stage IV (T2b, N2, M1b) non-small cell lung cancer, adenocarcinoma with negative EGFR mutation and negative gene translocation presented with a large right hilar mass as well as mediastinal lymphadenopathy and metastatic disease to the liver, bone and pancreas diagnosed in February 2016. 2) prostate adenocarcinoma diagnosed at [facility] with Gleason score 9 (4+5).  PRIOR THERAPY: Systemic chemotherapy with carboplatin for AUC of 5 and Alimta 500 MG/M2 every 3 weeks. Status post 6 cycles.  CURRENT THERAPY:  1) Immunotherapy with Nivolumab 240 MG every 2 weeks, status post 41 cycles. Treatment is currently on hold secondary to significant skin rash. 2) Enzalutamide for prostate cancer at [place]: Francisco Love 56 y.o. male returns to the clinic today for follow-up visit accompanied by his brother-in-law. The patient is feeling much better today with no specific complaints except for occasional shortness breath. He was seen at the emergency department in [place], [place] last night with tachycardia and the patient was found to have dehydration. He was started on IV hydration overnight and felt much better in the morning. He denied having any current chest pain but has shortness breath with exertion was no cough or hemoptysis. He denied having any significant weight loss or night sweats. He has no nausea, vomiting, diarrhea or constipation. He has been on a tapered dose of prednisone over the last few weeks because of the significant skin rash. His skin rash has significantly improved. The patient is here today for reevaluation before resuming his treatment with immunotherapy.  MEDICAL HISTORY: Past Medical History:  Diagnosis Date  . Chronic fatigue 04/12/2016  . Chronic pain 04/12/2016    . Shortness of breath dyspnea     ALLERGIES:  has No Known Allergies.  MEDICATIONS:  Current Outpatient Prescriptions  Medication Sig Dispense Refill  . acetaminophen (TYLENOL) 325 MG tablet Take 2 tablets (650 mg total) by mouth every 6 (six) hours as needed for mild pain (or Fever >/= 101).    Marland Kitchen albuterol (PROVENTIL) (2.5 MG/3ML) 0.083% nebulizer solution Take 3 mLs (2.5 mg total) by nebulization every 2 (two) hours as needed for wheezing. 75 mL 12  . AMITIZA 24 MCG capsule     . bicalutamide (CASODEX) 50 MG tablet Take 50 mg by mouth.    . diphenhydrAMINE (BENADRYL) 25 MG tablet Take 25 mg by mouth.    . feeding supplement, ENSURE COMPLETE, (ENSURE COMPLETE) LIQD Take 237 mLs by mouth 3 (three) times daily between meals. (Patient not taking: Reported on 11/07/2016)    . FLUOCINOLONE ACETONIDE BODY 0.01 % OIL     . levocetirizine (XYZAL) 5 MG tablet Take 5 mg by mouth.    . lidocaine-prilocaine (EMLA) cream Apply 1 application topically as needed. Apply to port site prior to chemotherapy. 30 g 0  . methylPREDNISolone (MEDROL DOSEPAK) 4 MG TBPK tablet Use as instructed 21 tablet 0  . mirtazapine (REMERON) 15 MG tablet Take 1 tablet (15 mg total) by mouth at bedtime.    Marland Kitchen oxyCODONE-acetaminophen (PERCOCET/ROXICET) 5-325 MG tablet Take 1 tablet by mouth every 4 (four) hours as needed for severe pain. 60 tablet 0  . polyethylene glycol (MIRALAX / GLYCOLAX) packet Take 17 g by mouth daily.    . predniSONE (DELTASONE) 20 MG tablet 3 tab po qd x 5  days, 2 tab po qd x 5 days, one tab po qd x 5 days. 30 tablet 0  . prochlorperazine (COMPAZINE) 10 MG tablet Take 1 tablet (10 mg total) by mouth every 6 (six) hours as needed for nausea or vomiting. (Patient not taking: Reported on 11/07/2016) 30 tablet 1  . senna-docusate (SENOKOT-S) 8.6-50 MG per tablet Take 1 tablet by mouth daily.    . SUMAtriptan (IMITREX) 25 MG tablet     . tamsulosin (FLOMAX) 0.4 MG CAPS capsule     . triamcinolone ointment  (KENALOG) 0.1 % Apply to arms twice a day until clear, then stop.    [name] 40 MG capsule      No current facility-administered medications for this visit.     SURGICAL HISTORY:  Past Surgical History:  Procedure Laterality Date  . APPENDECTOMY    . VIDEO BRONCHOSCOPY N/A 09/21/2014   Procedure: VIDEO BRONCHOSCOPY WITH FLUORO;  Surgeon: Rigoberto Noel, MD;  Location: [place];  Service: Cardiopulmonary;  Laterality: N/A;  . VIDEO BRONCHOSCOPY WITH ENDOBRONCHIAL ULTRASOUND N/A 09/23/2014   Procedure: VIDEO BRONCHOSCOPY WITH ENDOBRONCHIAL ULTRASOUND;  Surgeon: Rigoberto Noel, MD;  Location: [place];  Service: Pulmonary;  Laterality: N/A;    REVIEW OF SYSTEMS:  Constitutional: positive for fatigue Eyes: negative Ears, nose, mouth, throat, and face: negative Respiratory: positive for dyspnea on exertion Cardiovascular: negative Gastrointestinal: negative Genitourinary:negative Integument/breast: negative Hematologic/lymphatic: negative Musculoskeletal:negative Neurological: negative Behavioral/Psych: negative Endocrine: negative Allergic/Immunologic: negative   PHYSICAL EXAMINATION: General appearance: alert, cooperative, fatigued and no distress Head: Normocephalic, without obvious abnormality, atraumatic Neck: no adenopathy, no JVD, supple, symmetrical, trachea midline and thyroid not enlarged, symmetric, no tenderness/mass/nodules Lymph nodes: Cervical, supraclavicular, and axillary nodes normal. Resp: clear to auscultation bilaterally Back: symmetric, no curvature. ROM normal. No CVA tenderness. Cardio: regular rate and rhythm, S1, S2 normal, no murmur, click, rub or gallop GI: soft, non-tender; bowel sounds normal; no masses,  no organomegaly Extremities: extremities normal, atraumatic, no cyanosis or edema Neurologic: Alert and oriented X 3, normal strength and tone. Normal symmetric reflexes. Normal coordination and gait   11/21/16   12/05/16   12/19/16 after prednisone  taper      ECOG PERFORMANCE STATUS: 1 - Symptomatic but completely ambulatory  Blood pressure (!) 130/93, pulse (!) 106, temperature 97.8 F (36.6 C), temperature source Oral, resp. rate 16, height 5' 9"  (1.753 m), weight 161 lb 1.6 oz (73.1 kg), SpO2 100 %.  LABORATORY DATA: Lab Results  Component Value Date   WBC 13.3 (H) 12/19/2016   HGB 11.6 (L) 12/19/2016   HCT 34.5 (L) 12/19/2016   MCV 88.9 12/19/2016   PLT 374 12/19/2016      Chemistry      Component Value Date/Time   NA 141 12/05/2016 1042   K 4.9 12/05/2016 1042   CL 108 10/25/2014 0845   CO2 18 (L) 12/05/2016 1042   BUN 35.4 (H) 12/05/2016 1042   CREATININE 2.0 (H) 12/05/2016 1042      Component Value Date/Time   CALCIUM 9.6 12/05/2016 1042   ALKPHOS 189 (H) 12/05/2016 1042   AST 13 12/05/2016 1042   ALT 6 12/05/2016 1042   BILITOT 0.43 12/05/2016 1042       RADIOGRAPHIC STUDIES: No results found. ASSESSMENT AND PLAN:  This is a very pleasant 56 years old African-American male with metastatic non-small cell lung cancer, adenocarcinoma status post induction systemic chemotherapy with carboplatin and Alimta and he is currently on second line treatment with immunotherapy with  Nivolumab. He is status post 41 cycles. His treatment has been on hold for the last 6 week secondary to significant skin rash. He was treated with a tapered dose of prednisone which was completed recently. His skin rash has significantly improved. I recommended for the patient to resume his treatment with Nivolumab plan he will start cycle #42 today. For pain management, I gave the patient refill of Percocet. For the history of prostatic adenocarcinoma, he will continue his treatment with Enzalutamide. For depression, I gave the patient refill of Remeron. He would come back for follow-up visit in 2 weeks for evaluation before starting cycle #43. The patient was advised to call immediately if he has any concerning symptoms in the  interval. The patient voices understanding of current disease status and treatment options and is in agreement with the current care plan. All questions were answered. The patient knows to call the clinic with any problems, questions or concerns. We can certainly see the patient much sooner if necessary.  Disclaimer: This note was dictated with voice recognition software. Similar sounding words can inadvertently be transcribed and may not be corrected upon review.

## 2016-12-19 NOTE — Telephone Encounter (Signed)
Called [facility] to verify orders.HE is not in their system .I told his brother to call me with name of oxygen company.

## 2016-12-19 NOTE — Telephone Encounter (Signed)
Called [employer] -pt is in their data  base and they will go out and fill tanks tomorrow on his weekly delivery day. I instructed pt to call them the day before on wed and tell htem how many empty tanks he has .  I also requested portable tank for him . Pt brother will call [employer] with his new address.

## 2016-12-19 NOTE — Progress Notes (Signed)
Dr. Julien Nordmann aware of today's creatinine.  Ok to treat.

## 2016-12-20 ENCOUNTER — Ambulatory Visit: Payer: Medicaid Other | Admitting: Internal Medicine

## 2016-12-20 NOTE — Progress Notes (Signed)
This is a no charge note  Transfer from [facility] per Dr. Alesia Morin  56 year old male with past medical history of metastasized NSCLC (currently on immunotherapy), CKD-III, tobacco abuse, depression, who presents with SOB, pleuritic chest pain, tachycardia with heart rate up to 100 -130. Oxygen sat 100% on room air, but desaturated to 80% on ambulation.   D-dimer positive at 0.70, negative troponin, WBC 13.1, BNP 230, temperature normal, currently oxygen saturation 98% on 2 L nasal cannula, stable renal function, CT chest without contrast showed no pneumonia, but showed small right-sided pleural effusion, central emphysematous change, mediastinal mass 4.5 cm. Blood pressure 136/82, heart rate 97, RR 29, temperature normal. Pt is accepted to tele bed as inpt. Pt is followed by Dr. Julien Nordmann, is currently on immunotherapy for NSCLC, after discussed with floor manager, will accept pt to [facility] hospital. I asked EDP to start IV heparin empirically for possible PE.  Ivor Costa, MD  [facility] Pager [phone]  If 7PM-7AM, please contact night-coverage [email or web address] Password [email or web address] 12/20/2016, 11:22 PM \

## 2016-12-21 ENCOUNTER — Other Ambulatory Visit (HOSPITAL_COMMUNITY): Payer: Medicare Other

## 2016-12-21 ENCOUNTER — Inpatient Hospital Stay (HOSPITAL_COMMUNITY)
Admission: AD | Admit: 2016-12-21 | Discharge: 2016-12-24 | DRG: 189 | Disposition: A | Discharge status: Home / Self Care | Payer: Medicare Other | Source: Other Acute Inpatient Hospital | Attending: Internal Medicine | Admitting: Internal Medicine

## 2016-12-21 ENCOUNTER — Inpatient Hospital Stay (HOSPITAL_COMMUNITY): Payer: Medicare Other

## 2016-12-21 ENCOUNTER — Encounter (HOSPITAL_COMMUNITY): Payer: Self-pay | Admitting: *Deleted

## 2016-12-21 DIAGNOSIS — C787 Secondary malignant neoplasm of liver and intrahepatic bile duct: Secondary | ICD-10-CM | POA: Diagnosis present

## 2016-12-21 DIAGNOSIS — J9601 Acute respiratory failure with hypoxia: Principal | ICD-10-CM | POA: Diagnosis present

## 2016-12-21 DIAGNOSIS — N184 Chronic kidney disease, stage 4 (severe): Secondary | ICD-10-CM | POA: Diagnosis present

## 2016-12-21 DIAGNOSIS — C7951 Secondary malignant neoplasm of bone: Secondary | ICD-10-CM | POA: Diagnosis present

## 2016-12-21 DIAGNOSIS — R627 Adult failure to thrive: Secondary | ICD-10-CM | POA: Diagnosis present

## 2016-12-21 DIAGNOSIS — E872 Acidosis, unspecified: Secondary | ICD-10-CM | POA: Diagnosis present

## 2016-12-21 DIAGNOSIS — R000 Tachycardia, unspecified: Secondary | ICD-10-CM | POA: Diagnosis present

## 2016-12-21 DIAGNOSIS — T380X5A Adverse effect of glucocorticoids and synthetic analogues, initial encounter: Secondary | ICD-10-CM | POA: Diagnosis present

## 2016-12-21 DIAGNOSIS — E860 Dehydration: Secondary | ICD-10-CM | POA: Diagnosis present

## 2016-12-21 DIAGNOSIS — C7982 Secondary malignant neoplasm of genital organs: Secondary | ICD-10-CM | POA: Diagnosis present

## 2016-12-21 DIAGNOSIS — R0682 Tachypnea, not elsewhere classified: Secondary | ICD-10-CM | POA: Diagnosis present

## 2016-12-21 DIAGNOSIS — R51 Headache: Secondary | ICD-10-CM

## 2016-12-21 DIAGNOSIS — I471 Supraventricular tachycardia, unspecified: Secondary | ICD-10-CM | POA: Diagnosis not present

## 2016-12-21 DIAGNOSIS — Z87891 Personal history of nicotine dependence: Secondary | ICD-10-CM | POA: Diagnosis not present

## 2016-12-21 DIAGNOSIS — D72829 Elevated white blood cell count, unspecified: Secondary | ICD-10-CM | POA: Diagnosis present

## 2016-12-21 DIAGNOSIS — R519 Headache, unspecified: Secondary | ICD-10-CM

## 2016-12-21 DIAGNOSIS — Z79899 Other long term (current) drug therapy: Secondary | ICD-10-CM | POA: Diagnosis not present

## 2016-12-21 DIAGNOSIS — C3491 Malignant neoplasm of unspecified part of right bronchus or lung: Secondary | ICD-10-CM | POA: Diagnosis present

## 2016-12-21 DIAGNOSIS — J90 Pleural effusion, not elsewhere classified: Secondary | ICD-10-CM | POA: Diagnosis present

## 2016-12-21 DIAGNOSIS — R0600 Dyspnea, unspecified: Secondary | ICD-10-CM

## 2016-12-21 DIAGNOSIS — R7989 Other specified abnormal findings of blood chemistry: Secondary | ICD-10-CM | POA: Diagnosis not present

## 2016-12-21 DIAGNOSIS — R55 Syncope and collapse: Secondary | ICD-10-CM | POA: Diagnosis present

## 2016-12-21 DIAGNOSIS — J9621 Acute and chronic respiratory failure with hypoxia: Secondary | ICD-10-CM

## 2016-12-21 DIAGNOSIS — I5031 Acute diastolic (congestive) heart failure: Secondary | ICD-10-CM | POA: Diagnosis not present

## 2016-12-21 DIAGNOSIS — E871 Hypo-osmolality and hyponatremia: Secondary | ICD-10-CM | POA: Diagnosis present

## 2016-12-21 DIAGNOSIS — L439 Lichen planus, unspecified: Secondary | ICD-10-CM | POA: Diagnosis present

## 2016-12-21 DIAGNOSIS — C801 Malignant (primary) neoplasm, unspecified: Secondary | ICD-10-CM | POA: Diagnosis present

## 2016-12-21 HISTORY — DX: Chronic kidney disease, stage 3 unspecified: N18.30

## 2016-12-21 HISTORY — DX: Secondary malignant neoplasm of unspecified site: C79.9

## 2016-12-21 HISTORY — DX: Chronic kidney disease, stage 3 (moderate): N18.3

## 2016-12-21 LAB — HEPARIN LEVEL (UNFRACTIONATED)
HEPARIN UNFRACTIONATED: 0.73 [IU]/mL — AB (ref 0.30–0.70)
HEPARIN UNFRACTIONATED: 0.14 [IU]/mL — AB (ref 0.30–0.70)

## 2016-12-21 LAB — BASIC METABOLIC PANEL
ANION GAP: 12 (ref 5–15)
BUN: 34 mg/dL — ABNORMAL HIGH (ref 6–20)
CALCIUM: 8.8 mg/dL — AB (ref 8.9–10.3)
CO2: 16 mmol/L — ABNORMAL LOW (ref 22–32)
CREATININE: 1.97 mg/dL — AB (ref 0.61–1.24)
Chloride: 111 mmol/L (ref 101–111)
GFR calc Af Amer: 42 mL/min — ABNORMAL LOW (ref >60)
GFR, EST NON AFRICAN AMERICAN: 36 mL/min — AB (ref >60)
GLUCOSE: 111 mg/dL — AB (ref 65–99)
Potassium: 4.4 mmol/L (ref 3.5–5.1)
Sodium: 139 mmol/L (ref 135–145)

## 2016-12-21 LAB — TROPONIN I: Troponin I: <0.03 ng/mL (ref <0.03)

## 2016-12-21 LAB — CBC
HCT: 33.3 % — ABNORMAL LOW (ref 39.0–52.0)
Hemoglobin: 11 g/dL — ABNORMAL LOW (ref 13.0–17.0)
MCH: 29.5 pg (ref 26.0–34.0)
MCHC: 33 g/dL (ref 30.0–36.0)
MCV: 89.3 fL (ref 78.0–100.0)
PLATELETS: 350 10*3/uL (ref 150–400)
RBC: 3.73 MIL/uL — ABNORMAL LOW (ref 4.22–5.81)
RDW: 15.1 % (ref 11.5–15.5)
WBC: 11.7 10*3/uL — AB (ref 4.0–10.5)

## 2016-12-21 LAB — HIV ANTIBODY (ROUTINE TESTING W REFLEX): HIV Screen 4th Generation wRfx: NONREACTIVE

## 2016-12-21 MED ORDER — MAGNESIUM OXIDE 400 (241.3 MG) MG PO TABS
400.0000 mg | ORAL_TABLET | Freq: Every day | ORAL | Status: DC
  Administered 2016-12-21 – 2016-12-24 (×4): 400 mg via ORAL
  Filled 2016-12-21 (×4): qty 1

## 2016-12-21 MED ORDER — TECHNETIUM TO 99M ALBUMIN AGGREGATED
5.2000 | Freq: Once | INTRAVENOUS | Status: AC | PRN
  Administered 2016-12-21: 5.2 via INTRAVENOUS

## 2016-12-21 MED ORDER — HEPARIN (PORCINE) IN NACL 100-0.45 UNIT/ML-% IJ SOLN
1200.0000 [IU]/h | INTRAMUSCULAR | Status: DC
  Administered 2016-12-21: 1200 [IU]/h via INTRAVENOUS
  Filled 2016-12-21: qty 250

## 2016-12-21 MED ORDER — SODIUM CHLORIDE 0.9% FLUSH
3.0000 mL | Freq: Two times a day (BID) | INTRAVENOUS | Status: DC
  Administered 2016-12-21 – 2016-12-23 (×3): 3 mL via INTRAVENOUS

## 2016-12-21 MED ORDER — DIAZEPAM 2 MG PO TABS
2.0000 mg | ORAL_TABLET | Freq: Once | ORAL | Status: AC
  Administered 2016-12-21: 2 mg via ORAL
  Filled 2016-12-21: qty 1

## 2016-12-21 MED ORDER — ENOXAPARIN SODIUM 30 MG/0.3ML ~~LOC~~ SOLN
30.0000 mg | SUBCUTANEOUS | Status: DC
  Administered 2016-12-21: 30 mg via SUBCUTANEOUS
  Filled 2016-12-21: qty 0.3

## 2016-12-21 MED ORDER — DEXAMETHASONE 4 MG PO TABS
4.0000 mg | ORAL_TABLET | Freq: Two times a day (BID) | ORAL | Status: DC
  Administered 2016-12-21 – 2016-12-24 (×8): 4 mg via ORAL
  Filled 2016-12-21 (×8): qty 1

## 2016-12-21 MED ORDER — VALACYCLOVIR HCL 500 MG PO TABS
1000.0000 mg | ORAL_TABLET | Freq: Two times a day (BID) | ORAL | Status: DC
  Administered 2016-12-21 – 2016-12-24 (×7): 1000 mg via ORAL
  Filled 2016-12-21 (×8): qty 2

## 2016-12-21 MED ORDER — SODIUM BICARBONATE 8.4 % IV SOLN: INTRAVENOUS | Status: DC

## 2016-12-21 MED ORDER — SENNOSIDES-DOCUSATE SODIUM 8.6-50 MG PO TABS
1.0000 | ORAL_TABLET | Freq: Every day | ORAL | Status: DC
  Administered 2016-12-21 – 2016-12-24 (×4): 1 via ORAL
  Filled 2016-12-21 (×4): qty 1

## 2016-12-21 MED ORDER — LUBIPROSTONE 24 MCG PO CAPS
24.0000 ug | ORAL_CAPSULE | Freq: Two times a day (BID) | ORAL | Status: DC
  Administered 2016-12-21 – 2016-12-24 (×8): 24 ug via ORAL
  Filled 2016-12-21 (×8): qty 1

## 2016-12-21 MED ORDER — POLYETHYLENE GLYCOL 3350 17 G PO PACK
17.0000 g | PACK | Freq: Every day | ORAL | Status: DC
  Administered 2016-12-21 – 2016-12-24 (×4): 17 g via ORAL
  Filled 2016-12-21 (×4): qty 1

## 2016-12-21 MED ORDER — OXYCODONE-ACETAMINOPHEN 5-325 MG PO TABS
1.0000 | ORAL_TABLET | ORAL | Status: DC | PRN
  Administered 2016-12-21 – 2016-12-24 (×13): 1 via ORAL
  Filled 2016-12-21 (×15): qty 1

## 2016-12-21 MED ORDER — BICALUTAMIDE 50 MG PO TABS
50.0000 mg | ORAL_TABLET | Freq: Every day | ORAL | Status: DC
  Administered 2016-12-21 – 2016-12-24 (×4): 50 mg via ORAL
  Filled 2016-12-21 (×4): qty 1

## 2016-12-21 MED ORDER — ALPRAZOLAM 0.5 MG PO TABS: 0.5000 mg | ORAL_TABLET | Freq: Once | ORAL | Status: DC | PRN

## 2016-12-21 MED ORDER — ALBUTEROL SULFATE (2.5 MG/3ML) 0.083% IN NEBU: 2.5000 mg | INHALATION_SOLUTION | RESPIRATORY_TRACT | Status: DC | PRN

## 2016-12-21 MED ORDER — TECHNETIUM TC 99M DIETHYLENETRIAME-PENTAACETIC ACID
32.3000 | Freq: Once | INTRAVENOUS | Status: AC | PRN
  Administered 2016-12-21: 32.3 via INTRAVENOUS

## 2016-12-21 MED ORDER — FOLIC ACID 1 MG PO TABS
1.0000 mg | ORAL_TABLET | Freq: Every day | ORAL | Status: DC
Start: 2016-12-21 — End: 2016-12-24
  Administered 2016-12-21 – 2016-12-24 (×4): 1 mg via ORAL
  Filled 2016-12-21 (×4): qty 1

## 2016-12-21 MED ORDER — DIPHENHYDRAMINE HCL 50 MG/ML IJ SOLN
12.5000 mg | Freq: Three times a day (TID) | INTRAMUSCULAR | Status: DC | PRN
  Administered 2016-12-21 – 2016-12-23 (×5): 12.5 mg via INTRAVENOUS
  Filled 2016-12-21 (×6): qty 1

## 2016-12-21 MED ORDER — PROCHLORPERAZINE MALEATE 10 MG PO TABS: 10.0000 mg | ORAL_TABLET | Freq: Four times a day (QID) | ORAL | Status: DC | PRN

## 2016-12-21 MED ORDER — HEPARIN (PORCINE) IN NACL 100-0.45 UNIT/ML-% IJ SOLN: 1000.0000 [IU]/h | INTRAMUSCULAR | Status: DC

## 2016-12-21 MED ORDER — HEPARIN BOLUS VIA INFUSION
3000.0000 [IU] | Freq: Once | INTRAVENOUS | Status: DC
  Filled 2016-12-21: qty 3000

## 2016-12-21 MED ORDER — ENSURE ENLIVE PO LIQD
237.0000 mL | Freq: Two times a day (BID) | ORAL | Status: DC
  Administered 2016-12-21 – 2016-12-23 (×5): 237 mL via ORAL

## 2016-12-21 MED ORDER — TAMSULOSIN HCL 0.4 MG PO CAPS
0.4000 mg | ORAL_CAPSULE | Freq: Every day | ORAL | Status: DC
  Administered 2016-12-21 – 2016-12-24 (×4): 0.4 mg via ORAL
  Filled 2016-12-21 (×4): qty 1

## 2016-12-21 MED ORDER — HEPARIN (PORCINE) IN NACL 100-0.45 UNIT/ML-% IJ SOLN: 1300.0000 [IU]/h | INTRAMUSCULAR | Status: DC

## 2016-12-21 MED ORDER — ACETAMINOPHEN 325 MG PO TABS: 650.0000 mg | ORAL_TABLET | Freq: Four times a day (QID) | ORAL | Status: DC | PRN

## 2016-12-21 MED ORDER — SODIUM BICARBONATE 650 MG PO TABS
650.0000 mg | ORAL_TABLET | Freq: Two times a day (BID) | ORAL | Status: DC
  Administered 2016-12-21 – 2016-12-24 (×7): 650 mg via ORAL
  Filled 2016-12-21 (×7): qty 1

## 2016-12-21 NOTE — Progress Notes (Signed)
ANTICOAGULATION CONSULT NOTE - follow up  Pharmacy Consult for IV heparin Indication: R/o PE  No Known Allergies  Patient Measurements: Height: '5\' 9"'$  (175.3 cm) Weight: 157 lb 3 oz (71.3 kg) IBW/kg (Calculated) : 70.7 Heparin Dosing Weight: 71.3 kg  Vital Signs: Temp: 97.9 F (36.6 C) (05/04 0615) Temp Source: Oral (05/04 0615) BP: 105/63 (05/04 0615) Pulse Rate: 78 (05/04 0615)  Labs:  Recent Labs  12/19/16 0759 12/21/16 0450 12/21/16 0510 12/21/16 0647  HGB 11.6* 11.0*  --   --   HCT 34.5* 33.3*  --   --   PLT 374 350  --   --   HEPARINUNFRC  --   --   --  0.73*  CREATININE 1.8* 1.97*  --   --   TROPONINI  --   --  <0.03  --     Estimated Creatinine Clearance: 41.4 mL/min (A) (by C-G formula based on SCr of 1.97 mg/dL (H)).   Medical History: Past Medical History:  Diagnosis Date  . Chronic fatigue 04/12/2016  . Chronic pain 04/12/2016  . Shortness of breath dyspnea     Medications:  Scheduled:  . bicalutamide  50 mg Oral Daily  . dexamethasone  4 mg Oral BID WC  . folic acid  1 mg Oral Daily  . lubiprostone  24 mcg Oral BID WC  . magnesium oxide  400 mg Oral Daily  . polyethylene glycol  17 g Oral Daily  . senna-docusate  1 tablet Oral Daily  . sodium bicarbonate  650 mg Oral BID  . sodium chloride flush  3 mL Intravenous Q12H  . tamsulosin  0.4 mg Oral Daily  . valACYclovir  1,000 mg Oral BID   Infusions:  . heparin 1,200 Units/hr (12/21/16 0524)    Assessment: 61 yoM c/o SOB, pleuritic CP, tachycardia, D-dimer=0.70.   5/3- Pt started at [facility]?  Baseline coags:  PT=10.9, INR=1.03 and aPtt=24 (from other hospital) Heparin 5000 unit bolus x1 drip at 1200 started at 2230  Today, 12/21/16  Heparin level slightly suprtherapeutic with heparin at current rate of 1200 units/hr  CBC OK, no reported bleeding  Goal of Therapy:  Heparin level 0.3-0.7 units/ml Monitor platelets by anticoagulation protocol: Yes   Plan:  1)  Reduce IV heparin from 1200 units/hr to 1000 units/hr 2) Recheck heparin 6 hours after rate increase 3) Daily heparin level and CBC   Adrian Saran, PharmD, BCPS Pager [phone] 12/21/2016 8:22 AM

## 2016-12-21 NOTE — Progress Notes (Signed)
PROGRESS NOTE  Shinichi Anguiano JKK:[id number] DOB: Mar 02, 1961 DOA: 12/21/2016 PCP: [name], [name], MD  HPI/Recap of past 24 hours:  Report feeling dizzy and DOE for two months,  No lower extremity edema  Assessment/Plan: Principal Problem:   Acute respiratory failure with hypoxia ([place]) Active Problems:   Non-small cell cancer of right lung (HCC)   Tachycardia   Tachypnea   Metabolic acidosis, NAG, bicarbonate losses   CKD (chronic kidney disease) stage 4, GFR 15-29 ml/min (HCC)   1. Acute respiratory failure with hypoxia - tachycardia, tachypnea, and DOE VQscan low probability, no lower extremity edema, d/c heparin drip, troponin negative, echo pending 2. NSCLC stage 4 with liver and pancreatic mets - chemo patient of Dr. Earlie Server on immunotherapy, just had this on 5/2 1. Tumor 4.8 cm on todays non-contrast CT chest at [facility], was 3.8x5.0cm on our CT in March for comparison 2. Has had headache for several months,  MRI brain in Jan was negative at [facility]. 3. NAG metabolic acidosis - 1. Chronic since at least 2016 2. continue PO bicarb BID CKD stage 4 - chronic and stable since 2016 Prostate cancer on xtandi/casodex FTT: need PT  DVT prophylaxis: lovenox  Code Status: Full Family Communication: No family in room Disposition Plan: pending Consults called: None   Procedures:  VQ scan  Antibiotics:  none   Objective: BP 98/64 (BP Location: Right Arm)   Pulse 96   Temp 98.1 F (36.7 C) (Oral)   Resp 17   Ht '5\' 9"'$  (1.753 m)   Wt 71.3 kg (157 lb 3 oz)   SpO2 100%   BMI 23.21 kg/m   Intake/Output Summary (Last 24 hours) at 12/21/16 1513 Last data filed at 12/21/16 1253  Gross per 24 hour  Intake            157.2 ml  Output              250 ml  Net            -92.8 ml   Filed Weights   12/21/16 0158  Weight: 71.3 kg (157 lb 3 oz)    Exam:   General:  Frail, chronically ill appearing  Cardiovascular: RRR  Respiratory:  CTABL  Abdomen: Soft/ND/NT, positive BS  Musculoskeletal: No Edema  Neuro: alert, follow commands  Data Reviewed: Basic Metabolic Panel:  Recent Labs Lab 12/19/16 0759 12/21/16 0450  NA 136 139  K 4.4 4.4  CL  --  111  CO2 18* 16*  GLUCOSE 114 111*  BUN 35.4* 34*  CREATININE 1.8* 1.97*  CALCIUM 9.0 8.8*   Liver Function Tests:  Recent Labs Lab 12/19/16 0759  AST 11  ALT 8  ALKPHOS 172*  BILITOT 0.23  PROT 6.5  ALBUMIN 2.4*   No results for input(s): LIPASE, AMYLASE in the last 168 hours. No results for input(s): AMMONIA in the last 168 hours. CBC:  Recent Labs Lab 12/19/16 0759 12/21/16 0450  WBC 13.3* 11.7*  NEUTROABS 10.3*  --   HGB 11.6* 11.0*  HCT 34.5* 33.3*  MCV 88.9 89.3  PLT 374 350   Cardiac Enzymes:    Recent Labs Lab 12/21/16 0510 12/21/16 1238  TROPONINI <0.03 <0.03   BNP (last 3 results) No results for input(s): BNP in the last 8760 hours.  ProBNP (last 3 results) No results for input(s): PROBNP in the last 8760 hours.  CBG: No results for input(s): GLUCAP in the last 168 hours.  No results found  for this or any previous visit (from the past 240 hour(s)).   Studies: Nm Pulmonary Perf And Vent  Result Date: 12/21/2016 CLINICAL DATA:  56 year old male with shortness of breath and positive D-dimer. Non-small cell lung cancer. EXAM: NUCLEAR MEDICINE VENTILATION - PERFUSION LUNG SCAN TECHNIQUE: Ventilation images were obtained in multiple projections using inhaled aerosol Tc-24mDTPA. Perfusion images were obtained in multiple projections after intravenous injection of Tc-931mAA. RADIOPHARMACEUTICALS:  32.3 mCi Technetium-9954mPA aerosol inhalation and 5.2 mCi Technetium-58m85m IV COMPARISON:  Chest abdomen and pelvis CT without contrast 10/24/2016 FINDINGS: Ventilation: Suboptimal ventilation imaging, but no discrete ventilation defect identified. Perfusion: No perfusion defect identified. IMPRESSION: No perfusion defect to suggest  acute pulmonary embolus. Electronically Signed   By: H  HGenevie Ann.   On: 12/21/2016 11:31    Scheduled Meds: . bicalutamide  50 mg Oral Daily  . dexamethasone  4 mg Oral BID WC  . feeding supplement (ENSURE ENLIVE)  237 mL Oral BID BM  . folic acid  1 mg Oral Daily  . lubiprostone  24 mcg Oral BID WC  . magnesium oxide  400 mg Oral Daily  . polyethylene glycol  17 g Oral Daily  . senna-docusate  1 tablet Oral Daily  . sodium bicarbonate  650 mg Oral BID  . sodium chloride flush  3 mL Intravenous Q12H  . tamsulosin  0.4 mg Oral Daily  . valACYclovir  1,000 mg Oral BID    Continuous Infusions:   No charge note  Nancylee Gaines MD, PhD  [facility] Pager [phone] 7PM-7AM, please contact night-coverage at [email or web address], password [email or web address], 3:13 PM  LOS: 0 days

## 2016-12-21 NOTE — Progress Notes (Signed)
Initial Nutrition Assessment  DOCUMENTATION CODES:   Not applicable  INTERVENTION:   Ensure Enlive po BID, each supplement provides 350 kcal and 20 grams of protein  NUTRITION DIAGNOSIS:   Increased nutrient needs related to cancer and cancer related treatments as evidenced by increased estimated needs from protein.  GOAL:   Patient will meet greater than or equal to 90% of their needs  MONITOR:   PO intake, Supplement acceptance, Labs, Weight trends  REASON FOR ASSESSMENT:   Malnutrition Screening Tool    ASSESSMENT:   56 y.o. male with medical history significant of stage 4 NSCLC with mets to liver, pancreas.  Currently on immunotherapy with Dr. Earlie Server.  CKD stage 4.  Patient presents to the ED at [facility] with c/o DOE.    Met with pt in room today. Pt reports poor appetite and oral intake for 2 weeks pta but reports his appetite is improving today. Pt NPO at time of RD visit but advanced to regular diet now. Pt likes Ensure; RD will order. Per chart, pt has remained weight stable. Continue to encourage intake of meals and supplements.   Medications reviewed and include: folic acid, MgOxide, miralax, senokot, Na Bicarb, heparin, oxycodone  Labs reviewed: BUN 34(H), creat 1.97(H), Ca 8.8(L) 11.7(H)  Nutrition-Focused physical exam completed. Findings are no fat depletion, moderate muscle depletion in lower legs, and no edema.   Diet Order:  Diet regular Room service appropriate? Yes with Assist; Fluid consistency: Thin  Skin:  Reviewed, no issues  Last BM:  5/3  Height:   Ht Readings from Last 1 Encounters:  12/21/16 5' 9"  (1.753 m)    Weight:   Wt Readings from Last 1 Encounters:  12/21/16 157 lb 3 oz (71.3 kg)    Ideal Body Weight:  72.7 kg  BMI:  Body mass index is 23.21 kg/m.  Estimated Nutritional Needs:   Kcal:  2150-2450kcal/day   Protein:  107-121g/day   Fluid:  >2L/day   EDUCATION NEEDS:   No education needs identified at  this time  Koleen Distance, RD, LDN Pager #[phone]

## 2016-12-21 NOTE — Progress Notes (Signed)
ANTICOAGULATION CONSULT NOTE - Initial Consult  Pharmacy Consult for IV heparin Indication: R/o PE  No Known Allergies  Patient Measurements: Height: '5\' 9"'$  (175.3 cm) Weight: 157 lb 3 oz (71.3 kg) IBW/kg (Calculated) : 70.7 Heparin Dosing Weight: 71.3 kg  Vital Signs: Temp: 98.3 F (36.8 C) (05/04 0158) Temp Source: Oral (05/04 0158) BP: 113/71 (05/04 0158) Pulse Rate: 88 (05/04 0158)  Labs:  Recent Labs  12/19/16 0759  HGB 11.6*  HCT 34.5*  PLT 374  CREATININE 1.8*    Estimated Creatinine Clearance: 45.3 mL/min (A) (by C-G formula based on SCr of 1.8 mg/dL (H)).   Medical History: Past Medical History:  Diagnosis Date  . Chronic fatigue 04/12/2016  . Chronic pain 04/12/2016  . Shortness of breath dyspnea     Medications:  Scheduled:  . bicalutamide  50 mg Oral Daily  . dexamethasone  4 mg Oral BID WC  . folic acid  1 mg Oral Daily  . heparin  3,000 Units Intravenous Once  . lubiprostone  24 mcg Oral BID WC  . magnesium oxide  400 mg Oral Daily  . polyethylene glycol  17 g Oral Daily  . senna-docusate  1 tablet Oral Daily  . tamsulosin  0.4 mg Oral Daily  . valACYclovir  1,000 mg Oral BID   Infusions:  . heparin      Assessment: 79 yoM c/o SOB, pleuritic CP, tachycardia, D-dimer=0.70.   5/3- Pt started at [facility]?  Baseline coags:  PT=10.9, INR=1.03 and aPtt=24 (from other hospital) Heparin 5000 unit bolus x1 drip at 1200 started at 2230  Goal of Therapy:  Heparin level 0.3-0.7 units/ml Monitor platelets by anticoagulation protocol: Yes   Plan:  Continue heparin drip at 1200 units/hr Daily CBC/HL Check 1st HL 0630 today  Dorrene German 12/21/2016,4:28 AM

## 2016-12-21 NOTE — Progress Notes (Signed)
Page sent out to [facility] admissions notifying them of patients arrival. Call received stating Dr. Alcario Drought was made aware. Awaiting orders for patient. VSS. Patient comfortable. Will continue to monitor.

## 2016-12-21 NOTE — Progress Notes (Signed)
ANTICOAGULATION CONSULT NOTE - Brief Note  Pharmacy Consult for IV heparin Indication: R/o PE  No Known Allergies  Labs:  Recent Labs  12/19/16 0759 12/21/16 0450 12/21/16 0510 12/21/16 0647 12/21/16 1238 12/21/16 1659  HGB 11.6* 11.0*  --   --   --   --   HCT 34.5* 33.3*  --   --   --   --   PLT 374 350  --   --   --   --   HEPARINUNFRC  --   --   --  0.73*  --  0.14*  CREATININE 1.8* 1.97*  --   --   --   --   TROPONINI  --   --  <0.03  --  <0.03 0.04*     Assessment: 33 yoM c/o SOB, pleuritic CP, tachycardia, D-dimer=0.70.   5/3- Pt started at [facility]?  Baseline coags:  PT=10.9, INR=1.03 and aPtt=24 (from other hospital)  - Heparin level = 0.14 drawn after heparin gtt stopped.  Pharmacy was not aware of infusion being stopped as consult was active.   - Heparin labs stopped  Doreene Eland, PharmD, BCPS.   Pager: [phone] 12/21/2016 6:02 PM

## 2016-12-21 NOTE — H&P (Signed)
History and Physical    Francisco Love OVF:[id number] DOB: 18-Sep-1960 DOA: 12/21/2016  PCP: [name], [name], MD  Patient coming from: Home  I have personally briefly reviewed patient's old medical records in [place]  Chief Complaint: DOE  HPI: Francisco Love is a 56 y.o. male with medical history significant of stage 4 NSCLC with mets to liver, pancreas.  Currently on immunotherapy with Dr. Earlie Server.  CKD stage 4.  Patient presents to the ED at [facility] with c/o DOE.  Associated pleuritic chest pain, tachycardia up to 100-130.  Desaturates to 80% on ambulation.  Symptoms ongoing for the past day or so.  Better at rest, worse with exertion.  He becomes short of breath just standing up and walking he says.   ED Course: D.Dimer 0.70.  negative troponin, WBC 13.1, BNP 230, temperature normal, currently oxygen saturation 98% on 2 L nasal cannula, stable renal function, CT chest without contrast showed no pneumonia, but showed small right-sided pleural effusion, central emphysematous change, mediastinal mass 4.8 cm.  Patient was transferred to [facility].   Review of Systems: As per HPI otherwise 10 point review of systems negative.   Past Medical History:  Diagnosis Date  . Chronic fatigue 04/12/2016  . Chronic pain 04/12/2016  . Shortness of breath dyspnea     Past Surgical History:  Procedure Laterality Date  . APPENDECTOMY    . VIDEO BRONCHOSCOPY N/A 09/21/2014   Procedure: VIDEO BRONCHOSCOPY WITH FLUORO;  Surgeon: Rigoberto Noel, MD;  Location: [place];  Service: Cardiopulmonary;  Laterality: N/A;  . VIDEO BRONCHOSCOPY WITH ENDOBRONCHIAL ULTRASOUND N/A 09/23/2014   Procedure: VIDEO BRONCHOSCOPY WITH ENDOBRONCHIAL ULTRASOUND;  Surgeon: Rigoberto Noel, MD;  Location: [place];  Service: Pulmonary;  Laterality: N/A;     reports that he quit smoking about 2 years ago. His smoking use included Cigarettes. He smoked 1.00 pack per day. He has never used smokeless tobacco.  He reports that he drinks about 3.6 oz of alcohol per week . He reports that he uses drugs, including Marijuana.  No Known Allergies  No family history on file.   Prior to Admission medications   Medication Sig Start Date End Date Taking? Authorizing Provider  acetaminophen (TYLENOL) 325 MG tablet Take 2 tablets (650 mg total) by mouth every 6 (six) hours as needed for mild pain (or Fever >/= 101). 09/24/14  Yes Delfina Redwood, MD  albuterol (PROVENTIL) (2.5 MG/3ML) 0.083% nebulizer solution Take 3 mLs (2.5 mg total) by nebulization every 2 (two) hours as needed for wheezing. 09/24/14  Yes Delfina Redwood, MD  AMITIZA 24 MCG capsule Take 24 mcg by mouth 2 (two) times daily with a meal.  04/06/15  Yes Historical Provider, MD  bicalutamide (CASODEX) 50 MG tablet Take 50 mg by mouth daily.  09/08/15  Yes Historical Provider, MD  dexamethasone (DECADRON) 4 MG tablet Take 4 mg by mouth 2 (two) times daily with a meal.   Yes Historical Provider, MD  folic acid (FOLVITE) 1 MG tablet Take 1 mg by mouth daily.   Yes Historical Provider, MD  lidocaine-prilocaine (EMLA) cream Apply 1 application topically as needed. Apply to port site prior to chemotherapy. 11/07/16  Yes Curt Bears, MD  magnesium oxide (MAG-OX) 400 MG tablet Take 400 mg by mouth daily.   Yes Historical Provider, MD  oxyCODONE-acetaminophen (PERCOCET/ROXICET) 5-325 MG tablet Take 1 tablet by mouth every 4 (four) hours as needed for severe pain. 12/19/16  Yes Curt Bears, MD  prochlorperazine (COMPAZINE) 10 MG tablet Take 1 tablet (10 mg total) by mouth every 6 (six) hours as needed for nausea or vomiting. 10/22/14  Yes Curt Bears, MD  senna-docusate (SENOKOT-S) 8.6-50 MG per tablet Take 1 tablet by mouth daily. 10/19/14  Yes Lauree Chandler, NP  tamsulosin (FLOMAX) 0.4 MG CAPS capsule Take 0.4 mg by mouth daily.  03/29/15  Yes Historical Provider, MD  valACYclovir (VALTREX) 1000 MG tablet Take 1,000 mg by mouth 2 (two) times daily.    Yes Historical Provider, MD  diphenhydrAMINE (BENADRYL) 25 MG tablet Take 25 mg by mouth.    Historical Provider, MD  FLUOCINOLONE ACETONIDE BODY 0.01 % OIL  11/30/15   Historical Provider, MD  levocetirizine (XYZAL) 5 MG tablet Take 5 mg by mouth. 11/30/15 11/29/16  Historical Provider, MD  mirtazapine (REMERON) 15 MG tablet Take 1 tablet (15 mg total) by mouth at bedtime. 12/19/16   Curt Bears, MD  polyethylene glycol [facility] / GLYCOLAX) packet Take 17 g by mouth daily.    Historical Provider, MD  SUMAtriptan (IMITREX) 25 MG tablet  05/31/16   Historical Provider, MD  triamcinolone ointment (KENALOG) 0.1 % Apply to arms twice a day until clear, then stop. 05/03/14   Historical Provider, MD  [name] 40 MG capsule  07/24/16   Historical Provider, MD    Physical Exam: Vitals:   12/21/16 0158  BP: 113/71  Pulse: 88  Resp: 18  Temp: 98.3 F (36.8 C)  TempSrc: Oral  SpO2: 99%  Weight: 71.3 kg (157 lb 3 oz)  Height: '5\' 9"'$  (1.753 m)    Constitutional: NAD, calm, comfortable Eyes: PERRL, lids and conjunctivae normal ENMT: Mucous membranes are moist. Posterior pharynx clear of any exudate or lesions.Normal dentition.  Neck: normal, supple, no masses, no thyromegaly Respiratory: clear to auscultation bilaterally, no wheezing, no crackles. Normal respiratory effort. No accessory muscle use.  Cardiovascular: Regular rate and rhythm, no murmurs / rubs / gallops. No extremity edema. 2+ pedal pulses. No carotid bruits.  Abdomen: no tenderness, no masses palpated. No hepatosplenomegaly. Bowel sounds positive.  Musculoskeletal: no clubbing / cyanosis. No joint deformity upper and lower extremities. Good ROM, no contractures. Normal muscle tone.  Skin: no rashes, lesions, ulcers. No induration Neurologic: CN 2-12 grossly intact. Sensation intact, DTR normal. Strength 5/5 in all 4.  Psychiatric: Normal judgment and insight. Alert and oriented x 3. Normal mood.    Labs on Admission: I have  personally reviewed following labs and imaging studies  CBC:  Recent Labs Lab 12/19/16 0759  WBC 13.3*  NEUTROABS 10.3*  HGB 11.6*  HCT 34.5*  MCV 88.9  PLT 160   Basic Metabolic Panel:  Recent Labs Lab 12/19/16 0759  NA 136  K 4.4  CO2 18*  GLUCOSE 114  BUN 35.4*  CREATININE 1.8*  CALCIUM 9.0   GFR: Estimated Creatinine Clearance: 45.3 mL/min (A) (by C-G formula based on SCr of 1.8 mg/dL (H)). Liver Function Tests:  Recent Labs Lab 12/19/16 0759  AST 11  ALT 8  ALKPHOS 172*  BILITOT 0.23  PROT 6.5  ALBUMIN 2.4*   No results for input(s): LIPASE, AMYLASE in the last 168 hours. No results for input(s): AMMONIA in the last 168 hours. Coagulation Profile: No results for input(s): INR, PROTIME in the last 168 hours. Cardiac Enzymes: No results for input(s): CKTOTAL, CKMB, CKMBINDEX, TROPONINI in the last 168 hours. BNP (last 3 results) No results for input(s): PROBNP in the last 8760 hours. HbA1C: No results for  input(s): HGBA1C in the last 72 hours. CBG: No results for input(s): GLUCAP in the last 168 hours. Lipid Profile: No results for input(s): CHOL, HDL, LDLCALC, TRIG, CHOLHDL, LDLDIRECT in the last 72 hours. Thyroid Function Tests: No results for input(s): TSH, T4TOTAL, FREET4, T3FREE, THYROIDAB in the last 72 hours. Anemia Panel: No results for input(s): VITAMINB12, FOLATE, FERRITIN, TIBC, IRON, RETICCTPCT in the last 72 hours. Urine analysis: No results found for: COLORURINE, APPEARANCEUR, LABSPEC, PHURINE, GLUCOSEU, HGBUR, BILIRUBINUR, KETONESUR, PROTEINUR, UROBILINOGEN, NITRITE, LEUKOCYTESUR  Radiological Exams on Admission: No results found.  EKG: Independently reviewed.  Assessment/Plan Principal Problem:   Acute respiratory failure with hypoxia (HCC) Active Problems:   Non-small cell cancer of right lung (HCC)   Tachycardia   Tachypnea   Metabolic acidosis, NAG, bicarbonate losses   CKD (chronic kidney disease) stage 4, GFR 15-29  ml/min (HCC)    1. Acute respiratory failure with hypoxia - tachycardia, tachypnea, and DOE 1. Empiric heparin gtt 2. Ruling out PE with VQ scan in AM 1. NPO for this 3. 2d echo ordered, as cardiac causes are also a possibility 4. Trop neg x1, will get serial trops 5. Tele monitor 2. NSCLC stage 4 with liver and pancreatic mets - chemo patient of Dr. Earlie Server on immunotherapy, just had this on 5/2 1. Tumor 4.8 cm on todays non-contrast CT chest at [facility], was 3.8x5.0cm on our CT in March for comparison 2. Has had headache for several months he states going back all the way to Jan at least, but MRI brain in Jan was negative at [facility]. 3. NAG metabolic acidosis - 1. Chronic since at least 2016 2. Will start PO bicarb BID 4. CKD stage 4 - chronic and stable since 2016  DVT prophylaxis: Heparin Sunol Code Status: Full Family Communication: No family in room Disposition Plan: Home after admit Consults called: None Admission status: Admit to inpatient   [place] [place] [facility] Pager [phone]  If 7AM-7PM, please contact day team taking care of patient [email or web address] Password [email or web address]  12/21/2016, 4:48 AM

## 2016-12-22 ENCOUNTER — Encounter (HOSPITAL_COMMUNITY): Payer: Self-pay | Admitting: General Surgery

## 2016-12-22 ENCOUNTER — Inpatient Hospital Stay (HOSPITAL_COMMUNITY): Payer: Medicare Other

## 2016-12-22 DIAGNOSIS — I471 Supraventricular tachycardia: Secondary | ICD-10-CM

## 2016-12-22 DIAGNOSIS — I5031 Acute diastolic (congestive) heart failure: Secondary | ICD-10-CM

## 2016-12-22 LAB — URINALYSIS, ROUTINE W REFLEX MICROSCOPIC
Bacteria, UA: NONE SEEN
Bilirubin Urine: NEGATIVE
GLUCOSE, UA: NEGATIVE mg/dL
Ketones, ur: NEGATIVE mg/dL
Leukocytes, UA: NEGATIVE
Nitrite: NEGATIVE
PROTEIN: 100 mg/dL — AB
SPECIFIC GRAVITY, URINE: 1.006 (ref 1.005–1.030)
SQUAMOUS EPITHELIAL / LPF: NONE SEEN
pH: 6 (ref 5.0–8.0)

## 2016-12-22 LAB — CBC WITH DIFFERENTIAL/PLATELET
Basophils Absolute: 0 10*3/uL (ref 0.0–0.1)
Basophils Relative: 0 %
EOS ABS: 0.4 10*3/uL (ref 0.0–0.7)
Eosinophils Relative: 3 %
HCT: 29.7 % — ABNORMAL LOW (ref 39.0–52.0)
Hemoglobin: 10 g/dL — ABNORMAL LOW (ref 13.0–17.0)
LYMPHS ABS: 2.6 10*3/uL (ref 0.7–4.0)
LYMPHS PCT: 19 %
MCH: 29.5 pg (ref 26.0–34.0)
MCHC: 33.7 g/dL (ref 30.0–36.0)
MCV: 87.6 fL (ref 78.0–100.0)
MONO ABS: 1.1 10*3/uL — AB (ref 0.1–1.0)
Monocytes Relative: 8 %
Neutro Abs: 9.5 10*3/uL — ABNORMAL HIGH (ref 1.7–7.7)
Neutrophils Relative %: 70 %
PLATELETS: 336 10*3/uL (ref 150–400)
RBC: 3.39 MIL/uL — AB (ref 4.22–5.81)
RDW: 15 % (ref 11.5–15.5)
WBC: 13.5 10*3/uL — AB (ref 4.0–10.5)

## 2016-12-22 LAB — BASIC METABOLIC PANEL
Anion gap: 6 (ref 5–15)
BUN: 33 mg/dL — AB (ref 6–20)
CALCIUM: 8.4 mg/dL — AB (ref 8.9–10.3)
CO2: 21 mmol/L — ABNORMAL LOW (ref 22–32)
CREATININE: 1.85 mg/dL — AB (ref 0.61–1.24)
Chloride: 106 mmol/L (ref 101–111)
GFR calc Af Amer: 45 mL/min — ABNORMAL LOW (ref >60)
GFR, EST NON AFRICAN AMERICAN: 39 mL/min — AB (ref >60)
GLUCOSE: 92 mg/dL (ref 65–99)
POTASSIUM: 4.6 mmol/L (ref 3.5–5.1)
SODIUM: 133 mmol/L — AB (ref 135–145)

## 2016-12-22 LAB — ECHOCARDIOGRAM COMPLETE
Height: 69 in
WEIGHTICAEL: 2515.01 [oz_av]

## 2016-12-22 LAB — TROPONIN I: Troponin I: 0.04 ng/mL (ref <0.03)

## 2016-12-22 MED ORDER — ENOXAPARIN SODIUM 40 MG/0.4ML ~~LOC~~ SOLN
40.0000 mg | SUBCUTANEOUS | Status: DC
  Administered 2016-12-22 – 2016-12-23 (×2): 40 mg via SUBCUTANEOUS
  Filled 2016-12-22 (×2): qty 0.4

## 2016-12-22 MED ORDER — GABAPENTIN 300 MG PO CAPS
300.0000 mg | ORAL_CAPSULE | Freq: Two times a day (BID) | ORAL | Status: DC
  Administered 2016-12-22 – 2016-12-24 (×4): 300 mg via ORAL
  Filled 2016-12-22 (×4): qty 1

## 2016-12-22 MED ORDER — SODIUM CHLORIDE 0.9 % IV SOLN
INTRAVENOUS | Status: DC
  Administered 2016-12-22 – 2016-12-23 (×2): via INTRAVENOUS

## 2016-12-22 NOTE — Progress Notes (Signed)
PROGRESS NOTE  Francisco Love [id number] DOB: Jan 03, 1961 DOA: 12/21/2016 PCP: Sherlynn Stalls., MD  HPI/Recap of past 24 hours:  Report feeling dizzy and DOE for two months, frequent presyncope episodes No lower extremity edema  Assessment/Plan: Principal Problem:   Acute respiratory failure with hypoxia (HCC) Active Problems:   Non-small cell cancer of right lung (HCC)   Tachycardia   Tachypnea   Metabolic acidosis, NAG, bicarbonate losses   CKD (chronic kidney disease) stage 4, GFR 15-29 ml/min (HCC)   1. Acute respiratory failure with hypoxia - tachycardia, tachypnea, and DOE VQscan low probability, no lower extremity edema, d/c heparin drip, troponin negative, echo lvef wnl 2. NSCLC stage 4 with liver and pancreatic mets - chemo patient of Dr. Earlie Server on immunotherapy, just had this on 5/2 1. Tumor 4.8 cm on todays non-contrast CT chest at [facility], was 3.8x5.0cm on our CT in March for comparison 2. Has had headache for several months,  MRI brain in Jan was negative at [facility]. 3. NAG metabolic acidosis - 1. Chronic since at least 2016 2. continue PO bicarb BID  Hyponatremia: likely from dehydration, continue ivf CKD stage 4 - chronic and stable since 2016 Prostate cancer on xtandi/casodex FTT: need PT Suspect ongoing dizziness /frequent presyncope episodes, DOE are multifacotrial including stage IV lung cancer, metastatic prostate cancer, dehydration, deconditioning, will continue hydration, ambulate, may need home health He report he walks with a cane  Consider outpatient holter monitor, he does has tendency to have sinus tachycardia upon standing up.  DVT prophylaxis: lovenox  Code Status: Full Family Communication: No family in room Disposition Plan: home with home health vs snf pending PT eval Consults called: None   Procedures:  VQ scan  Antibiotics:  none   Objective: BP 110/83 (BP Location: Right Arm)   Pulse (!) 102   Temp  97.8 F (36.6 C) (Oral)   Resp 20   Ht '5\' 9"'$  (1.753 m)   Wt 71.3 kg (157 lb 3 oz)   SpO2 92% Comment: ambulating 91-99%  BMI 23.21 kg/m   Intake/Output Summary (Last 24 hours) at 12/22/16 1821 Last data filed at 12/22/16 1819  Gross per 24 hour  Intake              840 ml  Output             2005 ml  Net            -1165 ml   Filed Weights   12/21/16 0158  Weight: 71.3 kg (157 lb 3 oz)    Exam:   General:  Frail, chronically ill appearing  Cardiovascular: RRR  Respiratory: CTABL  Abdomen: Soft/ND/NT, positive BS  Musculoskeletal: No Edema  Neuro: alert, follow commands  Data Reviewed: Basic Metabolic Panel:  Recent Labs Lab 12/19/16 0759 12/21/16 0450 12/22/16 0519  NA 136 139 133*  K 4.4 4.4 4.6  CL  --  111 106  CO2 18* 16* 21*  GLUCOSE 114 111* 92  BUN 35.4* 34* 33*  CREATININE 1.8* 1.97* 1.85*  CALCIUM 9.0 8.8* 8.4*   Liver Function Tests:  Recent Labs Lab 12/19/16 0759  AST 11  ALT 8  ALKPHOS 172*  BILITOT 0.23  PROT 6.5  ALBUMIN 2.4*   No results for input(s): LIPASE, AMYLASE in the last 168 hours. No results for input(s): AMMONIA in the last 168 hours. CBC:  Recent Labs Lab 12/19/16 0759 12/21/16 0450 12/22/16 0519  WBC 13.3* 11.7* 13.5*  NEUTROABS  10.3*  --  9.5*  HGB 11.6* 11.0* 10.0*  HCT 34.5* 33.3* 29.7*  MCV 88.9 89.3 87.6  PLT 374 350 336   Cardiac Enzymes:    Recent Labs Lab 12/21/16 0510 12/21/16 1238 12/21/16 1659  TROPONINI <0.03 <0.03 0.04*   BNP (last 3 results) No results for input(s): BNP in the last 8760 hours.  ProBNP (last 3 results) No results for input(s): PROBNP in the last 8760 hours.  CBG: No results for input(s): GLUCAP in the last 168 hours.  No results found for this or any previous visit (from the past 240 hour(s)).   Studies: No results found.  Scheduled Meds: . bicalutamide  50 mg Oral Daily  . dexamethasone  4 mg Oral BID WC  . enoxaparin (LOVENOX) injection  40 mg  Subcutaneous Q24H  . feeding supplement (ENSURE ENLIVE)  237 mL Oral BID BM  . folic acid  1 mg Oral Daily  . gabapentin  300 mg Oral BID  . lubiprostone  24 mcg Oral BID WC  . magnesium oxide  400 mg Oral Daily  . polyethylene glycol  17 g Oral Daily  . senna-docusate  1 tablet Oral Daily  . sodium bicarbonate  650 mg Oral BID  . sodium chloride flush  3 mL Intravenous Q12H  . tamsulosin  0.4 mg Oral Daily  . valACYclovir  1,000 mg Oral BID    Continuous Infusions: . sodium chloride 75 mL/hr at 12/22/16 1223     Time spent> 19mns  Brittannie Tawney MD, PhD  [facility] Pager [phone] If 7PM-7AM, please contact night-coverage at [email or web address], password [email or web address]/12/2016, 6:21 PM  LOS: 1 day

## 2016-12-22 NOTE — Progress Notes (Signed)
PT Cancellation Note  Patient Details Name: Francisco Love MRN: [id number] DOB: 04/03/1961   Cancelled Treatment:    Reason Eval/Treat Not Completed: Other (comment) (ptient just walked with RN.  ck back tomorrow.)   Claretha Cooper 12/22/2016, 5:11 PM Tresa Endo PT [phone]

## 2016-12-22 NOTE — Progress Notes (Signed)
T/C from Lab with Troponin result from yesterday, 12/21/2016 of 0.04. Dr. Erlinda Hong made aware of T/C.

## 2016-12-22 NOTE — Progress Notes (Signed)
  Echocardiogram 2D Echocardiogram has been performed.  Darlina Sicilian M 12/22/2016, 11:24 AM

## 2016-12-23 LAB — CBC WITH DIFFERENTIAL/PLATELET
BASOS PCT: 0 %
Basophils Absolute: 0 10*3/uL (ref 0.0–0.1)
Eosinophils Absolute: 0.3 10*3/uL (ref 0.0–0.7)
Eosinophils Relative: 2 %
HEMATOCRIT: 30.8 % — AB (ref 39.0–52.0)
HEMOGLOBIN: 10.4 g/dL — AB (ref 13.0–17.0)
LYMPHS ABS: 2.4 10*3/uL (ref 0.7–4.0)
LYMPHS PCT: 17 %
MCH: 29.5 pg (ref 26.0–34.0)
MCHC: 33.8 g/dL (ref 30.0–36.0)
MCV: 87.3 fL (ref 78.0–100.0)
MONO ABS: 1.1 10*3/uL — AB (ref 0.1–1.0)
MONOS PCT: 8 %
NEUTROS ABS: 10 10*3/uL — AB (ref 1.7–7.7)
NEUTROS PCT: 73 %
Platelets: 327 10*3/uL (ref 150–400)
RBC: 3.53 MIL/uL — ABNORMAL LOW (ref 4.22–5.81)
RDW: 14.9 % (ref 11.5–15.5)
WBC: 13.9 10*3/uL — ABNORMAL HIGH (ref 4.0–10.5)

## 2016-12-23 LAB — BASIC METABOLIC PANEL
Anion gap: 6 (ref 5–15)
BUN: 35 mg/dL — ABNORMAL HIGH (ref 6–20)
CHLORIDE: 106 mmol/L (ref 101–111)
CO2: 22 mmol/L (ref 22–32)
CREATININE: 1.69 mg/dL — AB (ref 0.61–1.24)
Calcium: 8.5 mg/dL — ABNORMAL LOW (ref 8.9–10.3)
GFR calc non Af Amer: 43 mL/min — ABNORMAL LOW (ref >60)
GFR, EST AFRICAN AMERICAN: 50 mL/min — AB (ref >60)
GLUCOSE: 91 mg/dL (ref 65–99)
Potassium: 4.6 mmol/L (ref 3.5–5.1)
Sodium: 134 mmol/L — ABNORMAL LOW (ref 135–145)

## 2016-12-23 NOTE — Evaluation (Signed)
Physical Therapy Evaluation Patient Details Name: Francisco Love MRN: [id number] DOB: 06/14/1961 Today's Date: 12/23/2016   History of Present Illness   Francisco Love is a 56 y.o. male with adm with CP, tachy; medical history significant of stage 4 NSCLC with mets to liver, pancreas.  Currently on immunotherapy with Dr. Earlie Server.  CKD stage 4.  Clinical Impression  Pt admitted with above diagnosis. Pt currently with functional limitations due to the deficits listed below (see PT Problem List).  Pt will benefit from skilled PT to increase their independence and safety with mobility to allow discharge to the venue listed below.       Follow Up Recommendations Home health PT    Equipment Recommendations  None recommended by PT    Recommendations for Other Services       Precautions / Restrictions Precautions Precautions: Fall      Mobility  Bed Mobility Overal bed mobility: Needs Assistance Bed Mobility: Supine to Sit     Supine to sit: Supervision;HOB elevated     General bed mobility comments: assist for lines, safety  Transfers Overall transfer level: Needs assistance Equipment used: None Transfers: Sit to/from Stand Sit to Stand: Min guard         General transfer comment: from bed and chair  Ambulation/Gait Ambulation/Gait assistance: Min guard Ambulation Distance (Feet): 280 Feet Assistive device: Straight cane Gait Pattern/deviations: Step-through pattern;Decreased weight shift to left Gait velocity: decr   General Gait Details: min/guard for safety, encouraged pt to use cane and not rely on IV pole; HR up to 133 during gait; sats 91% after amb on RA, O2 replaced by pt and sats 100%  Stairs            Wheelchair Mobility    Modified Rankin (Stroke Patients Only)       Balance Overall balance assessment: Needs assistance   Sitting balance-Leahy Scale: Good       Standing balance-Leahy Scale: Fair               High level  balance activites: Backward walking;Turns;Side stepping High Level Balance Comments: min/guard  for above, no overt LOB, needs incr time             Pertinent Vitals/Pain Pain Assessment: No/denies pain    Home Living Family/patient expects to be discharged to:: Private residence Living Arrangements: Alone   Type of Home: Apartment     Entrance Stairs-Number of Steps:  ? Home Layout: One level Home Equipment: Cane - single point Additional Comments: uses O2 at home prn/at night    Prior Function Level of Independence: Independent with assistive device(s);Independent         Comments: amb with cane     Hand Dominance        Extremity/Trunk Assessment   Upper Extremity Assessment Upper Extremity Assessment: Overall WFL for tasks assessed    Lower Extremity Assessment Lower Extremity Assessment: Generalized weakness       Communication   Communication: No difficulties  Cognition Arousal/Alertness: Awake/alert Behavior During Therapy: Flat affect Overall Cognitive Status: Within Functional Limits for tasks assessed                                        General Comments      Exercises     Assessment/Plan    PT Assessment Patient needs continued PT services  PT Problem List Decreased strength;Decreased  activity tolerance;Decreased balance;Decreased mobility;Cardiopulmonary status limiting activity       PT Treatment Interventions DME instruction;Gait training;Functional mobility training;Therapeutic activities;Therapeutic exercise;Patient/family education    PT Goals (Current goals can be found in the Care Plan section)  Acute Rehab PT Goals Patient Stated Goal: home soon PT Goal Formulation: With patient Time For Goal Achievement: 12/30/16 Potential to Achieve Goals: Good    Frequency Min 3X/week   Barriers to discharge        Co-evaluation               AM-PAC PT "6 Clicks" Daily Activity  Outcome Measure  Difficulty turning over in bed (including adjusting bedclothes, sheets and blankets)?: A Little Difficulty moving from lying on back to sitting on the side of the bed? : A Little Difficulty sitting down on and standing up from a chair with arms (e.g., wheelchair, bedside commode, etc,.)?: A Little Help needed moving to and from a bed to chair (including a wheelchair)?: A Little Help needed walking in hospital room?: A Little Help needed climbing 3-5 steps with a railing? : A Little 6 Click Score: 18    End of Session Equipment Utilized During Treatment: Gait belt Activity Tolerance: Patient tolerated treatment well Patient left: in bed;with call bell/phone within reach   PT Visit Diagnosis: Other abnormalities of gait and mobility (R26.89)    Time: 3709-6438 PT Time Calculation (min) (ACUTE ONLY): 21 min   Charges:   PT Evaluation $PT Eval Low Complexity: 1 Procedure     PT G Codes:           Sheree Lalla 12/23/2016, 4:50 PM

## 2016-12-23 NOTE — Progress Notes (Signed)
PROGRESS NOTE  Francisco Love [id number] DOB: [birth year] DOA: 12/21/2016 PCP: Sherlynn Stalls., MD  HPI/Recap of past 24 hours:  Report feeling dizzy and DOE for two months, frequent presyncope episodes No lower extremity edema Today , he report feeling better  Assessment/Plan: Principal Problem:   Acute respiratory failure with hypoxia ([place]) Active Problems:   Non-small cell cancer of right lung (HCC)   Tachycardia   Tachypnea   Metabolic acidosis, NAG, bicarbonate losses   CKD (chronic kidney disease) stage 4, GFR 15-29 ml/min (HCC)   1. Acute respiratory failure with hypoxia - tachycardia, tachypnea, and DOE VQscan low probability, no lower extremity edema, d/c heparin drip, troponin negative, echo lvef wnl 2. NSCLC stage 4 with liver and pancreatic mets - chemo patient of Dr. Earlie Server on immunotherapy, just had this on 5/2 1. Tumor 4.8 cm on todays non-contrast CT chest at [facility], was 3.8x5.0cm on our CT in March for comparison 2. Has had headache for several months,  MRI brain in Jan was negative at [facility]. 3. NAG metabolic acidosis - 1. Chronic since at least 2016 2. continue PO bicarb BID  Hyponatremia: likely from dehydration, continue ivf CKD stage 4 - chronic and stable since 2016 Prostate cancer on xtandi/casodex FTT: need PT Suspect ongoing dizziness /frequent presyncope episodes, DOE are multifacotrial including stage IV lung cancer, metastatic prostate cancer, dehydration, deconditioning, will continue hydration, ambulate, may need home health He report he walks with a cane  Consider outpatient holter monitor, he does has tendency to have sinus tachycardia upon standing up.  DVT prophylaxis: lovenox  Code Status: Full Family Communication: sister and brother in law at bedside Disposition Plan: home with home health on 5/7 Consults called: None   Procedures:  VQ scan  Antibiotics:  none   Objective: BP 116/79 (BP Location:  Right Arm)   Pulse 75   Temp 97.8 F (36.6 C) (Oral)   Resp 18   Ht '5\' 9"'$  (1.753 m)   Wt 71.3 kg (157 lb 3 oz)   SpO2 100%   BMI 23.21 kg/m   Intake/Output Summary (Last 24 hours) at 12/23/16 1127 Last data filed at 12/23/16 1018  Gross per 24 hour  Intake          2483.75 ml  Output             2780 ml  Net          -296.25 ml   Filed Weights   12/21/16 0158  Weight: 71.3 kg (157 lb 3 oz)    Exam:   General:  Frail, chronically ill appearing  Cardiovascular: RRR  Respiratory: CTABL  Abdomen: Soft/ND/NT, positive BS  Musculoskeletal: No Edema  Neuro: alert, follow commands  Data Reviewed: Basic Metabolic Panel:  Recent Labs Lab 12/19/16 0759 12/21/16 0450 12/22/16 0519 12/23/16 0523  NA 136 139 133* 134*  K 4.4 4.4 4.6 4.6  CL  --  111 106 106  CO2 18* 16* 21* 22  GLUCOSE 114 111* 92 91  BUN 35.4* 34* 33* 35*  CREATININE 1.8* 1.97* 1.85* 1.69*  CALCIUM 9.0 8.8* 8.4* 8.5*   Liver Function Tests:  Recent Labs Lab 12/19/16 0759  AST 11  ALT 8  ALKPHOS 172*  BILITOT 0.23  PROT 6.5  ALBUMIN 2.4*   No results for input(s): LIPASE, AMYLASE in the last 168 hours. No results for input(s): AMMONIA in the last 168 hours. CBC:  Recent Labs Lab 12/19/16 0759 12/21/16 0450 12/22/16 9562  12/23/16 0523  WBC 13.3* 11.7* 13.5* 13.9*  NEUTROABS 10.3*  --  9.5* 10.0*  HGB 11.6* 11.0* 10.0* 10.4*  HCT 34.5* 33.3* 29.7* 30.8*  MCV 88.9 89.3 87.6 87.3  PLT 374 350 336 327   Cardiac Enzymes:    Recent Labs Lab 12/21/16 0510 12/21/16 1238 12/21/16 1659  TROPONINI <0.03 <0.03 0.04*   BNP (last 3 results) No results for input(s): BNP in the last 8760 hours.  ProBNP (last 3 results) No results for input(s): PROBNP in the last 8760 hours.  CBG: No results for input(s): GLUCAP in the last 168 hours.  No results found for this or any previous visit (from the past 240 hour(s)).   Studies: No results found.  Scheduled Meds: . bicalutamide   50 mg Oral Daily  . dexamethasone  4 mg Oral BID WC  . enoxaparin (LOVENOX) injection  40 mg Subcutaneous Q24H  . feeding supplement (ENSURE ENLIVE)  237 mL Oral BID BM  . folic acid  1 mg Oral Daily  . gabapentin  300 mg Oral BID  . lubiprostone  24 mcg Oral BID WC  . magnesium oxide  400 mg Oral Daily  . polyethylene glycol  17 g Oral Daily  . senna-docusate  1 tablet Oral Daily  . sodium bicarbonate  650 mg Oral BID  . sodium chloride flush  3 mL Intravenous Q12H  . tamsulosin  0.4 mg Oral Daily  . valACYclovir  1,000 mg Oral BID    Continuous Infusions: . sodium chloride 75 mL/hr at 12/22/16 1223     Time spent> 53mns  Aubreigh Fuerte MD, PhD  [facility] Pager [phone] If 7PM-7AM, please contact night-coverage at [email or web address], password [email or web address] [facility]5/01/2017, 11:27 AM  LOS: 2 days

## 2016-12-24 ENCOUNTER — Inpatient Hospital Stay (HOSPITAL_COMMUNITY): Payer: Medicare Other

## 2016-12-24 ENCOUNTER — Encounter (HOSPITAL_COMMUNITY): Payer: Self-pay | Admitting: Cardiology

## 2016-12-24 DIAGNOSIS — I471 Supraventricular tachycardia, unspecified: Secondary | ICD-10-CM | POA: Diagnosis not present

## 2016-12-24 DIAGNOSIS — I5031 Acute diastolic (congestive) heart failure: Secondary | ICD-10-CM

## 2016-12-24 LAB — BASIC METABOLIC PANEL
Anion gap: 5 (ref 5–15)
Anion gap: 8 (ref 5–15)
BUN: 45 mg/dL — AB (ref 6–20)
BUN: 43 mg/dL — AB (ref 6–20)
CHLORIDE: 104 mmol/L (ref 101–111)
CO2: 25 mmol/L (ref 22–32)
CO2: 22 mmol/L (ref 22–32)
Calcium: 8.1 mg/dL — ABNORMAL LOW (ref 8.9–10.3)
Calcium: 8.5 mg/dL — ABNORMAL LOW (ref 8.9–10.3)
Chloride: 106 mmol/L (ref 101–111)
Creatinine, Ser: 1.8 mg/dL — ABNORMAL HIGH (ref 0.61–1.24)
Creatinine, Ser: 1.87 mg/dL — ABNORMAL HIGH (ref 0.61–1.24)
GFR calc Af Amer: 46 mL/min — ABNORMAL LOW (ref >60)
GFR calc Af Amer: 44 mL/min — ABNORMAL LOW (ref >60)
GFR, EST NON AFRICAN AMERICAN: 40 mL/min — AB (ref >60)
GFR, EST NON AFRICAN AMERICAN: 38 mL/min — AB (ref >60)
GLUCOSE: 93 mg/dL (ref 65–99)
GLUCOSE: 108 mg/dL — AB (ref 65–99)
POTASSIUM: 5.2 mmol/L — AB (ref 3.5–5.1)
POTASSIUM: 4.8 mmol/L (ref 3.5–5.1)
Sodium: 136 mmol/L (ref 135–145)
Sodium: 134 mmol/L — ABNORMAL LOW (ref 135–145)

## 2016-12-24 LAB — CBC WITH DIFFERENTIAL/PLATELET
BASOS ABS: 0 10*3/uL (ref 0.0–0.1)
BASOS PCT: 0 %
Eosinophils Absolute: 0.5 10*3/uL (ref 0.0–0.7)
Eosinophils Relative: 4 %
HEMATOCRIT: 29.5 % — AB (ref 39.0–52.0)
HEMOGLOBIN: 9.7 g/dL — AB (ref 13.0–17.0)
LYMPHS PCT: 14 %
Lymphs Abs: 1.8 10*3/uL (ref 0.7–4.0)
MCH: 29.5 pg (ref 26.0–34.0)
MCHC: 32.9 g/dL (ref 30.0–36.0)
MCV: 89.7 fL (ref 78.0–100.0)
MONOS PCT: 9 %
Monocytes Absolute: 1.1 10*3/uL — ABNORMAL HIGH (ref 0.1–1.0)
NEUTROS ABS: 9.3 10*3/uL — AB (ref 1.7–7.7)
NEUTROS PCT: 73 %
PLATELETS: 309 10*3/uL (ref 150–400)
RBC: 3.29 MIL/uL — ABNORMAL LOW (ref 4.22–5.81)
RDW: 14.9 % (ref 11.5–15.5)
WBC: 12.8 10*3/uL — ABNORMAL HIGH (ref 4.0–10.5)

## 2016-12-24 MED ORDER — METOPROLOL TARTRATE 25 MG PO TABS: 25.0000 mg | ORAL_TABLET | Freq: Two times a day (BID) | ORAL | Status: DC

## 2016-12-24 MED ORDER — METOPROLOL TARTRATE 25 MG PO TABS: 12.5000 mg | ORAL_TABLET | Freq: Two times a day (BID) | ORAL | 0 refills | Status: DC

## 2016-12-24 MED ORDER — FUROSEMIDE 20 MG PO TABS: 20.0000 mg | ORAL_TABLET | Freq: Every day | ORAL | 0 refills | Status: AC | PRN

## 2016-12-24 MED ORDER — DIPHENHYDRAMINE HCL 12.5 MG/5ML PO ELIX
12.5000 mg | ORAL_SOLUTION | Freq: Three times a day (TID) | ORAL | Status: DC | PRN
  Administered 2016-12-24: 12.5 mg via ORAL
  Filled 2016-12-24: qty 5

## 2016-12-24 MED ORDER — FUROSEMIDE 20 MG PO TABS: 20.0000 mg | ORAL_TABLET | Freq: Every day | ORAL | Status: DC

## 2016-12-24 MED ORDER — SODIUM BICARBONATE 650 MG PO TABS: 650.0000 mg | ORAL_TABLET | Freq: Every day | ORAL | 0 refills | Status: AC

## 2016-12-24 MED ORDER — METOPROLOL TARTRATE 25 MG PO TABS
12.5000 mg | ORAL_TABLET | Freq: Two times a day (BID) | ORAL | Status: DC
  Administered 2016-12-24: 12.5 mg via ORAL
  Filled 2016-12-24: qty 1

## 2016-12-24 MED ORDER — ENSURE ENLIVE PO LIQD: 237.0000 mL | Freq: Two times a day (BID) | ORAL | 12 refills | Status: DC

## 2016-12-24 NOTE — Consult Note (Signed)
Reason for Consult:   Tachycardia, DOE  Requesting Physician: Dr Erlinda Hong Primary Cardiologist new  HPI:   Francisco Love is a 56 y.o. male who is being seen today for the evaluation of PSVT and DOE at the request of Dr Erlinda Hong  Asked to see this 56 y/o AA male with a history of [place] lung cancer with a large hilar mass, mets to liver, pancreas, and bone, and prostate cancer. He is followed by Dr Julien Nordmann. He is being treated with immunotherapy. LOV with Dr Julien Nordmann was 12/19/16. His note indicates the pt was seen at an outside ED with tachycardia and hypoxia and was felt to be dehydrated. He apparently was seen again 12/21/16 at an outside facility with the same complaints and transferred to [facility] ED.  CT scan from outside hospital reportedly showed no pneumonia, but showed small right-sided pleural effusion, central emphysematous change, mediastinal mass 4.8 cm. VQ here was negative for PE. Troponin negative x 3.   The pt describes exertional SOB and tachycardia for 2 months. He also say he feels chest pressure-no radiation to his arms or jaw. He says when he goes to his truck he can hardly make it back to the house. On telemetry he has had sinus tachycardia and one run of 11 beats PSVT.   PMHx:  Past Medical History:  Diagnosis Date  . Chronic fatigue 04/12/2016  . Chronic pain 04/12/2016  . Chronic renal disease, stage III   . Metastatic cancer [facility])     Past Surgical History:  Procedure Laterality Date  . APPENDECTOMY    . VIDEO BRONCHOSCOPY N/A 09/21/2014   Procedure: VIDEO BRONCHOSCOPY WITH FLUORO;  Surgeon: Rigoberto Noel, MD;  Location: [place];  Service: Cardiopulmonary;  Laterality: N/A;  . VIDEO BRONCHOSCOPY WITH ENDOBRONCHIAL ULTRASOUND N/A 09/23/2014   Procedure: VIDEO BRONCHOSCOPY WITH ENDOBRONCHIAL ULTRASOUND;  Surgeon: Rigoberto Noel, MD;  Location: [place];  Service: Pulmonary;  Laterality: N/A;    SOCHx:  reports that he quit smoking about 2 years ago. His smoking use included  Cigarettes. He smoked 1.00 pack per day. He has never used smokeless tobacco. He reports that he drinks about 3.6 oz of alcohol per week . He reports that he uses drugs, including Marijuana.  FAMHx: Mother died "old age" Father killed in [place]. No FM Hx of DMH, HTN, or CAD  ALLERGIES: No Known Allergies  ROS: Review of Systems: General: negative for chills, fever, night sweats or weight changes.  Cardiovascular: negative for edema, orthopnea, palpitations, paroxysmal nocturnal dyspnea  HEENT: negative for any visual disturbances, blindness, glaucoma Dermatological: negative for rash Respiratory: negative for cough, hemoptysis, or wheezing Urologic: negative for hematuria or dysuria Abdominal: negative for nausea, vomiting, diarrhea, bright red blood per rectum, melena, or hematemesis Neurologic: negative for visual changes, syncope, or dizziness Musculoskeletal: negative for back pain, joint pain, or swelling Psych: cooperative and appropriate All other systems reviewed and are otherwise negative except as noted above.   HOME MEDICATIONS: Prior to Admission medications   Medication Sig Start Date End Date Taking? Authorizing Provider  acetaminophen (TYLENOL) 325 MG tablet Take 2 tablets (650 mg total) by mouth every 6 (six) hours as needed for mild pain (or Fever >/= 101). 09/24/14  Yes Delfina Redwood, MD  albuterol (PROVENTIL) (2.5 MG/3ML) 0.083% nebulizer solution Take 3 mLs (2.5 mg total) by nebulization every 2 (two) hours as needed for wheezing. 09/24/14  Yes Delfina Redwood, MD  AMITIZA 24 MCG capsule Take 24 mcg  by mouth 2 (two) times daily with a meal.  04/06/15  Yes [provider]  bicalutamide (CASODEX) 50 MG tablet Take 50 mg by mouth daily.  09/08/15  Yes [provider]  dexamethasone (DECADRON) 4 MG tablet Take 4 mg by mouth 2 (two) times daily with a meal.   Yes [provider]  diphenhydrAMINE (BENADRYL) 25 MG tablet Take 25 mg by mouth at  bedtime as needed for itching.    Yes [provider]  folic acid (FOLVITE) 1 MG tablet Take 1 mg by mouth daily.   Yes [provider]  lidocaine-prilocaine (EMLA) cream Apply 1 application topically as needed. Apply to port site prior to chemotherapy. 11/07/16  Yes Curt Bears, MD  magnesium oxide (MAG-OX) 400 MG tablet Take 400 mg by mouth daily.   Yes [provider]  mirtazapine (REMERON) 15 MG tablet Take 1 tablet (15 mg total) by mouth at bedtime. 12/19/16  Yes Curt Bears, MD  oxyCODONE-acetaminophen (PERCOCET/ROXICET) 5-325 MG tablet Take 1 tablet by mouth every 4 (four) hours as needed for severe pain. 12/19/16  Yes Curt Bears, MD  polyethylene glycol [facility] Floria Raveling) packet Take 17 g by mouth daily as needed for mild constipation.    Yes [provider]  prochlorperazine (COMPAZINE) 10 MG tablet Take 1 tablet (10 mg total) by mouth every 6 (six) hours as needed for nausea or vomiting. 10/22/14  Yes Curt Bears, MD  senna-docusate (SENOKOT-S) 8.6-50 MG per tablet Take 1 tablet by mouth daily. 10/19/14  Yes Lauree Chandler, NP  SUMAtriptan (IMITREX) 25 MG tablet Take 25 mg by mouth every 2 (two) hours as needed for migraine.  05/31/16  Yes [provider]  tamsulosin (FLOMAX) 0.4 MG CAPS capsule Take 0.4 mg by mouth daily.  03/29/15  Yes [provider]  valACYclovir (VALTREX) 1000 MG tablet Take 1,000 mg by mouth 2 (two) times daily.   Yes [provider]  XTANDI 40 MG capsule Take 160 mg by mouth daily.  07/24/16  Yes [provider]  levocetirizine (XYZAL) 5 MG tablet Take 5 mg by mouth. 11/30/15 11/29/16  [provider]    HOSPITAL MEDICATIONS: I have reviewed the patient's current medications.  VITALS: Blood pressure 113/69, pulse 83, temperature 97.9 F (36.6 C), temperature source Oral, resp. rate 18, height '5\' 9"'$  (1.753 m), weight 157 lb 3 oz (71.3 kg), SpO2 97 %.  PHYSICAL  EXAM: General appearance: alert, cooperative and no distress Neck: no carotid bruit and no JVD Lungs: decreased breath sounds Lt base Heart: regular rate and rhythm Abdomen: soft, non-tender; bowel sounds normal; no masses,  no organomegaly and surgical scar midline Extremities: extremities normal, atraumatic, no cyanosis or edema Pulses: 2+ and symmetric Skin: Skin color, texture, turgor normal. No rashes or lesions Neurologic: Grossly normal  LABS: Results for orders placed or performed during the hospital encounter of 12/21/16 (from the past 24 hour(s))  CBC with Differential/Platelet     Status: Abnormal   Collection Time: 12/24/16  4:40 AM  Result Value Ref Range   WBC 12.8 (H) 4.0 - 10.5 K/uL   RBC 3.29 (L) 4.22 - 5.81 MIL/uL   Hemoglobin 9.7 (L) 13.0 - 17.0 g/dL   HCT 29.5 (L) 39.0 - 52.0 %   MCV 89.7 78.0 - 100.0 fL   MCH 29.5 26.0 - 34.0 pg   MCHC 32.9 30.0 - 36.0 g/dL   RDW 14.9 11.5 - 15.5 %   Platelets 309 150 -  400 K/uL   Neutrophils Relative % 73 %   Neutro Abs 9.3 (H) 1.7 - 7.7 K/uL   Lymphocytes Relative 14 %   Lymphs Abs 1.8 0.7 - 4.0 K/uL   Monocytes Relative 9 %   Monocytes Absolute 1.1 (H) 0.1 - 1.0 K/uL   Eosinophils Relative 4 %   Eosinophils Absolute 0.5 0.0 - 0.7 K/uL   Basophils Relative 0 %   Basophils Absolute 0.0 0.0 - 0.1 K/uL  Basic metabolic panel     Status: Abnormal   Collection Time: 12/24/16  4:40 AM  Result Value Ref Range   Sodium 136 135 - 145 mmol/L   Potassium 5.2 (H) 3.5 - 5.1 mmol/L   Chloride 106 101 - 111 mmol/L   CO2 25 22 - 32 mmol/L   Glucose, Bld 93 65 - 99 mg/dL   BUN 45 (H) 6 - 20 mg/dL   Creatinine, Ser 1.80 (H) 0.61 - 1.24 mg/dL   Calcium 8.1 (L) 8.9 - 10.3 mg/dL   GFR calc non Af Amer 40 (L) >60 mL/min   GFR calc Af Amer 46 (L) >60 mL/min   Anion gap 5 5 - 15    EKG: not done  Telemetry: NSR, ST, one run of 11 bts PSVT, personally reviewed  Echo: Study Conclusions  - Left ventricle: The cavity size was  normal. There was mild   concentric hypertrophy. Systolic function was normal. The   estimated ejection fraction was in the range of 55% to 60%. Wall   motion was normal; there were no regional wall motion   abnormalities. Doppler parameters are consistent with abnormal   left ventricular relaxation (grade 1 diastolic dysfunction). The   E/e&' ratio is <8, suggesting normal LV filling pressure. - Left atrium: The atrium was normal in size. - Inferior vena cava: The vessel was normal in size. The   respirophasic diameter changes were in the normal range (>= 50%),   consistent with normal central venous pressure.  Impressions:  - LVEF 55-60%, mild LVH, normal wall motion, grade 1 DD with normal   LV filling pressure, normal LA size, normal IVC.  IMAGING: No CXR done this admission. VQ low prob 12/21/16    IMPRESSION: Principal Problem:   Acute respiratory failure with hypoxia (HCC) Active Problems:   Metastatic adenocarcinoma involving skeletal bone   Non-small cell cancer of right lung    Tachycardia   Tachypnea   Chest pressure   Metabolic acidosis, NAG, bicarbonate losses   CKD (chronic kidney disease) stage 3   PSVT (paroxysmal supraventricular tachycardia) (HCC)  RECOMMENDATION: MD to see. Incidental finding of PSVT- doubt this is causing his symptoms. Add low dose beta blocker. Check CXR- CT reportedly showed Rt effusion, he has Lt effusion on exam. Recent TSH in April WNL.  Not sure ischemic work up warranted.   Time Spent Directly with Patient: 27 minutes  Kerin Ransom, Christine beeper 12/24/2016, 8:57 AM    The patient was seen, examined and discussed with Kerin Ransom, PA-C and I agree with the above.   56 y/o AA male with a history of [place] lung cancer with a large hilar mass, mets to liver, pancreas, and bone, and prostate cancer followed by Dr Julien Nordmann. He is being treated with immunotherapy. He was admitted with tachycardia and dehydration,  CT scan from  outside hospital reportedly showed no pneumonia, but showed small right-sided pleural effusion, central emphysematous change, mediastinal mass 4.8 cm. VQ here was negative for PE.  Troponin negative x 3.  Telemetry was personally reviewed and showed 1 run of SVT - possibly atrial tachycardia with 11 beats, this was asymptomatic.  The pt describes exertional SOB and tachycardia for 2 months. He also say he feels chest pressure-no radiation to his arms or jaw.  ECG shows SR, LVH, negative T waves in the inferolateral leads.  TTE showed LVEF 55-60%, mild LVH, normal wall motion, grade 1 DD with normal LV filling pressure, normal LA size, normal IVC. He was started on metoprolol 12.5 mg po BID, I would increase to 25 mg po BID.   I would not proceed with ischemic workup right now, he has SOB, that can be sec to stage 4 lung cancer, he also has stage 4 CKD and anemia and has overall poor prognosis. LVEF is preserved. We will follow in the clinic. Start lasix 20 mg po daily.   He can be discharged from cardiac standpoint.  Ena Dawley, MD 12/24/2016

## 2016-12-24 NOTE — Progress Notes (Signed)
Pharmacy IV to PO conversion  The patient is receiving diphenhydramine by the intravenous route.  Based on the following criteria approved by the Pharmacy and [place], diphenhydramine is being converted to the equivalent oral dose form.   Not prescribed to treat or prevent a severe allergic reaction   Not prescribed as premedication prior to receiving blood product, biologic medication, antimicrobial, or chemotherapy agent   The patient has tolerated at least one dose of an oral or enteral medication   The patient has no evidence of active gastrointestinal bleeding or impaired GI absorption (gastrectomy, short bowel, patient on TNA or NPO).   The patient is not undergoing procedural sedation  If you have any questions about this conversion, please contact the Pharmacy Department (ext [phone]).  Thank you.  Reuel Boom, PharmD Pager: [phone] 12/24/2016, 8:41 AM

## 2016-12-24 NOTE — Progress Notes (Signed)
Spoke with pt concerning discharge plans.  Pt asked that his sister Izora Gala be called [phone].  A call was made to [facility] with no answer, VM left, waiting for answer.

## 2016-12-24 NOTE — Progress Notes (Signed)
SATURATION QUALIFICATIONS: (This note is used to comply with regulatory documentation for home oxygen)  Patient Saturations on Room Air at Rest = 93%  Patient Saturations on Room Air while Ambulating = 74%  Patient Saturations on 2 Liters of oxygen while Ambulating = 97%  Please briefly explain why patient needs home oxygen: Patient desaturates to 74% O2 on room air while ambulating.

## 2016-12-24 NOTE — Discharge Summary (Signed)
Discharge Summary  Francisco Love KCL:[id number] DOB: September 09, 1960  PCP: Francisco Stalls., MD  Admit date: 12/21/2016 Discharge date: 12/24/2016  Time spent: >37mns, more than 50% time spent on coordination of care  Recommendations for Outpatient Follow-up:  1. F/u with PMD within a week  for hospital discharge follow up, repeat cbc/bmp at follow up 2. f/u with oncology 3. f/u with cardiology 4. Home health arranged  Discharge Diagnoses:  Active Hospital Problems   Diagnosis Date Noted  . [place] with [place] 09/23/2014  . PSVT ([place]oxysmal [place]ricular tachycardia) ([facility] 12/24/2016  . Tachycardia 12/21/2016  . Tachypnea 12/21/2016  . Metabolic acidosis, NAG, bicarbonate losses 12/21/2016  . CKD (chronic kidney disease) stage 4, 12/21/2016  . Non-small cell cancer of right lung ([facility] 10/06/2014  . 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     Resolved Hospital Problems   Diagnosis Date Noted Date Resolved  No resolved problems to display.    Discharge Condition: stable  Diet recommendation: heart healthy  Filed Weights   12/21/16 0158  Weight: 71.3 kg (157 lb 3 oz)    History of present illness:  PCP: SIgnacia Felling KMichell Heinrich MD  Patient coming from: Home  I have personally briefly reviewed [place] Chief Complaint: DOE  HPI: LRonell Duffusis a 56y.o. male with medical history significant of stage 4 NSCLC with mets to liver, pancreas.  Currently on immunotherapy with Dr. MEarlie Server  CKD stage 4.  [facility]  Associated pleuritic chest pain, tachycardia up to 100-130.  Desaturates to 80% on ambulation.  Symptoms ongoing for the past day or so.  Better at rest, worse with exertion.  He becomes short of breath just standing up and walking he says.   ED Course: D.Dimer 0.70.  negative  troponin, WBC 13.1, BNP 230, temperature normal, currently oxygen saturation 98% on 2 L nasal cannula, stable renal function, CT chest without contrast showed no pneumonia, but showed small right-sided pleural effusion, central emphysematous change, mediastinal mass 4.8 cm.  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 V/Q scan low probability, he does not have lower extremity edema, troponin negative, echo lvef wnl, no wall motion abnormalities, heparin drip stopped. cxr no acute infiltrate, low clinical suspicion for pneumonia, no fever, no cough, he does has leukocytosis but he has been on steroid chronically.  Symptom has improved at discharge.   Suspect ongoing dizziness /frequent presyncope episodes, DOE are multifacotrial including stage IV lung cancer, metastatic prostate cancer, dehydration, deconditioning, he does has tendency of sinus tachycardia upon standing up he received hydration, physical therapy, home health   PSVT: he has one episode of PSVT, ekg with sinus  rhythm, does has diffused st/t depression /inversion, cardiology consulted, patient is started on low dose lopressor, outpatient follow up with cardiology to consider Ischemic work up or  outpatient holter monitor?  Cardiology input  appreciated  Hyponatremia: likely from dehydration, improved with ivf  Chronic leukocytosis: likely steroid induced.  CKD stage 4 - chronic and stable since 8242  NAG metabolic acidosis - Chronic since at least 2016, continue PO bicarb   Metastatic castration resistant prostate cancer, with bone metastases on xtandi/casodex, followed [facility]  NSCLC stage 4 with liver and pancreatic mets - chemo patient of Dr. Earlie Server on immunotherapy, just had this on 5/2 1. Tumor 4.8 cm on todays non-contrast CT chest at [facility], was 3.8x5.0cm on our CT in March for comparison 2. Has had headache for several months,  MRI brain in Jan was negative at [facility].  Lichen planus : c/o diffuse itch, prn benadryl   FTT: need PT, home health   Procedures:  VQ scan  Consultations:  cardiology  Discharge Exam: BP 113/69 (BP Location: Right Arm)   Pulse 83   Temp 97.9 F (36.6 C) (Oral)   Resp 18   Ht '5\' 9"'$  (1.753 m)   Wt 71.3 kg (157 lb 3 oz)   SpO2 97% Comment: while ambulating  BMI 23.21 kg/m    General:  Frail, chronically ill appearing  Cardiovascular: RRR  Respiratory: CTABL  Abdomen: Soft/ND/NT, positive BS  Musculoskeletal: No Edema  Neuro: alert, follow commands   Discharge Instructions You were cared for by a hospitalist during your hospital stay. If you have any questions about your discharge medications or the care you received while you were in the hospital after you are discharged, you can call the unit and asked to speak with the hospitalist on call if the hospitalist that took care of you is not available. Once you are discharged, your primary care physician will handle any further medical issues. Please note that NO REFILLS for any discharge medications will be authorized once you are discharged, as it is imperative that you return to your primary care physician (or establish a relationship with a primary care physician if you do not have one) for your aftercare  needs so that they can reassess your need for medications and monitor your lab values.  Discharge Instructions    Diet - low sodium heart healthy    Complete by:  As directed    Face-to-face encounter (required for Medicare/Medicaid patients)    Complete by:  As directed    I Ellorie Kindall certify that this patient is under my care and that I, or a nurse practitioner or physician's assistant working with me, had a face-to-face encounter that meets the physician face-to-face encounter requirements with this patient on 12/24/2016. The encounter with the patient was in whole, or in part for the following medical condition(s) which is the primary reason for home health care (List medical condition): FTT   The encounter with the patient was in whole, or in part, for the following medical condition, which is the primary reason for home health care:  FTT   I certify that, based on my findings, the following services are medically necessary home health services:   Nursing Physical therapy     Reason for Medically Necessary Home Health Services:  Skilled Nursing- Change/Decline in Patient Status   My clinical findings support the need for the above services:  Shortness of breath with activity   Further, I certify that my clinical findings support  that this patient is homebound due to:  Unsafe ambulation due to balance issues   For home use only DME oxygen    Complete by:  As directed    Mode or (Route):  Nasal cannula   Liters per Minute:  2   Frequency:  Continuous (stationary and portable oxygen unit needed)   Oxygen conserving device:  Yes   Oxygen delivery system:  Gas   Home Health    Complete by:  As directed    To provide the following care/treatments:   PT OT RN Social work     Increase activity slowly    Complete by:  As directed      Allergies as of 12/24/2016   No Known Allergies     Medication List    TAKE these medications   acetaminophen 325 MG tablet Commonly known as:   TYLENOL Take 2 tablets (650 mg total) by mouth every 6 (six) hours as needed for mild pain (or Fever >/= 101).   albuterol (2.5 MG/3ML) 0.083% nebulizer solution Commonly known as:  PROVENTIL Take 3 mLs (2.5 mg total) by nebulization every 2 (two) hours as needed for wheezing.   AMITIZA 24 MCG capsule Generic drug:  lubiprostone Take 24 mcg by mouth 2 (two) times daily with a meal.   CASODEX 50 MG tablet Generic drug:  bicalutamide Take 50 mg by mouth daily.   dexamethasone 4 MG tablet Commonly known as:  DECADRON Take 4 mg by mouth 2 (two) times daily with a meal.   diphenhydrAMINE 25 MG tablet Commonly known as:  BENADRYL Take 25 mg by mouth at bedtime as needed for itching.   feeding supplement (ENSURE ENLIVE) Liqd Take 237 mLs by mouth 2 (two) times daily between meals.   folic acid 1 MG tablet Commonly known as:  FOLVITE Take 1 mg by mouth daily.   furosemide 20 MG tablet Commonly known as:  LASIX Take 1 tablet (20 mg total) by mouth daily as needed for fluid or edema.   levocetirizine 5 MG tablet Commonly known as:  XYZAL Take 5 mg by mouth.   lidocaine-prilocaine cream Commonly known as:  EMLA Apply 1 application topically as needed. Apply to port site prior to chemotherapy.   magnesium oxide 400 MG tablet Commonly known as:  MAG-OX Take 400 mg by mouth daily.   metoprolol tartrate 25 MG tablet Commonly known as:  LOPRESSOR Take 0.5 tablets (12.5 mg total) by mouth 2 (two) times daily.   mirtazapine 15 MG tablet Commonly known as:  REMERON Take 1 tablet (15 mg total) by mouth at bedtime.   oxyCODONE-acetaminophen 5-325 MG tablet Commonly known as:  PERCOCET/ROXICET Take 1 tablet by mouth every 4 (four) hours as needed for severe pain.   polyethylene glycol packet Commonly known as:  MIRALAX / GLYCOLAX Take 17 g by mouth daily as needed for mild constipation.   prochlorperazine 10 MG tablet Commonly known as:  COMPAZINE Take 1 tablet (10 mg total)  by mouth every 6 (six) hours as needed for nausea or vomiting.   senna-docusate 8.6-50 MG tablet Commonly known as:  Senokot-S Take 1 tablet by mouth daily.   sodium bicarbonate 650 MG tablet Take 1 tablet (650 mg total) by mouth daily.   SUMAtriptan 25 MG tablet Commonly known as:  IMITREX Take 25 mg by mouth every 2 (two) hours as needed for migraine.   tamsulosin 0.4 MG Caps capsule Commonly known as:  FLOMAX Take 0.4 mg by mouth daily.  valACYclovir 1000 MG tablet Commonly known as:  VALTREX Take 1,000 mg by mouth 2 (two) times daily.   XTANDI 40 MG capsule Generic drug:  enzalutamide Take 160 mg by mouth daily.            Durable Medical Equipment        Start     Ordered   12/24/16 0000  For home use only DME oxygen    Question Answer Comment  Mode or (Route) Nasal cannula   Liters per Minute 2   Frequency Continuous (stationary and portable oxygen unit needed)   Oxygen conserving device Yes   Oxygen delivery system Gas      12/24/16 1558     No Known Allergies Follow-up Information    Francisco Stalls., MD Follow up in 1 week(s).   Specialty:  Internal Medicine Why:  hospital discharge follow up, repeat cbc/bmp at follow up Contact information: [place] [address] [phone]        Curt Bears, MD Follow up on 01/02/2017.   Specialty:  Oncology Contact information: [place] [address] [phone]        Dorothy Spark, MD Follow up.   Specialty:  Cardiology Why:  office will contact you Contact information: [place] [address] [phone]            The results of significant diagnostics from this hospitalization (including imaging, microbiology, ancillary and laboratory) are listed below for reference.    Significant Diagnostic Studies: Nm Pulmonary Perf And Vent  Result Date: 12/21/2016 CLINICAL DATA:  56 year old male with  shortness of breath and positive D-dimer. Non-small cell lung cancer. EXAM: NUCLEAR MEDICINE VENTILATION - PERFUSION LUNG SCAN TECHNIQUE: Ventilation images were obtained in multiple projections using inhaled aerosol Tc-82mDTPA. Perfusion images were obtained in multiple projections after intravenous injection of Tc-965mAA. RADIOPHARMACEUTICALS:  32.3 mCi Technetium-9959mPA aerosol inhalation and 5.2 mCi Technetium-61m64m IV COMPARISON:  Chest abdomen and pelvis CT without contrast 10/24/2016 FINDINGS: Ventilation: Suboptimal ventilation imaging, but no discrete ventilation defect identified. Perfusion: No perfusion defect identified. IMPRESSION: No perfusion defect to suggest acute pulmonary embolus. Electronically Signed   By: H  HGenevie Ann.   On: 12/21/2016 11:31   Dg Chest Port 1 View  Result Date: 12/24/2016 CLINICAL DATA:  Dyspnea.  Stage IV lung cancer. EXAM: PORTABLE CHEST 1 VIEW COMPARISON:  None. FINDINGS: Borderline cardiomegaly is noted. No pneumothorax or significant pleural effusion is noted. Minimal bibasilar subsegmental atelectasis is noted. Right internal jugular Port-A-Cath is noted with distal tip in expected position of right atrium. Bony thorax is unremarkable. IMPRESSION: Minimal bibasilar subsegmental atelectasis. Electronically Signed   By: JameMarijo ConceptionD.   On: 12/24/2016 10:53    Microbiology: No results found for this or any previous visit (from the past 240 hour(s)).   Labs: Basic Metabolic Panel:  Recent Labs Lab 12/21/16 0450 12/22/16 0519 12/23/16 0523 12/24/16 0440 12/24/16 0849  NA 139 133* 134* 136 134*  K 4.4 4.6 4.6 5.2* 4.8  CL 111 106 106 106 104  CO2 16* 21* '22 25 22  '$ GLUCOSE 111* 92 91 93 108*  BUN 34* 33* 35* 45* 43*  CREATININE 1.97* 1.85* 1.69* 1.80* 1.87*  CALCIUM 8.8* 8.4* 8.5* 8.1* 8.5*   Liver Function Tests:  Recent Labs Lab 12/19/16 0759  AST 11  ALT 8  ALKPHOS 172*  BILITOT 0.23  PROT 6.5  ALBUMIN  2.4*   No results for  input(s): LIPASE, AMYLASE in the last 168 hours. No results for input(s): AMMONIA in the last 168 hours. CBC:  Recent Labs Lab 12/19/16 0759 12/21/16 0450 12/22/16 0519 12/23/16 0523 12/24/16 0440  WBC 13.3* 11.7* 13.5* 13.9* 12.8*  NEUTROABS 10.3*  --  9.5* 10.0* 9.3*  HGB 11.6* 11.0* 10.0* 10.4* 9.7*  HCT 34.5* 33.3* 29.7* 30.8* 29.5*  MCV 88.9 89.3 87.6 87.3 89.7  PLT 374 350 336 327 309   Cardiac Enzymes:  Recent Labs Lab 12/21/16 0510 12/21/16 1238 12/21/16 1659  TROPONINI <0.03 <0.03 0.04*   BNP: BNP (last 3 results) No results for input(s): BNP in the last 8760 hours.  ProBNP (last 3 results) No results for input(s): PROBNP in the last 8760 hours.  CBG: No results for input(s): GLUCAP in the last 168 hours.     SignedFlorencia Reasons MD, PhD  [facility] 12/24/2016, 4:03 PM

## 2016-12-24 NOTE — Progress Notes (Signed)
Pt's sister selected [facility]. Referral given to in house rep.

## 2016-12-25 ENCOUNTER — Telehealth: Payer: Self-pay | Admitting: Internal Medicine

## 2016-12-25 NOTE — Progress Notes (Signed)
Pt's home O2 is from [facility]. A call was made to [facility] for O2 tank. Pt's sister agreed to pick O2 tank up there for pt to transport home.

## 2016-12-25 NOTE — Telephone Encounter (Signed)
Appts already scheduled for 5/2 los. Only need a f/u on 5/30 with MM - no availability - messaged Dr. Julien Nordmann .

## 2016-12-26 ENCOUNTER — Telehealth: Payer: Self-pay | Admitting: Internal Medicine

## 2016-12-26 NOTE — Telephone Encounter (Signed)
Called brother Terald Sleeper [phone] per Epic comments - left a message to see if Patient is able to come in for f/u on 5/31.

## 2016-12-27 ENCOUNTER — Telehealth: Payer: Self-pay | Admitting: Internal Medicine

## 2016-12-27 NOTE — Telephone Encounter (Signed)
Called patients brother - unable to do thursdays and does not want to do afternoon appts.

## 2017-01-02 ENCOUNTER — Other Ambulatory Visit (HOSPITAL_BASED_OUTPATIENT_CLINIC_OR_DEPARTMENT_OTHER): Payer: Medicare Other

## 2017-01-02 ENCOUNTER — Telehealth: Payer: Self-pay | Admitting: Internal Medicine

## 2017-01-02 ENCOUNTER — Encounter: Payer: Self-pay | Admitting: Internal Medicine

## 2017-01-02 ENCOUNTER — Ambulatory Visit (HOSPITAL_BASED_OUTPATIENT_CLINIC_OR_DEPARTMENT_OTHER): Payer: Medicare Other | Admitting: Internal Medicine

## 2017-01-02 ENCOUNTER — Ambulatory Visit (HOSPITAL_BASED_OUTPATIENT_CLINIC_OR_DEPARTMENT_OTHER): Payer: Medicare Other

## 2017-01-02 VITALS — HR 100

## 2017-01-02 DIAGNOSIS — C3401 Malignant neoplasm of right main bronchus: Secondary | ICD-10-CM

## 2017-01-02 DIAGNOSIS — C787 Secondary malignant neoplasm of liver and intrahepatic bile duct: Secondary | ICD-10-CM | POA: Diagnosis not present

## 2017-01-02 DIAGNOSIS — Z5112 Encounter for antineoplastic immunotherapy: Secondary | ICD-10-CM

## 2017-01-02 DIAGNOSIS — G893 Neoplasm related pain (acute) (chronic): Secondary | ICD-10-CM

## 2017-01-02 DIAGNOSIS — C3491 Malignant neoplasm of unspecified part of right bronchus or lung: Secondary | ICD-10-CM

## 2017-01-02 DIAGNOSIS — C7951 Secondary malignant neoplasm of bone: Secondary | ICD-10-CM

## 2017-01-02 DIAGNOSIS — Z79899 Other long term (current) drug therapy: Secondary | ICD-10-CM | POA: Diagnosis not present

## 2017-01-02 DIAGNOSIS — C7889 Secondary malignant neoplasm of other digestive organs: Secondary | ICD-10-CM | POA: Diagnosis not present

## 2017-01-02 LAB — CBC WITH DIFFERENTIAL/PLATELET
BASO%: 0.9 % (ref 0.0–2.0)
Basophils Absolute: 0.1 10*3/uL (ref 0.0–0.1)
EOS%: 3.5 % (ref 0.0–7.0)
Eosinophils Absolute: 0.5 10*3/uL (ref 0.0–0.5)
HCT: 33.7 % — ABNORMAL LOW (ref 38.4–49.9)
HEMOGLOBIN: 11.1 g/dL — AB (ref 13.0–17.1)
LYMPH#: 2.7 10*3/uL (ref 0.9–3.3)
LYMPH%: 17.6 % (ref 14.0–49.0)
MCH: 30.4 pg (ref 27.2–33.4)
MCHC: 32.9 g/dL (ref 32.0–36.0)
MCV: 92.4 fL (ref 79.3–98.0)
MONO#: 1.4 10*3/uL — AB (ref 0.1–0.9)
MONO%: 9.1 % (ref 0.0–14.0)
NEUT%: 68.9 % (ref 39.0–75.0)
NEUTROS ABS: 10.4 10*3/uL — AB (ref 1.5–6.5)
Platelets: 384 10*3/uL (ref 140–400)
RBC: 3.64 10*6/uL — AB (ref 4.20–5.82)
RDW: 15.8 % — AB (ref 11.0–14.6)
WBC: 15.2 10*3/uL — ABNORMAL HIGH (ref 4.0–10.3)

## 2017-01-02 LAB — COMPREHENSIVE METABOLIC PANEL
ALBUMIN: 2.5 g/dL — AB (ref 3.5–5.0)
ALK PHOS: 144 U/L (ref 40–150)
ALT: 15 U/L (ref 0–55)
AST: 10 U/L (ref 5–34)
Anion Gap: 10 mEq/L (ref 3–11)
BUN: 41.7 mg/dL — AB (ref 7.0–26.0)
CO2: 19 meq/L — AB (ref 22–29)
Calcium: 9.1 mg/dL (ref 8.4–10.4)
Chloride: 108 mEq/L (ref 98–109)
Creatinine: 2.4 mg/dL — ABNORMAL HIGH (ref 0.7–1.3)
EGFR: 33 mL/min/{1.73_m2} — AB (ref >90)
Glucose: 82 mg/dl (ref 70–140)
POTASSIUM: 4.5 meq/L (ref 3.5–5.1)
Sodium: 137 mEq/L (ref 136–145)
TOTAL PROTEIN: 7.2 g/dL (ref 6.4–8.3)
Total Bilirubin: <0.22 mg/dL (ref 0.20–1.20)

## 2017-01-02 LAB — TSH: TSH: 2.573 m(IU)/L (ref 0.320–4.118)

## 2017-01-02 MED ORDER — OXYCODONE-ACETAMINOPHEN 5-325 MG PO TABS: 1.0000 | ORAL_TABLET | ORAL | 0 refills | Status: DC | PRN

## 2017-01-02 MED ORDER — SODIUM CHLORIDE 0.9 % IJ SOLN
10.0000 mL | INTRAMUSCULAR | Status: DC | PRN
  Administered 2017-01-02: 10 mL
  Filled 2017-01-02: qty 10

## 2017-01-02 MED ORDER — SODIUM CHLORIDE 0.9 % IV SOLN
Freq: Once | INTRAVENOUS | Status: AC
  Administered 2017-01-02: 09:00:00 via INTRAVENOUS

## 2017-01-02 MED ORDER — SODIUM CHLORIDE 0.9 % IV SOLN
240.0000 mg | Freq: Once | INTRAVENOUS | Status: AC
  Administered 2017-01-02: 240 mg via INTRAVENOUS
  Filled 2017-01-02: qty 24

## 2017-01-02 MED ORDER — HEPARIN SOD (PORK) LOCK FLUSH 100 UNIT/ML IV SOLN
500.0000 [IU] | Freq: Once | INTRAVENOUS | Status: AC | PRN
  Administered 2017-01-02: 500 [IU]
  Filled 2017-01-02: qty 5

## 2017-01-02 NOTE — Progress Notes (Signed)
Verbal order from Dr. Julien Nordmann "[facility] to treat with cmet and cbc lab resutls". All lab results have been reviewed by MD.

## 2017-01-02 NOTE — Progress Notes (Signed)
Ok to treat with kidney function labs (BUN and Creat) elevation today per [facility].

## 2017-01-02 NOTE — Telephone Encounter (Signed)
Lab, Chemo and follow up with Dr Julien Nordmann, scheduled every 2 weeks up throughout 02/13/17. No additional appointments scheduled at this time.  Patient was given a copy of the AVS report and appointment schedule per 01/02/17 los. 2 bottles of Contrast was given to patient with a copy of the instructions, per CT ordered.

## 2017-01-02 NOTE — Patient Instructions (Signed)
[facility] Discharge Instructions for Patients Receiving Chemotherapy  Today you received the following chemotherapy agents:  Nivolumab.  To help prevent nausea and vomiting after your treatment, we encourage you to take your nausea medication as directed.   If you develop nausea and vomiting that is not controlled by your nausea medication, call the clinic.   BELOW ARE SYMPTOMS THAT SHOULD BE REPORTED IMMEDIATELY:  *FEVER GREATER THAN 100.5 F  *CHILLS WITH OR WITHOUT FEVER  NAUSEA AND VOMITING THAT IS NOT CONTROLLED WITH YOUR NAUSEA MEDICATION  *UNUSUAL SHORTNESS OF BREATH  *UNUSUAL BRUISING OR BLEEDING  TENDERNESS IN MOUTH AND THROAT WITH OR WITHOUT PRESENCE OF ULCERS  *URINARY PROBLEMS  *BOWEL PROBLEMS  UNUSUAL RASH Items with * indicate a potential emergency and should be followed up as soon as possible.  Feel free to call the clinic you have any questions or concerns. The clinic phone number is [phone].  Please show the CHEMO ALERT CARD at check-in to the Emergency Department and triage nurse.   

## 2017-01-02 NOTE — Addendum Note (Signed)
Addended by: Neysa Hotter on: 01/02/2017 01:50 PM   Modules accepted: Orders

## 2017-01-02 NOTE — Progress Notes (Signed)
[place] Telephone:([phone]   [phone]  OFFICE PROGRESS NOTE  Sherlynn Stalls., MD [place] [address]  DIAGNOSIS:  1) Stage IV (T2b, N2, M1b) non-small cell lung cancer, adenocarcinoma with negative EGFR mutation and negative gene translocation presented with a large right hilar mass as well as mediastinal lymphadenopathy and metastatic disease to the liver, bone and pancreas diagnosed in February 2016. 2) prostate adenocarcinoma diagnosed at [facility] with Gleason score 9 (4+5).  PRIOR THERAPY: Systemic chemotherapy with carboplatin for AUC of 5 and Alimta 500 MG/M2 every 3 weeks. Status post 6 cycles.  CURRENT THERAPY:  1) Immunotherapy with Nivolumab 240 MG every 2 weeks, status post 42 cycles. 2) Enzalutamide for prostate cancer at [place]: Francisco Love 56 y.o. male returns to the clinic today for follow-up visit accompanied by his brother-in-law. The patient is feeling fine today with no specific complaints. He fell down a week ago after a dizzy spells and he was seen at the local emergency room and has imaging of the brain. We will call to give the results of this brain scan. He denied having any current chest pain, shortness breath, cough or hemoptysis. He denied having any fever or chills. He has no nausea, vomiting, diarrhea or constipation. He has no recurrence of the previous skin rash. He is currently on rehabilitation facility in [facility]. He is here today for evaluation before starting cycle #43 of his treatment.  MEDICAL HISTORY: Past Medical History:  Diagnosis Date  . Chronic fatigue 04/12/2016  . Chronic pain 04/12/2016  . Chronic renal disease, stage III   . Metastatic cancer ([place])     ALLERGIES:  has No Known Allergies.  MEDICATIONS:  Current Outpatient Prescriptions  Medication Sig Dispense Refill  . acetaminophen (TYLENOL) 325 MG tablet Take 2  tablets (650 mg total) by mouth every 6 (six) hours as needed for mild pain (or Fever >/= 101).    Marland Kitchen albuterol (PROVENTIL) (2.5 MG/3ML) 0.083% nebulizer solution Take 3 mLs (2.5 mg total) by nebulization every 2 (two) hours as needed for wheezing. 75 mL 12  . AMITIZA 24 MCG capsule Take 24 mcg by mouth 2 (two) times daily with a meal.     . bicalutamide (CASODEX) 50 MG tablet Take 50 mg by mouth daily.     Marland Kitchen dexamethasone (DECADRON) 4 MG tablet Take 4 mg by mouth 2 (two) times daily with a meal.    . diphenhydrAMINE (BENADRYL) 25 MG tablet Take 25 mg by mouth at bedtime as needed for itching.     . feeding supplement, ENSURE ENLIVE, (ENSURE ENLIVE) LIQD Take 237 mLs by mouth 2 (two) times daily between meals. 811 mL 12  . folic acid (FOLVITE) 1 MG tablet Take 1 mg by mouth daily.    . furosemide (LASIX) 20 MG tablet Take 1 tablet (20 mg total) by mouth daily as needed for fluid or edema. 30 tablet 0  . lidocaine-prilocaine (EMLA) cream Apply 1 application topically as needed. Apply to port site prior to chemotherapy. 30 g 0  . magnesium oxide (MAG-OX) 400 MG tablet Take 400 mg by mouth daily.    . metoprolol tartrate (LOPRESSOR) 25 MG tablet Take 0.5 tablets (12.5 mg total) by mouth 2 (two) times daily. 60 tablet 0  . mirtazapine (REMERON) 15 MG tablet Take 1 tablet (15 mg total) by mouth at bedtime.    Marland Kitchen  oxyCODONE-acetaminophen (PERCOCET/ROXICET) 5-325 MG tablet Take 1 tablet by mouth every 4 (four) hours as needed for severe pain. 60 tablet 0  . polyethylene glycol (MIRALAX / GLYCOLAX) packet Take 17 g by mouth daily as needed for mild constipation.     . prochlorperazine (COMPAZINE) 10 MG tablet Take 1 tablet (10 mg total) by mouth every 6 (six) hours as needed for nausea or vomiting. 30 tablet 1  . senna-docusate (SENOKOT-S) 8.6-50 MG per tablet Take 1 tablet by mouth daily.    . sodium bicarbonate 650 MG tablet Take 1 tablet (650 mg total) by mouth daily. 30 tablet 0  . SUMAtriptan (IMITREX)  25 MG tablet Take 25 mg by mouth every 2 (two) hours as needed for migraine.     . tamsulosin (FLOMAX) 0.4 MG CAPS capsule Take 0.4 mg by mouth daily.     . valACYclovir (VALTREX) 1000 MG tablet Take 1,000 mg by mouth 2 (two) times daily.    [name] 40 MG capsule Take 160 mg by mouth daily.     Marland Kitchen levocetirizine (XYZAL) 5 MG tablet Take 5 mg by mouth.     No current facility-administered medications for this visit.     SURGICAL HISTORY:  Past Surgical History:  Procedure Laterality Date  . APPENDECTOMY    . VIDEO BRONCHOSCOPY N/A 09/21/2014   Procedure: VIDEO BRONCHOSCOPY WITH FLUORO;  Surgeon: Rigoberto Noel, MD;  Location: [place];  Service: Cardiopulmonary;  Laterality: N/A;  . VIDEO BRONCHOSCOPY WITH ENDOBRONCHIAL ULTRASOUND N/A 09/23/2014   Procedure: VIDEO BRONCHOSCOPY WITH ENDOBRONCHIAL ULTRASOUND;  Surgeon: Rigoberto Noel, MD;  Location: [place];  Service: Pulmonary;  Laterality: N/A;    REVIEW OF SYSTEMS:  A comprehensive review of systems was negative except for: Constitutional: positive for fatigue Musculoskeletal: positive for muscle weakness   PHYSICAL EXAMINATION: General appearance: alert, cooperative, fatigued and no distress Head: Normocephalic, without obvious abnormality, atraumatic Neck: no adenopathy, no JVD, supple, symmetrical, trachea midline and thyroid not enlarged, symmetric, no tenderness/mass/nodules Lymph nodes: Cervical, supraclavicular, and axillary nodes normal. Resp: clear to auscultation bilaterally Back: symmetric, no curvature. ROM normal. No CVA tenderness. Cardio: regular rate and rhythm, S1, S2 normal, no murmur, click, rub or gallop GI: soft, non-tender; bowel sounds normal; no masses,  no organomegaly Extremities: extremities normal, atraumatic, no cyanosis or edema   ECOG PERFORMANCE STATUS: 1 - Symptomatic but completely ambulatory  Blood pressure 111/78, pulse (!) 106, temperature 97.6 F (36.4 C), temperature source Oral, resp. rate 18,  height _0  (1.753 m), weight 166 lb 9.6 oz (75.6 kg), SpO2 98 %.  LABORATORY DATA: Lab Results  Component Value Date   WBC 15.2 (H) 01/02/2017   HGB 11.1 (L) 01/02/2017   HCT 33.7 (L) 01/02/2017   MCV 92.4 01/02/2017   PLT 384 01/02/2017      Chemistry      Component Value Date/Time   NA 134 (L) 12/24/2016 0849   NA 136 12/19/2016 0759   K 4.8 12/24/2016 0849   K 4.4 12/19/2016 0759   CL 104 12/24/2016 0849   CO2 22 12/24/2016 0849   CO2 18 (L) 12/19/2016 0759   BUN 43 (H) 12/24/2016 0849   BUN 35.4 (H) 12/19/2016 0759   CREATININE 1.87 (H) 12/24/2016 0849   CREATININE 1.8 (H) 12/19/2016 0759      Component Value Date/Time   CALCIUM 8.5 (L) 12/24/2016 0849   CALCIUM 9.0 12/19/2016 0759   ALKPHOS 172 (H) 12/19/2016 0759   AST 11 12/19/2016  0759   ALT 8 12/19/2016 0759   BILITOT 0.23 12/19/2016 0759       RADIOGRAPHIC STUDIES: Nm Pulmonary Perf And Vent  Result Date: 12/21/2016 CLINICAL DATA:  56 year old male with shortness of breath and positive D-dimer. Non-small cell lung cancer. EXAM: NUCLEAR MEDICINE VENTILATION - PERFUSION LUNG SCAN TECHNIQUE: Ventilation images were obtained in multiple projections using inhaled aerosol Tc-51mDTPA. Perfusion images were obtained in multiple projections after intravenous injection of Tc-967mAA. RADIOPHARMACEUTICALS:  32.3 mCi Technetium-9934mPA aerosol inhalation and 5.2 mCi Technetium-16m87m IV COMPARISON:  Chest abdomen and pelvis CT without contrast 10/24/2016 FINDINGS: Ventilation: Suboptimal ventilation imaging, but no discrete ventilation defect identified. Perfusion: No perfusion defect identified. IMPRESSION: No perfusion defect to suggest acute pulmonary embolus. Electronically Signed   By: H  HGenevie Ann.   On: 12/21/2016 11:31   Dg Chest Port 1 View  Result Date: 12/24/2016 CLINICAL DATA:  Dyspnea.  Stage IV lung cancer. EXAM: PORTABLE CHEST 1 VIEW COMPARISON:  None. FINDINGS: Borderline cardiomegaly is noted. No  pneumothorax or significant pleural effusion is noted. Minimal bibasilar subsegmental atelectasis is noted. Right internal jugular Port-A-Cath is noted with distal tip in expected position of right atrium. Bony thorax is unremarkable. IMPRESSION: Minimal bibasilar subsegmental atelectasis. Electronically Signed   By: JameMarijo ConceptionD.   On: 12/24/2016 10:53   ASSESSMENT AND PLAN:  This is a very pleasant 55 y89rs old African-American male with metastatic non-small cell lung cancer, adenocarcinoma status post induction systemic chemotherapy with carboplatin and Alimta and he is currently on second line treatment with immunotherapy with Nivolumab status post 42 cycles. He tolerated the last cycle of his treatment well with no significant adverse effects. I recommended for the patient to proceed with cycle #43 today as scheduled. I will see him back for follow-up visit in 2 weeks for reevaluation with repeat CT scan of the chest, abdomen and pelvis for restaging of his disease. For the pain management he was given a refill of Percocet today. The patient was advised to call immediately if he has any concerning symptoms in the interval. The patient voices understanding of current disease status and treatment options and is in agreement with the current care plan. All questions were answered. The patient knows to call the clinic with any problems, questions or concerns. We can certainly see the patient much sooner if necessary. I spent 10 minutes counseling the patient face to face. The total time spent in the appointment was 15 minutes.  Disclaimer: This note was dictated with voice recognition software. Similar sounding words can inadvertently be transcribed and may not be corrected upon review.

## 2017-01-16 ENCOUNTER — Other Ambulatory Visit (HOSPITAL_BASED_OUTPATIENT_CLINIC_OR_DEPARTMENT_OTHER): Payer: Medicare Other

## 2017-01-16 ENCOUNTER — Ambulatory Visit (HOSPITAL_BASED_OUTPATIENT_CLINIC_OR_DEPARTMENT_OTHER): Payer: Medicare Other

## 2017-01-16 ENCOUNTER — Telehealth: Payer: Self-pay | Admitting: Internal Medicine

## 2017-01-16 ENCOUNTER — Ambulatory Visit (HOSPITAL_BASED_OUTPATIENT_CLINIC_OR_DEPARTMENT_OTHER): Payer: Medicare Other | Admitting: Internal Medicine

## 2017-01-16 ENCOUNTER — Encounter: Payer: Medicare Other | Admitting: Nutrition

## 2017-01-16 ENCOUNTER — Encounter: Payer: Self-pay | Admitting: Internal Medicine

## 2017-01-16 ENCOUNTER — Ambulatory Visit: Payer: Medicare Other | Admitting: Cardiology

## 2017-01-16 DIAGNOSIS — C3401 Malignant neoplasm of right main bronchus: Secondary | ICD-10-CM

## 2017-01-16 DIAGNOSIS — G893 Neoplasm related pain (acute) (chronic): Secondary | ICD-10-CM

## 2017-01-16 DIAGNOSIS — C7889 Secondary malignant neoplasm of other digestive organs: Secondary | ICD-10-CM

## 2017-01-16 DIAGNOSIS — C787 Secondary malignant neoplasm of liver and intrahepatic bile duct: Secondary | ICD-10-CM

## 2017-01-16 DIAGNOSIS — C61 Malignant neoplasm of prostate: Secondary | ICD-10-CM | POA: Diagnosis not present

## 2017-01-16 DIAGNOSIS — C3491 Malignant neoplasm of unspecified part of right bronchus or lung: Secondary | ICD-10-CM

## 2017-01-16 DIAGNOSIS — Z5112 Encounter for antineoplastic immunotherapy: Secondary | ICD-10-CM

## 2017-01-16 DIAGNOSIS — Z79899 Other long term (current) drug therapy: Secondary | ICD-10-CM

## 2017-01-16 DIAGNOSIS — C7951 Secondary malignant neoplasm of bone: Secondary | ICD-10-CM | POA: Diagnosis not present

## 2017-01-16 LAB — COMPREHENSIVE METABOLIC PANEL
ALBUMIN: 2.9 g/dL — AB (ref 3.5–5.0)
ALK PHOS: 119 U/L (ref 40–150)
ALT: 15 U/L (ref 0–55)
AST: 11 U/L (ref 5–34)
Anion Gap: 13 mEq/L — ABNORMAL HIGH (ref 3–11)
BUN: 72.1 mg/dL — AB (ref 7.0–26.0)
CO2: 21 mEq/L — ABNORMAL LOW (ref 22–29)
Calcium: 9.2 mg/dL (ref 8.4–10.4)
Chloride: 104 mEq/L (ref 98–109)
Creatinine: 2.1 mg/dL — ABNORMAL HIGH (ref 0.7–1.3)
EGFR: 40 mL/min/{1.73_m2} — ABNORMAL LOW (ref >90)
GLUCOSE: 123 mg/dL (ref 70–140)
POTASSIUM: 4.9 meq/L (ref 3.5–5.1)
SODIUM: 138 meq/L (ref 136–145)
TOTAL PROTEIN: 6.6 g/dL (ref 6.4–8.3)
Total Bilirubin: 0.23 mg/dL (ref 0.20–1.20)

## 2017-01-16 LAB — CBC WITH DIFFERENTIAL/PLATELET
BASO%: 0.2 % (ref 0.0–2.0)
Basophils Absolute: 0 10*3/uL (ref 0.0–0.1)
EOS%: 0.2 % (ref 0.0–7.0)
Eosinophils Absolute: 0 10*3/uL (ref 0.0–0.5)
HCT: 34 % — ABNORMAL LOW (ref 38.4–49.9)
HGB: 11.3 g/dL — ABNORMAL LOW (ref 13.0–17.1)
LYMPH%: 11.2 % — AB (ref 14.0–49.0)
MCH: 31.4 pg (ref 27.2–33.4)
MCHC: 33.3 g/dL (ref 32.0–36.0)
MCV: 94.3 fL (ref 79.3–98.0)
MONO#: 0.8 10*3/uL (ref 0.1–0.9)
MONO%: 5.2 % (ref 0.0–14.0)
NEUT#: 12.6 10*3/uL — ABNORMAL HIGH (ref 1.5–6.5)
NEUT%: 83.2 % — AB (ref 39.0–75.0)
Platelets: 338 10*3/uL (ref 140–400)
RBC: 3.61 10*6/uL — AB (ref 4.20–5.82)
RDW: 19.8 % — ABNORMAL HIGH (ref 11.0–14.6)
WBC: 15.1 10*3/uL — ABNORMAL HIGH (ref 4.0–10.3)
lymph#: 1.7 10*3/uL (ref 0.9–3.3)

## 2017-01-16 LAB — TECHNOLOGIST REVIEW

## 2017-01-16 LAB — TSH: TSH: 1.262 m(IU)/L (ref 0.320–4.118)

## 2017-01-16 MED ORDER — NIVOLUMAB CHEMO INJECTION 100 MG/10ML
240.0000 mg | Freq: Once | INTRAVENOUS | Status: AC
  Administered 2017-01-16: 240 mg via INTRAVENOUS
  Filled 2017-01-16: qty 24

## 2017-01-16 MED ORDER — OXYCODONE-ACETAMINOPHEN 5-325 MG PO TABS
1.0000 | ORAL_TABLET | ORAL | 0 refills | Status: DC | PRN
Start: 2017-01-16 — End: 2017-01-30

## 2017-01-16 MED ORDER — SODIUM CHLORIDE 0.9 % IV SOLN
Freq: Once | INTRAVENOUS | Status: AC
  Administered 2017-01-16: 10:00:00 via INTRAVENOUS

## 2017-01-16 MED ORDER — HEPARIN SOD (PORK) LOCK FLUSH 100 UNIT/ML IV SOLN
500.0000 [IU] | Freq: Once | INTRAVENOUS | Status: AC | PRN
  Administered 2017-01-16: 500 [IU]
  Filled 2017-01-16: qty 5

## 2017-01-16 MED ORDER — SODIUM CHLORIDE 0.9 % IJ SOLN
10.0000 mL | INTRAMUSCULAR | Status: DC | PRN
  Administered 2017-01-16: 10 mL
  Filled 2017-01-16: qty 10

## 2017-01-16 NOTE — Telephone Encounter (Signed)
Lab, Chemo and follow up with Dr Julien Nordmann scheduled every 2 weeks, per 01/16/17 los. 2 additional treatments added.  Patient was given a copy of the AVS report and appointment schedule, per 01/16/17 los.

## 2017-01-16 NOTE — Progress Notes (Signed)
Okay to treat today despite abnormal labs, per Dr. Julien Nordmann.

## 2017-01-16 NOTE — Progress Notes (Signed)
[place] [phone]   [phone]) [phone]  OFFICE PROGRESS NOTE  Sherlynn Stalls., MD [place] [address]  DIAGNOSIS:  1) Stage IV (T2b, N2, M1b) non-small cell lung cancer, adenocarcinoma with negative EGFR mutation and negative gene translocation presented with a large right hilar mass as well as mediastinal lymphadenopathy and metastatic disease to the liver, bone and pancreas diagnosed in February 2016. 2) prostate adenocarcinoma diagnosed at [facility] with Gleason score 9 (4+5).  PRIOR THERAPY: Systemic chemotherapy with carboplatin for AUC of 5 and Alimta 500 MG/M2 every 3 weeks. Status post 6 cycles.  CURRENT THERAPY:  1) Immunotherapy with Nivolumab 240 MG every 2 weeks, status post 43 cycles. 2) Enzalutamide for prostate cancer at [place]: Francisco Love 56 y.o. male returns to the clinic today for follow-up visit accompanied by his brother-in-law. The patient is feeling fine today with no specific complaints except for generalized fatigue. He denied having any chest pain but has shortness breath with exertion was no cough or hemoptysis. He denied having any weight loss or night sweats. He has no nausea, vomiting, diarrhea or constipation. His PSA was rising recently and his oncologist at [facility] is expecting to change his treatment. The patient was supposed to have repeat CT scan of the chest, abdomen and pelvis before this visit but unfortunately it is scheduled for tomorrow. He is here today for evaluation before starting cycle #44.  MEDICAL HISTORY: Past Medical History:  Diagnosis Date  . Chronic fatigue 04/12/2016  . Chronic pain 04/12/2016  . Chronic renal disease, stage III   . Metastatic cancer ([place])     ALLERGIES:  has No Known Allergies.  MEDICATIONS:  Current Outpatient Prescriptions  Medication Sig Dispense Refill  . acetaminophen (TYLENOL) 325 MG tablet Take 2  tablets (650 mg total) by mouth every 6 (six) hours as needed for mild pain (or Fever >/= 101).    Marland Kitchen albuterol (PROVENTIL) (2.5 MG/3ML) 0.083% nebulizer solution Take 3 mLs (2.5 mg total) by nebulization every 2 (two) hours as needed for wheezing. 75 mL 12  . AMITIZA 24 MCG capsule Take 24 mcg by mouth 2 (two) times daily with a meal.     . bicalutamide (CASODEX) 50 MG tablet Take 50 mg by mouth daily.     . diphenhydrAMINE (BENADRYL) 25 MG tablet Take 25 mg by mouth at bedtime as needed for itching.     . feeding supplement, ENSURE ENLIVE, (ENSURE ENLIVE) LIQD Take 237 mLs by mouth 2 (two) times daily between meals. 127 mL 12  . folic acid (FOLVITE) 1 MG tablet Take 1 mg by mouth daily.    . furosemide (LASIX) 20 MG tablet Take 1 tablet (20 mg total) by mouth daily as needed for fluid or edema. 30 tablet 0  . lidocaine-prilocaine (EMLA) cream Apply 1 application topically as needed. Apply to port site prior to chemotherapy. 30 g 0  . magnesium oxide (MAG-OX) 400 MG tablet Take 400 mg by mouth daily.    . metoprolol tartrate (LOPRESSOR) 25 MG tablet Take 0.5 tablets (12.5 mg total) by mouth 2 (two) times daily. 60 tablet 0  . mirtazapine (REMERON) 15 MG tablet Take 1 tablet (15 mg total) by mouth at bedtime.    Marland Kitchen oxyCODONE-acetaminophen (PERCOCET/ROXICET) 5-325 MG tablet Take 1 tablet by mouth every 4 (four) hours as needed for severe pain. 60 tablet 0  . polyethylene  glycol (MIRALAX / GLYCOLAX) packet Take 17 g by mouth daily as needed for mild constipation.     . prochlorperazine (COMPAZINE) 10 MG tablet Take 1 tablet (10 mg total) by mouth every 6 (six) hours as needed for nausea or vomiting. 30 tablet 1  . senna-docusate (SENOKOT-S) 8.6-50 MG per tablet Take 1 tablet by mouth daily.    . sodium bicarbonate 650 MG tablet Take 1 tablet (650 mg total) by mouth daily. 30 tablet 0  . SUMAtriptan (IMITREX) 25 MG tablet Take 25 mg by mouth every 2 (two) hours as needed for migraine.     . tamsulosin  (FLOMAX) 0.4 MG CAPS capsule Take 0.4 mg by mouth daily.     [name] 40 MG capsule Take 160 mg by mouth daily.     Marland Kitchen levocetirizine (XYZAL) 5 MG tablet Take 5 mg by mouth.    . valACYclovir (VALTREX) 1000 MG tablet Take 1,000 mg by mouth 2 (two) times daily.     No current facility-administered medications for this visit.     SURGICAL HISTORY:  Past Surgical History:  Procedure Laterality Date  . APPENDECTOMY    . VIDEO BRONCHOSCOPY N/A 09/21/2014   Procedure: VIDEO BRONCHOSCOPY WITH FLUORO;  Surgeon: Rigoberto Noel, MD;  Location: [place];  Service: Cardiopulmonary;  Laterality: N/A;  . VIDEO BRONCHOSCOPY WITH ENDOBRONCHIAL ULTRASOUND N/A 09/23/2014   Procedure: VIDEO BRONCHOSCOPY WITH ENDOBRONCHIAL ULTRASOUND;  Surgeon: Rigoberto Noel, MD;  Location: [place];  Service: Pulmonary;  Laterality: N/A;    REVIEW OF SYSTEMS:  A comprehensive review of systems was negative except for: Constitutional: positive for fatigue Respiratory: positive for dyspnea on exertion Musculoskeletal: positive for muscle weakness   PHYSICAL EXAMINATION: General appearance: alert, cooperative, fatigued and no distress Head: Normocephalic, without obvious abnormality, atraumatic Neck: no adenopathy, no JVD, supple, symmetrical, trachea midline and thyroid not enlarged, symmetric, no tenderness/mass/nodules Lymph nodes: Cervical, supraclavicular, and axillary nodes normal. Resp: clear to auscultation bilaterally Back: symmetric, no curvature. ROM normal. No CVA tenderness. Cardio: regular rate and rhythm, S1, S2 normal, no murmur, click, rub or gallop GI: soft, non-tender; bowel sounds normal; no masses,  no organomegaly Extremities: extremities normal, atraumatic, no cyanosis or edema   ECOG PERFORMANCE STATUS: 1 - Symptomatic but completely ambulatory  Blood pressure 134/73, pulse 97, temperature 98 F (36.7 C), temperature source Oral, resp. rate 18, height 5' 9"  (1.753 m), weight 181 lb 11.2 oz (82.4 kg),  SpO2 94 %.  LABORATORY DATA: Lab Results  Component Value Date   WBC 15.1 (H) 01/16/2017   HGB 11.3 (L) 01/16/2017   HCT 34.0 (L) 01/16/2017   MCV 94.3 01/16/2017   PLT 338 01/16/2017      Chemistry      Component Value Date/Time   NA 137 01/02/2017 0805   K 4.5 01/02/2017 0805   CL 104 12/24/2016 0849   CO2 19 (L) 01/02/2017 0805   BUN 41.7 (H) 01/02/2017 0805   CREATININE 2.4 (H) 01/02/2017 0805      Component Value Date/Time   CALCIUM 9.1 01/02/2017 0805   ALKPHOS 144 01/02/2017 0805   AST 10 01/02/2017 0805   ALT 15 01/02/2017 0805   BILITOT <0.22 01/02/2017 0805       RADIOGRAPHIC STUDIES: Nm Pulmonary Perf And Vent  Result Date: 12/21/2016 CLINICAL DATA:  56 year old male with shortness of breath and positive D-dimer. Non-small cell lung cancer. EXAM: NUCLEAR MEDICINE VENTILATION - PERFUSION LUNG SCAN TECHNIQUE: Ventilation images were obtained in multiple  projections using inhaled aerosol Tc-44mDTPA. Perfusion images were obtained in multiple projections after intravenous injection of Tc-934mAA. RADIOPHARMACEUTICALS:  32.3 mCi Technetium-9970mPA aerosol inhalation and 5.2 mCi Technetium-62m70m IV COMPARISON:  Chest abdomen and pelvis CT without contrast 10/24/2016 FINDINGS: Ventilation: Suboptimal ventilation imaging, but no discrete ventilation defect identified. Perfusion: No perfusion defect identified. IMPRESSION: No perfusion defect to suggest acute pulmonary embolus. Electronically Signed   By: H  HGenevie Ann.   On: 12/21/2016 11:31   Dg Chest Port 1 View  Result Date: 12/24/2016 CLINICAL DATA:  Dyspnea.  Stage IV lung cancer. EXAM: PORTABLE CHEST 1 VIEW COMPARISON:  None. FINDINGS: Borderline cardiomegaly is noted. No pneumothorax or significant pleural effusion is noted. Minimal bibasilar subsegmental atelectasis is noted. Right internal jugular Port-A-Cath is noted with distal tip in expected position of right atrium. Bony thorax is unremarkable. IMPRESSION:  Minimal bibasilar subsegmental atelectasis. Electronically Signed   By: JameMarijo ConceptionD.   On: 12/24/2016 10:53   ASSESSMENT AND PLAN:  This is a very pleasant 55 y29rs old African-American male with metastatic non-small cell lung cancer, adenocarcinoma status post induction systemic chemotherapy was carboplatin and Alimta and currently on second line treatment with Nivolumab status post 43 cycles. The patient is tolerating his treatment well. I recommended for him to proceed with cycle #44 today. He is expected to have repeat CT scan of the chest, abdomen and pelvis tomorrow. Regarding the rising PSA, he is followed by oncology at [facility] his expectoration that he may change his treatment. He would come back for follow-up visit in 2 weeks for evaluation before starting cycle #45. For pain management he was given a refill of Percocet today. The patient voices understanding of current disease status and treatment options and is in agreement with the current care plan. All questions were answered. The patient knows to call the clinic with any problems, questions or concerns. We can certainly see the patient much sooner if necessary. I spent 10 minutes counseling the patient face to face. The total time spent in the appointment was 15 minutes.  Disclaimer: This note was dictated with voice recognition software. Similar sounding words can inadvertently be transcribed and may not be corrected upon review.

## 2017-01-16 NOTE — Patient Instructions (Addendum)
[facility] Discharge Instructions for Patients Receiving Chemotherapy  Today you received the following chemotherapy agents:  Opdivo  To help prevent nausea and vomiting after your treatment, we encourage you to take your nausea medication as prescribed.   If you develop nausea and vomiting that is not controlled by your nausea medication, call the clinic.   BELOW ARE SYMPTOMS THAT SHOULD BE REPORTED IMMEDIATELY:  *FEVER GREATER THAN 100.5 F  *CHILLS WITH OR WITHOUT FEVER  NAUSEA AND VOMITING THAT IS NOT CONTROLLED WITH YOUR NAUSEA MEDICATION  *UNUSUAL SHORTNESS OF BREATH  *UNUSUAL BRUISING OR BLEEDING  TENDERNESS IN MOUTH AND THROAT WITH OR WITHOUT PRESENCE OF ULCERS  *URINARY PROBLEMS  *BOWEL PROBLEMS  UNUSUAL RASH Items with * indicate a potential emergency and should be followed up as soon as possible.  Feel free to call the clinic you have any questions or concerns. The clinic phone number is [phone].  Please show the CHEMO ALERT CARD at check-in to the Emergency Department and triage nurse.   

## 2017-01-17 ENCOUNTER — Encounter (HOSPITAL_COMMUNITY): Payer: Self-pay

## 2017-01-17 ENCOUNTER — Ambulatory Visit (HOSPITAL_COMMUNITY)
Admission: RE | Admit: 2017-01-17 | Discharge: 2017-01-17 | Disposition: A | Discharge status: Home / Self Care | Payer: Medicare Other | Source: Ambulatory Visit | Attending: Internal Medicine | Admitting: Internal Medicine

## 2017-01-17 ENCOUNTER — Encounter: Payer: Self-pay | Admitting: Cardiology

## 2017-01-17 DIAGNOSIS — C7951 Secondary malignant neoplasm of bone: Secondary | ICD-10-CM | POA: Diagnosis not present

## 2017-01-17 DIAGNOSIS — I700 Atherosclerosis of aorta: Secondary | ICD-10-CM | POA: Insufficient documentation

## 2017-01-17 DIAGNOSIS — I2510 Atherosclerotic heart disease of native coronary artery without angina pectoris: Secondary | ICD-10-CM | POA: Diagnosis not present

## 2017-01-17 DIAGNOSIS — R590 Localized enlarged lymph nodes: Secondary | ICD-10-CM | POA: Insufficient documentation

## 2017-01-17 DIAGNOSIS — G893 Neoplasm related pain (acute) (chronic): Secondary | ICD-10-CM

## 2017-01-17 DIAGNOSIS — C3491 Malignant neoplasm of unspecified part of right bronchus or lung: Secondary | ICD-10-CM | POA: Insufficient documentation

## 2017-01-17 DIAGNOSIS — K861 Other chronic pancreatitis: Secondary | ICD-10-CM | POA: Insufficient documentation

## 2017-01-30 ENCOUNTER — Telehealth: Payer: Self-pay | Admitting: Internal Medicine

## 2017-01-30 ENCOUNTER — Other Ambulatory Visit (HOSPITAL_BASED_OUTPATIENT_CLINIC_OR_DEPARTMENT_OTHER): Payer: Medicare Other

## 2017-01-30 ENCOUNTER — Ambulatory Visit (HOSPITAL_BASED_OUTPATIENT_CLINIC_OR_DEPARTMENT_OTHER): Payer: Medicare Other

## 2017-01-30 ENCOUNTER — Ambulatory Visit (HOSPITAL_BASED_OUTPATIENT_CLINIC_OR_DEPARTMENT_OTHER): Payer: Medicare Other | Admitting: Internal Medicine

## 2017-01-30 ENCOUNTER — Encounter: Payer: Self-pay | Admitting: Internal Medicine

## 2017-01-30 ENCOUNTER — Encounter: Payer: Self-pay | Admitting: Gastroenterology

## 2017-01-30 VITALS — BP 127/87 | HR 86 | Temp 98.1°F | Resp 18 | Ht 69.0 in | Wt 191.1 lb

## 2017-01-30 DIAGNOSIS — C787 Secondary malignant neoplasm of liver and intrahepatic bile duct: Secondary | ICD-10-CM | POA: Diagnosis not present

## 2017-01-30 DIAGNOSIS — G893 Neoplasm related pain (acute) (chronic): Secondary | ICD-10-CM

## 2017-01-30 DIAGNOSIS — C3401 Malignant neoplasm of right main bronchus: Secondary | ICD-10-CM | POA: Diagnosis not present

## 2017-01-30 DIAGNOSIS — R5382 Chronic fatigue, unspecified: Secondary | ICD-10-CM

## 2017-01-30 DIAGNOSIS — G8929 Other chronic pain: Secondary | ICD-10-CM

## 2017-01-30 DIAGNOSIS — C7951 Secondary malignant neoplasm of bone: Secondary | ICD-10-CM

## 2017-01-30 DIAGNOSIS — C7889 Secondary malignant neoplasm of other digestive organs: Secondary | ICD-10-CM | POA: Diagnosis not present

## 2017-01-30 DIAGNOSIS — C3491 Malignant neoplasm of unspecified part of right bronchus or lung: Secondary | ICD-10-CM

## 2017-01-30 DIAGNOSIS — Z5112 Encounter for antineoplastic immunotherapy: Secondary | ICD-10-CM

## 2017-01-30 DIAGNOSIS — Z8546 Personal history of malignant neoplasm of prostate: Secondary | ICD-10-CM

## 2017-01-30 DIAGNOSIS — C801 Malignant (primary) neoplasm, unspecified: Principal | ICD-10-CM

## 2017-01-30 LAB — COMPREHENSIVE METABOLIC PANEL
ALK PHOS: 126 U/L (ref 40–150)
ALT: 18 U/L (ref 0–55)
AST: 15 U/L (ref 5–34)
Albumin: 2.9 g/dL — ABNORMAL LOW (ref 3.5–5.0)
Anion Gap: 10 mEq/L (ref 3–11)
BILIRUBIN TOTAL: 0.3 mg/dL (ref 0.20–1.20)
BUN: 65.8 mg/dL — AB (ref 7.0–26.0)
CO2: 24 meq/L (ref 22–29)
CREATININE: 2.2 mg/dL — AB (ref 0.7–1.3)
Calcium: 9.4 mg/dL (ref 8.4–10.4)
Chloride: 104 mEq/L (ref 98–109)
EGFR: 36 mL/min/{1.73_m2} — AB (ref >90)
GLUCOSE: 76 mg/dL (ref 70–140)
Potassium: 4.7 mEq/L (ref 3.5–5.1)
SODIUM: 138 meq/L (ref 136–145)
TOTAL PROTEIN: 6.6 g/dL (ref 6.4–8.3)

## 2017-01-30 LAB — CBC WITH DIFFERENTIAL/PLATELET
BASO%: 0.5 % (ref 0.0–2.0)
Basophils Absolute: 0.1 10*3/uL (ref 0.0–0.1)
EOS ABS: 0.1 10*3/uL (ref 0.0–0.5)
EOS%: 0.7 % (ref 0.0–7.0)
HCT: 34.6 % — ABNORMAL LOW (ref 38.4–49.9)
HEMOGLOBIN: 11.3 g/dL — AB (ref 13.0–17.1)
LYMPH%: 12.8 % — ABNORMAL LOW (ref 14.0–49.0)
MCH: 31.5 pg (ref 27.2–33.4)
MCHC: 32.7 g/dL (ref 32.0–36.0)
MCV: 96.3 fL (ref 79.3–98.0)
MONO#: 1.4 10*3/uL — AB (ref 0.1–0.9)
MONO%: 9.3 % (ref 0.0–14.0)
NEUT%: 76.7 % — ABNORMAL HIGH (ref 39.0–75.0)
NEUTROS ABS: 11.3 10*3/uL — AB (ref 1.5–6.5)
Platelets: 273 10*3/uL (ref 140–400)
RBC: 3.59 10*6/uL — ABNORMAL LOW (ref 4.20–5.82)
RDW: 23.2 % — AB (ref 11.0–14.6)
WBC: 14.8 10*3/uL — AB (ref 4.0–10.3)
lymph#: 1.9 10*3/uL (ref 0.9–3.3)

## 2017-01-30 MED ORDER — HEPARIN SOD (PORK) LOCK FLUSH 100 UNIT/ML IV SOLN
500.0000 [IU] | Freq: Once | INTRAVENOUS | Status: AC | PRN
  Administered 2017-01-30: 500 [IU]
  Filled 2017-01-30: qty 5

## 2017-01-30 MED ORDER — SODIUM CHLORIDE 0.9 % IV SOLN
Freq: Once | INTRAVENOUS | Status: AC
  Administered 2017-01-30: 10:00:00 via INTRAVENOUS

## 2017-01-30 MED ORDER — OXYCODONE-ACETAMINOPHEN 5-325 MG PO TABS: 1.0000 | ORAL_TABLET | ORAL | 0 refills | Status: DC | PRN

## 2017-01-30 MED ORDER — SODIUM CHLORIDE 0.9 % IJ SOLN
10.0000 mL | INTRAMUSCULAR | Status: DC | PRN
  Administered 2017-01-30: 10 mL
  Filled 2017-01-30: qty 10

## 2017-01-30 MED ORDER — SODIUM CHLORIDE 0.9 % IV SOLN
240.0000 mg | Freq: Once | INTRAVENOUS | Status: AC
  Administered 2017-01-30: 240 mg via INTRAVENOUS
  Filled 2017-01-30: qty 24

## 2017-01-30 NOTE — Progress Notes (Signed)
OK to treat with creatinine level today per MD [facility]

## 2017-01-30 NOTE — Patient Instructions (Signed)
[facility]cer Center Discharge Instructions for Patients Receiving Chemotherapy  Today you received the following chemotherapy agents:  Opdivo  To help prevent nausea and vomiting after your treatment, we encourage you to take your nausea medication as prescribed.   If you develop nausea and vomiting that is not controlled by your nausea medication, call the clinic.   BELOW ARE SYMPTOMS THAT SHOULD BE REPORTED IMMEDIATELY:  *FEVER GREATER THAN 100.5 F  *CHILLS WITH OR WITHOUT FEVER  NAUSEA AND VOMITING THAT IS NOT CONTROLLED WITH YOUR NAUSEA MEDICATION  *UNUSUAL SHORTNESS OF BREATH  *UNUSUAL BRUISING OR BLEEDING  TENDERNESS IN MOUTH AND THROAT WITH OR WITHOUT PRESENCE OF ULCERS  *URINARY PROBLEMS  *BOWEL PROBLEMS  UNUSUAL RASH Items with * indicate a potential emergency and should be followed up as soon as possible.  Feel free to call the clinic you have any questions or concerns. The clinic phone number is [phone].  Please show the CHEMO ALERT CARD at check-in to the Emergency Department and triage nurse.   

## 2017-01-30 NOTE — Progress Notes (Signed)
[place] [phone]   [phone]  OFFICE PROGRESS NOTE  Sherlynn Stalls., MD [place] [address]  DIAGNOSIS:  1) Stage IV (T2b, N2, M1b) non-small cell lung cancer, adenocarcinoma with negative EGFR mutation and negative gene translocation presented with a large right hilar mass as well as mediastinal lymphadenopathy and metastatic disease to the liver, bone and pancreas diagnosed in February 2016. 2) prostate adenocarcinoma diagnosed at [facility] with Gleason score 9 (4+5).  PRIOR THERAPY:  1) Systemic chemotherapy with carboplatin for AUC of 5 and Alimta 500 MG/M2 every 3 weeks. Status post 6 cycles. 2) Enzalutamide for prostate cancer at [facility]:  Immunotherapy with Nivolumab 240 MG every 2 weeks, status post 44 cycles.  INTERVAL HISTORY: Francisco Love 56 y.o. male returns to the clinic today for follow-up visit accompanied by his brother-in-law. The patient is feeling fine today with no specific complaints except for the chronic fatigue and back pain. He is currently on Percocet and requesting refill of his medication. He denied having any chest pain, shortness of breath except with exertion and no cough or hemoptysis. He gained around 9 pounds since his last visit. He is drinking a lot of  Ensure. He has no fever or chills. He denied having any nausea, vomiting, diarrhea or constipation. He had CT scan of the chest, abdomen and pelvis performed recently and he is here for evaluation and discussion of his scan results and treatment options.  MEDICAL HISTORY: Past Medical History:  Diagnosis Date  . Chronic fatigue 04/12/2016  . Chronic pain 04/12/2016  . Chronic renal disease, stage III   . Metastatic cancer ([place])     ALLERGIES:  has No Known Allergies.  MEDICATIONS:  Current Outpatient Prescriptions  Medication Sig Dispense Refill  . acetaminophen (TYLENOL) 325 MG tablet Take  2 tablets (650 mg total) by mouth every 6 (six) hours as needed for mild pain (or Fever >/= 101).    Marland Kitchen albuterol (PROVENTIL) (2.5 MG/3ML) 0.083% nebulizer solution Take 3 mLs (2.5 mg total) by nebulization every 2 (two) hours as needed for wheezing. 75 mL 12  . AMITIZA 24 MCG capsule Take 24 mcg by mouth 2 (two) times daily with a meal.     . bicalutamide (CASODEX) 50 MG tablet Take 50 mg by mouth daily.     . diphenhydrAMINE (BENADRYL) 25 MG tablet Take 25 mg by mouth at bedtime as needed for itching.     . feeding supplement, ENSURE ENLIVE, (ENSURE ENLIVE) LIQD Take 237 mLs by mouth 2 (two) times daily between meals. 017 mL 12  . folic acid (FOLVITE) 1 MG tablet Take 1 mg by mouth daily.    . furosemide (LASIX) 20 MG tablet Take 1 tablet (20 mg total) by mouth daily as needed for fluid or edema. 30 tablet 0  . levocetirizine (XYZAL) 5 MG tablet Take 5 mg by mouth.    . lidocaine-prilocaine (EMLA) cream Apply 1 application topically as needed. Apply to port site prior to chemotherapy. 30 g 0  . magnesium oxide (MAG-OX) 400 MG tablet Take 400 mg by mouth daily.    . metoprolol tartrate (LOPRESSOR) 25 MG tablet Take 0.5 tablets (12.5 mg total) by mouth 2 (two) times daily. 60 tablet 0  . mirtazapine (REMERON) 15 MG tablet Take 1 tablet (15 mg total) by mouth at bedtime.    Marland Kitchen oxyCODONE-acetaminophen (PERCOCET/ROXICET) 5-325 MG tablet Take  1 tablet by mouth every 4 (four) hours as needed for severe pain. 60 tablet 0  . polyethylene glycol (MIRALAX / GLYCOLAX) packet Take 17 g by mouth daily as needed for mild constipation.     . prochlorperazine (COMPAZINE) 10 MG tablet Take 1 tablet (10 mg total) by mouth every 6 (six) hours as needed for nausea or vomiting. 30 tablet 1  . senna-docusate (SENOKOT-S) 8.6-50 MG per tablet Take 1 tablet by mouth daily.    . sodium bicarbonate 650 MG tablet Take 1 tablet (650 mg total) by mouth daily. 30 tablet 0  . SUMAtriptan (IMITREX) 25 MG tablet Take 25 mg by mouth  every 2 (two) hours as needed for migraine.     . tamsulosin (FLOMAX) 0.4 MG CAPS capsule Take 0.4 mg by mouth daily.     . valACYclovir (VALTREX) 1000 MG tablet Take 1,000 mg by mouth 2 (two) times daily.    [name] 40 MG capsule Take 160 mg by mouth daily.      No current facility-administered medications for this visit.     SURGICAL HISTORY:  Past Surgical History:  Procedure Laterality Date  . APPENDECTOMY    . VIDEO BRONCHOSCOPY N/A 09/21/2014   Procedure: VIDEO BRONCHOSCOPY WITH FLUORO;  Surgeon: Rigoberto Noel, MD;  Location: [facility];  Service: Cardiopulmonary;  Laterality: N/A;  . VIDEO BRONCHOSCOPY WITH ENDOBRONCHIAL ULTRASOUND N/A 09/23/2014   Procedure: VIDEO BRONCHOSCOPY WITH ENDOBRONCHIAL ULTRASOUND;  Surgeon: Rigoberto Noel, MD;  Location: [facility];  Service: Pulmonary;  Laterality: N/A;    REVIEW OF SYSTEMS:  Constitutional: positive for fatigue Eyes: negative Ears, nose, mouth, throat, and face: negative Respiratory: positive for dyspnea on exertion Cardiovascular: negative Gastrointestinal: negative Genitourinary:negative Integument/breast: negative Hematologic/lymphatic: negative Musculoskeletal:positive for back pain Neurological: negative Behavioral/Psych: negative Endocrine: negative Allergic/Immunologic: negative   PHYSICAL EXAMINATION: General appearance: alert, cooperative, fatigued and no distress Head: Normocephalic, without obvious abnormality, atraumatic Neck: no adenopathy, no JVD, supple, symmetrical, trachea midline and thyroid not enlarged, symmetric, no tenderness/mass/nodules Lymph nodes: Cervical, supraclavicular, and axillary nodes normal. Resp: clear to auscultation bilaterally Back: symmetric, no curvature. ROM normal. No CVA tenderness. Cardio: regular rate and rhythm, S1, S2 normal, no murmur, click, rub or gallop GI: soft, non-tender; bowel sounds normal; no masses,  no organomegaly Extremities: extremities normal, atraumatic, no cyanosis  or edema Neurologic: Alert and oriented X 3, normal strength and tone. Normal symmetric reflexes. Normal coordination and gait   ECOG PERFORMANCE STATUS: 1 - Symptomatic but completely ambulatory  Blood pressure 127/87, pulse 86, temperature 98.1 F (36.7 C), temperature source Oral, resp. rate 18, height _0  (1.753 m), weight 191 lb 1.6 oz (86.7 kg), SpO2 99 %.  LABORATORY DATA: Lab Results  Component Value Date   WBC 15.1 (H) 01/16/2017   HGB 11.3 (L) 01/16/2017   HCT 34.0 (L) 01/16/2017   MCV 94.3 01/16/2017   PLT 338 01/16/2017      Chemistry      Component Value Date/Time   NA 138 01/16/2017 0805   K 4.9 01/16/2017 0805   CL 104 12/24/2016 0849   CO2 21 (L) 01/16/2017 0805   BUN 72.1 (H) 01/16/2017 0805   CREATININE 2.1 (H) 01/16/2017 0805      Component Value Date/Time   CALCIUM 9.2 01/16/2017 0805   ALKPHOS 119 01/16/2017 0805   AST 11 01/16/2017 0805   ALT 15 01/16/2017 0805   BILITOT 0.23 01/16/2017 0805       RADIOGRAPHIC STUDIES: Ct Abdomen  Pelvis Wo Contrast  Result Date: 01/17/2017 CLINICAL DATA:  Non-small-cell lung cancer with bone metastasis. Liver metastasis. Pancreas metastasis. Ongoing chemotherapy. Prostate cancer with hormone therapy. EXAM: CT CHEST, ABDOMEN AND PELVIS WITHOUT CONTRAST TECHNIQUE: Multidetector CT imaging of the chest, abdomen and pelvis was performed following the standard protocol without IV contrast. COMPARISON:  Chest radiograph 12/24/2016.  CTs of 10/24/2016. FINDINGS: CT CHEST FINDINGS Cardiovascular: A right-sided Port-A-Cath which terminates at the high right atrium. Aortic atherosclerosis. Tortuous thoracic aorta. Mild cardiomegaly with LAD coronary artery atherosclerosis. Mediastinum/Nodes: No supraclavicular adenopathy. Partially calcified right paratracheal nodal mass measures 5.4 x 2.8 cm on image 23/series 2. Compare 5.0 x 3.2 cm on the prior exam Adenopathy within the azygoesophageal recess measures 1.7 cm on image  35/series 2 versus 1.6 cm on the prior. Calcified right hilar nodes are grossly similar, but suboptimally evaluated on this noncontrast exam. Lungs/Pleura: Similar mild bilateral pleural thickening. Mild centrilobular and paraseptal emphysema. 7 mm right lower lobe pulmonary nodule on image 89/series 6 is unchanged. A left upper lobe 6 mm nodule is similar on image 75/series 6. Superior segment left lower lobe 6 mm nodule on image 68/series 6 is unchanged. Musculoskeletal: Similar widespread osseous metastasis. CT ABDOMEN PELVIS FINDINGS Hepatobiliary: Normal liver. Normal gallbladder, without biliary ductal dilatation. Pancreas: Chronic calcific pancreatitis again identified. Pancreatic duct mildly to moderately dilated at 7 mm in the head on image 68/series 2. Compare 4 mm on the prior exam. Partially calcified cystic area within the pancreatic head/ uncinate process is similar at on the order of 1.8 cm on image 73/series 2. No evidence of acute pancreatitis. Spleen: Normal in size, without focal abnormality. Adrenals/Urinary Tract: Normal adrenal glands. Left renal atrophy. No hydronephrosis. Normal urinary bladder. Stomach/Bowel: Normal stomach, without wall thickening. Colonic stool burden suggests constipation. Normal terminal ileum. Normal small bowel. Vascular/Lymphatic: Aortic and branch vessel atherosclerosis. No abdominopelvic adenopathy. Reproductive: Normal prostate. Other: No significant free fluid. Fat containing left inguinal hernia. Musculoskeletal: Extensive sclerotic osseous metastasis. 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 IMPRESSION: CT CHEST IMPRESSION 1. Similar thoracic adenopathy. 2. Similar bilateral pulmonary nodules. 3. Similar osseous metastasis. 4.  Coronary artery atherosclerosis. Aortic atherosclerosis. CT ABDOMEN AND PELVIS IMPRESSION 1. No soft tissue metastasis identified within the abdomen or pelvis. 2. Similar osseous metastasis. 3.  Chronic calcific pancreatitis. Pancreatic duct dilatation Is felt to be increased since the prior. Question developing stricture. This could either be re-evaluated at follow-up, or if patient has abdominal symptoms, further characterized with MRI/ MRCP. 4. Possible constipation. Electronically Signed   By: Abigail Miyamoto M.D.   On: 01/17/2017 10:58   Ct Chest Wo Contrast  Result Date: 01/17/2017 CLINICAL DATA:  Non-small-cell lung cancer with bone metastasis. Liver metastasis. Pancreas metastasis. Ongoing chemotherapy. Prostate cancer with hormone therapy. EXAM: CT CHEST, ABDOMEN AND PELVIS WITHOUT CONTRAST TECHNIQUE: Multidetector CT imaging of the chest, abdomen and pelvis was performed following the standard protocol without IV contrast. COMPARISON:  Chest radiograph 12/24/2016.  CTs of 10/24/2016. FINDINGS: CT CHEST FINDINGS Cardiovascular: A right-sided Port-A-Cath which terminates at the high right atrium. Aortic atherosclerosis. Tortuous thoracic aorta. Mild cardiomegaly with LAD coronary artery atherosclerosis. Mediastinum/Nodes: No supraclavicular adenopathy. Partially calcified right paratracheal nodal mass measures 5.4 x 2.8 cm on image 23/series 2. Compare 5.0 x 3.2 cm on the prior exam Adenopathy within the azygoesophageal recess measures 1.7 cm on image 35/series 2 versus 1.6 cm on the prior. Calcified right hilar nodes are grossly similar, but suboptimally evaluated on this  noncontrast exam. Lungs/Pleura: Similar mild bilateral pleural thickening. Mild centrilobular and paraseptal emphysema. 7 mm right lower lobe pulmonary nodule on image 89/series 6 is unchanged. A left upper lobe 6 mm nodule is similar on image 75/series 6. Superior segment left lower lobe 6 mm nodule on image 68/series 6 is unchanged. Musculoskeletal: Similar widespread osseous metastasis. CT ABDOMEN PELVIS FINDINGS Hepatobiliary: Normal liver. Normal gallbladder, without biliary ductal dilatation. Pancreas: Chronic calcific  pancreatitis again identified. Pancreatic duct mildly to moderately dilated at 7 mm in the head on image 68/series 2. Compare 4 mm on the prior exam. Partially calcified cystic area within the pancreatic head/ uncinate process is similar at on the order of 1.8 cm on image 73/series 2. No evidence of acute pancreatitis. Spleen: Normal in size, without focal abnormality. Adrenals/Urinary Tract: Normal adrenal glands. Left renal atrophy. No hydronephrosis. Normal urinary bladder. Stomach/Bowel: Normal stomach, without wall thickening. Colonic stool burden suggests constipation. Normal terminal ileum. Normal small bowel. Vascular/Lymphatic: Aortic and branch vessel atherosclerosis. No abdominopelvic adenopathy. Reproductive: Normal prostate. Other: No significant free fluid. Fat containing left inguinal hernia. Musculoskeletal: Extensive sclerotic osseous metastasis. 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 IMPRESSION: CT CHEST IMPRESSION 1. Similar thoracic adenopathy. 2. Similar bilateral pulmonary nodules. 3. Similar osseous metastasis. 4.  Coronary artery atherosclerosis. Aortic atherosclerosis. CT ABDOMEN AND PELVIS IMPRESSION 1. No soft tissue metastasis identified within the abdomen or pelvis. 2. Similar osseous metastasis. 3. Chronic calcific pancreatitis. Pancreatic duct dilatation Is felt to be increased since the prior. Question developing stricture. This could either be re-evaluated at follow-up, or if patient has abdominal symptoms, further characterized with MRI/ MRCP. 4. Possible constipation. Electronically Signed   By: Abigail Miyamoto M.D.   On: 01/17/2017 10:58   ASSESSMENT AND PLAN:  This is a very pleasant 56 years old African-American male with metastatic non-small cell lung cancer, adenocarcinoma status post induction systemic chemotherapy with carboplatin and Alimta. He is currently on second line treatment with single agent Nivolumab 240 mg IV every 2 weeks  status post 44 cycles. He has been tolerating this treatment well with no significant adverse effects. He had a recent CT scan of the chest, abdomen and pelvis. I personally and independently reviewed the scans and discussed the results with the patient and his brother-in-law. His CT scan showed stable disease but the new findings there is a presence of pancreatic ductal dilatation. I recommended for the patient to continue his treatment with Nivolumab and he will proceed with cycle #45 today as a scheduled. For the pancreatic ductal dilatation, I will defer the patient to gastroenterology for evaluation. For the history of prostate adenocarcinoma, he is currently followed by oncology at [facility] and he discontinue the treatment with Enzalutamide. He is considering her for treatment with Radium 223 in early July. The patient would come back for follow-up visit in 2 weeks for reevaluation with the next cycle of his treatment. For the chronic pain, I gave him a refill of Percocet. He was advised to call immediately if he has any concerning symptoms in the interval. The patient voices understanding of current disease status and treatment options and is in agreement with the current care plan. All questions were answered. The patient knows to call the clinic with any problems, questions or concerns. We can certainly see the patient much sooner if necessary.  Disclaimer: This note was dictated with voice recognition software. Similar sounding words can inadvertently be transcribed and may not be corrected upon  review.

## 2017-01-30 NOTE — Telephone Encounter (Signed)
No additional appts need per 6/13 los. ( 3 cycles already scheduled) [facility]- per referral aptp already scheduled.

## 2017-02-13 ENCOUNTER — Telehealth: Payer: Self-pay | Admitting: Internal Medicine

## 2017-02-13 ENCOUNTER — Ambulatory Visit (HOSPITAL_BASED_OUTPATIENT_CLINIC_OR_DEPARTMENT_OTHER): Payer: Medicare Other | Admitting: Internal Medicine

## 2017-02-13 ENCOUNTER — Encounter: Payer: Self-pay | Admitting: Internal Medicine

## 2017-02-13 ENCOUNTER — Other Ambulatory Visit (HOSPITAL_BASED_OUTPATIENT_CLINIC_OR_DEPARTMENT_OTHER): Payer: Medicare Other

## 2017-02-13 ENCOUNTER — Ambulatory Visit (HOSPITAL_BASED_OUTPATIENT_CLINIC_OR_DEPARTMENT_OTHER): Payer: Medicare Other

## 2017-02-13 DIAGNOSIS — C61 Malignant neoplasm of prostate: Secondary | ICD-10-CM | POA: Diagnosis not present

## 2017-02-13 DIAGNOSIS — Z79899 Other long term (current) drug therapy: Secondary | ICD-10-CM

## 2017-02-13 DIAGNOSIS — G893 Neoplasm related pain (acute) (chronic): Secondary | ICD-10-CM

## 2017-02-13 DIAGNOSIS — C3401 Malignant neoplasm of right main bronchus: Secondary | ICD-10-CM

## 2017-02-13 DIAGNOSIS — C7951 Secondary malignant neoplasm of bone: Secondary | ICD-10-CM

## 2017-02-13 DIAGNOSIS — Z5112 Encounter for antineoplastic immunotherapy: Secondary | ICD-10-CM

## 2017-02-13 DIAGNOSIS — C3491 Malignant neoplasm of unspecified part of right bronchus or lung: Secondary | ICD-10-CM

## 2017-02-13 DIAGNOSIS — C7889 Secondary malignant neoplasm of other digestive organs: Secondary | ICD-10-CM | POA: Diagnosis not present

## 2017-02-13 DIAGNOSIS — C787 Secondary malignant neoplasm of liver and intrahepatic bile duct: Secondary | ICD-10-CM

## 2017-02-13 DIAGNOSIS — G8929 Other chronic pain: Secondary | ICD-10-CM

## 2017-02-13 DIAGNOSIS — R5382 Chronic fatigue, unspecified: Secondary | ICD-10-CM

## 2017-02-13 DIAGNOSIS — C801 Malignant (primary) neoplasm, unspecified: Principal | ICD-10-CM

## 2017-02-13 LAB — CBC WITH DIFFERENTIAL/PLATELET
BASO%: 0.3 % (ref 0.0–2.0)
BASOS ABS: 0 10*3/uL (ref 0.0–0.1)
EOS%: 0.7 % (ref 0.0–7.0)
Eosinophils Absolute: 0.1 10*3/uL (ref 0.0–0.5)
HCT: 37.7 % — ABNORMAL LOW (ref 38.4–49.9)
HEMOGLOBIN: 12.5 g/dL — AB (ref 13.0–17.1)
LYMPH%: 15.4 % (ref 14.0–49.0)
MCH: 32.5 pg (ref 27.2–33.4)
MCHC: 33.2 g/dL (ref 32.0–36.0)
MCV: 97.8 fL (ref 79.3–98.0)
MONO#: 1.1 10*3/uL — ABNORMAL HIGH (ref 0.1–0.9)
MONO%: 8.1 % (ref 0.0–14.0)
NEUT#: 10.3 10*3/uL — ABNORMAL HIGH (ref 1.5–6.5)
NEUT%: 75.5 % — ABNORMAL HIGH (ref 39.0–75.0)
Platelets: 290 10*3/uL (ref 140–400)
RBC: 3.85 10*6/uL — ABNORMAL LOW (ref 4.20–5.82)
RDW: 23.8 % — AB (ref 11.0–14.6)
WBC: 13.6 10*3/uL — ABNORMAL HIGH (ref 4.0–10.3)
lymph#: 2.1 10*3/uL (ref 0.9–3.3)

## 2017-02-13 LAB — COMPREHENSIVE METABOLIC PANEL
ALBUMIN: 2.8 g/dL — AB (ref 3.5–5.0)
ALT: 19 U/L (ref 0–55)
AST: 14 U/L (ref 5–34)
Alkaline Phosphatase: 144 U/L (ref 40–150)
Anion Gap: 15 mEq/L — ABNORMAL HIGH (ref 3–11)
BUN: 37.8 mg/dL — AB (ref 7.0–26.0)
CHLORIDE: 107 meq/L (ref 98–109)
CO2: 20 mEq/L — ABNORMAL LOW (ref 22–29)
Calcium: 9.4 mg/dL (ref 8.4–10.4)
Creatinine: 1.9 mg/dL — ABNORMAL HIGH (ref 0.7–1.3)
EGFR: 44 mL/min/{1.73_m2} — ABNORMAL LOW (ref >90)
GLUCOSE: 110 mg/dL (ref 70–140)
POTASSIUM: 4.1 meq/L (ref 3.5–5.1)
SODIUM: 142 meq/L (ref 136–145)
Total Bilirubin: 0.28 mg/dL (ref 0.20–1.20)
Total Protein: 6.7 g/dL (ref 6.4–8.3)

## 2017-02-13 LAB — TSH: TSH: 1.845 m(IU)/L (ref 0.320–4.118)

## 2017-02-13 MED ORDER — OXYCODONE-ACETAMINOPHEN 5-325 MG PO TABS: 1.0000 | ORAL_TABLET | ORAL | 0 refills | Status: DC | PRN

## 2017-02-13 MED ORDER — SODIUM CHLORIDE 0.9 % IJ SOLN
10.0000 mL | INTRAMUSCULAR | Status: DC | PRN
  Administered 2017-02-13: 10 mL
  Filled 2017-02-13: qty 10

## 2017-02-13 MED ORDER — HEPARIN SOD (PORK) LOCK FLUSH 100 UNIT/ML IV SOLN
500.0000 [IU] | Freq: Once | INTRAVENOUS | Status: AC | PRN
Start: 2017-02-13 — End: 2017-02-13
  Administered 2017-02-13: 500 [IU]
  Filled 2017-02-13: qty 5

## 2017-02-13 MED ORDER — NIVOLUMAB CHEMO INJECTION 100 MG/10ML
240.0000 mg | Freq: Once | INTRAVENOUS | Status: AC
  Administered 2017-02-13: 240 mg via INTRAVENOUS
  Filled 2017-02-13: qty 24

## 2017-02-13 MED ORDER — SODIUM CHLORIDE 0.9 % IV SOLN
Freq: Once | INTRAVENOUS | Status: AC
  Administered 2017-02-13: 09:00:00 via INTRAVENOUS

## 2017-02-13 NOTE — Telephone Encounter (Signed)
Appointments scheduled and confirmed with patient, per 02/13/17 los. Patient was given a copy of the AVS report and appointment scheduled per 02/13/17 los.

## 2017-02-13 NOTE — Patient Instructions (Signed)
[facility] Discharge Instructions for Patients Receiving Chemotherapy  Today you received the following chemotherapy agents:  Nivolumab.  To help prevent nausea and vomiting after your treatment, we encourage you to take your nausea medication as directed.   If you develop nausea and vomiting that is not controlled by your nausea medication, call the clinic.   BELOW ARE SYMPTOMS THAT SHOULD BE REPORTED IMMEDIATELY:  *FEVER GREATER THAN 100.5 F  *CHILLS WITH OR WITHOUT FEVER  NAUSEA AND VOMITING THAT IS NOT CONTROLLED WITH YOUR NAUSEA MEDICATION  *UNUSUAL SHORTNESS OF BREATH  *UNUSUAL BRUISING OR BLEEDING  TENDERNESS IN MOUTH AND THROAT WITH OR WITHOUT PRESENCE OF ULCERS  *URINARY PROBLEMS  *BOWEL PROBLEMS  UNUSUAL RASH Items with * indicate a potential emergency and should be followed up as soon as possible.  Feel free to call the clinic you have any questions or concerns. The clinic phone number is [phone].  Please show the CHEMO ALERT CARD at check-in to the Emergency Department and triage nurse.   

## 2017-02-13 NOTE — Progress Notes (Signed)
[place] [phone]   [phone]  OFFICE PROGRESS NOTE  Sherlynn Stalls., MD [place] [address]  DIAGNOSIS:  1) Stage IV (T2b, N2, M1b) non-small cell lung cancer, adenocarcinoma with negative EGFR mutation and negative gene translocation presented with a large right hilar mass as well as mediastinal lymphadenopathy and metastatic disease to the liver, bone and pancreas diagnosed in February 2016. 2) prostate adenocarcinoma diagnosed at [facility] with Gleason score 9 (4+5).  PRIOR THERAPY:  1) Systemic chemotherapy with carboplatin for AUC of 5 and Alimta 500 MG/M2 every 3 weeks. Status post 6 cycles. 2) Enzalutamide for prostate cancer at [place]:  Immunotherapy with Nivolumab 240 MG every 2 weeks, status post 45 cycles.  INTERVAL HISTORY: Francisco Love 56 y.o. male returns to the clinic today for follow-up visit accompanied by his brother-in-law. The patient is feeling fine today was no specific complaints except for fatigue and shortness breath with exertion. He continues on home oxygen. He denied having any chest pain, cough or hemoptysis. He denied having any recent weight loss or night sweats. He has no nausea, vomiting, diarrhea or constipation. He continues to tolerate his treatment with Nivolumab fairly well. He is scheduled for treatment with the radium 223 at [facility] for the prostate cancer on 02/21/2017. The patient is here today for evaluation before starting cycle #46 of his treatment with Nivolumab.   MEDICAL HISTORY: Past Medical History:  Diagnosis Date  . Chronic fatigue 04/12/2016  . Chronic pain 04/12/2016  . Chronic renal disease, stage III   . Metastatic cancer ([place])     ALLERGIES:  has No Known Allergies.  MEDICATIONS:  Current Outpatient Prescriptions  Medication Sig Dispense Refill  . acetaminophen (TYLENOL) 325 MG tablet Take 2 tablets (650 mg  total) by mouth every 6 (six) hours as needed for mild pain (or Fever >/= 101).    Marland Kitchen albuterol (PROVENTIL) (2.5 MG/3ML) 0.083% nebulizer solution Take 3 mLs (2.5 mg total) by nebulization every 2 (two) hours as needed for wheezing. 75 mL 12  . AMITIZA 24 MCG capsule Take 24 mcg by mouth 2 (two) times daily with a meal.     . bicalutamide (CASODEX) 50 MG tablet Take 50 mg by mouth daily.     . diphenhydrAMINE (BENADRYL) 25 MG tablet Take 25 mg by mouth at bedtime as needed for itching.     . feeding supplement, ENSURE ENLIVE, (ENSURE ENLIVE) LIQD Take 237 mLs by mouth 2 (two) times daily between meals. 734 mL 12  . folic acid (FOLVITE) 1 MG tablet Take 1 mg by mouth daily.    . furosemide (LASIX) 20 MG tablet Take 1 tablet (20 mg total) by mouth daily as needed for fluid or edema. 30 tablet 0  . levocetirizine (XYZAL) 5 MG tablet Take 5 mg by mouth.    . lidocaine-prilocaine (EMLA) cream Apply 1 application topically as needed. Apply to port site prior to chemotherapy. 30 g 0  . magnesium oxide (MAG-OX) 400 MG tablet Take 400 mg by mouth daily.    . metoprolol tartrate (LOPRESSOR) 25 MG tablet Take 0.5 tablets (12.5 mg total) by mouth 2 (two) times daily. 60 tablet 0  . mirtazapine (REMERON) 15 MG tablet Take 1 tablet (15 mg total) by mouth at bedtime.    Marland Kitchen oxyCODONE-acetaminophen (PERCOCET/ROXICET) 5-325 MG tablet Take 1 tablet by mouth every 4 (four) hours as  needed for severe pain. 60 tablet 0  . polyethylene glycol (MIRALAX / GLYCOLAX) packet Take 17 g by mouth daily as needed for mild constipation.     . prochlorperazine (COMPAZINE) 10 MG tablet Take 1 tablet (10 mg total) by mouth every 6 (six) hours as needed for nausea or vomiting. 30 tablet 1  . senna-docusate (SENOKOT-S) 8.6-50 MG per tablet Take 1 tablet by mouth daily.    . sodium bicarbonate 650 MG tablet Take 1 tablet (650 mg total) by mouth daily. 30 tablet 0  . SUMAtriptan (IMITREX) 25 MG tablet Take 25 mg by mouth every 2 (two)  hours as needed for migraine.     . tamsulosin (FLOMAX) 0.4 MG CAPS capsule Take 0.4 mg by mouth daily.     . valACYclovir (VALTREX) 1000 MG tablet Take 1,000 mg by mouth 2 (two) times daily.    [name] 40 MG capsule Take 160 mg by mouth daily.      No current facility-administered medications for this visit.     SURGICAL HISTORY:  Past Surgical History:  Procedure Laterality Date  . APPENDECTOMY    . VIDEO BRONCHOSCOPY N/A 09/21/2014   Procedure: VIDEO BRONCHOSCOPY WITH FLUORO;  Surgeon: Rigoberto Noel, MD;  Location: [place];  Service: Cardiopulmonary;  Laterality: N/A;  . VIDEO BRONCHOSCOPY WITH ENDOBRONCHIAL ULTRASOUND N/A 09/23/2014   Procedure: VIDEO BRONCHOSCOPY WITH ENDOBRONCHIAL ULTRASOUND;  Surgeon: Rigoberto Noel, MD;  Location: [place];  Service: Pulmonary;  Laterality: N/A;    REVIEW OF SYSTEMS:  A comprehensive review of systems was negative except for: Constitutional: positive for fatigue Respiratory: positive for dyspnea on exertion Musculoskeletal: positive for muscle weakness   PHYSICAL EXAMINATION: General appearance: alert, cooperative, fatigued and no distress Head: Normocephalic, without obvious abnormality, atraumatic Neck: no adenopathy, no JVD, supple, symmetrical, trachea midline and thyroid not enlarged, symmetric, no tenderness/mass/nodules Lymph nodes: Cervical, supraclavicular, and axillary nodes normal. Resp: clear to auscultation bilaterally Back: symmetric, no curvature. ROM normal. No CVA tenderness. Cardio: regular rate and rhythm, S1, S2 normal, no murmur, click, rub or gallop GI: soft, non-tender; bowel sounds normal; no masses,  no organomegaly Extremities: extremities normal, atraumatic, no cyanosis or edema   ECOG PERFORMANCE STATUS: 1 - Symptomatic but completely ambulatory  Blood pressure (!) 148/96, pulse 96, temperature 97.4 F (36.3 C), temperature source Oral, resp. rate 17, height 5' 9"  (1.753 m), weight 189 lb 8 oz (86 kg), SpO2 97  %.  LABORATORY DATA: Lab Results  Component Value Date   WBC 13.6 (H) 02/13/2017   HGB 12.5 (L) 02/13/2017   HCT 37.7 (L) 02/13/2017   MCV 97.8 02/13/2017   PLT 290 02/13/2017      Chemistry      Component Value Date/Time   NA 138 01/30/2017 0821   K 4.7 01/30/2017 0821   CL 104 12/24/2016 0849   CO2 24 01/30/2017 0821   BUN 65.8 (H) 01/30/2017 0821   CREATININE 2.2 (H) 01/30/2017 0821      Component Value Date/Time   CALCIUM 9.4 01/30/2017 0821   ALKPHOS 126 01/30/2017 0821   AST 15 01/30/2017 0821   ALT 18 01/30/2017 0821   BILITOT 0.30 01/30/2017 0821       RADIOGRAPHIC STUDIES: Ct Abdomen Pelvis Wo Contrast  Result Date: 01/17/2017 CLINICAL DATA:  Non-small-cell lung cancer with bone metastasis. Liver metastasis. Pancreas metastasis. Ongoing chemotherapy. Prostate cancer with hormone therapy. EXAM: CT CHEST, ABDOMEN AND PELVIS WITHOUT CONTRAST TECHNIQUE: Multidetector CT imaging of the chest,  abdomen and pelvis was performed following the standard protocol without IV contrast. COMPARISON:  Chest radiograph 12/24/2016.  CTs of 10/24/2016. FINDINGS: CT CHEST FINDINGS Cardiovascular: A right-sided Port-A-Cath which terminates at the high right atrium. Aortic atherosclerosis. Tortuous thoracic aorta. Mild cardiomegaly with LAD coronary artery atherosclerosis. Mediastinum/Nodes: No supraclavicular adenopathy. Partially calcified right paratracheal nodal mass measures 5.4 x 2.8 cm on image 23/series 2. Compare 5.0 x 3.2 cm on the prior exam Adenopathy within the azygoesophageal recess measures 1.7 cm on image 35/series 2 versus 1.6 cm on the prior. Calcified right hilar nodes are grossly similar, but suboptimally evaluated on this noncontrast exam. Lungs/Pleura: Similar mild bilateral pleural thickening. Mild centrilobular and paraseptal emphysema. 7 mm right lower lobe pulmonary nodule on image 89/series 6 is unchanged. A left upper lobe 6 mm nodule is similar on image 75/series 6.  Superior segment left lower lobe 6 mm nodule on image 68/series 6 is unchanged. Musculoskeletal: Similar widespread osseous metastasis. CT ABDOMEN PELVIS FINDINGS Hepatobiliary: Normal liver. Normal gallbladder, without biliary ductal dilatation. Pancreas: Chronic calcific pancreatitis again identified. Pancreatic duct mildly to moderately dilated at 7 mm in the head on image 68/series 2. Compare 4 mm on the prior exam. Partially calcified cystic area within the pancreatic head/ uncinate process is similar at on the order of 1.8 cm on image 73/series 2. No evidence of acute pancreatitis. Spleen: Normal in size, without focal abnormality. Adrenals/Urinary Tract: Normal adrenal glands. Left renal atrophy. No hydronephrosis. Normal urinary bladder. Stomach/Bowel: Normal stomach, without wall thickening. Colonic stool burden suggests constipation. Normal terminal ileum. Normal small bowel. Vascular/Lymphatic: Aortic and branch vessel atherosclerosis. No abdominopelvic adenopathy. Reproductive: Normal prostate. Other: No significant free fluid. Fat containing left inguinal hernia. Musculoskeletal: Extensive sclerotic osseous metastasis. 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 IMPRESSION: CT CHEST IMPRESSION 1. Similar thoracic adenopathy. 2. Similar bilateral pulmonary nodules. 3. Similar osseous metastasis. 4.  Coronary artery atherosclerosis. Aortic atherosclerosis. CT ABDOMEN AND PELVIS IMPRESSION 1. No soft tissue metastasis identified within the abdomen or pelvis. 2. Similar osseous metastasis. 3. Chronic calcific pancreatitis. Pancreatic duct dilatation Is felt to be increased since the prior. Question developing stricture. This could either be re-evaluated at follow-up, or if patient has abdominal symptoms, further characterized with MRI/ MRCP. 4. Possible constipation. Electronically Signed   By: Abigail Miyamoto M.D.   On: 01/17/2017 10:58   Ct Chest Wo Contrast  Result  Date: 01/17/2017 CLINICAL DATA:  Non-small-cell lung cancer with bone metastasis. Liver metastasis. Pancreas metastasis. Ongoing chemotherapy. Prostate cancer with hormone therapy. EXAM: CT CHEST, ABDOMEN AND PELVIS WITHOUT CONTRAST TECHNIQUE: Multidetector CT imaging of the chest, abdomen and pelvis was performed following the standard protocol without IV contrast. COMPARISON:  Chest radiograph 12/24/2016.  CTs of 10/24/2016. FINDINGS: CT CHEST FINDINGS Cardiovascular: A right-sided Port-A-Cath which terminates at the high right atrium. Aortic atherosclerosis. Tortuous thoracic aorta. Mild cardiomegaly with LAD coronary artery atherosclerosis. Mediastinum/Nodes: No supraclavicular adenopathy. Partially calcified right paratracheal nodal mass measures 5.4 x 2.8 cm on image 23/series 2. Compare 5.0 x 3.2 cm on the prior exam Adenopathy within the azygoesophageal recess measures 1.7 cm on image 35/series 2 versus 1.6 cm on the prior. Calcified right hilar nodes are grossly similar, but suboptimally evaluated on this noncontrast exam. Lungs/Pleura: Similar mild bilateral pleural thickening. Mild centrilobular and paraseptal emphysema. 7 mm right lower lobe pulmonary nodule on image 89/series 6 is unchanged. A left upper lobe 6 mm nodule is similar on image 75/series 6. Superior segment left  lower lobe 6 mm nodule on image 68/series 6 is unchanged. Musculoskeletal: Similar widespread osseous metastasis. CT ABDOMEN PELVIS FINDINGS Hepatobiliary: Normal liver. Normal gallbladder, without biliary ductal dilatation. Pancreas: Chronic calcific pancreatitis again identified. Pancreatic duct mildly to moderately dilated at 7 mm in the head on image 68/series 2. Compare 4 mm on the prior exam. Partially calcified cystic area within the pancreatic head/ uncinate process is similar at on the order of 1.8 cm on image 73/series 2. No evidence of acute pancreatitis. Spleen: Normal in size, without focal abnormality. Adrenals/Urinary  Tract: Normal adrenal glands. Left renal atrophy. No hydronephrosis. Normal urinary bladder. Stomach/Bowel: Normal stomach, without wall thickening. Colonic stool burden suggests constipation. Normal terminal ileum. Normal small bowel. Vascular/Lymphatic: Aortic and branch vessel atherosclerosis. No abdominopelvic adenopathy. Reproductive: Normal prostate. Other: No significant free fluid. Fat containing left inguinal hernia. Musculoskeletal: Extensive sclerotic osseous metastasis. 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 IMPRESSION: CT CHEST IMPRESSION 1. Similar thoracic adenopathy. 2. Similar bilateral pulmonary nodules. 3. Similar osseous metastasis. 4.  Coronary artery atherosclerosis. Aortic atherosclerosis. CT ABDOMEN AND PELVIS IMPRESSION 1. No soft tissue metastasis identified within the abdomen or pelvis. 2. Similar osseous metastasis. 3. Chronic calcific pancreatitis. Pancreatic duct dilatation Is felt to be increased since the prior. Question developing stricture. This could either be re-evaluated at follow-up, or if patient has abdominal symptoms, further characterized with MRI/ MRCP. 4. Possible constipation. Electronically Signed   By: Abigail Miyamoto M.D.   On: 01/17/2017 10:58   ASSESSMENT AND PLAN:  This is a very pleasant 56 years old African-American male with metastatic non-small cell lung cancer, adenocarcinoma status post induction systemic chemotherapy was carboplatin and Alimta and currently on second line treatment with immunotherapy with Nivolumab status post 45 cycles and has been tolerating this treatment well. I recommended for the patient to proceed with cycle #46 today as scheduled. I will see him back for follow-up visit in 2 weeks for evaluation before starting the next cycle of his treatment. For the pain management he was given a refill of Percocet today. The patient was advised to call immediately if he has any concerning symptoms in the  interval. The patient voices understanding of current disease status and treatment options and is in agreement with the current care plan. All questions were answered. The patient knows to call the clinic with any problems, questions or concerns. We can certainly see the patient much sooner if necessary.  Disclaimer: This note was dictated with voice recognition software. Similar sounding words can inadvertently be transcribed and may not be corrected upon review.

## 2017-02-21 ENCOUNTER — Ambulatory Visit
Admission: RE | Admit: 2017-02-21 | Discharge: 2017-03-06 | Disposition: A | Discharge status: Home / Self Care | Payer: MEDICARE

## 2017-02-21 ENCOUNTER — Ambulatory Visit
Admission: RE | Admit: 2017-02-21 | Discharge: 2017-03-06 | Disposition: A | Discharge status: Home / Self Care | Payer: MEDICARE | Attending: Hematology & Oncology | Admitting: Hematology & Oncology

## 2017-02-21 DIAGNOSIS — C61 Malignant neoplasm of prostate: Principal | ICD-10-CM

## 2017-02-21 DIAGNOSIS — C7951 Secondary malignant neoplasm of bone: Principal | ICD-10-CM

## 2017-02-21 DIAGNOSIS — G893 Neoplasm related pain (acute) (chronic): Secondary | ICD-10-CM

## 2017-02-21 DIAGNOSIS — C3490 Malignant neoplasm of unspecified part of unspecified bronchus or lung: Secondary | ICD-10-CM

## 2017-02-27 ENCOUNTER — Other Ambulatory Visit (HOSPITAL_BASED_OUTPATIENT_CLINIC_OR_DEPARTMENT_OTHER): Payer: Medicare Other

## 2017-02-27 ENCOUNTER — Ambulatory Visit (HOSPITAL_BASED_OUTPATIENT_CLINIC_OR_DEPARTMENT_OTHER): Payer: Medicare Other

## 2017-02-27 ENCOUNTER — Telehealth: Payer: Self-pay | Admitting: Internal Medicine

## 2017-02-27 ENCOUNTER — Encounter: Payer: Self-pay | Admitting: Internal Medicine

## 2017-02-27 ENCOUNTER — Ambulatory Visit (HOSPITAL_BASED_OUTPATIENT_CLINIC_OR_DEPARTMENT_OTHER): Payer: Medicare Other | Admitting: Internal Medicine

## 2017-02-27 DIAGNOSIS — G893 Neoplasm related pain (acute) (chronic): Secondary | ICD-10-CM

## 2017-02-27 DIAGNOSIS — Z79899 Other long term (current) drug therapy: Secondary | ICD-10-CM

## 2017-02-27 DIAGNOSIS — C7951 Secondary malignant neoplasm of bone: Secondary | ICD-10-CM

## 2017-02-27 DIAGNOSIS — C3491 Malignant neoplasm of unspecified part of right bronchus or lung: Secondary | ICD-10-CM

## 2017-02-27 DIAGNOSIS — Z5112 Encounter for antineoplastic immunotherapy: Secondary | ICD-10-CM | POA: Diagnosis not present

## 2017-02-27 DIAGNOSIS — C801 Malignant (primary) neoplasm, unspecified: Secondary | ICD-10-CM

## 2017-02-27 DIAGNOSIS — C61 Malignant neoplasm of prostate: Secondary | ICD-10-CM

## 2017-02-27 DIAGNOSIS — C787 Secondary malignant neoplasm of liver and intrahepatic bile duct: Secondary | ICD-10-CM

## 2017-02-27 DIAGNOSIS — R5382 Chronic fatigue, unspecified: Secondary | ICD-10-CM

## 2017-02-27 DIAGNOSIS — C3401 Malignant neoplasm of right main bronchus: Secondary | ICD-10-CM

## 2017-02-27 DIAGNOSIS — G8929 Other chronic pain: Secondary | ICD-10-CM

## 2017-02-27 DIAGNOSIS — C7889 Secondary malignant neoplasm of other digestive organs: Secondary | ICD-10-CM

## 2017-02-27 LAB — CBC WITH DIFFERENTIAL/PLATELET
BASO%: 0.1 % (ref 0.0–2.0)
BASOS ABS: 0 10*3/uL (ref 0.0–0.1)
EOS ABS: 0.2 10*3/uL (ref 0.0–0.5)
EOS%: 1.8 % (ref 0.0–7.0)
HEMATOCRIT: 31.6 % — AB (ref 38.4–49.9)
HGB: 10.3 g/dL — ABNORMAL LOW (ref 13.0–17.1)
LYMPH#: 1.9 10*3/uL (ref 0.9–3.3)
LYMPH%: 20.7 % (ref 14.0–49.0)
MCH: 31.7 pg (ref 27.2–33.4)
MCHC: 32.6 g/dL (ref 32.0–36.0)
MCV: 97.2 fL (ref 79.3–98.0)
MONO#: 0.8 10*3/uL (ref 0.1–0.9)
MONO%: 9 % (ref 0.0–14.0)
NEUT#: 6.4 10*3/uL (ref 1.5–6.5)
NEUT%: 68.4 % (ref 39.0–75.0)
Platelets: 283 10*3/uL (ref 140–400)
RBC: 3.25 10*6/uL — ABNORMAL LOW (ref 4.20–5.82)
RDW: 20.5 % — ABNORMAL HIGH (ref 11.0–14.6)
WBC: 9.4 10*3/uL (ref 4.0–10.3)

## 2017-02-27 LAB — COMPREHENSIVE METABOLIC PANEL
ALBUMIN: 2.5 g/dL — AB (ref 3.5–5.0)
ALK PHOS: 141 U/L (ref 40–150)
ALT: 15 U/L (ref 0–55)
ANION GAP: 12 meq/L — AB (ref 3–11)
AST: 14 U/L (ref 5–34)
BUN: 42.2 mg/dL — AB (ref 7.0–26.0)
CALCIUM: 9.4 mg/dL (ref 8.4–10.4)
CHLORIDE: 107 meq/L (ref 98–109)
CO2: 21 mEq/L — ABNORMAL LOW (ref 22–29)
Creatinine: 2 mg/dL — ABNORMAL HIGH (ref 0.7–1.3)
EGFR: 42 mL/min/{1.73_m2} — AB (ref >90)
Glucose: 82 mg/dl (ref 70–140)
POTASSIUM: 4.5 meq/L (ref 3.5–5.1)
Sodium: 139 mEq/L (ref 136–145)
Total Bilirubin: <0.22 mg/dL (ref 0.20–1.20)
Total Protein: 6.5 g/dL (ref 6.4–8.3)

## 2017-02-27 LAB — TSH: TSH: 1.545 m[IU]/L (ref 0.320–4.118)

## 2017-02-27 MED ORDER — OXYCODONE-ACETAMINOPHEN 5-325 MG PO TABS: 1.0000 | ORAL_TABLET | ORAL | 0 refills | Status: DC | PRN

## 2017-02-27 MED ORDER — HEPARIN SOD (PORK) LOCK FLUSH 100 UNIT/ML IV SOLN
500.0000 [IU] | Freq: Once | INTRAVENOUS | Status: AC | PRN
  Administered 2017-02-27: 500 [IU]
  Filled 2017-02-27: qty 5

## 2017-02-27 MED ORDER — SODIUM CHLORIDE 0.9 % IV SOLN
Freq: Once | INTRAVENOUS | Status: AC
  Administered 2017-02-27: 10:00:00 via INTRAVENOUS

## 2017-02-27 MED ORDER — SODIUM CHLORIDE 0.9 % IV SOLN
240.0000 mg | Freq: Once | INTRAVENOUS | Status: AC
  Administered 2017-02-27: 240 mg via INTRAVENOUS
  Filled 2017-02-27: qty 24

## 2017-02-27 MED ORDER — SODIUM CHLORIDE 0.9 % IJ SOLN
10.0000 mL | INTRAMUSCULAR | Status: DC | PRN
Start: 2017-02-27 — End: 2017-02-27
  Administered 2017-02-27: 10 mL
  Filled 2017-02-27: qty 10

## 2017-02-27 NOTE — Patient Instructions (Signed)
[facility] Discharge Instructions for Patients Receiving Chemotherapy  Today you received the following chemotherapy agents nivolumab   To help prevent nausea and vomiting after your treatment, we encourage you to take your nausea medication as directed   If you develop nausea and vomiting that is not controlled by your nausea medication, call the clinic.   BELOW ARE SYMPTOMS THAT SHOULD BE REPORTED IMMEDIATELY:  *FEVER GREATER THAN 100.5 F  *CHILLS WITH OR WITHOUT FEVER  NAUSEA AND VOMITING THAT IS NOT CONTROLLED WITH YOUR NAUSEA MEDICATION  *UNUSUAL SHORTNESS OF BREATH  *UNUSUAL BRUISING OR BLEEDING  TENDERNESS IN MOUTH AND THROAT WITH OR WITHOUT PRESENCE OF ULCERS  *URINARY PROBLEMS  *BOWEL PROBLEMS  UNUSUAL RASH Items with * indicate a potential emergency and should be followed up as soon as possible.  Feel free to call the clinic you have any questions or concerns. The clinic phone number is [phone].  

## 2017-02-27 NOTE — Telephone Encounter (Signed)
Scheduled appt per 7/11 los - patient to pick up new schedule next visit.

## 2017-02-27 NOTE — Progress Notes (Signed)
[place] [phone]   [phone]) [phone]  OFFICE PROGRESS NOTE  Sherlynn Stalls., MD [place] [address]  DIAGNOSIS:  1) Stage IV (T2b, N2, M1b) non-small cell lung cancer, adenocarcinoma with negative EGFR mutation and negative gene translocation presented with a large right hilar mass as well as mediastinal lymphadenopathy and metastatic disease to the liver, bone and pancreas diagnosed in February 2016. 2) prostate adenocarcinoma [facility] at [facility] with Gleason score 9 (4+5). 3) treatment with Radium 223 for prostate cancer at [facility] on 02/21/2017  PRIOR THERAPY:  1) Systemic chemotherapy with carboplatin for AUC of 5 and Alimta 500 MG/M2 every 3 weeks. Status post 6 cycles. 2) Enzalutamide for prostate cancer at [place]:  Immunotherapy with Nivolumab 240 MG every 2 weeks, status post 46 cycles.  INTERVAL HISTORY: Francisco Love 56 y.o. male returns to the clinic today for follow-up visit accompanied by his brother-in-law. The patient continues to do fine and tolerated his treatment well. He denied having any chest pain, cough or hemoptysis but has baseline shortness breath and he is currently on home oxygen. He denied having any fever or chills. She has no nausea, vomiting, diarrhea or constipation. He underwent treatment with Radium 223 at [facility] on 02/21/2017 and tolerated the first dose well. He is here today for evaluation before starting cycle #47 of his treatment with Nivolumab.   MEDICAL HISTORY: Past Medical History:  Diagnosis Date  . Chronic fatigue 04/12/2016  . Chronic pain 04/12/2016  . Chronic renal disease, stage III   . Metastatic cancer ([place])     ALLERGIES:  has No Known Allergies.  MEDICATIONS:  Current Outpatient Prescriptions  Medication Sig Dispense Refill  . acetaminophen (TYLENOL) 325 MG tablet Take 2 tablets (650 mg total) by mouth every 6  (six) hours as needed for mild pain (or Fever >/= 101).    Marland Kitchen albuterol (PROVENTIL) (2.5 MG/3ML) 0.083% nebulizer solution Take 3 mLs (2.5 mg total) by nebulization every 2 (two) hours as needed for wheezing. 75 mL 12  . AMITIZA 24 MCG capsule Take 24 mcg by mouth 2 (two) times daily with a meal.     . bicalutamide (CASODEX) 50 MG tablet Take 50 mg by mouth daily.     . diphenhydrAMINE (BENADRYL) 25 MG tablet Take 25 mg by mouth at bedtime as needed for itching.     . feeding supplement, ENSURE ENLIVE, (ENSURE ENLIVE) LIQD Take 237 mLs by mouth 2 (two) times daily between meals. 638 mL 12  . folic acid (FOLVITE) 1 MG tablet Take 1 mg by mouth daily.    . furosemide (LASIX) 20 MG tablet Take 1 tablet (20 mg total) by mouth daily as needed for fluid or edema. 30 tablet 0  . levocetirizine (XYZAL) 5 MG tablet Take 5 mg by mouth.    . lidocaine-prilocaine (EMLA) cream Apply 1 application topically as needed. Apply to port site prior to chemotherapy. 30 g 0  . magnesium oxide (MAG-OX) 400 MG tablet Take 400 mg by mouth daily.    . metoprolol tartrate (LOPRESSOR) 25 MG tablet Take 0.5 tablets (12.5 mg total) by mouth 2 (two) times daily. 60 tablet 0  . mirtazapine (REMERON) 15 MG tablet Take 1 tablet (15 mg total) by mouth at bedtime.    Marland Kitchen oxyCODONE-acetaminophen (PERCOCET/ROXICET) 5-325 MG tablet Take 1 tablet by mouth every 4 (four) hours as needed  for severe pain. 60 tablet 0  . polyethylene glycol (MIRALAX / GLYCOLAX) packet Take 17 g by mouth daily as needed for mild constipation.     . prochlorperazine (COMPAZINE) 10 MG tablet Take 1 tablet (10 mg total) by mouth every 6 (six) hours as needed for nausea or vomiting. 30 tablet 1  . senna-docusate (SENOKOT-S) 8.6-50 MG per tablet Take 1 tablet by mouth daily.    . sodium bicarbonate 650 MG tablet Take 1 tablet (650 mg total) by mouth daily. 30 tablet 0  . SUMAtriptan (IMITREX) 25 MG tablet Take 25 mg by mouth every 2 (two) hours as needed for  migraine.     . tamsulosin (FLOMAX) 0.4 MG CAPS capsule Take 0.4 mg by mouth daily.     . valACYclovir (VALTREX) 1000 MG tablet Take 1,000 mg by mouth 2 (two) times daily.    [name] 40 MG capsule Take 160 mg by mouth daily.      No current facility-administered medications for this visit.     SURGICAL HISTORY:  Past Surgical History:  Procedure Laterality Date  . APPENDECTOMY    . VIDEO BRONCHOSCOPY N/A 09/21/2014   Procedure: VIDEO BRONCHOSCOPY WITH FLUORO;  Surgeon: Rigoberto Noel, MD;  Location: [place];  Service: Cardiopulmonary;  Laterality: N/A;  . VIDEO BRONCHOSCOPY WITH ENDOBRONCHIAL ULTRASOUND N/A 09/23/2014   Procedure: VIDEO BRONCHOSCOPY WITH ENDOBRONCHIAL ULTRASOUND;  Surgeon: Rigoberto Noel, MD;  Location: [place];  Service: Pulmonary;  Laterality: N/A;    REVIEW OF SYSTEMS:  A comprehensive review of systems was negative except for: Constitutional: positive for fatigue Respiratory: positive for dyspnea on exertion Musculoskeletal: positive for muscle weakness   PHYSICAL EXAMINATION: General appearance: alert, cooperative, fatigued and no distress Head: Normocephalic, without obvious abnormality, atraumatic Neck: no adenopathy, no JVD, supple, symmetrical, trachea midline and thyroid not enlarged, symmetric, no tenderness/mass/nodules Lymph nodes: Cervical, supraclavicular, and axillary nodes normal. Resp: clear to auscultation bilaterally Back: symmetric, no curvature. ROM normal. No CVA tenderness. Cardio: regular rate and rhythm, S1, S2 normal, no murmur, click, rub or gallop GI: soft, non-tender; bowel sounds normal; no masses,  no organomegaly Extremities: extremities normal, atraumatic, no cyanosis or edema   ECOG PERFORMANCE STATUS: 1 - Symptomatic but completely ambulatory  Blood pressure (!) 134/91, pulse 77, temperature 98.3 F (36.8 C), temperature source Oral, resp. rate 18, height 5' 9" (1.753 m), weight 191 lb 4.8 oz (86.8 kg), SpO2 100 %.  LABORATORY  DATA: Lab Results  Component Value Date   WBC 9.4 02/27/2017   HGB 10.3 (L) 02/27/2017   HCT 31.6 (L) 02/27/2017   MCV 97.2 02/27/2017   PLT 283 02/27/2017      Chemistry      Component Value Date/Time   NA 142 02/13/2017 0809   K 4.1 02/13/2017 0809   CL 104 12/24/2016 0849   CO2 20 (L) 02/13/2017 0809   BUN 37.8 (H) 02/13/2017 0809   CREATININE 1.9 (H) 02/13/2017 0809      Component Value Date/Time   CALCIUM 9.4 02/13/2017 0809   ALKPHOS 144 02/13/2017 0809   AST 14 02/13/2017 0809   ALT 19 02/13/2017 0809   BILITOT 0.28 02/13/2017 0809       RADIOGRAPHIC STUDIES: No results found. ASSESSMENT AND PLAN:  This is a very pleasant 56 years old African-American male with metastatic non-small cell lung cancer, adenocarcinoma status post induction systemic chemotherapy was carboplatin and Alimta and currently on second line treatment with immunotherapy with Nivolumab status post  46 cycles and has been tolerating this treatment well. I recommended for the patient to proceed with cycle #47 today as scheduled. I also advise him that he will need close monitoring of his blood count after the treatment with Radium 223. I gave him a refill of Percocet today. He will come back for follow-up visit in 2 weeks for evaluation before starting cycle #48. The patient was advised to call immediately if she has any concerning symptoms in the interval. The patient voices understanding of current disease status and treatment options and is in agreement with the current care plan. All questions were answered. The patient knows to call the clinic with any problems, questions or concerns. We can certainly see the patient much sooner if necessary. I spent 10 minutes counseling the patient face to face. The total time spent in the appointment was 15 minutes.  Disclaimer: This note was dictated with voice recognition software. Similar sounding words can inadvertently be transcribed and may not be  corrected upon review.

## 2017-02-27 NOTE — Progress Notes (Signed)
Ok to treat with SCr 2.0 per Dr. Julien Nordmann.

## 2017-03-01 MED FILL — XTANDI/40MG/CAPS: XTANDI/40MG/CAPS | 30 days supply | Qty: 120 | Fill #4

## 2017-03-13 ENCOUNTER — Ambulatory Visit (HOSPITAL_BASED_OUTPATIENT_CLINIC_OR_DEPARTMENT_OTHER): Payer: Medicare Other

## 2017-03-13 ENCOUNTER — Encounter: Payer: Self-pay | Admitting: *Deleted

## 2017-03-13 ENCOUNTER — Ambulatory Visit (HOSPITAL_BASED_OUTPATIENT_CLINIC_OR_DEPARTMENT_OTHER): Payer: Medicare Other | Admitting: Internal Medicine

## 2017-03-13 ENCOUNTER — Telehealth: Payer: Self-pay | Admitting: Internal Medicine

## 2017-03-13 ENCOUNTER — Encounter: Payer: Self-pay | Admitting: Internal Medicine

## 2017-03-13 ENCOUNTER — Other Ambulatory Visit (HOSPITAL_BASED_OUTPATIENT_CLINIC_OR_DEPARTMENT_OTHER): Payer: Medicare Other

## 2017-03-13 VITALS — BP 114/72

## 2017-03-13 VITALS — BP 84/62 | HR 86 | Temp 97.8°F | Resp 18 | Ht 69.0 in | Wt 174.0 lb

## 2017-03-13 DIAGNOSIS — I959 Hypotension, unspecified: Secondary | ICD-10-CM

## 2017-03-13 DIAGNOSIS — C3401 Malignant neoplasm of right main bronchus: Secondary | ICD-10-CM

## 2017-03-13 DIAGNOSIS — Z5112 Encounter for antineoplastic immunotherapy: Secondary | ICD-10-CM

## 2017-03-13 DIAGNOSIS — C3491 Malignant neoplasm of unspecified part of right bronchus or lung: Secondary | ICD-10-CM

## 2017-03-13 DIAGNOSIS — G893 Neoplasm related pain (acute) (chronic): Secondary | ICD-10-CM

## 2017-03-13 DIAGNOSIS — C61 Malignant neoplasm of prostate: Secondary | ICD-10-CM | POA: Diagnosis not present

## 2017-03-13 DIAGNOSIS — I951 Orthostatic hypotension: Secondary | ICD-10-CM

## 2017-03-13 DIAGNOSIS — C787 Secondary malignant neoplasm of liver and intrahepatic bile duct: Secondary | ICD-10-CM

## 2017-03-13 DIAGNOSIS — C7889 Secondary malignant neoplasm of other digestive organs: Secondary | ICD-10-CM

## 2017-03-13 DIAGNOSIS — C7951 Secondary malignant neoplasm of bone: Secondary | ICD-10-CM

## 2017-03-13 DIAGNOSIS — R5382 Chronic fatigue, unspecified: Secondary | ICD-10-CM

## 2017-03-13 DIAGNOSIS — G8929 Other chronic pain: Secondary | ICD-10-CM

## 2017-03-13 DIAGNOSIS — C801 Malignant (primary) neoplasm, unspecified: Secondary | ICD-10-CM

## 2017-03-13 LAB — CBC WITH DIFFERENTIAL/PLATELET
BASO%: 1.1 % (ref 0.0–2.0)
BASOS ABS: 0.1 10*3/uL (ref 0.0–0.1)
EOS ABS: 0.4 10*3/uL (ref 0.0–0.5)
EOS%: 4.7 % (ref 0.0–7.0)
HCT: 33.8 % — ABNORMAL LOW (ref 38.4–49.9)
HEMOGLOBIN: 11.2 g/dL — AB (ref 13.0–17.1)
LYMPH%: 17.3 % (ref 14.0–49.0)
MCH: 32 pg (ref 27.2–33.4)
MCHC: 33.1 g/dL (ref 32.0–36.0)
MCV: 96.7 fL (ref 79.3–98.0)
MONO#: 1.2 10*3/uL — ABNORMAL HIGH (ref 0.1–0.9)
MONO%: 14.5 % — AB (ref 0.0–14.0)
NEUT#: 5.4 10*3/uL (ref 1.5–6.5)
NEUT%: 62.4 % (ref 39.0–75.0)
PLATELETS: 352 10*3/uL (ref 140–400)
RBC: 3.5 10*6/uL — ABNORMAL LOW (ref 4.20–5.82)
RDW: 22.2 % — AB (ref 11.0–14.6)
WBC: 8.6 10*3/uL (ref 4.0–10.3)
lymph#: 1.5 10*3/uL (ref 0.9–3.3)

## 2017-03-13 LAB — COMPREHENSIVE METABOLIC PANEL
ALBUMIN: 2.6 g/dL — AB (ref 3.5–5.0)
ALK PHOS: 177 U/L — AB (ref 40–150)
ALT: 18 U/L (ref 0–55)
ANION GAP: 13 meq/L — AB (ref 3–11)
AST: 21 U/L (ref 5–34)
BUN: 29 mg/dL — AB (ref 7.0–26.0)
CALCIUM: 9.3 mg/dL (ref 8.4–10.4)
CHLORIDE: 108 meq/L (ref 98–109)
CO2: 19 mEq/L — ABNORMAL LOW (ref 22–29)
Creatinine: 2.7 mg/dL — ABNORMAL HIGH (ref 0.7–1.3)
EGFR: 29 mL/min/{1.73_m2} — AB (ref >90)
Glucose: 103 mg/dl (ref 70–140)
POTASSIUM: 4.5 meq/L (ref 3.5–5.1)
Sodium: 140 mEq/L (ref 136–145)
Total Bilirubin: 0.27 mg/dL (ref 0.20–1.20)
Total Protein: 6.9 g/dL (ref 6.4–8.3)

## 2017-03-13 LAB — TECHNOLOGIST REVIEW

## 2017-03-13 MED ORDER — SODIUM CHLORIDE 0.9 % IJ SOLN
10.0000 mL | INTRAMUSCULAR | Status: DC | PRN
  Administered 2017-03-13: 10 mL
  Filled 2017-03-13: qty 10

## 2017-03-13 MED ORDER — HEPARIN SOD (PORK) LOCK FLUSH 100 UNIT/ML IV SOLN
500.0000 [IU] | Freq: Once | INTRAVENOUS | Status: AC | PRN
  Administered 2017-03-13: 500 [IU]
  Filled 2017-03-13: qty 5

## 2017-03-13 MED ORDER — SODIUM CHLORIDE 0.9 % IV SOLN
Freq: Once | INTRAVENOUS | Status: AC
  Administered 2017-03-13: 12:00:00 via INTRAVENOUS

## 2017-03-13 MED ORDER — NIVOLUMAB CHEMO INJECTION 100 MG/10ML
240.0000 mg | Freq: Once | INTRAVENOUS | Status: AC
  Administered 2017-03-13: 240 mg via INTRAVENOUS
  Filled 2017-03-13: qty 24

## 2017-03-13 MED ORDER — OXYCODONE-ACETAMINOPHEN 5-325 MG PO TABS: 1.0000 | ORAL_TABLET | ORAL | 0 refills | Status: DC | PRN

## 2017-03-13 MED ORDER — SODIUM CHLORIDE 0.9 % IV SOLN
INTRAVENOUS | Status: AC
  Administered 2017-03-13: 10:00:00 via INTRAVENOUS

## 2017-03-13 NOTE — Telephone Encounter (Signed)
Patient already on schedule q2w thru end of September. Central radiology will call re scan.

## 2017-03-13 NOTE — Patient Instructions (Signed)
[facility] Discharge Instructions for Patients Receiving Chemotherapy  Today you received the following chemotherapy agents Opdivo   To help prevent nausea and vomiting after your treatment, we encourage you to take your nausea medication as directed.   If you develop nausea and vomiting that is not controlled by your nausea medication, call the clinic.   BELOW ARE SYMPTOMS THAT SHOULD BE REPORTED IMMEDIATELY:  *FEVER GREATER THAN 100.5 F  *CHILLS WITH OR WITHOUT FEVER  NAUSEA AND VOMITING THAT IS NOT CONTROLLED WITH YOUR NAUSEA MEDICATION  *UNUSUAL SHORTNESS OF BREATH  *UNUSUAL BRUISING OR BLEEDING  TENDERNESS IN MOUTH AND THROAT WITH OR WITHOUT PRESENCE OF ULCERS  *URINARY PROBLEMS  *BOWEL PROBLEMS  UNUSUAL RASH Items with * indicate a potential emergency and should be followed up as soon as possible.  Feel free to call the clinic you have any questions or concerns. The clinic phone number is [phone].  Please show the [facility] at check-in to the Emergency Department and triage nurse.   Dehydration, Adult Dehydration is a condition in which there is not enough fluid or water in the body. This happens when you lose more fluids than you take in. Important organs, such as the kidneys, brain, and heart, cannot function without a proper amount of fluids. Any loss of fluids from the body can lead to dehydration. Dehydration can range from mild to severe. This condition should be treated right away to prevent it from becoming severe. What are the causes? This condition may be caused by:  Vomiting.  Diarrhea.  Excessive sweating, such as from heat exposure or exercise.  Not drinking enough fluid, especially: ? When ill. ? While doing activity that requires a lot of energy.  Excessive urination.  Fever.  Infection.  Certain medicines, such as medicines that cause the body to lose excess fluid (diuretics).  Inability to access safe  drinking water.  Reduced physical ability to get adequate water and food.  What increases the risk? This condition is more likely to develop in people:  Who have a poorly controlled long-term (chronic) illness, such as diabetes, heart disease, or kidney disease.  Who are age 80 or older.  Who are disabled.  Who live in a place with high altitude.  Who play endurance sports.  What are the signs or symptoms? Symptoms of mild dehydration may include:  Thirst.  Dry lips.  Slightly dry mouth.  Dry, warm skin.  Dizziness. Symptoms of moderate dehydration may include:  Very dry mouth.  Muscle cramps.  Dark urine. Urine may be the color of tea.  Decreased urine production.  Decreased tear production.  Heartbeat that is irregular or faster than normal (palpitations).  Headache.  Light-headedness, especially when you stand up from a sitting position.  Fainting (syncope). Symptoms of severe dehydration may include:  Changes in skin, such as: ? Cold and clammy skin. ? Blotchy (mottled) or pale skin. ? Skin that does not quickly return to normal after being lightly pinched and released (poor skin turgor).  Changes in body fluids, such as: ? Extreme thirst. ? No tear production. ? Inability to sweat when body temperature is high, such as in hot weather. ? Very little urine production.  Changes in vital signs, such as: ? Weak pulse. ? Pulse that is more than 100 beats a minute when sitting still. ? Rapid breathing. ? Low blood pressure.  Other changes, such as: ? Sunken eyes. ? Cold hands and feet. ? Confusion. ? Lack  of energy (lethargy). ? Difficulty waking up from sleep. ? Short-term weight loss. ? Unconsciousness. How is this diagnosed? This condition is diagnosed based on your symptoms and a physical exam. Blood and urine tests may be done to help confirm the diagnosis. How is this treated? Treatment for this condition depends on the severity. Mild  or moderate dehydration can often be treated at home. Treatment should be started right away. Do not wait until dehydration becomes severe. Severe dehydration is an emergency and it needs to be treated in a hospital. Treatment for mild dehydration may include:  Drinking more fluids.  Replacing salts and minerals in your blood (electrolytes) that you may have lost. Treatment for moderate dehydration may include:  Drinking an oral rehydration solution (ORS). This is a drink that helps you replace fluids and electrolytes (rehydrate). It can be found at pharmacies and retail stores. Treatment for severe dehydration may include:  Receiving fluids through an IV tube.  Receiving an electrolyte solution through a feeding tube that is passed through your nose and into your stomach (nasogastric tube, or NG tube).  Correcting any abnormalities in electrolytes.  Treating the underlying cause of dehydration. Follow these instructions at home:  If directed by your health care provider, drink an ORS: ? Make an ORS by following instructions on the package. ? Start by drinking small amounts, about  cup (120 mL) every 5-10 minutes. ? Slowly increase how much you drink until you have taken the amount recommended by your health care provider.  Drink enough clear fluid to keep your urine clear or pale yellow. If you were told to drink an ORS, finish the ORS first, then start slowly drinking other clear fluids. Drink fluids such as: ? Water. Do not drink only water. Doing that can lead to having too little salt (sodium) in the body (hyponatremia). ? Ice chips. ? Fruit juice that you have added water to (diluted fruit juice). ? Low-calorie sports drinks.  Avoid: ? Alcohol. ? Drinks that contain a lot of sugar. These include high-calorie sports drinks, fruit juice that is not diluted, and soda. ? Caffeine. ? Foods that are greasy or contain a lot of fat or sugar.  Take over-the-counter and prescription  medicines only as told by your health care provider.  Do not take sodium tablets. This can lead to having too much sodium in the body (hypernatremia).  Eat foods that contain a healthy balance of electrolytes, such as bananas, oranges, potatoes, tomatoes, and spinach.  Keep all follow-up visits as told by your health care provider. This is important. Contact a health care provider if:  You have abdominal pain that: ? Gets worse. ? Stays in one area (localizes).  You have a rash.  You have a stiff neck.  You are more irritable than usual.  You are sleepier or more difficult to wake up than usual.  You feel weak or dizzy.  You feel very thirsty.  You have urinated only a small amount of very dark urine over 6-8 hours. Get help right away if:  You have symptoms of severe dehydration.  You cannot drink fluids without vomiting.  Your symptoms get worse with treatment.  You have a fever.  You have a severe headache.  You have vomiting or diarrhea that: ? Gets worse. ? Does not go away.  You have blood or green matter (bile) in your vomit.  You have blood in your stool. This may cause stool to look black and tarry.  You  have not urinated in 6-8 hours.  You faint.  Your heart rate while sitting still is over 100 beats a minute.  You have trouble breathing. This information is not intended to replace advice given to you by your health care provider. Make sure you discuss any questions you have with your health care provider. Document Released: 08/06/2005 Document Revised: 03/02/2016 Document Reviewed: 09/30/2015 Elsevier Interactive Patient Education  [employer].

## 2017-03-13 NOTE — Progress Notes (Signed)
Oncology Nurse Navigator Documentation  Oncology Nurse Navigator Flowsheets 03/13/2017  Navigator Location [facility]  Navigator Encounter Type Clinic/MDC/spoke with patient and his brother today.  They are in need of gas card.  I helped them completed lung cancer initiative returning application form.  They were thankful for the help.  I will also reach out to CSW for other suggestions to help.   Patient Visit Type MedOnc  Treatment Phase Treatment  Barriers/Navigation Needs Financial  Interventions Other  Acuity Level 2  Acuity Level 2 Other  Time Spent with Patient 30

## 2017-03-13 NOTE — Progress Notes (Signed)
Ok to treat with B/P per MD Julien Nordmann. IV fluids to be given (see eMAR).

## 2017-03-13 NOTE — Progress Notes (Signed)
[place] [phone]   [phone]  OFFICE PROGRESS NOTE  Sherlynn Stalls., MD [place] [address]  DIAGNOSIS:  1) Stage IV (T2b, N2, M1b) non-small cell lung cancer, adenocarcinoma with negative EGFR mutation and negative gene translocation presented with a large right hilar mass as well as mediastinal lymphadenopathy and metastatic disease to the liver, bone and pancreas diagnosed in February 2016. 2) prostate adenocarcinoma diagnosed at [facility] with Gleason score 9 (4+5). 3) treatment with Radium 223 for prostate cancer at [facility] on 02/21/2017  PRIOR THERAPY:  1) Systemic chemotherapy with carboplatin for AUC of 5 and Alimta 500 MG/M2 every 3 weeks. Status post 6 cycles. 2) Enzalutamide for prostate cancer at [place]:  Immunotherapy with Nivolumab 240 MG every 2 weeks, status post 47 cycles.  INTERVAL HISTORY: Francisco Love 56 y.o. male returns to the clinic today for follow-up visit accompanied by his brother-in-law. The patient is feeling fine today with no specific complaints except for persistent low back pain. He also has increasing fatigue and weight loss. He denied having any fever or chills. He has no nausea, vomiting, diarrhea or constipation. He has no chest pain but continues to have the baseline shortness of breath with mild cough and no hemoptysis. He has been tolerating his treatment with Nivolumab fairly well. He is here today for evaluation before starting cycle #48.   MEDICAL HISTORY: Past Medical History:  Diagnosis Date  . Chronic fatigue 04/12/2016  . Chronic pain 04/12/2016  . Chronic renal disease, stage III   . Metastatic cancer ([place])     ALLERGIES:  has No Known Allergies.  MEDICATIONS:  Current Outpatient Prescriptions  Medication Sig Dispense Refill  . acetaminophen (TYLENOL) 325 MG tablet Take 2 tablets (650 mg total) by mouth every 6 (six)  hours as needed for mild pain (or Fever >/= 101).    Marland Kitchen albuterol (PROVENTIL) (2.5 MG/3ML) 0.083% nebulizer solution Take 3 mLs (2.5 mg total) by nebulization every 2 (two) hours as needed for wheezing. 75 mL 12  . AMITIZA 24 MCG capsule Take 24 mcg by mouth 2 (two) times daily with a meal.     . bicalutamide (CASODEX) 50 MG tablet Take 50 mg by mouth daily.     . diphenhydrAMINE (BENADRYL) 25 MG tablet Take 25 mg by mouth at bedtime as needed for itching.     . feeding supplement, ENSURE ENLIVE, (ENSURE ENLIVE) LIQD Take 237 mLs by mouth 2 (two) times daily between meals. 664 mL 12  . folic acid (FOLVITE) 1 MG tablet Take 1 mg by mouth daily.    . furosemide (LASIX) 20 MG tablet Take 1 tablet (20 mg total) by mouth daily as needed for fluid or edema. 30 tablet 0  . levocetirizine (XYZAL) 5 MG tablet Take 5 mg by mouth.    . lidocaine-prilocaine (EMLA) cream Apply 1 application topically as needed. Apply to port site prior to chemotherapy. 30 g 0  . magnesium oxide (MAG-OX) 400 MG tablet Take 400 mg by mouth daily.    . metoprolol tartrate (LOPRESSOR) 25 MG tablet Take 0.5 tablets (12.5 mg total) by mouth 2 (two) times daily. 60 tablet 0  . mirtazapine (REMERON) 15 MG tablet Take 1 tablet (15 mg total) by mouth at bedtime.    Marland Kitchen oxyCODONE-acetaminophen (PERCOCET/ROXICET) 5-325 MG tablet Take 1 tablet by mouth every 4 (four) hours as needed for  severe pain. 60 tablet 0  . polyethylene glycol (MIRALAX / GLYCOLAX) packet Take 17 g by mouth daily as needed for mild constipation.     . prochlorperazine (COMPAZINE) 10 MG tablet Take 1 tablet (10 mg total) by mouth every 6 (six) hours as needed for nausea or vomiting. 30 tablet 1  . senna-docusate (SENOKOT-S) 8.6-50 MG per tablet Take 1 tablet by mouth daily.    . sodium bicarbonate 650 MG tablet Take 1 tablet (650 mg total) by mouth daily. 30 tablet 0  . SUMAtriptan (IMITREX) 25 MG tablet Take 25 mg by mouth every 2 (two) hours as needed for migraine.       . tamsulosin (FLOMAX) 0.4 MG CAPS capsule Take 0.4 mg by mouth daily.     . valACYclovir (VALTREX) 1000 MG tablet Take 1,000 mg by mouth 2 (two) times daily.    [name] 40 MG capsule Take 160 mg by mouth daily.      No current facility-administered medications for this visit.     SURGICAL HISTORY:  Past Surgical History:  Procedure Laterality Date  . APPENDECTOMY    . VIDEO BRONCHOSCOPY N/A 09/21/2014   Procedure: VIDEO BRONCHOSCOPY WITH FLUORO;  Surgeon: Rigoberto Noel, MD;  Location: [facility];  Service: Cardiopulmonary;  Laterality: N/A;  . VIDEO BRONCHOSCOPY WITH ENDOBRONCHIAL ULTRASOUND N/A 09/23/2014   Procedure: VIDEO BRONCHOSCOPY WITH ENDOBRONCHIAL ULTRASOUND;  Surgeon: Rigoberto Noel, MD;  Location: [facility];  Service: Pulmonary;  Laterality: N/A;    REVIEW OF SYSTEMS:  Constitutional: positive for fatigue and weight loss Eyes: negative Ears, nose, mouth, throat, and face: negative Respiratory: positive for dyspnea on exertion Cardiovascular: negative Gastrointestinal: negative Genitourinary:negative Integument/breast: negative Hematologic/lymphatic: negative Musculoskeletal:positive for back pain and muscle weakness Neurological: negative Behavioral/Psych: negative Endocrine: negative Allergic/Immunologic: negative   PHYSICAL EXAMINATION: General appearance: alert, cooperative, fatigued and no distress Head: Normocephalic, without obvious abnormality, atraumatic Neck: no adenopathy, no JVD, supple, symmetrical, trachea midline and thyroid not enlarged, symmetric, no tenderness/mass/nodules Lymph nodes: Cervical, supraclavicular, and axillary nodes normal. Resp: clear to auscultation bilaterally Back: symmetric, no curvature. ROM normal. No CVA tenderness. Cardio: regular rate and rhythm, S1, S2 normal, no murmur, click, rub or gallop GI: soft, non-tender; bowel sounds normal; no masses,  no organomegaly Extremities: extremities normal, atraumatic, no cyanosis or  edema Neurologic: Alert and oriented X 3, normal strength and tone. Normal symmetric reflexes. Normal coordination and gait   ECOG PERFORMANCE STATUS: 1 - Symptomatic but completely ambulatory  Blood pressure (!) 84/62, pulse 86, temperature 97.8 F (36.6 C), temperature source Oral, resp. rate 18, height 5' 9"  (1.753 m), weight 174 lb (78.9 kg), SpO2 100 %.  LABORATORY DATA: Lab Results  Component Value Date   WBC 8.6 03/13/2017   HGB 11.2 (L) 03/13/2017   HCT 33.8 (L) 03/13/2017   MCV 96.7 03/13/2017   PLT 352 03/13/2017      Chemistry      Component Value Date/Time   NA 139 02/27/2017 0739   K 4.5 02/27/2017 0739   CL 104 12/24/2016 0849   CO2 21 (L) 02/27/2017 0739   BUN 42.2 (H) 02/27/2017 0739   CREATININE 2.0 (H) 02/27/2017 0739      Component Value Date/Time   CALCIUM 9.4 02/27/2017 0739   ALKPHOS 141 02/27/2017 0739   AST 14 02/27/2017 0739   ALT 15 02/27/2017 0739   BILITOT <0.22 02/27/2017 0739       RADIOGRAPHIC STUDIES: No results found. ASSESSMENT AND PLAN:  This  is a very pleasant 56 years old African-American male with metastatic non-small cell lung cancer, adenocarcinoma status post induction systemic chemotherapy was carboplatin and Alimta and currently on second line treatment with immunotherapy with Nivolumab status post 47 cycles and has been tolerating this treatment well. I recommended for the patient to proceed with cycle #48 today. His back pain is likely secondary to recent treatment with Radium 223. I recommended for him to continue with his pain medication for now. His recent weight loss is likely secondary to diuresis. The patient was edematous in the last 2 visits. For hypotension, I will arrange for the patient to receive 1 L of normal saline in the clinic today. I will see the patient back for follow-up visit in 2 weeks for evaluation after repeating CT scan of the chest, abdomen and pelvis without contrast for restaging of his  disease. The patient was advised to call immediately if he has any concerning symptoms in the interval. The patient voices understanding of current disease status and treatment options and is in agreement with the current care plan. All questions were answered. The patient knows to call the clinic with any problems, questions or concerns. We can certainly see the patient much sooner if necessary.  Disclaimer: This note was dictated with voice recognition software. Similar sounding words can inadvertently be transcribed and may not be corrected upon review.

## 2017-03-18 ENCOUNTER — Ambulatory Visit (INDEPENDENT_AMBULATORY_CARE_PROVIDER_SITE_OTHER): Payer: Medicare Other | Admitting: Gastroenterology

## 2017-03-18 ENCOUNTER — Encounter: Payer: Self-pay | Admitting: Gastroenterology

## 2017-03-18 VITALS — BP 82/62 | HR 125 | Ht 69.0 in | Wt 170.0 lb

## 2017-03-18 DIAGNOSIS — Q453 Other congenital malformations of pancreas and pancreatic duct: Secondary | ICD-10-CM

## 2017-03-18 NOTE — Progress Notes (Signed)
HPI: This is a   very pleasant 56 year old man  who was referred to me by Curt Bears, MD  to evaluate  abnormal CT scan .    Chief complaint is abnormal CT scan:  His pancreas has been abnormal at least as far back as February 2016 when he had a CT scan. This showed "cystic lesion within the uncinate pancreas".  A follow-up PET CT scan February 2016 showed that the cystic area of the pancreas was not hypermetabolic.   He has Stage IV lung cancer and stage IV prostate cancer:  Gets imaging tests periodically to follow the above cancers.  He gets around with a walker or a wheelchair only. He is on nasal cannula oxygen for about 2 years. He is clearly fatigued. His brother-in-law's with him and provides much of the history.  He has no significant abdominal pains. He does have back discomfort. He is probably lost 10 pounds in the past few months.   Old Data Reviewed:  CT scan 12/2016 abd, pelvis without IV contrast: Pancreas: Chronic calcific pancreatitis again identified. Pancreatic duct mildly to moderately dilated at 7 mm in the head on image 68/series 2. Compare 4 mm on the prior exam. Partially calcified cystic area within the pancreatic head/ uncinate process is similar at on the order of 1.8 cm on image 73/series 2. No evidence of acute pancreatitis.  Labs 02/2017: Cr 2.7, T bili 0.3, alok phos 177, ast and alt normal; plts normal, hb 11.2     Review of systems: Pertinent positive and negative review of systems were noted in the above HPI section. All other review negative.   Past Medical History:  Diagnosis Date  . Chronic fatigue 04/12/2016  . Chronic pain 04/12/2016  . Chronic renal disease, stage III   . Metastatic cancer [facility])     Past Surgical History:  Procedure Laterality Date  . APPENDECTOMY    . VIDEO BRONCHOSCOPY N/A 09/21/2014   Procedure: VIDEO BRONCHOSCOPY WITH FLUORO;  Surgeon: Rigoberto Noel, MD;  Location: [facility];  Service: Cardiopulmonary;   Laterality: N/A;  . VIDEO BRONCHOSCOPY WITH ENDOBRONCHIAL ULTRASOUND N/A 09/23/2014   Procedure: VIDEO BRONCHOSCOPY WITH ENDOBRONCHIAL ULTRASOUND;  Surgeon: Rigoberto Noel, MD;  Location: [place] [facility];  Service: Pulmonary;  Laterality: N/A;    Current Outpatient Prescriptions  Medication Sig Dispense Refill  . acetaminophen (TYLENOL) 325 MG tablet Take 2 tablets (650 mg total) by mouth every 6 (six) hours as needed for mild pain (or Fever >/= 101).    Marland Kitchen albuterol (PROVENTIL) (2.5 MG/3ML) 0.083% nebulizer solution Take 3 mLs (2.5 mg total) by nebulization every 2 (two) hours as needed for wheezing. 75 mL 12  . AMITIZA 24 MCG capsule Take 24 mcg by mouth 2 (two) times daily with a meal.     . bicalutamide (CASODEX) 50 MG tablet Take 50 mg by mouth daily.     . diphenhydrAMINE (BENADRYL) 25 MG tablet Take 25 mg by mouth at bedtime as needed for itching.     . feeding supplement, ENSURE ENLIVE, (ENSURE ENLIVE) LIQD Take 237 mLs by mouth 2 (two) times daily between meals. 329 mL 12  . folic acid (FOLVITE) 1 MG tablet Take 1 mg by mouth daily.    . furosemide (LASIX) 20 MG tablet Take 1 tablet (20 mg total) by mouth daily as needed for fluid or edema. 30 tablet 0  . lidocaine-prilocaine (EMLA) cream Apply 1 application topically as needed. Apply to port site prior to chemotherapy. [place]  g 0  . magnesium oxide (MAG-OX) 400 MG tablet Take 400 mg by mouth daily.    . metoprolol tartrate (LOPRESSOR) 25 MG tablet Take 0.5 tablets (12.5 mg total) by mouth 2 (two) times daily. 60 tablet 0  . mirtazapine (REMERON) 15 MG tablet Take 1 tablet (15 mg total) by mouth at bedtime.    Marland Kitchen oxyCODONE-acetaminophen (PERCOCET/ROXICET) 5-325 MG tablet Take 1 tablet by mouth every 4 (four) hours as needed for severe pain. 60 tablet 0  . polyethylene glycol (MIRALAX / GLYCOLAX) packet Take 17 g by mouth daily as needed for mild constipation.     . prochlorperazine (COMPAZINE) 10 MG tablet Take 1 tablet (10 mg total) by mouth every 6  (six) hours as needed for nausea or vomiting. 30 tablet 1  . senna-docusate (SENOKOT-S) 8.6-50 MG per tablet Take 1 tablet by mouth daily.    . sodium bicarbonate 650 MG tablet Take 1 tablet (650 mg total) by mouth daily. 30 tablet 0  . SUMAtriptan (IMITREX) 25 MG tablet Take 25 mg by mouth every 2 (two) hours as needed for migraine.     . tamsulosin (FLOMAX) 0.4 MG CAPS capsule Take 0.4 mg by mouth daily.     . valACYclovir (VALTREX) 1000 MG tablet Take 1,000 mg by mouth 2 (two) times daily.    [name] 40 MG capsule Take 160 mg by mouth daily.     Marland Kitchen levocetirizine (XYZAL) 5 MG tablet Take 5 mg by mouth.     No current facility-administered medications for this visit.     Allergies as of 03/18/2017  . (No Known Allergies)    Family History  Problem Relation Age of Onset  . Lung cancer Brother     Social History   Social History  . Marital status: Single    Spouse name: N/A  . Number of children: N/A  . Years of education: N/A   Occupational History  . Not on file.   Social History Main Topics  . Smoking status: Former Smoker    Packs/day: 1.00    Types: Cigarettes    Quit date: 06/20/2014  . Smokeless tobacco: Never Used  . Alcohol use 3.6 oz/week    6 Cans of beer per week  . Drug use: Yes    Types: Marijuana  . Sexual activity: Not Currently   Other Topics Concern  . Not on file   Social History Narrative  . No narrative on file     Physical Exam: BP (!) 82/62   Pulse (!) 125   Ht 5\' 9"  (1.753 m)   Wt 170 lb (77.1 kg)   BMI 25.10 kg/m  Constitutional: Chronically ill-appearing with nasal cannula oxygen in place, sitting near his walker. He is clearly fatigued Psychiatric: alert and oriented x3 Eyes: extraocular movements intact Mouth: oral pharynx moist, no lesions Neck: supple no lymphadenopathy Cardiovascular: heart regular rate and rhythm Lungs: clear to auscultation bilaterally Abdomen: soft, nontender, nondistended, no obvious ascites, no  peritoneal signs, normal bowel sounds Extremities: no lower extremity edema bilaterally Skin: no lesions on visible extremities   Assessment and plan: 56 y.o. male with  metastatic lung cancer, metastatic prostate cancer, chronically abnormal pancreas with increased size of pancreatic duct on recent imaging 2 months ago.  His pancreas has been of normal for at least 2-1/2 years now. PET scan early 2016 showed that the cystic area of his pancreas was not hypermetabolic. I doubt he has underlying pancreatic adenocarcinoma given the chronicity of  his CT findings. He was a heavy alcohol drinker many years ago perhaps has some chronic scarring of his pancreas. We discussed possible endoscopic ultrasound of the pancreas with fine-needle aspiration if any obvious lesions are noted. We also discussed repeating PET scan. The other option is to simply observe him clinically. He has 2 metastatic cancers and is in chronically very poor health because of it. I'm going to contact his oncologist here in [place] to discuss these options. His brother-in-law and he seem to prefer invasive testing, biopsy.    Please see the "Patient Instructions" section for addition details about the plan.   Owens Loffler, MD [facility] 03/18/2017, 8:09 AM  Cc: Curt Bears, MD

## 2017-03-18 NOTE — Patient Instructions (Addendum)
We will contact Dr. Julien Nordmann about EUS vs. PET scan vs. Observation and will get back in touch with you with the plan.

## 2017-03-20 ENCOUNTER — Telehealth: Payer: Self-pay

## 2017-03-20 NOTE — Telephone Encounter (Signed)
-----  Message from Francisco Banister, MD sent at 03/20/2017  8:09 AM EDT ----- OK, thanks  Patty, He needs upper EUS radial +/- linear with MAC sedation, next available, for abnormal pancreas.  Thanks  ----- Message ----- From: Francisco Bears, MD Sent: 03/20/2017   8:06 AM To: Francisco Banister, MD  Francisco Love, Thank you for seeing him. Please proceed with FNA if possible for assurance. Thank you. Francisco Love ----- Message ----- From: Francisco Banister, MD Sent: 03/18/2017   8:33 AM To: Francisco Bears, MD  Francisco Love, I met Francisco Love and his brother-in-law today. We discussed his abnormal pancreas. Given the chronicity of the imaging abnormality (dating at least back to early 2016 when the pancreatic cystic lesions were found, they were PET negative), I think it is unlikely he has anything neoplastic his pancreas. He has 2 metastatic cancers, he is on oxygen, can only walk with a walker. I tried to steer him towards observation only or perhaps a PET scan but he and his brother-in-law seemed to prefer invasive testing with endoscopic ultrasound and fine-needle aspiration.   I wanted to get your take on this. I'm happy to go ahead with EUS with FNA if you think it's appropriate.  Thanks Francisco Love

## 2017-03-21 ENCOUNTER — Other Ambulatory Visit: Payer: Self-pay

## 2017-03-21 DIAGNOSIS — R948 Abnormal results of function studies of other organs and systems: Secondary | ICD-10-CM

## 2017-03-22 NOTE — Telephone Encounter (Signed)
Mailbox is full instructions mailed to the pt home. Home phone is also not currently in service.  Will try later

## 2017-03-25 NOTE — Telephone Encounter (Signed)
I have tried to reach the pt a number of times and all the phone numbers are either disconnected or the mailbox is full.  Instructions have been mailed to the home.

## 2017-03-26 ENCOUNTER — Ambulatory Visit
Admission: RE | Admit: 2017-03-26 | Discharge: 2017-04-03 | Disposition: A | Discharge status: Home / Self Care | Payer: MEDICARE

## 2017-03-26 ENCOUNTER — Ambulatory Visit
Admission: RE | Admit: 2017-03-26 | Discharge: 2017-04-03 | Disposition: A | Discharge status: Home / Self Care | Payer: MEDICARE | Attending: Hematology & Oncology | Admitting: Hematology & Oncology

## 2017-03-26 ENCOUNTER — Other Ambulatory Visit: Payer: Self-pay | Admitting: Oncology

## 2017-03-26 DIAGNOSIS — C7951 Secondary malignant neoplasm of bone: Secondary | ICD-10-CM

## 2017-03-26 DIAGNOSIS — C61 Malignant neoplasm of prostate: Principal | ICD-10-CM

## 2017-03-26 LAB — CBC W/ AUTO DIFF
BASOPHILS ABSOLUTE COUNT: 0.1 10*9/L (ref 0.0–0.1)
EOSINOPHILS ABSOLUTE COUNT: 0.8 10*9/L — ABNORMAL HIGH (ref 0.0–0.4)
HEMATOCRIT: 38.5 % — ABNORMAL LOW (ref 41.0–53.0)
HEMOGLOBIN: 12.5 g/dL — ABNORMAL LOW (ref 13.5–17.5)
LARGE UNSTAINED CELLS: 2 % (ref 0–4)
LYMPHOCYTES ABSOLUTE COUNT: 1.9 10*9/L (ref 1.5–5.0)
MEAN CORPUSCULAR HEMOGLOBIN CONC: 32.5 g/dL (ref 31.0–37.0)
MEAN CORPUSCULAR HEMOGLOBIN: 31.1 pg (ref 26.0–34.0)
MEAN CORPUSCULAR VOLUME: 95.5 fL (ref 80.0–100.0)
MEAN PLATELET VOLUME: 7.8 fL (ref 7.0–10.0)
MONOCYTES ABSOLUTE COUNT: 1.1 10*9/L — ABNORMAL HIGH (ref 0.2–0.8)
NEUTROPHILS ABSOLUTE COUNT: 7.3 10*9/L (ref 2.0–7.5)
RED BLOOD CELL COUNT: 4.03 10*12/L — ABNORMAL LOW (ref 4.50–5.90)
RED CELL DISTRIBUTION WIDTH: 19.7 % — ABNORMAL HIGH (ref 12.0–15.0)
WBC ADJUSTED: 11.4 10*9/L — ABNORMAL HIGH (ref 4.5–11.0)

## 2017-03-26 LAB — PROSTATE SPECIFIC ANTIGEN: Prostate specific Ag:MCnc:Pt:Ser/Plas:Qn:: 50.7 — ABNORMAL HIGH

## 2017-03-26 LAB — EGFR MDRD NON AF AMER
Glomerular filtration rate/1.73 sq M.predicted.non black:ArVRat:Pt:Ser/Plas/Bld:Qn:Creatinine-based formula (MDRD): 26 — ABNORMAL LOW

## 2017-03-26 LAB — CREATININE: EGFR MDRD AF AMER: 32 mL/min/{1.73_m2} — ABNORMAL LOW (ref >=60)

## 2017-03-26 LAB — MEAN PLATELET VOLUME: Lab: 7.8

## 2017-03-26 MED ORDER — OXYCODONE-ACETAMINOPHEN 5 MG-325 MG TABLET
ORAL_TABLET | ORAL | 0 refills | 0 days | Status: CP | PRN
Start: 2017-03-26 — End: 2017-04-23

## 2017-03-26 NOTE — Unmapped (Signed)
Appointments for MRI's

## 2017-03-26 NOTE — Unmapped (Addendum)
Today, you will get Radium 223 infusion #2  Also, we'll arrange for MRI of spine to be done in [place].  After that, we can decide whether you need radiation or not.    Lab Results   Component Value Date    PSA 67.60 (H) 02/21/2017    PSA 60.80 (H) 01/10/2017    PSA 28.10 (H) 10/11/2016    PSA 24.90 (H) 08/30/2016    PSA 32.60 (H) 07/19/2016    PSA 21.00 (H) 06/21/2016           Lab on 03/26/2017   Component Date Value Ref Range Status   ??? Creatinine 03/26/2017 2.53* 0.70 - 1.30 mg/dL Final   ??? EGFR MDRD Af Amer 03/26/2017 32* >=60 mL/min/1.21m2 Final   ??? EGFR MDRD Non Af Amer 03/26/2017 26* >=60 mL/min/1.64m2 Final   ??? WBC 03/26/2017 11.4* 4.5 - 11.0 10*9/L Final   ??? RBC 03/26/2017 4.03* 4.50 - 5.90 10*12/L Final   ??? HGB 03/26/2017 12.5* 13.5 - 17.5 g/dL Final   ??? HCT 63/08/6008 38.5* 41.0 - 53.0 % Final   ??? MCV 03/26/2017 95.5  80.0 - 100.0 fL Final   ??? MCH 03/26/2017 31.1  26.0 - 34.0 pg Final   ??? MCHC 03/26/2017 32.5  31.0 - 37.0 g/dL Final   ??? RDW 93/23/5573 19.7* 12.0 - 15.0 % Final   ??? MPV 03/26/2017 7.8  7.0 - 10.0 fL Final   ??? Platelet 03/26/2017 359  150 - 440 10*9/L Final   ??? Absolute Neutrophils 03/26/2017 7.3  2.0 - 7.5 10*9/L Final   ??? Absolute Lymphocytes 03/26/2017 1.9  1.5 - 5.0 10*9/L Final   ??? Absolute Monocytes 03/26/2017 1.1* 0.2 - 0.8 10*9/L Final   ??? Absolute Eosinophils 03/26/2017 0.8* 0.0 - 0.4 10*9/L Final   ??? Absolute Basophils 03/26/2017 0.1  0.0 - 0.1 10*9/L Final   ??? Large Unstained Cells 03/26/2017 2  0 - 4 % Final   ??? Microcytosis 03/26/2017 Slight* Not Present Final   ??? Macrocytosis 03/26/2017 Moderate* Not Present Final   ??? Anisocytosis 03/26/2017 Moderate* Not Present Final       Please call [phone] to reach my nurse navigator Mauricia Area for any issues.    For emergencies on Nights, Weekends and Holidays  Call [phone] and ask for the hematology/oncology on call.    Griffin Basil, MD, PhD  Associate Professor of Medicine  Division of Hematology-Oncology    [facility]  Genitourinary Oncology Clinic  Nurse Navigator: Mauricia Area  Fax: [phone]

## 2017-03-26 NOTE — Unmapped (Signed)
-----   Message from Ava R Pettiford sent at 03/26/2017 10:44 AM EDT -----  Patient scheduled 8/13 @ 3:30 for MRI's at [facility]) and patient's brother in law has been informed.      Ava Pettiford  ----- Message -----  From: Marica Otter, RN  Sent: 03/26/2017  10:14 AM  To: Ava R Pettiford    [place].  J  ----- Message -----  From: Williemae Area Pettiford  Sent: 03/26/2017   9:30 AM  To: Marica Otter, RN    Boneta Lucks,    Can you ask patient if he wants [facility] in [place] or [place]?      Ava Pettiford  ----- Message -----  From: Marica Otter, RN  Sent: 03/26/2017   9:18 AM  To: Ava R Pettiford    Hi- can you please schedule pt at [facility] for MRI Thoracic and Lumbar. Orders are in.    Thank you,  J

## 2017-03-26 NOTE — Unmapped (Signed)
5-325 mg percocet administered per MD order

## 2017-03-27 ENCOUNTER — Other Ambulatory Visit: Payer: Self-pay | Admitting: Internal Medicine

## 2017-03-27 ENCOUNTER — Encounter (HOSPITAL_COMMUNITY): Payer: Self-pay

## 2017-03-27 ENCOUNTER — Ambulatory Visit (HOSPITAL_COMMUNITY): Payer: Medicare Other

## 2017-03-27 ENCOUNTER — Ambulatory Visit: Payer: Medicare Other

## 2017-03-27 ENCOUNTER — Other Ambulatory Visit (HOSPITAL_BASED_OUTPATIENT_CLINIC_OR_DEPARTMENT_OTHER): Payer: Medicare Other

## 2017-03-27 ENCOUNTER — Encounter: Payer: Self-pay | Admitting: Nurse Practitioner

## 2017-03-27 ENCOUNTER — Ambulatory Visit (HOSPITAL_BASED_OUTPATIENT_CLINIC_OR_DEPARTMENT_OTHER): Payer: Medicare Other | Admitting: Nurse Practitioner

## 2017-03-27 ENCOUNTER — Ambulatory Visit (HOSPITAL_COMMUNITY)
Admission: RE | Admit: 2017-03-27 | Discharge: 2017-03-27 | Disposition: A | Discharge status: Home / Self Care | Payer: Medicare Other | Source: Ambulatory Visit | Attending: Internal Medicine | Admitting: Internal Medicine

## 2017-03-27 DIAGNOSIS — C3491 Malignant neoplasm of unspecified part of right bronchus or lung: Secondary | ICD-10-CM

## 2017-03-27 DIAGNOSIS — Q648 Other specified congenital malformations of urinary system: Secondary | ICD-10-CM | POA: Diagnosis not present

## 2017-03-27 DIAGNOSIS — C801 Malignant (primary) neoplasm, unspecified: Principal | ICD-10-CM

## 2017-03-27 DIAGNOSIS — G893 Neoplasm related pain (acute) (chronic): Secondary | ICD-10-CM

## 2017-03-27 DIAGNOSIS — R5383 Other fatigue: Secondary | ICD-10-CM | POA: Diagnosis not present

## 2017-03-27 DIAGNOSIS — R590 Localized enlarged lymph nodes: Secondary | ICD-10-CM | POA: Insufficient documentation

## 2017-03-27 DIAGNOSIS — J438 Other emphysema: Secondary | ICD-10-CM | POA: Diagnosis not present

## 2017-03-27 DIAGNOSIS — C7951 Secondary malignant neoplasm of bone: Secondary | ICD-10-CM

## 2017-03-27 DIAGNOSIS — M87821 Other osteonecrosis, right humerus: Secondary | ICD-10-CM | POA: Insufficient documentation

## 2017-03-27 DIAGNOSIS — K8689 Other specified diseases of pancreas: Secondary | ICD-10-CM | POA: Diagnosis not present

## 2017-03-27 DIAGNOSIS — M12852 Other specific arthropathies, not elsewhere classified, left hip: Secondary | ICD-10-CM | POA: Insufficient documentation

## 2017-03-27 DIAGNOSIS — C3401 Malignant neoplasm of right main bronchus: Secondary | ICD-10-CM | POA: Diagnosis present

## 2017-03-27 DIAGNOSIS — I700 Atherosclerosis of aorta: Secondary | ICD-10-CM | POA: Insufficient documentation

## 2017-03-27 DIAGNOSIS — R5382 Chronic fatigue, unspecified: Secondary | ICD-10-CM

## 2017-03-27 DIAGNOSIS — R197 Diarrhea, unspecified: Secondary | ICD-10-CM

## 2017-03-27 DIAGNOSIS — C7889 Secondary malignant neoplasm of other digestive organs: Secondary | ICD-10-CM | POA: Diagnosis not present

## 2017-03-27 DIAGNOSIS — Z5112 Encounter for antineoplastic immunotherapy: Secondary | ICD-10-CM | POA: Diagnosis not present

## 2017-03-27 DIAGNOSIS — M12851 Other specific arthropathies, not elsewhere classified, right hip: Secondary | ICD-10-CM | POA: Diagnosis not present

## 2017-03-27 DIAGNOSIS — G8929 Other chronic pain: Secondary | ICD-10-CM

## 2017-03-27 DIAGNOSIS — C61 Malignant neoplasm of prostate: Secondary | ICD-10-CM

## 2017-03-27 DIAGNOSIS — M87822 Other osteonecrosis, left humerus: Secondary | ICD-10-CM | POA: Insufficient documentation

## 2017-03-27 DIAGNOSIS — J432 Centrilobular emphysema: Secondary | ICD-10-CM | POA: Insufficient documentation

## 2017-03-27 DIAGNOSIS — Z7189 Other specified counseling: Secondary | ICD-10-CM

## 2017-03-27 DIAGNOSIS — I951 Orthostatic hypotension: Secondary | ICD-10-CM | POA: Insufficient documentation

## 2017-03-27 DIAGNOSIS — C787 Secondary malignant neoplasm of liver and intrahepatic bile duct: Secondary | ICD-10-CM

## 2017-03-27 LAB — CBC WITH DIFFERENTIAL/PLATELET
BASO%: 0.3 % (ref 0.0–2.0)
Basophils Absolute: 0 10*3/uL (ref 0.0–0.1)
EOS%: 9.9 % — AB (ref 0.0–7.0)
Eosinophils Absolute: 1.2 10*3/uL — ABNORMAL HIGH (ref 0.0–0.5)
HCT: 36.1 % — ABNORMAL LOW (ref 38.4–49.9)
HGB: 11.9 g/dL — ABNORMAL LOW (ref 13.0–17.1)
LYMPH%: 17.9 % (ref 14.0–49.0)
MCH: 31.2 pg (ref 27.2–33.4)
MCHC: 33 g/dL (ref 32.0–36.0)
MCV: 94.5 fL (ref 79.3–98.0)
MONO#: 1.1 10*3/uL — ABNORMAL HIGH (ref 0.1–0.9)
MONO%: 9.2 % (ref 0.0–14.0)
NEUT%: 62.7 % (ref 39.0–75.0)
NEUTROS ABS: 7.6 10*3/uL — AB (ref 1.5–6.5)
Platelets: 332 10*3/uL (ref 140–400)
RBC: 3.82 10*6/uL — ABNORMAL LOW (ref 4.20–5.82)
RDW: 19.2 % — ABNORMAL HIGH (ref 11.0–14.6)
WBC: 12.2 10*3/uL — AB (ref 4.0–10.3)
lymph#: 2.2 10*3/uL (ref 0.9–3.3)
nRBC: 0 % (ref 0–0)

## 2017-03-27 LAB — COMPREHENSIVE METABOLIC PANEL
ALBUMIN: 2.7 g/dL — AB (ref 3.5–5.0)
ALK PHOS: 258 U/L — AB (ref 40–150)
ALT: 9 U/L (ref 0–55)
AST: 16 U/L (ref 5–34)
Anion Gap: 13 mEq/L — ABNORMAL HIGH (ref 3–11)
BILIRUBIN TOTAL: 0.3 mg/dL (ref 0.20–1.20)
BUN: 25.9 mg/dL (ref 7.0–26.0)
CALCIUM: 9.9 mg/dL (ref 8.4–10.4)
CO2: 16 mEq/L — ABNORMAL LOW (ref 22–29)
CREATININE: 2.5 mg/dL — AB (ref 0.7–1.3)
Chloride: 107 mEq/L (ref 98–109)
EGFR: 32 mL/min/{1.73_m2} — ABNORMAL LOW (ref >90)
GLUCOSE: 94 mg/dL (ref 70–140)
Potassium: 4.7 mEq/L (ref 3.5–5.1)
SODIUM: 136 meq/L (ref 136–145)
TOTAL PROTEIN: 7.4 g/dL (ref 6.4–8.3)

## 2017-03-27 MED ORDER — OXYCODONE-ACETAMINOPHEN 5-325 MG PO TABS: 1.0000 | ORAL_TABLET | ORAL | 0 refills | Status: DC | PRN

## 2017-03-27 NOTE — Progress Notes (Signed)
Patient on plan of care prior to pathways. 

## 2017-03-27 NOTE — Progress Notes (Addendum)
[place] OFFICE PROGRESS NOTE   DIAGNOSIS:  1) Stage IV (T2b, N2, M1b) non-small cell lung cancer, adenocarcinoma with negative EGFR mutation and negative gene translocation presented with a large right hilar mass as well as mediastinal lymphadenopathy and metastatic disease to the liver, bone and pancreas diagnosed in February 2016. 2) prostate adenocarcinoma diagnosed at [facility] with Gleason score 9 (4+5). 3) treatment with Radium 223 for prostate cancer at [facility] on 02/21/2017  PRIOR THERAPY:  1) Systemic chemotherapy with carboplatin for AUC of 5 and Alimta 500 MG/M2 every 3 weeks. Status post 6 cycles. 2) Enzalutamide for prostate cancer at [place]:  Immunotherapy with Nivolumab 240 MG every 2 weeks, status post 48 cycles  INTERVAL HISTORY:   Mr. Settlemire returns as scheduled. He completed cycle 48 nivolumab 03/13/2017. He reports onset of diarrhea 4-5 days ago. He estimates greater than 10 stools a day. He notes stools are "dark". He denies abdominal pain. For the past week he alternates feeling hot or cold. No nausea. Appetite is poor. He is breathing "hard". No chest pain. No rash. He reports low back pain. He states he is out of his pain medication.  Objective:  Vital signs in last 24 hours:  Blood pressure 113/85, pulse (!) 115, temperature 97.8 F (36.6 C), temperature source Oral, resp. rate 20, height _0  (1.753 m), weight 170 lb (77.1 kg), SpO2 98 %.    HEENT: No thrush or ulcers. Resp: Lungs clear bilaterally. Cardio: Regular, tachycardic. GI: Abdomen is soft, mildly distended. Nontender. Vascular: No leg edema. Neuro: Alert and oriented.    Lab Results:  Lab Results  Component Value Date   WBC 12.2 (H) 03/27/2017   HGB 11.9 (L) 03/27/2017   HCT 36.1 (L) 03/27/2017   MCV 94.5 03/27/2017   PLT 332 03/27/2017   NEUTROABS 7.6 (H) 03/27/2017    Imaging:  No results found.  Medications: I have  reviewed the patient's current medications.  Assessment/Plan: 1. Metastatic non-small cell lung cancer, adenocarcinomacurrently on active treatment with nivolumab status post 48 cycles. Restaging CTs 03/27/2017 with stable disease. 2. Prostate cancer followed at [facility]. On Enzalutamide. Radium-223 02/21/2017, 03/26/2017. 3. Pain management. He takes Percocet as needed. 4. Diarrhea.   Disposition: Mr. Fermin has completed 48 cycles of nivolumab. The restaging CT scans from earlier today shows stable disease. Dr. Julien Nordmann recommends continuation of nivolumab but will change the dosing schedule from every 2 weeks to every 4 weeks. Treatment will be held today. He will return as scheduled in 2 weeks.  He is having diarrhea. This may be related to the radium 223 infusions. He will contact his provider at [facility] to discuss management of the diarrhea.  He was provided with a new Percocet prescription at today's visit.  Patient seen with Dr. Julien Nordmann.     Ned Card ANP/GNP-BC   03/27/2017  10:21 AM  ADDENDUM: Hematology/Oncology Attending: I had a face to face encounter with the patient today. I recommended his care plan. This is a very pleasant 56 years old African-American male with metastatic non-small cell lung cancer, adenocarcinoma currently undergoing treatment with Nivolumab status post 48 cycles and has been tolerating this treatment fairly well. The patient also has a history of metastatic adenocarcinoma of the prostate and he recently was treated at [facility] with radium 223 infusion. Over the last week the patient has been complaining of increasing diarrhea several times a day. This  probably secondary to combination of the current immunotherapy in addition to the adverse effects from radium 223. The patient had repeat CT scan of the chest, abdomen and pelvis performed recently that showed stable disease with no concerning finding for progression. I discussed the  scan results with the patient and his brother-in-law. I recommended for him to continue his current treatment with Nivolumab but I will hold the upcoming cycle of his treatment for 2 weeks until improvement of his diarrhea. I will also change his treatment regimen to Nivolumab for 480 mg every 4 weeks to make it more convenient for the patient who travel long distance to come to the [place] every 2 weeks. The patient agreed to the current plan. He would come back for follow-up visit at that time. He was advised to call immediately if he has any concerning symptoms in the interval.  Disclaimer: This note was dictated with voice recognition software. Similar sounding words can inadvertently be transcribed and may be missed upon review. Eilleen Kempf., MD 03/27/17

## 2017-03-27 NOTE — Progress Notes (Signed)
START ON PATHWAY REGIMEN - Non-Small Cell Lung     A cycle is every 28 days:     Nivolumab   **Always confirm dose/schedule in your pharmacy ordering system**    Patient Characteristics: Stage IV Metastatic, Non Squamous, Second Line - Chemotherapy/Immunotherapy, PS = 0, 1, No Prior PD-1/PD-L1  Inhibitor and Immunotherapy Candidate AJCC T Category: T2b Current Disease Status: Distant Metastases AJCC N Category: N2 AJCC M Category: M1c AJCC 8 Stage Grouping: IVB Histology: Non Squamous Cell ROS1 Rearrangement Status: Negative T790M Mutation Status: Not Applicable - EGFR Mutation Negative/Unknown Other Mutations/Biomarkers: No Other Actionable Mutations PD-L1 Expression Status: Quantity Not Sufficient Chemotherapy/Immunotherapy LOT: Second Line Chemotherapy/Immunotherapy Molecular Targeted Therapy: Not Appropriate ALK Translocation Status: Negative Would you be surprised if this patient died  in the next year? I would NOT be surprised if this patient died in the next year EGFR Mutation Status: Negative/Wild Type BRAF [id number] Mutation Status: Negative Performance Status: PS = 0, 1 Immunotherapy Candidate Status: Candidate for Immunotherapy Prior Immunotherapy Status: No Prior PD-1/PD-L1 Inhibitor Intent of Therapy: Non-Curative / Palliative Intent, Discussed with Patient

## 2017-03-28 ENCOUNTER — Encounter (HOSPITAL_COMMUNITY): Payer: Self-pay | Admitting: Gastroenterology

## 2017-03-28 ENCOUNTER — Ambulatory Visit (HOSPITAL_COMMUNITY)
Admission: RE | Admit: 2017-03-28 | Discharge: 2017-03-28 | Disposition: A | Discharge status: Home / Self Care | Payer: Medicare Other | Source: Ambulatory Visit | Attending: Gastroenterology | Admitting: Gastroenterology

## 2017-03-28 ENCOUNTER — Ambulatory Visit (HOSPITAL_COMMUNITY): Payer: Medicare Other | Admitting: Anesthesiology

## 2017-03-28 ENCOUNTER — Encounter (HOSPITAL_COMMUNITY)
Admission: RE | Disposition: A | Discharge status: Home / Self Care | Payer: Self-pay | Source: Ambulatory Visit | Attending: Gastroenterology

## 2017-03-28 DIAGNOSIS — Z9981 Dependence on supplemental oxygen: Secondary | ICD-10-CM | POA: Diagnosis not present

## 2017-03-28 DIAGNOSIS — K8689 Other specified diseases of pancreas: Secondary | ICD-10-CM | POA: Insufficient documentation

## 2017-03-28 DIAGNOSIS — R948 Abnormal results of function studies of other organs and systems: Secondary | ICD-10-CM | POA: Diagnosis present

## 2017-03-28 DIAGNOSIS — R933 Abnormal findings on diagnostic imaging of other parts of digestive tract: Secondary | ICD-10-CM | POA: Diagnosis not present

## 2017-03-28 DIAGNOSIS — C7800 Secondary malignant neoplasm of unspecified lung: Secondary | ICD-10-CM | POA: Diagnosis not present

## 2017-03-28 DIAGNOSIS — Z79899 Other long term (current) drug therapy: Secondary | ICD-10-CM | POA: Insufficient documentation

## 2017-03-28 DIAGNOSIS — N183 Chronic kidney disease, stage 3 (moderate): Secondary | ICD-10-CM | POA: Insufficient documentation

## 2017-03-28 DIAGNOSIS — C7982 Secondary malignant neoplasm of genital organs: Secondary | ICD-10-CM | POA: Insufficient documentation

## 2017-03-28 DIAGNOSIS — Z87891 Personal history of nicotine dependence: Secondary | ICD-10-CM | POA: Insufficient documentation

## 2017-03-28 DIAGNOSIS — C801 Malignant (primary) neoplasm, unspecified: Secondary | ICD-10-CM | POA: Diagnosis not present

## 2017-03-28 DIAGNOSIS — K862 Cyst of pancreas: Secondary | ICD-10-CM | POA: Diagnosis not present

## 2017-03-28 HISTORY — PX: EUS: SHX5427

## 2017-03-28 SURGERY — UPPER ENDOSCOPIC ULTRASOUND (EUS) RADIAL
Anesthesia: Monitor Anesthesia Care

## 2017-03-28 MED ORDER — ONDANSETRON HCL 4 MG/2ML IJ SOLN
INTRAMUSCULAR | Status: AC
  Filled 2017-03-28: qty 2

## 2017-03-28 MED ORDER — CIPROFLOXACIN IN D5W 400 MG/200ML IV SOLN
400.0000 mg | Freq: Once | INTRAVENOUS | Status: AC
  Administered 2017-03-28: 400 mg via INTRAVENOUS

## 2017-03-28 MED ORDER — PROPOFOL 10 MG/ML IV BOLUS
INTRAVENOUS | Status: AC
  Filled 2017-03-28: qty 40

## 2017-03-28 MED ORDER — HEPARIN SOD (PORK) LOCK FLUSH 100 UNIT/ML IV SOLN
INTRAVENOUS | Status: AC
  Filled 2017-03-28: qty 5

## 2017-03-28 MED ORDER — CIPROFLOXACIN IN D5W 400 MG/200ML IV SOLN
INTRAVENOUS | Status: AC
  Filled 2017-03-28: qty 200

## 2017-03-28 MED ORDER — CIPROFLOXACIN HCL 500 MG PO TABS: 500.0000 mg | ORAL_TABLET | Freq: Two times a day (BID) | ORAL | 0 refills | Status: DC

## 2017-03-28 MED ORDER — PHENYLEPHRINE 40 MCG/ML (10ML) SYRINGE FOR IV PUSH (FOR BLOOD PRESSURE SUPPORT)
PREFILLED_SYRINGE | INTRAVENOUS | Status: AC
  Filled 2017-03-28: qty 10

## 2017-03-28 MED ORDER — ONDANSETRON HCL 4 MG/2ML IJ SOLN
INTRAMUSCULAR | Status: DC | PRN
  Administered 2017-03-28: 4 mg via INTRAVENOUS

## 2017-03-28 MED ORDER — SODIUM CHLORIDE 0.9 % IV SOLN
INTRAVENOUS | Status: DC
  Administered 2017-03-28: 12:00:00 via INTRAVENOUS

## 2017-03-28 MED ORDER — SUCCINYLCHOLINE CHLORIDE 200 MG/10ML IV SOSY
PREFILLED_SYRINGE | INTRAVENOUS | Status: AC
  Filled 2017-03-28: qty 10

## 2017-03-28 MED ORDER — EPHEDRINE SULFATE-NACL 50-0.9 MG/10ML-% IV SOSY
PREFILLED_SYRINGE | INTRAVENOUS | Status: DC | PRN
  Administered 2017-03-28 (×2): 5 mg via INTRAVENOUS

## 2017-03-28 MED ORDER — PHENYLEPHRINE 40 MCG/ML (10ML) SYRINGE FOR IV PUSH (FOR BLOOD PRESSURE SUPPORT)
PREFILLED_SYRINGE | INTRAVENOUS | Status: DC | PRN
  Administered 2017-03-28 (×3): 80 ug via INTRAVENOUS
  Administered 2017-03-28: 200 ug via INTRAVENOUS
  Administered 2017-03-28 (×2): 80 ug via INTRAVENOUS
  Administered 2017-03-28: 200 ug via INTRAVENOUS

## 2017-03-28 MED ORDER — DEXAMETHASONE SODIUM PHOSPHATE 10 MG/ML IJ SOLN
INTRAMUSCULAR | Status: DC | PRN
  Administered 2017-03-28: 5 mg via INTRAVENOUS

## 2017-03-28 MED ORDER — PROPOFOL 500 MG/50ML IV EMUL
INTRAVENOUS | Status: DC | PRN
  Administered 2017-03-28: 100 ug/kg/min via INTRAVENOUS

## 2017-03-28 MED ORDER — DEXAMETHASONE SODIUM PHOSPHATE 10 MG/ML IJ SOLN
INTRAMUSCULAR | Status: AC
  Filled 2017-03-28: qty 1

## 2017-03-28 MED ORDER — LIDOCAINE 2% (20 MG/ML) 5 ML SYRINGE
INTRAMUSCULAR | Status: AC
  Filled 2017-03-28: qty 5

## 2017-03-28 MED ORDER — HEPARIN SOD (PORK) LOCK FLUSH 100 UNIT/ML IV SOLN
500.0000 [IU] | Freq: Once | INTRAVENOUS | Status: AC
  Administered 2017-03-28: 500 [IU] via INTRAVENOUS

## 2017-03-28 MED ORDER — PROPOFOL 10 MG/ML IV BOLUS
INTRAVENOUS | Status: DC | PRN
  Administered 2017-03-28 (×3): 20 mg via INTRAVENOUS

## 2017-03-28 NOTE — Unmapped (Signed)
[facility] Visit Note    Patient Name: Bruce Parker  Patient Age: 56 y.o.  Encounter Date: 03/26/2017  Provider: Maurie Boettcher  Referring physician: Lonie Peak Rindane, *    Assessment  Patient Active Problem List   Diagnosis   ??? Derangement of right patella   ??? Hypertension, benign   ??? Osteoarthrosis   ??? Chronic renal disease, stage 3, moderately decreased glomerular filtration rate (GFR) between 30-59 mL/min/1.73 square meter   ??? Lichen planus   ??? Weakness   ??? Lung mass   ??? Mediastinal lymphadenopathy   ??? Malignant neoplasm metastatic to bone (CMS-HCC)   ??? Pain of metastatic malignancy   ??? Non-small cell carcinoma of lung (CMS-HCC)   ??? Protein-calorie malnutrition (CMS-HCC)   ??? Acute respiratory failure (CMS-HCC)   ??? Shortness of breath   ??? Tobacco dependence syndrome   ??? Prostate cancer (CMS-HCC)     1. Metastatic castration resistant prostate cancer, with bone metastases  2. Stage IV nonsmall cell lung cancer, on secondline therapy with nivolumab s/p cycle #48 (q2w)    On firstline CRPC therapy with enzalutamide. PSA has increased from 28 to 60 indicating progression on enzalutamide.     We discussed treatment options going forward, which include cytotoxic chemotherapy with docetaxel or Radium223, which is what we would recommend. We discussed the benefit of Radium 223, which includes overall survival as well as symptom improvement for bone metastases. However, as it would have little effect on prostate cancer outside of the bone, we would recommend that he continue enzalutamide as well. Side effects of radium 223 include diarrhea and low blood counts, which he could be at higher risk for since he has previously received chemotherapy for his NSCLC. This treatment would also not affect his treatment for lung cancer. We would plan to give Radium 223 monthly for total of 6 treatments. After discussion patient consented to proceeding with Radium 223.    Bone scan done last week came back with one new lesion and this would constitute stable disease.  Today, he will proceed with Radium 223 infusion #2.  Pt reports worsening back pain.  While this could be due to pain flare from Radium 223 treatment, I'm also concerned about the possibility of bone mets/spinal cord compression.    Plan  1. Proceed with Radium-223 infusion #2 of 6 planned  2. Continue enzalutamide dose to 160 mg qd (regular dose), since he is tolerating well and bone scan is stable.  3. Lupron 22.5 mg IM, given last 01/10/2017. Next due 04/12/17 or later  4. For back pain, MRI of T and L spine ordered, to be done locally, primarily to look for spinal cord compression.  5. Continue Percocet prn for pain. Percocet given today and prescription given to pt today  6. Continue with stage 4 lung cancer treatment by his local oncologist Dr. Arbutus Ped. On nivolumab  7. Return in 4 weeks for next infusion of Radium 223.           Reason for Visit  Prostate cancer    History of Present Illness:  Oncology History    --In 04/2014, PSA elevated to 56.4. DRE normal.  Prostate bx scheduled, but canceled by pt.  --In 09/2014, diagnosed with nonsmall cell lung cancer, stage IV (T2b, N2, M1b) with a large R hilar mass, mediastinal adenopathy, mets to bones. No uptake in liver or pancreas on PET scan. Insufficient material for molecular testing.  Guardant360 for ctDNA was  negative.  Pt was subsequently treated with carboplatin/Alimta with initial good response.  Then disease progression.  On second line therapy with nivolumab.  --In 01/2015, PSA 62.2.  Seen in Urology clinic and referred to [facility] for possibility of prostate cancer.  After discussions about various options, underwent bone bx in 05/2015, nondiagnostic. PSA down to 23.7  --In 07/2015, prostate bx showed Gleason 4+5=9 adenocarcinoma.  --In 08/2015, androgen deprivation therapy with Lupron started.  PSA 354.  --In 11/2015, Lupron continued. PSA down to 1.7, which was the nadir. Subsequently, PSA began rising.  --In 06/2016, PSA up to 32.  Enzalutamide started.  MRI of brain neg. Bone scan stable.  --In 02/2017, PSA rise on enzalutamide. Bone scan stable. Radium 223 added        Lung cancer, primary, with metastasis from lung to other site (CMS-HCC) (Resolved)    10/11/2014 Initial Diagnosis     Lung cancer, primary, with metastasis from lung to other site [facility])           Prostate cancer (CMS-HCC)    07/27/2015 Initial Diagnosis     Prostate cancer (RAF-HCC)          The patient returns for scheduled follow up. Pt notes worsening back pain for past two weeks, but without bowel/bladder issues or ambulation problem.  He ran out of his pain medicine a few days ago and he is requesting something to take now.  He's also planning to have a procedure done (biopsy?) to pursue the work up of pancreatic mass that had been there for a while (2+ years).       Allergies:  No Known Allergies    Current Medications:    Current Outpatient Prescriptions:   ???  albuterol 2.5 mg /3 mL (0.083 %) nebulizer solution, Inhale 2.5 mg by nebulization every four (4) hours as needed. , Disp: , Rfl:   ???  AMITIZA 24 mcg capsule, Take 24 mcg by mouth daily with breakfast. , Disp: , Rfl:   ???  diphenhydrAMINE (BENADRYL) 25 mg tablet, Take 25 mg by mouth every six (6) hours as needed. , Disp: , Rfl:   ???  doxycycline (VIBRA-TABS) 100 MG tablet, Take 1 tablet (100 mg total) by mouth daily., Disp: 30 tablet, Rfl: 0  ???  doxycycline (VIBRA-TABS) 100 MG tablet, TAKE ONE TABLET BY MOUTH ONCE DAILY, Disp: 90 tablet, Rfl: 0  ???  enzalutamide (XTANDI) 40 mg cap capsule, Take 4 capsules (160 mg total) by mouth daily., Disp: 120 capsule, Rfl: 11  ???  fluocinolone 0.01 % external oil, Bid to back, Disp: 120 mL, Rfl: 2  ???  folic acid (FOLVITE) 1 MG tablet, Take 1 mg by mouth., Disp: , Rfl:   ???  food supplemt, lactose-reduced (ENSURE PLUS) 0.05-1.5 gram-kcal/mL liquid, Take 237 mL by mouth daily. , Disp: , Rfl:   ???  furosemide (LASIX) 20 MG tablet, Take 20 mg by mouth., Disp: , Rfl:   ???  lactose-reduced food (ENSURE ENLIVE ORAL), Take 237 mL by mouth., Disp: , Rfl:   ???  levocetirizine (XYZAL) 5 MG tablet, Take 5 mg by mouth., Disp: , Rfl:   ???  lidocaine-prilocaine (EMLA) cream, Place on port site the morning of chemo, Disp: , Rfl:   ???  magnesium oxide (MAG-OX) 400 mg tablet, Take 400 mg by mouth., Disp: , Rfl:   ???  metoprolol tartrate (LOPRESSOR) 25 MG tablet, Take 12.5 mg by mouth., Disp: , Rfl:   ???  mirtazapine (REMERON)  15 MG tablet, Take 15 mg by mouth nightly. , Disp: , Rfl:   ???  oxyCODONE-acetaminophen (PERCOCET) 5-325 mg per tablet, Take 1 tablet by mouth every four (4) hours as needed for pain., Disp: 100 tablet, Rfl: 0  ???  polyethylene glycol (MIRALAX) 17 gram packet, Take 17 g by mouth daily. , Disp: , Rfl:   ???  prochlorperazine (COMPAZINE) 10 MG tablet, Take 10 mg by mouth every eight (8) hours as needed. , Disp: , Rfl:   ???  senna-docusate (PERICOLACE) 8.6-50 mg, Take 1 tablet by mouth., Disp: , Rfl:   ???  sodium bicarbonate 650 mg tablet, Take 650 mg by mouth., Disp: , Rfl:   ???  SUMAtriptan (IMITREX) 25 MG tablet, , Disp: , Rfl:   ???  tamsulosin (FLOMAX) 0.4 mg capsule, Take 1 capsule (0.4 mg total) by mouth daily., Disp: 30 capsule, Rfl: 11  ???  triamcinolone (KENALOG) 0.1 % ointment, Apply to arms twice a day until clear, then stop. (Patient taking differently: Apply 1 application topically two (2) times a day as needed. ), Disp: 30 g, Rfl: 1  ???  valACYclovir (VALTREX) 1000 MG tablet, TAKE ONE TABLET BY MOUTH ONCE DAILY, Disp: 90 tablet, Rfl: 0  ???  valACYclovir (VALTREX) 1000 MG tablet, Take 1 tablet (1,000 mg total) by mouth daily., Disp: 30 tablet, Rfl: 0    Current Facility-Administered Medications:   ???  oxyCODONE-acetaminophen (PERCOCET) 5-325 mg tablet 1 tablet, 1 tablet, Oral, Q4H PRN, Maurie Boettcher, MD, 1 tablet at 03/26/17 1610    Past Medical History and Social History  Past Medical History:   Diagnosis Date   ??? Lung cancer (CMS-HCC)    ??? On supplemental oxygen therapy       Past Surgical History:   Procedure Laterality Date   ??? APPENDECTOMY     ??? PR BRNCHSC EBUS GUIDED SAMPL 3/> NODE STATION/STRUX N/A 05/18/2015    Procedure: Bronch, Rigid Or Flexible, Including Fluoro Guidance, When Performed; W Ebus Guided Transtracheal And/Or Transbronchial Sampling, 3 Or More Mediastinal And/Or Hilar Lymph Node Stations Or Structures;  Surgeon: Mathis Bud, MD;  Location: MAIN OR [facility];  Service: Pulmonary   ??? small intestines removed per pt          Social History     Occupational History   ??? Not on file.     Social History Main Topics   ??? Smoking status: Former Smoker     Packs/day: 1.00     Years: 30.00     Types: Cigarettes     Quit date: 05/15/2014   ??? Smokeless tobacco: Never Used   ??? Alcohol use No   ??? Drug use: No   ??? Sexual activity: Not on file   Pt is accompanied by his ?brother-in-law, who usually brings him to clinic    Family History  Brother had lung cancer.    Review of Systems:  A comprehensive review of 10 systems was negative except for pertinent positives noted in HPI.    Physical Exam:    VITAL SIGNS:  BP 111/75  - Pulse 116  - Temp 36.6 ??C (97.9 ??F) (Oral)  - Resp 16  - Ht 175.3 cm (5' 9.02)  - Wt 77.4 kg (170 lb 11.2 oz)  - SpO2 98%  - BMI 25.20 kg/m??   ECOG Performance Status: 2  GENERAL: Well-developed, well-nourished patient in no acute distress.  HEAD: Normocephalic and atraumatic.  EYES: Conjunctivae are normal. No scleral icterus.  MOUTH/THROAT: Oropharynx is clear and moist.  No mucosal lesions.  NECK: Supple, no thyromegaly.  LYMPHATICS: No palpable cervical, supraclavicular, or axillary adenopathy.  CARDIOVASCULAR: Normal rate, regular rhythm and normal heart sounds.  Exam reveals no gallop and no friction rub.  No murmur heard.  PULMONARY/CHEST: Effort normal and breath sounds normal. No respiratory distress.  ABDOMINAL:  Soft. There is no distension. There is no tenderness. There is no rebound and no guarding.  MUSCULOSKELETAL: No clubbing, cyanosis, or lower extremity edema.  PSYCHIATRIC: Alert and oriented.  Normal mood and affect.  NEUROLOGIC: No focal motor deficit. Normal gait.  SKIN: Skin is warm, dry, and intact.      Results/Orders:    Lab on 03/26/2017   Component Date Value Ref Range Status   ??? PSA 03/26/2017 50.70* 0.00 - 4.00 ng/mL Final   ??? Creatinine 03/26/2017 2.53* 0.70 - 1.30 mg/dL Final   ??? EGFR MDRD Af Amer 03/26/2017 32* >=60 mL/min/1.89m2 Final   ??? EGFR MDRD Non Af Amer 03/26/2017 26* >=60 mL/min/1.7m2 Final   ??? WBC 03/26/2017 11.4* 4.5 - 11.0 10*9/L Final   ??? RBC 03/26/2017 4.03* 4.50 - 5.90 10*12/L Final   ??? HGB 03/26/2017 12.5* 13.5 - 17.5 g/dL Final   ??? HCT 16/05/9603 38.5* 41.0 - 53.0 % Final   ??? MCV 03/26/2017 95.5  80.0 - 100.0 fL Final   ??? MCH 03/26/2017 31.1  26.0 - 34.0 pg Final   ??? MCHC 03/26/2017 32.5  31.0 - 37.0 g/dL Final   ??? RDW 54/04/8118 19.7* 12.0 - 15.0 % Final   ??? MPV 03/26/2017 7.8  7.0 - 10.0 fL Final   ??? Platelet 03/26/2017 359  150 - 440 10*9/L Final   ??? Absolute Neutrophils 03/26/2017 7.3  2.0 - 7.5 10*9/L Final   ??? Absolute Lymphocytes 03/26/2017 1.9  1.5 - 5.0 10*9/L Final   ??? Absolute Monocytes 03/26/2017 1.1* 0.2 - 0.8 10*9/L Final   ??? Absolute Eosinophils 03/26/2017 0.8* 0.0 - 0.4 10*9/L Final   ??? Absolute Basophils 03/26/2017 0.1  0.0 - 0.1 10*9/L Final   ??? Large Unstained Cells 03/26/2017 2  0 - 4 % Final   ??? Microcytosis 03/26/2017 Slight* Not Present Final   ??? Macrocytosis 03/26/2017 Moderate* Not Present Final   ??? Anisocytosis 03/26/2017 Moderate* Not Present Final       PSA   Date Value Ref Range Status   03/26/2017 50.70 (H) 0.00 - 4.00 ng/mL Final   02/21/2017 67.60 (H) 0.00 - 4.00 ng/mL Final   01/10/2017 60.80 (H) 0.00 - 4.00 ng/mL Final   10/11/2016 28.10 (H) 0.00 - 4.00 ng/mL Final   08/30/2016 24.90 (H) 0.00 - 4.00 ng/mL Final   07/19/2016 32.60 (H) 0.00 - 4.00 ng/mL Final   06/21/2016 21.00 (H) 0.00 - 4.00 ng/mL Final   03/15/2016 2.37 0.00 - 4.00 ng/mL Final   12/08/2015 1.72 0.00 - 4.00 ng/mL Final   09/08/2015 354.00 (H) 0.00 - 4.00 ng/mL Final   Baseline PSA prior to enzalutamide is 32.6 on 07/19/2016      Testosterone   Date Value Ref Range Status   06/21/2016 10 (L) 179 - 756 ng/dL Final   14/78/2956 213 (L) 179 - 756 ng/dL Final   08/65/7846 84 (L) 179 - 756 ng/dL Final        Administrations This Visit     oxyCODONE-acetaminophen (PERCOCET) 5-325 mg tablet 1 tablet     Admin Date  03/26/2017 Action  Given Dose  1 tablet  Route  Oral Administered By  Penny Pia, RN                  Orders placed or performed in visit on 03/26/17   ??? MRI Thoracic Spine W Wo Contrast   ??? MRI Lumbar Spine W Wo Contrast   ??? CBC w/ Differential   ??? PSA, Diagnostic   ??? Creatinine     Pathology:  Final Diagnosis    ?? A:?? Prostate,?? Right base, needle biopsy ??  - Prostatic adenocarcinoma, Gleason score 4 + 3 = 7 (Grade group 3) involving 2 of 2 cores, approximately 12 and 12 mm in discontinuous linear extent, approximately 80% of total core length.    B:?? Prostate,?? Right mid, needle biopsy   - Prostatic adenocarcinoma, Gleason score 4 + 3 = 7 (Grade group 3) involving 2 of 2 cores, approximately 11 and 7 mm in discontinuous linear extent, approximately 70% of total core length.    C:?? Prostate,?? Right apex, needle biopsy   - Prostatic adenocarcinoma, Gleason score 3 + 5 = 8 (Grade group 4) involving 2 of 2 cores, approximately 9 and 14 mm in discontinuous linear extent, approximately 80% of total core length. ??  - Perineural invasion identified in this case.    D:?? Prostate,?? Left base, needle biopsy   - Prostatic adenocarcinoma, Gleason score 4 + 5 = 9 (Grade group 5) involving 2 of 2 cores, approximately 13 and 13 mm in discontinuous linear extent, approximately 90% of total core length.     E:?? Prostate,?? Left mid, needle biopsy   - Prostatic adenocarcinoma, Gleason score 4 + 4 = 8 (Grade group 4) involving 2 of 2 cores, approximately 5 and 15 mm in discontinuous linear extent, approximately 70% of total core length.     F:?? Prostate,?? Left apex, needle biopsy   - Prostatic adenocarcinoma, Gleason score 4 + 5 = 9 (Grade group 5) involving 2 of 2 cores, approximately 9 and 11 mm in linear extent, approximately 90% of total core length. ??  - Suspicious for extraprostatic extension         Imaging results:  PET scan in 10/15/2014  IMPRESSION:  Markedly hypermetabolic right paratracheal lesion, consistent with  the patient's known malignancy. This is associated with  hypermetabolic metastases in the right hilum and scattered sclerotic  hypermetabolic bone metastases.    The cystic lesion in the pancreatic head is not hypermetabolic on  PET imaging. As such, this finding may not be related to metastatic  disease. Attention on followup imaging is suggested.    Mild FDG uptake in both adrenal glands with asymmetric appearance  and no underlying nodule or mass. Uptake felt to be physiologic, but  attention to these areas on follow-up is also recommended.    No evidence for hypermetabolic disease in the liver on today's  Study.    MRI of brain 08/30/2016  No evidence of intracranial metastasis      Bone scan 08/30/2016  Diffuse osseous metastatic disease, predominantly throughout the axial skeleton.    Bone scan 02/07/2017  Impression    Diffuse osseous metastatic disease predominantly in the axial skeleton, similar to prior. The only obvious new focus is in the right posterior ninth rib    X ray of femur R 02/21/2017  Impression      1. Unchanged severe right hip osteoarthrosis likely secondary to femoral head collapse related to osteonecrosis.    2. Likely distal femoral bone infarct. Otherwise, no  focal lytic or blastic bone lesion.

## 2017-03-28 NOTE — H&P (View-Only) (Signed)
HPI: This is a   very pleasant 56 year old man  who was referred to me by Curt Bears, MD  to evaluate  abnormal CT scan .    Chief complaint is abnormal CT scan:  His pancreas has been abnormal at least as far back as February 2016 when he had a CT scan. This showed "cystic lesion within the uncinate pancreas".  A follow-up PET CT scan February 2016 showed that the cystic area of the pancreas was not hypermetabolic.   He has Stage IV lung cancer and stage IV prostate cancer:  Gets imaging tests periodically to follow the above cancers.  He gets around with a walker or a wheelchair only. He is on nasal cannula oxygen for about 2 years. He is clearly fatigued. His brother-in-law's with him and provides much of the history.  He has no significant abdominal pains. He does have back discomfort. He is probably lost 10 pounds in the past few months.   Old Data Reviewed:  CT scan 12/2016 abd, pelvis without IV contrast: Pancreas: Chronic calcific pancreatitis again identified. Pancreatic duct mildly to moderately dilated at 7 mm in the head on image 68/series 2. Compare 4 mm on the prior exam. Partially calcified cystic area within the pancreatic head/ uncinate process is similar at on the order of 1.8 cm on image 73/series 2. No evidence of acute pancreatitis.  Labs 02/2017: Cr 2.7, T bili 0.3, alok phos 177, ast and alt normal; plts normal, hb 11.2     Review of systems: Pertinent positive and negative review of systems were noted in the above HPI section. All other review negative.   Past Medical History:  Diagnosis Date  . Chronic fatigue 04/12/2016  . Chronic pain 04/12/2016  . Chronic renal disease, stage III   . Metastatic cancer [facility])     Past Surgical History:  Procedure Laterality Date  . APPENDECTOMY    . VIDEO BRONCHOSCOPY N/A 09/21/2014   Procedure: VIDEO BRONCHOSCOPY WITH FLUORO;  Surgeon: Rigoberto Noel, MD;  Location: [place];  Service: Cardiopulmonary;   Laterality: N/A;  . VIDEO BRONCHOSCOPY WITH ENDOBRONCHIAL ULTRASOUND N/A 09/23/2014   Procedure: VIDEO BRONCHOSCOPY WITH ENDOBRONCHIAL ULTRASOUND;  Surgeon: Rigoberto Noel, MD;  Location: [place];  Service: Pulmonary;  Laterality: N/A;    Current Outpatient Prescriptions  Medication Sig Dispense Refill  . acetaminophen (TYLENOL) 325 MG tablet Take 2 tablets (650 mg total) by mouth every 6 (six) hours as needed for mild pain (or Fever >/= 101).    Marland Kitchen albuterol (PROVENTIL) (2.5 MG/3ML) 0.083% nebulizer solution Take 3 mLs (2.5 mg total) by nebulization every 2 (two) hours as needed for wheezing. 75 mL 12  . AMITIZA 24 MCG capsule Take 24 mcg by mouth 2 (two) times daily with a meal.     . bicalutamide (CASODEX) 50 MG tablet Take 50 mg by mouth daily.     . diphenhydrAMINE (BENADRYL) 25 MG tablet Take 25 mg by mouth at bedtime as needed for itching.     . feeding supplement, ENSURE ENLIVE, (ENSURE ENLIVE) LIQD Take 237 mLs by mouth 2 (two) times daily between meals. 235 mL 12  . folic acid (FOLVITE) 1 MG tablet Take 1 mg by mouth daily.    . furosemide (LASIX) 20 MG tablet Take 1 tablet (20 mg total) by mouth daily as needed for fluid or edema. 30 tablet 0  . lidocaine-prilocaine (EMLA) cream Apply 1 application topically as needed. Apply to port site prior to chemotherapy. [place]  g 0  . magnesium oxide (MAG-OX) 400 MG tablet Take 400 mg by mouth daily.    . metoprolol tartrate (LOPRESSOR) 25 MG tablet Take 0.5 tablets (12.5 mg total) by mouth 2 (two) times daily. 60 tablet 0  . mirtazapine (REMERON) 15 MG tablet Take 1 tablet (15 mg total) by mouth at bedtime.    Marland Kitchen oxyCODONE-acetaminophen (PERCOCET/ROXICET) 5-325 MG tablet Take 1 tablet by mouth every 4 (four) hours as needed for severe pain. 60 tablet 0  . polyethylene glycol (MIRALAX / GLYCOLAX) packet Take 17 g by mouth daily as needed for mild constipation.     . prochlorperazine (COMPAZINE) 10 MG tablet Take 1 tablet (10 mg total) by mouth every 6  (six) hours as needed for nausea or vomiting. 30 tablet 1  . senna-docusate (SENOKOT-S) 8.6-50 MG per tablet Take 1 tablet by mouth daily.    . sodium bicarbonate 650 MG tablet Take 1 tablet (650 mg total) by mouth daily. 30 tablet 0  . SUMAtriptan (IMITREX) 25 MG tablet Take 25 mg by mouth every 2 (two) hours as needed for migraine.     . tamsulosin (FLOMAX) 0.4 MG CAPS capsule Take 0.4 mg by mouth daily.     . valACYclovir (VALTREX) 1000 MG tablet Take 1,000 mg by mouth 2 (two) times daily.    [name] 40 MG capsule Take 160 mg by mouth daily.     Marland Kitchen levocetirizine (XYZAL) 5 MG tablet Take 5 mg by mouth.     No current facility-administered medications for this visit.     Allergies as of 03/18/2017  . (No Known Allergies)    Family History  Problem Relation Age of Onset  . Lung cancer Brother     Social History   Social History  . Marital status: Single    Spouse name: N/A  . Number of children: N/A  . Years of education: N/A   Occupational History  . Not on file.   Social History Main Topics  . Smoking status: Former Smoker    Packs/day: 1.00    Types: Cigarettes    Quit date: 06/20/2014  . Smokeless tobacco: Never Used  . Alcohol use 3.6 oz/week    6 Cans of beer per week  . Drug use: Yes    Types: Marijuana  . Sexual activity: Not Currently   Other Topics Concern  . Not on file   Social History Narrative  . No narrative on file     Physical Exam: BP (!) 82/62   Pulse (!) 125   Ht 5\' 9"  (1.753 m)   Wt 170 lb (77.1 kg)   BMI 25.10 kg/m  Constitutional: Chronically ill-appearing with nasal cannula oxygen in place, sitting near his walker. He is clearly fatigued Psychiatric: alert and oriented x3 Eyes: extraocular movements intact Mouth: oral pharynx moist, no lesions Neck: supple no lymphadenopathy Cardiovascular: heart regular rate and rhythm Lungs: clear to auscultation bilaterally Abdomen: soft, nontender, nondistended, no obvious ascites, no  peritoneal signs, normal bowel sounds Extremities: no lower extremity edema bilaterally Skin: no lesions on visible extremities   Assessment and plan: 56 y.o. male with  metastatic lung cancer, metastatic prostate cancer, chronically abnormal pancreas with increased size of pancreatic duct on recent imaging 2 months ago.  His pancreas has been of normal for at least 2-1/2 years now. PET scan early 2016 showed that the cystic area of his pancreas was not hypermetabolic. I doubt he has underlying pancreatic adenocarcinoma given the chronicity of  his CT findings. He was a heavy alcohol drinker many years ago perhaps has some chronic scarring of his pancreas. We discussed possible endoscopic ultrasound of the pancreas with fine-needle aspiration if any obvious lesions are noted. We also discussed repeating PET scan. The other option is to simply observe him clinically. He has 2 metastatic cancers and is in chronically very poor health because of it. I'm going to contact his oncologist here in [place] to discuss these options. His brother-in-law and he seem to prefer invasive testing, biopsy.    Please see the "Patient Instructions" section for addition details about the plan.   Owens Loffler, MD [facility] 03/18/2017, 8:09 AM  Cc: Curt Bears, MD

## 2017-03-28 NOTE — Op Note (Signed)
[facility] Patient Name: Francisco Love Procedure Date: 03/28/2017 MRN: [id number] Attending MD: Milus Banister , MD Date of Birth: [birth year] CSN: [id number] Age: 56 Admit Type: Outpatient Procedure:                Upper EUS Indications:              Suspected mass in pancreas on CT; dates back to at                            least 09/2014 (cystic lesion head/uncinate pancreas                            with scattered calcifications) was not PET avid                            that same month. Known metastatic lung cancer. Also                            known metastatic prostate cancer. Providers:                Milus Banister, MD, Zenon Mayo, RN, Cletis Athens, Technician Referring MD:              Medicines:                Monitored Anesthesia Care, Cipro 631 mg IV Complications:            No immediate complications. Estimated blood loss:                            None. Estimated Blood Loss:     Estimated blood loss: none. Procedure:                Pre-Anesthesia Assessment:                           - Prior to the procedure, a History and Physical                            was performed, and patient medications and                            allergies were reviewed. The patient's tolerance of                            previous anesthesia was also reviewed. The risks                            and benefits of the procedure and the sedation                            options and risks were discussed with the patient.  All questions were answered, and informed consent                            was obtained. Prior Anticoagulants: The patient has                            taken no previous anticoagulant or antiplatelet                            agents. ASA Grade Assessment: IV - A patient with                            severe systemic disease that is a constant threat                            to life.  After reviewing the risks and benefits,                            the patient was deemed in satisfactory condition to                            undergo the procedure.                           After obtaining informed consent, the endoscope was                            passed under direct vision. Throughout the                            procedure, the patient's blood pressure, pulse, and                            oxygen saturations were monitored continuously. The                            [id number] ([id number]) scope was introduced through                            the mouth, and advanced to the second part of                            duodenum. The [id number] ([id number]) scope was                            introduced through the mouth, and advanced to the                            second part of duodenum. The upper EUS was                            accomplished without difficulty. The patient  tolerated the procedure well. Scope In: Scope Out: Findings:      Endoscopic Finding :      There was a moderate amount of solid, liquid gastric contents.      The UGI tract was otherwise normal      Endosonographic Finding :      1. There was a mutlicystic lesion in the head, uncinate pancreas that       measured 2.5cm and was without an evident associated soft tissue mass.       There were calcifications within the lesion. Fine needle aspiration for       cytology was performed. Color Doppler imaging was utilized prior to       needle puncture to confirm a lack of significant vascular structures       within the needle path. One pass was made with the 22 gauge needle using       a transduodenal approach. Final cytology results are pending.      2. The main pancreatic duct was dilated (39mm in body) and seemed to       communicate with the cystic lesion described above.      3. The pancreatic parenchyma in neck, body and tail was normal appearing.      4. No  peripancreatic adenopathy.      5. Limited views of liver, spleen, portal and splenic vessels were all       normal. Impression:               - Multicystic lesion in head/uncinate pancreas that                            communicates with the main pancreatic duct and is                            associated with multiple calcifications. FNA of the                            lesion was performed, await final cytology. I                            currently favor IPMN, perhaps serous cyst adenoma                            or focal chronic pancreatitis. It has been present                            since at least 2016 and at that time was not PET                            avid. I think it is unlikely to harbor malignancy                            and also unlikely to represent metastatic spread                            from either his Stage IV prostate cancer or Stage  IV lung cancer. Moderate Sedation:      N/A- Per Anesthesia Care Recommendation:           - Discharge patient to home (ambulatory).                           - He will complete 3 days of twice daily cipro. Procedure Code(s):        --- Professional ---                           [phone], Esophagogastroduodenoscopy, flexible,                            transoral; with transendoscopic ultrasound-guided                            intramural or transmural fine needle                            aspiration/biopsy(s), (includes endoscopic                            ultrasound examination limited to the esophagus,                            stomach or duodenum, and adjacent structures) Diagnosis Code(s):        --- Professional ---                           P53.6, Cyst of pancreas                           R93.3, Abnormal findings on diagnostic imaging of                            other parts of digestive tract CPT copyright 2016 American Medical Association. All rights reserved. The codes documented  in this report are preliminary and upon coder review may  be revised to meet current compliance requirements. Milus Banister, MD 03/28/2017 12:30:42 PM This report has been signed electronically. Number of Addenda: 0

## 2017-03-28 NOTE — Anesthesia Procedure Notes (Signed)
Procedure Name: MAC Date/Time: 03/28/2017 11:45 AM Performed by: Lollie Sails Oxygen Delivery Method: Nasal cannula

## 2017-03-28 NOTE — Transfer of Care (Signed)
Immediate Anesthesia Transfer of Care Note  Patient: Francisco Love  Procedure(s) Performed: Procedure(s): UPPER ENDOSCOPIC ULTRASOUND (EUS) RADIAL (N/A)  Patient Location: PACU and Endoscopy Unit  Anesthesia Type:MAC  Level of Consciousness: awake and alert   Airway & Oxygen Therapy: Patient Spontanous Breathing and Patient connected to nasal cannula oxygen  Post-op Assessment: Report given to RN and Post -op Vital signs reviewed and stable  Post vital signs: Reviewed and stable  Last Vitals:  Vitals:   03/28/17 1038  BP: (!) 82/63  Pulse: (!) 106  Resp: 19  Temp: 36.8 C  SpO2: 100%    Last Pain:  Vitals:   03/28/17 1038  TempSrc: Oral         Complications: No apparent anesthesia complications

## 2017-03-28 NOTE — Discharge Instructions (Signed)

## 2017-03-28 NOTE — Anesthesia Preprocedure Evaluation (Addendum)
Anesthesia Evaluation  Patient identified by MRN, date of birth, ID band Patient awake  General Assessment Comment:Had bite of sandwich c pill this am, 7am  Reviewed: Allergy & Precautions, NPO status , Patient's Chart, lab work & pertinent test results  History of Anesthesia Complications Negative for: history of anesthetic complications  Airway Mallampati: I  TM Distance: >3 FB Neck ROM: Full    Dental no notable dental hx.    Pulmonary shortness of breath and Long-Term Oxygen Therapy, former smoker,  Lung ca metastatic, good sized lung and mediastinal mass   breath sounds clear to auscultation       Cardiovascular negative cardio ROS   Rhythm:Regular Rate:Normal     Neuro/Psych    GI/Hepatic Neg liver ROS,   Endo/Other  negative endocrine ROS  Renal/GU Renal disease     Musculoskeletal negative musculoskeletal ROS (+)   Abdominal   Peds  Hematology negative hematology ROS (+)   Anesthesia Other Findings   Reproductive/Obstetrics                           Anesthesia Physical Anesthesia Plan  ASA: III  Anesthesia Plan: MAC   Post-op Pain Management:    Induction: Intravenous  PONV Risk Score and Plan: 1 and Ondansetron and Dexamethasone  Airway Management Planned: Natural Airway and Nasal Cannula  Additional Equipment:   Intra-op Plan:   Post-operative Plan:   Informed Consent: I have reviewed the patients History and Physical, chart, labs and discussed the procedure including the risks, benefits and alternatives for the proposed anesthesia with the patient or authorized representative who has indicated his/her understanding and acceptance.   Dental advisory given  Plan Discussed with: CRNA  Anesthesia Plan Comments:         Anesthesia Quick Evaluation

## 2017-03-28 NOTE — Interval H&P Note (Signed)
History and Physical Interval Note:  03/28/2017 9:51 AM  Francisco Love  has presented today for surgery, with the diagnosis of abnormal pancreas   The various methods of treatment have been discussed with the patient and family. After consideration of risks, benefits and other options for treatment, the patient has consented to  Procedure(s): UPPER ENDOSCOPIC ULTRASOUND (EUS) RADIAL (N/A) as a surgical intervention .  The patient's history has been reviewed, patient examined, no change in status, stable for surgery.  I have reviewed the patient's chart and labs.  Questions were answered to the patient's satisfaction.     Milus Banister

## 2017-03-29 NOTE — Unmapped (Signed)
Bruce Parker  Specialty Medication(s): Bruce Parker  Additional Medications shipped: n/a    Bruce Parker, DOB: 05/01/1961  Phone: [phone] (home) , Alternate phone contact: N/A  Phone or address changes today?: No  All above HIPAA information was verified with patient's caregiver.  Shipping Address: [address]  [place] [address]   Insurance changes? No    Completed refill call assessment today to schedule patient's medication shipment from the [facility] [phone]).      Confirmed the medication and dosage are correct and have not changed: Yes, regimen is correct and unchanged.    Confirmed patient started or stopped the following medications in the past month:  No, there are no changes reported at this time.    Are you tolerating your medication?:  Bruce Parker reports tolerating the medication.    ADHERENCE    (Below is required for Medicare Part B or Transplant patients only - per drug):   How many tablets were dispensed last month: 120  Patient currently has 20 remaining.    Did you miss any doses in the past 4 weeks? No missed doses reported.    FINANCIAL/SHIPPING    Delivery Scheduled: Yes, Expected medication delivery date: 04/02/2017     Bruce Parker did not have any additional questions at this time.    Delivery address validated in [facility] scheduling system: Yes, address listed in [facility] is correct.    We will follow up with patient monthly for standard refill processing and delivery.      Thank you,  Courtney Paris   [facility] Specialty Pharmacist

## 2017-03-30 NOTE — Unmapped (Signed)
AOC Triage Note     Patient: Bruce Parker     Reason for call:  [facility] needs MRI orders    Time call returned: 1730     Phone Assessment: Attempted to call [facility] but their office is closed.     Triage Recommendations: There is an external mri order in EPIC but I cannot tell if it was sent to [facility]... Will send this to NN with high importance.     Patient Response: na     Outstanding tasks: NN to resend order for mri to [facility].... (The order in EPIC  just says external... I don't have a fax # because the office is closed.)

## 2017-03-30 NOTE — Unmapped (Signed)
-----   Message from Kelli Hope sent at 03/29/2017  4:03 PM EDT -----  Regarding: pt needs order for MRI for Monday, 8/13  Good afternoon,    Peggy from [facility] called regarding patient Bruce Parker. The patient was referred to their facility for an MRI thoracic spine with/wo contrast and MRI lumbar spine with/wo contrast. Peggy tells me they haven't received an order for those scans and need it as soon as possible. The scan is scheduled Monday.    Please contact Peggy at [phone].    Thank you,  Kelli Hope  [facility]  [phone]

## 2017-03-31 ENCOUNTER — Encounter (HOSPITAL_COMMUNITY): Payer: Self-pay | Admitting: Gastroenterology

## 2017-04-01 ENCOUNTER — Ambulatory Visit
Admission: RE | Admit: 2017-04-01 | Discharge: 2017-04-01 | Disposition: A | Discharge status: Home / Self Care | Payer: Medicare Other | Source: Ambulatory Visit | Attending: Oncology | Admitting: Oncology

## 2017-04-01 DIAGNOSIS — M48061 Spinal stenosis, lumbar region without neurogenic claudication: Secondary | ICD-10-CM | POA: Insufficient documentation

## 2017-04-01 DIAGNOSIS — C7951 Secondary malignant neoplasm of bone: Secondary | ICD-10-CM | POA: Diagnosis not present

## 2017-04-01 DIAGNOSIS — M4856XA Collapsed vertebra, not elsewhere classified, lumbar region, initial encounter for fracture: Secondary | ICD-10-CM | POA: Insufficient documentation

## 2017-04-01 MED ORDER — GADOBENATE DIMEGLUMINE 529 MG/ML IV SOLN
8.0000 mL | Freq: Once | INTRAVENOUS | Status: AC | PRN
  Administered 2017-04-01: 8 mL via INTRAVENOUS

## 2017-04-01 MED FILL — XTANDI/40MG/CAPS: XTANDI/40MG/CAPS | 30 days supply | Qty: 120 | Fill #5

## 2017-04-02 NOTE — Anesthesia Postprocedure Evaluation (Signed)
Anesthesia Post Note  Patient: Francisco Love  Procedure(s) Performed: Procedure(s) (LRB): UPPER ENDOSCOPIC ULTRASOUND (EUS) RADIAL (N/A)     Patient location during evaluation: Endoscopy Anesthesia Type: MAC Level of consciousness: awake and alert Pain management: pain level controlled Vital Signs Assessment: post-procedure vital signs reviewed and stable Respiratory status: spontaneous breathing, nonlabored ventilation, respiratory function stable and patient connected to nasal cannula oxygen Cardiovascular status: stable and blood pressure returned to baseline Anesthetic complications: no    Last Vitals:  Vitals:   03/28/17 1310 03/28/17 1320  BP: 94/65 101/66  Pulse: (!) 107 100  Resp: (!) 22 20  Temp:    SpO2: 98% 92%    Last Pain:  Vitals:   03/28/17 1038  TempSrc: Oral                 Lakyra Tippins,JAMES TERRILL

## 2017-04-04 ENCOUNTER — Ambulatory Visit: Admit: 2017-04-04 | Discharge: 2017-04-04 | Disposition: A | Discharge status: Home / Self Care | Payer: MEDICARE

## 2017-04-04 DIAGNOSIS — C61 Malignant neoplasm of prostate: Principal | ICD-10-CM

## 2017-04-09 ENCOUNTER — Other Ambulatory Visit: Payer: Self-pay | Admitting: Medical Oncology

## 2017-04-09 DIAGNOSIS — C3491 Malignant neoplasm of unspecified part of right bronchus or lung: Secondary | ICD-10-CM

## 2017-04-09 NOTE — Unmapped (Signed)
Appointment 04/15/17 at 1:15 pm with Dr Janyth Contes in [facility]

## 2017-04-09 NOTE — Unmapped (Signed)
Patient needs appt to have prescription refilled, patient has not been seen in over a year. Please call patient and advise them.   Thanks

## 2017-04-10 ENCOUNTER — Ambulatory Visit (HOSPITAL_BASED_OUTPATIENT_CLINIC_OR_DEPARTMENT_OTHER): Payer: Medicare Other

## 2017-04-10 ENCOUNTER — Telehealth: Payer: Self-pay | Admitting: Internal Medicine

## 2017-04-10 ENCOUNTER — Encounter: Payer: Self-pay | Admitting: Internal Medicine

## 2017-04-10 ENCOUNTER — Ambulatory Visit (HOSPITAL_BASED_OUTPATIENT_CLINIC_OR_DEPARTMENT_OTHER): Payer: Medicare Other | Admitting: Internal Medicine

## 2017-04-10 ENCOUNTER — Other Ambulatory Visit (HOSPITAL_BASED_OUTPATIENT_CLINIC_OR_DEPARTMENT_OTHER): Payer: Medicare Other

## 2017-04-10 VITALS — HR 88

## 2017-04-10 DIAGNOSIS — C7951 Secondary malignant neoplasm of bone: Secondary | ICD-10-CM

## 2017-04-10 DIAGNOSIS — C3491 Malignant neoplasm of unspecified part of right bronchus or lung: Secondary | ICD-10-CM

## 2017-04-10 DIAGNOSIS — G893 Neoplasm related pain (acute) (chronic): Secondary | ICD-10-CM

## 2017-04-10 DIAGNOSIS — Z5112 Encounter for antineoplastic immunotherapy: Secondary | ICD-10-CM | POA: Diagnosis present

## 2017-04-10 DIAGNOSIS — C3401 Malignant neoplasm of right main bronchus: Secondary | ICD-10-CM

## 2017-04-10 DIAGNOSIS — G8929 Other chronic pain: Secondary | ICD-10-CM

## 2017-04-10 DIAGNOSIS — C787 Secondary malignant neoplasm of liver and intrahepatic bile duct: Secondary | ICD-10-CM

## 2017-04-10 DIAGNOSIS — C7889 Secondary malignant neoplasm of other digestive organs: Secondary | ICD-10-CM

## 2017-04-10 DIAGNOSIS — C801 Malignant (primary) neoplasm, unspecified: Secondary | ICD-10-CM

## 2017-04-10 DIAGNOSIS — I951 Orthostatic hypotension: Secondary | ICD-10-CM

## 2017-04-10 DIAGNOSIS — R5382 Chronic fatigue, unspecified: Secondary | ICD-10-CM

## 2017-04-10 LAB — COMPREHENSIVE METABOLIC PANEL
ALT: 14 U/L (ref 0–55)
ANION GAP: 12 meq/L — AB (ref 3–11)
AST: 22 U/L (ref 5–34)
Albumin: 2.8 g/dL — ABNORMAL LOW (ref 3.5–5.0)
Alkaline Phosphatase: 221 U/L — ABNORMAL HIGH (ref 40–150)
BUN: 49.2 mg/dL — AB (ref 7.0–26.0)
CALCIUM: 9.2 mg/dL (ref 8.4–10.4)
CO2: 14 meq/L — AB (ref 22–29)
CREATININE: 2.3 mg/dL — AB (ref 0.7–1.3)
Chloride: 115 mEq/L — ABNORMAL HIGH (ref 98–109)
EGFR: 35 mL/min/{1.73_m2} — ABNORMAL LOW (ref >90)
Glucose: 69 mg/dl — ABNORMAL LOW (ref 70–140)
Potassium: 4.5 mEq/L (ref 3.5–5.1)
Sodium: 141 mEq/L (ref 136–145)
TOTAL PROTEIN: 7 g/dL (ref 6.4–8.3)

## 2017-04-10 LAB — CBC WITH DIFFERENTIAL/PLATELET
BASO%: 0.3 % (ref 0.0–2.0)
Basophils Absolute: 0 10*3/uL (ref 0.0–0.1)
EOS ABS: 0.9 10*3/uL — AB (ref 0.0–0.5)
EOS%: 11.3 % — ABNORMAL HIGH (ref 0.0–7.0)
HEMATOCRIT: 34.6 % — AB (ref 38.4–49.9)
HGB: 11.5 g/dL — ABNORMAL LOW (ref 13.0–17.1)
LYMPH#: 1.4 10*3/uL (ref 0.9–3.3)
LYMPH%: 17.5 % (ref 14.0–49.0)
MCH: 31.7 pg (ref 27.2–33.4)
MCHC: 33.2 g/dL (ref 32.0–36.0)
MCV: 95.3 fL (ref 79.3–98.0)
MONO#: 0.6 10*3/uL (ref 0.1–0.9)
MONO%: 7.9 % (ref 0.0–14.0)
NEUT%: 63 % (ref 39.0–75.0)
NEUTROS ABS: 5 10*3/uL (ref 1.5–6.5)
NRBC: 0 % (ref 0–0)
PLATELETS: 260 10*3/uL (ref 140–400)
RBC: 3.63 10*6/uL — AB (ref 4.20–5.82)
RDW: 18.7 % — AB (ref 11.0–14.6)
WBC: 7.9 10*3/uL (ref 4.0–10.3)

## 2017-04-10 MED ORDER — HEPARIN SOD (PORK) LOCK FLUSH 100 UNIT/ML IV SOLN
500.0000 [IU] | Freq: Once | INTRAVENOUS | Status: AC | PRN
  Administered 2017-04-10: 500 [IU]
  Filled 2017-04-10: qty 5

## 2017-04-10 MED ORDER — SODIUM CHLORIDE 0.9 % IV SOLN
480.0000 mg | Freq: Once | INTRAVENOUS | Status: AC
  Administered 2017-04-10: 480 mg via INTRAVENOUS
  Filled 2017-04-10: qty 48

## 2017-04-10 MED ORDER — SODIUM CHLORIDE 0.9 % IV SOLN
Freq: Once | INTRAVENOUS | Status: AC
  Administered 2017-04-10: 10:00:00 via INTRAVENOUS

## 2017-04-10 MED ORDER — SODIUM CHLORIDE 0.9% FLUSH
10.0000 mL | INTRAVENOUS | Status: DC | PRN
  Administered 2017-04-10: 10 mL
  Filled 2017-04-10: qty 10

## 2017-04-10 MED ORDER — OXYCODONE-ACETAMINOPHEN 5-325 MG PO TABS: 1.0000 | ORAL_TABLET | Freq: Four times a day (QID) | ORAL | 0 refills | Status: DC | PRN

## 2017-04-10 NOTE — Patient Instructions (Signed)
[facility] Discharge Instructions for Patients Receiving Chemotherapy  Today you received the following chemotherapy agents Opdivo   To help prevent nausea and vomiting after your treatment, we encourage you to take your nausea medication as directed.   If you develop nausea and vomiting that is not controlled by your nausea medication, call the clinic.   BELOW ARE SYMPTOMS THAT SHOULD BE REPORTED IMMEDIATELY:  *FEVER GREATER THAN 100.5 F  *CHILLS WITH OR WITHOUT FEVER  NAUSEA AND VOMITING THAT IS NOT CONTROLLED WITH YOUR NAUSEA MEDICATION  *UNUSUAL SHORTNESS OF BREATH  *UNUSUAL BRUISING OR BLEEDING  TENDERNESS IN MOUTH AND THROAT WITH OR WITHOUT PRESENCE OF ULCERS  *URINARY PROBLEMS  *BOWEL PROBLEMS  UNUSUAL RASH Items with * indicate a potential emergency and should be followed up as soon as possible.  Feel free to call the clinic you have any questions or concerns. The clinic phone number is [phone].  Please show the [place] at check-in to the Emergency Department and triage nurse.   Dehydration, Adult Dehydration is a condition in which there is not enough fluid or water in the body. This happens when you lose more fluids than you take in. Important organs, such as the kidneys, brain, and heart, cannot function without a proper amount of fluids. Any loss of fluids from the body can lead to dehydration. Dehydration can range from mild to severe. This condition should be treated right away to prevent it from becoming severe. What are the causes? This condition may be caused by:  Vomiting.  Diarrhea.  Excessive sweating, such as from heat exposure or exercise.  Not drinking enough fluid, especially: ? When ill. ? While doing activity that requires a lot of energy.  Excessive urination.  Fever.  Infection.  Certain medicines, such as medicines that cause the body to lose excess fluid (diuretics).  Inability to access safe  drinking water.  Reduced physical ability to get adequate water and food.  What increases the risk? This condition is more likely to develop in people:  Who have a poorly controlled long-term (chronic) illness, such as diabetes, heart disease, or kidney disease.  Who are age 22 or older.  Who are disabled.  Who live in a place with high altitude.  Who play endurance sports.  What are the signs or symptoms? Symptoms of mild dehydration may include:  Thirst.  Dry lips.  Slightly dry mouth.  Dry, warm skin.  Dizziness. Symptoms of moderate dehydration may include:  Very dry mouth.  Muscle cramps.  Dark urine. Urine may be the color of tea.  Decreased urine production.  Decreased tear production.  Heartbeat that is irregular or faster than normal (palpitations).  Headache.  Light-headedness, especially when you stand up from a sitting position.  Fainting (syncope). Symptoms of severe dehydration may include:  Changes in skin, such as: ? Cold and clammy skin. ? Blotchy (mottled) or pale skin. ? Skin that does not quickly return to normal after being lightly pinched and released (poor skin turgor).  Changes in body fluids, such as: ? Extreme thirst. ? No tear production. ? Inability to sweat when body temperature is high, such as in hot weather. ? Very little urine production.  Changes in vital signs, such as: ? Weak pulse. ? Pulse that is more than 100 beats a minute when sitting still. ? Rapid breathing. ? Low blood pressure.  Other changes, such as: ? Sunken eyes. ? Cold hands and feet. ? Confusion. ? Lack  of energy (lethargy). ? Difficulty waking up from sleep. ? Short-term weight loss. ? Unconsciousness. How is this diagnosed? This condition is diagnosed based on your symptoms and a physical exam. Blood and urine tests may be done to help confirm the diagnosis. How is this treated? Treatment for this condition depends on the severity. Mild  or moderate dehydration can often be treated at home. Treatment should be started right away. Do not wait until dehydration becomes severe. Severe dehydration is an emergency and it needs to be treated in a hospital. Treatment for mild dehydration may include:  Drinking more fluids.  Replacing salts and minerals in your blood (electrolytes) that you may have lost. Treatment for moderate dehydration may include:  Drinking an oral rehydration solution (ORS). This is a drink that helps you replace fluids and electrolytes (rehydrate). It can be found at pharmacies and retail stores. Treatment for severe dehydration may include:  Receiving fluids through an IV tube.  Receiving an electrolyte solution through a feeding tube that is passed through your nose and into your stomach (nasogastric tube, or NG tube).  Correcting any abnormalities in electrolytes.  Treating the underlying cause of dehydration. Follow these instructions at home:  If directed by your health care provider, drink an ORS: ? Make an ORS by following instructions on the package. ? Start by drinking small amounts, about  cup (120 mL) every 5-10 minutes. ? Slowly increase how much you drink until you have taken the amount recommended by your health care provider.  Drink enough clear fluid to keep your urine clear or pale yellow. If you were told to drink an ORS, finish the ORS first, then start slowly drinking other clear fluids. Drink fluids such as: ? Water. Do not drink only water. Doing that can lead to having too little salt (sodium) in the body (hyponatremia). ? Ice chips. ? Fruit juice that you have added water to (diluted fruit juice). ? Low-calorie sports drinks.  Avoid: ? Alcohol. ? Drinks that contain a lot of sugar. These include high-calorie sports drinks, fruit juice that is not diluted, and soda. ? Caffeine. ? Foods that are greasy or contain a lot of fat or sugar.  Take over-the-counter and prescription  medicines only as told by your health care provider.  Do not take sodium tablets. This can lead to having too much sodium in the body (hypernatremia).  Eat foods that contain a healthy balance of electrolytes, such as bananas, oranges, potatoes, tomatoes, and spinach.  Keep all follow-up visits as told by your health care provider. This is important. Contact a health care provider if:  You have abdominal pain that: ? Gets worse. ? Stays in one area (localizes).  You have a rash.  You have a stiff neck.  You are more irritable than usual.  You are sleepier or more difficult to wake up than usual.  You feel weak or dizzy.  You feel very thirsty.  You have urinated only a small amount of very dark urine over 6-8 hours. Get help right away if:  You have symptoms of severe dehydration.  You cannot drink fluids without vomiting.  Your symptoms get worse with treatment.  You have a fever.  You have a severe headache.  You have vomiting or diarrhea that: ? Gets worse. ? Does not go away.  You have blood or green matter (bile) in your vomit.  You have blood in your stool. This may cause stool to look black and tarry.  You  have not urinated in 6-8 hours.  You faint.  Your heart rate while sitting still is over 100 beats a minute.  You have trouble breathing. This information is not intended to replace advice given to you by your health care provider. Make sure you discuss any questions you have with your health care provider. Document Released: 08/06/2005 Document Revised: 03/02/2016 Document Reviewed: 09/30/2015 Elsevier Interactive Patient Education  [employer].

## 2017-04-10 NOTE — Progress Notes (Signed)
[place] [phone]   Fax:(336) [place], [place] [address]  DIAGNOSIS:  1) Stage IV (T2b, N2, M1b) non-small cell lung cancer, adenocarcinoma with negative EGFR mutation and negative gene translocation presented with a large right hilar mass as well as mediastinal lymphadenopathy and metastatic disease to the liver, bone and pancreas diagnosed in February 2016. 2) prostate adenocarcinoma diagnosed at [facility] with Gleason score 9 (4+5). 3) treatment with Radium 223 for prostate cancer at [facility] on 02/21/2017  PRIOR THERAPY:  1) Systemic chemotherapy with carboplatin for AUC of 5 and Alimta 500 MG/M2 every 3 weeks. Status post 6 cycles. 2) Enzalutamide for prostate cancer at [facility] 3) Immunotherapy with Nivolumab 240 MG every 2 weeks, status post 48 cycles. 4) Radium 223 monthly at [facility], S/p 2 cycles.  CURRENT THERAPY:  Immunotherapy with Nivolumab 480 MG every 4 weeks, for a cycle 04/10/2017.  INTERVAL HISTORY: Francisco Love 56 y.o. male returns to the clinic today for follow-up visit accompanied by his brother-in-law. The patient is feeling fine today with no specific complaints except for pain in the lower back and right hip area. He has multiple osseous metastatic disease from his prostate cancer and he is currently treated with Radium 223 at [facility]. He had diarrhea for a week at that time and his treatment with immunotherapy was delayed by 2 weeks until the patient recovers. He is feeling much better. He actually has constipation now. He denied having any chest pain but continues to have shortness breath with exertion was no cough or hemoptysis. He denied having any weight loss or night sweats. He has no nausea, vomiting. He is here today for evaluation before starting the first dose of immunotherapy with the Nivolumab 480 mg every 4  weeks.   MEDICAL HISTORY: Past Medical History:  Diagnosis Date  . Chronic fatigue 04/12/2016  . Chronic pain 04/12/2016  . Chronic renal disease, stage III   . Metastatic cancer ([place])     ALLERGIES:  has No Known Allergies.  MEDICATIONS:  Current Outpatient Prescriptions  Medication Sig Dispense Refill  . albuterol (PROVENTIL) (2.5 MG/3ML) 0.083% nebulizer solution Take 3 mLs (2.5 mg total) by nebulization every 2 (two) hours as needed for wheezing. 75 mL 12  . AMITIZA 24 MCG capsule Take 24 mcg by mouth 2 (two) times daily with a meal.     . bicalutamide (CASODEX) 50 MG tablet Take 50 mg by mouth daily.     . ciprofloxacin (CIPRO) 500 MG tablet Take 1 tablet (500 mg total) by mouth 2 (two) times daily. 6 tablet 0  . diphenhydrAMINE (BENADRYL) 25 MG tablet Take 25 mg by mouth at bedtime as needed for itching.     . folic acid (FOLVITE) 1 MG tablet Take 1 mg by mouth daily.    . furosemide (LASIX) 20 MG tablet Take 1 tablet (20 mg total) by mouth daily as needed for fluid or edema. 30 tablet 0  . levocetirizine (XYZAL) 5 MG tablet Take 5 mg by mouth daily.     Marland Kitchen lidocaine-prilocaine (EMLA) cream Apply 1 application topically as needed. Apply to port site prior to chemotherapy. 30 g 0  . magnesium oxide (MAG-OX) 400 MG tablet Take 400 mg by mouth daily.    . metoprolol tartrate (LOPRESSOR) 25 MG tablet Take 0.5 tablets (12.5 mg total) by mouth 2 (  two) times daily. 60 tablet 0  . mirtazapine (REMERON) 15 MG tablet Take 1 tablet (15 mg total) by mouth at bedtime.    Marland Kitchen oxyCODONE-acetaminophen (PERCOCET/ROXICET) 5-325 MG tablet Take 1 tablet by mouth every 4 (four) hours as needed for severe pain. 60 tablet 0  . polyethylene glycol (MIRALAX / GLYCOLAX) packet Take 17 g by mouth daily as needed for mild constipation.     . prochlorperazine (COMPAZINE) 10 MG tablet Take 1 tablet (10 mg total) by mouth every 6 (six) hours as needed for nausea or vomiting. 30 tablet 1  . senna-docusate  (SENOKOT-S) 8.6-50 MG per tablet Take 1 tablet by mouth daily.    . sodium bicarbonate 650 MG tablet Take 1 tablet (650 mg total) by mouth daily. 30 tablet 0  . SUMAtriptan (IMITREX) 25 MG tablet Take 25 mg by mouth every 2 (two) hours as needed for migraine.     . tamsulosin (FLOMAX) 0.4 MG CAPS capsule Take 0.4 mg by mouth daily.     . valACYclovir (VALTREX) 500 MG tablet Take 500 mg by mouth 2 (two) times daily.    [name] 40 MG capsule Take 160 mg by mouth every evening.      No current facility-administered medications for this visit.     SURGICAL HISTORY:  Past Surgical History:  Procedure Laterality Date  . APPENDECTOMY    . EUS N/A 03/28/2017   Procedure: UPPER ENDOSCOPIC ULTRASOUND (EUS) RADIAL;  Surgeon: Milus Banister, MD;  Location: [facility];  Service: Endoscopy;  Laterality: N/A;  . VIDEO BRONCHOSCOPY N/A 09/21/2014   Procedure: VIDEO BRONCHOSCOPY WITH FLUORO;  Surgeon: Rigoberto Noel, MD;  Location: [facility];  Service: Cardiopulmonary;  Laterality: N/A;  . VIDEO BRONCHOSCOPY WITH ENDOBRONCHIAL ULTRASOUND N/A 09/23/2014   Procedure: VIDEO BRONCHOSCOPY WITH ENDOBRONCHIAL ULTRASOUND;  Surgeon: Rigoberto Noel, MD;  Location: [facility];  Service: Pulmonary;  Laterality: N/A;    REVIEW OF SYSTEMS:  A comprehensive review of systems was negative except for: Constitutional: positive for fatigue Respiratory: positive for dyspnea on exertion Musculoskeletal: positive for back pain   PHYSICAL EXAMINATION: General appearance: alert, cooperative, fatigued and no distress Head: Normocephalic, without obvious abnormality, atraumatic Neck: no adenopathy, no JVD, supple, symmetrical, trachea midline and thyroid not enlarged, symmetric, no tenderness/mass/nodules Lymph nodes: Cervical, supraclavicular, and axillary nodes normal. Resp: clear to auscultation bilaterally Back: symmetric, no curvature. ROM normal. No CVA tenderness. Cardio: regular rate and rhythm, S1, S2 normal, no murmur,  click, rub or gallop GI: soft, non-tender; bowel sounds normal; no masses,  no organomegaly Extremities: extremities normal, atraumatic, no cyanosis or edema   ECOG PERFORMANCE STATUS: 1 - Symptomatic but completely ambulatory  Blood pressure 99/68, pulse (!) 106, temperature (!) 97.5 F (36.4 C), temperature source Oral, resp. rate 20, height 5' 9"  (1.753 m), weight 171 lb (77.6 kg), SpO2 97 %.  LABORATORY DATA: Lab Results  Component Value Date   WBC 7.9 04/10/2017   HGB 11.5 (L) 04/10/2017   HCT 34.6 (L) 04/10/2017   MCV 95.3 04/10/2017   PLT 260 04/10/2017      Chemistry      Component Value Date/Time   NA 136 03/27/2017 0751   K 4.7 03/27/2017 0751   CL 104 12/24/2016 0849   CO2 16 (L) 03/27/2017 0751   BUN 25.9 03/27/2017 0751   CREATININE 2.5 (H) 03/27/2017 0751      Component Value Date/Time   CALCIUM 9.9 03/27/2017 0751   ALKPHOS 258 (H) 03/27/2017  0751   AST 16 03/27/2017 0751   ALT 9 03/27/2017 0751   BILITOT 0.30 03/27/2017 0751       RADIOGRAPHIC STUDIES: Ct Abdomen Pelvis Wo Contrast  Result Date: 03/27/2017 CLINICAL DATA:  Stage IV non-small cell lung cancer diagnosed in 2016 with prior metastatic disease to the skeleton, liver, and pancreas. Ongoing chemotherapy and hormone therapy. Prostate cancer also diagnosed in 2016. EXAM: CT CHEST, ABDOMEN AND PELVIS WITHOUT CONTRAST TECHNIQUE: Multidetector CT imaging of the chest, abdomen and pelvis was performed following the standard protocol without IV contrast. COMPARISON:  Multiple exams, including 01/17/2017 FINDINGS: CT CHEST FINDINGS Cardiovascular: Right Port-A-Cath tip: Right atrium. Borderline cardiomegaly. Mediastinum/Nodes: 5.3 by 2.9 cm right paratracheal mass on image 19/2, formerly 5.4 by 2.8 cm, with some scattered calcifications, not appreciably changed. Calcified right hilar lymph nodes. An AP window lymph node measures 0.9 cm in short axis on image 24/2, formerly the same. A lymph node posterior to  the right bronchus intermedius measures 1.4 cm in short axis on image 30/2, formerly 1.5 cm by my measurement. Lungs/Pleura: Bilateral posterior pleural thickening appears symmetric and unchanged from prior. Paraseptal and centrilobular emphysema. Calcified left upper lobe pulmonary nodule measures 0.7 by 0.6 cm on image 60/4, stable by my measurements. Musculoskeletal: Avascular necrosis along both humeral heads, with some collapse along the upper margin of the left humeral head as on the prior exam. Scattered faintly sclerotic sternal, rib, scapular, and thoracic spine lesions have a similar appearance and distribution compared to the prior exam. CT ABDOMEN PELVIS FINDINGS Hepatobiliary: Unremarkable Pancreas: Dorsal pancreatic duct dilatation in the pancreatic body and head. Calcifications in the pancreatic head with similar distribution compared to the prior exam, potentially from chronic calcific pancreatitis. Common bile duct mildly prominent at 9 mm. Chronic hypodense lesion in the pancreatic head not appreciably changed compared the prior exam. Spleen: Unremarkable Adrenals/Urinary Tract: Duplicated right renal collecting system. Adrenal glands normal. Stomach/Bowel: There is some air fluid levels in the distal colon which can be seen in setting of diarrheal process. Vascular/Lymphatic: Aortoiliac atherosclerotic vascular disease. There is some indistinct soft tissue density in the vicinity of the aortic bifurcation and left common iliac artery, similar prior, but without distinctly measurable adenopathy. A right external iliac node measures 8 mm in short axis on image 104/2, stable. Reproductive: Unremarkable Other: No supplemental non-categorized findings. Musculoskeletal: Markedly severe degenerative bilateral hip arthropathy. Scattered sclerotic osseous metastatic disease a similar pattern to the prior exam. Increased conspicuity of a Schmorl's node along the inferior endplate of L3. Direct left inguinal  hernia contains adipose tissue. IMPRESSION: 1. Similar degree of widespread osseous metastatic disease. Stable large right upper paratracheal nodal mass and stable adenopathy posterior to the bronchus intermedius. No overtly pathologic adenopathy in the abdomen or pelvis. 2. Stable appearance of hypodense pancreatic head lesion with calcific elements, and dorsal pancreatic duct dilatation in the pancreatic body and head. Mild extrahepatic biliary dilatation. 3. Stable bilateral posterior pleural thickening. Paraseptal and centrilobular emphysema. 4. Severe degenerative arthropathy in both hips. AVN of the humeral heads, left greater than right, with some chronic collapse of the left humeral head. 5. Duplicated right renal collecting system without hydronephrosis or scarring. 6. Air- fluid levels in the distal colon can be seen in setting of diarrheal process. 7.  Aortic Atherosclerosis (ICD10-I70.0). Electronically Signed   By: Van Clines M.D.   On: 03/27/2017 10:19   Ct Chest Wo Contrast  Result Date: 03/27/2017 CLINICAL DATA:  Stage IV non-small cell lung  cancer diagnosed in 2016 with prior metastatic disease to the skeleton, liver, and pancreas. Ongoing chemotherapy and hormone therapy. Prostate cancer also diagnosed in 2016. EXAM: CT CHEST, ABDOMEN AND PELVIS WITHOUT CONTRAST TECHNIQUE: Multidetector CT imaging of the chest, abdomen and pelvis was performed following the standard protocol without IV contrast. COMPARISON:  Multiple exams, including 01/17/2017 FINDINGS: CT CHEST FINDINGS Cardiovascular: Right Port-A-Cath tip: Right atrium. Borderline cardiomegaly. Mediastinum/Nodes: 5.3 by 2.9 cm right paratracheal mass on image 19/2, formerly 5.4 by 2.8 cm, with some scattered calcifications, not appreciably changed. Calcified right hilar lymph nodes. An AP window lymph node measures 0.9 cm in short axis on image 24/2, formerly the same. A lymph node posterior to the right bronchus intermedius measures  1.4 cm in short axis on image 30/2, formerly 1.5 cm by my measurement. Lungs/Pleura: Bilateral posterior pleural thickening appears symmetric and unchanged from prior. Paraseptal and centrilobular emphysema. Calcified left upper lobe pulmonary nodule measures 0.7 by 0.6 cm on image 60/4, stable by my measurements. Musculoskeletal: Avascular necrosis along both humeral heads, with some collapse along the upper margin of the left humeral head as on the prior exam. Scattered faintly sclerotic sternal, rib, scapular, and thoracic spine lesions have a similar appearance and distribution compared to the prior exam. CT ABDOMEN PELVIS FINDINGS Hepatobiliary: Unremarkable Pancreas: Dorsal pancreatic duct dilatation in the pancreatic body and head. Calcifications in the pancreatic head with similar distribution compared to the prior exam, potentially from chronic calcific pancreatitis. Common bile duct mildly prominent at 9 mm. Chronic hypodense lesion in the pancreatic head not appreciably changed compared the prior exam. Spleen: Unremarkable Adrenals/Urinary Tract: Duplicated right renal collecting system. Adrenal glands normal. Stomach/Bowel: There is some air fluid levels in the distal colon which can be seen in setting of diarrheal process. Vascular/Lymphatic: Aortoiliac atherosclerotic vascular disease. There is some indistinct soft tissue density in the vicinity of the aortic bifurcation and left common iliac artery, similar prior, but without distinctly measurable adenopathy. A right external iliac node measures 8 mm in short axis on image 104/2, stable. Reproductive: Unremarkable Other: No supplemental non-categorized findings. Musculoskeletal: Markedly severe degenerative bilateral hip arthropathy. Scattered sclerotic osseous metastatic disease a similar pattern to the prior exam. Increased conspicuity of a Schmorl's node along the inferior endplate of L3. Direct left inguinal hernia contains adipose tissue.  IMPRESSION: 1. Similar degree of widespread osseous metastatic disease. Stable large right upper paratracheal nodal mass and stable adenopathy posterior to the bronchus intermedius. No overtly pathologic adenopathy in the abdomen or pelvis. 2. Stable appearance of hypodense pancreatic head lesion with calcific elements, and dorsal pancreatic duct dilatation in the pancreatic body and head. Mild extrahepatic biliary dilatation. 3. Stable bilateral posterior pleural thickening. Paraseptal and centrilobular emphysema. 4. Severe degenerative arthropathy in both hips. AVN of the humeral heads, left greater than right, with some chronic collapse of the left humeral head. 5. Duplicated right renal collecting system without hydronephrosis or scarring. 6. Air- fluid levels in the distal colon can be seen in setting of diarrheal process. 7.  Aortic Atherosclerosis (ICD10-I70.0). Electronically Signed   By: Van Clines M.D.   On: 03/27/2017 10:19   Mr Thoracic Spine W Wo Contrast  Result Date: 04/01/2017 CLINICAL DATA:  Mid and upper back pain radiating to legs after coughing 2 weeks ago. History of metastatic lung cancer with known spine metastasis. EXAM: MRI THORACIC AND LUMBAR SPINE WITHOUT AND WITH CONTRAST TECHNIQUE: Multiplanar and multiecho pulse sequences of the thoracic and lumbar spine were obtained without  and with intravenous contrast. CONTRAST:  71m MULTIHANCE GADOBENATE DIMEGLUMINE 529 MG/ML IV SOLN COMPARISON:  CT chest, abdomen and pelvis March 27, 2017 FINDINGS: MRI THORACIC SPINE FINDINGS ALIGNMENT: Maintenance of the thoracic kyphosis. No malalignment. VERTEBRAE/DISCS: Diffuse osseous metastasis with heterogeneous T2, and heterogeneous enhancement. Focal edema T10 inferior endplate without height loss. Old T3 superior endplate Schmorl's node. Intervertebral discs demonstrate normal morphology and signal. No suspicious disc enhancement. CORD: Thoracic spinal cord is normal morphology and signal  characteristics, no abnormal cord, leptomeningeal or epidural enhancement. PREVERTEBRAL AND PARASPINAL SOFT TISSUES: Nonacute. RIGHT paratracheal mass incompletely characterized corresponding to known lymphadenopathy. DISC LEVELS: No disc bulge, canal stenosis nor neural foraminal narrowing at any level. MRI LUMBAR SPINE FINDINGS SEGMENTATION: For the purposes of this report, the last well-formed intervertebral disc will be reported as L5-S1. ALIGNMENT: Maintained lumbar lordosis. No malalignment. VERTEBRAE:Diffuse osseous metastasis with heterogeneous T2, and heterogeneous enhancement. L1 superior endplate compression fracture with less than 25% height loss and linear enhancement. Intervertebral disc heights preserved with mild desiccation L3-4 and L4-5 discs. No suspicious disc enhancement. Osseous metastasis included sacrum and iliac bones. CONUS MEDULLARIS: Conus medullaris terminates at L1-2 and demonstrates normal morphology and signal characteristics. Cauda equina is normal. No abnormal cord, leptomeningeal or epidural enhancement. PARASPINAL AND SOFT TISSUES: Included prevertebral and paraspinal soft tissues are normal. DISC LEVELS: T12-L1 thru L2-3: No disc bulge, canal stenosis nor neural foraminal narrowing. L3-4: Annular bulging. Tumoral expansion RIGHT L3 pedicle without canal stenosis. Mild RIGHT greater than LEFT neural foraminal narrowing. L4-5: Small broad-based disc bulge. Minimal facet arthropathy without canal stenosis. Mild neural foraminal narrowing. Trace facet effusions are likely reactive. L5-S1: No disc bulge, canal stenosis nor neural foraminal narrowing. IMPRESSION: MRI thoracic spine: 1. Diffuse osseous metastasis. T10 inferior endplate acute Schmorl's node versus pathologic fracture without height loss. 2. No canal stenosis or neural foraminal narrowing. MRI lumbar spine: 1. Diffuse osseous metastasis. Mild acute L1 superior endplate compression fracture, possibly pathologic. 2. No  canal stenosis. It mild L3-4 and L4-5 neural foraminal narrowing. Electronically Signed   By: CElon AlasM.D.   On: 04/01/2017 19:09   Mr Lumbar Spine W Wo Contrast  Result Date: 04/01/2017 CLINICAL DATA:  Mid and upper back pain radiating to legs after coughing 2 weeks ago. History of metastatic lung cancer with known spine metastasis. EXAM: MRI THORACIC AND LUMBAR SPINE WITHOUT AND WITH CONTRAST TECHNIQUE: Multiplanar and multiecho pulse sequences of the thoracic and lumbar spine were obtained without and with intravenous contrast. CONTRAST:  828mMULTIHANCE GADOBENATE DIMEGLUMINE 529 MG/ML IV SOLN COMPARISON:  CT chest, abdomen and pelvis March 27, 2017 FINDINGS: MRI THORACIC SPINE FINDINGS ALIGNMENT: Maintenance of the thoracic kyphosis. No malalignment. VERTEBRAE/DISCS: Diffuse osseous metastasis with heterogeneous T2, and heterogeneous enhancement. Focal edema T10 inferior endplate without height loss. Old T3 superior endplate Schmorl's node. Intervertebral discs demonstrate normal morphology and signal. No suspicious disc enhancement. CORD: Thoracic spinal cord is normal morphology and signal characteristics, no abnormal cord, leptomeningeal or epidural enhancement. PREVERTEBRAL AND PARASPINAL SOFT TISSUES: Nonacute. RIGHT paratracheal mass incompletely characterized corresponding to known lymphadenopathy. DISC LEVELS: No disc bulge, canal stenosis nor neural foraminal narrowing at any level. MRI LUMBAR SPINE FINDINGS SEGMENTATION: For the purposes of this report, the last well-formed intervertebral disc will be reported as L5-S1. ALIGNMENT: Maintained lumbar lordosis. No malalignment. VERTEBRAE:Diffuse osseous metastasis with heterogeneous T2, and heterogeneous enhancement. L1 superior endplate compression fracture with less than 25% height loss and linear enhancement. Intervertebral disc heights preserved with mild desiccation  L3-4 and L4-5 discs. No suspicious disc enhancement. Osseous metastasis  included sacrum and iliac bones. CONUS MEDULLARIS: Conus medullaris terminates at L1-2 and demonstrates normal morphology and signal characteristics. Cauda equina is normal. No abnormal cord, leptomeningeal or epidural enhancement. PARASPINAL AND SOFT TISSUES: Included prevertebral and paraspinal soft tissues are normal. DISC LEVELS: T12-L1 thru L2-3: No disc bulge, canal stenosis nor neural foraminal narrowing. L3-4: Annular bulging. Tumoral expansion RIGHT L3 pedicle without canal stenosis. Mild RIGHT greater than LEFT neural foraminal narrowing. L4-5: Small broad-based disc bulge. Minimal facet arthropathy without canal stenosis. Mild neural foraminal narrowing. Trace facet effusions are likely reactive. L5-S1: No disc bulge, canal stenosis nor neural foraminal narrowing. IMPRESSION: MRI thoracic spine: 1. Diffuse osseous metastasis. T10 inferior endplate acute Schmorl's node versus pathologic fracture without height loss. 2. No canal stenosis or neural foraminal narrowing. MRI lumbar spine: 1. Diffuse osseous metastasis. Mild acute L1 superior endplate compression fracture, possibly pathologic. 2. No canal stenosis. It mild L3-4 and L4-5 neural foraminal narrowing. Electronically Signed   By: Elon Alas M.D.   On: 04/01/2017 19:09   ASSESSMENT AND PLAN:  This is a very pleasant 56 years old African-American male with metastatic non-small cell lung cancer, adenocarcinoma status post induction systemic chemotherapy was carboplatin and Alimta. He was started on second line treatment with Nivolumab 240 mg IV every 2 weeks is status post 48 cycles. He has been tolerating this treatment fairly well. The patient drives a long distance to come for the infusion every 2 weeks. I recommended for him to change the dose and frequency of Nivolumab to 480 mg every 4 weeks for convenience. He will start the first dose of this treatment today. For pain management, I gave him refill for Percocet 1 tablet every 6  hours #90 today. The patient was advised to call immediately if she has any concerning symptoms in the interval. The patient voices understanding of current disease status and treatment options and is in agreement with the current care plan. All questions were answered. The patient knows to call the clinic with any problems, questions or concerns. We can certainly see the patient much sooner if necessary.  Disclaimer: This note was dictated with voice recognition software. Similar sounding words can inadvertently be transcribed and may not be corrected upon review.

## 2017-04-10 NOTE — Progress Notes (Signed)
Glucose 69 today, pt ate cheese and drank juice in tx area. Pt is stable, asymptomatic.   Per Dr Julien Nordmann ok to tx with today's labs.

## 2017-04-10 NOTE — Telephone Encounter (Signed)
Scheduled appt pe r8/22 los - Gave patient AVS and calender per los.  

## 2017-04-15 ENCOUNTER — Ambulatory Visit
Admission: RE | Admit: 2017-04-15 | Discharge: 2017-04-15 | Attending: Student in an Organized Health Care Education/Training Program

## 2017-04-15 DIAGNOSIS — L299 Pruritus, unspecified: Principal | ICD-10-CM

## 2017-04-15 DIAGNOSIS — N480 Leukoplakia of penis: Secondary | ICD-10-CM

## 2017-04-15 DIAGNOSIS — L280 Lichen simplex chronicus: Secondary | ICD-10-CM

## 2017-04-15 MED ORDER — BETAMETHASONE DIPROPIONATE 0.05 % TOPICAL OINTMENT
2 refills | 0 days | Status: CP
Start: 2017-04-15 — End: ?

## 2017-04-15 NOTE — Unmapped (Addendum)
Lichen Simplex Chronicus: Care Instructions  Your Care Instructions  Lichen simplex chronicus is a skin problem that starts with a very itchy patch of skin. If you have been rubbing or scratching your skin a lot, the patch of skin can get thicker and may look like leather. For some people, the patch is also painful.  Many people find that having this condition can be stressful. Or they find that stress makes their symptoms worse. Reducing stress, or finding ways to cope with it, may help reduce the itching.  Your doctor may try to find out what's causing your itchy skin. Causes can include conditions such as psoriasis or eczema. Or it may have started if you had an insect bite that was itchy.  To treat lichen simplex chronicus, your doctor will try to stop the itching. He or she may prescribe prescription cream or ointment. The medicine stops the itching. But if scars have formed, they may not go away completely.  If another problem is causing the itching, your doctor may suggest separate treatment for that problem.  Follow-up care is a key part of your treatment and safety. Be sure to make and go to all appointments, and call your doctor if you are having problems. It's also a good idea to know your test results and keep a list of the medicines you take.  How can you care for yourself at home?  ?? Be safe with medicines. If your doctor prescribed a cream or ointment, apply it exactly as directed. Call your doctor if you think you are having a problem with your medicine.  ?? If itching affects your normal activities, take an over-the-counter antihistamine, such as diphenhydramine (Benadryl) or loratadine (Claritin). It will help calm the itching. Read and follow all instructions on the label.  ?? Wash the affected area with water only. Soap can make itching worse. Gently pat the area dry.  ?? Apply a moisturizer after bathing. Put it on while your skin is still damp after lightly drying with a towel.Use a cream such as Lubriderm, Moisturel, or Cetaphil that does not irritate the skin or cause a rash.  ?? Cover the area with a nonstick bandage to help protect it.  ?? Put cold, wet cloths on the area to reduce itching.  ?? Try to keep cool. Heat can make itching worse.  ?? If you have a lot of stress in your life, talk with your doctor about how you're feeling and how stress may be affecting you.  When should you call for help?  Call your doctor now or seek immediate medical care if:  ?? ?? You have signs of infection, such as:  ?? Increased pain, swelling, warmth, or redness.  ?? Red streaks leading from the area.  ?? Pus draining from the area.  ?? A fever.   ??Watch closely for changes in your health, and be sure to contact your doctor if:  ?? ?? The area does not clear up after 4 to 6 weeks.   ?? ?? You do not get better as expected.   Where can you learn more?  Go to [facility] at [email or web address]  Select Preferences in the upper right hand corner, then select Health Library under Resources. Enter [phone] in the search box to learn more about Lichen Simplex Chronicus: Care Instructions.  Current as of: May 24, 2016  Content Version: 11.7  ?? 2006-2018 [employer]. Care instructions adapted under license by [facility]. If you have questions about a  medical condition or this instruction, always ask your healthcare professional. [employer] disclaims any warranty or liability for your use of this information.

## 2017-04-15 NOTE — Unmapped (Signed)
Assessment and Plan:    Lichen sclerosis (balanitis xerotica obliterans)  Atrophic and eroded patches with crusting. Past workup included penile biopsy, which was negative for malignancy and showed lichen planus-like keratosis and may represent some early changes of lichen sclerosus Will treat with betamethasone 0.05% ointment daily for up to 4 weeks. If no improvement at follow up visit in 6 weeks, may consider further workup and possible repeat biopsy.     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 Will treat this with betamethasone, as well. Instructed him to not apply continuously for more than a few weeks.    RTC: Return in about 6 weeks (around 05/27/2017) for Follow Up. or  sooner as needed.      CC:   Annual FBSE and Med Refills    Chief Complaint   Patient presents with   ??? Eczema     Patient states that eczema is getting better overall. patient states he is having trouble with spot in his groin. itches a lot. Uses triam ointment and soap  (needs refill)        HPI:  This is a pleasant 56 y.o. male, who presents today to discuss a non-healing penile ulcer present for a few years and groin itching present for a few months. He reports intermittent rashes in varying places on body and triamcinolone is partially helpful. He is not taking any OTC antihistamines and is not using lotion, as he states these were ineffective in the past.   He is on chemo, but no recent changes.  Is bedridden most of the day.     The patient does not have a known history of skin cancer, and denies any other new or changing lesions or areas of concern.     Lesion(s) of concern today include:  Penile ulcer improved, but he feels like something is stuck on it. He also notes groin itching for a few months.   ?? Past workup/assessment:   -most c/w chronic herpes and underlying?? lichen sclerosus or lichen planus?? ... Cannot r/o SCC, but bx x 2 negative??and negative HSV swabs in the past.  Dramatic improvement on valtrex and doxy orally and clobetasol topically.             Derm PMH:   Penile ulcer     ROS: No other systemic or skin complaints other than as documented in the HPI/PMH.  The balance of 10 systems is negative unless otherwise documented.   No other skin complaints except noted per HPI.     PE:  Gen: Well-developed, well-nourished male, in no acute distress.   Neuro: Alert and oriented, answers questions appropriately.  Skin: Examination performed included: Focal-Per patient request: Focal examination of groin was performed and is notable for the following:  -Lichenification of skin in inguinal folds and inner thighs, posterior scrotum  -Crusting, non-erythematous, slightly hypopigmented lesion on superior aspect of glans.  There is some blunting of the transition from glans to shaft and some focal areas that are atrophic on the glans

## 2017-04-17 NOTE — Unmapped (Signed)
Rx refill request for doxy 100mg 

## 2017-04-18 MED ORDER — DOXYCYCLINE HYCLATE 100 MG TABLET
ORAL_TABLET | 0 refills | 0.00000 days | Status: CP
Start: 2017-04-18 — End: 2017-05-21

## 2017-04-19 IMAGING — CT CT ABD-PELV W/O CM
2 of 4 series · 15 of 46 positions shown, 17 images · non-contrast
Comparison: 03/11/2015.

CLINICAL DATA: Lung cancer, chemotherapy in progress, bone
metastases, shortness of breath.

EXAM:
CT CHEST, ABDOMEN AND PELVIS WITHOUT CONTRAST
TECHNIQUE: Multidetector CT imaging of the chest, abdomen and pelvis was
performed following the standard protocol without IV contrast.

[Series 2: cap w/o w/o st · axial · non-contrast · 0.75mm/px · z∈[-592,-37]mm · 12 of 125 slices shown, 14 images]
[im 7/125  soft-tissue]
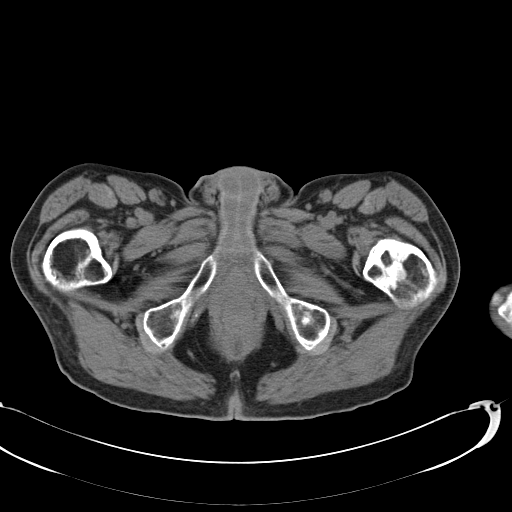
[im 7/125  bone]
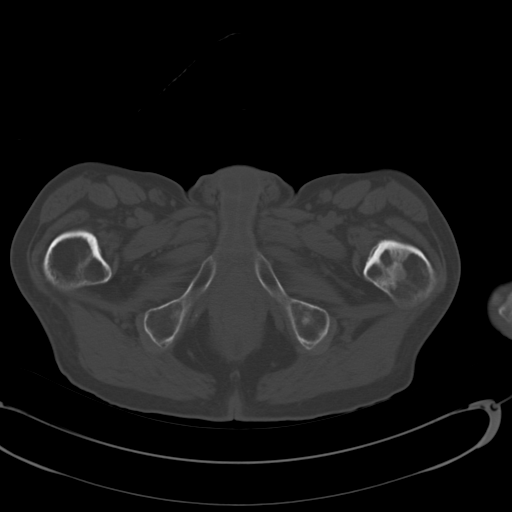
[im 19/125  soft-tissue]
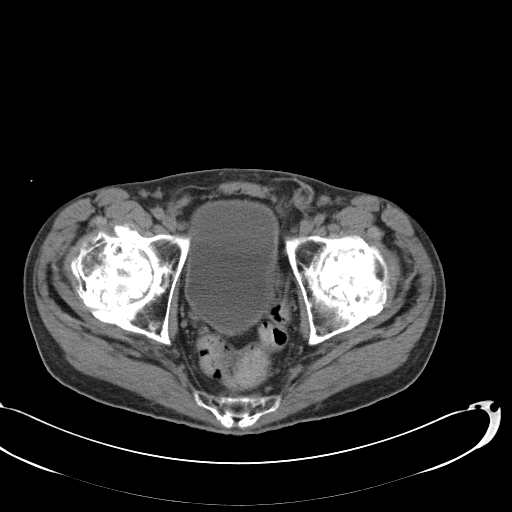
[im 25/125  soft-tissue]
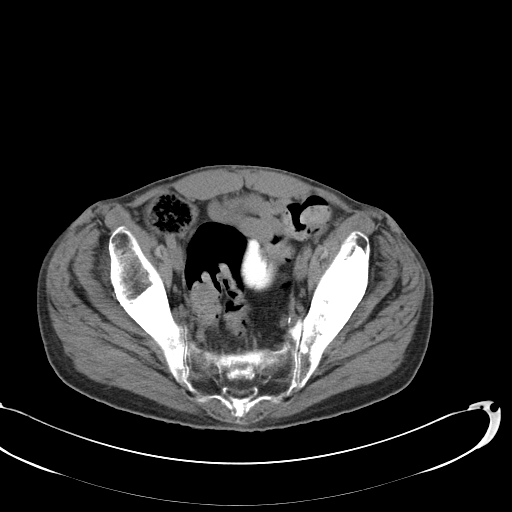
[im 38/125  soft-tissue]
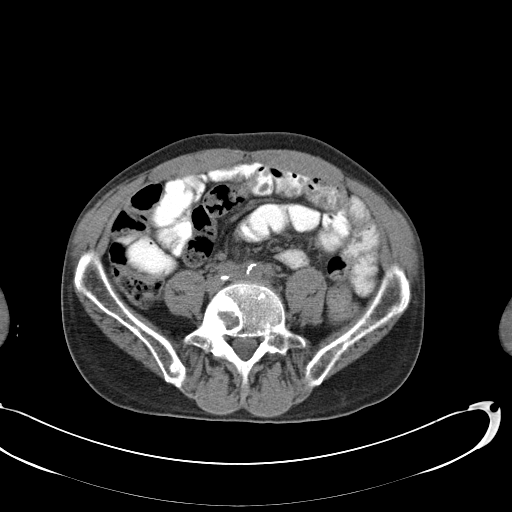
[im 50/125  soft-tissue]
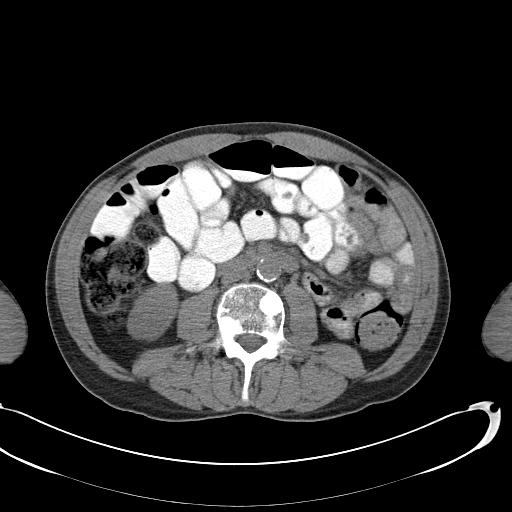
[im 56/125  soft-tissue]
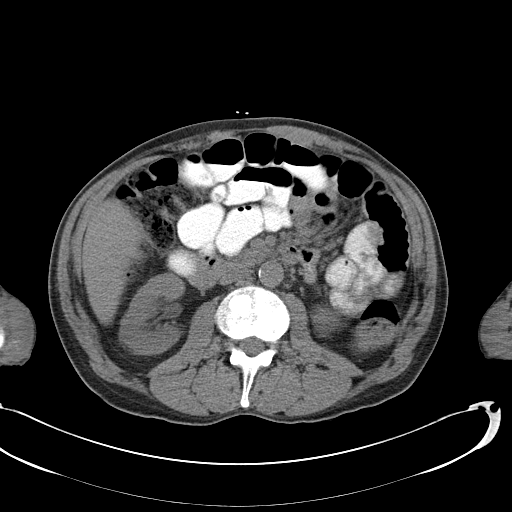
[im 69/125  soft-tissue]
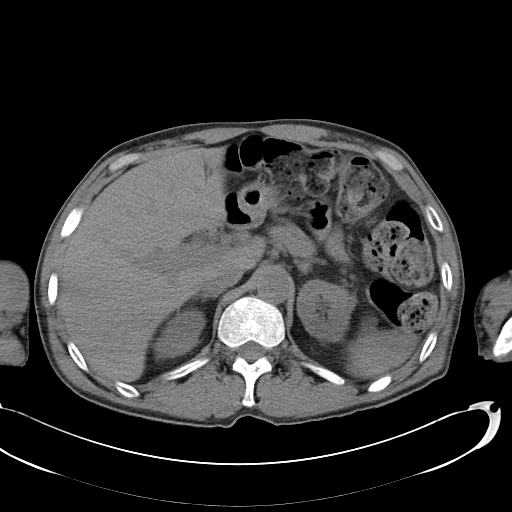
[im 75/125  soft-tissue]
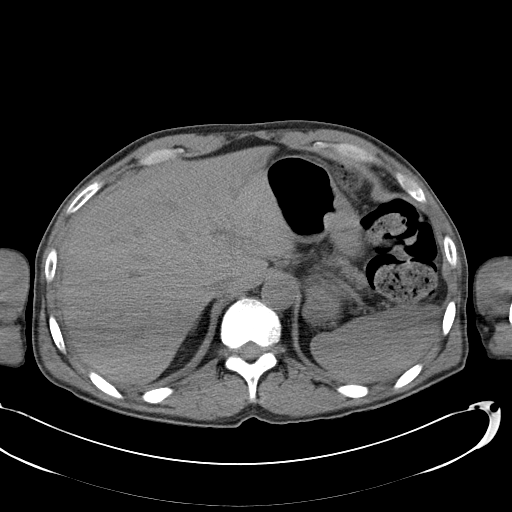
[im 87/125  soft-tissue]
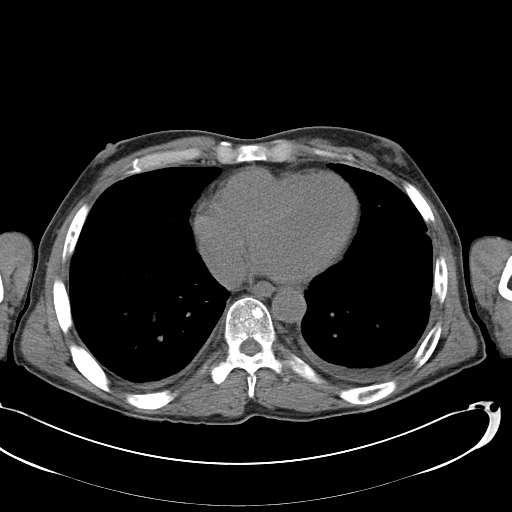
[im 87/125  bone]
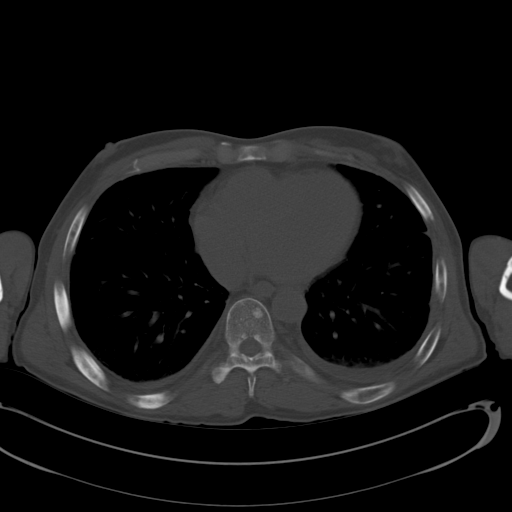
[im 100/125  soft-tissue]
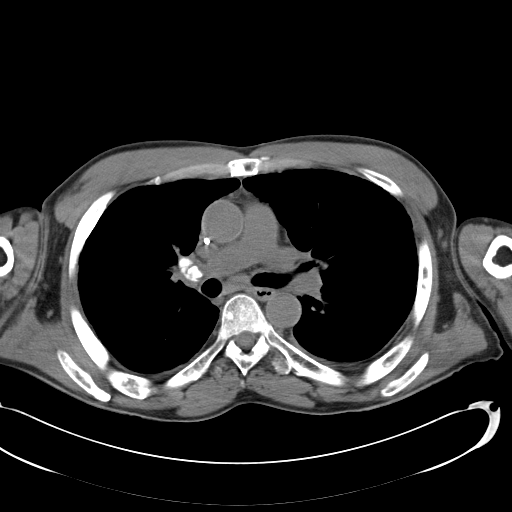
[im 106/125  soft-tissue]
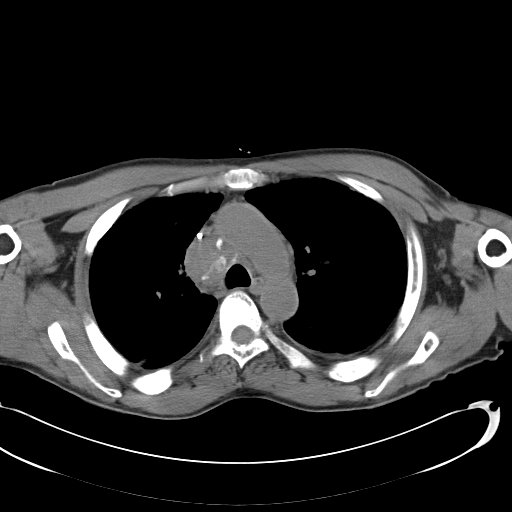
[im 118/125  soft-tissue]
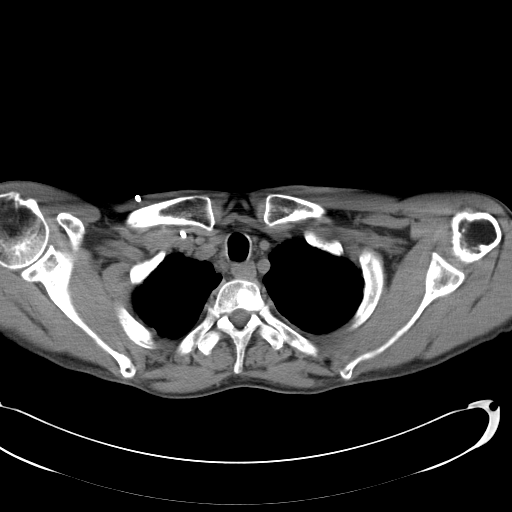

[Series 602: <mpr thick range> · coronal · 1.22mm/px · 3 of 82 slices shown]
[im 28/82  soft-tissue]
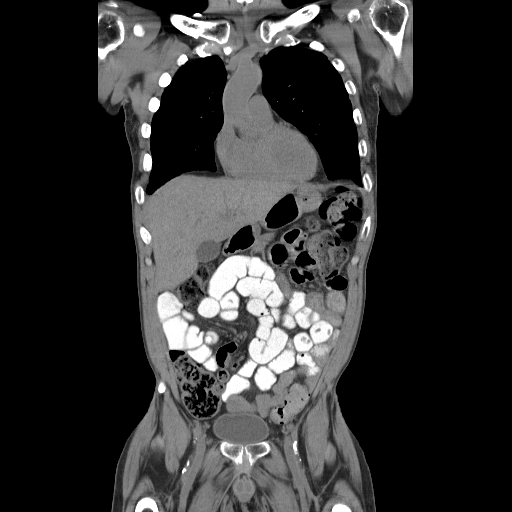
[im 37/82  soft-tissue]
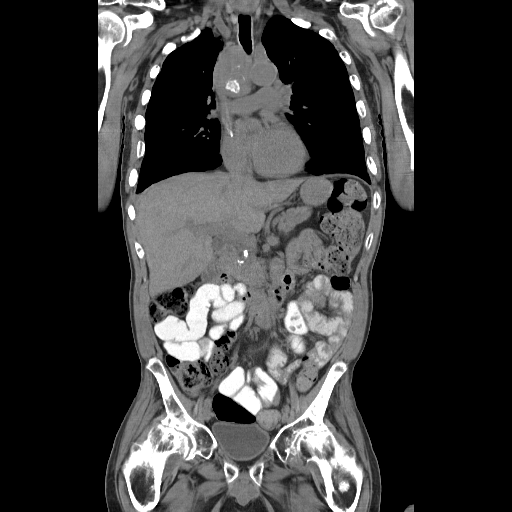
[im 46/82  soft-tissue]
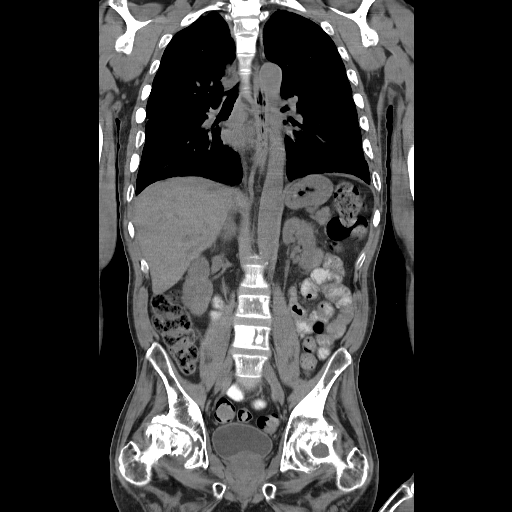

[15 of 46 positions shown; findings below may reference images not displayed]

FINDINGS: CT CHEST FINDINGS

Mediastinum/Lymph Nodes: Right IJ Port-A-Cath terminates in the
right atrium. Partially calcified mediastinal adenopathy measures up
to 3.0 cm in short axis in the right paratracheal station (series 2,
image 16), previously 4.2 cm. Subcarinal lymph node is stable,
cm. Right hilar adenopathy is difficult to accurately measure
without IV contrast but measures approximately 1.7 cm (image 28),
stable. No axillary adenopathy. Heart size normal. Coronary artery
calcification. No pericardial effusion.

Lungs/Pleura: Tiny bilateral effusions. Biapical pleural parenchymal
scarring with mild centrilobular and paraseptal emphysema. A few
scattered nodular densities measure up to 5 mm in the right lower
lobe (series 4, image 33). The measured nodule is not definitely
seen on 03/11/2015. Airway is unremarkable.

Musculoskeletal: Diffuse sclerotic osseous metastatic disease is
grossly stable. Vertebral body height is maintained.

CT ABDOMEN PELVIS FINDINGS

Hepatobiliary: Liver and gallbladder are unremarkable. Bile duct
measures approximately 9 mm, stable.

Pancreas: Partially calcified cystic lesion in the head of the
pancreas is suboptimally visualized without IV contrast, making
accurate measurement difficult. Pancreatic duct is visualized but
not dilated. No gland atrophy.

Spleen: Negative.

Adrenals/Urinary Tract: Adrenal glands and right kidney are
unremarkable. Left kidney appears slightly atrophic. Ureters are
decompressed. Bladder is unremarkable.

Stomach/Bowel: There may be a tiny hiatal hernia. Stomach is
decompressed. Mild prominence of small bowel loops, without evidence
of obstruction. Appendix is not well visualized. Stool is seen
throughout the colon. Suspect rectal prolapse.

Vascular/Lymphatic: Atherosclerotic calcification of the arterial
vasculature without abdominal aortic aneurysm. Known severe stenosis
of the left common iliac artery is poorly visualized without IV
contrast. No pathologically enlarged lymph nodes.

Reproductive: Prostate is visualized.

Other: Small bilateral inguinal hernias contain fat, left greater
than right. No free fluid. Mesenteries and peritoneum are otherwise
unremarkable.

Musculoskeletal: Diffuse sclerotic metastatic disease is grossly
unchanged. Severe bilateral hip osteoarthritis. Question underlying
avascular necrosis bilaterally with associated collapse in both
femoral heads.
IMPRESSION: 1. Slight decrease in size of a right paratracheal nodal mass.
Mediastinal/right hilar adenopathy and osseous metastatic disease
are otherwise grossly stable.
2. 5 mm right lower lobe nodule is not well-visualized on the prior
exam. Continued attention on followup exams is warranted.
3. Tiny bilateral pleural effusions.
4. Partial calcified cystic lesion in the pancreatic head is
suboptimally visualized without IV contrast, making accurate
measurement and comparison difficult.

## 2017-04-23 ENCOUNTER — Ambulatory Visit
Admission: RE | Admit: 2017-04-23 | Discharge: 2017-05-01 | Disposition: A | Discharge status: Home / Self Care | Payer: MEDICARE | Attending: Hematology & Oncology | Admitting: Hematology & Oncology

## 2017-04-23 ENCOUNTER — Ambulatory Visit
Admission: RE | Admit: 2017-04-23 | Discharge: 2017-05-01 | Disposition: A | Discharge status: Home / Self Care | Payer: MEDICARE

## 2017-04-23 DIAGNOSIS — C7982 Secondary malignant neoplasm of genital organs: Principal | ICD-10-CM

## 2017-04-23 DIAGNOSIS — C61 Malignant neoplasm of prostate: Principal | ICD-10-CM

## 2017-04-23 DIAGNOSIS — C7951 Secondary malignant neoplasm of bone: Secondary | ICD-10-CM

## 2017-04-23 LAB — EGFR MDRD NON AF AMER
Glomerular filtration rate/1.73 sq M.predicted.non black:ArVRat:Pt:Ser/Plas/Bld:Qn:Creatinine-based formula (MDRD): 33 — ABNORMAL LOW

## 2017-04-23 LAB — CBC W/ AUTO DIFF
BASOPHILS ABSOLUTE COUNT: 0.1 10*9/L (ref 0.0–0.1)
EOSINOPHILS ABSOLUTE COUNT: 0.5 10*9/L — ABNORMAL HIGH (ref 0.0–0.4)
HEMATOCRIT: 35.7 % — ABNORMAL LOW (ref 41.0–53.0)
HEMOGLOBIN: 11.7 g/dL — ABNORMAL LOW (ref 13.5–17.5)
LARGE UNSTAINED CELLS: 3 % (ref 0–4)
LYMPHOCYTES ABSOLUTE COUNT: 1.5 10*9/L (ref 1.5–5.0)
MEAN CORPUSCULAR HEMOGLOBIN CONC: 32.6 g/dL (ref 31.0–37.0)
MEAN CORPUSCULAR VOLUME: 101.6 fL — ABNORMAL HIGH (ref 80.0–100.0)
MEAN PLATELET VOLUME: 9 fL (ref 7.0–10.0)
MONOCYTES ABSOLUTE COUNT: 1 10*9/L — ABNORMAL HIGH (ref 0.2–0.8)
NEUTROPHILS ABSOLUTE COUNT: 6.5 10*9/L (ref 2.0–7.5)
PLATELET COUNT: 284 10*9/L (ref 150–440)
RED BLOOD CELL COUNT: 3.52 10*12/L — ABNORMAL LOW (ref 4.50–5.90)
RED CELL DISTRIBUTION WIDTH: 19.9 % — ABNORMAL HIGH (ref 12.0–15.0)
WBC ADJUSTED: 9.7 10*9/L (ref 4.5–11.0)

## 2017-04-23 LAB — PROSTATE SPECIFIC ANTIGEN: Prostate specific Ag:MCnc:Pt:Ser/Plas:Qn:: 64.2 — ABNORMAL HIGH

## 2017-04-23 LAB — PLATELET COUNT: Lab: 284

## 2017-04-23 LAB — SMEAR REVIEW

## 2017-04-23 LAB — CREATININE: EGFR MDRD NON AF AMER: 33 mL/min/{1.73_m2} — ABNORMAL LOW (ref >=60)

## 2017-04-23 MED ORDER — OXYCODONE-ACETAMINOPHEN 5 MG-325 MG TABLET
ORAL_TABLET | ORAL | 0 refills | 0 days | Status: CP | PRN
Start: 2017-04-23 — End: 2017-05-21

## 2017-04-23 NOTE — Unmapped (Signed)
Two tabs Percocet administered per order

## 2017-04-23 NOTE — Unmapped (Signed)
Referral to Outpatient Oncology Palliative Care Clinic ([facility])     04/23/17: NEW pt referral to Outpatient Palliative Care Clinic [facility]) from Dr. Philomena Course.     Reason for referral: pain management and EOL planning.     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  MRI at [facility] 03/2017 showed, diffuse osseous mets, possible T10 pathologic fracture without height loss. See other scan results in 04/23/17 clinic note.    Patient also has stage IV nonsmall cell lung cancer, on secondline therapy with nivolumab s/p cycle #48 (q2w)--managed by local oncologist in [place].   ??  PSA has increased from 50.7 to 64.2 since his last visit. Primary team had a frank discussion regarding expected prognosis moving forward in on the order of months to one year, but for now will continue with radium treatment for total of 6 doses, enzalutamide, and lupron.    Proceed with Radium 223 infusion.     Pt reports worsening back pain although MRI findings are reassuring. Percocet prescribed. Primary team requests palliative medicine to assist with pain management and end-of-life care moving forward. Pain medication has historically been prescribed by local oncologist but Dr Philomena Course requests pain mng to be mng by [facility] palliative care and for [facility] pall care to see for EOL planning.     Plan:Marland Kitchen Appointment request sent to [facility] scheduler for next, new patient available appointment.        Allegra Lai RN BSN OCN MS  RN Clinical Coordinator  Outpatient Oncology Palliative Care ([facility])  [facility]  Pager: [phone]  Phone: [phone]

## 2017-04-23 NOTE — Unmapped (Signed)
Lab Results   Component Value Date    PSA 64.20 (H) 04/23/2017    PSA 50.70 (H) 03/26/2017    PSA 67.60 (H) 02/21/2017    PSA 60.80 (H) 01/10/2017    PSA 28.10 (H) 10/11/2016    PSA 24.90 (H) 08/30/2016   PSA has been relatively stable.  Continue with Radium 223 infusion today.    Please call [phone] to reach my nurse navigator Mauricia Area for any issues.    For emergencies on Nights, Weekends and Holidays  Call [phone] and ask for the hematology/oncology on call.    Griffin Basil, MD, PhD  Associate Professor of Medicine  Division of Hematology-Oncology    [facility]  Genitourinary Oncology Clinic  Nurse Navigator: Mauricia Area  Fax: [phone]

## 2017-04-23 NOTE — Unmapped (Signed)
[facility] Visit Note    Patient Name: Bruce Parker  Patient Age: 56 y.o.  Encounter Date: 04/23/2017  Provider: Aram Beecham  Referring physician: Lonie Peak Rindane, *    Assessment  Patient Active Problem List   Diagnosis   ??? Derangement of right patella   ??? Hypertension, benign   ??? Osteoarthrosis   ??? Chronic renal disease, stage 3, moderately decreased glomerular filtration rate (GFR) between 30-59 mL/min/1.73 square meter   ??? Lichen planus   ??? Weakness   ??? Lung mass   ??? Mediastinal lymphadenopathy   ??? Malignant neoplasm metastatic to bone (CMS-HCC)   ??? Pain of metastatic malignancy   ??? Non-small cell carcinoma of lung (CMS-HCC)   ??? Protein-calorie malnutrition (CMS-HCC)   ??? Acute respiratory failure (CMS-HCC)   ??? Shortness of breath   ??? Tobacco dependence syndrome   ??? Prostate cancer (CMS-HCC)     1. Metastatic castration resistant prostate cancer, with bone metastases  2. Stage IV nonsmall cell lung cancer, on secondline therapy with nivolumab s/p cycle #48 (q2w)    On firstline CRPC therapy with enzalutamide. PSA has increased from 50.7 to 64.2 since his last visit. We had a frank discussion regarding expected prognosis moving forward in on the order of months to one year, but for now will continue with radium treatment for total of 6 doses, enzalutamide, and lupron.      Today, he will proceed with Radium 223 infusion #3.  Pt reports worsening back pain although MRI findings are reassuring. We will request palliative medicine to assist with pain management and end-of-life care moving forward.    Plan  1. Proceed with Radium-223 infusion #3 of 6 planned  2. Continue enzalutamide dose to 160 mg qd (regular dose), since he is tolerating well  3. Lupron 22.5 mg IM, given today, 04/23/17, next due 07/23/17 or later  4. For back pain, given 2 pills percocet 5-325 in clinic and refilled percocet to bridge to establishing care with palliative medicine.  We (with palliative care) at [facility] may need to take over pain management from the local oncologist Dr. Rebecca Eaton office, as they seem to just renew Percocet prescription.  The pain is likely from bone mets of prostate cancer and we likely will need to start him on a long-acting narcotic soon.  5. Contacted palliative medicine team to coordinate visit with next follow-up.  6. Continue with stage 4 lung cancer treatment by his local oncologist Dr. Arbutus Ped. On nivolumab, monthly.  7. Return in 4 weeks for next infusion of Radium 223 and palliative appt    This patient was seen and discussed with Dr. Philomena Course.  Erie Noe, MD  Hematology/Oncology Fellow PGY-5    Reason for Visit  Prostate cancer    History of Present Illness:  Oncology History    --In 04/2014, PSA elevated to 56.4. DRE normal.  Prostate bx scheduled, but canceled by pt.  --In 09/2014, diagnosed with nonsmall cell lung cancer, stage IV (T2b, N2, M1b) with a large R hilar mass, mediastinal adenopathy, mets to bones. No uptake in liver or pancreas on PET scan. Insufficient material for molecular testing.  Guardant360 for ctDNA was negative.  Pt was subsequently treated with carboplatin/Alimta with initial good response.  Then disease progression.  On second line therapy with nivolumab.  --In 01/2015, PSA 62.2.  Seen in Urology clinic and referred to [facility] for possibility of prostate cancer.  After discussions about various options, underwent  bone bx in 05/2015, nondiagnostic. PSA down to 23.7  --In 07/2015, prostate bx showed Gleason 4+5=9 adenocarcinoma.  --In 08/2015, androgen deprivation therapy with Lupron started.  PSA 354.  --In 11/2015, Lupron continued. PSA down to 1.7, which was the nadir. Subsequently, PSA began rising.  --In 06/2016, PSA up to 32.  Enzalutamide started.  MRI of brain neg. Bone scan stable.  --In 02/2017, PSA rise on enzalutamide. Bone scan stable. Radium 223 added        Lung cancer, primary, with metastasis from lung to other site (CMS-HCC) (Resolved) 10/11/2014 Initial Diagnosis     Lung cancer, primary, with metastasis from lung to other site [facility])           Prostate cancer (CMS-HCC)    07/27/2015 Initial Diagnosis     Prostate cancer (RAF-HCC)          The patient returns for scheduled follow up.  Patient continues to have back pain, rated as a 8/10 in intensity.  He has run out of his pain medication and is not scheduled to see his local oncologist until the end of the month.  He spends the vast majority of the day on the couch or in bed.  He is mildly short of breath but with supplemental oxygen his symptoms are controlled.  Recent MRI of thoracic and lumbar spine was without spinal cord compression, reviewed report in media tab.    Allergies:  No Known Allergies    Current Medications:    Current Outpatient Prescriptions:   ???  albuterol 2.5 mg /3 mL (0.083 %) nebulizer solution, Inhale 2.5 mg by nebulization every four (4) hours as needed. , Disp: , Rfl:   ???  AMITIZA 24 mcg capsule, Take 24 mcg by mouth daily with breakfast. , Disp: , Rfl:   ???  betamethasone dipropionate (DIPROLENE) 0.05 % ointment, Apply twice daily to affected areas as needed for itching, Disp: 45 g, Rfl: 2  ???  diphenhydrAMINE (BENADRYL) 25 mg tablet, Take 25 mg by mouth every six (6) hours as needed. , Disp: , Rfl:   ???  doxycycline (VIBRA-TABS) 100 MG tablet, Take 1 tablet (100 mg total) by mouth daily., Disp: 30 tablet, Rfl: 0  ???  doxycycline (VIBRA-TABS) 100 MG tablet, TAKE 1 TABLET BY MOUTH ONCE DAILY, Disp: 90 tablet, Rfl: 0  ???  enzalutamide (XTANDI) 40 mg cap capsule, Take 4 capsules (160 mg total) by mouth daily., Disp: 120 capsule, Rfl: 11  ???  fluocinolone 0.01 % external oil, Bid to back, Disp: 120 mL, Rfl: 2  ???  folic acid (FOLVITE) 1 MG tablet, Take 1 mg by mouth., Disp: , Rfl:   ???  food supplemt, lactose-reduced (ENSURE PLUS) 0.05-1.5 gram-kcal/mL liquid, Take 237 mL by mouth daily. , Disp: , Rfl:   ???  furosemide (LASIX) 20 MG tablet, Take 20 mg by mouth., Disp: , Rfl:   ??? lactose-reduced food (ENSURE ENLIVE ORAL), Take 237 mL by mouth., Disp: , Rfl:   ???  levocetirizine (XYZAL) 5 MG tablet, Take 5 mg by mouth., Disp: , Rfl:   ???  lidocaine-prilocaine (EMLA) cream, Place on port site the morning of chemo, Disp: , Rfl:   ???  magnesium oxide (MAG-OX) 400 mg tablet, Take 400 mg by mouth., Disp: , Rfl:   ???  metoprolol tartrate (LOPRESSOR) 25 MG tablet, Take 12.5 mg by mouth., Disp: , Rfl:   ???  mirtazapine (REMERON) 15 MG tablet, Take 15 mg by mouth nightly. , Disp: ,  Rfl:   ???  oxyCODONE-acetaminophen (PERCOCET) 5-325 mg per tablet, Take 1 tablet by mouth every four (4) hours as needed for pain., Disp: 100 tablet, Rfl: 0  ???  polyethylene glycol (MIRALAX) 17 gram packet, Take 17 g by mouth daily. , Disp: , Rfl:   ???  prochlorperazine (COMPAZINE) 10 MG tablet, Take 10 mg by mouth every eight (8) hours as needed. , Disp: , Rfl:   ???  senna-docusate (PERICOLACE) 8.6-50 mg, Take 1 tablet by mouth., Disp: , Rfl:   ???  sodium bicarbonate 650 mg tablet, Take 650 mg by mouth., Disp: , Rfl:   ???  SUMAtriptan (IMITREX) 25 MG tablet, , Disp: , Rfl:   ???  tamsulosin (FLOMAX) 0.4 mg capsule, Take 1 capsule (0.4 mg total) by mouth daily., Disp: 30 capsule, Rfl: 11  ???  triamcinolone (KENALOG) 0.1 % ointment, Apply to arms twice a day until clear, then stop. (Patient taking differently: Apply 1 application topically two (2) times a day as needed. ), Disp: 30 g, Rfl: 1  ???  valACYclovir (VALTREX) 1000 MG tablet, TAKE ONE TABLET BY MOUTH ONCE DAILY, Disp: 90 tablet, Rfl: 0  ???  valACYclovir (VALTREX) 1000 MG tablet, Take 1 tablet (1,000 mg total) by mouth daily., Disp: 30 tablet, Rfl: 0    Past Medical History and Social History  Past Medical History:   Diagnosis Date   ??? Lung cancer (CMS-HCC)    ??? On supplemental oxygen therapy       Past Surgical History:   Procedure Laterality Date   ??? APPENDECTOMY     ??? PR BRNCHSC EBUS GUIDED SAMPL 3/> NODE STATION/STRUX N/A 05/18/2015    Procedure: Bronch, Rigid Or Flexible, Including Fluoro Guidance, When Performed; W Ebus Guided Transtracheal And/Or Transbronchial Sampling, 3 Or More Mediastinal And/Or Hilar Lymph Node Stations Or Structures;  Surgeon: Mathis Bud, MD;  Location: MAIN OR [facility];  Service: Pulmonary   ??? small intestines removed per pt          Social History     Occupational History   ??? Not on file.     Social History Main Topics   ??? Smoking status: Former Smoker     Packs/day: 1.00     Years: 30.00     Types: Cigarettes     Quit date: 05/15/2014   ??? Smokeless tobacco: Never Used   ??? Alcohol use No   ??? Drug use: No   ??? Sexual activity: Not on file   Pt is accompanied by his ?brother-in-law, who usually brings him to clinic    Family History  Brother had lung cancer.    Review of Systems:  A comprehensive review of 10 systems was negative except for pertinent positives noted in HPI.    Physical Exam:    VITAL SIGNS:  BP 114/73  - Pulse 93  - Temp 36.6 ??C (97.9 ??F) (Oral)  - Resp 16  - Wt 75.3 kg (166 lb 1.6 oz)  - SpO2 99%  - BMI 24.52 kg/m??   ECOG Performance Status: 2  GENERAL: Chronically ill-appearing, difficulty sitting up straight  HEAD: Normocephalic and atraumatic.  EYES: Conjunctivae are normal. No scleral icterus.  MOUTH/THROAT: Oropharynx is clear and moist.  No mucosal lesions.  NECK: Supple, no thyromegaly.  LYMPHATICS: No palpable cervical, supraclavicular, or axillary adenopathy.  CARDIOVASCULAR: Normal rate, regular rhythm and normal heart sounds.  Exam reveals no gallop and no friction rub.  No murmur heard.  PULMONARY/CHEST: Posterior inferior  bibasilar crackles  ABDOMINAL:  Soft. There is no distension. There is no tenderness. There is no rebound and no guarding.  MUSCULOSKELETAL: No clubbing, cyanosis, or lower extremity edema.  TTP of thoracic spine.  PSYCHIATRIC: Alert and oriented.  Normal mood and affect.  NEUROLOGIC: No focal motor deficit. Gait not observed.  SKIN: Skin is warm, dry, and intact.      Results/Orders:    Lab on 04/23/2017 Component Date Value Ref Range Status   ??? PSA 04/23/2017 64.20* 0.00 - 4.00 ng/mL Final   ??? Creatinine 04/23/2017 2.06* 0.70 - 1.30 mg/dL Final   ??? EGFR MDRD Af Amer 04/23/2017 40* >=60 mL/min/1.40m2 Final   ??? EGFR MDRD Non Af Amer 04/23/2017 33* >=60 mL/min/1.60m2 Final       PSA   Date Value Ref Range Status   04/23/2017 64.20 (H) 0.00 - 4.00 ng/mL Final   03/26/2017 50.70 (H) 0.00 - 4.00 ng/mL Final   02/21/2017 67.60 (H) 0.00 - 4.00 ng/mL Final   01/10/2017 60.80 (H) 0.00 - 4.00 ng/mL Final   10/11/2016 28.10 (H) 0.00 - 4.00 ng/mL Final   08/30/2016 24.90 (H) 0.00 - 4.00 ng/mL Final   07/19/2016 32.60 (H) 0.00 - 4.00 ng/mL Final   06/21/2016 21.00 (H) 0.00 - 4.00 ng/mL Final   03/15/2016 2.37 0.00 - 4.00 ng/mL Final   12/08/2015 1.72 0.00 - 4.00 ng/mL Final   Baseline PSA prior to enzalutamide is 32.6 on 07/19/2016      Testosterone   Date Value Ref Range Status   06/21/2016 10 (L) 179 - 756 ng/dL Final   16/05/9603 540 (L) 179 - 756 ng/dL Final   98/06/9146 84 (L) 179 - 756 ng/dL Final              Orders placed or performed in visit on 04/23/17   ??? Amb Referral to Palliative Care     Pathology:  Final Diagnosis    ?? A:?? Prostate,?? Right base, needle biopsy ??  - Prostatic adenocarcinoma, Gleason score 4 + 3 = 7 (Grade group 3) involving 2 of 2 cores, approximately 12 and 12 mm in discontinuous linear extent, approximately 80% of total core length.    B:?? Prostate,?? Right mid, needle biopsy   - Prostatic adenocarcinoma, Gleason score 4 + 3 = 7 (Grade group 3) involving 2 of 2 cores, approximately 11 and 7 mm in discontinuous linear extent, approximately 70% of total core length.    C:?? Prostate,?? Right apex, needle biopsy   - Prostatic adenocarcinoma, Gleason score 3 + 5 = 8 (Grade group 4) involving 2 of 2 cores, approximately 9 and 14 mm in discontinuous linear extent, approximately 80% of total core length. ??  - Perineural invasion identified in this case.    D:?? Prostate,?? Left base, needle biopsy   - Prostatic adenocarcinoma, Gleason score 4 + 5 = 9 (Grade group 5) involving 2 of 2 cores, approximately 13 and 13 mm in discontinuous linear extent, approximately 90% of total core length.     E:?? Prostate,?? Left mid, needle biopsy   - Prostatic adenocarcinoma, Gleason score 4 + 4 = 8 (Grade group 4) involving 2 of 2 cores, approximately 5 and 15 mm in discontinuous linear extent, approximately 70% of total core length.     F:?? Prostate,?? Left apex, needle biopsy   - Prostatic adenocarcinoma, Gleason score 4 + 5 = 9 (Grade group 5) involving 2 of 2 cores, approximately 9 and 11 mm in linear extent, approximately  90% of total core length. ??  - Suspicious for extraprostatic extension         Imaging results:  PET scan in 10/15/2014  IMPRESSION:  Markedly hypermetabolic right paratracheal lesion, consistent with  the patient's known malignancy. This is associated with  hypermetabolic metastases in the right hilum and scattered sclerotic  hypermetabolic bone metastases.    The cystic lesion in the pancreatic head is not hypermetabolic on  PET imaging. As such, this finding may not be related to metastatic  disease. Attention on followup imaging is suggested.    Mild FDG uptake in both adrenal glands with asymmetric appearance  and no underlying nodule or mass. Uptake felt to be physiologic, but  attention to these areas on follow-up is also recommended.    No evidence for hypermetabolic disease in the liver on today's  Study.    MRI of brain 08/30/2016  No evidence of intracranial metastasis      Bone scan 08/30/2016  Diffuse osseous metastatic disease, predominantly throughout the axial skeleton.    Bone scan 02/07/2017  Impression    Diffuse osseous metastatic disease predominantly in the axial skeleton, similar to prior. The only obvious new focus is in the right posterior ninth rib    X ray of femur R 02/21/2017  Impression      1. Unchanged severe right hip osteoarthrosis likely secondary to femoral head collapse related to osteonecrosis.    2. Likely distal femoral bone infarct. Otherwise, no focal lytic or blastic bone lesion.        MRI T/L Spine (performed at [facility] 03/2017)  Diffuse osseous mets, possible T10 pathologic fracture without height loss. No canal stenosis. Mild L3-4, L4-5 neural foraminal narrowing

## 2017-04-24 ENCOUNTER — Ambulatory Visit: Payer: Medicare Other

## 2017-04-24 ENCOUNTER — Ambulatory Visit: Payer: Medicare Other | Admitting: Internal Medicine

## 2017-04-24 ENCOUNTER — Other Ambulatory Visit: Payer: Medicare Other

## 2017-04-25 MED ORDER — DOXYCYCLINE HYCLATE 100 MG TABLET
ORAL_TABLET | 0 refills | 0.00000 days | Status: CP
Start: 2017-04-25 — End: 2017-05-21

## 2017-05-08 ENCOUNTER — Other Ambulatory Visit (HOSPITAL_BASED_OUTPATIENT_CLINIC_OR_DEPARTMENT_OTHER): Payer: Medicare Other

## 2017-05-08 ENCOUNTER — Other Ambulatory Visit: Payer: Self-pay | Admitting: Internal Medicine

## 2017-05-08 ENCOUNTER — Encounter: Payer: Self-pay | Admitting: Internal Medicine

## 2017-05-08 ENCOUNTER — Telehealth: Payer: Self-pay | Admitting: Internal Medicine

## 2017-05-08 ENCOUNTER — Ambulatory Visit (HOSPITAL_BASED_OUTPATIENT_CLINIC_OR_DEPARTMENT_OTHER): Payer: Medicare Other | Admitting: Internal Medicine

## 2017-05-08 ENCOUNTER — Ambulatory Visit: Payer: Medicare Other

## 2017-05-08 DIAGNOSIS — I951 Orthostatic hypotension: Secondary | ICD-10-CM

## 2017-05-08 DIAGNOSIS — C7989 Secondary malignant neoplasm of other specified sites: Secondary | ICD-10-CM | POA: Diagnosis not present

## 2017-05-08 DIAGNOSIS — C3491 Malignant neoplasm of unspecified part of right bronchus or lung: Secondary | ICD-10-CM

## 2017-05-08 DIAGNOSIS — C801 Malignant (primary) neoplasm, unspecified: Secondary | ICD-10-CM

## 2017-05-08 DIAGNOSIS — C7951 Secondary malignant neoplasm of bone: Secondary | ICD-10-CM | POA: Diagnosis not present

## 2017-05-08 DIAGNOSIS — C787 Secondary malignant neoplasm of liver and intrahepatic bile duct: Secondary | ICD-10-CM | POA: Diagnosis not present

## 2017-05-08 DIAGNOSIS — C3401 Malignant neoplasm of right main bronchus: Secondary | ICD-10-CM

## 2017-05-08 DIAGNOSIS — Z5112 Encounter for antineoplastic immunotherapy: Secondary | ICD-10-CM

## 2017-05-08 DIAGNOSIS — G8929 Other chronic pain: Secondary | ICD-10-CM

## 2017-05-08 DIAGNOSIS — G893 Neoplasm related pain (acute) (chronic): Secondary | ICD-10-CM

## 2017-05-08 DIAGNOSIS — R5382 Chronic fatigue, unspecified: Secondary | ICD-10-CM

## 2017-05-08 DIAGNOSIS — C3490 Malignant neoplasm of unspecified part of unspecified bronchus or lung: Secondary | ICD-10-CM

## 2017-05-08 LAB — COMPREHENSIVE METABOLIC PANEL
ALK PHOS: 634 U/L — AB (ref 40–150)
ALT: 204 U/L — ABNORMAL HIGH (ref 0–55)
ANION GAP: 12 meq/L — AB (ref 3–11)
AST: 322 U/L (ref 5–34)
Albumin: 2.9 g/dL — ABNORMAL LOW (ref 3.5–5.0)
BILIRUBIN TOTAL: 0.45 mg/dL (ref 0.20–1.20)
BUN: 39.9 mg/dL — ABNORMAL HIGH (ref 7.0–26.0)
CO2: 15 mEq/L — ABNORMAL LOW (ref 22–29)
Calcium: 9.5 mg/dL (ref 8.4–10.4)
Chloride: 111 mEq/L — ABNORMAL HIGH (ref 98–109)
Creatinine: 2.1 mg/dL — ABNORMAL HIGH (ref 0.7–1.3)
EGFR: 39 mL/min/{1.73_m2} — AB (ref >90)
Glucose: 97 mg/dl (ref 70–140)
POTASSIUM: 4.9 meq/L (ref 3.5–5.1)
SODIUM: 139 meq/L (ref 136–145)
TOTAL PROTEIN: 7.4 g/dL (ref 6.4–8.3)

## 2017-05-08 LAB — CBC WITH DIFFERENTIAL/PLATELET
BASO%: 0.4 % (ref 0.0–2.0)
BASOS ABS: 0 10*3/uL (ref 0.0–0.1)
EOS ABS: 0.5 10*3/uL (ref 0.0–0.5)
EOS%: 7.6 % — ABNORMAL HIGH (ref 0.0–7.0)
HCT: 35.3 % — ABNORMAL LOW (ref 38.4–49.9)
HGB: 11.6 g/dL — ABNORMAL LOW (ref 13.0–17.1)
LYMPH%: 15.1 % (ref 14.0–49.0)
MCH: 33 pg (ref 27.2–33.4)
MCHC: 33 g/dL (ref 32.0–36.0)
MCV: 99.9 fL — AB (ref 79.3–98.0)
MONO#: 0.9 10*3/uL (ref 0.1–0.9)
MONO%: 14.8 % — AB (ref 0.0–14.0)
NEUT%: 62.1 % (ref 39.0–75.0)
NEUTROS ABS: 3.8 10*3/uL (ref 1.5–6.5)
PLATELETS: 245 10*3/uL (ref 140–400)
RBC: 3.53 10*6/uL — AB (ref 4.20–5.82)
RDW: 20 % — ABNORMAL HIGH (ref 11.0–14.6)
WBC: 6.1 10*3/uL (ref 4.0–10.3)
lymph#: 0.9 10*3/uL (ref 0.9–3.3)

## 2017-05-08 MED ORDER — OXYCODONE-ACETAMINOPHEN 5-325 MG PO TABS: 1.0000 | ORAL_TABLET | Freq: Four times a day (QID) | ORAL | 0 refills | Status: DC | PRN

## 2017-05-08 NOTE — Progress Notes (Signed)
Per Dr. Julien Nordmann, hold pt treatment today because AST is 322.  Pt port accessed and flushed with NS and Heparin.

## 2017-05-08 NOTE — Addendum Note (Signed)
Addended by: Lucile Crater on: 05/08/2017 09:28 AM   Modules accepted: Orders

## 2017-05-08 NOTE — Progress Notes (Signed)
Labs reviewed by MD, delay treatment 1 week. Recheck labs 9/26, r/s infusion 9/26. Message to scheduling.

## 2017-05-08 NOTE — Telephone Encounter (Signed)
Gave avs and calendar for october °

## 2017-05-08 NOTE — Progress Notes (Addendum)
[facility] Telephone:([phone]) [phone]   Fax:(336) [place], [place] [address]  DIAGNOSIS:  1) Stage IV (T2b, N2, M1b) non-small cell lung cancer, adenocarcinoma with negative EGFR mutation and negative gene translocation presented with a large right hilar mass as well as mediastinal lymphadenopathy and metastatic disease to the liver, bone and pancreas diagnosed in February 2016. 2) prostate adenocarcinoma diagnosed at [facility] with Gleason score 9 (4+5). 3) treatment with Radium 223 for prostate cancer at [facility] on 02/21/2017  PRIOR THERAPY:  1) Systemic chemotherapy with carboplatin for AUC of 5 and Alimta 500 MG/M2 every 3 weeks. Status post 6 cycles. 2) Enzalutamide for prostate cancer at [facility] 3) Immunotherapy with Nivolumab 240 MG every 2 weeks, status post 48 cycles. 4) Radium 223 monthly at [facility], S/p 2 cycles.  CURRENT THERAPY:  Immunotherapy with Nivolumab 480 MG every 4 weeks, first cycle 04/10/2017. Status post one cycle.  INTERVAL HISTORY: Francisco Love 56 y.o. male returns to clinic today for follow-up visit accompanied by his brother-in-law. The patient is feeling fine today with no specific complaints. He tolerated the first cycle of the high-dose Nivolumab fairly well. He denied having any significant skin rash but has occasional diarrhea likely secondary to Radium 223. He denied having any chest pain, shortness breath except with exertion, cough or hemoptysis. He continues to have chronic back pain and requesting refill his pain medication. The patient denied having any nausea or vomiting. He has no fever or chills. He is here today for evaluation before starting cycle #2 of his treatment.   MEDICAL HISTORY: Past Medical History:  Diagnosis Date  . Chronic fatigue 04/12/2016  . Chronic pain 04/12/2016  . Chronic renal disease, stage III   .  Metastatic cancer ([place])     ALLERGIES:  has No Known Allergies.  MEDICATIONS:  Current Outpatient Prescriptions  Medication Sig Dispense Refill  . albuterol (PROVENTIL) (2.5 MG/3ML) 0.083% nebulizer solution Take 3 mLs (2.5 mg total) by nebulization every 2 (two) hours as needed for wheezing. (Patient not taking: Reported on 04/10/2017) 75 mL 12  . AMITIZA 24 MCG capsule Take 24 mcg by mouth 2 (two) times daily with a meal.     . bicalutamide (CASODEX) 50 MG tablet Take 50 mg by mouth daily.     . ciprofloxacin (CIPRO) 500 MG tablet Take 1 tablet (500 mg total) by mouth 2 (two) times daily. 6 tablet 0  . diphenhydrAMINE (BENADRYL) 25 MG tablet Take 25 mg by mouth at bedtime as needed for itching.     . folic acid (FOLVITE) 1 MG tablet Take 1 mg by mouth daily.    . furosemide (LASIX) 20 MG tablet Take 1 tablet (20 mg total) by mouth daily as needed for fluid or edema. (Patient not taking: Reported on 04/10/2017) 30 tablet 0  . levocetirizine (XYZAL) 5 MG tablet Take 5 mg by mouth daily.     Marland Kitchen lidocaine-prilocaine (EMLA) cream Apply 1 application topically as needed. Apply to port site prior to chemotherapy. 30 g 0  . magnesium oxide (MAG-OX) 400 MG tablet Take 400 mg by mouth daily.    . metoprolol tartrate (LOPRESSOR) 25 MG tablet Take 0.5 tablets (12.5 mg total) by mouth 2 (two) times daily. 60 tablet 0  . mirtazapine (REMERON) 15 MG tablet Take 1 tablet (15 mg total) by mouth at bedtime.    Marland Kitchen  oxyCODONE-acetaminophen (PERCOCET/ROXICET) 5-325 MG tablet Take 1 tablet by mouth every 6 (six) hours as needed for severe pain. 90 tablet 0  . polyethylene glycol (MIRALAX / GLYCOLAX) packet Take 17 g by mouth daily as needed for mild constipation.     . prochlorperazine (COMPAZINE) 10 MG tablet Take 1 tablet (10 mg total) by mouth every 6 (six) hours as needed for nausea or vomiting. 30 tablet 1  . senna-docusate (SENOKOT-S) 8.6-50 MG per tablet Take 1 tablet by mouth daily.    . sodium bicarbonate  650 MG tablet Take 1 tablet (650 mg total) by mouth daily. 30 tablet 0  . SUMAtriptan (IMITREX) 25 MG tablet Take 25 mg by mouth every 2 (two) hours as needed for migraine.     . tamsulosin (FLOMAX) 0.4 MG CAPS capsule Take 0.4 mg by mouth daily.     . valACYclovir (VALTREX) 500 MG tablet Take 500 mg by mouth 2 (two) times daily.    [name] 40 MG capsule Take 160 mg by mouth every evening.      No current facility-administered medications for this visit.     SURGICAL HISTORY:  Past Surgical History:  Procedure Laterality Date  . APPENDECTOMY    . EUS N/A 03/28/2017   Procedure: UPPER ENDOSCOPIC ULTRASOUND (EUS) RADIAL;  Surgeon: Milus Banister, MD;  Location: [facility];  Service: Endoscopy;  Laterality: N/A;  . VIDEO BRONCHOSCOPY N/A 09/21/2014   Procedure: VIDEO BRONCHOSCOPY WITH FLUORO;  Surgeon: Rigoberto Noel, MD;  Location: [place];  Service: Cardiopulmonary;  Laterality: N/A;  . VIDEO BRONCHOSCOPY WITH ENDOBRONCHIAL ULTRASOUND N/A 09/23/2014   Procedure: VIDEO BRONCHOSCOPY WITH ENDOBRONCHIAL ULTRASOUND;  Surgeon: Rigoberto Noel, MD;  Location: [facility];  Service: Pulmonary;  Laterality: N/A;    REVIEW OF SYSTEMS:  A comprehensive review of systems was negative except for: Constitutional: positive for fatigue Respiratory: positive for dyspnea on exertion Musculoskeletal: positive for back pain   PHYSICAL EXAMINATION: General appearance: alert, cooperative, fatigued and no distress Head: Normocephalic, without obvious abnormality, atraumatic Neck: no adenopathy, no JVD, supple, symmetrical, trachea midline and thyroid not enlarged, symmetric, no tenderness/mass/nodules Lymph nodes: Cervical, supraclavicular, and axillary nodes normal. Resp: clear to auscultation bilaterally Back: symmetric, no curvature. ROM normal. No CVA tenderness. Cardio: regular rate and rhythm, S1, S2 normal, no murmur, click, rub or gallop GI: soft, non-tender; bowel sounds normal; no masses,  no  organomegaly Extremities: extremities normal, atraumatic, no cyanosis or edema   ECOG PERFORMANCE STATUS: 1 - Symptomatic but completely ambulatory  Blood pressure 124/89, pulse (!) 106, temperature 98 F (36.7 C), temperature source Oral, resp. rate 18, weight 166 lb 6.4 oz (75.5 kg), SpO2 100 %.  LABORATORY DATA: Lab Results  Component Value Date   WBC 6.1 05/08/2017   HGB 11.6 (L) 05/08/2017   HCT 35.3 (L) 05/08/2017   MCV 99.9 (H) 05/08/2017   PLT 245 05/08/2017      Chemistry      Component Value Date/Time   NA 139 05/08/2017 0743   K 4.9 05/08/2017 0743   CL 104 12/24/2016 0849   CO2 15 (L) 05/08/2017 0743   BUN 39.9 (H) 05/08/2017 0743   CREATININE 2.1 (H) 05/08/2017 0743      Component Value Date/Time   CALCIUM 9.5 05/08/2017 0743   ALKPHOS 634 (H) 05/08/2017 0743   AST 322 (HH) 05/08/2017 0743   ALT 204 (H) 05/08/2017 0743   BILITOT 0.45 05/08/2017 0743       RADIOGRAPHIC STUDIES:  No results found. ASSESSMENT AND PLAN:  This is a very pleasant 56 years old African-American male with metastatic non-small cell lung cancer, adenocarcinoma status post induction systemic chemotherapy was carboplatin and Alimta. He was started on second line treatment with Nivolumab 240 mg IV every 2 weeks is status post 48 cycles. He is currently on treatment with Nivolumab 480 MG IV every 4 weeks is status post 1 cycle. He tolerated the first cycle of this treatment fairly well with no significant adverse effects. I recommended for the patient to proceed with cycle #2 today as a scheduled. I will see him back for follow-up visit in 4 weeks for evaluation before starting cycle #3 after repeating CT scan of the chest, abdomen and pelvis for restaging of his disease. For pain management, he was given a refill of Percocet. The patient was advised to call immediately if he has any concerning symptoms in the interval. The patient voices understanding of current disease status and  treatment options and is in agreement with the current care plan. All questions were answered. The patient knows to call the clinic with any problems, questions or concerns. We can certainly see the patient much sooner if necessary.  Disclaimer: This note was dictated with voice recognition software. Similar sounding words can inadvertently be transcribed and may not be corrected upon review.  ADDENDUM: Comprehensive metabolic panel and that became available later today showed significant elevation of his liver enzymes, Concerning for immunotherapy induced hepatitis but other etiologies cannot be excluded. I will delay the start of cycle #2 of his treatment by one week. We will repeat his comprehensive metabolic panel next week before resuming the treatment. If it continues to be elevated, I would consider the patient for treatment with high-dose prednisone. He was advised to call immediately if he has any other concerning symptoms in the interval.

## 2017-05-15 ENCOUNTER — Emergency Department (HOSPITAL_COMMUNITY): Payer: Medicare Other

## 2017-05-15 ENCOUNTER — Inpatient Hospital Stay (HOSPITAL_COMMUNITY)
Admission: EM | Admit: 2017-05-15 | Discharge: 2017-05-18 | DRG: 442 | Disposition: A | Discharge status: Home / Self Care | Payer: Medicare Other | Attending: Internal Medicine | Admitting: Internal Medicine

## 2017-05-15 ENCOUNTER — Ambulatory Visit: Payer: Medicare Other

## 2017-05-15 ENCOUNTER — Other Ambulatory Visit (HOSPITAL_BASED_OUTPATIENT_CLINIC_OR_DEPARTMENT_OTHER): Payer: Medicare Other

## 2017-05-15 ENCOUNTER — Encounter (HOSPITAL_COMMUNITY): Payer: Self-pay | Admitting: *Deleted

## 2017-05-15 ENCOUNTER — Observation Stay (HOSPITAL_COMMUNITY): Payer: Medicare Other

## 2017-05-15 VITALS — BP 124/70 | HR 104 | Temp 97.9°F | Resp 24

## 2017-05-15 DIAGNOSIS — C61 Malignant neoplasm of prostate: Secondary | ICD-10-CM | POA: Diagnosis present

## 2017-05-15 DIAGNOSIS — E46 Unspecified protein-calorie malnutrition: Secondary | ICD-10-CM | POA: Diagnosis present

## 2017-05-15 DIAGNOSIS — C3491 Malignant neoplasm of unspecified part of right bronchus or lung: Secondary | ICD-10-CM

## 2017-05-15 DIAGNOSIS — K7589 Other specified inflammatory liver diseases: Secondary | ICD-10-CM | POA: Diagnosis not present

## 2017-05-15 DIAGNOSIS — Z87891 Personal history of nicotine dependence: Secondary | ICD-10-CM

## 2017-05-15 DIAGNOSIS — G893 Neoplasm related pain (acute) (chronic): Secondary | ICD-10-CM | POA: Diagnosis present

## 2017-05-15 DIAGNOSIS — Z9221 Personal history of antineoplastic chemotherapy: Secondary | ICD-10-CM

## 2017-05-15 DIAGNOSIS — R748 Abnormal levels of other serum enzymes: Secondary | ICD-10-CM | POA: Diagnosis not present

## 2017-05-15 DIAGNOSIS — Z79899 Other long term (current) drug therapy: Secondary | ICD-10-CM | POA: Diagnosis not present

## 2017-05-15 DIAGNOSIS — T50905A Adverse effect of unspecified drugs, medicaments and biological substances, initial encounter: Secondary | ICD-10-CM | POA: Diagnosis not present

## 2017-05-15 DIAGNOSIS — T451X5A Adverse effect of antineoplastic and immunosuppressive drugs, initial encounter: Secondary | ICD-10-CM | POA: Diagnosis present

## 2017-05-15 DIAGNOSIS — K716 Toxic liver disease with hepatitis, not elsewhere classified: Secondary | ICD-10-CM | POA: Diagnosis not present

## 2017-05-15 DIAGNOSIS — R945 Abnormal results of liver function studies: Secondary | ICD-10-CM

## 2017-05-15 DIAGNOSIS — E872 Acidosis, unspecified: Secondary | ICD-10-CM | POA: Diagnosis present

## 2017-05-15 DIAGNOSIS — R7989 Other specified abnormal findings of blood chemistry: Secondary | ICD-10-CM

## 2017-05-15 DIAGNOSIS — Z6824 Body mass index (BMI) 24.0-24.9, adult: Secondary | ICD-10-CM

## 2017-05-15 DIAGNOSIS — C3401 Malignant neoplasm of right main bronchus: Secondary | ICD-10-CM | POA: Diagnosis present

## 2017-05-15 DIAGNOSIS — C801 Malignant (primary) neoplasm, unspecified: Secondary | ICD-10-CM

## 2017-05-15 DIAGNOSIS — R5382 Chronic fatigue, unspecified: Secondary | ICD-10-CM | POA: Diagnosis present

## 2017-05-15 DIAGNOSIS — N184 Chronic kidney disease, stage 4 (severe): Secondary | ICD-10-CM | POA: Diagnosis present

## 2017-05-15 DIAGNOSIS — J9610 Chronic respiratory failure, unspecified whether with hypoxia or hypercapnia: Secondary | ICD-10-CM | POA: Diagnosis present

## 2017-05-15 DIAGNOSIS — Z801 Family history of malignant neoplasm of trachea, bronchus and lung: Secondary | ICD-10-CM

## 2017-05-15 DIAGNOSIS — C7951 Secondary malignant neoplasm of bone: Secondary | ICD-10-CM | POA: Diagnosis present

## 2017-05-15 DIAGNOSIS — Z923 Personal history of irradiation: Secondary | ICD-10-CM

## 2017-05-15 LAB — ETHANOL

## 2017-05-15 LAB — ACETAMINOPHEN LEVEL

## 2017-05-15 LAB — COMPREHENSIVE METABOLIC PANEL
ALK PHOS: 1001 U/L — AB (ref 38–126)
ALT: 401 U/L — AB (ref 0–55)
ALT: 339 U/L — ABNORMAL HIGH (ref 17–63)
ANION GAP: 11 meq/L (ref 3–11)
ANION GAP: 9 (ref 5–15)
AST: 645 U/L (ref 5–34)
AST: 586 U/L — AB (ref 15–41)
Albumin: 2.8 g/dL — ABNORMAL LOW (ref 3.5–5.0)
Albumin: 2.9 g/dL — ABNORMAL LOW (ref 3.5–5.0)
Alkaline Phosphatase: 1139 U/L — ABNORMAL HIGH (ref 40–150)
BILIRUBIN TOTAL: 0.99 mg/dL (ref 0.20–1.20)
BILIRUBIN TOTAL: 0.8 mg/dL (ref 0.3–1.2)
BUN: 40 mg/dL — ABNORMAL HIGH (ref 7.0–26.0)
BUN: 42 mg/dL — AB (ref 6–20)
CO2: 15 meq/L — AB (ref 22–29)
CO2: 14 mmol/L — AB (ref 22–32)
CREATININE: 2.4 mg/dL — AB (ref 0.7–1.3)
Calcium: 9.5 mg/dL (ref 8.4–10.4)
Calcium: 8.8 mg/dL — ABNORMAL LOW (ref 8.9–10.3)
Chloride: 117 mEq/L — ABNORMAL HIGH (ref 98–109)
Chloride: 116 mmol/L — ABNORMAL HIGH (ref 101–111)
Creatinine, Ser: 2.25 mg/dL — ABNORMAL HIGH (ref 0.61–1.24)
EGFR: 33 mL/min/{1.73_m2} — ABNORMAL LOW (ref >90)
GFR calc Af Amer: 35 mL/min — ABNORMAL LOW (ref >60)
GFR calc non Af Amer: 30 mL/min — ABNORMAL LOW (ref >60)
GLUCOSE: 117 mg/dL (ref 70–140)
GLUCOSE: 108 mg/dL — AB (ref 65–99)
POTASSIUM: 4.3 mmol/L (ref 3.5–5.1)
Potassium: 4.8 mEq/L (ref 3.5–5.1)
SODIUM: 139 mmol/L (ref 135–145)
Sodium: 143 mEq/L (ref 136–145)
TOTAL PROTEIN: 7.8 g/dL (ref 6.4–8.3)
Total Protein: 7.2 g/dL (ref 6.5–8.1)

## 2017-05-15 LAB — CBC WITH DIFFERENTIAL/PLATELET
BASO%: 0.6 % (ref 0.0–2.0)
Basophils Absolute: 0 10*3/uL (ref 0.0–0.1)
Basophils Absolute: 0 10*3/uL (ref 0.0–0.1)
Basophils Relative: 0 %
EOS PCT: 3 %
EOS%: 3.4 % (ref 0.0–7.0)
Eosinophils Absolute: 0.2 10*3/uL (ref 0.0–0.5)
Eosinophils Absolute: 0.2 10*3/uL (ref 0.0–0.7)
HEMATOCRIT: 38.4 % (ref 38.4–49.9)
HEMATOCRIT: 34.9 % — AB (ref 39.0–52.0)
HGB: 12.7 g/dL — ABNORMAL LOW (ref 13.0–17.1)
Hemoglobin: 12 g/dL — ABNORMAL LOW (ref 13.0–17.0)
LYMPH#: 1.5 10*3/uL (ref 0.9–3.3)
LYMPH%: 22.6 % (ref 14.0–49.0)
LYMPHS PCT: 20 %
Lymphs Abs: 1.6 10*3/uL (ref 0.7–4.0)
MCH: 32.5 pg (ref 27.2–33.4)
MCH: 32.5 pg (ref 26.0–34.0)
MCHC: 33.1 g/dL (ref 32.0–36.0)
MCHC: 34.4 g/dL (ref 30.0–36.0)
MCV: 98.2 fL — ABNORMAL HIGH (ref 79.3–98.0)
MCV: 94.6 fL (ref 78.0–100.0)
MONO ABS: 1.1 10*3/uL — AB (ref 0.1–1.0)
MONO#: 0.8 10*3/uL (ref 0.1–0.9)
MONO%: 12.3 % (ref 0.0–14.0)
MONOS PCT: 15 %
NEUT%: 61.1 % (ref 39.0–75.0)
NEUTROS ABS: 4.1 10*3/uL (ref 1.5–6.5)
NEUTROS ABS: 4.7 10*3/uL (ref 1.7–7.7)
Neutrophils Relative %: 62 %
PLATELETS: 283 10*3/uL (ref 140–400)
PLATELETS: 281 10*3/uL (ref 150–400)
RBC: 3.91 10*6/uL — AB (ref 4.20–5.82)
RBC: 3.69 MIL/uL — ABNORMAL LOW (ref 4.22–5.81)
RDW: 19 % — ABNORMAL HIGH (ref 11.0–14.6)
RDW: 18.9 % — AB (ref 11.5–15.5)
WBC: 6.8 10*3/uL (ref 4.0–10.3)
WBC: 7.7 10*3/uL (ref 4.0–10.5)

## 2017-05-15 LAB — URINALYSIS, ROUTINE W REFLEX MICROSCOPIC
BACTERIA UA: NONE SEEN
BILIRUBIN URINE: NEGATIVE
Glucose, UA: NEGATIVE mg/dL
Ketones, ur: NEGATIVE mg/dL
Leukocytes, UA: NEGATIVE
Nitrite: NEGATIVE
PROTEIN: 100 mg/dL — AB
SPECIFIC GRAVITY, URINE: 1.012 (ref 1.005–1.030)
SQUAMOUS EPITHELIAL / LPF: NONE SEEN
WBC, UA: NONE SEEN WBC/hpf (ref 0–5)
pH: 5 (ref 5.0–8.0)

## 2017-05-15 LAB — RAPID URINE DRUG SCREEN, HOSP PERFORMED
AMPHETAMINES: NOT DETECTED
BARBITURATES: NOT DETECTED
Benzodiazepines: NOT DETECTED
Cocaine: NOT DETECTED
Opiates: NOT DETECTED
TETRAHYDROCANNABINOL: NOT DETECTED

## 2017-05-15 LAB — PROTIME-INR
INR: 1.17
Prothrombin Time: 14.8 seconds (ref 11.4–15.2)

## 2017-05-15 LAB — TSH: TSH: 2.367 m(IU)/L (ref 0.320–4.118)

## 2017-05-15 LAB — I-STAT CG4 LACTIC ACID, ED: Lactic Acid, Venous: 1.03 mmol/L (ref 0.5–1.9)

## 2017-05-15 LAB — APTT: APTT: 75 s — AB (ref 24–36)

## 2017-05-15 MED ORDER — IOPAMIDOL (ISOVUE-300) INJECTION 61%
INTRAVENOUS | Status: AC
  Administered 2017-05-15: 30 mL
  Filled 2017-05-15: qty 30

## 2017-05-15 MED ORDER — HEPARIN SODIUM (PORCINE) 5000 UNIT/ML IJ SOLN
5000.0000 [IU] | Freq: Three times a day (TID) | INTRAMUSCULAR | Status: DC
  Administered 2017-05-15 – 2017-05-18 (×8): 5000 [IU] via SUBCUTANEOUS
  Filled 2017-05-15 (×8): qty 1

## 2017-05-15 MED ORDER — VALACYCLOVIR HCL 500 MG PO TABS
500.0000 mg | ORAL_TABLET | Freq: Two times a day (BID) | ORAL | Status: DC
  Administered 2017-05-15 – 2017-05-18 (×6): 500 mg via ORAL
  Filled 2017-05-15 (×7): qty 1

## 2017-05-15 MED ORDER — SODIUM CHLORIDE 0.9 % IV BOLUS (SEPSIS)
1000.0000 mL | Freq: Once | INTRAVENOUS | Status: AC
  Administered 2017-05-15: 1000 mL via INTRAVENOUS

## 2017-05-15 MED ORDER — ALBUTEROL SULFATE (2.5 MG/3ML) 0.083% IN NEBU: 2.5000 mg | INHALATION_SOLUTION | RESPIRATORY_TRACT | Status: DC | PRN

## 2017-05-15 MED ORDER — PROCHLORPERAZINE MALEATE 10 MG PO TABS: 10.0000 mg | ORAL_TABLET | Freq: Four times a day (QID) | ORAL | Status: DC | PRN

## 2017-05-15 MED ORDER — METHYLPREDNISOLONE SODIUM SUCC 125 MG IJ SOLR
60.0000 mg | Freq: Every day | INTRAMUSCULAR | Status: DC
  Administered 2017-05-15 – 2017-05-18 (×4): 60 mg via INTRAVENOUS
  Filled 2017-05-15 (×4): qty 2

## 2017-05-15 MED ORDER — ONDANSETRON HCL 4 MG PO TABS: 4.0000 mg | ORAL_TABLET | Freq: Four times a day (QID) | ORAL | Status: DC | PRN

## 2017-05-15 MED ORDER — ONDANSETRON HCL 4 MG/2ML IJ SOLN: 4.0000 mg | Freq: Four times a day (QID) | INTRAMUSCULAR | Status: DC | PRN

## 2017-05-15 MED ORDER — OXYCODONE HCL 5 MG PO TABS
5.0000 mg | ORAL_TABLET | ORAL | Status: DC | PRN
  Administered 2017-05-15 – 2017-05-18 (×10): 5 mg via ORAL
  Filled 2017-05-15 (×10): qty 1

## 2017-05-15 MED ORDER — SODIUM BICARBONATE 650 MG PO TABS
650.0000 mg | ORAL_TABLET | Freq: Every day | ORAL | Status: DC
  Administered 2017-05-16 – 2017-05-18 (×3): 650 mg via ORAL
  Filled 2017-05-15 (×3): qty 1

## 2017-05-15 MED ORDER — LUBIPROSTONE 24 MCG PO CAPS
24.0000 ug | ORAL_CAPSULE | Freq: Two times a day (BID) | ORAL | Status: DC
  Administered 2017-05-16 – 2017-05-18 (×5): 24 ug via ORAL
  Filled 2017-05-15 (×7): qty 1

## 2017-05-15 MED ORDER — POLYETHYLENE GLYCOL 3350 17 G PO PACK: 17.0000 g | PACK | Freq: Every day | ORAL | Status: DC | PRN

## 2017-05-15 MED ORDER — OXYCODONE-ACETAMINOPHEN 5-325 MG PO TABS
1.0000 | ORAL_TABLET | Freq: Once | ORAL | Status: AC
  Administered 2017-05-15: 1 via ORAL
  Filled 2017-05-15: qty 1

## 2017-05-15 MED ORDER — MIRTAZAPINE 30 MG PO TABS
15.0000 mg | ORAL_TABLET | Freq: Every day | ORAL | Status: DC
  Administered 2017-05-15 – 2017-05-17 (×3): 15 mg via ORAL
  Filled 2017-05-15 (×3): qty 1

## 2017-05-15 MED ORDER — ENZALUTAMIDE 40 MG PO CAPS
160.0000 mg | ORAL_CAPSULE | Freq: Every evening | ORAL | Status: DC
  Administered 2017-05-16 – 2017-05-17 (×2): 160 mg via ORAL
  Filled 2017-05-15: qty 1

## 2017-05-15 MED ORDER — SENNOSIDES-DOCUSATE SODIUM 8.6-50 MG PO TABS
1.0000 | ORAL_TABLET | Freq: Every day | ORAL | Status: DC
  Administered 2017-05-15 – 2017-05-18 (×4): 1 via ORAL
  Filled 2017-05-15 (×4): qty 1

## 2017-05-15 MED ORDER — SODIUM CHLORIDE 0.9 % IV SOLN
INTRAVENOUS | Status: DC
  Administered 2017-05-16 – 2017-05-18 (×4): via INTRAVENOUS

## 2017-05-15 MED ORDER — TAMSULOSIN HCL 0.4 MG PO CAPS
0.4000 mg | ORAL_CAPSULE | Freq: Every day | ORAL | Status: DC
  Administered 2017-05-16 – 2017-05-18 (×3): 0.4 mg via ORAL
  Filled 2017-05-15 (×3): qty 1

## 2017-05-15 MED ORDER — FOLIC ACID 1 MG PO TABS
1.0000 mg | ORAL_TABLET | Freq: Every day | ORAL | Status: DC
  Administered 2017-05-15 – 2017-05-18 (×4): 1 mg via ORAL
  Filled 2017-05-15 (×4): qty 1

## 2017-05-15 MED ORDER — METHYLPREDNISOLONE SODIUM SUCC 125 MG IJ SOLR: 80.0000 mg | Freq: Every day | INTRAMUSCULAR | Status: DC

## 2017-05-15 NOTE — ED Triage Notes (Signed)
Pt sent from cancer center due to pt feeling increased SOB, elevated ALT, AST, BUN and Creatinine. Pt states he had to increase his O2 from 2 to 3 L 1 week ago. Pt last received chemo 3 weeks ago, pt has hx of stage 4 lung cancer.

## 2017-05-15 NOTE — H&P (Addendum)
[facility] History and Physical   Patient: Francisco Love SWN:[id number]   PCP: Curt Bears, MD DOB: 18-Oct-1960   DOA: 05/15/2017   DOS: 05/15/2017   DOS: the patient was seen and examined on 05/15/2017 Patient coming from: The patient is coming from home.  Chief Complaint: fatigue, abnormal lab  HPI: Francisco Love is a 56 y.o. male with Past medical history of metastatic adenocarcinoma of the right lung with bone metastasis, chronic respiratory failure, prostate cancer, chronic kidney disease stage III. Patient presented with complaints of fatigue Patientdiagnosed with metastatic adenocarcinoma of the lungin 2016,recently started on immunotherapy with high-dose Nivolumab. Also recently had treatment with radium 223 for prostate cancer. He has been having fatigue, 2-3 loose watery daily diarrhea since that treatment. Is appetite has worsened recently in last few days.He was seen at cancer center for second cycle of his chemotherapy but due to his elevated LFTit was postponed for one week. When he presented there today his LFTs had worsened further and therefore he was referred to ER. Patient denies having any complaints of nausea or vomiting, no abdominal pain, no active bleeding, no burning urination. He does have fever as well as chills. Although they did not check his temperature at home. No other recent change in medications reported.  ED Course: presented with these complaints, CT abdomen pelvis with contrast shows no acute abnormality and shows stable chronic cancer lesions. She was recommended to be admitted in the hospital.  At his baseline ambulates without support And is independent for most of his ADL; manages his medication on his own.  Review of Systems: as mentioned in the history of present illness.  All other systems reviewed and are negative.  Past Medical History:  Diagnosis Date  . Chronic fatigue 04/12/2016  . Chronic pain 04/12/2016  . Chronic renal  disease, stage III   . Metastatic cancer [facility])    Past Surgical History:  Procedure Laterality Date  . APPENDECTOMY    . EUS N/A 03/28/2017   Procedure: UPPER ENDOSCOPIC ULTRASOUND (EUS) RADIAL;  Surgeon: Milus Banister, MD;  Location: [facility];  Service: Endoscopy;  Laterality: N/A;  . VIDEO BRONCHOSCOPY N/A 09/21/2014   Procedure: VIDEO BRONCHOSCOPY WITH FLUORO;  Surgeon: Rigoberto Noel, MD;  Location: [place];  Service: Cardiopulmonary;  Laterality: N/A;  . VIDEO BRONCHOSCOPY WITH ENDOBRONCHIAL ULTRASOUND N/A 09/23/2014   Procedure: VIDEO BRONCHOSCOPY WITH ENDOBRONCHIAL ULTRASOUND;  Surgeon: Rigoberto Noel, MD;  Location: [place];  Service: Pulmonary;  Laterality: N/A;   Social History:  reports that he quit smoking about 2 years ago. His smoking use included Cigarettes. He smoked 1.00 pack per day. He has never used smokeless tobacco. He reports that he drinks about 3.6 oz of alcohol per week . He reports that he uses drugs, including Marijuana.  No Known Allergies   Family History  Problem Relation Age of Onset  . Lung cancer Brother      Prior to Admission medications   Medication Sig Start Date End Date Taking? Authorizing Provider  albuterol (PROVENTIL) (2.5 MG/3ML) 0.083% nebulizer solution Take 3 mLs (2.5 mg total) by nebulization every 2 (two) hours as needed for wheezing. 09/24/14  Yes Delfina Redwood, MD  AMITIZA 24 MCG capsule Take 24 mcg by mouth 2 (two) times daily with a meal.  04/06/15  Yes [provider]  betamethasone dipropionate (DIPROLENE) 0.05 % ointment Apply twice daily to affected areas as needed for itching 04/15/17  Yes [provider]  bicalutamide (CASODEX)  50 MG tablet Take 50 mg by mouth daily.  09/08/15  Yes [provider]  folic acid (FOLVITE) 1 MG tablet Take 1 mg by mouth daily.   Yes [provider]  magnesium oxide (MAG-OX) 400 MG tablet Take 400 mg by mouth daily.   Yes [provider]  metoprolol  tartrate (LOPRESSOR) 25 MG tablet Take 0.5 tablets (12.5 mg total) by mouth 2 (two) times daily. 12/24/16  Yes Florencia Reasons, MD  mirtazapine (REMERON) 15 MG tablet Take 1 tablet (15 mg total) by mouth at bedtime. 12/19/16  Yes Curt Bears, MD  oxyCODONE-acetaminophen (PERCOCET/ROXICET) 5-325 MG tablet Take 1 tablet by mouth every 6 (six) hours as needed for severe pain. 05/08/17  Yes Curt Bears, MD  prochlorperazine (COMPAZINE) 10 MG tablet Take 1 tablet (10 mg total) by mouth every 6 (six) hours as needed for nausea or vomiting. 10/22/14  Yes Curt Bears, MD  senna-docusate (SENOKOT-S) 8.6-50 MG per tablet Take 1 tablet by mouth daily. 10/19/14  Yes Lauree Chandler, NP  sodium bicarbonate 650 MG tablet Take 1 tablet (650 mg total) by mouth daily. 12/24/16  Yes Florencia Reasons, MD  SUMAtriptan (IMITREX) 25 MG tablet Take 25 mg by mouth every 2 (two) hours as needed for migraine.  05/31/16  Yes [provider]  tamsulosin (FLOMAX) 0.4 MG CAPS capsule Take 0.4 mg by mouth daily.  03/29/15  Yes [provider]  valACYclovir (VALTREX) 500 MG tablet Take 500 mg by mouth 2 (two) times daily. 03/08/17  Yes [provider]  XTANDI 40 MG capsule Take 160 mg by mouth every evening.  07/24/16  Yes [provider]  ciprofloxacin (CIPRO) 500 MG tablet Take 1 tablet (500 mg total) by mouth 2 (two) times daily. Patient not taking: Reported on 05/15/2017 03/28/17   Milus Banister, MD  furosemide (LASIX) 20 MG tablet Take 1 tablet (20 mg total) by mouth daily as needed for fluid or edema. Patient not taking: Reported on 05/15/2017 12/24/16   Florencia Reasons, MD  lidocaine-prilocaine (EMLA) cream Apply 1 application topically as needed. Apply to port site prior to chemotherapy. Patient not taking: Reported on 05/15/2017 11/07/16   Curt Bears, MD    Physical Exam: Vitals:   05/15/17 1353 05/15/17 1527 05/15/17 1612 05/15/17 1701  BP: (!) 138/122 114/83 121/89 (!) 129/91  Pulse: 88 82 91 86    Resp: 16 17 18 15   Temp: 97.9 F (36.6 C)     TempSrc: Oral     SpO2: 100% 99% 98% 98%    General: Alert, Awake and Oriented to Time, Place and Person. Appear in mild distress, affect appropriate Eyes: PERRL, Conjunctiva normal ENT: Oral Mucosa clear moist. Muddy sclera Neck: no JVD, no Abnormal Mass Or lumps Cardiovascular: S1 and S2 Present, no Murmur, Peripheral Pulses Present Respiratory: normal respiratory effort, Bilateral Air entry equal and Decreased, no use of accessory muscle, Clear to Auscultation, no Crackles, no wheezes Abdomen: Bowel Sound present, Soft and no tenderness, no hernia Skin: no redness, no Rash, no induration Extremities: no Pedal edema, no calf tenderness Neurologic: Grossly no focal neuro deficit. Bilaterally Equal motor strength  Labs on Admission:  CBC:  Recent Labs Lab 05/15/17 0751 05/15/17 1023  WBC 6.8 7.7  NEUTROABS 4.1 4.7  HGB 12.7* 12.0*  HCT 38.4 34.9*  MCV 98.2* 94.6  PLT 283 076   Basic Metabolic Panel:  Recent Labs Lab 05/15/17 0751 05/15/17 1023  NA 143 139  K 4.8 4.3  CL  --  116*  CO2 15* 14*  GLUCOSE 117 108*  BUN 40.0* 42*  CREATININE 2.4* 2.25*  CALCIUM 9.5 8.8*   GFR: Estimated Creatinine Clearance: 35.8 mL/min (A) (by C-G formula based on SCr of 2.25 mg/dL (H)). Liver Function Tests:  Recent Labs Lab 05/15/17 0751 05/15/17 1023  AST 645* 586*  ALT 401* 339*  ALKPHOS 1,139* 1,001*  BILITOT 0.99 0.8  PROT 7.8 7.2  ALBUMIN 2.8* 2.9*   No results for input(s): LIPASE, AMYLASE in the last 168 hours. No results for input(s): AMMONIA in the last 168 hours. Coagulation Profile: No results for input(s): INR, PROTIME in the last 168 hours. Cardiac Enzymes: No results for input(s): CKTOTAL, CKMB, CKMBINDEX, TROPONINI in the last 168 hours. BNP (last 3 results) No results for input(s): PROBNP in the last 8760 hours. HbA1C: No results for input(s): HGBA1C in the last 72 hours. CBG: No results for  input(s): GLUCAP in the last 168 hours. Lipid Profile: No results for input(s): CHOL, HDL, LDLCALC, TRIG, CHOLHDL, LDLDIRECT in the last 72 hours. Thyroid Function Tests:  Recent Labs  05/15/17 0751  TSH 2.367   Anemia Panel: No results for input(s): VITAMINB12, FOLATE, FERRITIN, TIBC, IRON, RETICCTPCT in the last 72 hours. Urine analysis:    Component Value Date/Time   COLORURINE YELLOW 05/15/2017 [place] 05/15/2017 1509   LABSPEC 1.012 05/15/2017 1509   PHURINE 5.0 05/15/2017 1509   GLUCOSEU NEGATIVE 05/15/2017 1509   HGBUR SMALL (A) 05/15/2017 1509   BILIRUBINUR NEGATIVE 05/15/2017 1509   KETONESUR NEGATIVE 05/15/2017 1509   PROTEINUR 100 (A) 05/15/2017 1509   NITRITE NEGATIVE 05/15/2017 1509   LEUKOCYTESUR NEGATIVE 05/15/2017 1509    Radiological Exams on Admission: Ct Abdomen Pelvis Wo Contrast  Result Date: 05/15/2017 CLINICAL DATA:  Hypoxia. Chemotherapy treatment 3 weeks ago. Metastatic lung cancer. Increased shortness of breath. Elevated creatinine. Vomiting and diarrhea. EXAM: CT ABDOMEN AND PELVIS WITHOUT CONTRAST TECHNIQUE: Multidetector CT imaging of the abdomen and pelvis was performed following the standard protocol without IV contrast. COMPARISON:  CT abdomen 03/27/2017 FINDINGS: Lower chest: Stable chronic bilateral pleural thickening. Centrilobular emphysema. Indistinctly marginated ground-glass density in the right middle lobe laterally, potentially from mild alveolitis or edema. Hepatobiliary: 0.9 cm cyst laterally in the lateral segment left hepatic lobe, no change from 09/20/2014. No significant liver lesion identified. Gallbladder mildly contracted. Pancreas: Faint hypodensity in the pancreatic head with adjacent calcifications, chronically stable, possibly related to chronic calcific pancreatitis, and not definitely changed from 09/20/2014. Spleen: Unremarkable Adrenals/Urinary Tract: Duplicated right collecting system. Atrophic left kidney.  Adrenal glands unremarkable. Stomach/Bowel: Scattered air-fluid levels in borderline dilated loops of small bowel, no obvious transition point. Vascular/Lymphatic: Aortoiliac atherosclerotic vascular disease. Chronically stable stranding around the proximal common iliac arteries, not changed from 2016. No pathologic adenopathy. Reproductive: Unremarkable Other: No supplemental non-categorized findings. Musculoskeletal: Extensive osseous metastatic disease with similar periostitis along the left iliac bone and a similar distribution of lesions. Markedly severe arthropathy of both hips. No new bony metastatic disease is identified. Direct left inguinal hernia containing adipose tissue noted. IMPRESSION: 1. There is some new faint ground-glass opacity anteriorly in the right middle lobe potentially from low-grade alveolitis. Underlying emphysema noted along with chronic pleural thickening. 2. Borderline dilated small bowel loops containing scattered air-fluid levels, query diffuse small bowel ileus. I do not see a definite lead point for obstruction. 3. Stable extensive osseous metastatic disease, with similar periostitis along the left iliac bone. 4. Other imaging findings  of potential clinical significance: Chronic calcific in cystic lesion in the pancreatic head. Duplicated right renal collecting system. Atrophic left kidney. Aortic Atherosclerosis (ICD10-I70.0). Chronic indistinct density along the aortic bifurcation and proximal common iliac vessels. Severe bilateral hip arthropathy. Direct left inguinal hernia containing adipose tissue. Electronically Signed   By: Van Clines M.D.   On: 05/15/2017 13:52   Dg Chest 2 View  Result Date: 05/15/2017 CLINICAL DATA:  Increase weakness and shortness of breath over the past several days. History of lung malignancy with most recent chemotherapy 3 weeks ago. EXAM: CHEST  2 VIEW COMPARISON:  Chest x-ray of Dec 24, 2016 FINDINGS: The lungs are well-expanded. There  is minimal pleural thickening along the right lower lateral thoracic wall with similar findings on the left. This is stable. No pulmonary parenchymal nodules or masses are observed. There is no pleural effusion. The heart is top-normal in size. The pulmonary vascularity is normal. There is soft tissue fullness in the right paratracheal region which is stable and likely vascular. The power port catheter tip projects over the midportion of the SVC. IMPRESSION: Fairly stable appearance of the chest since the study of Dec 24, 2016. No evidence of pneumonia, CHF, or progressive malignancy. Electronically Signed   By: David  Martinique M.D.   On: 05/15/2017 10:49    Assessment/Plan 1. Drug-induced hepatitis significant elevation of LFT. ALP 1000,AST and ALT in 300-500s. No abdominal pain. Discussed with GI, recommend getting ultrasound with Doppler, hepatitis panel, INR, no other workup recommended. Discussed with on-call oncology, given the chronology of the events and appears drug-induced hepatitis from Nivolumab.  Treatment is 1 mg/kg prednisone equivalent dose, which will 75 mg prednisone, which will be 60 mg of IV solu-Medrol. We will rule out other etiologies of acute liver failure. Monitor daily LFT.  2. Chronic kidney disease. Renal function relatively stable. We'll monitor. Gentle IV hydration.  3. Cancer-related pain. Avoiding Tylenol. Changing Percocet 2 OxyIR.  4.chronic metabolic acidosis. Patient is on bicarbonate which I will continue.  5.protein calorie malnutrition. Monitor for now. dietary consult.  Nutrition: regular diet DVT Prophylaxis: subcutaneous Heparin  Advance goals of care discussion: full code   Consults: oncolohy  Family Communication: family was present at bedside, at the time of interview.  Opportunity was given to ask question and all questions were answered satisfactorily.  Disposition: Admitted as inpatient, med-surge unit. Likely to be discharged home,  in 3 days.  Author: Berle Mull, MD [facility] Pager: [phone] 05/15/2017  If 7PM-7AM, please contact night-coverage [email or web address] Password [email or web address]

## 2017-05-15 NOTE — Progress Notes (Signed)
[place] with Diane RN about pt's vitals, reports of 'feeling terrible' and inc SOB (switched from 2L [place]Pe[place] at home to 3L), diarrhea, inc lower back pain, and elevated liver enzymes.  MD Julien Nordmann will get back to [place] with his decision of whether or not to treat pt today.  0938- Per MD Julien Nordmann pt will be transferred to ED for further assessment.  Pt will not receive chemo tx today.  0944- Report given to ED Charge RN [facility] via phone.  1010- Pt roomed in [place], Matt RN at bedside.

## 2017-05-15 NOTE — ED Provider Notes (Signed)
[facility] DEPT Provider Note   CSN: [id number] Arrival date & time: 05/15/17  8768     History   Chief Complaint Chief Complaint  Patient presents with  . Abnormal Lab  . Shortness of Breath    HPI Francisco Love is a 56 y.o. male.  HPI  Patient presents due to concern of fever, chills, nausea, anorexia, dyspnea. Patient has multiple medical issues, most notably 2 distinct primary malignancies.  Patient last received chemotherapy 3 weeks ago, has been receiving therapy for approximately 2 years for lung cancer. Patient also receives ongoing therapy, via injection for prostate cancer.   He notes that over the past week or so he has had progressive weakness, anorexia, nausea, with frequent diarrhea. Additionally, the patient has increasing dyspnea, at rest and with exertion, but no chest pain, no syncope. Pain is in a typical distribution, back, legs, unchanged.  Past Medical History:  Diagnosis Date  . Chronic fatigue 04/12/2016  . Chronic pain 04/12/2016  . Chronic renal disease, stage III   . Metastatic cancer [facility])     Patient Active Problem List   Diagnosis Date Noted  . Cystic mass of pancreas   . Goals of care, counseling/discussion 03/27/2017  . PSVT (paroxysmal supraventricular tachycardia) ([place]) 12/24/2016  . Tachycardia 12/21/2016  . Tachypnea 12/21/2016  . Metabolic acidosis, NAG, bicarbonate losses 12/21/2016  . CKD (chronic kidney disease) stage 4, 12/21/2016  . Chronic pain 04/12/2016  . Chronic fatigue 04/12/2016  . Neoplasm related pain 06/08/2015  . Encounter for antineoplastic immunotherapy 05/11/2015  . Encounter for antineoplastic chemotherapy 01/24/2015  . Non-small cell cancer of right lung ([place]) 10/06/2014  . Penile lesion 09/28/2014  . Tobacco abuse 09/28/2014  . Protein-calorie malnutrition ([place]) 09/28/2014  . Debility 09/23/2014  . Acute respiratory failure with hypoxia ([place]) 09/23/2014  . Mediastinal lymphadenopathy   .  Metastatic adenocarcinoma involving skeletal bone with unknown primary site [facility])   . Lung mass 09/18/2014  . Shortness of breath 09/18/2014    Past Surgical History:  Procedure Laterality Date  . APPENDECTOMY    . EUS N/A 03/28/2017   Procedure: UPPER ENDOSCOPIC ULTRASOUND (EUS) RADIAL;  Surgeon: Milus Banister, MD;  Location: [facility];  Service: Endoscopy;  Laterality: N/A;  . VIDEO BRONCHOSCOPY N/A 09/21/2014   Procedure: VIDEO BRONCHOSCOPY WITH FLUORO;  Surgeon: Rigoberto Noel, MD;  Location: [facility];  Service: Cardiopulmonary;  Laterality: N/A;  . VIDEO BRONCHOSCOPY WITH ENDOBRONCHIAL ULTRASOUND N/A 09/23/2014   Procedure: VIDEO BRONCHOSCOPY WITH ENDOBRONCHIAL ULTRASOUND;  Surgeon: Rigoberto Noel, MD;  Location: [place];  Service: Pulmonary;  Laterality: N/A;       Home Medications    Prior to Admission medications   Medication Sig Start Date End Date Taking? Authorizing Provider  albuterol (PROVENTIL) (2.5 MG/3ML) 0.083% nebulizer solution Take 3 mLs (2.5 mg total) by nebulization every 2 (two) hours as needed for wheezing. 09/24/14   Delfina Redwood, MD  AMITIZA 24 MCG capsule Take 24 mcg by mouth 2 (two) times daily with a meal.  04/06/15   [provider]  augmented betamethasone dipropionate (DIPROLENE-AF) 0.05 % ointment  04/16/17   [provider]  betamethasone dipropionate (DIPROLENE) 0.05 % ointment Apply twice daily to affected areas as needed for itching 04/15/17   [provider]  bicalutamide (CASODEX) 50 MG tablet Take 50 mg by mouth daily.  09/08/15   [provider]  ciprofloxacin (CIPRO) 500 MG tablet Take 1 tablet (500 mg total) by mouth 2 (two)  times daily. 03/28/17   Milus Banister, MD  diphenhydrAMINE (BENADRYL) 25 MG tablet Take 25 mg by mouth at bedtime as needed for itching.     [provider]  doxycycline (VIBRA-TABS) 100 MG tablet TAKE 1 TABLET BY MOUTH ONCE DAILY 04/25/17   [provider]  doxycycline  (VIBRA-TABS) 100 MG tablet  04/18/17   [provider]  folic acid (FOLVITE) 1 MG tablet Take 1 mg by mouth daily.    [provider]  furosemide (LASIX) 20 MG tablet Take 1 tablet (20 mg total) by mouth daily as needed for fluid or edema. 12/24/16   Florencia Reasons, MD  levocetirizine (XYZAL) 5 MG tablet Take 5 mg by mouth daily.  11/30/15 03/28/17  [provider]  lidocaine-prilocaine (EMLA) cream Apply 1 application topically as needed. Apply to port site prior to chemotherapy. 11/07/16   Curt Bears, MD  magnesium oxide (MAG-OX) 400 MG tablet Take 400 mg by mouth daily.    [provider]  metoprolol tartrate (LOPRESSOR) 25 MG tablet Take 0.5 tablets (12.5 mg total) by mouth 2 (two) times daily. 12/24/16   Florencia Reasons, MD  mirtazapine (REMERON) 15 MG tablet Take 1 tablet (15 mg total) by mouth at bedtime. 12/19/16   Curt Bears, MD  oxyCODONE-acetaminophen (PERCOCET/ROXICET) 5-325 MG tablet Take 1 tablet by mouth every 6 (six) hours as needed for severe pain. 05/08/17   Curt Bears, MD  polyethylene glycol [facility] Floria Raveling) packet Take 17 g by mouth daily as needed for mild constipation.     [provider]  prochlorperazine (COMPAZINE) 10 MG tablet Take 1 tablet (10 mg total) by mouth every 6 (six) hours as needed for nausea or vomiting. 10/22/14   Curt Bears, MD  senna-docusate (SENOKOT-S) 8.6-50 MG per tablet Take 1 tablet by mouth daily. 10/19/14   Lauree Chandler, NP  sodium bicarbonate 650 MG tablet Take 1 tablet (650 mg total) by mouth daily. 12/24/16   Florencia Reasons, MD  SUMAtriptan (IMITREX) 25 MG tablet Take 25 mg by mouth every 2 (two) hours as needed for migraine.  05/31/16   [provider]  tamsulosin (FLOMAX) 0.4 MG CAPS capsule Take 0.4 mg by mouth daily.  03/29/15   [provider]  valACYclovir (VALTREX) 500 MG tablet Take 500 mg by mouth 2 (two) times daily. 03/08/17   [provider]  XTANDI 40 MG capsule Take 160  mg by mouth every evening.  07/24/16   [provider]    Family History Family History  Problem Relation Age of Onset  . Lung cancer Brother     Social History Social History  Substance Use Topics  . Smoking status: Former Smoker    Packs/day: 1.00    Types: Cigarettes    Quit date: 06/20/2014  . Smokeless tobacco: Never Used  . Alcohol use 3.6 oz/week    6 Cans of beer per week     Allergies   Patient has no known allergies.   Review of Systems Review of Systems  Constitutional:       Per HPI, otherwise negative  HENT:       Per HPI, otherwise negative  Respiratory:       Per HPI, otherwise negative  Cardiovascular:       Per HPI, otherwise negative  Gastrointestinal: Positive for diarrhea. Negative for abdominal pain and vomiting.  Endocrine:       Negative aside from HPI  Genitourinary:  Neg aside from HPI   Musculoskeletal:       Per HPI, otherwise negative  Skin: Negative.   Allergic/Immunologic: Positive for immunocompromised state.  Neurological: Positive for weakness. Negative for syncope.     Physical Exam Updated Vital Signs BP 101/82 (BP Location: Left Arm)   Pulse 97   Temp 97.9 F (36.6 C) (Oral)   Resp 18   SpO2 99%   Physical Exam  Constitutional: He is oriented to person, place, and time. No distress.  Sickly appearing male awake, alert, speaking clearly  HENT:  Head: Normocephalic and atraumatic.  Eyes: Conjunctivae and EOM are normal.  Cardiovascular: Normal rate and regular rhythm.   Pulmonary/Chest: Effort normal. No stridor. No respiratory distress.  Abdominal: He exhibits no distension.  Musculoskeletal: He exhibits no edema.  Neurological: He is alert and oriented to person, place, and time.  Skin: Skin is warm and dry.  Psychiatric: He has a normal mood and affect.  Nursing note and vitals reviewed.    ED Treatments / Results  Labs (all labs ordered are listed, but only abnormal results are  displayed) Labs Reviewed  COMPREHENSIVE METABOLIC PANEL - Abnormal; Notable for the following:       Result Value   Chloride 116 (*)    CO2 14 (*)    Glucose, Bld 108 (*)    BUN 42 (*)    Creatinine, Ser 2.25 (*)    Calcium 8.8 (*)    Albumin 2.9 (*)    AST 586 (*)    ALT 339 (*)    Alkaline Phosphatase 1,001 (*)    GFR calc non Af Amer 30 (*)    GFR calc Af Amer 35 (*)    All other components within normal limits  CBC WITH DIFFERENTIAL/PLATELET - Abnormal; Notable for the following:    RBC 3.69 (*)    Hemoglobin 12.0 (*)    HCT 34.9 (*)    RDW 18.9 (*)    Monocytes Absolute 1.1 (*)    All other components within normal limits  URINALYSIS, ROUTINE W REFLEX MICROSCOPIC - Abnormal; Notable for the following:    Hgb urine dipstick SMALL (*)    Protein, ur 100 (*)    All other components within normal limits  APTT - Abnormal; Notable for the following:    aPTT 75 (*)    All other components within normal limits  CULTURE, BLOOD (ROUTINE X 2)  CULTURE, BLOOD (ROUTINE X 2)  CBC  CREATININE, SERUM  HEPATITIS PANEL, ACUTE  ACETAMINOPHEN LEVEL  ETHANOL  PROTIME-INR  RAPID URINE DRUG SCREEN, HOSP PERFORMED  GAMMA GT  I-STAT CG4 LACTIC ACID, ED   Radiology Ct Abdomen Pelvis Wo Contrast  Result Date: 05/15/2017 CLINICAL DATA:  Hypoxia. Chemotherapy treatment 3 weeks ago. Metastatic lung cancer. Increased shortness of breath. Elevated creatinine. Vomiting and diarrhea. EXAM: CT ABDOMEN AND PELVIS WITHOUT CONTRAST TECHNIQUE: Multidetector CT imaging of the abdomen and pelvis was performed following the standard protocol without IV contrast. COMPARISON:  CT abdomen 03/27/2017 FINDINGS: Lower chest: Stable chronic bilateral pleural thickening. Centrilobular emphysema. Indistinctly marginated ground-glass density in the right middle lobe laterally, potentially from mild alveolitis or edema. Hepatobiliary: 0.9 cm cyst laterally in the lateral segment left hepatic lobe, no change from  09/20/2014. No significant liver lesion identified. Gallbladder mildly contracted. Pancreas: Faint hypodensity in the pancreatic head with adjacent calcifications, chronically stable, possibly related to chronic calcific pancreatitis, and not definitely changed from 09/20/2014. Spleen: Unremarkable Adrenals/Urinary Tract: Duplicated right collecting  system. Atrophic left kidney. Adrenal glands unremarkable. Stomach/Bowel: Scattered air-fluid levels in borderline dilated loops of small bowel, no obvious transition point. Vascular/Lymphatic: Aortoiliac atherosclerotic vascular disease. Chronically stable stranding around the proximal common iliac arteries, not changed from 2016. No pathologic adenopathy. Reproductive: Unremarkable Other: No supplemental non-categorized findings. Musculoskeletal: Extensive osseous metastatic disease with similar periostitis along the left iliac bone and a similar distribution of lesions. Markedly severe arthropathy of both hips. No new bony metastatic disease is identified. Direct left inguinal hernia containing adipose tissue noted. IMPRESSION: 1. There is some new faint ground-glass opacity anteriorly in the right middle lobe potentially from low-grade alveolitis. Underlying emphysema noted along with chronic pleural thickening. 2. Borderline dilated small bowel loops containing scattered air-fluid levels, query diffuse small bowel ileus. I do not see a definite lead point for obstruction. 3. Stable extensive osseous metastatic disease, with similar periostitis along the left iliac bone. 4. Other imaging findings of potential clinical significance: Chronic calcific in cystic lesion in the pancreatic head. Duplicated right renal collecting system. Atrophic left kidney. Aortic Atherosclerosis (ICD10-I70.0). Chronic indistinct density along the aortic bifurcation and proximal common iliac vessels. Severe bilateral hip arthropathy. Direct left inguinal hernia containing adipose tissue.  Electronically Signed   By: Van Clines M.D.   On: 05/15/2017 13:52   Dg Chest 2 View  Result Date: 05/15/2017 CLINICAL DATA:  Increase weakness and shortness of breath over the past several days. History of lung malignancy with most recent chemotherapy 3 weeks ago. EXAM: CHEST  2 VIEW COMPARISON:  Chest x-ray of Dec 24, 2016 FINDINGS: The lungs are well-expanded. There is minimal pleural thickening along the right lower lateral thoracic wall with similar findings on the left. This is stable. No pulmonary parenchymal nodules or masses are observed. There is no pleural effusion. The heart is top-normal in size. The pulmonary vascularity is normal. There is soft tissue fullness in the right paratracheal region which is stable and likely vascular. The power port catheter tip projects over the midportion of the SVC. IMPRESSION: Fairly stable appearance of the chest since the study of Dec 24, 2016. No evidence of pneumonia, CHF, or progressive malignancy. Electronically Signed   By: David  Martinique M.D.   On: 05/15/2017 10:49    Procedures Procedures (including critical care time)  Medications Ordered in ED Medications  albuterol (PROVENTIL) (2.5 MG/3ML) 0.083% nebulizer solution 2.5 mg (not administered)  lubiprostone (AMITIZA) capsule 24 mcg (not administered)  folic acid (FOLVITE) tablet 1 mg (not administered)  mirtazapine (REMERON) tablet 15 mg (not administered)  prochlorperazine (COMPAZINE) tablet 10 mg (not administered)  senna-docusate (Senokot-S) tablet 1 tablet (not administered)  sodium bicarbonate tablet 650 mg (not administered)  tamsulosin (FLOMAX) capsule 0.4 mg (not administered)  valACYclovir (VALTREX) tablet 500 mg (not administered)  enzalutamide (XTANDI) capsule 160 mg (not administered)  heparin injection 5,000 Units (not administered)  0.9 %  sodium chloride infusion (not administered)  oxyCODONE (Oxy IR/ROXICODONE) immediate release tablet 5 mg (not administered)   polyethylene glycol (MIRALAX / GLYCOLAX) packet 17 g (not administered)  ondansetron (ZOFRAN) tablet 4 mg (not administered)    Or  ondansetron (ZOFRAN) injection 4 mg (not administered)  sodium chloride 0.9 % bolus 1,000 mL (0 mLs Intravenous Stopped 05/15/17 1230)  oxyCODONE-acetaminophen (PERCOCET/ROXICET) 5-325 MG per tablet 1 tablet (1 tablet Oral Given 05/15/17 1055)  iopamidol (ISOVUE-300) 61 % injection (30 mLs  Contrast Given 05/15/17 1212)     Initial Impression / Assessment and Plan / ED Course  I  have reviewed the triage vital signs and the nursing notes.  Pertinent labs & imaging results that were available during my care of the patient were reviewed by me and considered in my medical decision making (see chart for details).  Chart review performed, notable for ongoing therapy at 2 different locations for his 2 distinct malignancies. Last chemotherapy assessment as below:  DIAGNOSIS:  1) Stage IV (T2b, N2, M1b) non-small cell lung cancer, adenocarcinoma with negative EGFR mutation and negative gene translocation presented with a large right hilar mass as well as mediastinal lymphadenopathy and metastatic disease to the liver, bone and pancreas diagnosed in February 2016. 2) prostate adenocarcinoma diagnosed at [facility] with Gleason score 9 (4+5).  On repeat exam after CT results were available the patient is awake and alert. I discussed all findings with him and his sister. We discussed the continued abnormal liver function, ileus, and his cancer. Given the persistent/worsening hepatic function discussed patient's case with his oncologist, and we agreed the patient requires admission for further evaluation and management. No findings requiring emergent surgical consult, but there is need for nonemergent GI consult given the patient's hepatic function. Again, patient requires admission for further evaluation and management.  Final Clinical Impressions(s) / ED Diagnoses   Hepatic dysfunction   Carmin Muskrat, MD 05/15/17 1701

## 2017-05-15 NOTE — ED Notes (Signed)
Patient given bedside commode and urinal for samples. Will call when ready

## 2017-05-15 NOTE — ED Notes (Signed)
Bed: PX10 Expected date:  Expected time:  Means of arrival:  Comments: Pt from ca center   Clemens Catholic, RN 05/15/17 1002

## 2017-05-15 NOTE — ED Notes (Signed)
Patient transported to X-ray 

## 2017-05-16 DIAGNOSIS — Z923 Personal history of irradiation: Secondary | ICD-10-CM | POA: Diagnosis not present

## 2017-05-16 DIAGNOSIS — C3491 Malignant neoplasm of unspecified part of right bronchus or lung: Secondary | ICD-10-CM | POA: Diagnosis present

## 2017-05-16 DIAGNOSIS — C61 Malignant neoplasm of prostate: Secondary | ICD-10-CM | POA: Diagnosis present

## 2017-05-16 DIAGNOSIS — R748 Abnormal levels of other serum enzymes: Secondary | ICD-10-CM | POA: Diagnosis present

## 2017-05-16 DIAGNOSIS — Z6824 Body mass index (BMI) 24.0-24.9, adult: Secondary | ICD-10-CM | POA: Diagnosis not present

## 2017-05-16 DIAGNOSIS — E872 Acidosis: Secondary | ICD-10-CM | POA: Diagnosis present

## 2017-05-16 DIAGNOSIS — E46 Unspecified protein-calorie malnutrition: Secondary | ICD-10-CM | POA: Diagnosis present

## 2017-05-16 DIAGNOSIS — N184 Chronic kidney disease, stage 4 (severe): Secondary | ICD-10-CM | POA: Diagnosis present

## 2017-05-16 DIAGNOSIS — Z9221 Personal history of antineoplastic chemotherapy: Secondary | ICD-10-CM | POA: Diagnosis not present

## 2017-05-16 DIAGNOSIS — J9610 Chronic respiratory failure, unspecified whether with hypoxia or hypercapnia: Secondary | ICD-10-CM | POA: Diagnosis present

## 2017-05-16 DIAGNOSIS — C7951 Secondary malignant neoplasm of bone: Secondary | ICD-10-CM | POA: Diagnosis present

## 2017-05-16 DIAGNOSIS — T451X5A Adverse effect of antineoplastic and immunosuppressive drugs, initial encounter: Secondary | ICD-10-CM | POA: Diagnosis present

## 2017-05-16 DIAGNOSIS — Z87891 Personal history of nicotine dependence: Secondary | ICD-10-CM | POA: Diagnosis not present

## 2017-05-16 DIAGNOSIS — K716 Toxic liver disease with hepatitis, not elsewhere classified: Secondary | ICD-10-CM | POA: Diagnosis not present

## 2017-05-16 DIAGNOSIS — G893 Neoplasm related pain (acute) (chronic): Secondary | ICD-10-CM | POA: Diagnosis present

## 2017-05-16 DIAGNOSIS — Z801 Family history of malignant neoplasm of trachea, bronchus and lung: Secondary | ICD-10-CM | POA: Diagnosis not present

## 2017-05-16 DIAGNOSIS — T50905A Adverse effect of unspecified drugs, medicaments and biological substances, initial encounter: Secondary | ICD-10-CM | POA: Diagnosis not present

## 2017-05-16 DIAGNOSIS — K7589 Other specified inflammatory liver diseases: Secondary | ICD-10-CM | POA: Diagnosis present

## 2017-05-16 LAB — CBC WITH DIFFERENTIAL/PLATELET
Basophils Absolute: 0 10*3/uL (ref 0.0–0.1)
Basophils Relative: 0 %
Eosinophils Absolute: 0 10*3/uL (ref 0.0–0.7)
Eosinophils Relative: 0 %
HEMATOCRIT: 31.5 % — AB (ref 39.0–52.0)
HEMOGLOBIN: 10.7 g/dL — AB (ref 13.0–17.0)
LYMPHS ABS: 1.3 10*3/uL (ref 0.7–4.0)
LYMPHS PCT: 21 %
MCH: 31.8 pg (ref 26.0–34.0)
MCHC: 34 g/dL (ref 30.0–36.0)
MCV: 93.5 fL (ref 78.0–100.0)
MONOS PCT: 15 %
Monocytes Absolute: 0.9 10*3/uL (ref 0.1–1.0)
NEUTROS ABS: 3.9 10*3/uL (ref 1.7–7.7)
NEUTROS PCT: 64 %
PLATELETS: 262 10*3/uL (ref 150–400)
RBC: 3.37 MIL/uL — ABNORMAL LOW (ref 4.22–5.81)
RDW: 19 % — ABNORMAL HIGH (ref 11.5–15.5)
WBC: 6.1 10*3/uL (ref 4.0–10.5)

## 2017-05-16 LAB — COMPREHENSIVE METABOLIC PANEL
ALK PHOS: 969 U/L — AB (ref 38–126)
ALT: 304 U/L — AB (ref 17–63)
AST: 482 U/L — ABNORMAL HIGH (ref 15–41)
Albumin: 2.7 g/dL — ABNORMAL LOW (ref 3.5–5.0)
Anion gap: 9 (ref 5–15)
BUN: 37 mg/dL — ABNORMAL HIGH (ref 6–20)
CALCIUM: 8.6 mg/dL — AB (ref 8.9–10.3)
CHLORIDE: 115 mmol/L — AB (ref 101–111)
CO2: 14 mmol/L — ABNORMAL LOW (ref 22–32)
CREATININE: 1.84 mg/dL — AB (ref 0.61–1.24)
GFR, EST AFRICAN AMERICAN: 45 mL/min — AB (ref >60)
GFR, EST NON AFRICAN AMERICAN: 39 mL/min — AB (ref >60)
Glucose, Bld: 117 mg/dL — ABNORMAL HIGH (ref 65–99)
Potassium: 5.1 mmol/L (ref 3.5–5.1)
Sodium: 138 mmol/L (ref 135–145)
TOTAL PROTEIN: 6.7 g/dL (ref 6.5–8.1)
Total Bilirubin: 0.9 mg/dL (ref 0.3–1.2)

## 2017-05-16 LAB — GAMMA GT: GGT: 661 U/L — ABNORMAL HIGH (ref 7–50)

## 2017-05-16 NOTE — Progress Notes (Addendum)
[facility] Progress Note  Patient: Francisco Love AJO:[id number]   PCP: Curt Bears, MD DOB: June 16, 1961   DOA: 05/15/2017   DOS: 05/16/2017   Date of Service: the patient was seen and examined on 05/16/2017  Subjective: feeling better, no abdominal pain, nausea or vomiting.   Brief hospital course: Pt. with PMH of of metastatic adenocarcinoma of the right lung with bone metastasis, chronic respiratory failure, prostate cancer, chronic kidney disease stage III.; admitted on 05/15/2017, presented with complaint of fatigue, was found to have drug induced hepatitis. Currently further plan is continue IV steroids.  Assessment and Plan: 1. Drug-induced hepatitis significant elevation of LFT. ALP 1000,AST and ALT in 300-500s. Marginally better today No abdominal pain. Discussed with GI, recommend getting ultrasound with Doppler, impression: Normal liver duplex examination. Portal vein is patent with normal direction of flow. hepatitis panel pending Normal INR, no other workup recommended. Discussed with on-call oncology, given the chronology of the events it appears drug-induced hepatitis from Nivolumab.  Treatment is 1 mg/kg prednisone equivalent dose, which will 75 mg prednisone, which will be 60 mg of IV solu-Medrol. Monitor daily LFT. Need to be monitored for 3 days for stable improvement in LFT, if does not improve, may need celcept.  2. Chronic kidney disease. Renal function relatively stable. We'll monitor. Gentle IV hydration.  3. Cancer-related pain. Avoiding Tylenol. Changing Percocet 2 OxyIR.  4.chronic metabolic acidosis. Patient is on bicarbonate which I will continue.  5.protein calorie malnutrition. Monitor for now. dietary consult.  Diet: regular diet DVT Prophylaxis: subcutaneous Heparin  Advance goals of care discussion: full code  Family Communication: no family was present at bedside, at the time of interview.   Disposition:  Discharge to  home.  Consultants: oncology  Procedures: none  Antibiotics: Anti-infectives    Start     Dose/Rate Route Frequency Ordered Stop   05/15/17 2200  valACYclovir (VALTREX) tablet 500 mg     500 mg Oral 2 times daily 05/15/17 1648         Objective: Physical Exam: Vitals:   05/15/17 1742 05/15/17 2105 05/16/17 0541 05/16/17 1600  BP: 107/74 115/69 135/85 121/89  Pulse: 79 72 65 80  Resp: (!) 33 20 20 18   Temp:  98.4 F (36.9 C) 97.6 F (36.4 C) 98.1 F (36.7 C)  TempSrc:  Oral Oral Oral  SpO2: 95% 97% 100% 98%    Intake/Output Summary (Last 24 hours) at 05/16/17 1615 Last data filed at 05/16/17 1500  Gross per 24 hour  Intake           1057.5 ml  Output              850 ml  Net            207.5 ml   There were no vitals filed for this visit. General: Alert, Awake and Oriented to Time, Place and Person. Appear in mild distress, affect appropriate Eyes: PERRL, Conjunctiva normal ENT: Oral Mucosa clear moist. Neck: no JVD, no Abnormal Mass Or lumps Cardiovascular: S1 and S2 Present, no Murmur, Peripheral Pulses Present Respiratory: normal respiratory effort, Bilateral Air entry equal and Decreased, no use of accessory muscle, Clear to Auscultation, no Crackles, no wheezes Abdomen: Bowel Sound present, Soft and no tenderness, no hernia Skin: on redness, no Rash, no induration Extremities: no Pedal edema, no calf tenderness Neurologic: Grossly no focal neuro deficit. Bilaterally Equal motor strength  Data Reviewed: CBC:  Recent Labs Lab 05/15/17 0751 05/15/17 1023 05/16/17 0600  WBC  6.8 7.7 6.1  NEUTROABS 4.1 4.7 3.9  HGB 12.7* 12.0* 10.7*  HCT 38.4 34.9* 31.5*  MCV 98.2* 94.6 93.5  PLT 283 281 502   Basic Metabolic Panel:  Recent Labs Lab 05/15/17 0751 05/15/17 1023 05/16/17 0600  NA 143 139 138  K 4.8 4.3 5.1  CL  --  116* 115*  CO2 15* 14* 14*  GLUCOSE 117 108* 117*  BUN 40.0* 42* 37*  CREATININE 2.4* 2.25* 1.84*  CALCIUM 9.5 8.8* 8.6*    Liver  Function Tests:  Recent Labs Lab 05/15/17 0751 05/15/17 1023 05/16/17 0600  AST 645* 586* 482*  ALT 401* 339* 304*  ALKPHOS 1,139* 1,001* 969*  BILITOT 0.99 0.8 0.9  PROT 7.8 7.2 6.7  ALBUMIN 2.8* 2.9* 2.7*   No results for input(s): LIPASE, AMYLASE in the last 168 hours. No results for input(s): AMMONIA in the last 168 hours. Coagulation Profile:  Recent Labs Lab 05/15/17 1643  INR 1.17   Cardiac Enzymes: No results for input(s): CKTOTAL, CKMB, CKMBINDEX, TROPONINI in the last 168 hours. BNP (last 3 results) No results for input(s): PROBNP in the last 8760 hours. CBG: No results for input(s): GLUCAP in the last 168 hours. Studies: US Liver Doppler  Result Date: 05/16/2017 CLINICAL DATA:  Elevated liver function tests. History of lung and prostate cancer. EXAM: DUPLEX ULTRASOUND OF LIVER TECHNIQUE: Color and duplex Doppler ultrasound was performed to evaluate the hepatic in-flow and out-flow vessels. COMPARISON:  Ultrasound 05/15/2017 and CT 05/15/2017 FINDINGS: Portal Vein Velocities Main:  23 cm/sec Right:  12 cm/sec Left:  13 cm/sec Hepatic Vein Velocities Right:  40 cm/sec Middle:  18 cm/sec Left:  29 cm/sec Hepatic Artery Velocity:  98 cm/sec Splenic Vein Velocity:  12 cm/sec Varices: None Ascites: None Portal vein is patent with normal direction of flow towards the liver. Main portal vein measures 1.0 cm in diameter. Normal direction of flow in the hepatic veins away from the liver. Spleen is not enlarged. Spleen measures 6.4 x 3.6 x 8.3 cm. IMPRESSION: Normal liver duplex examination. Portal vein is patent with normal direction of flow. Electronically Signed   By: Markus Daft M.D.   On: 05/16/2017 08:03   US Abdomen Limited Ruq  Result Date: 05/15/2017 CLINICAL DATA:  Elevated liver function tests. Short of breath. History of lung carcinoma and prostate carcinoma. EXAM: ULTRASOUND ABDOMEN LIMITED RIGHT UPPER QUADRANT COMPARISON:  CT, 03/27/2017 FINDINGS: Gallbladder: No  gallstones or wall thickening visualized. No sonographic Murphy sign noted by sonographer. Common bile duct: Diameter: 7 mm Liver: Heterogeneous echogenicity. Normal in size with no discrete mass or focal lesion. Slight prominence of the intrahepatic bile ducts. Portal vein is patent on color Doppler imaging with normal direction of blood flow towards the liver. IMPRESSION: 1. Normal gallbladder. Mild dilation of common bile duct and intrahepatic bile ducts. Patient has chronic pancreatitis findings on the prior CT. 2. Heterogeneous echotexture of the liver without a discrete mass or focal lesion. Given the history of elevated liver function tests, consider follow-up liver MRI with and without contrast for further assessment. Electronically Signed   By: Lajean Manes M.D.   On: 05/15/2017 18:07    Scheduled Meds: . enzalutamide  160 mg Oral QPM  . folic acid  1 mg Oral Daily  . heparin  5,000 Units Subcutaneous Q8H  . lubiprostone  24 mcg Oral BID WC  . methylPREDNISolone (SOLU-MEDROL) injection  60 mg Intravenous Daily  . mirtazapine  15 mg Oral QHS  .  senna-docusate  1 tablet Oral Daily  . sodium bicarbonate  650 mg Oral Daily  . tamsulosin  0.4 mg Oral Daily  . valACYclovir  500 mg Oral BID   Continuous Infusions: . sodium chloride 75 mL/hr at 05/16/17 1054   PRN Meds: albuterol, ondansetron **OR** ondansetron (ZOFRAN) IV, oxyCODONE, polyethylene glycol, prochlorperazine  Time spent: 35 minutes  Author: Berle Mull, MD [facility] Pager: [phone] 05/16/2017 4:15 PM  If 7PM-7AM, please contact night-coverage at [email or web address], password [facility]

## 2017-05-17 LAB — HEPATIC FUNCTION PANEL
ALK PHOS: 889 U/L — AB (ref 38–126)
ALT: 226 U/L — AB (ref 17–63)
AST: 329 U/L — ABNORMAL HIGH (ref 15–41)
Albumin: 2.3 g/dL — ABNORMAL LOW (ref 3.5–5.0)
BILIRUBIN INDIRECT: 0.4 mg/dL (ref 0.3–0.9)
BILIRUBIN TOTAL: 0.6 mg/dL (ref 0.3–1.2)
Bilirubin, Direct: 0.2 mg/dL (ref 0.1–0.5)
TOTAL PROTEIN: 5.7 g/dL — AB (ref 6.5–8.1)

## 2017-05-17 LAB — CBC
HEMATOCRIT: 27.3 % — AB (ref 39.0–52.0)
HEMOGLOBIN: 9.4 g/dL — AB (ref 13.0–17.0)
MCH: 32 pg (ref 26.0–34.0)
MCHC: 34.4 g/dL (ref 30.0–36.0)
MCV: 92.9 fL (ref 78.0–100.0)
Platelets: 237 10*3/uL (ref 150–400)
RBC: 2.94 MIL/uL — ABNORMAL LOW (ref 4.22–5.81)
RDW: 19.1 % — ABNORMAL HIGH (ref 11.5–15.5)
WBC: 6.5 10*3/uL (ref 4.0–10.5)

## 2017-05-17 LAB — HEPATITIS PANEL, ACUTE
HCV Ab: <0.1 s/co ratio (ref 0.0–0.9)
HEP B C IGM: NEGATIVE
Hep A IgM: NEGATIVE
Hepatitis B Surface Ag: NEGATIVE

## 2017-05-17 LAB — BASIC METABOLIC PANEL
ANION GAP: 6 (ref 5–15)
BUN: 31 mg/dL — ABNORMAL HIGH (ref 6–20)
CHLORIDE: 116 mmol/L — AB (ref 101–111)
CO2: 16 mmol/L — AB (ref 22–32)
CREATININE: 1.62 mg/dL — AB (ref 0.61–1.24)
Calcium: 8.3 mg/dL — ABNORMAL LOW (ref 8.9–10.3)
GFR calc non Af Amer: 45 mL/min — ABNORMAL LOW (ref >60)
GFR, EST AFRICAN AMERICAN: 52 mL/min — AB (ref >60)
Glucose, Bld: 81 mg/dL (ref 65–99)
Potassium: 4.3 mmol/L (ref 3.5–5.1)
Sodium: 138 mmol/L (ref 135–145)

## 2017-05-17 LAB — MAGNESIUM: Magnesium: 1.8 mg/dL (ref 1.7–2.4)

## 2017-05-17 NOTE — Progress Notes (Signed)
[facility] Progress Note  Patient: Francisco Love VQM:[id number]   PCP: Curt Bears, MD DOB: 16-Aug-1961   DOA: 05/15/2017   DOS: 05/17/2017   Date of Service: the patient was seen and examined on 05/17/2017  Subjective: Feeling better, eating okay, no nausea no vomiting. No abdominal pain.  Brief hospital course: Pt. with PMH of of metastatic adenocarcinoma of the right lung with bone metastasis, chronic respiratory failure, prostate cancer, chronic kidney disease stage III.; admitted on 05/15/2017, presented with complaint of fatigue, was found to have drug induced hepatitis. Currently further plan is continue IV steroids.  Assessment and Plan: 1. Drug-induced hepatitis significant elevation of LFT. ALP 1000,AST and ALT in 300-500s. Marginally better today No abdominal pain. Discussed with GI, recommend getting ultrasound with Doppler, impression: Normal liver duplex examination. Portal vein is patent with normal direction of flow. hepatitis panel pending Normal INR, no other workup recommended. Discussed with on-call oncology, given the chronology of the events it appears drug-induced hepatitis from Nivolumab.  Treatment is 1 mg/kg prednisone equivalent dose, which will 75 mg prednisone, which will be 60 mg of IV solu-Medrol. Monitor daily LFT. Need to be monitored for 3 days for stable improvement in LFT, if does not improve, may need celcept.  2. Chronic kidney disease. Renal function relatively stable. We'll monitor. Gentle IV hydration.  3. Cancer-related pain. Avoiding Tylenol. Changing Percocet 2 OxyIR.  4.chronic metabolic acidosis. Patient is on bicarbonate which I will continue.  5.protein calorie malnutrition. Monitor for now. dietary consult.  Diet: regular diet DVT Prophylaxis: subcutaneous Heparin  Advance goals of care discussion: full code  Family Communication: no family was present at bedside, at the time of interview.   Disposition:    Discharge to home.  Consultants: oncology  Procedures: none  Antibiotics: Anti-infectives    Start     Dose/Rate Route Frequency Ordered Stop   05/15/17 2200  valACYclovir (VALTREX) tablet 500 mg     500 mg Oral 2 times daily 05/15/17 1648         Objective: Physical Exam: Vitals:   05/16/17 1600 05/16/17 2024 05/17/17 0501 05/17/17 1549  BP: 121/89 127/89 (!) 149/88 (!) 142/91  Pulse: 80 80 70 91  Resp: 18 20 20 18   Temp: 98.1 F (36.7 C) 98.1 F (36.7 C) 98 F (36.7 C) 98.8 F (37.1 C)  TempSrc: Oral Oral Oral Oral  SpO2: 98% 99% 100% 100%    Intake/Output Summary (Last 24 hours) at 05/17/17 1941 Last data filed at 05/17/17 1920  Gross per 24 hour  Intake              600 ml  Output             2300 ml  Net            -1700 ml   There were no vitals filed for this visit. General: Alert, Awake and Oriented to Time, Place and Person. Appear in mild distress, affect appropriate Eyes: PERRL, Conjunctiva normal ENT: Oral Mucosa clear moist. Neck: no JVD, no Abnormal Mass Or lumps Cardiovascular: S1 and S2 Present, no Murmur, Peripheral Pulses Present Respiratory: normal respiratory effort, Bilateral Air entry equal and Decreased, no use of accessory muscle, Clear to Auscultation, no Crackles, no wheezes Abdomen: Bowel Sound present, Soft and no tenderness, no hernia Skin: on redness, no Rash, no induration Extremities: no Pedal edema, no calf tenderness Neurologic: Grossly no focal neuro deficit. Bilaterally Equal motor strength  Data Reviewed: CBC:  Recent Labs  Lab 05/15/17 0751 05/15/17 1023 05/16/17 0600 05/17/17 0500  WBC 6.8 7.7 6.1 6.5  NEUTROABS 4.1 4.7 3.9  --   HGB 12.7* 12.0* 10.7* 9.4*  HCT 38.4 34.9* 31.5* 27.3*  MCV 98.2* 94.6 93.5 92.9  PLT 283 281 262 657   Basic Metabolic Panel:  Recent Labs Lab 05/15/17 0751 05/15/17 1023 05/16/17 0600 05/17/17 0500  NA 143 139 138 138  K 4.8 4.3 5.1 4.3  CL  --  116* 115* 116*  CO2 15* 14*  14* 16*  GLUCOSE 117 108* 117* 81  BUN 40.0* 42* 37* 31*  CREATININE 2.4* 2.25* 1.84* 1.62*  CALCIUM 9.5 8.8* 8.6* 8.3*  MG  --   --   --  1.8    Liver Function Tests:  Recent Labs Lab 05/15/17 0751 05/15/17 1023 05/16/17 0600 05/17/17 0500  AST 645* 586* 482* 329*  ALT 401* 339* 304* 226*  ALKPHOS 1,139* 1,001* 969* 889*  BILITOT 0.99 0.8 0.9 0.6  PROT 7.8 7.2 6.7 5.7*  ALBUMIN 2.8* 2.9* 2.7* 2.3*   No results for input(s): LIPASE, AMYLASE in the last 168 hours. No results for input(s): AMMONIA in the last 168 hours. Coagulation Profile:  Recent Labs Lab 05/15/17 1643  INR 1.17   Cardiac Enzymes: No results for input(s): CKTOTAL, CKMB, CKMBINDEX, TROPONINI in the last 168 hours. BNP (last 3 results) No results for input(s): PROBNP in the last 8760 hours. CBG: No results for input(s): GLUCAP in the last 168 hours. Studies: No results found.  Scheduled Meds: . enzalutamide  160 mg Oral QPM  . folic acid  1 mg Oral Daily  . heparin  5,000 Units Subcutaneous Q8H  . lubiprostone  24 mcg Oral BID WC  . methylPREDNISolone (SOLU-MEDROL) injection  60 mg Intravenous Daily  . mirtazapine  15 mg Oral QHS  . senna-docusate  1 tablet Oral Daily  . sodium bicarbonate  650 mg Oral Daily  . tamsulosin  0.4 mg Oral Daily  . valACYclovir  500 mg Oral BID   Continuous Infusions: . sodium chloride 75 mL/hr at 05/17/17 1302   PRN Meds: albuterol, ondansetron **OR** ondansetron (ZOFRAN) IV, oxyCODONE, polyethylene glycol, prochlorperazine  Time spent: 30 minutes  Author: Berle Mull, MD [facility] Pager: [phone] 05/17/2017 7:41 PM  If 7PM-7AM, please contact night-coverage at [email or web address], password [facility]

## 2017-05-18 LAB — CBC
HCT: 27.3 % — ABNORMAL LOW (ref 39.0–52.0)
Hemoglobin: 9.3 g/dL — ABNORMAL LOW (ref 13.0–17.0)
MCH: 31.8 pg (ref 26.0–34.0)
MCHC: 34.1 g/dL (ref 30.0–36.0)
MCV: 93.5 fL (ref 78.0–100.0)
PLATELETS: 224 10*3/uL (ref 150–400)
RBC: 2.92 MIL/uL — ABNORMAL LOW (ref 4.22–5.81)
RDW: 18.9 % — AB (ref 11.5–15.5)
WBC: 6.6 10*3/uL (ref 4.0–10.5)

## 2017-05-18 LAB — COMPREHENSIVE METABOLIC PANEL
ALBUMIN: 2.2 g/dL — AB (ref 3.5–5.0)
ALT: 209 U/L — ABNORMAL HIGH (ref 17–63)
ANION GAP: 6 (ref 5–15)
AST: 268 U/L — ABNORMAL HIGH (ref 15–41)
Alkaline Phosphatase: 839 U/L — ABNORMAL HIGH (ref 38–126)
BUN: 28 mg/dL — ABNORMAL HIGH (ref 6–20)
CALCIUM: 8.2 mg/dL — AB (ref 8.9–10.3)
CO2: 17 mmol/L — AB (ref 22–32)
Chloride: 114 mmol/L — ABNORMAL HIGH (ref 101–111)
Creatinine, Ser: 1.61 mg/dL — ABNORMAL HIGH (ref 0.61–1.24)
GFR calc non Af Amer: 46 mL/min — ABNORMAL LOW (ref >60)
GFR, EST AFRICAN AMERICAN: 53 mL/min — AB (ref >60)
GLUCOSE: 77 mg/dL (ref 65–99)
POTASSIUM: 4.5 mmol/L (ref 3.5–5.1)
SODIUM: 137 mmol/L (ref 135–145)
Total Bilirubin: 0.5 mg/dL (ref 0.3–1.2)
Total Protein: 5.3 g/dL — ABNORMAL LOW (ref 6.5–8.1)

## 2017-05-18 MED ORDER — HEPARIN SOD (PORK) LOCK FLUSH 100 UNIT/ML IV SOLN
500.0000 [IU] | Freq: Once | INTRAVENOUS | Status: AC
  Administered 2017-05-18: 500 [IU] via INTRAVENOUS
  Filled 2017-05-18: qty 5

## 2017-05-18 MED ORDER — POLYETHYLENE GLYCOL 3350 17 G PO PACK: 17.0000 g | PACK | Freq: Every day | ORAL | 0 refills | Status: AC | PRN

## 2017-05-18 MED ORDER — PREDNISONE 10 MG PO TABS: ORAL_TABLET | ORAL | 0 refills | Status: DC

## 2017-05-18 MED ORDER — BICALUTAMIDE 50 MG PO TABS: 50.0000 mg | ORAL_TABLET | Freq: Every day | ORAL | Status: AC

## 2017-05-18 MED ORDER — PANTOPRAZOLE SODIUM 40 MG PO TBEC: 40.0000 mg | DELAYED_RELEASE_TABLET | Freq: Every day | ORAL | 0 refills | Status: DC

## 2017-05-18 NOTE — Progress Notes (Signed)
Patient discharged to home with family, discharge instructions reviewed with pateint who verbalized understanding. New RX given to patient.

## 2017-05-20 LAB — CULTURE, BLOOD (ROUTINE X 2)
CULTURE: NO GROWTH
Culture: NO GROWTH
SPECIAL REQUESTS: ADEQUATE
Special Requests: ADEQUATE

## 2017-05-21 ENCOUNTER — Ambulatory Visit
Admission: RE | Admit: 2017-05-21 | Discharge: 2017-05-29 | Disposition: A | Discharge status: Home / Self Care | Payer: MEDICARE

## 2017-05-21 ENCOUNTER — Ambulatory Visit
Admission: RE | Admit: 2017-05-21 | Discharge: 2017-05-29 | Disposition: A | Discharge status: Home / Self Care | Payer: MEDICARE | Attending: Geriatric Medicine | Admitting: Geriatric Medicine

## 2017-05-21 ENCOUNTER — Ambulatory Visit
Admission: RE | Admit: 2017-05-21 | Discharge: 2017-05-29 | Disposition: A | Discharge status: Home / Self Care | Payer: MEDICARE | Attending: Hematology & Oncology | Admitting: Hematology & Oncology

## 2017-05-21 DIAGNOSIS — C3490 Malignant neoplasm of unspecified part of unspecified bronchus or lung: Secondary | ICD-10-CM

## 2017-05-21 DIAGNOSIS — C61 Malignant neoplasm of prostate: Secondary | ICD-10-CM

## 2017-05-21 DIAGNOSIS — G893 Neoplasm related pain (acute) (chronic): Principal | ICD-10-CM

## 2017-05-21 DIAGNOSIS — F1120 Opioid dependence, uncomplicated: Secondary | ICD-10-CM

## 2017-05-21 DIAGNOSIS — C7951 Secondary malignant neoplasm of bone: Secondary | ICD-10-CM

## 2017-05-21 DIAGNOSIS — Z1159 Encounter for screening for other viral diseases: Secondary | ICD-10-CM

## 2017-05-21 DIAGNOSIS — Z515 Encounter for palliative care: Secondary | ICD-10-CM

## 2017-05-21 DIAGNOSIS — R945 Abnormal results of liver function studies: Secondary | ICD-10-CM

## 2017-05-21 DIAGNOSIS — R748 Abnormal levels of other serum enzymes: Secondary | ICD-10-CM

## 2017-05-21 LAB — CBC W/ AUTO DIFF
BASOPHILS ABSOLUTE COUNT: 0 10*9/L (ref 0.0–0.1)
EOSINOPHILS ABSOLUTE COUNT: 0.2 10*9/L (ref 0.0–0.4)
HEMOGLOBIN: 11.4 g/dL — ABNORMAL LOW (ref 13.5–17.5)
LARGE UNSTAINED CELLS: 2 % (ref 0–4)
LYMPHOCYTES ABSOLUTE COUNT: 0.8 10*9/L — ABNORMAL LOW (ref 1.5–5.0)
MEAN CORPUSCULAR HEMOGLOBIN CONC: 32.7 g/dL (ref 31.0–37.0)
MEAN CORPUSCULAR HEMOGLOBIN: 33.6 pg (ref 26.0–34.0)
MEAN CORPUSCULAR VOLUME: 102.8 fL — ABNORMAL HIGH (ref 80.0–100.0)
MEAN PLATELET VOLUME: 8.8 fL (ref 7.0–10.0)
MONOCYTES ABSOLUTE COUNT: 0.3 10*9/L (ref 0.2–0.8)
NEUTROPHILS ABSOLUTE COUNT: 7.6 10*9/L — ABNORMAL HIGH (ref 2.0–7.5)
PLATELET COUNT: 262 10*9/L (ref 150–440)
RED BLOOD CELL COUNT: 3.4 10*12/L — ABNORMAL LOW (ref 4.50–5.90)
RED CELL DISTRIBUTION WIDTH: 20.9 % — ABNORMAL HIGH (ref 12.0–15.0)

## 2017-05-21 LAB — SMEAR REVIEW

## 2017-05-21 LAB — COMPREHENSIVE METABOLIC PANEL
ALBUMIN: 3.5 g/dL (ref 3.5–5.0)
ALKALINE PHOSPHATASE: 815 U/L — ABNORMAL HIGH (ref 38–126)
ALT (SGPT): 263 U/L — ABNORMAL HIGH (ref 19–72)
ANION GAP: 14 mmol/L (ref 9–15)
AST (SGOT): 289 U/L — ABNORMAL HIGH (ref 19–55)
BILIRUBIN TOTAL: 0.7 mg/dL (ref 0.0–1.2)
BLOOD UREA NITROGEN: 33 mg/dL — ABNORMAL HIGH (ref 7–21)
BUN / CREAT RATIO: 20
CALCIUM: 8.8 mg/dL (ref 8.5–10.2)
CO2: 18 mmol/L — ABNORMAL LOW (ref 22.0–30.0)
CREATININE: 1.69 mg/dL — ABNORMAL HIGH (ref 0.70–1.30)
EGFR MDRD AF AMER: 51 mL/min/{1.73_m2} — ABNORMAL LOW (ref >=60)
EGFR MDRD NON AF AMER: 42 mL/min/{1.73_m2} — ABNORMAL LOW (ref >=60)
GLUCOSE RANDOM: 107 mg/dL (ref 65–179)
POTASSIUM: 4.4 mmol/L (ref 3.5–5.0)
PROTEIN TOTAL: 7.1 g/dL (ref 6.5–8.3)
SODIUM: 140 mmol/L (ref 135–145)

## 2017-05-21 LAB — PROSTATE SPECIFIC ANTIGEN: Prostate specific Ag:MCnc:Pt:Ser/Plas:Qn:: 60.5 — ABNORMAL HIGH

## 2017-05-21 LAB — AMPHETAMINE SCREEN URINE: Lab: <500

## 2017-05-21 LAB — TOXICOLOGY SCREEN, URINE
AMPHETAMINE SCREEN URINE: <500
BARBITURATE SCREEN URINE: <200
BENZODIAZEPINE SCREEN, URINE: <200
CANNABINOID SCREEN URINE: <20
COCAINE(METAB.)SCREEN, URINE: <150
OPIATE SCREEN URINE: <300

## 2017-05-21 LAB — ALT (SGPT): Alanine aminotransferase:CCnc:Pt:Ser/Plas:Qn:: 263 — ABNORMAL HIGH

## 2017-05-21 LAB — BASOPHILS ABSOLUTE COUNT: Lab: 0

## 2017-05-21 MED ORDER — OXYCODONE 5 MG TABLET
ORAL_TABLET | ORAL | 0 refills | 0 days | Status: CP | PRN
Start: 2017-05-21 — End: 2017-05-29

## 2017-05-21 MED ORDER — OXYCODONE ER 10 MG TABLET,CRUSH RESISTANT,EXTENDED RELEASE 12 HR
ORAL_TABLET | Freq: Every morning | ORAL | 0 refills | 0 days | Status: CP
Start: 2017-05-21 — End: 2017-05-22

## 2017-05-21 NOTE — Unmapped (Addendum)
OUTPATIENT ONCOLOGY PALLIATIVE CARE    Principal Diagnosis: Bruce Parker is a 56 y/o M with unfortunately two known metastatic cancers including metastatic lung cancer dx 2/16 on nivolumab and metastatic prostate cancer dx 12/16 with known bony dx and T10 pathologic fx castrate resistant now on Radium 223 and enzalutamide who is referred to Palliative Care Clinic for help with ongoing pain/symptom management and GOC discussion. Recently his PSA has increased from 50.7 to 64.2 and prostate cancer physician believes he has prognosis of 1 month to 1 year but continuing radium 223 infusion, enzalutamide and lupron for now.       Assessment/Plan:   1. Symptom Management    A. Malignant pain: This is his main symptom currently and is not well controlled on his current regimen. The pain seems to be bony pain most likely due to his prostate cancer. We discussed with patient our plan to take over his pain regimen from his oncologist and start low dose long-acting pain medication to better control his pain with less pain flares.    -start oxycontin 10 mg po every am, #15 tabs   -STOP percocet given his elevated LFTs, will order oxycodone 5 mg Q4 hrs PRN,  #75 tabs   -will have our RN call in ~10 days to see how he is doing with the regimen, if tolerating will send another 2 week supply and then re-evaluate at his f/u appt in 4 weeks from now   -continue current bowel regimen, instructed him to watch for worsening constipation on new regimen   -reviewed pain contract with patient, will also get Utox today     B. Dyspnea: in setting of decondition and his lung cancer. Currently stable on 3L O2 24/7. No cough or congestion today   -continue O2 supplementation   -continue narcotics as above, these may help his dyspnea as well as pain   -continue to monitor    2. Goals of Care/Advanced Care Planning  Discussed with Bruce Parker out role in support him during this journey. He expressed sadness about his prognosis but also hope that he actually has may years left. His goals right now are to live as long as he can, but also enjoy the little things like visiting with family, sitting in the outdoors and staying independent as much as he can. He would like his sister, [name], to be his HCPOA but has not formally filled out the paperwork yet. He does not want to be a burden on his family and if he becomes dependent for his ADLs he would like to go to a nursing home as he does not want them to have to take care of him. When he dies he would like to go peacefully in his sleep if possible. He has a strong faith and believes Jesus gives him only what he can handle and knows when it is his time. Does not have any formal Ads in place.    -continue to discuss GOC at each visit, today we mostly explored his current feelings and supported his hopes for the future. However I do worry his time is shorter than he would like and will discuss further GOC as we get to know him better   -will continue to encourage him to complete formal Ads, would like sister [name] to be HCPOA    # Controlled substances risk management.   - Patient has a signed pain medication agreement with Outpatient Palliative Care, completed on 05/21/17, as per standard of care.   -  [place]CCS[place] database was reviewed today and it was appropriate.   - Urine drug screen was performed at this visit. Findings: are pending at this time.   - Patient has received information about safe storage and administration of medications.   - Patient has not received a prescription for narcan; not needed at this time    F/u: 4 weeks    ----------------------------------------  Referring Oncology Provider: Dr. Philomena Course (Prostate Cancer provider) and Dr. Sofie Hartigan (lung cancer provider)  PCP: Lonie Peak, [facility]      HPI:  Bruce Parker is a 56 y/o M with unfortunately two known metastatic cancers including metastatic lung cancer dx 2/16 on nivolumab and metastatic prostate cancer dx 12/16 with known bony dx and T10 pathologic fx castrate resistant now on Radium 223 and enzalutamide who is referred to Palliative Care Clinic for help with ongoing pain/symptom management and GOC discussion. Recently his PSA has increased from 50.7 to 64.2 and prostate cancer physician believes he has prognosis of 1 month to 1 year but continuing radium 223 infusion, enzalutamide and lupron    Current cancer-directed therapy: Currently receiving Q2 week nivolumab (s/p cycle #48) , radium 223 infusions, lupron and enzalutamide    Bruce Parker is seen today with his brother-in-lay, Darryl. They both give the history. Overall they report that things have been pretty hard lately dealing with the 2 cancers. They were surprized at the last visit when they were told his prognosis is months to 1 year as they weren't expecting his time to be so short. However Bruce Parker is hopeful that they made a mistake and he has many more years to live because recently he has been feeling slightly better with improvement in his pain a little bit since starting the Radium 223 infusions. He reports his pain is a tired, deep achy feeling in his lower back/hips that goes down into both of his legs. It is worse with walking and certain positions but better with leanign to the right and taking his Percocet pills. The pain is constant and varied from 8/10-9.5/10, currently 8/10. He says he takes 1-2 percocet every 2 hours on average, thinks he takes about 5 pills total per day. In the past he has tried Tylenol #3, ibuprofen but these have not helped. He is worried about the medications making him sleepy if he increases the dose too much.    Symptom Review:  General: overall doing well    Pain: see above    Fatigue: does report if he walks too much he is in bed the next 2 days due to pain, has less energy in general    Sleep: occasionally wakes up with pain but in general sleeps well    Appetite: good, has recently gained some weight    Nausea: denies    Bowel function: reports daily BMs Dyspnea: on 3L O2 at baseline, able to walk 10-15 ft then becomes SOB, no cough, no SOB with lying flat    Secretions: denies    Mood: denies depression or low mood    Palliative Performance Scale: 60% - Ambulation: Reduced / unable to do hobby or some housework, significant disease / Self-Care:Occasional assist as necessary / Intake:Normal or reduced / Level of Conscious: Full or confusion    Coping/Support Issues:   Denies any support or coping issues. Has strong family, friend and church support    Goals of Care:   Bruce Parker reports that his main goal is to live as long as he can.  He would also like to live to next summer to enjoy the simple things in life including the fresh air, watching cars go by and taking care of himself. If he were to become to sick to take care of himself he would like to go to a nursing home so he would not be a burden for his family. If his time comes he would like to go in his sleep. He does not want surgery but otherwise is ok to continue aggressive treatments at this time, no formal Ads completed or in place    Social History:   Bruce Parker lives in [place] alone. However his sister [name] and [place] Darryl are actively involved in his care and check on him every day. His sister does most of his medications and his BIL takes him to all of his appointments. He uses a walker to get around, able to do his own ADLs but gets help with cooking, laundry and does not drive. He uses 3L O2 24/7. He used to [employer], also worked as a Nurse, adult. Denies EtOH, previous smoker but quit, denies drugs.    Advance Care Planning:   Does not have any formal ADs in place. Would like his sister, [name], to be his formal health care decision maker but has not completed the paperwork for his.   HCPOA: none  Natural surrogate decision maker: sister  Living Will: none  ACP note: none    Objective       Oncology History    --In 04/2014, PSA elevated to 56.4. DRE normal.  Prostate bx scheduled, but canceled by pt.  --In 09/2014, diagnosed with nonsmall cell lung cancer, stage IV (T2b, N2, M1b) with a large R hilar mass, mediastinal adenopathy, mets to bones. No uptake in liver or pancreas on PET scan. Insufficient material for molecular testing.  Guardant360 for ctDNA was negative.  Pt was subsequently treated with carboplatin/Alimta with initial good response.  Then disease progression.  On second line therapy with nivolumab.  --In 01/2015, PSA 62.2.  Seen in Urology clinic and referred to [facility] for possibility of prostate cancer.  After discussions about various options, underwent bone bx in 05/2015, nondiagnostic. PSA down to 23.7  --In 07/2015, prostate bx showed Gleason 4+5=9 adenocarcinoma.  --In 08/2015, androgen deprivation therapy with Lupron started.  PSA 354.  --In 11/2015, Lupron continued. PSA down to 1.7, which was the nadir. Subsequently, PSA began rising.  --In 06/2016, PSA up to 32.  Enzalutamide started.  MRI of brain neg. Bone scan stable.  --In 02/2017, PSA rise on enzalutamide. Bone scan stable. Radium 223 added        Lung cancer, primary, with metastasis from lung to other site (CMS-HCC) (Resolved)    10/11/2014 Initial Diagnosis     Lung cancer, primary, with metastasis from lung to other site [facility]           Prostate cancer (CMS-HCC)    07/27/2015 Initial Diagnosis     Prostate cancer (RAF-HCC)            Patient Active Problem List   Diagnosis   ??? Derangement of right patella   ??? Hypertension, benign   ??? Osteoarthrosis   ??? Chronic renal disease, stage 3, moderately decreased glomerular filtration rate (GFR) between 30-59 mL/min/1.73 square meter (CMS-HCC)   ??? Lichen planus   ??? Weakness   ??? Lung mass   ??? Mediastinal lymphadenopathy   ??? Malignant neoplasm metastatic to bone (CMS-HCC)   ???  Pain of metastatic malignancy   ??? Non-small cell carcinoma of lung (CMS-HCC)   ??? Protein-calorie malnutrition (CMS-HCC)   ??? Acute respiratory failure (CMS-HCC)   ??? Shortness of breath   ??? Tobacco dependence syndrome   ??? Prostate cancer (CMS-HCC)       Past Medical History:   Diagnosis Date   ??? Lung cancer (CMS-HCC)    ??? On supplemental oxygen therapy        Past Surgical History:   Procedure Laterality Date   ??? APPENDECTOMY     ??? PR BRNCHSC EBUS GUIDED SAMPL 3/> NODE STATION/STRUX N/A 05/18/2015    Procedure: Bronch, Rigid Or Flexible, Including Fluoro Guidance, When Performed; W Ebus Guided Transtracheal And/Or Transbronchial Sampling, 3 Or More Mediastinal And/Or Hilar Lymph Node Stations Or Structures;  Surgeon: Mathis Bud, MD;  Location: MAIN [facility];  Service: Pulmonary   ??? small intestines removed per pt         Current Outpatient Prescriptions   Medication Sig Dispense Refill   ??? albuterol 2.5 mg /3 mL (0.083 %) nebulizer solution Inhale 2.5 mg by nebulization every four (4) hours as needed.      ??? AMITIZA 24 mcg capsule Take 24 mcg by mouth daily with breakfast.      ??? betamethasone dipropionate (DIPROLENE) 0.05 % ointment Apply twice daily to affected areas as needed for itching 45 g 2   ??? diphenhydrAMINE (BENADRYL) 25 mg tablet Take 25 mg by mouth every six (6) hours as needed.      ??? doxycycline (VIBRA-TABS) 100 MG tablet Take 1 tablet (100 mg total) by mouth daily. 30 tablet 0   ??? doxycycline (VIBRA-TABS) 100 MG tablet TAKE 1 TABLET BY MOUTH ONCE DAILY 90 tablet 0   ??? doxycycline (VIBRA-TABS) 100 MG tablet TAKE 1 TABLET BY MOUTH ONCE DAILY 90 tablet 0   ??? enzalutamide (XTANDI) 40 mg cap capsule Take 4 capsules (160 mg total) by mouth daily. 120 capsule 11   ??? fluocinolone 0.01 % external oil Bid to back 120 mL 2   ??? folic acid (FOLVITE) 1 MG tablet Take 1 mg by mouth.     ??? food supplemt, lactose-reduced (ENSURE PLUS) 0.05-1.5 gram-kcal/mL liquid Take 237 mL by mouth daily.      ??? furosemide (LASIX) 20 MG tablet Take 20 mg by mouth.     ??? lactose-reduced food (ENSURE ENLIVE ORAL) Take 237 mL by mouth.     ??? levocetirizine (XYZAL) 5 MG tablet Take 5 mg by mouth.     ??? lidocaine-prilocaine (EMLA) cream Place on port site the morning of chemo     ??? magnesium oxide (MAG-OX) 400 mg tablet Take 400 mg by mouth.     ??? metoprolol tartrate (LOPRESSOR) 25 MG tablet Take 12.5 mg by mouth.     ??? mirtazapine (REMERON) 15 MG tablet Take 15 mg by mouth nightly.      ??? oxyCODONE-acetaminophen (PERCOCET) 5-325 mg per tablet Take 1 tablet by mouth every four (4) hours as needed for pain. 100 tablet 0   ??? polyethylene glycol (MIRALAX) 17 gram packet Take 17 g by mouth daily.      ??? prochlorperazine (COMPAZINE) 10 MG tablet Take 10 mg by mouth every eight (8) hours as needed.      ??? senna-docusate (PERICOLACE) 8.6-50 mg Take 1 tablet by mouth.     ??? sodium bicarbonate 650 mg tablet Take 650 mg by mouth.     ??? SUMAtriptan (IMITREX) 25  MG tablet      ??? tamsulosin (FLOMAX) 0.4 mg capsule Take 1 capsule (0.4 mg total) by mouth daily. 30 capsule 11   ??? triamcinolone (KENALOG) 0.1 % ointment Apply to arms twice a day until clear, then stop. (Patient taking differently: Apply 1 application topically two (2) times a day as needed. ) 30 g 1   ??? valACYclovir (VALTREX) 1000 MG tablet TAKE ONE TABLET BY MOUTH ONCE DAILY 90 tablet 0   ??? valACYclovir (VALTREX) 1000 MG tablet Take 1 tablet (1,000 mg total) by mouth daily. 30 tablet 0     No current facility-administered medications for this visit.        Allergies: No Known Allergies    Family History:  Cancer-related family history includes Cancer in his brother. There is no history of Melanoma.  indicated that the status of his mother is unknown. He indicated that the status of his brother is unknown. He indicated that the status of his neg hx is unknown.       REVIEW OF SYSTEMS:  A comprehensive review of 14 systems was negative except for pertinent positives noted in HPI.      PHYSICAL EXAM:   Vital signs for this encounter: VS reviewed in EPIC.  GEN: Awake and alert, pleasant appearing male in no acute distress, in wheelchair with 3L O2 on. Able to get up to exam chair but moves slowly and needs some assistance.  PSYCH: Alert and oriented to person, place and time. Euthymic.  HEENT: Pupils equally round without scleral icterus. No facial asymmetry.  CV: Regular rhythm with normal rate, no murmurs.  LUNGS: No increased work of breathing. CTAB but taking slightly shallow breaths  ABD: Soft and non-tender, normoactive bs, no hepatosplenomegacly  SKIN: No rashes, petechiae or jaundice noted  EXT: No edema noted of the lower extremities  NEURO: Normal gait and coordination.    Lab Results   Component Value Date    CREATININE 1.69 (H) 05/21/2017     Lab Results   Component Value Date    ALKPHOS 815 (H) 05/21/2017    BILITOT 0.7 05/21/2017    PROT 7.1 05/21/2017    ALBUMIN 3.5 05/21/2017    ALT 263 (H) 05/21/2017    AST 289 (H) 05/21/2017        I personally spent over half of a total 60. minutes in counseling and discussion with the patient as described above.    Franchot Mimes, MD  Outpatient Oncology Palliative Care HPM fellow    I saw and evaluated the patient, participating in the key portions of the service.  I reviewed the palliative medicine fellow???s note.  I agree with the fellow???s findings and plan. Hilda Blades, MD

## 2017-05-21 NOTE — Unmapped (Addendum)
PA approval for Oxycontin 10 mg QD  Ref # [id number]    BIN [id number]  PCN [id number]  RxGroup [id number]  Card ID: [id number]    Good thru Dec 2017-Dec 2019  Approval # [id number]    Notified pt and pharmacy

## 2017-05-21 NOTE — Unmapped (Addendum)
Lab Results   Component Value Date    PSA 64.20 (H) 04/23/2017    PSA 50.70 (H) 03/26/2017    PSA 67.60 (H) 02/21/2017    PSA 60.80 (H) 01/10/2017    PSA 28.10 (H) 10/11/2016    PSA 24.90 (H) 08/30/2016   Radium treatment today.  #4 out of 6 planned.    Please call [phone] to reach my nurse navigator Mauricia Area for any issues.    For emergencies on Nights, Weekends and Holidays  Call [phone] and ask for the hematology/oncology on call.    Griffin Basil, MD, PhD  Associate Professor of Medicine  Division of Hematology-Oncology    [facility]  Genitourinary Oncology Clinic  Nurse Navigator: Mauricia Area  Fax: [phone]

## 2017-05-21 NOTE — Unmapped (Signed)
Lab drawn and sent for analysis.

## 2017-05-21 NOTE — Unmapped (Signed)
[facility] Visit Note    Patient Name: Bruce Parker  Patient Age: 56 y.o.  Encounter Date: 05/21/2017  Provider: Maurie Boettcher  Referring physician: Maurie Boettcher, MD    Assessment  Patient Active Problem List   Diagnosis   ??? Derangement of right patella   ??? Hypertension, benign   ??? Osteoarthrosis   ??? Chronic renal disease, stage 3, moderately decreased glomerular filtration rate (GFR) between 30-59 mL/min/1.73 square meter (CMS-HCC)   ??? Lichen planus   ??? Weakness   ??? Lung mass   ??? Mediastinal lymphadenopathy   ??? Malignant neoplasm metastatic to bone (CMS-HCC)   ??? Pain of metastatic malignancy   ??? Non-small cell carcinoma of lung (CMS-HCC)   ??? Protein-calorie malnutrition (CMS-HCC)   ??? Acute respiratory failure (CMS-HCC)   ??? Shortness of breath   ??? Tobacco dependence syndrome   ??? Prostate cancer (CMS-HCC)     1. Metastatic castration resistant prostate cancer, with bone metastases  2. Stage IV nonsmall cell lung cancer, on secondline therapy with nivolumab s/p cycle #48 (q2w)    On firstline CRPC therapy with enzalutamide. PSA has increased from 50.7 to 64.2 since his last visit. We had a frank discussion regarding expected prognosis moving forward in on the order of months to one year, but for now will continue with radium treatment for total of 6 doses, enzalutamide, and lupron.      Today, he will proceed with Radium 223 infusion #4.  Pain may be improving, perhaps due to Radium 223 infusion.    Plan  1. Proceed with Radium-223 infusion #4 of 6 planned  2. Continue enzalutamide dose to 160 mg qd (regular dose), since he is tolerating well.  Follow PSA  3. Lupron 22.5 mg IM, given today, 04/23/17, next due 07/23/17 or later  4. Seeing palliative care today.  I discussed his case with them.  5. Continue with stage 4 lung cancer treatment by his local oncologist Dr. Arbutus Ped.  6. Elevated LFT's being managed by Dr. Arbutus Ped.  In reviewing CareEverywhere, pt was screened for hepatitis.  Nivo being held.  I would also consider imaging the liver, unless the LFT's normalize.  7. Return in 4 weeks for next infusion of Radium 223      Reason for Visit  Prostate cancer    History of Present Illness:  Oncology History    --In 04/2014, PSA elevated to 56.4. DRE normal.  Prostate bx scheduled, but canceled by pt.  --In 09/2014, diagnosed with nonsmall cell lung cancer, stage IV (T2b, N2, M1b) with a large R hilar mass, mediastinal adenopathy, mets to bones. No uptake in liver or pancreas on PET scan. Insufficient material for molecular testing.  Guardant360 for ctDNA was negative.  Pt was subsequently treated with carboplatin/Alimta with initial good response.  Then disease progression.  On second line therapy with nivolumab.  --In 01/2015, PSA 62.2.  Seen in Urology clinic and referred to [facility] for possibility of prostate cancer.  After discussions about various options, underwent bone bx in 05/2015, nondiagnostic. PSA down to 23.7  --In 07/2015, prostate bx showed Gleason 4+5=9 adenocarcinoma.  --In 08/2015, androgen deprivation therapy with Lupron started.  PSA 354.  --In 11/2015, Lupron continued. PSA down to 1.7, which was the nadir. Subsequently, PSA began rising.  --In 06/2016, PSA up to 32.  Enzalutamide started.  MRI of brain neg. Bone scan stable.  --In 02/2017, PSA rise on enzalutamide. Bone scan stable. Radium  223 added        Lung cancer, primary, with metastasis from lung to other site (CMS-HCC) (Resolved)    10/11/2014 Initial Diagnosis     Lung cancer, primary, with metastasis from lung to other site [facility])           Prostate cancer (CMS-HCC)    07/27/2015 Initial Diagnosis     Prostate cancer (RAF-HCC)          The patient returns for scheduled follow up.  Pt notes that after infusion of radium 223 was started, his pain has generally improved and he's happy about that.  He does have persistent pain and he is looking forward to seeing palliative care today.  He notes that at his follow up with the oncologist overseeing his lung cancer treatment, his liver function tests were elevated and this is attributed to his nivolumab treatment.  It's held now.    Allergies:  No Known Allergies    Current Medications:    Current Outpatient Prescriptions:   ???  albuterol 2.5 mg /3 mL (0.083 %) nebulizer solution, Inhale 2.5 mg by nebulization every four (4) hours as needed. , Disp: , Rfl:   ???  AMITIZA 24 mcg capsule, Take 24 mcg by mouth daily with breakfast. , Disp: , Rfl:   ???  betamethasone dipropionate (DIPROLENE) 0.05 % ointment, Apply twice daily to affected areas as needed for itching, Disp: 45 g, Rfl: 2  ???  bicalutamide (CASODEX) 50 MG tablet, Take 50 mg by mouth., Disp: , Rfl:   ???  diphenhydrAMINE (BENADRYL) 25 mg tablet, Take 25 mg by mouth every six (6) hours as needed. , Disp: , Rfl:   ???  doxycycline (VIBRA-TABS) 100 MG tablet, Take 1 tablet (100 mg total) by mouth daily., Disp: 30 tablet, Rfl: 0  ???  doxycycline (VIBRA-TABS) 100 MG tablet, TAKE 1 TABLET BY MOUTH ONCE DAILY, Disp: 90 tablet, Rfl: 0  ???  doxycycline (VIBRA-TABS) 100 MG tablet, TAKE 1 TABLET BY MOUTH ONCE DAILY, Disp: 90 tablet, Rfl: 0  ???  enzalutamide (XTANDI) 40 mg cap capsule, Take 4 capsules (160 mg total) by mouth daily., Disp: 120 capsule, Rfl: 11  ???  fluocinolone 0.01 % external oil, Bid to back, Disp: 120 mL, Rfl: 2  ???  folic acid (FOLVITE) 1 MG tablet, Take 1 mg by mouth., Disp: , Rfl:   ???  food supplemt, lactose-reduced (ENSURE PLUS) 0.05-1.5 gram-kcal/mL liquid, Take 237 mL by mouth daily. , Disp: , Rfl:   ???  furosemide (LASIX) 20 MG tablet, Take 20 mg by mouth., Disp: , Rfl:   ???  lactose-reduced food (ENSURE ENLIVE ORAL), Take 237 mL by mouth., Disp: , Rfl:   ???  levocetirizine (XYZAL) 5 MG tablet, Take 5 mg by mouth., Disp: , Rfl:   ???  lidocaine-prilocaine (EMLA) cream, Place on port site the morning of chemo, Disp: , Rfl:   ???  magnesium oxide (MAG-OX) 400 mg tablet, Take 400 mg by mouth., Disp: , Rfl:   ???  metoprolol tartrate (LOPRESSOR) 25 MG tablet, Take 12.5 mg by mouth., Disp: , Rfl:   ???  mirtazapine (REMERON) 15 MG tablet, Take 15 mg by mouth nightly. , Disp: , Rfl:   ???  oxyCODONE-acetaminophen (PERCOCET) 5-325 mg per tablet, Take 1 tablet by mouth every four (4) hours as needed for pain., Disp: 100 tablet, Rfl: 0  ???  pantoprazole (PROTONIX) 40 MG tablet, Take 40 mg by mouth., Disp: , Rfl:   ???  polyethylene glycol (MIRALAX) 17 gram packet, Take 17 g by mouth daily. , Disp: , Rfl:   ???  predniSONE (DELTASONE) 10 MG tablet, , Disp: , Rfl:   ???  prochlorperazine (COMPAZINE) 10 MG tablet, Take 10 mg by mouth every eight (8) hours as needed. , Disp: , Rfl:   ???  senna-docusate (PERICOLACE) 8.6-50 mg, Take 1 tablet by mouth., Disp: , Rfl:   ???  sodium bicarbonate 650 mg tablet, Take 650 mg by mouth., Disp: , Rfl:   ???  SUMAtriptan (IMITREX) 25 MG tablet, , Disp: , Rfl:   ???  tamsulosin (FLOMAX) 0.4 mg capsule, Take 1 capsule (0.4 mg total) by mouth daily., Disp: 30 capsule, Rfl: 11  ???  triamcinolone (KENALOG) 0.1 % ointment, Apply to arms twice a day until clear, then stop. (Patient taking differently: Apply 1 application topically two (2) times a day as needed. ), Disp: 30 g, Rfl: 1  ???  valACYclovir (VALTREX) 1000 MG tablet, TAKE ONE TABLET BY MOUTH ONCE DAILY, Disp: 90 tablet, Rfl: 0  ???  valACYclovir (VALTREX) 1000 MG tablet, Take 1 tablet (1,000 mg total) by mouth daily., Disp: 30 tablet, Rfl: 0    Past Medical History and Social History  Past Medical History:   Diagnosis Date   ??? Lung cancer (CMS-HCC)    ??? On supplemental oxygen therapy       Past Surgical History:   Procedure Laterality Date   ??? APPENDECTOMY     ??? PR BRNCHSC EBUS GUIDED SAMPL 3/> NODE STATION/STRUX N/A 05/18/2015    Procedure: Bronch, Rigid Or Flexible, Including Fluoro Guidance, When Performed; W Ebus Guided Transtracheal And/Or Transbronchial Sampling, 3 Or More Mediastinal And/Or Hilar Lymph Node Stations Or Structures;  Surgeon: Mathis Bud, MD;  Location: MAIN OR [facility];  Service: Pulmonary   ??? small intestines removed per pt          Social History     Occupational History   ??? Not on file.     Social History Main Topics   ??? Smoking status: Former Smoker     Packs/day: 1.00     Years: 30.00     Types: Cigarettes     Quit date: 05/15/2014   ??? Smokeless tobacco: Never Used   ??? Alcohol use No   ??? Drug use: No   ??? Sexual activity: Not on file   Pt is accompanied by his ?brother-in-law, who usually brings him to clinic    Family History  Brother had lung cancer.    Review of Systems:  A comprehensive review of 10 systems was negative except for pertinent positives noted in HPI.    Physical Exam:    VITAL SIGNS:  BP 156/87  - Pulse 79  - Temp 36.6 ??C (97.9 ??F) (Oral)  - Resp 16  - Ht 175.3 cm (5' 9.02)  - Wt 79 kg (174 lb 1.6 oz)  - SpO2 95%  - BMI 25.70 kg/m??   ECOG Performance Status: 2  GENERAL: Chronically ill-appearing, difficulty sitting up straight  HEAD: Normocephalic and atraumatic.  EYES: Conjunctivae are normal. No scleral icterus.  MOUTH/THROAT: Oropharynx is clear and moist.  No mucosal lesions.  NECK: Supple, no thyromegaly.  LYMPHATICS: No palpable cervical, supraclavicular, or axillary adenopathy.  CARDIOVASCULAR: Normal rate, regular rhythm and normal heart sounds.  Exam reveals no gallop and no friction rub.  No murmur heard.  PULMONARY/CHEST: Posterior inferior bibasilar crackles  ABDOMINAL:  Soft. There is no distension. There is  no tenderness. There is no rebound and no guarding.  MUSCULOSKELETAL: No clubbing, cyanosis, or lower extremity edema.  TTP of thoracic spine.  PSYCHIATRIC: Alert and oriented.  Normal mood and affect.  NEUROLOGIC: No focal motor deficit. Gait not observed.  SKIN: Skin is warm, dry, and intact.      Results/Orders:    Office Visit on 05/21/2017   Component Date Value Ref Range Status   ??? Sodium 05/21/2017 140  135 - 145 mmol/L Final   ??? Potassium 05/21/2017 4.4  3.5 - 5.0 mmol/L Final   ??? Chloride 05/21/2017 108* 98 - 107 mmol/L Final ??? CO2 05/21/2017 18.0* 22.0 - 30.0 mmol/L Final   ??? BUN 05/21/2017 33* 7 - 21 mg/dL Final   ??? Creatinine 05/21/2017 1.69* 0.70 - 1.30 mg/dL Final   ??? BUN/Creatinine Ratio 05/21/2017 20   Final   ??? EGFR MDRD Non Af Amer 05/21/2017 42* >=60 mL/min/1.69m2 Final   ??? EGFR MDRD Af Amer 05/21/2017 51* >=60 mL/min/1.35m2 Final   ??? Anion Gap 05/21/2017 14  9 - 15 mmol/L Final   ??? Glucose 05/21/2017 107  65 - 179 mg/dL Final   ??? Calcium 16/05/9603 8.8  8.5 - 10.2 mg/dL Final   ??? Albumin 54/04/8118 3.5  3.5 - 5.0 g/dL Final   ??? Total Protein 05/21/2017 7.1  6.5 - 8.3 g/dL Final   ??? Total Bilirubin 05/21/2017 0.7  0.0 - 1.2 mg/dL Final   ??? AST 14/78/2956 289* 19 - 55 U/L Final   ??? ALT 05/21/2017 263* 19 - 72 U/L Final   ??? Alkaline Phosphatase 05/21/2017 815* 38 - 126 U/L Final   ??? WBC 05/21/2017 9.0  4.5 - 11.0 10*9/L Final   ??? RBC 05/21/2017 3.40* 4.50 - 5.90 10*12/L Final   ??? HGB 05/21/2017 11.4* 13.5 - 17.5 g/dL Final   ??? HCT 21/30/8657 34.9* 41.0 - 53.0 % Final   ??? MCV 05/21/2017 102.8* 80.0 - 100.0 fL Final   ??? MCH 05/21/2017 33.6  26.0 - 34.0 pg Final   ??? MCHC 05/21/2017 32.7  31.0 - 37.0 g/dL Final   ??? RDW 84/69/6295 20.9* 12.0 - 15.0 % Final   ??? MPV 05/21/2017 8.8  7.0 - 10.0 fL Final   ??? Platelet 05/21/2017 262  150 - 440 10*9/L Final   ??? Neutrophil Left Shift 05/21/2017 1+* Not Present Final   ??? Absolute Neutrophils 05/21/2017 7.6* 2.0 - 7.5 10*9/L Final   ??? Absolute Lymphocytes 05/21/2017 0.8* 1.5 - 5.0 10*9/L Final   ??? Absolute Monocytes 05/21/2017 0.3  0.2 - 0.8 10*9/L Final   ??? Absolute Eosinophils 05/21/2017 0.2  0.0 - 0.4 10*9/L Final   ??? Absolute Basophils 05/21/2017 0.0  0.0 - 0.1 10*9/L Final   ??? Large Unstained Cells 05/21/2017 2  0 - 4 % Final   ??? Microcytosis 05/21/2017 Slight* Not Present Final   ??? Macrocytosis 05/21/2017 Marked* Not Present Final   ??? Anisocytosis 05/21/2017 Moderate* Not Present Final       PSA   Date Value Ref Range Status   04/23/2017 64.20 (H) 0.00 - 4.00 ng/mL Final   03/26/2017 50.70 (H) 0.00 - 4.00 ng/mL Final   02/21/2017 67.60 (H) 0.00 - 4.00 ng/mL Final   01/10/2017 60.80 (H) 0.00 - 4.00 ng/mL Final   10/11/2016 28.10 (H) 0.00 - 4.00 ng/mL Final   08/30/2016 24.90 (H) 0.00 - 4.00 ng/mL Final   07/19/2016 32.60 (H) 0.00 - 4.00 ng/mL Final   06/21/2016 21.00 (H)  0.00 - 4.00 ng/mL Final   03/15/2016 2.37 0.00 - 4.00 ng/mL Final   12/08/2015 1.72 0.00 - 4.00 ng/mL Final   Baseline PSA prior to enzalutamide is 32.6 on 07/19/2016      Testosterone   Date Value Ref Range Status   06/21/2016 10 (L) 179 - 756 ng/dL Final   60/45/4098 119 (L) 179 - 756 ng/dL Final   14/78/2956 84 (L) 179 - 756 ng/dL Final              Orders placed or performed in visit on 05/21/17   ??? Opiate Confirmation, Urine   ??? Toxicology Screen, Urine     Pathology:  Final Diagnosis    ?? A:?? Prostate,?? Right base, needle biopsy ??  - Prostatic adenocarcinoma, Gleason score 4 + 3 = 7 (Grade group 3) involving 2 of 2 cores, approximately 12 and 12 mm in discontinuous linear extent, approximately 80% of total core length.    B:?? Prostate,?? Right mid, needle biopsy   - Prostatic adenocarcinoma, Gleason score 4 + 3 = 7 (Grade group 3) involving 2 of 2 cores, approximately 11 and 7 mm in discontinuous linear extent, approximately 70% of total core length.    C:?? Prostate,?? Right apex, needle biopsy   - Prostatic adenocarcinoma, Gleason score 3 + 5 = 8 (Grade group 4) involving 2 of 2 cores, approximately 9 and 14 mm in discontinuous linear extent, approximately 80% of total core length. ??  - Perineural invasion identified in this case.    D:?? Prostate,?? Left base, needle biopsy   - Prostatic adenocarcinoma, Gleason score 4 + 5 = 9 (Grade group 5) involving 2 of 2 cores, approximately 13 and 13 mm in discontinuous linear extent, approximately 90% of total core length.     E:?? Prostate,?? Left mid, needle biopsy   - Prostatic adenocarcinoma, Gleason score 4 + 4 = 8 (Grade group 4) involving 2 of 2 cores, approximately 5 and 15 mm in discontinuous linear extent, approximately 70% of total core length.     F:?? Prostate,?? Left apex, needle biopsy   - Prostatic adenocarcinoma, Gleason score 4 + 5 = 9 (Grade group 5) involving 2 of 2 cores, approximately 9 and 11 mm in linear extent, approximately 90% of total core length. ??  - Suspicious for extraprostatic extension         Imaging results:  PET scan in 10/15/2014  IMPRESSION:  Markedly hypermetabolic right paratracheal lesion, consistent with  the patient's known malignancy. This is associated with  hypermetabolic metastases in the right hilum and scattered sclerotic  hypermetabolic bone metastases.    The cystic lesion in the pancreatic head is not hypermetabolic on  PET imaging. As such, this finding may not be related to metastatic  disease. Attention on followup imaging is suggested.    Mild FDG uptake in both adrenal glands with asymmetric appearance  and no underlying nodule or mass. Uptake felt to be physiologic, but  attention to these areas on follow-up is also recommended.    No evidence for hypermetabolic disease in the liver on today's  Study.    MRI of brain 08/30/2016  No evidence of intracranial metastasis      Bone scan 08/30/2016  Diffuse osseous metastatic disease, predominantly throughout the axial skeleton.    Bone scan 02/07/2017  Impression    Diffuse osseous metastatic disease predominantly in the axial skeleton, similar to prior. The only obvious new focus is in the right posterior ninth rib  X ray of femur R 02/21/2017  Impression      1. Unchanged severe right hip osteoarthrosis likely secondary to femoral head collapse related to osteonecrosis.    2. Likely distal femoral bone infarct. Otherwise, no focal lytic or blastic bone lesion.        MRI T/L Spine (performed at [facility] 03/2017)  Diffuse osseous mets, possible T10 pathologic fracture without height loss. No canal stenosis. Mild L3-4, L4-5 neural foraminal narrowing

## 2017-05-21 NOTE — Discharge Summary (Signed)
[facility] Discharge Summary   Patient: Francisco Love HYQ:[id number]   PCP: Curt Bears, MD DOB: 07/18/1961   Date of admission: 05/15/2017   Date of discharge: 05/18/2017    Discharge Diagnoses:  Principal Problem:   Drug-induced hepatitis Active Problems:   Protein-calorie malnutrition ([place])   Non-small cell cancer of right lung (HCC)   Neoplasm related pain   Chronic fatigue   Metabolic acidosis, NAG, bicarbonate losses   CKD (chronic kidney disease) stage 4,   Elevated liver enzymes   Admitted From: home Disposition:  home  Recommendations for Outpatient Follow-up:  1. Please follow up with Oncology get repeat CMP.  Follow-up Information    Curt Bears, MD. Schedule an appointment as soon as possible for a visit in 1 week(s).   Specialty:  Oncology Contact information: [place] [id number] [phone]          Diet recommendation: regular diet  Activity: The patient is advised to gradually reintroduce usual activities.  Discharge Condition: good  Code Status: full code  History of present illness: As per the H and P dictated on admission, "Francisco Love is a 56 y.o. male with Past medical history of metastatic adenocarcinoma of the right lung with bone metastasis, chronic respiratory failure, prostate cancer, chronic kidney disease stage III. Patient presented with complaints of fatigue Patientdiagnosed with metastatic adenocarcinoma of the lungin 2016,recently started on immunotherapy with high-dose Nivolumab. Also recently had treatment with radium 223 for prostate cancer. He has been having fatigue, 2-3 loose watery daily diarrhea since that treatment. Is appetite has worsened recently in last few days.He was seen at cancer center for second cycle of his chemotherapy but due to his elevated LFTit was postponed for one week. When he presented there today his LFTs had worsened further and therefore he was referred to  ER. Patient denies having any complaints of nausea or vomiting, no abdominal pain, no active bleeding, no burning urination. He does have fever as well as chills. Although they did not check his temperature at home. No other recent change in medications reported."  Hospital Course:  Summary of his active problems in the hospital is as following. 1. Drug-induced hepatitis significant elevation of LFT. ALP 1000,AST and ALT in 300-500s. Significantly better today No abdominal pain. Discussed with GI, recommend getting ultrasound with Doppler, impression: Normal liver duplex examination. Portal vein is patent with normal direction of flow. hepatitis panel negative Normal INR, no other workup recommended. Discussed with on-call oncology, given the chronology of the events it appears drug-induced hepatitis from Nivolumab.  Treatment is 1 mg/kg prednisone equivalent dose, which will 75 mg prednisone, which will be 60 mg of IV solu-Medrol. Change to PO prednisone.   2. Chronic kidney disease. Renal function relatively stable. We'll monitor. Gentle IV hydration.  3. Cancer-related pain. Continue home regimen  4.chronic metabolic acidosis. Patient is on bicarbonate which I will continue.  5.protein calorie malnutrition. Monitor for now. dietary consult.  All other chronic medical condition were stable during the hospitalization.  Patient was seen by physical therapy, who recommended no PT follow up needed. On the day of the discharge the patient's vitals were stable, and no other acute medical condition were reported by patient. the patient was felt safe to be discharge at home with family.  Procedures and Results:  none   Consultations:  oncology   DISCHARGE MEDICATION: Discharge Medication List as of 05/18/2017  1:04 PM    START taking these medications   Details  pantoprazole (PROTONIX) 40 MG tablet Take 1 tablet (40 mg total) by mouth daily., Starting Sat 05/18/2017, Normal      polyethylene glycol (MIRALAX / GLYCOLAX) packet Take 17 g by mouth daily as needed for mild constipation., Starting Sat 05/18/2017, Normal    predniSONE (DELTASONE) 10 MG tablet Take 40mg  TWICE DAILY for 4 days,Take 30mg  TWICE daily for 4 days,Take 50mg  DAILY for 4 days,Take 40mg  DAILY for 4 days,Take 30mg  DAILY for 4 days,Take 20mg  DAILY for 4 days,Take 10mg  DAILY for 6 days, then stop., Print      CONTINUE these medications which have CHANGED   Details  bicalutamide (CASODEX) 50 MG tablet Take 1 tablet (50 mg total) by mouth daily. HOLD UNTIL SEEN BY ONCOLOGY., Starting Sat 05/18/2017, No Print      CONTINUE these medications which have NOT CHANGED   Details  albuterol (PROVENTIL) (2.5 MG/3ML) 0.083% nebulizer solution Take 3 mLs (2.5 mg total) by nebulization every 2 (two) hours as needed for wheezing., Starting Fri 09/24/2014, No Print    AMITIZA 24 MCG capsule Take 24 mcg by mouth 2 (two) times daily with a meal. , Starting Wed 04/06/2015, Historical Med    betamethasone dipropionate (DIPROLENE) 0.05 % ointment Apply twice daily to affected areas as needed for itching, Historical Med    folic acid (FOLVITE) 1 MG tablet Take 1 mg by mouth daily., Historical Med    magnesium oxide (MAG-OX) 400 MG tablet Take 400 mg by mouth daily., Historical Med    mirtazapine (REMERON) 15 MG tablet Take 1 tablet (15 mg total) by mouth at bedtime., Starting Wed 12/19/2016, No Print    oxyCODONE-acetaminophen (PERCOCET/ROXICET) 5-325 MG tablet Take 1 tablet by mouth every 6 (six) hours as needed for severe pain., Starting Wed 05/08/2017, Print    prochlorperazine (COMPAZINE) 10 MG tablet Take 1 tablet (10 mg total) by mouth every 6 (six) hours as needed for nausea or vomiting., Starting Fri 10/22/2014, Normal    senna-docusate (SENOKOT-S) 8.6-50 MG per tablet Take 1 tablet by mouth daily., Starting Tue 10/19/2014, No Print    sodium bicarbonate 650 MG tablet Take 1 tablet (650 mg total) by mouth daily.,  Starting Mon 12/24/2016, Normal    SUMAtriptan (IMITREX) 25 MG tablet Take 25 mg by mouth every 2 (two) hours as needed for migraine. , Starting Thu 05/31/2016, Historical Med    tamsulosin (FLOMAX) 0.4 MG CAPS capsule Take 0.4 mg by mouth daily. , Starting Tue 03/29/2015, Historical Med    valACYclovir (VALTREX) 500 MG tablet Take 500 mg by mouth 2 (two) times daily., Starting Fri 03/08/2017, Historical Med    XTANDI 40 MG capsule Take 160 mg by mouth every evening. , Starting Tue 07/24/2016, Historical Med    furosemide (LASIX) 20 MG tablet Take 1 tablet (20 mg total) by mouth daily as needed for fluid or edema., Starting Mon 12/24/2016, Normal    lidocaine-prilocaine (EMLA) cream Apply 1 application topically as needed. Apply to port site prior to chemotherapy., Starting Wed 11/07/2016, Normal      STOP taking these medications     metoprolol tartrate (LOPRESSOR) 25 MG tablet      ciprofloxacin (CIPRO) 500 MG tablet        No Known Allergies Discharge Instructions    Diet - low sodium heart healthy    Complete by:  As directed    Discharge instructions    Complete by:  As directed    It is important that you read following instructions as  well as go over your medication list with RN to help you understand your care after this hospitalization.  Discharge Instructions: Please follow-up with PCP in one week  Please request your primary care physician to go over all Hospital Tests and Procedure/Radiological results at the follow up,  Please get all Hospital records sent to your PCP by signing hospital release before you go home.   Do not take more than prescribed Pain, Sleep and Anxiety Medications. You were cared for by a hospitalist during your hospital stay. If you have any questions about your discharge medications or the care you received while you were in the hospital after you are discharged, you can call the unit and ask to speak with the hospitalist on call if the hospitalist  that took care of you is not available.  Once you are discharged, your primary care physician will handle any further medical issues. Please note that NO REFILLS for any discharge medications will be authorized once you are discharged, as it is imperative that you return to your primary care physician (or establish a relationship with a primary care physician if you do not have one) for your aftercare needs so that they can reassess your need for medications and monitor your lab values. You Must read complete instructions/literature along with all the possible adverse reactions/side effects for all the Medicines you take and that have been prescribed to you. Take any new Medicines after you have completely understood and accept all the possible adverse reactions/side effects. Wear Seat belts while driving. If you have smoked or chewed Tobacco in the last 2 yrs please stop smoking and/or stop any Recreational drug use.   Increase activity slowly    Complete by:  As directed      Discharge Exam: Filed Weights   05/18/17 0630  Weight: 76.5 kg (168 lb 10.4 oz)   Vitals:   05/17/17 2218 05/18/17 0630  BP: (!) 133/98 (!) 131/96  Pulse: 72 70  Resp: 20 20  Temp: 98.6 F (37 C) 97.7 F (36.5 C)  SpO2: 100% 100%   General: Appear in no distress, no Rash; Oral Mucosa moist. Cardiovascular: S1 and S2 Present, no Murmur, no JVD Respiratory: Bilateral Air entry present and Clear to Auscultation, no Crackles, no wheezes Abdomen: Bowel Sound present, Soft and no tenderness Extremities: no Pedal edema, no calf tenderness Neurology: Grossly no focal neuro deficit.  The results of significant diagnostics from this hospitalization (including imaging, microbiology, ancillary and laboratory) are listed below for reference.    Significant Diagnostic Studies: Ct Abdomen Pelvis Wo Contrast  Result Date: 05/15/2017 CLINICAL DATA:  Hypoxia. Chemotherapy treatment 3 weeks ago. Metastatic lung cancer.  Increased shortness of breath. Elevated creatinine. Vomiting and diarrhea. EXAM: CT ABDOMEN AND PELVIS WITHOUT CONTRAST TECHNIQUE: Multidetector CT imaging of the abdomen and pelvis was performed following the standard protocol without IV contrast. COMPARISON:  CT abdomen 03/27/2017 FINDINGS: Lower chest: Stable chronic bilateral pleural thickening. Centrilobular emphysema. Indistinctly marginated ground-glass density in the right middle lobe laterally, potentially from mild alveolitis or edema. Hepatobiliary: 0.9 cm cyst laterally in the lateral segment left hepatic lobe, no change from 09/20/2014. No significant liver lesion identified. Gallbladder mildly contracted. Pancreas: Faint hypodensity in the pancreatic head with adjacent calcifications, chronically stable, possibly related to chronic calcific pancreatitis, and not definitely changed from 09/20/2014. Spleen: Unremarkable Adrenals/Urinary Tract: Duplicated right collecting system. Atrophic left kidney. Adrenal glands unremarkable. Stomach/Bowel: Scattered air-fluid levels in borderline dilated loops of small bowel, no obvious transition  point. Vascular/Lymphatic: Aortoiliac atherosclerotic vascular disease. Chronically stable stranding around the proximal common iliac arteries, not changed from 2016. No pathologic adenopathy. Reproductive: Unremarkable Other: No supplemental non-categorized findings. Musculoskeletal: Extensive osseous metastatic disease with similar periostitis along the left iliac bone and a similar distribution of lesions. Markedly severe arthropathy of both hips. No new bony metastatic disease is identified. Direct left inguinal hernia containing adipose tissue noted. IMPRESSION: 1. There is some new faint ground-glass opacity anteriorly in the right middle lobe potentially from low-grade alveolitis. Underlying emphysema noted along with chronic pleural thickening. 2. Borderline dilated small bowel loops containing scattered air-fluid  levels, query diffuse small bowel ileus. I do not see a definite lead point for obstruction. 3. Stable extensive osseous metastatic disease, with similar periostitis along the left iliac bone. 4. Other imaging findings of potential clinical significance: Chronic calcific in cystic lesion in the pancreatic head. Duplicated right renal collecting system. Atrophic left kidney. Aortic Atherosclerosis (ICD10-I70.0). Chronic indistinct density along the aortic bifurcation and proximal common iliac vessels. Severe bilateral hip arthropathy. Direct left inguinal hernia containing adipose tissue. Electronically Signed   By: Van Clines M.D.   On: 05/15/2017 13:52   Dg Chest 2 View  Result Date: 05/15/2017 CLINICAL DATA:  Increase weakness and shortness of breath over the past several days. History of lung malignancy with most recent chemotherapy 3 weeks ago. EXAM: CHEST  2 VIEW COMPARISON:  Chest x-ray of Dec 24, 2016 FINDINGS: The lungs are well-expanded. There is minimal pleural thickening along the right lower lateral thoracic wall with similar findings on the left. This is stable. No pulmonary parenchymal nodules or masses are observed. There is no pleural effusion. The heart is top-normal in size. The pulmonary vascularity is normal. There is soft tissue fullness in the right paratracheal region which is stable and likely vascular. The power port catheter tip projects over the midportion of the SVC. IMPRESSION: Fairly stable appearance of the chest since the study of Dec 24, 2016. No evidence of pneumonia, CHF, or progressive malignancy. Electronically Signed   By: David  Martinique M.D.   On: 05/15/2017 10:49   US Liver Doppler  Result Date: 05/16/2017 CLINICAL DATA:  Elevated liver function tests. History of lung and prostate cancer. EXAM: DUPLEX ULTRASOUND OF LIVER TECHNIQUE: Color and duplex Doppler ultrasound was performed to evaluate the hepatic in-flow and out-flow vessels. COMPARISON:  Ultrasound  05/15/2017 and CT 05/15/2017 FINDINGS: Portal Vein Velocities Main:  23 cm/sec Right:  12 cm/sec Left:  13 cm/sec Hepatic Vein Velocities Right:  40 cm/sec Middle:  18 cm/sec Left:  29 cm/sec Hepatic Artery Velocity:  98 cm/sec Splenic Vein Velocity:  12 cm/sec Varices: None Ascites: None Portal vein is patent with normal direction of flow towards the liver. Main portal vein measures 1.0 cm in diameter. Normal direction of flow in the hepatic veins away from the liver. Spleen is not enlarged. Spleen measures 6.4 x 3.6 x 8.3 cm. IMPRESSION: Normal liver duplex examination. Portal vein is patent with normal direction of flow. Electronically Signed   By: Markus Daft M.D.   On: 05/16/2017 08:03   US Abdomen Limited Ruq  Result Date: 05/15/2017 CLINICAL DATA:  Elevated liver function tests. Short of breath. History of lung carcinoma and prostate carcinoma. EXAM: ULTRASOUND ABDOMEN LIMITED RIGHT UPPER QUADRANT COMPARISON:  CT, 03/27/2017 FINDINGS: Gallbladder: No gallstones or wall thickening visualized. No sonographic Murphy sign noted by sonographer. Common bile duct: Diameter: 7 mm Liver: Heterogeneous echogenicity. Normal in size with no  discrete mass or focal lesion. Slight prominence of the intrahepatic bile ducts. Portal vein is patent on color Doppler imaging with normal direction of blood flow towards the liver. IMPRESSION: 1. Normal gallbladder. Mild dilation of common bile duct and intrahepatic bile ducts. Patient has chronic pancreatitis findings on the prior CT. 2. Heterogeneous echotexture of the liver without a discrete mass or focal lesion. Given the history of elevated liver function tests, consider follow-up liver MRI with and without contrast for further assessment. Electronically Signed   By: Lajean Manes M.D.   On: 05/15/2017 18:07    Microbiology: Recent Results (from the past 240 hour(s))  Blood Culture (routine x 2)     Status: None   Collection Time: 05/15/17 10:13 AM  Result Value Ref  Range Status   Specimen Description BLOOD LEFT ANTECUBITAL  Final   Special Requests   Final    BOTTLES DRAWN AEROBIC AND ANAEROBIC Blood Culture adequate volume   Culture   Final    NO GROWTH 5 DAYS Performed at [facility], [address], [place], [place] [address]    Report Status 05/20/2017 FINAL  Final  Blood Culture (routine x 2)     Status: None   Collection Time: 05/15/17 10:24 AM  Result Value Ref Range Status   Specimen Description BLOOD RIGHT ANTECUBITAL  Final   Special Requests   Final    BOTTLES DRAWN AEROBIC AND ANAEROBIC Blood Culture adequate volume   Culture   Final    NO GROWTH 5 DAYS Performed at [facility], [place] [address] [place], [place] [address]    Report Status 05/20/2017 FINAL  Final     Labs: CBC:  Recent Labs Lab 05/15/17 0751 05/15/17 1023 05/16/17 0600 05/17/17 0500 05/18/17 0500  WBC 6.8 7.7 6.1 6.5 6.6  NEUTROABS 4.1 4.7 3.9  --   --   HGB 12.7* 12.0* 10.7* 9.4* 9.3*  HCT 38.4 34.9* 31.5* 27.3* 27.3*  MCV 98.2* 94.6 93.5 92.9 93.5  PLT 283 281 262 237 035   Basic Metabolic Panel:  Recent Labs Lab 05/15/17 0751 05/15/17 1023 05/16/17 0600 05/17/17 0500 05/18/17 0500  NA 143 139 138 138 137  K 4.8 4.3 5.1 4.3 4.5  CL  --  116* 115* 116* 114*  CO2 15* 14* 14* 16* 17*  GLUCOSE 117 108* 117* 81 77  BUN 40.0* 42* 37* 31* 28*  CREATININE 2.4* 2.25* 1.84* 1.62* 1.61*  CALCIUM 9.5 8.8* 8.6* 8.3* 8.2*  MG  --   --   --  1.8  --    Liver Function Tests:  Recent Labs Lab 05/15/17 0751 05/15/17 1023 05/16/17 0600 05/17/17 0500 05/18/17 0500  AST 645* 586* 482* 329* 268*  ALT 401* 339* 304* 226* 209*  ALKPHOS 1,139* 1,001* 969* 889* 839*  BILITOT 0.99 0.8 0.9 0.6 0.5  PROT 7.8 7.2 6.7 5.7* 5.3*  ALBUMIN 2.8* 2.9* 2.7* 2.3* 2.2*   Time spent: 35 minutes  Signed:  Rabia Argote  [facility] 05/18/2017  , 10:50 PM

## 2017-05-22 LAB — OPIATE, URINE, QUANTITATIVE
CODEINE GC/MS CONF: <50 ng/mL
HYDROCODONE GC/MS CONF: <50 ng/mL
HYDROMORPHONE GC/MS CONF: <50 ng/mL
MORPHINE GC/MS CONF: <50 ng/mL
NORBUPRENORPHINE: <5 ng/mL
OXYCODONE (GC/MS): <50 ng/mL

## 2017-05-22 LAB — CODEINE GC/MS CONF: Lab: <50

## 2017-05-22 MED ORDER — OXYCODONE ER 10 MG TABLET,CRUSH RESISTANT,EXTENDED RELEASE 12 HR
ORAL_TABLET | Freq: Every morning | ORAL | 0 refills | 0 days | Status: CP
Start: 2017-05-22 — End: 2017-05-29

## 2017-05-28 NOTE — Unmapped (Signed)
Received message that [name] was having more pain over the past few days and the pain medications are not helping his pain.    Spoke to [name] and his BIL Bruce Parker. They report that his pain has been much worse over the past 2 days than it has been before. The pain is located in his lower back and is constant and severe. He denies any trauma or falls to his back. He hasn't been able to get out of his chair due to the pain but does not feel like he has any worse weakness than normal, no new bowel or bladder incontinence. He has has 2 bowel movements in the past day. Taking po ok, no nausea or vomiting. He has been taking his oxycontin 10 mg in am and he says 5 mg oxycodone every 2-3 hours until today when pain increased so he took 20 mg oxycontin this morning and 3 tabs (15 mg) oxycodone without relief in his pain. He feels like the Percocet gave him more relief than the oxycodone.    A/P:  [name] endorses worsening lower back pain likely 2/2 disease progression/pathologic fracture. He does not sound constipated or that he is having new weakness but spinal cord compression also on ddx.   -plan to take 4 tabs oxycodone (20 mg) Q2-3 hours + 20 mg oxycontin tonight for pain control  -he can take PRN APAP as well but would limit to <2 g per day  -appointment tomorrow with team to reassess symptoms  -offered for him to take PRN ibuprofen as adjunct but he says this hasn't helped him ever  -patient would like to switch back to Percocet if possible, educated him on fact he is on same oxycodone dose but stopped the APAP component due to his LFTs. He knows this but still feels like it worked better and would like to consider going back on it even if there is some risk from the tylenol component  -discussed going to ED if his pain gets worse or if he has worsening weakness, numbness, bowel/bladder incontinence, fevers, or inability to take po    Please page with questions-  Franchot Mimes, MD  HPM Fellow  Pager [phone]

## 2017-05-29 ENCOUNTER — Ambulatory Visit
Admission: RE | Admit: 2017-05-29 | Discharge: 2017-05-29 | Disposition: A | Discharge status: Home / Self Care | Payer: MEDICARE | Attending: Pharmacist Clinician (PhC)/ Clinical Pharmacy Specialist | Admitting: Pharmacist Clinician (PhC)/ Clinical Pharmacy Specialist

## 2017-05-29 ENCOUNTER — Ambulatory Visit
Admission: RE | Admit: 2017-05-29 | Discharge: 2017-05-29 | Disposition: A | Discharge status: Home / Self Care | Payer: MEDICARE

## 2017-05-29 DIAGNOSIS — C3490 Malignant neoplasm of unspecified part of unspecified bronchus or lung: Principal | ICD-10-CM

## 2017-05-29 LAB — CREATININE
CREATININE: 2.12 mg/dL — ABNORMAL HIGH (ref 0.70–1.30)
EGFR MDRD NON AF AMER: 32 mL/min/{1.73_m2} — ABNORMAL LOW (ref >=60)

## 2017-05-29 LAB — ALKALINE PHOSPHATASE: Alkaline phosphatase:CCnc:Pt:Ser/Plas:Qn:: 408 — ABNORMAL HIGH

## 2017-05-29 LAB — BILIRUBIN, TOTAL: BILIRUBIN TOTAL: 0.7 mg/dL (ref 0.0–1.2)

## 2017-05-29 LAB — ALT (SGPT): Alanine aminotransferase:CCnc:Pt:Ser/Plas:Qn:: 193 — ABNORMAL HIGH

## 2017-05-29 LAB — EGFR MDRD NON AF AMER
Glomerular filtration rate/1.73 sq M.predicted.non black:ArVRat:Pt:Ser/Plas/Bld:Qn:Creatinine-based formula (MDRD): 32 — ABNORMAL LOW

## 2017-05-29 LAB — AST (SGOT): Aspartate aminotransferase:CCnc:Pt:Ser/Plas:Qn:: 135 — ABNORMAL HIGH

## 2017-05-29 LAB — BILIRUBIN TOTAL: Bilirubin:MCnc:Pt:Ser/Plas:Qn:: 0.7

## 2017-05-29 MED ORDER — OXYCODONE ER 20 MG TABLET,CRUSH RESISTANT,EXTENDED RELEASE 12 HR
ORAL_TABLET | Freq: Two times a day (BID) | ORAL | 0 refills | 0.00000 days | Status: CP
Start: 2017-05-29 — End: 2017-06-12

## 2017-05-29 MED ORDER — OXYCODONE-ACETAMINOPHEN 10 MG-325 MG TABLET
ORAL_TABLET | 0 refills | 0 days | Status: CP
Start: 2017-05-29 — End: 2017-06-12

## 2017-05-29 NOTE — Unmapped (Signed)
1. Change Oxycontin (the 12- hour medicine) to 20 mg twice daily. You have been switched to the 20 mg tablet - take 1 tablet twice daily  2. Stop using oxycodone 5 mg tablets  3. Start using percocet. Take 1 tablet (10 mg) every 4 hours as needed. For severe pain, you can take 2 tablets. DO NOT use more than 6 tablets per day. Using more can be harmful to your liver.     Here is some additional information about [facility] Outpatient Oncology Palliative Care:  ??  What is Palliative Care?  Palliative Care is medical care that focuses on managing symptoms of cancer or side effects from cancer treatment. The Outpatient Oncology Palliative Care service uses a team approach to help patients and their caregivers with the goal of maintaining the best quality of life possible during a cancer diagnosis and cancer treatment.   The Outpatient Oncology Palliative Care team serves all outpatient oncology clinics including surgery, gynecology, medicine and radiation oncology.   ??  Who provides Outpatient Palliative Care?  The Outpatient Oncology Palliative Care team includes doctors, nurses, pharmacists, social workers, counselors and nutritionists. The team works with a patient's primary oncology team to create care plans that can help manage symptoms related to cancer and cancer treatment.  ??  What services are offered by Outpatient Oncology Palliative Care?  We can help cancer patients with:  ?? Pain  ?? Symptoms such as nausea, vomiting, anxiety, depression, fatigue, loss of appetite of shortness of breath  ?? Emotional, psychological, or spiritual suffering  ?? Nutritional problems  ?? Concern about medical decisions  ?? Difficulty communicating values, goals and personal choices.  ??  Who are the Outpatient Oncology Palliative Care team members?  Physicians: Doreatha Martin, MD and  Kathlynn Grate, MD  Physician Fellows:  Tera Partridge, MD; Franchot Mimes, MD; Vertell Limber, MD  Clinical Pharmacist Practitioner:  Laurene Footman, PharmD, CPP Nurse Practitioner: Griselda Miner, RN, MSN, FNP-BC,  Nurse Clinical Coordinator: Allegra Lai, RN, BSN, MS, OCN  Administrative Specialist: Dolores Frame  ??  When can I see the Outpatient Oncology Palliative Care team and how do I make contact in between visits?  We see patients Monday through Friday 8am - 4:30pm. If you have questions or concerns during clinic hours, please contact us. Any requests made outside of business hours will be addressed the following business day.   ??  For appointments & questions Monday through Friday 8 AM??? 4:30 PM:  please call [phone] or Toll free [phone].  ??  On Nights, Weekends and Holidays:  Call [phone] and ask for the Oncologist on call.

## 2017-05-29 NOTE — Unmapped (Signed)
Lab drawn and sent for analysis.

## 2017-06-04 ENCOUNTER — Ambulatory Visit
Admission: RE | Admit: 2017-06-04 | Discharge: 2017-06-04 | Disposition: A | Discharge status: Home / Self Care | Payer: MEDICARE | Admitting: Geriatric Medicine

## 2017-06-04 DIAGNOSIS — C3490 Malignant neoplasm of unspecified part of unspecified bronchus or lung: Secondary | ICD-10-CM

## 2017-06-04 DIAGNOSIS — Z515 Encounter for palliative care: Secondary | ICD-10-CM

## 2017-06-04 DIAGNOSIS — G893 Neoplasm related pain (acute) (chronic): Secondary | ICD-10-CM

## 2017-06-04 DIAGNOSIS — C61 Malignant neoplasm of prostate: Principal | ICD-10-CM

## 2017-06-04 NOTE — Unmapped (Signed)
OUTPATIENT ONCOLOGY PALLIATIVE CARE    Principal Diagnosis: [name] is a 56 y/o M with unfortunately two known metastatic cancers including metastatic lung cancer dx 2/16 on nivolumab and metastatic prostate cancer dx 12/16 with known bony dx and T10 pathologic fx castrate resistant now on Radium 223 and enzalutamide who is referred to Palliative Care Clinic for help with ongoing pain/symptom management and GOC discussion. Recently his PSA has increased from 50.7 to 64.2 and prostate cancer physician believes he has prognosis of 1 month to 1 year but continuing radium 223 infusion, enzalutamide and lupron for now.       Assessment/Plan:   1.  Malignant pain: primarily lower back, significantly worsened from last visit, which he attributes to the change from oxy/APAP to oxy IR. His opioid usage has significantly increased in the past week and the only difference has been absence of APAP due to liver dysfunction. Discussed with patient my concern for a new process, such as pathologic fracture. Patient denies bowel/bladder incontinence and lower extremity weakness, so I am less concerned for cord compression. Recommended to patient additional work-up for this worsened pain however patient unwilling. Re-checked LFTs today, which are improving on steroids. Discussed with patient the concern for APAP usage with LFT abnormalities, however the risk has decreased with improved liver function. Will switch back to oxy/APAP, however discussed that he could have liver damage should he take more APAP than recommended. He verbalized understanding.    -Continue increased Oxycontin dose of 20 mg po BID. Patient reports taking 30 mg BID, however there is some disconnect between his report and BIL's report of 20mg  BID. Will cautiously do 20 mg dose.    - STOP oxycodone IR   - re-start oxycodone/APAP at 10/325mg  dose - 1 tab every 4 hr with a max of 6 tabs/day. Continue to monitor       2. Goals of Care/Advanced Care Planning Discussed with [name] out role in support him during this journey. He expressed sadness about his prognosis but also hope that he actually has may years left. His goals right now are to live as long as he can, but also enjoy the little things like visiting with family, sitting in the outdoors and staying independent as much as he can. He would like his sister, Bruce Parker, to be his HCPOA but has not formally filled out the paperwork yet. He does not want to be a burden on his family and if he becomes dependent for his ADLs he would like to go to a nursing home as he does not want them to have to take care of him. When he dies he would like to go peacefully in his sleep if possible. He has a strong faith and believes Jesus gives him only what he can handle and knows when it is his time. Does not have any formal Ads in place.    -continue to discuss GOC at each visit, today we mostly explored his current feelings and supported his hopes for the future. However I do worry his time is shorter than he would like and will discuss further GOC as we get to know him better   -will continue to encourage him to complete formal Ads, would like sister Bruce Parker to be HCPOA    # Controlled substances risk management.   - Patient has a signed pain medication agreement with Outpatient Palliative Care, completed on 05/21/17, as per standard of care.   - [place] database was reviewed today and it  was appropriate.   - Urine drug screen was performed 05/21/17. Findings: positive for oxymorphone only (metabolite of oxycodone)   - Patient has received information about safe storage and administration of medications.   - Patient has not received a prescription for narcan; not needed at this time    F/u: 1 week    ----------------------------------------  Referring Oncology Provider: Dr. Philomena Course (Prostate Cancer provider) and Dr. Sofie Hartigan (lung cancer provider)  PCP: Lonie Peak, [facility]      HPI:  [name] is a 56 y/o M with unfortunately two known metastatic cancers including metastatic lung cancer dx 2/16 on nivolumab and metastatic prostate cancer dx 12/16 with known bony dx and T10 pathologic fx castrate resistant now on Radium 223 and enzalutamide who is referred to Palliative Care Clinic for help with ongoing pain/symptom management and GOC discussion. Recently his PSA has increased from 50.7 to 64.2 and prostate cancer physician believes he has prognosis of 1 month to 1 year but continuing radium 223 infusion, enzalutamide and lupron    Current cancer-directed therapy: Currently receiving Q2 week nivolumab (s/p cycle #48) , radium 223 infusions, lupron and enzalutamide    Symptom Review:  General: significant worsening in pain. Patient upset about recent medication changes. States he wants to stop being treated like a lab rat. Brother-in-law states the man is going to die in the next year, just give him what he wants.  Pain: lower back pain - significantly worsened since his last visit, which he attributes to the change from oxy/APAP to oxy IR. Per his discussion with Dr. Jaquita Rector yesterday, patient increased oxycodone to 4x5 mg tabs - took BID. Also self-increased oxycontin to 30 mg BID, which he reports doing for the last 2 days. Brother-in-law thinks he's taking 20 mg BID, but patient states he's taking 30 mg BID. Not using APAP  Fatigue: worsened due to increased pain  Sleep: hasn't slept in days due to pain  Appetite: good, has recently gained some weight  Nausea: denies  Bowel function: reports daily BMs  Dyspnea: on 3L O2 at baseline, able to walk 10-15 ft then becomes SOB, no cough, no SOB with lying flat  Secretions: denies  Mood: frustrated    Palliative Performance Scale: 60% - Ambulation: Reduced / unable to do hobby or some housework, significant disease / Self-Care:Occasional assist as necessary / Intake:Normal or reduced / Level of Conscious: Full or confusion    Coping/Support Issues:   Denies any support or coping issues. Has strong family, friend and church support    Goals of Care:   Mr. Chervenak reports that his main goal is to live as long as he can. He would also like to live to next summer to enjoy the simple things in life including the fresh air, watching cars go by and taking care of himself. If he were to become to sick to take care of himself he would like to go to a nursing home so he would not be a burden for his family. If his time comes he would like to go in his sleep. He does not want surgery but otherwise is ok to continue aggressive treatments at this time, no formal Ads completed or in place    Social History:   Mr. Morella lives in [place] alone. However his sister Bruce Parker and [place] Darryl are actively involved in his care and check on him every day. His sister does most of his medications and his BIL takes him to all of his  appointments. He uses a walker to get around, able to do his own ADLs but gets help with cooking, laundry and does not drive. He uses 3L O2 24/7. He used to [employer], also worked as a Nurse, adult. Denies EtOH, previous smoker but quit, denies drugs.    Advance Care Planning:   Does not have any formal ADs in place. Would like his sister, Bruce Parker, to be his formal health care decision maker but has not completed the paperwork for his.   HCPOA: none  Natural surrogate decision maker: sister  Living Will: none  ACP note: none    Objective       Oncology History    --In 04/2014, PSA elevated to 56.4. DRE normal.  Prostate bx scheduled, but canceled by pt.  --In 09/2014, diagnosed with nonsmall cell lung cancer, stage IV (T2b, N2, M1b) with a large R hilar mass, mediastinal adenopathy, mets to bones. No uptake in liver or pancreas on PET scan. Insufficient material for molecular testing.  Guardant360 for ctDNA was negative.  Pt was subsequently treated with carboplatin/Alimta with initial good response.  Then disease progression.  On second line therapy with nivolumab.  --In 01/2015, PSA 62.2.  Seen in Urology clinic and referred to [facility] for possibility of prostate cancer.  After discussions about various options, underwent bone bx in 05/2015, nondiagnostic. PSA down to 23.7  --In 07/2015, prostate bx showed Gleason 4+5=9 adenocarcinoma.  --In 08/2015, androgen deprivation therapy with Lupron started.  PSA 354.  --In 11/2015, Lupron continued. PSA down to 1.7, which was the nadir. Subsequently, PSA began rising.  --In 06/2016, PSA up to 32.  Enzalutamide started.  MRI of brain neg. Bone scan stable.  --In 02/2017, PSA rise on enzalutamide. Bone scan stable. Radium 223 added        Lung cancer, primary, with metastasis from lung to other site (CMS-HCC) (Resolved)    10/11/2014 Initial Diagnosis     Lung cancer, primary, with metastasis from lung to other site [facility])           Prostate cancer (CMS-HCC)    07/27/2015 Initial Diagnosis     Prostate cancer ([facility]-HCC)            Patient Active Problem List   Diagnosis   ??? Derangement of right patella   ??? Hypertension, benign   ??? Osteoarthrosis   ??? Chronic renal disease, stage 3, moderately decreased glomerular filtration rate (GFR) between 30-59 mL/min/1.73 square meter (CMS-HCC)   ??? Lichen planus   ??? Weakness   ??? Lung mass   ??? Mediastinal lymphadenopathy   ??? Malignant neoplasm metastatic to bone (CMS-HCC)   ??? Pain of metastatic malignancy   ??? Non-small cell carcinoma of lung (CMS-HCC)   ??? Protein-calorie malnutrition (CMS-HCC)   ??? Acute respiratory failure (CMS-HCC)   ??? Shortness of breath   ??? Tobacco dependence syndrome   ??? Prostate cancer (CMS-HCC)       Past Medical History:   Diagnosis Date   ??? Lung cancer (CMS-HCC)    ??? On supplemental oxygen therapy        Past Surgical History:   Procedure Laterality Date   ??? APPENDECTOMY     ??? PR BRNCHSC EBUS GUIDED SAMPL 3/> NODE STATION/STRUX N/A 05/18/2015    Procedure: Bronch, Rigid Or Flexible, Including Fluoro Guidance, When Performed; W Ebus Guided Transtracheal And/Or Transbronchial Sampling, 3 Or More Mediastinal And/Or Hilar Lymph Node Stations Or Structures;  Surgeon: Mathis Bud,  MD;  Location: MAIN OR [place];  Service: Pulmonary   ??? small intestines removed per pt         Current Outpatient Prescriptions   Medication Sig Dispense Refill   ??? albuterol 2.5 mg /3 mL (0.083 %) nebulizer solution Inhale 2.5 mg by nebulization every four (4) hours as needed.      ??? AMITIZA 24 mcg capsule Take 24 mcg by mouth daily with breakfast.      ??? betamethasone dipropionate (DIPROLENE) 0.05 % ointment Apply twice daily to affected areas as needed for itching 45 g 2   ??? bicalutamide (CASODEX) 50 MG tablet Take 50 mg by mouth.     ??? diphenhydrAMINE (BENADRYL) 25 mg tablet Take 25 mg by mouth every six (6) hours as needed.      ??? enzalutamide (XTANDI) 40 mg cap capsule Take 4 capsules (160 mg total) by mouth daily. 120 capsule 11   ??? fluocinolone 0.01 % external oil Bid to back 120 mL 2   ??? folic acid (FOLVITE) 1 MG tablet Take 1 mg by mouth.     ??? food supplemt, lactose-reduced (ENSURE PLUS) 0.05-1.5 gram-kcal/mL liquid Take 237 mL by mouth daily.      ??? furosemide (LASIX) 20 MG tablet Take 20 mg by mouth.     ??? lactose-reduced food (ENSURE ENLIVE ORAL) Take 237 mL by mouth.     ??? levocetirizine (XYZAL) 5 MG tablet Take 5 mg by mouth.     ??? lidocaine-prilocaine (EMLA) cream Place on port site the morning of chemo     ??? magnesium oxide (MAG-OX) 400 mg tablet Take 400 mg by mouth.     ??? metoprolol tartrate (LOPRESSOR) 25 MG tablet Take 12.5 mg by mouth.     ??? mirtazapine (REMERON) 15 MG tablet Take 15 mg by mouth nightly.      ??? pantoprazole (PROTONIX) 40 MG tablet Take 40 mg by mouth.     ??? polyethylene glycol (MIRALAX) 17 gram packet Take 17 g by mouth daily.      ??? predniSONE (DELTASONE) 10 MG tablet      ??? prochlorperazine (COMPAZINE) 10 MG tablet Take 10 mg by mouth every eight (8) hours as needed.      ??? senna-docusate (PERICOLACE) 8.6-50 mg Take 1 tablet by mouth.     ??? sodium bicarbonate 650 mg tablet Take 650 mg by mouth.     ??? SUMAtriptan (IMITREX) 25 MG tablet      ??? tamsulosin (FLOMAX) 0.4 mg capsule Take 1 capsule (0.4 mg total) by mouth daily. 30 capsule 11   ??? triamcinolone (KENALOG) 0.1 % ointment Apply to arms twice a day until clear, then stop. (Patient taking differently: Apply 1 application topically two (2) times a day as needed. ) 30 g 1   ??? valACYclovir (VALTREX) 1000 MG tablet Take 1 tablet (1,000 mg total) by mouth daily. 30 tablet 0   ??? doxycycline (VIBRA-TABS) 100 MG tablet Take 1 tablet (100 mg total) by mouth daily. (Patient not taking: Reported on 05/29/2017) 30 tablet 0   ??? oxyCODONE (OXYCONTIN) 20 mg TR12 12 hr crush resistant tablet Take 1 tablet (20 mg total) by mouth every twelve (12) hours. 30 tablet 0   ??? oxyCODONE-acetaminophen (PERCOCET) 10-325 mg per tablet Take 1 tablet PO every 4 hours prn. May use 2 tablets for severe pain. Max of 6 tablets per day. 60 tablet 0     No current facility-administered medications for this visit.  Allergies: No Known Allergies    Family History:  Cancer-related family history includes Cancer in his brother. There is no history of Melanoma.  indicated that the status of his mother is unknown. He indicated that the status of his brother is unknown. He indicated that the status of his neg hx is unknown.         Lab Results   Component Value Date    CREATININE 2.12 (H) 05/29/2017     Lab Results   Component Value Date    ALKPHOS 408 (H) 05/29/2017    BILITOT 0.7 05/29/2017    PROT 7.1 05/21/2017    ALBUMIN 3.5 05/21/2017    ALT 193 (H) 05/29/2017    AST 135 (H) 05/29/2017        I personally spent over half of a total 45 minutes in counseling and discussion with the patient as described above.    Jenkins Rouge, PharmD, BCOP, CPP  Outpatient Oncology Palliative Care HPM fellow

## 2017-06-05 ENCOUNTER — Ambulatory Visit: Payer: Medicare Other | Admitting: Internal Medicine

## 2017-06-05 ENCOUNTER — Ambulatory Visit: Payer: Medicare Other

## 2017-06-05 ENCOUNTER — Other Ambulatory Visit: Payer: Medicare Other

## 2017-06-11 ENCOUNTER — Other Ambulatory Visit: Payer: Self-pay | Admitting: Medical Oncology

## 2017-06-11 DIAGNOSIS — C3491 Malignant neoplasm of unspecified part of right bronchus or lung: Secondary | ICD-10-CM

## 2017-06-12 ENCOUNTER — Encounter: Payer: Self-pay | Admitting: Oncology

## 2017-06-12 ENCOUNTER — Ambulatory Visit (HOSPITAL_BASED_OUTPATIENT_CLINIC_OR_DEPARTMENT_OTHER): Payer: Medicare Other | Admitting: Oncology

## 2017-06-12 ENCOUNTER — Encounter (HOSPITAL_COMMUNITY): Payer: Self-pay

## 2017-06-12 ENCOUNTER — Ambulatory Visit: Payer: Medicare Other

## 2017-06-12 ENCOUNTER — Other Ambulatory Visit (HOSPITAL_BASED_OUTPATIENT_CLINIC_OR_DEPARTMENT_OTHER): Payer: Medicare Other

## 2017-06-12 ENCOUNTER — Ambulatory Visit (HOSPITAL_COMMUNITY)
Admission: RE | Admit: 2017-06-12 | Discharge: 2017-06-12 | Disposition: A | Discharge status: Home / Self Care | Payer: Medicare Other | Source: Ambulatory Visit | Attending: Internal Medicine | Admitting: Internal Medicine

## 2017-06-12 VITALS — BP 126/78 | HR 125 | Temp 98.2°F | Resp 18 | Ht 69.0 in | Wt 169.9 lb

## 2017-06-12 DIAGNOSIS — C7951 Secondary malignant neoplasm of bone: Secondary | ICD-10-CM

## 2017-06-12 DIAGNOSIS — J439 Emphysema, unspecified: Secondary | ICD-10-CM | POA: Diagnosis not present

## 2017-06-12 DIAGNOSIS — C3401 Malignant neoplasm of right main bronchus: Secondary | ICD-10-CM

## 2017-06-12 DIAGNOSIS — I745 Embolism and thrombosis of iliac artery: Secondary | ICD-10-CM | POA: Diagnosis not present

## 2017-06-12 DIAGNOSIS — T50905A Adverse effect of unspecified drugs, medicaments and biological substances, initial encounter: Secondary | ICD-10-CM

## 2017-06-12 DIAGNOSIS — G893 Neoplasm related pain (acute) (chronic): Secondary | ICD-10-CM

## 2017-06-12 DIAGNOSIS — K716 Toxic liver disease with hepatitis, not elsewhere classified: Secondary | ICD-10-CM

## 2017-06-12 DIAGNOSIS — C61 Malignant neoplasm of prostate: Secondary | ICD-10-CM | POA: Diagnosis not present

## 2017-06-12 DIAGNOSIS — C3491 Malignant neoplasm of unspecified part of right bronchus or lung: Secondary | ICD-10-CM | POA: Diagnosis not present

## 2017-06-12 DIAGNOSIS — C801 Malignant (primary) neoplasm, unspecified: Secondary | ICD-10-CM

## 2017-06-12 DIAGNOSIS — C7889 Secondary malignant neoplasm of other digestive organs: Secondary | ICD-10-CM

## 2017-06-12 DIAGNOSIS — K4090 Unilateral inguinal hernia, without obstruction or gangrene, not specified as recurrent: Secondary | ICD-10-CM | POA: Insufficient documentation

## 2017-06-12 DIAGNOSIS — I700 Atherosclerosis of aorta: Secondary | ICD-10-CM | POA: Insufficient documentation

## 2017-06-12 DIAGNOSIS — C787 Secondary malignant neoplasm of liver and intrahepatic bile duct: Secondary | ICD-10-CM | POA: Diagnosis not present

## 2017-06-12 DIAGNOSIS — I2510 Atherosclerotic heart disease of native coronary artery without angina pectoris: Secondary | ICD-10-CM | POA: Diagnosis not present

## 2017-06-12 DIAGNOSIS — Q638 Other specified congenital malformations of kidney: Secondary | ICD-10-CM | POA: Diagnosis not present

## 2017-06-12 DIAGNOSIS — I951 Orthostatic hypotension: Secondary | ICD-10-CM

## 2017-06-12 DIAGNOSIS — G8929 Other chronic pain: Secondary | ICD-10-CM

## 2017-06-12 DIAGNOSIS — Z5112 Encounter for antineoplastic immunotherapy: Secondary | ICD-10-CM

## 2017-06-12 DIAGNOSIS — R5382 Chronic fatigue, unspecified: Secondary | ICD-10-CM

## 2017-06-12 DIAGNOSIS — N261 Atrophy of kidney (terminal): Secondary | ICD-10-CM | POA: Insufficient documentation

## 2017-06-12 DIAGNOSIS — C3490 Malignant neoplasm of unspecified part of unspecified bronchus or lung: Secondary | ICD-10-CM

## 2017-06-12 LAB — CBC WITH DIFFERENTIAL/PLATELET
BASO%: 0.2 % (ref 0.0–2.0)
Basophils Absolute: 0 10*3/uL (ref 0.0–0.1)
EOS%: 2.9 % (ref 0.0–7.0)
Eosinophils Absolute: 0.2 10*3/uL (ref 0.0–0.5)
HCT: 34.6 % — ABNORMAL LOW (ref 38.4–49.9)
HGB: 11.3 g/dL — ABNORMAL LOW (ref 13.0–17.1)
LYMPH%: 16.2 % (ref 14.0–49.0)
MCH: 32.8 pg (ref 27.2–33.4)
MCHC: 32.7 g/dL (ref 32.0–36.0)
MCV: 100.6 fL — ABNORMAL HIGH (ref 79.3–98.0)
MONO#: 1 10*3/uL — ABNORMAL HIGH (ref 0.1–0.9)
MONO%: 16.1 % — ABNORMAL HIGH (ref 0.0–14.0)
NEUT#: 3.8 10*3/uL (ref 1.5–6.5)
NEUT%: 64.6 % (ref 39.0–75.0)
Platelets: 226 10*3/uL (ref 140–400)
RBC: 3.44 10*6/uL — ABNORMAL LOW (ref 4.20–5.82)
RDW: 20 % — ABNORMAL HIGH (ref 11.0–14.6)
WBC: 5.9 10*3/uL (ref 4.0–10.3)
lymph#: 1 10*3/uL (ref 0.9–3.3)

## 2017-06-12 LAB — COMPREHENSIVE METABOLIC PANEL
ALT: 117 U/L — AB (ref 0–55)
AST: 116 U/L — AB (ref 5–34)
Albumin: 2.7 g/dL — ABNORMAL LOW (ref 3.5–5.0)
Alkaline Phosphatase: 505 U/L — ABNORMAL HIGH (ref 40–150)
Anion Gap: 10 mEq/L (ref 3–11)
BILIRUBIN TOTAL: 0.54 mg/dL (ref 0.20–1.20)
BUN: 46.7 mg/dL — AB (ref 7.0–26.0)
CALCIUM: 9.1 mg/dL (ref 8.4–10.4)
CHLORIDE: 108 meq/L (ref 98–109)
CO2: 18 meq/L — AB (ref 22–29)
CREATININE: 2.4 mg/dL — AB (ref 0.7–1.3)
EGFR: 33 mL/min/{1.73_m2} — ABNORMAL LOW (ref >60)
Glucose: 84 mg/dl (ref 70–140)
Potassium: 4.5 mEq/L (ref 3.5–5.1)
SODIUM: 137 meq/L (ref 136–145)
TOTAL PROTEIN: 6.7 g/dL (ref 6.4–8.3)

## 2017-06-12 MED ORDER — OXYCODONE HCL 5 MG PO TABS: 5.0000 mg | ORAL_TABLET | ORAL | 0 refills | Status: AC | PRN

## 2017-06-12 MED ORDER — IOPAMIDOL (ISOVUE-300) INJECTION 61%
INTRAVENOUS | Status: AC
  Filled 2017-06-12: qty 100

## 2017-06-12 MED ORDER — PREDNISONE 20 MG PO TABS: ORAL_TABLET | ORAL | 0 refills | Status: DC

## 2017-06-12 MED ORDER — IOPAMIDOL (ISOVUE-300) INJECTION 61%
80.0000 mL | Freq: Once | INTRAVENOUS | Status: AC | PRN
  Administered 2017-06-12: 80 mL via INTRAVENOUS

## 2017-06-12 MED ORDER — OXYCODONE ER 20 MG TABLET,CRUSH RESISTANT,EXTENDED RELEASE 12 HR
ORAL_TABLET | Freq: Two times a day (BID) | ORAL | 0 refills | 0 days | Status: CP
Start: 2017-06-12 — End: 2017-07-02

## 2017-06-12 MED ORDER — OXYCODONE-ACETAMINOPHEN 10 MG-325 MG TABLET
ORAL_TABLET | 0 refills | 0 days | Status: CP
Start: 2017-06-12 — End: 2017-07-02

## 2017-06-12 NOTE — Unmapped (Signed)
Notified pt brother Laban Emperor that both pain med scripts are at [facility] ready for pick up

## 2017-06-12 NOTE — Assessment & Plan Note (Signed)
This is a very pleasant 56 year old African-American male with metastatic non-small cell lung cancer, adenocarcinoma status post induction systemic chemotherapy was carboplatin and Alimta. He was started on second line treatment with Nivolumab 240 mg IV every 2 weeks is status post 48 cycles. He is currently on treatment with Nivolumab 480 MG IV every 4 weeks is status post 1 cycle. He tolerated the first cycle of this treatment fairly well, but developed elevated liver enzymes requiring hospitalization. He was discharged home on prednisone which he has completed.  The patient was seen with Dr. Julien Nordmann. Liver enzymes continues to decrease, but remain elevated. Lab results were discussed with the patient and his brother-in-law and we explained to him that his liver enzymes are too high to proceed with treatment today. CT scan results were discussed with the patient and his brother-in-law which overall shows stable disease.  We will plan to restart redness zone with a slow taper. He will take 80 mg daily for 2 weeks, then 60 mg daily for one week, then 40 mg daily for one week, then 20 g daily for one week, and then 10 mg daily, and then he will stop. A new prescription was sent to his pharmacy.  For his pain medication, we will change over from Percocet to plain oxycodone to eliminate the Tylenol. A new prescription was given to him today.  The patient will return for reevaluation of his lab work in approximately 4-5 weeks.  The patient was advised to call immediately if he has any concerning symptoms in the interval. The patient voices understanding of current disease status and treatment options and is in agreement with the current care plan. All questions were answered. The patient knows to call the clinic with any problems, questions or concerns. We can certainly see the patient much sooner if necessary.

## 2017-06-12 NOTE — Progress Notes (Signed)
[facility]:    Francisco Bears, MD [address] [place] [address]   DIAGNOSIS:  1) Stage IV (T2b, N2, M1b) non-small cell lung cancer, adenocarcinoma with negative EGFR mutation and negative gene translocation presented with a large right hilar mass as well as mediastinal lymphadenopathy and metastatic disease to the liver, bone and pancreas diagnosed in February 2016. 2) prostate adenocarcinoma diagnosed at [facility] with Gleason score 9 (4+5). 3) treatment with Radium 223 for prostate cancer at [facility] on 02/21/2017  SUMMARY OF ONCOLOGIC HISTORY:  No history exists.   PRIOR THERAPY:  1) Systemic chemotherapy with carboplatin for AUC of 5 and Alimta 500 MG/M2 every 3 weeks. Status post 6 cycles. 2) Enzalutamide for prostate cancer at [facility] 3) Immunotherapy with Nivolumab 240 MG every 2 weeks, status post 48 cycles. 4) Radium 223 monthly at [facility], S/p 2 cycles.  CURRENT THERAPY: Immunotherapy with Nivolumab 480 MG every 4 weeks, for a cycle 04/10/2017.  INTERVAL HISTORY: Francisco Love 57 y.o. male returns for a routine follow-up visit today with his brother-in-law. The patient is feeling fine today with no specific complaints except for his ongoing back pain and right hip pain which is unchanged. The patient reports that he uses Percocet about every 3-4 hours. He had a recent hospitalization for drug-induced hepatitis. He was discharged home on a prednisone taper and has completed this. The patient's liver enzymes had improved upon hospital discharge, but did not normalize. The patient denies fevers and chills. Denies chest pain, shortness of breath at rest, cough, hemoptysis. Reports dyspnea on exertion and he continues to wear O2 at 3 L/m. Patient denies having any weight loss or night sweats. Denies nausea, vomiting, constipation, diarrhea. The patient has not received additional Nivolumab since August  2018 due to elevated liver enzymes. The patient is here for evaluation prior to cycle 2 of Nivolumab 480 mg every 4 weeks.   Patient Active Problem List   Diagnosis Date Noted  . Elevated liver enzymes 05/15/2017  . Drug-induced hepatitis 05/15/2017  . Cystic mass of pancreas   . Goals of care, counseling/discussion 03/27/2017  . PSVT (paroxysmal supraventricular tachycardia) ([place]) 12/24/2016  . Tachycardia 12/21/2016  . Tachypnea 12/21/2016  . Metabolic acidosis, NAG, bicarbonate losses 12/21/2016  . CKD (chronic kidney disease) stage 4, 12/21/2016  . Chronic pain 04/12/2016  . Chronic fatigue 04/12/2016  . Neoplasm related pain 06/08/2015  . Encounter for antineoplastic immunotherapy 05/11/2015  . Encounter for antineoplastic chemotherapy 01/24/2015  . Non-small cell cancer of right lung ([place]) 10/06/2014  . Penile lesion 09/28/2014  . Tobacco abuse 09/28/2014  . Protein-calorie malnutrition ([place]) 09/28/2014  . Debility 09/23/2014  . Acute respiratory failure with hypoxia ([place]) 09/23/2014  . Mediastinal lymphadenopathy   . Metastatic adenocarcinoma involving skeletal bone with unknown primary site [facility])   . Lung mass 09/18/2014  . Shortness of breath 09/18/2014    has No Known Allergies.  MEDICAL HISTORY: Past Medical History:  Diagnosis Date  . Chronic fatigue 04/12/2016  . Chronic pain 04/12/2016  . Chronic renal disease, stage III ([place])   . Metastatic cancer ([place]) dx'd 09/2014    SURGICAL HISTORY: Past Surgical History:  Procedure Laterality Date  . APPENDECTOMY    . EUS N/A 03/28/2017   Procedure: UPPER ENDOSCOPIC ULTRASOUND (EUS) RADIAL;  Surgeon: Milus Banister, MD;  Location: [facility] ENDOSCOPY;  Service: Endoscopy;  Laterality: N/A;  . VIDEO BRONCHOSCOPY N/A 09/21/2014  Procedure: VIDEO BRONCHOSCOPY WITH FLUORO;  Surgeon: Oretha Milch, MD;  [facility];  Service: Cardiopulmonary;  Laterality: N/A;  . VIDEO BRONCHOSCOPY WITH ENDOBRONCHIAL ULTRASOUND N/A  09/23/2014   Procedure: VIDEO BRONCHOSCOPY WITH ENDOBRONCHIAL ULTRASOUND;  Surgeon: Oretha Milch, MD;  Location: [facility] OR;  Service: Pulmonary;  Laterality: N/A;    SOCIAL HISTORY: Social History   Social History  . Marital status: Single    Spouse name: N/A  . Number of children: N/A  . Years of education: N/A   Occupational History  . Not on file.   Social History Main Topics  . Smoking status: Former Smoker    Packs/day: 1.00    Types: Cigarettes    Quit date: 06/20/2014  . Smokeless tobacco: Never Used  . Alcohol use 3.6 oz/week    6 Cans of beer per week  . Drug use: Yes    Types: Marijuana  . Sexual activity: Not Currently   Other Topics Concern  . Not on file   Social History Narrative  . No narrative on file    FAMILY HISTORY: Family History  Problem Relation Age of Onset  . Lung cancer Brother     Review of Systems  Constitutional: Positive for fatigue. Negative for appetite change, chills, fever and unexpected weight change.  HENT:  Negative.   Eyes: Negative.   Respiratory: Negative for cough and hemoptysis.        Positive for dyspnea on exertion.  Cardiovascular: Negative.   Gastrointestinal: Negative.   Genitourinary: Negative.    Musculoskeletal: Positive for back pain.       Right hip pain.  Skin: Negative.   Neurological: Negative.   Hematological: Negative.   Psychiatric/Behavioral: Negative.       PHYSICAL EXAMINATION  ECOG PERFORMANCE STATUS: 1 - Symptomatic but completely ambulatory  Vitals:   06/12/17 0823  BP: 126/78  Pulse: (!) 125  Resp: 18  Temp: 98.2 F (36.8 C)  SpO2: 98%    Physical Exam  Constitutional: He is oriented to person, place, and time and well-developed, well-nourished, and in no distress.  HENT:  Head: Normocephalic and atraumatic.  Mouth/Throat: Oropharynx is clear and moist. No oropharyngeal exudate.  Eyes: Conjunctivae are normal. Right eye exhibits no discharge. Left eye exhibits no discharge. No  scleral icterus.  Neck: Normal range of motion. Neck supple.  Cardiovascular: Normal rate, regular rhythm, normal heart sounds and intact distal pulses.   Pulmonary/Chest: Effort normal and breath sounds normal. No respiratory distress. He has no wheezes. He has no rales.  Abdominal: Soft. Bowel sounds are normal. He exhibits no distension. There is no tenderness. There is no rebound.  Musculoskeletal: Normal range of motion. He exhibits no edema.  Lymphadenopathy:    He has no cervical adenopathy.  Neurological: He is alert and oriented to person, place, and time. He exhibits normal muscle tone. Coordination normal.  Skin: Skin is warm and dry. No rash noted. No erythema. No pallor.  Psychiatric: Mood, memory, affect and judgment normal.  Vitals reviewed.   LABORATORY DATA:  CBC    Component Value Date/Time   WBC 5.9 06/12/2017 0756   WBC 6.6 05/18/2017 0500   RBC 3.44 (L) 06/12/2017 0756   RBC 2.92 (L) 05/18/2017 0500   HGB 11.3 (L) 06/12/2017 0756   HCT 34.6 (L) 06/12/2017 0756   PLT 226 06/12/2017 0756   MCV 100.6 (H) 06/12/2017 0756   MCH 32.8 06/12/2017 0756   MCH 31.8 05/18/2017 0500   MCHC  32.7 06/12/2017 0756   MCHC 34.1 05/18/2017 0500   RDW 20.0 (H) 06/12/2017 0756   LYMPHSABS 1.0 06/12/2017 0756   MONOABS 1.0 (H) 06/12/2017 0756   EOSABS 0.2 06/12/2017 0756   BASOSABS 0.0 06/12/2017 0756    CMP     Component Value Date/Time   NA 137 06/12/2017 0756   K 4.5 06/12/2017 0756   CL 114 (H) 05/18/2017 0500   CO2 18 (L) 06/12/2017 0756   GLUCOSE 84 06/12/2017 0756   BUN 46.7 (H) 06/12/2017 0756   CREATININE 2.4 (H) 06/12/2017 0756   CALCIUM 9.1 06/12/2017 0756   PROT 6.7 06/12/2017 0756   ALBUMIN 2.7 (L) 06/12/2017 0756   AST 116 (H) 06/12/2017 0756   ALT 117 (H) 06/12/2017 0756   ALKPHOS 505 (H) 06/12/2017 0756   BILITOT 0.54 06/12/2017 0756   GFRNONAA 46 (L) 05/18/2017 0500   GFRAA 53 (L) 05/18/2017 0500   RADIOGRAPHIC STUDIES:  Ct Chest W  Contrast  Result Date: 06/12/2017 CLINICAL DATA:  Restaging of static non-small cell lung cancer, ongoing chemotherapy. Home oxygen therapy for hypoxia. EXAM: CT CHEST, ABDOMEN, AND PELVIS WITH CONTRAST TECHNIQUE: Multidetector CT imaging of the chest, abdomen and pelvis was performed following the standard protocol during bolus administration of intravenous contrast. CONTRAST:  47m ISOVUE-300 IOPAMIDOL (ISOVUE-300) INJECTION 61% COMPARISON:  Multiple exams, including CT scans from 05/15/2017 and 01/17/2017, and MRI spine from 04/01/2017 FINDINGS: CT CHEST FINDINGS Cardiovascular: Coronary, aortic arch, and branch vessel atherosclerotic vascular disease. Mediastinum/Nodes: Right paratracheal mass with questionable right upper lobe involvement, 5.8 by 3.8 cm on image 23/2, formerly 5.6 by 3.2 cm by my measurements. Subcarinal node 1.4 cm in short axis, image 34/2, formerly 1.3 cm by my measurements. AP window lymph node 0.9 cm in short axis on image 28/2, formerly 0.8 cm by my measurements. Lungs/Pleura: Stable posterior pleural thickening bilaterally. Stable bilateral emphysema. Biapical pleuroparenchymal scarring. There is some worsening in dependent ground-glass densities in both lower lobes and to a lesser extent in the right middle lobe. Nodularity within a cluster of bulla in the right lower lobe measuring 0.7 by 0.5 cm on image 89/6, stable. Partially calcified nodule in the left upper lobe surrounded by bulla, 0.6 cm in diameter on image 72/6, stable. 0.5 cm nodule in the superior segment left lower lobe on image 64/6, previously measured 6 mm in diameter. No new nodules identified. Musculoskeletal: Widespread osseous metastatic disease throughout the thorax, slightly more sclerotic than on 01/17/2017 but not changed in overall distribution compared to that time. The involves essentially all vertebral levels along with the ribs and sternum as well as both scapula. CT ABDOMEN PELVIS FINDINGS Hepatobiliary:  Stable 1.0 by 0.8 cm hypodense lesion in segment 2 of the liver on image 52/2, not appreciably changed from 01/17/2017. Minimally contracted gallbladder. Pancreas: Partially calcified and partially hypodense lesion in the head of the pancreas measuring approximately 2.4 by 1.8 cm on image 71/2, stable from the PET-CT of 10/15/2014 where this lesion was not hypermetabolic. There is dilatation of the dorsal pancreatic duct in the pancreatic body up to 7 mm in diameter, image 66/2. This dilatation may well be due to extrinsic compression of the dorsal pancreatic duct by the pancreatic head lesion. Spleen: Unremarkable Adrenals/Urinary Tract: Adrenal glands unremarkable. Duplicated right renal collecting system. Mildly atrophic left kidney. Urinary bladder unremarkable. Stomach/Bowel: Unremarkable Vascular/Lymphatic: Aortoiliac atherosclerotic vascular disease. There has long been stenosis of the left common iliac artery, for example on the exam from 12/24/2014, but  the left proximal common iliac artery is now occluded the, and reconstituted after a short segment by collateral vessels. In the area of the bifurcation and occlusion there is surrounding soft tissue stranding/density, which has been chronic. Reproductive: Unremarkable Other: No supplemental non-categorized findings. Musculoskeletal: Stable distribution of widespread osseous metastatic disease. Severe degenerative arthropathy of both hips with chronic flattening and volume loss in both femoral heads. New superior endplate compression fracture at L5 with new 30% loss of height and no significant bony retropulsion. Previous superior endplate compression fracture of L1, stable. Notable Schmorl's node along the superior endplate of L5, as before. Direct left inguinal hernia containing adipose tissue. IMPRESSION: 1. The dominant right paratracheal mass is mildly enlarged compared to prior chest CT of 03/27/2017, and thoracic lymph nodes are borderline increased  in size compared to that time as well. 2. Several small pulmonary nodules are stable in size. 3. Interval mild worsening in dependent ground-glass densities in both lower lobes and to a lesser extent in the right middle lobe, possibly from low-grade edema or drug reaction. 4. Stable distribution of widespread osseous metastatic disease. However, there is a new 30% superior endplate compression fracture at L5 compared to 9/20 6/18. 5. Chronic occlusion of the left common iliac artery with reconstitution after a short segment. 6. Other imaging findings of potential clinical significance: Aortic Atherosclerosis (ICD10-I70.0) and Emphysema (ICD10-J43.9). Coronary atherosclerosis. Chronic hypodense lesion in segment 2 of the liver, likely benign. Cystic and calcified lesion in the head of the pancreas is chronically stable and was not previously hypermetabolic, although entities such as intraductal papillary mucinous neoplasm or serous cystadenoma are not excluded ; there is dilatation of the dorsal pancreatic duct in the pancreatic body. Duplicated right renal collecting system. Mild atrophy of the left kidney. Severe arthropathy of both hips with flattening of the femoral heads, query dysplastic hips. Direct left inguinal hernia containing adipose tissue. Electronically Signed   By: Van Clines M.D.   On: 06/12/2017 08:44   Ct Abdomen Pelvis W Contrast  Result Date: 06/12/2017 CLINICAL DATA:  Restaging of static non-small cell lung cancer, ongoing chemotherapy. Home oxygen therapy for hypoxia. EXAM: CT CHEST, ABDOMEN, AND PELVIS WITH CONTRAST TECHNIQUE: Multidetector CT imaging of the chest, abdomen and pelvis was performed following the standard protocol during bolus administration of intravenous contrast. CONTRAST:  72m ISOVUE-300 IOPAMIDOL (ISOVUE-300) INJECTION 61% COMPARISON:  Multiple exams, including CT scans from 05/15/2017 and 01/17/2017, and MRI spine from 04/01/2017 FINDINGS: CT CHEST FINDINGS  Cardiovascular: Coronary, aortic arch, and branch vessel atherosclerotic vascular disease. Mediastinum/Nodes: Right paratracheal mass with questionable right upper lobe involvement, 5.8 by 3.8 cm on image 23/2, formerly 5.6 by 3.2 cm by my measurements. Subcarinal node 1.4 cm in short axis, image 34/2, formerly 1.3 cm by my measurements. AP window lymph node 0.9 cm in short axis on image 28/2, formerly 0.8 cm by my measurements. Lungs/Pleura: Stable posterior pleural thickening bilaterally. Stable bilateral emphysema. Biapical pleuroparenchymal scarring. There is some worsening in dependent ground-glass densities in both lower lobes and to a lesser extent in the right middle lobe. Nodularity within a cluster of bulla in the right lower lobe measuring 0.7 by 0.5 cm on image 89/6, stable. Partially calcified nodule in the left upper lobe surrounded by bulla, 0.6 cm in diameter on image 72/6, stable. 0.5 cm nodule in the superior segment left lower lobe on image 64/6, previously measured 6 mm in diameter. No new nodules identified. Musculoskeletal: Widespread osseous metastatic disease throughout the thorax, slightly  more sclerotic than on 01/17/2017 but not changed in overall distribution compared to that time. The involves essentially all vertebral levels along with the ribs and sternum as well as both scapula. CT ABDOMEN PELVIS FINDINGS Hepatobiliary: Stable 1.0 by 0.8 cm hypodense lesion in segment 2 of the liver on image 52/2, not appreciably changed from 01/17/2017. Minimally contracted gallbladder. Pancreas: Partially calcified and partially hypodense lesion in the head of the pancreas measuring approximately 2.4 by 1.8 cm on image 71/2, stable from the PET-CT of 10/15/2014 where this lesion was not hypermetabolic. There is dilatation of the dorsal pancreatic duct in the pancreatic body up to 7 mm in diameter, image 66/2. This dilatation may well be due to extrinsic compression of the dorsal pancreatic duct by  the pancreatic head lesion. Spleen: Unremarkable Adrenals/Urinary Tract: Adrenal glands unremarkable. Duplicated right renal collecting system. Mildly atrophic left kidney. Urinary bladder unremarkable. Stomach/Bowel: Unremarkable Vascular/Lymphatic: Aortoiliac atherosclerotic vascular disease. There has long been stenosis of the left common iliac artery, for example on the exam from 12/24/2014, but the left proximal common iliac artery is now occluded the, and reconstituted after a short segment by collateral vessels. In the area of the bifurcation and occlusion there is surrounding soft tissue stranding/density, which has been chronic. Reproductive: Unremarkable Other: No supplemental non-categorized findings. Musculoskeletal: Stable distribution of widespread osseous metastatic disease. Severe degenerative arthropathy of both hips with chronic flattening and volume loss in both femoral heads. New superior endplate compression fracture at L5 with new 30% loss of height and no significant bony retropulsion. Previous superior endplate compression fracture of L1, stable. Notable Schmorl's node along the superior endplate of L5, as before. Direct left inguinal hernia containing adipose tissue. IMPRESSION: 1. The dominant right paratracheal mass is mildly enlarged compared to prior chest CT of 03/27/2017, and thoracic lymph nodes are borderline increased in size compared to that time as well. 2. Several small pulmonary nodules are stable in size. 3. Interval mild worsening in dependent ground-glass densities in both lower lobes and to a lesser extent in the right middle lobe, possibly from low-grade edema or drug reaction. 4. Stable distribution of widespread osseous metastatic disease. However, there is a new 30% superior endplate compression fracture at L5 compared to 9/20 6/18. 5. Chronic occlusion of the left common iliac artery with reconstitution after a short segment. 6. Other imaging findings of potential  clinical significance: Aortic Atherosclerosis (ICD10-I70.0) and Emphysema (ICD10-J43.9). Coronary atherosclerosis. Chronic hypodense lesion in segment 2 of the liver, likely benign. Cystic and calcified lesion in the head of the pancreas is chronically stable and was not previously hypermetabolic, although entities such as intraductal papillary mucinous neoplasm or serous cystadenoma are not excluded ; there is dilatation of the dorsal pancreatic duct in the pancreatic body. Duplicated right renal collecting system. Mild atrophy of the left kidney. Severe arthropathy of both hips with flattening of the femoral heads, query dysplastic hips. Direct left inguinal hernia containing adipose tissue. Electronically Signed   By: Van Clines M.D.   On: 06/12/2017 08:44   ASSESSMENT and THERAPY PLAN:   Non-small cell cancer of right lung [facility]) This is a very pleasant 56 year old African-American male with metastatic non-small cell lung cancer, adenocarcinoma status post induction systemic chemotherapy was carboplatin and Alimta. He was started on second line treatment with Nivolumab 240 mg IV every 2 weeks is status post 48 cycles. He is currently on treatment with Nivolumab 480 MG IV every 4 weeks is status post 1 cycle. He  tolerated the first cycle of this treatment fairly well, but developed elevated liver enzymes requiring hospitalization. He was discharged home on prednisone which he has completed.  The patient was seen with Dr. Julien Nordmann. Liver enzymes continues to decrease, but remain elevated. Lab results were discussed with the patient and his brother-in-law and we explained to him that his liver enzymes are too high to proceed with treatment today. CT scan results were discussed with the patient and his brother-in-law which overall shows stable disease.  We will plan to restart redness zone with a slow taper. He will take 80 mg daily for 2 weeks, then 60 mg daily for one week, then 40 mg daily for  one week, then 20 g daily for one week, and then 10 mg daily, and then he will stop. A new prescription was sent to his pharmacy.  For his pain medication, we will change over from Percocet to plain oxycodone to eliminate the Tylenol. A new prescription was given to him today.  The patient will return for reevaluation of his lab work in approximately 4-5 weeks.  The patient was advised to call immediately if he has any concerning symptoms in the interval. The patient voices understanding of current disease status and treatment options and is in agreement with the current care plan. All questions were answered. The patient knows to call the clinic with any problems, questions or concerns. We can certainly see the patient much sooner if necessary.   No orders of the defined types were placed in this encounter.   All questions were answered. The patient knows to call the clinic with any problems, questions or concerns. We can certainly see the patient much sooner if necessary.  Mikey Bussing, NP 06/12/2017   ADDENDUM: Hematology/Oncology Attending: I had a face to face encounter with the patient. I recommended his care plan. This is a very pleasant 56 years old African-American male with metastatic non-small cell lung cancer status post several chemotherapy regimens and he is currently on treatment with Nivolumab 480 MG IV every 4 weeks status post one cycle of the high-dose regimen. His treatment has been on hold for the last few weeks because of significant elevation of his liver enzymes. This is likely secondary to immunotherapy-induced hepatitis. I recommended for the patient to start high-dose prednisone 1 MG/KG for the first 2 weeks followed by a tapered dose over the next 4-5 weeks. I may consider changing the patient back to the 2 weekly schedule on Nivolumab. His recent CT scan of the chest, abdomen and pelvis showed no significant disease progression. His disease has been stable for  the last few years. We will arrange for the patient come back for follow-up visit in 5 weeks for reevaluation before resuming his treatment with immunotherapy. For the chronic back pain, he will continue on Percocet on as-needed basis. The patient was advised to call immediately if he has any concerning symptoms in the interval.  Disclaimer: This note was dictated with voice recognition software. Similar sounding words can inadvertently be transcribed and may be missed upon review. Eilleen Kempf, MD 06/16/17

## 2017-06-13 ENCOUNTER — Telehealth: Payer: Self-pay | Admitting: Oncology

## 2017-06-13 NOTE — Telephone Encounter (Signed)
Cancelled appt per 10/24 los - patient aware of other appt.

## 2017-06-18 NOTE — Unmapped (Signed)
OUTPATIENT ONCOLOGY PALLIATIVE CARE    Principal Diagnosis: Mr. Corsino is a 56 y/o M with unfortunately two known metastatic cancers including metastatic lung cancer dx 2/16 on nivolumab and metastatic prostate cancer dx 12/16 with known bony dx and T10 pathologic fx castrate resistant now on Radium 223 and enzalutamide who is referred to Palliative Care Clinic for help with ongoing pain/symptom management and GOC discussion. Recently his PSA has increased from 50.7 to 64.2 and prostate cancer physician believes he has prognosis of 1 month to 1 year but continuing radium 223 infusion, enzalutamide and lupron for now.       Assessment/Plan:   1.  Malignant pain: primarily lower back, improving, most likely associated with metastatic prostate cancer    Plan:  -- continue oxycontin 20 mg bid  -- continue percocet 10/325 up to 4x/day for breakthrough pain given improvement in LFTs.    2. Goals of Care/Advanced Care Planning  Previously Discussed with Mr. Ryder out role in support him during this journey. He expressed sadness about his prognosis but also hope that he actually has may years left. His goals right now are to live as long as he can, but also enjoy the little things like visiting with family, sitting in the outdoors and staying independent as much as he can. He would like his sister, [name], to be his HCPOA but has not formally filled out the paperwork yet. He does not want to be a burden on his family and if he becomes dependent for his ADLs he would like to go to a nursing home as he does not want them to have to take care of him. When he dies he would like to go peacefully in his sleep if possible. He has a strong faith and believes Jesus gives him only what he can handle and knows when it is his time. Does not have any formal Ads in place.     Plan:  -- continue to discuss GOC at each visit, today we mostly explored his current feelings and supported his hopes for the future. However I do worry his time is shorter than he would like and will discuss further GOC as we get to know him better  -- refer to patient relations for completion of formal Ads, would like sister [name] to be HCPOA    # Controlled substances risk management.   - Patient has a signed pain medication agreement with Outpatient Palliative Care, completed on 05/21/17, as per standard of care.   - [place]CCSRS database was reviewed today and it was appropriate.   - Urine drug screen was performed 05/21/17. Findings: positive for oxymorphone only (metabolite of oxycodone)   - Patient has received information about safe storage and administration of medications.   - Patient has not received a prescription for narcan; not needed at this time    F/u: 2 weeks  ----------------------------------------  Referring Oncology Provider: Dr. Philomena Course (Prostate Cancer provider) and Dr. Sofie Hartigan (lung cancer provider)  PCP: Lonie Peak, [facility]      HPI:  Mr. Goeller is a 56 y/o M with unfortunately two known metastatic cancers including metastatic lung cancer dx 2/16 on nivolumab and metastatic prostate cancer dx 12/16 with known bony dx and T10 pathologic fx castrate resistant now on Radium 223 and enzalutamide who is referred to Palliative Care Clinic for help with ongoing pain/symptom management and GOC discussion. Recently his PSA has increased from 50.7 to 64.2 and prostate cancer physician believes he has prognosis of 1  month to 1 year but continuing radium 223 infusion, enzalutamide and lupron    Current cancer-directed therapy: Currently receiving Q2 week nivolumab (s/p cycle #48) , radium 223 infusions, lupron and enzalutamide    Interval history, 06/04/17, GW: follow-up visit - brother in law also participated in this visit  -- reports having improved pain control since restarting percocet.  States that he's taking oxycontin 20 mg twice a day and percocet 10/325 up to 4x/day.  Notes improved sleep in particular.  -- denies any change in breathing - oxygen requirement stable  -- denies n/v, constipation.    -- from review of most recent labs, creat increased to 2.1, transaminases decreasing though remain > 100    Palliative Performance Scale: 60% - Ambulation: Reduced / unable to do hobby or some housework, significant disease / Self-Care:Occasional assist as necessary / Intake:Normal or reduced / Level of Conscious: Full or confusion    Coping/Support Issues:   Denies any support or coping issues. Has strong family, friend and church support    Goals of Care:   Mr. Shell reports that his main goal is to live as long as he can. He would also like to live to next summer to enjoy the simple things in life including the fresh air, watching cars go by and taking care of himself. If he were to become to sick to take care of himself he would like to go to a nursing home so he would not be a burden for his family. If his time comes he would like to go in his sleep. He does not want surgery but otherwise is ok to continue aggressive treatments at this time, no formal Ads completed or in place    Social History:   Mr. Lipsett lives in [place] alone. However his sister [name] and [place] Darryl are actively involved in his care and check on him every day. His sister does most of his medications and his BIL takes him to all of his appointments. He uses a walker to get around, able to do his own ADLs but gets help with cooking, laundry and does not drive. He uses [employer] 24/7. He used to [employer], also worked as a Nurse, adult. Denies EtOH, previous smoker but quit, denies drugs.    Advance Care Planning:   Does not have any formal ADs in place. Would like his sister, [name], to be his formal health care decision maker but has not completed the paperwork for his.   HCPOA: none  Natural surrogate decision maker: sister  Living Will: none  ACP note: none    Objective       Oncology History    --In 04/2014, PSA elevated to 56.4. DRE normal.  Prostate bx scheduled, but canceled by pt.  --In 09/2014, diagnosed with nonsmall cell lung cancer, stage IV (T2b, N2, M1b) with a large R hilar mass, mediastinal adenopathy, mets to bones. No uptake in liver or pancreas on PET scan. Insufficient material for molecular testing.  Guardant360 for ctDNA was negative.  Pt was subsequently treated with carboplatin/Alimta with initial good response.  Then disease progression.  On second line therapy with nivolumab.  --In 01/2015, PSA 62.2.  Seen in Urology clinic and referred to [facility] for possibility of prostate cancer.  After discussions about various options, underwent bone bx in 05/2015, nondiagnostic. PSA down to 23.7  --In 07/2015, prostate bx showed Gleason 4+5=9 adenocarcinoma.  --In 08/2015, androgen deprivation therapy with Lupron started.  PSA 354.  --In 11/2015, Lupron continued. PSA down to 1.7, which was the nadir. Subsequently, PSA began rising.  --In 06/2016, PSA up to 32.  Enzalutamide started.  MRI of brain neg. Bone scan stable.  --In 02/2017, PSA rise on enzalutamide. Bone scan stable. Radium 223 added        Lung cancer, primary, with metastasis from lung to other site (CMS-HCC) (Resolved)    10/11/2014 Initial Diagnosis     Lung cancer, primary, with metastasis from lung to other site [facility])           Prostate cancer (CMS-HCC)    07/27/2015 Initial Diagnosis     Prostate cancer (RAF-HCC)            Patient Active Problem List   Diagnosis   ??? Derangement of right patella   ??? Hypertension, benign   ??? Osteoarthrosis   ??? Chronic renal disease, stage 3, moderately decreased glomerular filtration rate (GFR) between 30-59 mL/min/1.73 square meter (CMS-HCC)   ??? Lichen planus   ??? Weakness   ??? Lung mass   ??? Mediastinal lymphadenopathy   ??? Malignant neoplasm metastatic to bone (CMS-HCC)   ??? Pain of metastatic malignancy   ??? Non-small cell carcinoma of lung (CMS-HCC)   ??? Protein-calorie malnutrition (CMS-HCC)   ??? Acute respiratory failure (CMS-HCC)   ??? Shortness of breath   ??? Tobacco dependence syndrome   ??? Prostate cancer (CMS-HCC)       Past Medical History:   Diagnosis Date   ??? Lung cancer (CMS-HCC)    ??? On supplemental oxygen therapy        Past Surgical History:   Procedure Laterality Date   ??? APPENDECTOMY     ??? PR BRNCHSC EBUS GUIDED SAMPL 3/> NODE STATION/STRUX N/A 05/18/2015    Procedure: Bronch, Rigid Or Flexible, Including Fluoro Guidance, When Performed; W Ebus Guided Transtracheal And/Or Transbronchial Sampling, 3 Or More Mediastinal And/Or Hilar Lymph Node Stations Or Structures;  Surgeon: Mathis Bud, MD;  Location: MAIN OR [facility];  Service: Pulmonary   ??? small intestines removed per pt         Current Outpatient Prescriptions   Medication Sig Dispense Refill   ??? albuterol 2.5 mg /3 mL (0.083 %) nebulizer solution Inhale 2.5 mg by nebulization every four (4) hours as needed.      ??? AMITIZA 24 mcg capsule Take 24 mcg by mouth daily with breakfast.      ??? atorvastatin (LIPITOR) 40 MG tablet      ??? betamethasone dipropionate (DIPROLENE) 0.05 % ointment Apply twice daily to affected areas as needed for itching 45 g 2   ??? bicalutamide (CASODEX) 50 MG tablet Take 50 mg by mouth.     ??? enzalutamide (XTANDI) 40 mg cap capsule Take 4 capsules (160 mg total) by mouth daily. 120 capsule 11   ??? fluocinolone 0.01 % external oil Bid to back 120 mL 2   ??? folic acid (FOLVITE) 1 MG tablet Take 1 mg by mouth.     ??? food supplemt, lactose-reduced (ENSURE PLUS) 0.05-1.5 gram-kcal/mL liquid Take 237 mL by mouth daily.      ??? furosemide (LASIX) 20 MG tablet Take 20 mg by mouth.     ??? levocetirizine (XYZAL) 5 MG tablet Take 5 mg by mouth.     ??? lidocaine-prilocaine (EMLA) cream Place on port site the morning of chemo     ??? magnesium oxide (MAG-OX) 400 mg tablet Take 400 mg by mouth.     ???  metoprolol tartrate (LOPRESSOR) 25 MG tablet Take 12.5 mg by mouth.     ??? mirtazapine (REMERON) 15 MG tablet Take 15 mg by mouth nightly.      ??? oxyCODONE (OXYCONTIN) 20 mg TR12 12 hr crush resistant tablet Take 1 tablet (20 mg total) by mouth every twelve (12) hours. 60 tablet 0   ??? oxyCODONE-acetaminophen (PERCOCET) 10-325 mg per tablet Take 1 tablet PO every 4 hours prn. May use 2 tablets for severe pain. Maximum of 4 tablets per day. 120 tablet 0   ??? pantoprazole (PROTONIX) 40 MG tablet Take 40 mg by mouth.     ??? polyethylene glycol (MIRALAX) 17 gram packet Take 17 g by mouth daily.      ??? predniSONE (DELTASONE) 10 MG tablet      ??? prochlorperazine (COMPAZINE) 10 MG tablet Take 10 mg by mouth every eight (8) hours as needed.      ??? senna-docusate (PERICOLACE) 8.6-50 mg Take 1 tablet by mouth.     ??? sodium bicarbonate 650 mg tablet Take 650 mg by mouth.     ??? SUMAtriptan (IMITREX) 25 MG tablet      ??? tamsulosin (FLOMAX) 0.4 mg capsule Take 1 capsule (0.4 mg total) by mouth daily. 30 capsule 11   ??? triamcinolone (KENALOG) 0.1 % ointment Apply to arms twice a day until clear, then stop. (Patient taking differently: Apply 1 application topically two (2) times a day as needed. ) 30 g 1   ??? valACYclovir (VALTREX) 1000 MG tablet Take 1 tablet (1,000 mg total) by mouth daily. 30 tablet 0   ??? diphenhydrAMINE (BENADRYL) 25 mg tablet Take 25 mg by mouth every six (6) hours as needed.      ??? doxycycline (VIBRA-TABS) 100 MG tablet Take 1 tablet (100 mg total) by mouth daily. (Patient not taking: Reported on 06/04/2017) 30 tablet 0   ??? lactose-reduced food (ENSURE ENLIVE ORAL) Take 237 mL by mouth.       No current facility-administered medications for this visit.        Allergies: No Known Allergies    Family History:  Cancer-related family history includes Cancer in his brother. There is no history of Melanoma.  indicated that the status of his mother is unknown. He indicated that the status of his brother is unknown. He indicated that the status of his neg hx is unknown.         Lab Results   Component Value Date    CREATININE 2.12 (H) 05/29/2017     Lab Results   Component Value Date    ALKPHOS 408 (H) 05/29/2017    BILITOT 0.7 05/29/2017    PROT 7.1 05/21/2017    ALBUMIN 3.5 05/21/2017    ALT 193 (H) 05/29/2017    AST 135 (H) 05/29/2017        I personally spent over half of a total 25 minutes in counseling and discussion with the patient as described above.    Doreatha Martin, MD  Outpatient Oncology Palliative Care

## 2017-06-21 NOTE — Unmapped (Signed)
[facility] Refill Coordination Note  Specialty Medication(s): Xtandi 40mg     Bruce Parker, DOB: Oct 07, 1960  Phone: [phone] (home) , Alternate phone contact: N/A  Phone or address changes today?: No  All above HIPAA information was verified with patient's family member.  Shipping Address: [address]  [place] [address]   Insurance changes? No    Completed refill call assessment today to schedule patient's medication shipment from the [facility] [phone]).      Confirmed the medication and dosage are correct and have not changed: Yes, regimen is correct and unchanged.    Confirmed patient started or stopped the following medications in the past month:  No, there are no changes reported at this time.    Are you tolerating your medication?:  Bruce Parker reports tolerating the medication.    ADHERENCE    Is this medicine transplant or covered by Medicare Part B? No.        Did you miss any doses in the past 4 weeks? No missed doses reported.    FINANCIAL/SHIPPING    Delivery Scheduled: Yes, Expected medication delivery date: 06/25/17     Bruce Parker did not have any additional questions at this time.    Delivery address validated in [facility] scheduling system: Yes, address listed in [facility] is correct.    We will follow up with patient monthly for standard refill processing and delivery.      Thank you,  [employer]   [facility] Specialty Pharmacist

## 2017-07-02 ENCOUNTER — Ambulatory Visit
Admission: RE | Admit: 2017-07-02 | Discharge: 2017-07-02 | Disposition: A | Discharge status: Home / Self Care | Payer: MEDICARE | Attending: Hematology & Oncology | Admitting: Hematology & Oncology

## 2017-07-02 ENCOUNTER — Ambulatory Visit
Admission: RE | Admit: 2017-07-02 | Discharge: 2017-07-02 | Disposition: A | Discharge status: Home / Self Care | Payer: MEDICARE

## 2017-07-02 ENCOUNTER — Ambulatory Visit
Admission: RE | Admit: 2017-07-02 | Discharge: 2017-07-02 | Disposition: A | Discharge status: Home / Self Care | Payer: MEDICARE | Attending: Pharmacist Clinician (PhC)/ Clinical Pharmacy Specialist | Admitting: Pharmacist Clinician (PhC)/ Clinical Pharmacy Specialist

## 2017-07-02 DIAGNOSIS — C61 Malignant neoplasm of prostate: Principal | ICD-10-CM

## 2017-07-02 DIAGNOSIS — G893 Neoplasm related pain (acute) (chronic): Secondary | ICD-10-CM

## 2017-07-02 DIAGNOSIS — C7951 Secondary malignant neoplasm of bone: Secondary | ICD-10-CM

## 2017-07-02 DIAGNOSIS — C3490 Malignant neoplasm of unspecified part of unspecified bronchus or lung: Secondary | ICD-10-CM

## 2017-07-02 LAB — CBC W/ AUTO DIFF
BASOPHILS ABSOLUTE COUNT: 0 10*9/L (ref 0.0–0.1)
EOSINOPHILS ABSOLUTE COUNT: 0.1 10*9/L (ref 0.0–0.4)
HEMATOCRIT: 37.1 % — ABNORMAL LOW (ref 41.0–53.0)
HEMOGLOBIN: 12.1 g/dL — ABNORMAL LOW (ref 13.5–17.5)
LARGE UNSTAINED CELLS: 0 % (ref 0–4)
LYMPHOCYTES ABSOLUTE COUNT: 0.7 10*9/L — ABNORMAL LOW (ref 1.5–5.0)
MEAN CORPUSCULAR HEMOGLOBIN CONC: 32.5 g/dL (ref 31.0–37.0)
MEAN CORPUSCULAR VOLUME: 104.5 fL — ABNORMAL HIGH (ref 80.0–100.0)
MEAN PLATELET VOLUME: 8 fL (ref 7.0–10.0)
NEUTROPHILS ABSOLUTE COUNT: 12.7 10*9/L — ABNORMAL HIGH (ref 2.0–7.5)
RED BLOOD CELL COUNT: 3.55 10*12/L — ABNORMAL LOW (ref 4.50–5.90)
RED CELL DISTRIBUTION WIDTH: 20.2 % — ABNORMAL HIGH (ref 12.0–15.0)
WBC ADJUSTED: 13.8 10*9/L — ABNORMAL HIGH (ref 4.5–11.0)

## 2017-07-02 LAB — PROSTATE SPECIFIC ANTIGEN: Prostate specific Ag:MCnc:Pt:Ser/Plas:Qn:: 61.1 — ABNORMAL HIGH

## 2017-07-02 LAB — LYMPHOCYTES ABSOLUTE COUNT: Lab: 0.7 — ABNORMAL LOW

## 2017-07-02 LAB — COMPREHENSIVE METABOLIC PANEL
ALBUMIN: 3.4 g/dL — ABNORMAL LOW (ref 3.5–5.0)
ALKALINE PHOSPHATASE: 280 U/L — ABNORMAL HIGH (ref 38–126)
ALT (SGPT): 91 U/L — ABNORMAL HIGH (ref 19–72)
ANION GAP: 12 mmol/L (ref 9–15)
AST (SGOT): 57 U/L — ABNORMAL HIGH (ref 19–55)
BILIRUBIN TOTAL: 0.6 mg/dL (ref 0.0–1.2)
BLOOD UREA NITROGEN: 37 mg/dL — ABNORMAL HIGH (ref 7–21)
BUN / CREAT RATIO: 20
CALCIUM: 9 mg/dL (ref 8.5–10.2)
CHLORIDE: 106 mmol/L (ref 98–107)
CO2: 19 mmol/L — ABNORMAL LOW (ref 22.0–30.0)
CREATININE: 1.83 mg/dL — ABNORMAL HIGH (ref 0.70–1.30)
EGFR MDRD AF AMER: 46 mL/min/{1.73_m2} — ABNORMAL LOW (ref >=60)
GLUCOSE RANDOM: 122 mg/dL (ref 65–179)
POTASSIUM: 4.3 mmol/L (ref 3.5–5.0)
PROTEIN TOTAL: 6.3 g/dL — ABNORMAL LOW (ref 6.5–8.3)
SODIUM: 137 mmol/L (ref 135–145)

## 2017-07-02 LAB — PHOSPHORUS: Phosphate:MCnc:Pt:Ser/Plas:Qn:: 3.4

## 2017-07-02 LAB — SLIDE REVIEW

## 2017-07-02 LAB — BURR CELLS

## 2017-07-02 LAB — ALT (SGPT): Alanine aminotransferase:CCnc:Pt:Ser/Plas:Qn:: 91 — ABNORMAL HIGH

## 2017-07-02 MED ORDER — OXYCODONE ER 40 MG TABLET,CRUSH RESISTANT,EXTENDED RELEASE 12 HR: 40 mg | tablet | Freq: Two times a day (BID) | 0 refills | 0 days | Status: AC

## 2017-07-02 MED ORDER — OXYCODONE-ACETAMINOPHEN 10 MG-325 MG TABLET: tablet | 0 refills | 0 days | Status: AC

## 2017-07-02 MED ORDER — OXYCODONE ER 40 MG TABLET,CRUSH RESISTANT,EXTENDED RELEASE 12 HR
ORAL_TABLET | Freq: Two times a day (BID) | ORAL | 0 refills | 0.00000 days | Status: CP | PRN
Start: 2017-07-02 — End: 2017-07-02

## 2017-07-02 MED ORDER — NALOXONE 4 MG/ACTUATION NASAL SPRAY
0 refills | 0.00000 days | Status: CP
Start: 2017-07-02 — End: 2017-07-02

## 2017-07-02 MED ORDER — OXYCODONE-ACETAMINOPHEN 10 MG-325 MG TABLET
ORAL_TABLET | ORAL | 0 refills | 0.00000 days | Status: CP | PRN
Start: 2017-07-02 — End: 2017-07-09

## 2017-07-02 MED ORDER — NALOXONE 4 MG/ACTUATION NASAL SPRAY: each | 0 refills | 0 days | Status: AC

## 2017-07-02 MED FILL — XTANDI/40MG/CAPS: XTANDI/40MG/CAPS | 30 days supply | Qty: 120 | Fill #6

## 2017-07-02 NOTE — Unmapped (Addendum)
Lab Results   Component Value Date    PSA 61.10 (H) 07/02/2017    PSA 60.50 (H) 05/21/2017    PSA 64.20 (H) 04/23/2017    PSA 50.70 (H) 03/26/2017    PSA 67.60 (H) 02/21/2017    PSA 60.80 (H) 01/10/2017   Today's PSA is pending.  However, PSA's since starting Radium 223 on February 21, 2017 have generally been stable.  Also, the liver function tests have become almost normal (but still minimally elevated).    Please call [phone] to reach my nurse navigator Mauricia Area for any issues.    For emergencies on Nights, Weekends and Holidays  Call [phone] and ask for the hematology/oncology on call.    Griffin Basil, MD, PhD  Associate Professor of Medicine  Division of Hematology-Oncology    [facility]  Genitourinary Oncology Clinic  Nurse Navigator: Mauricia Area  Fax: [phone]

## 2017-07-02 NOTE — Unmapped (Signed)
OUTPATIENT ONCOLOGY PALLIATIVE CARE    Principal Diagnosis: Bruce Parker is a 56 y/o M with unfortunately two known metastatic cancers including metastatic lung cancer dx 2/16 on nivolumab and metastatic prostate cancer dx 12/16 with known bony dx and T10 pathologic fx castrate resistant now on Radium 223 and enzalutamide who is referred to Palliative Care Clinic for help with ongoing pain/symptom management and GOC discussion. Recently his PSA has increased from 50.7 to 64.2 and prostate cancer physician believes he has prognosis of 1 month to 1 year but continuing radium 223 infusion, enzalutamide and lupron for now.       Assessment/Plan:   1.  Malignant pain: primarily lower back, improving, most likely associated with metastatic prostate cancer. Pain continues to worsen and patient has been self-adjusting medications, thus running out early. Had a very frank conversation with patient and his brother-in-law about the risks of opioids, including over-dose and death. Discussed that our team needs to be aware of any uncontrolled pain and we need to adjust medications in a safe and methodical manner. Also discussed the rationale for prescribing oxy/APAP 10/325mg  tablets instead of 5/325mg  tablets because of APAP content. Discussed that due to risk of hepatotoxicity, will not switch back to 5/325mg  tablets as it is unlikely his pain will be controlled with a safe amount. He verbalizes understanding. Per his estimation, he is taking Oxycontin 60 mg BID and oxy/APAP as 10mg /325 - 4 tabs BID. He is clearly in pain, but there is concern about reliability of patient report, thus will cautiously titrate opioids with short-interval follow-up. Narcan prescription provided and BIL educated   - Increase Oxycontin to 40 mg po BID   - Continue oxycodone/APAP 10/325 - 1-2 tabs q4h prn. Max of 8 tabs/day. Instructed patient to not go above 2 tablet dose    2. Goals of Care/Advanced Care Planning  Previously Discussed with [name] out role in support him during this journey. He expressed sadness about his prognosis but also hope that he actually has many years left. His goals right now are to live as long as he can, but also enjoy the little things like visiting with family, sitting in the outdoors and staying independent as much as he can. He would like his sister, Bruce Parker, to be his HCPOA but has not formally filled out the paperwork yet. He does not want to be a burden on his family and if he becomes dependent for his ADLs he would like to go to a nursing home as he does not want them to have to take care of him. When he dies he would like to go peacefully in his sleep if possible. He has a strong faith and believes Jesus gives him only what he can handle and knows when it is his time. Does not have any formal Ads in place.        Plan:   -- continue to discuss GOC at each visit, today we mostly explored his current feelings and supported his hopes for the future. However I do worry his time is shorter than he would like and will discuss further GOC as we get to know him better   -- refer to patient relations for completion of formal Ads, would like sister Bruce Parker to be HCPOA    3. Controlled substances risk management.   - Patient has a signed pain medication agreement with Outpatient Palliative Care, completed on 05/21/17, as per standard of care.   - [place] database was reviewed today and  it was not appropriate. Patient had Rx for oxy IR provided by oncologist in [place]. Discussed the importance of having a single prescriber of opioids   - Urine drug screen was performed 05/21/17. Findings: positive for oxymorphone only (metabolite of oxycodone)   - Patient has received information about safe storage and administration of medications.   - Patient has received a prescription for narcan; Brother-in-law was educated on its use    F/u: 1 weeks, same date as Radium infusion  ----------------------------------------  Referring Oncology Provider: Dr. Philomena Course (Prostate Cancer provider) and Dr. Sofie Hartigan (lung cancer provider)  PCP: Lonie Peak, [facility]      HPI:  Bruce Parker is a 56 y/o M with unfortunately two known metastatic cancers including metastatic lung cancer dx 2/16 on nivolumab and metastatic prostate cancer dx 12/16 with known bony dx and T10 pathologic fx castrate resistant now on Radium 223 and enzalutamide who is referred to Palliative Care Clinic for help with ongoing pain/symptom management and GOC discussion. Recently his PSA has increased from 50.7 to 64.2 and prostate cancer physician believes he has prognosis of 1 month to 1 year but continuing radium 223 infusion, enzalutamide and lupron    Current cancer-directed therapy: Currently receiving Q2 week nivolumab (s/p cycle #48) , radium 223, lupron and enzalutamide    Interval history, 07/02/17, MDK: here today with brother-in-law  - Pain primarily lower back, radiates down bilateral legs. Pain significantly worsened from prior visit. Ran out of oxy/APAP and was given oxy IR 5 mg Rx from oncologist in [place]. It is unclear how he is taking opioids, but her reports he has been completely out of both Oxycontin and oxy IR for about 3-4 days. Denies s/sx of withdrawal. Patient estimates that his oxy/APAP use was 10/325mg  tabs - 3-4 tabs BID. Also admits to increasing Oxycontin to 40-60mg  per dose, taking BID. Denies adverse effects, somnolence.   - Bowels are moving regularly with colace daily and miralax prn    Palliative Performance Scale: 60% - Ambulation: Reduced / unable to do hobby or some housework, significant disease / Self-Care:Occasional assist as necessary / Intake:Normal or reduced / Level of Conscious: Full or confusion    Coping/Support Issues:   Denies any support or coping issues. Has strong family, friend and church support    Goals of Care:   [name] reports that his main goal is to live as long as he can. He would also like to live to next summer to enjoy the simple things in life including the fresh air, watching cars go by and taking care of himself. If he were to become to sick to take care of himself he would like to go to a nursing home so he would not be a burden for his family. If his time comes he would like to go in his sleep. He does not want surgery but otherwise is ok to continue aggressive treatments at this time, no formal Ads completed or in place    Social History:   Mr. Vanessen lives in [place] alone. However his sister Bruce Parker and [place] Darryl are actively involved in his care and check on him every day. His sister does most of his medications and his BIL takes him to all of his appointments. He uses a walker to get around, able to do his own ADLs but gets help with cooking, laundry and does not drive. He uses [employer] 24/7. He used to [employer], also worked as a Nurse, adult. Denies  EtOH, previous smoker but quit, denies drugs.    Advance Care Planning:   Does not have any formal ADs in place. Would like his sister, Bruce Parker, to be his formal health care decision maker but has not completed the paperwork for his.   HCPOA: none  Natural surrogate decision maker: sister  Living Will: none  ACP note: none    Objective       Oncology History    --In 04/2014, PSA elevated to 56.4. DRE normal.  Prostate bx scheduled, but canceled by pt.  --In 09/2014, diagnosed with nonsmall cell lung cancer, stage IV (T2b, N2, M1b) with a large R hilar mass, mediastinal adenopathy, mets to bones. No uptake in liver or pancreas on PET scan. Insufficient material for molecular testing.  Guardant360 for ctDNA was negative.  Pt was subsequently treated with carboplatin/Alimta with initial good response.  Then disease progression.  On second line therapy with nivolumab.  --In 01/2015, PSA 62.2.  Seen in Urology clinic and referred to [facility] for possibility of prostate cancer.  After discussions about various options, underwent bone bx in 05/2015, nondiagnostic. PSA down to 23.7  --In 07/2015, prostate bx showed Gleason 4+5=9 adenocarcinoma.  --In 08/2015, androgen deprivation therapy with Lupron started.  PSA 354.  --In 11/2015, Lupron continued. PSA down to 1.7, which was the nadir. Subsequently, PSA began rising.  --In 06/2016, PSA up to 32.  Enzalutamide started.  MRI of brain neg. Bone scan stable.  --In 02/2017, PSA rise on enzalutamide. Bone scan stable. Radium 223 added        Lung cancer, primary, with metastasis from lung to other site (CMS-HCC) (Resolved)    10/11/2014 Initial Diagnosis     Lung cancer, primary, with metastasis from lung to other site [facility])           Prostate cancer (CMS-HCC)    07/27/2015 Initial Diagnosis     Prostate cancer (RAF-HCC)            Patient Active Problem List   Diagnosis   ??? Derangement of right patella   ??? Hypertension, benign   ??? Osteoarthrosis   ??? Chronic renal disease, stage 3, moderately decreased glomerular filtration rate (GFR) between 30-59 mL/min/1.73 square meter (CMS-HCC)   ??? Lichen planus   ??? Weakness   ??? Lung mass   ??? Mediastinal lymphadenopathy   ??? Malignant neoplasm metastatic to bone (CMS-HCC)   ??? Pain of metastatic malignancy   ??? Non-small cell carcinoma of lung (CMS-HCC)   ??? Protein-calorie malnutrition (CMS-HCC)   ??? Acute respiratory failure (CMS-HCC)   ??? Shortness of breath   ??? Tobacco dependence syndrome   ??? Prostate cancer (CMS-HCC)       Past Medical History:   Diagnosis Date   ??? Lung cancer (CMS-HCC)    ??? On supplemental oxygen therapy        Past Surgical History:   Procedure Laterality Date   ??? APPENDECTOMY     ??? PR BRNCHSC EBUS GUIDED SAMPL 3/> NODE STATION/STRUX N/A 05/18/2015    Procedure: Bronch, Rigid Or Flexible, Including Fluoro Guidance, When Performed; W Ebus Guided Transtracheal And/Or Transbronchial Sampling, 3 Or More Mediastinal And/Or Hilar Lymph Node Stations Or Structures;  Surgeon: Mathis Bud, MD;  Location: MAIN OR [facility];  Service: Pulmonary   ??? small intestines removed per pt         Current Outpatient Prescriptions   Medication Sig Dispense Refill   ??? albuterol 2.5 mg /3 mL (  0.083 %) nebulizer solution Inhale 2.5 mg by nebulization every four (4) hours as needed.      ??? AMITIZA 24 mcg capsule Take 24 mcg by mouth daily with breakfast.      ??? atorvastatin (LIPITOR) 40 MG tablet      ??? betamethasone dipropionate (DIPROLENE) 0.05 % ointment Apply twice daily to affected areas as needed for itching 45 g 2   ??? diphenhydrAMINE (BENADRYL) 25 mg tablet Take 25 mg by mouth every six (6) hours as needed.      ??? doxycycline (VIBRA-TABS) 100 MG tablet Take 1 tablet (100 mg total) by mouth daily. (Patient not taking: Reported on 06/04/2017) 30 tablet 0   ??? enzalutamide (XTANDI) 40 mg cap capsule Take 4 capsules (160 mg total) by mouth daily. 120 capsule 11   ??? fluocinolone 0.01 % external oil Bid to back 120 mL 2   ??? folic acid (FOLVITE) 1 MG tablet Take 1 mg by mouth.     ??? food supplemt, lactose-reduced (ENSURE PLUS) 0.05-1.5 gram-kcal/mL liquid Take 237 mL by mouth daily.      ??? furosemide (LASIX) 20 MG tablet Take 20 mg by mouth.     ??? lactose-reduced food (ENSURE ENLIVE ORAL) Take 237 mL by mouth.     ??? levocetirizine (XYZAL) 5 MG tablet Take 5 mg by mouth.     ??? lidocaine-prilocaine (EMLA) cream Place on port site the morning of chemo     ??? magnesium oxide (MAG-OX) 400 mg tablet Take 400 mg by mouth.     ??? metoprolol tartrate (LOPRESSOR) 25 MG tablet Take 12.5 mg by mouth.     ??? mirtazapine (REMERON) 15 MG tablet Take 15 mg by mouth nightly.      ??? naloxone (NARCAN) 4 mg nasal spray One spray in either nostril once for known/suspected opioid overdose. May repeat every 2-3 minutes in alternating nostril til EMS arrives 1 each 0   ??? oxyCODONE (OXYCONTIN) 40 mg 12 hr crush resistant tablet Take 1 tablet (40 mg total) by mouth two (2) times a day as needed for pain. 30 tablet 0   ??? oxyCODONE-acetaminophen (PERCOCET) 10-325 mg per tablet Take 1-2 tablets by mouth every four (4) hours as needed for pain. 120 tablet 0   ??? pantoprazole (PROTONIX) 40 MG tablet Take 40 mg by mouth.     ??? polyethylene glycol (MIRALAX) 17 gram packet Take 17 g by mouth daily.      ??? predniSONE (DELTASONE) 10 MG tablet      ??? prochlorperazine (COMPAZINE) 10 MG tablet Take 10 mg by mouth every eight (8) hours as needed.      ??? senna-docusate (PERICOLACE) 8.6-50 mg Take 1 tablet by mouth.     ??? sodium bicarbonate 650 mg tablet Take 650 mg by mouth.     ??? SUMAtriptan (IMITREX) 25 MG tablet      ??? tamsulosin (FLOMAX) 0.4 mg capsule Take 1 capsule (0.4 mg total) by mouth daily. 30 capsule 11   ??? triamcinolone (KENALOG) 0.1 % ointment Apply to arms twice a day until clear, then stop. (Patient taking differently: Apply 1 application topically two (2) times a day as needed. ) 30 g 1   ??? valACYclovir (VALTREX) 1000 MG tablet Take 1 tablet (1,000 mg total) by mouth daily. 30 tablet 0     No current facility-administered medications for this visit.        Allergies: No Known Allergies    Family History:  Cancer-related family  history includes Cancer in his brother. There is no history of Melanoma.  indicated that the status of his mother is unknown. He indicated that the status of his brother is unknown. He indicated that the status of his neg hx is unknown.         Lab Results   Component Value Date    CREATININE 1.83 (H) 07/02/2017     Lab Results   Component Value Date    ALKPHOS 280 (H) 07/02/2017    BILITOT 0.6 07/02/2017    PROT 6.3 (L) 07/02/2017    ALBUMIN 3.4 (L) 07/02/2017    ALT 91 (H) 07/02/2017    AST 57 (H) 07/02/2017        I personally spent over half of a total 45 minutes in counseling and discussion with the patient as described above.    Jenkins Rouge, PharmD, BCOP, CPP  Outpatient Oncology Palliative Care

## 2017-07-02 NOTE — Unmapped (Signed)
Lab drawn and sent for analysis.

## 2017-07-02 NOTE — Unmapped (Signed)
Clinical Pharmacist Practitioner: [facility]    Oral Agent Follow-Up    Assessment and recommendations:  1. Enzalutamide monitoring and side effect management- tolerating well although he has been underdosing for a few months, taking 120 mg vs 160 mg that was prescribed. Also had a delay from [facility] pharmacy this month.   - Enzalutamide will arrive to patients home tomorrow.  - Resume 160 mg daily take 4 capsules once daily at noon.      Follow- up: Will reassess adherence at next clinic visit in one month.     ______________________________________________________________________    Oral Agent: Enzalutamide 160 mg daily       Start date: 11/17    HPI:  [name] is a 56 yo man with metastatic castration resistant prostate cancer, with bone metastases currently on first line treatment with lupron, enzalutamide, and Radium 223 x 6. He also has Stage IV nonsmall cell lung cancer, on secondline therapy with nivolumab s/p cycle #48 (q2w)    Interim History:   With regards to his enzalutamide [name] is doing quite well. He has no complaints or side effects he is aware of. He reports minimal fatigue. His blood pressures are well controlled. He is in pain and is being seen by palliative care today for management.    Adherence: [name] sister manages his medications and he says for the past few months he has only been taking 3 capsules per day (120 mg), although he should be taking 4 capsules for a total dose of 160 mg daily. He takes his xtandi everyday at noon. He has not missed any doses until recently. He was supposed to receive a new shipment last Tuesday but it never arrived.     Toxicities: none to report from xtandi    Medications:  Current Outpatient Prescriptions   Medication Sig Dispense Refill   ??? albuterol 2.5 mg /3 mL (0.083 %) nebulizer solution Inhale 2.5 mg by nebulization every four (4) hours as needed.      ??? AMITIZA 24 mcg capsule Take 24 mcg by mouth daily with breakfast.      ??? atorvastatin (LIPITOR) 40 MG tablet      ??? betamethasone dipropionate (DIPROLENE) 0.05 % ointment Apply twice daily to affected areas as needed for itching 45 g 2   ??? diphenhydrAMINE (BENADRYL) 25 mg tablet Take 25 mg by mouth every six (6) hours as needed.      ??? enzalutamide (XTANDI) 40 mg cap capsule Take 4 capsules (160 mg total) by mouth daily. 120 capsule 11   ??? fluocinolone 0.01 % external oil Bid to back 120 mL 2   ??? folic acid (FOLVITE) 1 MG tablet Take 1 mg by mouth.     ??? food supplemt, lactose-reduced (ENSURE PLUS) 0.05-1.5 gram-kcal/mL liquid Take 237 mL by mouth daily.      ??? furosemide (LASIX) 20 MG tablet Take 20 mg by mouth.     ??? lactose-reduced food (ENSURE ENLIVE ORAL) Take 237 mL by mouth.     ??? levocetirizine (XYZAL) 5 MG tablet Take 5 mg by mouth.     ??? lidocaine-prilocaine (EMLA) cream Place on port site the morning of chemo     ??? magnesium oxide (MAG-OX) 400 mg tablet Take 400 mg by mouth.     ??? metoprolol tartrate (LOPRESSOR) 25 MG tablet Take 12.5 mg by mouth.     ??? mirtazapine (REMERON) 15 MG tablet Take 15 mg by mouth nightly.      ???  pantoprazole (PROTONIX) 40 MG tablet Take 40 mg by mouth.     ??? polyethylene glycol (MIRALAX) 17 gram packet Take 17 g by mouth daily.      ??? predniSONE (DELTASONE) 10 MG tablet      ??? prochlorperazine (COMPAZINE) 10 MG tablet Take 10 mg by mouth every eight (8) hours as needed.      ??? senna-docusate (PERICOLACE) 8.6-50 mg Take 1 tablet by mouth.     ??? sodium bicarbonate 650 mg tablet Take 650 mg by mouth.     ??? SUMAtriptan (IMITREX) 25 MG tablet      ??? tamsulosin (FLOMAX) 0.4 mg capsule Take 1 capsule (0.4 mg total) by mouth daily. 30 capsule 11   ??? triamcinolone (KENALOG) 0.1 % ointment Apply to arms twice a day until clear, then stop. (Patient taking differently: Apply 1 application topically two (2) times a day as needed. ) 30 g 1   ??? valACYclovir (VALTREX) 1000 MG tablet Take 1 tablet (1,000 mg total) by mouth daily. 30 tablet 0   ??? doxycycline (VIBRA-TABS) 100 MG tablet Take 1 tablet (100 mg total) by mouth daily. (Patient not taking: Reported on 06/04/2017) 30 tablet 0   ??? naloxone (NARCAN) 4 mg nasal spray One spray in either nostril once for known/suspected opioid overdose. May repeat every 2-3 minutes in alternating nostril til EMS arrives 1 each 0   ??? oxyCODONE (OXYCONTIN) 40 mg 12 hr crush resistant tablet Take 1 tablet (40 mg total) by mouth two (2) times a day as needed for pain. 30 tablet 0   ??? oxyCODONE-acetaminophen (PERCOCET) 10-325 mg per tablet Take 1-2 tablets by mouth every four (4) hours as needed for pain. 120 tablet 0     No current facility-administered medications for this visit.        I spent 10 minutes with Mr.Lindfors in direct patient care.     Laverna Peace PharmD, BCOP, CPP  Hematology/Oncology Pharmacist  P: [phone]

## 2017-07-02 NOTE — Unmapped (Signed)
I returned call to [name].     He needs insurance approval for oxycodone.    Pharmacy phone number is [phone]  [name] [facility] [place])

## 2017-07-02 NOTE — Unmapped (Signed)
Patient needs to speak with Laurene Footman regarding his prescriptions.      Thanks in advance,  Bruce Parker  [facility]  [phone]

## 2017-07-02 NOTE — Unmapped (Signed)
1. Increase Oxycontin (the 12-hour medicine) to 40 mg twice daily. DO NOT make changes to the dose of this medicine.  2. Use percocet as needed. You can use 1 tablet for mild pain or 2 tablets for severe pain. Use a maximum of 8 tablets per day. Using more of this medicine could cause liver problems    Here is some additional information about [facility] Outpatient Oncology Palliative Care:  ??  What is Palliative Care?  Palliative Care is medical care that focuses on managing symptoms of cancer or side effects from cancer treatment. The Outpatient Oncology Palliative Care service uses a team approach to help patients and their caregivers with the goal of maintaining the best quality of life possible during a cancer diagnosis and cancer treatment.   The Outpatient Oncology Palliative Care team serves all outpatient oncology clinics including surgery, gynecology, medicine and radiation oncology.   ??  Who provides Outpatient Palliative Care?  The Outpatient Oncology Palliative Care team includes doctors, nurses, pharmacists, social workers, counselors and nutritionists. The team works with a patient's primary oncology team to create care plans that can help manage symptoms related to cancer and cancer treatment.  ??  What services are offered by Outpatient Oncology Palliative Care?  We can help cancer patients with:  ?? Pain  ?? Symptoms such as nausea, vomiting, anxiety, depression, fatigue, loss of appetite of shortness of breath  ?? Emotional, psychological, or spiritual suffering  ?? Nutritional problems  ?? Concern about medical decisions  ?? Difficulty communicating values, goals and personal choices.  ??  Who are the Outpatient Oncology Palliative Care team members?  Physicians: Doreatha Martin, MD and  Kathlynn Grate, MD  Physician Fellows:  Tera Partridge, MD; Franchot Mimes, MD; Vertell Limber, MD  Clinical Pharmacist Practitioner:  Laurene Footman, PharmD, CPP  Nurse Practitioner: Griselda Miner, RN, MSN, FNP-BC,  Nurse Clinical Coordinator: Allegra Lai, RN, BSN, MS, OCN  Administrative Specialist: Dolores Frame  ??  When can I see the Outpatient Oncology Palliative Care team and how do I make contact in between visits?  We see patients Monday through Friday 8am - 4:30pm. If you have questions or concerns during clinic hours, please contact us. Any requests made outside of business hours will be addressed the following business day.   ??  For appointments & questions Monday through Friday 8 AM??? 4:30 PM:  please call [phone] or Toll free [phone].  ??  On Nights, Weekends and Holidays:  Call [phone] and ask for the Oncologist on call.

## 2017-07-02 NOTE — Unmapped (Addendum)
[facility] Visit Note    Patient Name: Bruce Parker  Patient Age: 56 y.o.  Encounter Date: 07/02/2017  Provider: Maurie Parker  Referring physician: Maurie Boettcher, MD    Assessment  Patient Active Problem List   Diagnosis   ??? Derangement of right patella   ??? Hypertension, benign   ??? Osteoarthrosis   ??? Chronic renal disease, stage 3, moderately decreased glomerular filtration rate (GFR) between 30-59 mL/min/1.73 square meter (CMS-HCC)   ??? Lichen planus   ??? Weakness   ??? Lung mass   ??? Mediastinal lymphadenopathy   ??? Malignant neoplasm metastatic to bone (CMS-HCC)   ??? Pain of metastatic malignancy   ??? Non-small cell carcinoma of lung (CMS-HCC)   ??? Protein-calorie malnutrition (CMS-HCC)   ??? Acute respiratory failure (CMS-HCC)   ??? Shortness of breath   ??? Tobacco dependence syndrome   ??? Prostate cancer (CMS-HCC)     1. Metastatic castration resistant prostate cancer, with bone metastases  2. Stage IV nonsmall cell lung cancer, on secondline therapy with nivolumab s/p cycle #48 (q2w)    On CRPC therapy with enzalutamide.  Radium-223 infusion added for poor PSA response to enzalutamide.  It seems that after Radium 223 infusion was started, PSA stabilized and is not increasing anymore.  Today's PSA confirms this.    Pt's increasing pain is of concern. MRI of spine done in 03/2017 did not show any epidural disease, just vertebral osseous mets and compression fracture.  Therefore, with PSA being stable, I believe that pt is not at high risk of having spinal cord compression that's newly developed since MRI of 03/2017.  It's likely pain represents symptoms of metastatic disease.  In terms of treatment options, I think his options are limited.  If disease progression is documented by multiple new lesions on bone scan or clearly progressive soft tissue disease on CT san while PSA is stable, I would be concerned about neuroendocrine tumor, which would require aggressive platinum-based chemo that this pt can't tolerate.  Even if he had progressive adenocarcinoma, the only effective therapy would then be taxane chemo, which pt would have difficulty tolerating.  Another agent like abiraterone would not be effective.  I think it's best that we do palliative therapy, with continuing with Radium 223, vs palliative RT to the painful bone mets.    Plan  1. Pt should proceed with Radium 223, #5 out of 6 planned infusions.  We'll need to contact nuclear medicine and make arrangement for this next week.  -- Would consider palliative RT to the spine in the near future, especially if pain control is inadequate. Would prefer to wait until after 6th infusion of Radium 223.  2. Continue enzalutamide dose to 160 mg qd (regular dose), since he is tolerating well.  Follow PSA.  -- After 6 infusions of Radium 223 are completed, consider repeating staging scans and if progressive disease, pt should be considered for hospice referral.  3. Lupron 22.5 mg IM, given today, 04/23/17, next due 07/23/17 or later  4. I discussed with palliative care.  Pt's narcotics being adjusted by palliative care.  I think one complicating factor is that another oncology provider team is also making adjustments to his regimen.  5. Elevated LFT's being managed by Bruce Parker.  In reviewing CareEverywhere, pt was screened for hepatitis.  Nivo being held. On prednisone.  6. Pt's caregiver brought up the fact that Bruce Parker is closer to pt than Bruce Parker and I think  it is a good idea for his lung cancer care to be transitioned to [facility].  I asked pt to consider this and we'll make a referral to [facility] thoracic oncology. Pt had seen Dr. Eloise Parker in the past at [facility]. Pt is doing well from the lung cancer standpoint and had been on nivo long term.  7. Return for Radium 223 infusion next week, then in time for the subsequent Radium 223 infusion.    I personally spent over half of a total 40 minutes face to face with the patient in counseling and discussion and/or coordination of care as described above. Bruce Boettcher, MD        Reason for Visit  Prostate cancer    History of Present Illness:  Oncology History    --In 04/2014, PSA elevated to 56.4. DRE normal.  Prostate bx scheduled, but canceled by pt.  --In 09/2014, diagnosed with nonsmall cell lung cancer, stage IV (T2b, N2, M1b) with a large R hilar mass, mediastinal adenopathy, mets to bones. No uptake in liver or pancreas on PET scan. Insufficient material for molecular testing.  Guardant360 for ctDNA was negative.  Pt was subsequently treated with carboplatin/Alimta with initial good response.  Then disease progression.  On second line therapy with nivolumab.  --In 01/2015, PSA 62.2.  Seen in Urology clinic and referred to [facility] for possibility of prostate cancer.  After discussions about various options, underwent bone bx in 05/2015, nondiagnostic. PSA down to 23.7  --In 07/2015, prostate bx showed Gleason 4+5=9 adenocarcinoma.  --In 08/2015, androgen deprivation therapy with Lupron started.  PSA 354.  --In 11/2015, Lupron continued. PSA down to 1.7, which was the nadir. Subsequently, PSA began rising.  --In 06/2016, PSA up to 32.  Enzalutamide started.  MRI of brain neg. Bone scan stable.  --In 02/2017, PSA rise on enzalutamide. Bone scan stable. Radium 223 added        Lung cancer, primary, with metastasis from lung to other site (CMS-HCC) (Resolved)    10/11/2014 Initial Diagnosis     Lung cancer, primary, with metastasis from lung to other site [facility])           Prostate cancer (CMS-HCC)    07/27/2015 Initial Diagnosis     Prostate cancer (RAF-HCC)          The patient returns for scheduled follow up.  He's accompanied by his brother-in-law as usual.  Pt was scheduled to see me and to have the next infusion of Radium 223 on 10/30, two weeks ago and was a no-show.  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  He is supposed to return next week for consideration of resuming nivolumab, if his LFT's are normalized.  Today, pt notes that he is in severe pain.  He has been switched from percocet to oxycodone and he thinks it doesn't work as well.  He has used up his oxycodone and has been out of it for several days.  When he's asked specifically, he notes more of low back pain.    Allergies:  No Known Allergies    Current Medications:    Current Outpatient Prescriptions:   ???  albuterol 2.5 mg /3 mL (0.083 %) nebulizer solution, Inhale 2.5 mg by nebulization every four (4) hours as needed. , Disp: , Rfl:   ???  AMITIZA 24 mcg  capsule, Take 24 mcg by mouth daily with breakfast. , Disp: , Rfl:   ???  atorvastatin (LIPITOR) 40 MG tablet, , Disp: , Rfl:   ???  betamethasone dipropionate (DIPROLENE) 0.05 % ointment, Apply twice daily to affected areas as needed for itching, Disp: 45 g, Rfl: 2  ???  diphenhydrAMINE (BENADRYL) 25 mg tablet, Take 25 mg by mouth every six (6) hours as needed. , Disp: , Rfl:   ???  enzalutamide (XTANDI) 40 mg cap capsule, Take 4 capsules (160 mg total) by mouth daily., Disp: 120 capsule, Rfl: 11  ???  fluocinolone 0.01 % external oil, Bid to back, Disp: 120 mL, Rfl: 2  ???  folic acid (FOLVITE) 1 MG tablet, Take 1 mg by mouth., Disp: , Rfl:   ???  food supplemt, lactose-reduced (ENSURE PLUS) 0.05-1.5 gram-kcal/mL liquid, Take 237 mL by mouth daily. , Disp: , Rfl:   ???  furosemide (LASIX) 20 MG tablet, Take 20 mg by mouth., Disp: , Rfl:   ???  lactose-reduced food (ENSURE ENLIVE ORAL), Take 237 mL by mouth., Disp: , Rfl:   ???  levocetirizine (XYZAL) 5 MG tablet, Take 5 mg by mouth., Disp: , Rfl:   ???  lidocaine-prilocaine (EMLA) cream, Place on port site the morning of chemo, Disp: , Rfl:   ???  magnesium oxide (MAG-OX) 400 mg tablet, Take 400 mg by mouth., Disp: , Rfl:   ???  metoprolol tartrate (LOPRESSOR) 25 MG tablet, Take 12.5 mg by mouth., Disp: , Rfl:   ??? mirtazapine (REMERON) 15 MG tablet, Take 15 mg by mouth nightly. , Disp: , Rfl:   ???  pantoprazole (PROTONIX) 40 MG tablet, Take 40 mg by mouth., Disp: , Rfl:   ???  polyethylene glycol (MIRALAX) 17 gram packet, Take 17 g by mouth daily. , Disp: , Rfl:   ???  predniSONE (DELTASONE) 10 MG tablet, , Disp: , Rfl:   ???  prochlorperazine (COMPAZINE) 10 MG tablet, Take 10 mg by mouth every eight (8) hours as needed. , Disp: , Rfl:   ???  senna-docusate (PERICOLACE) 8.6-50 mg, Take 1 tablet by mouth., Disp: , Rfl:   ???  sodium bicarbonate 650 mg tablet, Take 650 mg by mouth., Disp: , Rfl:   ???  SUMAtriptan (IMITREX) 25 MG tablet, , Disp: , Rfl:   ???  tamsulosin (FLOMAX) 0.4 mg capsule, Take 1 capsule (0.4 mg total) by mouth daily., Disp: 30 capsule, Rfl: 11  ???  triamcinolone (KENALOG) 0.1 % ointment, Apply to arms twice a day until clear, then stop. (Patient taking differently: Apply 1 application topically two (2) times a day as needed. ), Disp: 30 g, Rfl: 1  ???  valACYclovir (VALTREX) 1000 MG tablet, Take 1 tablet (1,000 mg total) by mouth daily., Disp: 30 tablet, Rfl: 0  ???  doxycycline (VIBRA-TABS) 100 MG tablet, Take 1 tablet (100 mg total) by mouth daily. (Patient not taking: Reported on 06/04/2017), Disp: 30 tablet, Rfl: 0  ???  naloxone (NARCAN) 4 mg nasal spray, One spray in either nostril once for known/suspected opioid overdose. May repeat every 2-3 minutes in alternating nostril til EMS arrives, Disp: 1 each, Rfl: 0  ???  oxyCODONE (OXYCONTIN) 40 mg 12 hr crush resistant tablet, Take 1 tablet (40 mg total) by mouth two (2) times a day as needed for pain., Disp: 30 tablet, Rfl: 0  ???  oxyCODONE-acetaminophen (PERCOCET) 10-325 mg per tablet, Take 1-2 tablets by mouth every four (4) hours as needed for pain., Disp: 120  tablet, Rfl: 0    Past Medical History and Social History  Past Medical History:   Diagnosis Date   ??? Lung cancer (CMS-HCC)    ??? On supplemental oxygen therapy       Past Surgical History:   Procedure Laterality Date ??? APPENDECTOMY     ??? PR BRNCHSC EBUS GUIDED SAMPL 3/> NODE STATION/STRUX N/A 05/18/2015    Procedure: Bronch, Rigid Or Flexible, Including Fluoro Guidance, When Performed; W Ebus Guided Transtracheal And/Or Transbronchial Sampling, 3 Or More Mediastinal And/Or Hilar Lymph Node Stations Or Structures;  Surgeon: Mathis Bud, MD;  Location: MAIN OR [facility];  Service: Pulmonary   ??? small intestines removed per pt          Social History     Occupational History   ??? Not on file.     Social History Main Topics   ??? Smoking status: Former Smoker     Packs/day: 1.00     Years: 30.00     Types: Cigarettes     Quit date: 05/15/2014   ??? Smokeless tobacco: Never Used   ??? Alcohol use No   ??? Drug use: No   ??? Sexual activity: Not on file   Pt is accompanied by his ?brother-in-law, who usually brings him to clinic    Family History  Brother had lung cancer.    Review of Systems:  A comprehensive review of 10 systems was negative except for pertinent positives noted in HPI.    Physical Exam:    VITAL SIGNS:  BP 133/85  - Pulse 95  - Temp 36.6 ??C (97.9 ??F) (Oral)  - Resp 16  - Wt 82.5 kg (181 lb 14.4 oz)  - SpO2 97%  - BMI 26.85 kg/m??   ECOG Performance Status: 2  GENERAL: Chronically ill-appearing, difficulty sitting up straight  HEAD: Normocephalic and atraumatic.  EYES: Conjunctivae are normal. No scleral icterus.  MOUTH/THROAT: Oropharynx is clear and moist.  No mucosal lesions.  NECK: Supple, no thyromegaly.  LYMPHATICS: No palpable cervical, supraclavicular, or axillary adenopathy.  CARDIOVASCULAR: Normal rate, regular rhythm and normal heart sounds.  Exam reveals no gallop and no friction rub.  No murmur heard.  PULMONARY/CHEST: Posterior inferior bibasilar crackles  ABDOMINAL:  Soft. There is no distension. There is no tenderness. There is no rebound and no guarding.  MUSCULOSKELETAL: No clubbing, cyanosis, or lower extremity edema.    PSYCHIATRIC: Alert and oriented.  Normal mood and affect.  NEUROLOGIC: No focal motor deficit. Gait not observed.  SKIN: Skin is warm, dry, and intact.      Results/Orders:    Lab on 07/02/2017   Component Date Value Ref Range Status   ??? PSA 07/02/2017 61.10* 0.00 - 4.00 ng/mL Final   ??? Sodium 07/02/2017 137  135 - 145 mmol/L Final   ??? Potassium 07/02/2017 4.3  3.5 - 5.0 mmol/L Final   ??? Chloride 07/02/2017 106  98 - 107 mmol/L Final   ??? CO2 07/02/2017 19.0* 22.0 - 30.0 mmol/L Final   ??? BUN 07/02/2017 37* 7 - 21 mg/dL Final   ??? Creatinine 07/02/2017 1.83* 0.70 - 1.30 mg/dL Final   ??? BUN/Creatinine Ratio 07/02/2017 20   Final   ??? EGFR MDRD Non Af Amer 07/02/2017 38* >=60 mL/min/1.103m2 Final   ??? EGFR MDRD Af Amer 07/02/2017 46* >=60 mL/min/1.59m2 Final   ??? Anion Gap 07/02/2017 12  9 - 15 mmol/L Final   ??? Glucose 07/02/2017 122  65 - 179 mg/dL Final   ???  Calcium 07/02/2017 9.0  8.5 - 10.2 mg/dL Final   ??? Albumin 16/05/9603 3.4* 3.5 - 5.0 g/dL Final   ??? Total Protein 07/02/2017 6.3* 6.5 - 8.3 g/dL Final   ??? Total Bilirubin 07/02/2017 0.6  0.0 - 1.2 mg/dL Final   ??? AST 54/04/8118 57* 19 - 55 U/L Final   ??? ALT 07/02/2017 91* 19 - 72 U/L Final   ??? Alkaline Phosphatase 07/02/2017 280* 38 - 126 U/L Final   ??? Phosphorus 07/02/2017 3.4  2.9 - 4.7 mg/dL Final   ??? WBC 14/78/2956 13.8* 4.5 - 11.0 10*9/L Final   ??? RBC 07/02/2017 3.55* 4.50 - 5.90 10*12/L Final   ??? HGB 07/02/2017 12.1* 13.5 - 17.5 g/dL Final   ??? HCT 21/30/8657 37.1* 41.0 - 53.0 % Final   ??? MCV 07/02/2017 104.5* 80.0 - 100.0 fL Final   ??? MCH 07/02/2017 34.0  26.0 - 34.0 pg Final   ??? MCHC 07/02/2017 32.5  31.0 - 37.0 g/dL Final   ??? RDW 84/69/6295 20.2* 12.0 - 15.0 % Final   ??? MPV 07/02/2017 8.0  7.0 - 10.0 fL Final   ??? Platelet 07/02/2017 196  150 - 440 10*9/L Final   ??? Absolute Neutrophils 07/02/2017 12.7* 2.0 - 7.5 10*9/L Final   ??? Absolute Lymphocytes 07/02/2017 0.7* 1.5 - 5.0 10*9/L Final   ??? Absolute Monocytes 07/02/2017 0.3  0.2 - 0.8 10*9/L Final   ??? Absolute Eosinophils 07/02/2017 0.1  0.0 - 0.4 10*9/L Final   ??? Absolute Basophils 07/02/2017 0.0  0.0 - 0.1 10*9/L Final   ??? Large Unstained Cells 07/02/2017 0  0 - 4 % Final   ??? Microcytosis 07/02/2017 Slight* Not Present Final   ??? Macrocytosis 07/02/2017 Marked* Not Present Final   ??? Anisocytosis 07/02/2017 Moderate* Not Present Final   ??? Smear Review Comments 07/02/2017 See Comment* Undefined Final    Slide reviewed.      ??? Burr Cells 07/02/2017 Present* Not Present Final       PSA   Date Value Ref Range Status   07/02/2017 61.10 (H) 0.00 - 4.00 ng/mL Final   05/21/2017 60.50 (H) 0.00 - 4.00 ng/mL Final   04/23/2017 64.20 (H) 0.00 - 4.00 ng/mL Final   03/26/2017 50.70 (H) 0.00 - 4.00 ng/mL Final   02/21/2017 67.60 (H) 0.00 - 4.00 ng/mL Final   01/10/2017 60.80 (H) 0.00 - 4.00 ng/mL Final   10/11/2016 28.10 (H) 0.00 - 4.00 ng/mL Final   08/30/2016 24.90 (H) 0.00 - 4.00 ng/mL Final   07/19/2016 32.60 (H) 0.00 - 4.00 ng/mL Final   06/21/2016 21.00 (H) 0.00 - 4.00 ng/mL Final   Baseline PSA prior to enzalutamide is 32.6 on 07/19/2016      Testosterone   Date Value Ref Range Status   06/21/2016 10 (L) 179 - 756 ng/dL Final   28/41/3244 010 (L) 179 - 756 ng/dL Final   27/25/3664 84 (L) 179 - 756 ng/dL Final              Orders placed or performed in visit on 07/02/17   ??? CBC w/ Differential   ??? PSA, Diagnostic   ??? Comprehensive Metabolic Panel   ??? Phosphorus Level   ??? CBC w/ Differential   ??? Morphology Review   ??? Morphology Review     Pathology:  Final Diagnosis    ?? A:?? Prostate,?? Right base, needle biopsy ??  - Prostatic adenocarcinoma, Gleason score 4 + 3 = 7 (Grade group 3) involving 2  of 2 cores, approximately 12 and 12 mm in discontinuous linear extent, approximately 80% of total core length.    B:?? Prostate,?? Right mid, needle biopsy   - Prostatic adenocarcinoma, Gleason score 4 + 3 = 7 (Grade group 3) involving 2 of 2 cores, approximately 11 and 7 mm in discontinuous linear extent, approximately 70% of total core length.    C:?? Prostate,?? Right apex, needle biopsy   - Prostatic adenocarcinoma, Gleason score 3 + 5 = 8 (Grade group 4) involving 2 of 2 cores, approximately 9 and 14 mm in discontinuous linear extent, approximately 80% of total core length. ??  - Perineural invasion identified in this case.    D:?? Prostate,?? Left base, needle biopsy   - Prostatic adenocarcinoma, Gleason score 4 + 5 = 9 (Grade group 5) involving 2 of 2 cores, approximately 13 and 13 mm in discontinuous linear extent, approximately 90% of total core length.     E:?? Prostate,?? Left mid, needle biopsy   - Prostatic adenocarcinoma, Gleason score 4 + 4 = 8 (Grade group 4) involving 2 of 2 cores, approximately 5 and 15 mm in discontinuous linear extent, approximately 70% of total core length.     F:?? Prostate,?? Left apex, needle biopsy   - Prostatic adenocarcinoma, Gleason score 4 + 5 = 9 (Grade group 5) involving 2 of 2 cores, approximately 9 and 11 mm in linear extent, approximately 90% of total core length. ??  - Suspicious for extraprostatic extension         Imaging results:  PET scan in 10/15/2014  IMPRESSION:  Markedly hypermetabolic right paratracheal lesion, consistent with  the patient's known malignancy. This is associated with  hypermetabolic metastases in the right hilum and scattered sclerotic  hypermetabolic bone metastases.    The cystic lesion in the pancreatic head is not hypermetabolic on  PET imaging. As such, this finding may not be related to metastatic  disease. Attention on followup imaging is suggested.    Mild FDG uptake in both adrenal glands with asymmetric appearance  and no underlying nodule or mass. Uptake felt to be physiologic, but  attention to these areas on follow-up is also recommended.    No evidence for hypermetabolic disease in the liver on today's  Study.    MRI of brain 08/30/2016  No evidence of intracranial metastasis      Bone scan 08/30/2016  Diffuse osseous metastatic disease, predominantly throughout the axial skeleton.    Bone scan 02/07/2017  Impression    Diffuse osseous metastatic disease predominantly in the axial skeleton, similar to prior. The only obvious new focus is in the right posterior ninth rib    X ray of femur R 02/21/2017  Impression      1. Unchanged severe right hip osteoarthrosis likely secondary to femoral head collapse related to osteonecrosis.    2. Likely distal femoral bone infarct. Otherwise, no focal lytic or blastic bone lesion.        MRI T/L Spine (performed at [facility] 03/2017)  Diffuse osseous mets, possible T10 pathologic fracture without height loss. No canal stenosis. Mild L3-4, L4-5 neural foraminal narrowing

## 2017-07-03 ENCOUNTER — Ambulatory Visit: Payer: Medicare Other

## 2017-07-03 ENCOUNTER — Other Ambulatory Visit: Payer: Medicare Other

## 2017-07-03 ENCOUNTER — Ambulatory Visit: Payer: Medicare Other | Admitting: Internal Medicine

## 2017-07-05 NOTE — Unmapped (Signed)
appts 

## 2017-07-05 NOTE — Unmapped (Signed)
-----   Message from Despina Arias sent at 07/04/2017  6:30 AM EST -----  Regarding: RE: radium  Boneta Lucks,    I can order a dose for Tuesday, Nov. 20, but I need to know by noon today if this is when the patient will come. Also, please verify with the patient that he does plan to come since last time he did not show up and we had to eat the cost of his dose.     Thanks,  Dot Lanes    ----- Message -----  From: Marica Otter, RN  Sent: 07/03/2017   2:22 PM  To: Despina Arias  Subject: FW: radium                                       Hi-can we get an appt for radium the first of next week?  Thanks,  Boneta Lucks  ----- Message -----  From: Estevan Oaks  Sent: 07/02/2017   9:38 AM  To: Marica Otter, RN, Aris Everts  Subject: radium                                           Pt needs RTC 1wk navigator setting up visit with [facility]. Pt prefers early morning if possible.    Thanks.

## 2017-07-05 NOTE — Unmapped (Signed)
Care plan  

## 2017-07-05 NOTE — Unmapped (Signed)
-----   Message from Maurie Boettcher, MD sent at 07/05/2017  9:39 AM EST -----  Regarding: return appt  He needs an appointment to see me on 12/18, with pre-visit lab appointment.    Boneta Lucks, if he gets Radium treatment next week on 11/20, then he will be due for his last Radium on 12/18.  So that should be set up as well.    Thanks.    Young

## 2017-07-05 NOTE — Unmapped (Signed)
-----   Message from Estevan Oaks sent at 07/05/2017  2:58 PM EST -----  Regarding: RE: return appt  Pt scheduled as requested; brother-in-law notified.  ----- Message -----  From: Marica Otter, RN  Sent: 07/05/2017  11:38 AM  To: Estevan Oaks  Subject: FW: return appt                                      ----- Message -----  From: Maurie Boettcher, MD  Sent: 07/05/2017   9:39 AM  To: Marica Otter, RN, Amber L Hagaman  Subject: return appt                                      He needs an appointment to see me on 12/18, with pre-visit lab appointment.    Boneta Lucks, if he gets Radium treatment next week on 11/20, then he will be due for his last Radium on 12/18.  So that should be set up as well.    Thanks.    Young

## 2017-07-09 ENCOUNTER — Ambulatory Visit
Admission: RE | Admit: 2017-07-09 | Discharge: 2017-07-22 | Disposition: A | Discharge status: Home / Self Care | Payer: MEDICARE

## 2017-07-09 ENCOUNTER — Ambulatory Visit
Admission: RE | Admit: 2017-07-09 | Discharge: 2017-07-22 | Disposition: A | Discharge status: Home / Self Care | Payer: MEDICARE | Attending: Pharmacist Clinician (PhC)/ Clinical Pharmacy Specialist | Admitting: Pharmacist Clinician (PhC)/ Clinical Pharmacy Specialist

## 2017-07-09 DIAGNOSIS — C7951 Secondary malignant neoplasm of bone: Secondary | ICD-10-CM

## 2017-07-09 DIAGNOSIS — G893 Neoplasm related pain (acute) (chronic): Secondary | ICD-10-CM

## 2017-07-09 DIAGNOSIS — C61 Malignant neoplasm of prostate: Principal | ICD-10-CM

## 2017-07-09 LAB — CBC W/ AUTO DIFF
BASOPHILS ABSOLUTE COUNT: 0 10*9/L (ref 0.0–0.1)
EOSINOPHILS ABSOLUTE COUNT: 0 10*9/L (ref 0.0–0.4)
HEMOGLOBIN: 12.2 g/dL — ABNORMAL LOW (ref 13.5–17.5)
LARGE UNSTAINED CELLS: 1 % (ref 0–4)
LYMPHOCYTES ABSOLUTE COUNT: 0.5 10*9/L — ABNORMAL LOW (ref 1.5–5.0)
MEAN CORPUSCULAR HEMOGLOBIN CONC: 32.6 g/dL (ref 31.0–37.0)
MEAN CORPUSCULAR HEMOGLOBIN: 34 pg (ref 26.0–34.0)
MEAN CORPUSCULAR VOLUME: 104.3 fL — ABNORMAL HIGH (ref 80.0–100.0)
MONOCYTES ABSOLUTE COUNT: 0.3 10*9/L (ref 0.2–0.8)
PLATELET COUNT: 220 10*9/L (ref 150–440)
RED BLOOD CELL COUNT: 3.59 10*12/L — ABNORMAL LOW (ref 4.50–5.90)
RED CELL DISTRIBUTION WIDTH: 19.4 % — ABNORMAL HIGH (ref 12.0–15.0)
WBC ADJUSTED: 10.5 10*9/L (ref 4.5–11.0)

## 2017-07-09 LAB — COMPREHENSIVE METABOLIC PANEL
ALBUMIN: 3.6 g/dL (ref 3.5–5.0)
ALKALINE PHOSPHATASE: 249 U/L — ABNORMAL HIGH (ref 38–126)
ALT (SGPT): 101 U/L — ABNORMAL HIGH (ref 19–72)
ANION GAP: 11 mmol/L (ref 9–15)
BILIRUBIN TOTAL: 0.5 mg/dL (ref 0.0–1.2)
BLOOD UREA NITROGEN: 37 mg/dL — ABNORMAL HIGH (ref 7–21)
BUN / CREAT RATIO: 22
CALCIUM: 9 mg/dL (ref 8.5–10.2)
CHLORIDE: 104 mmol/L (ref 98–107)
CO2: 24 mmol/L (ref 22.0–30.0)
CREATININE: 1.72 mg/dL — ABNORMAL HIGH (ref 0.70–1.30)
EGFR MDRD NON AF AMER: 41 mL/min/{1.73_m2} — ABNORMAL LOW (ref >=60)
GLUCOSE RANDOM: 97 mg/dL (ref 65–179)
POTASSIUM: 4.8 mmol/L (ref 3.5–5.0)
PROTEIN TOTAL: 6.4 g/dL — ABNORMAL LOW (ref 6.5–8.3)
SODIUM: 139 mmol/L (ref 135–145)

## 2017-07-09 LAB — MICROCYTES

## 2017-07-09 LAB — PROSTATE SPECIFIC ANTIGEN: Prostate specific Ag:MCnc:Pt:Ser/Plas:Qn:: 62.5 — ABNORMAL HIGH

## 2017-07-09 LAB — PHOSPHORUS: Phosphate:MCnc:Pt:Ser/Plas:Qn:: 3.8

## 2017-07-09 LAB — BUN / CREAT RATIO: Urea nitrogen/Creatinine:MRto:Pt:Ser/Plas:Qn:: 22

## 2017-07-09 MED ORDER — OXYCODONE ER 40 MG TABLET,CRUSH RESISTANT,EXTENDED RELEASE 12 HR
ORAL_TABLET | Freq: Two times a day (BID) | ORAL | 0 refills | 0 days | Status: CP
Start: 2017-07-09 — End: 2017-07-30

## 2017-07-09 MED ORDER — OXYCODONE-ACETAMINOPHEN 10 MG-325 MG TABLET
ORAL_TABLET | ORAL | 0 refills | 0.00000 days | Status: CP | PRN
Start: 2017-07-09 — End: 2017-07-30

## 2017-07-09 NOTE — Unmapped (Signed)
Lab drawn and sent for analysis.

## 2017-07-09 NOTE — Unmapped (Signed)
OUTPATIENT ONCOLOGY PALLIATIVE CARE    Principal Diagnosis: Bruce Parker is a 56 y/o M with unfortunately two known metastatic cancers including metastatic lung cancer dx 2/16 on nivolumab and metastatic prostate cancer dx 12/16 with known bony dx and T10 pathologic fx castrate resistant now on Radium 223 and enzalutamide who is referred to Palliative Care Clinic for help with ongoing pain/symptom management and GOC discussion.     Assessment/Plan:   1.  Malignant pain: primarily lower back. Pain acutely worsened at last visit likely because patient had run out of medications due to self-titration. Doses were adjusted at last visit and patient reports compliance without self-adjustment. No opioid changes warranted at today's visit.   - Continue Oxycontin 40 mg po BID   - Continue oxycodone/APAP 10/325 - 1-2 tabs q4h prn. Max of 8 tabs/day. Instructed patient to not go above 2 tablet dose    2. Goals of Care/Advanced Care Planning  Previously Discussed with Bruce Parker out role in support him during this journey. He expressed sadness about his prognosis but also hope that he actually has many years left. His goals right now are to live as long as he can, but also enjoy the little things like visiting with family, sitting in the outdoors and staying independent as much as he can. He would like his sister, Bruce Parker, to be his HCPOA but has not formally filled out the paperwork yet. He does not want to be a burden on his family and if he becomes dependent for his ADLs he would like to go to a nursing home as he does not want them to have to take care of him. When he dies he would like to go peacefully in his sleep if possible. He has a strong faith and believes Jesus gives him only what he can handle and knows when it is his time. Does not have any formal Ads in place.        Plan:   -- continue to discuss GOC at each visit, today we mostly explored his current feelings and supported his hopes for the future. However I do worry his time is shorter than he would like and will discuss further GOC as we get to know him better   -- refer to patient relations for completion of formal Ads, would like sister Bruce Parker to be HCPOA    3. Controlled substances risk management.   - Patient has a signed pain medication agreement with Outpatient Palliative Care, completed on 05/21/17, as per standard of care.   - [place]CCSRS database was reviewed today and it was appropriate.    - Urine drug screen was performed 05/21/17. Findings: positive for oxymorphone only (metabolite of oxycodone)   - Patient has received information about safe storage and administration of medications.   - Patient has received a prescription for narcan; Brother-in-law was educated on its use    F/u: 4 weeks in conjunction with visit with Dr. Philomena Course  ----------------------------------------  Referring Oncology Provider: Dr. Philomena Course (Prostate Cancer provider) and Dr. Sofie Hartigan (lung cancer provider)  PCP: Lonie Peak, [facility]      HPI:  Bruce Parker is a 56 y/o M with unfortunately two known metastatic cancers including metastatic lung cancer dx 2/16 on nivolumab and metastatic prostate cancer dx 12/16 with known bony dx and T10 pathologic fx castrate resistant now on Radium 223 and enzalutamide who is referred to Palliative Care Clinic for help with ongoing pain/symptom management and GOC discussion. Recently his PSA has increased from  50.7 to 64.2 and prostate cancer physician believes he has prognosis of 1 month to 1 year but continuing radium 223 infusion, enzalutamide and lupron    Current cancer-directed therapy: Currently receiving Q2 week nivolumab , radium 223, lupron and enzalutamide    Interval history, 07/09/17, MDK:   - General: here today with brother-in-law and feeling much better overall  - Pain primarily lower back, radiates down bilateral legs. Pain improved with opioid changes at last visit and averages at 6/10. He's taking Oxycontin 40 mg BID and oxy/APAP 10/325 mg as 2 tabs BID.   - Bowels are moving regularly with colace daily and miralax prn  - Appetite: good  - Sleep: significantly improved with better pain relief    Palliative Performance Scale: 70% - Ambulation: Reduced / unable to do normal work, some evidence of disease / Self-Care: Full / Intake: Normal or reduced / Level of Conscious: Full    Coping/Support Issues:   Denies any support or coping issues. Has strong family, friend and church support    Goals of Care:   Bruce Parker reports that his main goal is to live as long as he can. He would also like to live to next summer to enjoy the simple things in life including the fresh air, watching cars go by and taking care of himself. If he were to become to sick to take care of himself he would like to go to a nursing home so he would not be a burden for his family. If his time comes he would like to go in his sleep. He does not want surgery but otherwise is ok to continue aggressive treatments at this time, no formal Ads completed or in place    Social History:   [name] lives in [place] alone. However his sister Bruce Parker and [place] Bruce Parker are actively involved in his care and check on him every day. His sister does most of his medications and his BIL takes him to all of his appointments. He uses a walker to get around, able to do his own ADLs but gets help with cooking, laundry and does not drive. He uses [employer] 24/7. He used to [employer], also worked as a Nurse, adult. Denies EtOH, previous smoker but quit, denies drugs.    Advance Care Planning:   Does not have any formal ADs in place. Would like his sister, Bruce Parker, to be his formal health care decision maker but has not completed the paperwork for his.   HCPOA: none  Natural surrogate decision maker: sister  Living Will: none  ACP note: none    Objective       Oncology History    --In 04/2014, PSA elevated to 56.4. DRE normal.  Prostate bx scheduled, but canceled by pt.  --In 09/2014, diagnosed with nonsmall cell lung cancer, stage IV (T2b, N2, M1b) with a large R hilar mass, mediastinal adenopathy, mets to bones. No uptake in liver or pancreas on PET scan. Insufficient material for molecular testing.  Guardant360 for ctDNA was negative.  Pt was subsequently treated with carboplatin/Alimta with initial good response.  Then disease progression.  On second line therapy with nivolumab.  --In 01/2015, PSA 62.2.  Seen in Urology clinic and referred to [facility] for possibility of prostate cancer.  After discussions about various options, underwent bone bx in 05/2015, nondiagnostic. PSA down to 23.7  --In 07/2015, prostate bx showed Gleason 4+5=9 adenocarcinoma.  --In 08/2015, androgen deprivation therapy with Lupron started.  PSA 354.  --In 11/2015, Lupron continued. PSA down to 1.7, which was the nadir. Subsequently, PSA began rising.  --In 06/2016, PSA up to 32.  Enzalutamide started.  MRI of brain neg. Bone scan stable.  --In 02/2017, PSA rise on enzalutamide. Bone scan stable. Radium 223 added        Lung cancer, primary, with metastasis from lung to other site (CMS-HCC) (Resolved)    10/11/2014 Initial Diagnosis     Lung cancer, primary, with metastasis from lung to other site [facility])           Prostate cancer (CMS-HCC)    07/27/2015 Initial Diagnosis     Prostate cancer (RAF-HCC)            Patient Active Problem List   Diagnosis   ??? Derangement of right patella   ??? Hypertension, benign   ??? Osteoarthrosis   ??? Chronic renal disease, stage 3, moderately decreased glomerular filtration rate (GFR) between 30-59 mL/min/1.73 square meter (CMS-HCC)   ??? Lichen planus   ??? Weakness   ??? Lung mass   ??? Mediastinal lymphadenopathy   ??? Malignant neoplasm metastatic to bone (CMS-HCC)   ??? Pain of metastatic malignancy   ??? Non-small cell carcinoma of lung (CMS-HCC)   ??? Protein-calorie malnutrition (CMS-HCC)   ??? Acute respiratory failure (CMS-HCC)   ??? Shortness of breath   ??? Tobacco dependence syndrome   ??? Prostate cancer (CMS-HCC) Past Medical History:   Diagnosis Date   ??? Lung cancer (CMS-HCC)    ??? On supplemental oxygen therapy        Past Surgical History:   Procedure Laterality Date   ??? APPENDECTOMY     ??? PR BRNCHSC EBUS GUIDED SAMPL 3/> NODE STATION/STRUX N/A 05/18/2015    Procedure: Bronch, Rigid Or Flexible, Including Fluoro Guidance, When Performed; W Ebus Guided Transtracheal And/Or Transbronchial Sampling, 3 Or More Mediastinal And/Or Hilar Lymph Node Stations Or Structures;  Surgeon: Mathis Bud, MD;  Location: MAIN [facility];  Service: Pulmonary   ??? small intestines removed per pt         Current Outpatient Prescriptions   Medication Sig Dispense Refill   ??? albuterol 2.5 mg /3 mL (0.083 %) nebulizer solution Inhale 2.5 mg by nebulization every four (4) hours as needed.      ??? AMITIZA 24 mcg capsule Take 24 mcg by mouth daily with breakfast.      ??? atorvastatin (LIPITOR) 40 MG tablet      ??? betamethasone dipropionate (DIPROLENE) 0.05 % ointment Apply twice daily to affected areas as needed for itching 45 g 2   ??? enzalutamide (XTANDI) 40 mg cap capsule Take 4 capsules (160 mg total) by mouth daily. 120 capsule 11   ??? food supplemt, lactose-reduced (ENSURE PLUS) 0.05-1.5 gram-kcal/mL liquid Take 237 mL by mouth daily.      ??? lactose-reduced food (ENSURE ENLIVE ORAL) Take 237 mL by mouth.     ??? naloxone (NARCAN) 4 mg nasal spray One spray in either nostril once for known/suspected opioid overdose. May repeat every 2-3 minutes in alternating nostril til EMS arrives 1 each 0   ??? oxyCODONE (OXYCONTIN) 40 mg 12 hr crush resistant tablet Take 1 tablet (40 mg total) by mouth two (2) times a day as needed for pain. 30 tablet 0   ??? oxyCODONE-acetaminophen (PERCOCET) 10-325 mg per tablet Take 1-2 tablets by mouth every four (4) hours as needed for pain. 120 tablet 0   ??? pantoprazole (PROTONIX) 40 MG  tablet Take 40 mg by mouth.     ??? polyethylene glycol (MIRALAX) 17 gram packet Take 17 g by mouth daily.      ??? predniSONE (DELTASONE) 10 MG tablet      ??? prochlorperazine (COMPAZINE) 10 MG tablet Take 10 mg by mouth every eight (8) hours as needed.      ??? senna-docusate (PERICOLACE) 8.6-50 mg Take 1 tablet by mouth.     ??? sodium bicarbonate 650 mg tablet Take 650 mg by mouth.     ??? SUMAtriptan (IMITREX) 25 MG tablet      ??? tamsulosin (FLOMAX) 0.4 mg capsule Take 1 capsule (0.4 mg total) by mouth daily. 30 capsule 11   ??? triamcinolone (KENALOG) 0.1 % ointment Apply to arms twice a day until clear, then stop. (Patient taking differently: Apply 1 application topically two (2) times a day as needed. ) 30 g 1   ??? valACYclovir (VALTREX) 1000 MG tablet Take 1 tablet (1,000 mg total) by mouth daily. 30 tablet 0   ??? diphenhydrAMINE (BENADRYL) 25 mg tablet Take 25 mg by mouth every six (6) hours as needed.      ??? doxycycline (VIBRA-TABS) 100 MG tablet Take 1 tablet (100 mg total) by mouth daily. (Patient not taking: Reported on 07/09/2017) 30 tablet 0   ??? fluocinolone 0.01 % external oil Bid to back (Patient not taking: Reported on 07/09/2017) 120 mL 2   ??? folic acid (FOLVITE) 1 MG tablet Take 1 mg by mouth.     ??? furosemide (LASIX) 20 MG tablet Take 20 mg by mouth.     ??? levocetirizine (XYZAL) 5 MG tablet Take 5 mg by mouth.     ??? lidocaine-prilocaine (EMLA) cream Place on port site the morning of chemo     ??? magnesium oxide (MAG-OX) 400 mg tablet Take 400 mg by mouth.     ??? metoprolol tartrate (LOPRESSOR) 25 MG tablet Take 12.5 mg by mouth.     ??? mirtazapine (REMERON) 15 MG tablet Take 15 mg by mouth nightly.        No current facility-administered medications for this visit.        Allergies: No Known Allergies    Family History:  Cancer-related family history includes Cancer in his brother. There is no history of Melanoma.  indicated that the status of his mother is unknown. He indicated that the status of his brother is unknown. He indicated that the status of his neg hx is unknown.         Lab Results   Component Value Date    CREATININE 1.72 (H) 07/09/2017     Lab Results   Component Value Date    ALKPHOS 249 (H) 07/09/2017    BILITOT 0.5 07/09/2017    PROT 6.4 (L) 07/09/2017    ALBUMIN 3.6 07/09/2017    ALT 101 (H) 07/09/2017    AST 70 (H) 07/09/2017        I personally spent over half of a total 20 minutes in counseling and discussion with the patient as described above.    Jenkins Rouge, PharmD, BCOP, CPP  Outpatient Oncology Palliative Care

## 2017-07-10 ENCOUNTER — Ambulatory Visit (HOSPITAL_BASED_OUTPATIENT_CLINIC_OR_DEPARTMENT_OTHER): Payer: Medicare Other | Admitting: Internal Medicine

## 2017-07-10 ENCOUNTER — Ambulatory Visit (HOSPITAL_BASED_OUTPATIENT_CLINIC_OR_DEPARTMENT_OTHER): Payer: Medicare Other

## 2017-07-10 ENCOUNTER — Telehealth: Payer: Self-pay | Admitting: Internal Medicine

## 2017-07-10 ENCOUNTER — Encounter: Payer: Self-pay | Admitting: Internal Medicine

## 2017-07-10 ENCOUNTER — Ambulatory Visit: Payer: Medicare Other

## 2017-07-10 ENCOUNTER — Other Ambulatory Visit (HOSPITAL_BASED_OUTPATIENT_CLINIC_OR_DEPARTMENT_OTHER): Payer: Medicare Other

## 2017-07-10 VITALS — BP 125/84 | HR 123 | Temp 98.2°F | Resp 18 | Wt 175.3 lb

## 2017-07-10 DIAGNOSIS — C7889 Secondary malignant neoplasm of other digestive organs: Secondary | ICD-10-CM

## 2017-07-10 DIAGNOSIS — Z23 Encounter for immunization: Secondary | ICD-10-CM

## 2017-07-10 DIAGNOSIS — C3491 Malignant neoplasm of unspecified part of right bronchus or lung: Secondary | ICD-10-CM

## 2017-07-10 DIAGNOSIS — C787 Secondary malignant neoplasm of liver and intrahepatic bile duct: Secondary | ICD-10-CM

## 2017-07-10 DIAGNOSIS — Z5112 Encounter for antineoplastic immunotherapy: Secondary | ICD-10-CM

## 2017-07-10 DIAGNOSIS — C3401 Malignant neoplasm of right main bronchus: Secondary | ICD-10-CM | POA: Diagnosis not present

## 2017-07-10 DIAGNOSIS — T50905A Adverse effect of unspecified drugs, medicaments and biological substances, initial encounter: Secondary | ICD-10-CM

## 2017-07-10 DIAGNOSIS — C7951 Secondary malignant neoplasm of bone: Secondary | ICD-10-CM | POA: Diagnosis not present

## 2017-07-10 DIAGNOSIS — C801 Malignant (primary) neoplasm, unspecified: Principal | ICD-10-CM

## 2017-07-10 DIAGNOSIS — Z7189 Other specified counseling: Secondary | ICD-10-CM

## 2017-07-10 DIAGNOSIS — G893 Neoplasm related pain (acute) (chronic): Secondary | ICD-10-CM

## 2017-07-10 DIAGNOSIS — K716 Toxic liver disease with hepatitis, not elsewhere classified: Secondary | ICD-10-CM

## 2017-07-10 DIAGNOSIS — R5382 Chronic fatigue, unspecified: Secondary | ICD-10-CM

## 2017-07-10 LAB — COMPREHENSIVE METABOLIC PANEL
ALT: 75 U/L — ABNORMAL HIGH (ref 0–55)
ANION GAP: 10 meq/L (ref 3–11)
AST: 47 U/L — ABNORMAL HIGH (ref 5–34)
Albumin: 3 g/dL — ABNORMAL LOW (ref 3.5–5.0)
Alkaline Phosphatase: 269 U/L — ABNORMAL HIGH (ref 40–150)
BUN: 42 mg/dL — ABNORMAL HIGH (ref 7.0–26.0)
CO2: 23 meq/L (ref 22–29)
Calcium: 9.1 mg/dL (ref 8.4–10.4)
Chloride: 109 mEq/L (ref 98–109)
Creatinine: 2.1 mg/dL — ABNORMAL HIGH (ref 0.7–1.3)
EGFR: 40 mL/min/{1.73_m2} — AB (ref >60)
GLUCOSE: 100 mg/dL (ref 70–140)
POTASSIUM: 4.2 meq/L (ref 3.5–5.1)
SODIUM: 141 meq/L (ref 136–145)
Total Bilirubin: 0.37 mg/dL (ref 0.20–1.20)
Total Protein: 6.4 g/dL (ref 6.4–8.3)

## 2017-07-10 LAB — CBC WITH DIFFERENTIAL/PLATELET
BASO%: 0.2 % (ref 0.0–2.0)
Basophils Absolute: 0 10*3/uL (ref 0.0–0.1)
EOS ABS: 0.1 10*3/uL (ref 0.0–0.5)
EOS%: 1.4 % (ref 0.0–7.0)
HCT: 37.8 % — ABNORMAL LOW (ref 38.4–49.9)
HGB: 12.5 g/dL — ABNORMAL LOW (ref 13.0–17.1)
LYMPH%: 17.9 % (ref 14.0–49.0)
MCH: 33.5 pg — AB (ref 27.2–33.4)
MCHC: 33.1 g/dL (ref 32.0–36.0)
MCV: 101.3 fL — AB (ref 79.3–98.0)
MONO#: 0.9 10*3/uL (ref 0.1–0.9)
MONO%: 10.2 % (ref 0.0–14.0)
NEUT%: 70.3 % (ref 39.0–75.0)
NEUTROS ABS: 6.5 10*3/uL (ref 1.5–6.5)
Platelets: 198 10*3/uL (ref 140–400)
RBC: 3.73 10*6/uL — AB (ref 4.20–5.82)
RDW: 17.4 % — ABNORMAL HIGH (ref 11.0–14.6)
WBC: 9.3 10*3/uL (ref 4.0–10.3)
lymph#: 1.7 10*3/uL (ref 0.9–3.3)

## 2017-07-10 MED ORDER — INFLUENZA VAC SPLIT QUAD 0.5 ML IM SUSY
0.5000 mL | PREFILLED_SYRINGE | Freq: Once | INTRAMUSCULAR | Status: AC
  Administered 2017-07-10: 0.5 mL via INTRAMUSCULAR
  Filled 2017-07-10: qty 0.5

## 2017-07-10 NOTE — Telephone Encounter (Signed)
Scheduled appt per 11/21 los - Gave patient AVS and calender per los.  

## 2017-07-10 NOTE — Progress Notes (Signed)
ON PATHWAY REGIMEN - Non-Small Cell Lung  No Change  Continue With Treatment as Ordered.     A cycle is every 28 days:     Nivolumab   **Always confirm dose/schedule in your pharmacy ordering system**    Patient Characteristics: Stage IV Metastatic, Non Squamous, Second Line - Chemotherapy/Immunotherapy, PS = 0, 1, No Prior PD-1/PD-L1  Inhibitor and Immunotherapy Candidate AJCC T Category: T2b Current Disease Status: Distant Metastases AJCC N Category: N2 AJCC M Category: M1c AJCC 8 Stage Grouping: IVB Histology: Non Squamous Cell ROS1 Rearrangement Status: Negative T790M Mutation Status: Not Applicable - EGFR Mutation Negative/Unknown Other Mutations/Biomarkers: No Other Actionable Mutations PD-L1 Expression Status: Quantity Not Sufficient Chemotherapy/Immunotherapy LOT: Second Line Chemotherapy/Immunotherapy Molecular Targeted Therapy: Not Appropriate ALK Translocation Status: Negative Would you be surprised if this patient died  in the next year<= I would NOT be surprised if this patient died in the next year EGFR Mutation Status: Negative/Wild Type BRAF [id number] Mutation Status: Negative Performance Status: PS = 0, 1 Immunotherapy Candidate Status: Candidate for Immunotherapy Prior Immunotherapy Status: No Prior PD-1/PD-L1 Inhibitor Intent of Therapy: Non-Curative / Palliative Intent, Discussed with Patient

## 2017-07-10 NOTE — Progress Notes (Signed)
[place] Telephone:(336) [phone]   Fax:(336) [place], [place] [address]  DIAGNOSIS:  1) Stage IV (T2b, N2, M1b) non-small cell lung cancer, adenocarcinoma with negative EGFR mutation and negative gene translocation presented with a large right hilar mass as well as mediastinal lymphadenopathy and metastatic disease to the liver, bone and pancreas diagnosed in February 2016. 2) prostate adenocarcinoma diagnosed at [facility] with Gleason score 9 (4+5). 3) treatment with Radium 223 for prostate cancer at [facility] on 02/21/2017  PRIOR THERAPY:  1) Systemic chemotherapy with carboplatin for AUC of 5 and Alimta 500 MG/M2 every 3 weeks. Status post 6 cycles. 2) Enzalutamide for prostate cancer at [facility] 3) Immunotherapy with Nivolumab 240 MG every 2 weeks, status post 48 cycles. 4) Radium 223 monthly at [facility], S/p 2 cycles. 5) Immunotherapy with Nivolumab 480 MG every 4 weeks, first cycle 04/10/2017. Status post one cycle.  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  CURRENT THERAPY:  Resuming immunotherapy with Nivolumab 240 mg IV every 2 weeks.  First dose July 24, 2017  INTERVAL HISTORY: Francisco Love 56 y.o. male returns to the clinic today for follow-up visit accompanied by his brother-in-law.  The patient is feeling much better today except for the persistent low back pain and he is currently on pain management by the pain clinic at [facility].  He denied having any current chest pain, shortness of breath, cough or hemoptysis.  He denied having any weight loss or night sweats.  He has no nausea, vomiting, diarrhea or constipation.  He has been on a taper dose of prednisone for the last 5 weeks secondary to immunotherapy mediated hepatitis.  The patient is feeling much better.  He had a repeat CBC and comprehensive metabolic panel performed  earlier today and he is here for evaluation and recommendation regarding treatment of his condition.  He is currently on prednisone 20 mg p.o. daily.  MEDICAL HISTORY: Past Medical History:  Diagnosis Date  . Chronic fatigue 04/12/2016  . Chronic pain 04/12/2016  . Chronic renal disease, stage III ([place])   . Metastatic [place] dx'd 09/2014    ALLERGIES:  has No Known Allergies.  MEDICATIONS:  Current Outpatient Medications  Medication Sig Dispense Refill  . AMITIZA 24 MCG capsule Take 24 mcg by mouth 2 (two) times daily with a meal.     . atorvastatin (LIPITOR) 40 MG tablet Take 40 mg by mouth daily.    . betamethasone dipropionate (DIPROLENE) 0.05 % ointment Apply twice daily to affected areas as needed for itching    . bicalutamide (CASODEX) 50 MG tablet Take 1 tablet (50 mg total) by mouth daily. HOLD UNTIL SEEN BY ONCOLOGY.    . folic acid (FOLVITE) 1 MG tablet Take 1 mg by mouth daily.    . furosemide (LASIX) 20 MG tablet Take 1 tablet (20 mg total) by mouth daily as needed for fluid or edema. 30 tablet 0  . lidocaine-prilocaine (EMLA) cream Apply 1 application topically as needed. Apply to port site prior to chemotherapy. 30 g 0  . magnesium oxide (MAG-OX) 400 MG tablet Take 400 mg by mouth daily.    . metoprolol tartrate (LOPRESSOR) 25 MG tablet Take 12.5 mg by mouth.    . mirtazapine (REMERON) 15 MG tablet Take 1 tablet (15 mg total) by mouth at bedtime.    . Nutritional Supplements (  ENSURE ACTIVE HIGH PROTEIN PO) Take 1 Can by mouth daily as needed.    Marland Kitchen oxyCODONE (OXYCONTIN) 40 mg 12 hr tablet Take 40 mg by mouth every 12 (twelve) hours.    Marland Kitchen oxyCODONE-acetaminophen (PERCOCET) 10-325 MG tablet Take 1-2 tablets by mouth every 4 (four) hours as needed.    . pantoprazole (PROTONIX) 40 MG tablet Take 1 tablet (40 mg total) by mouth daily. 30 tablet 0  . polyethylene glycol (MIRALAX / GLYCOLAX) packet Take 17 g by mouth daily as needed for mild constipation. 14 each 0  .  senna-docusate (SENOKOT-S) 8.6-50 MG per tablet Take 1 tablet by mouth daily.    . sodium bicarbonate 650 MG tablet Take 1 tablet (650 mg total) by mouth daily. 30 tablet 0  . tamsulosin (FLOMAX) 0.4 MG CAPS capsule Take 0.4 mg by mouth daily.     [name] 40 MG capsule Take 160 mg by mouth every evening.     Marland Kitchen albuterol (PROVENTIL) (2.5 MG/3ML) 0.083% nebulizer solution Take 3 mLs (2.5 mg total) by nebulization every 2 (two) hours as needed for wheezing. (Patient not taking: Reported on 07/10/2017) 75 mL 12  . naloxone (NARCAN) nasal spray 4 mg/0.1 mL Place 1 spray into the nose once.    Marland Kitchen oxyCODONE (OXY IR/ROXICODONE) 5 MG immediate release tablet Take 1 tablet (5 mg total) by mouth every 4 (four) hours as needed for severe pain. 60 tablet 0  . prochlorperazine (COMPAZINE) 10 MG tablet Take 1 tablet (10 mg total) by mouth every 6 (six) hours as needed for nausea or vomiting. (Patient not taking: Reported on 07/10/2017) 30 tablet 1  . SUMAtriptan (IMITREX) 25 MG tablet Take 25 mg by mouth every 2 (two) hours as needed for migraine.     . valACYclovir (VALTREX) 500 MG tablet Take 500 mg by mouth 2 (two) times daily.     Current Facility-Administered Medications  Medication Dose Route Frequency Provider Last Rate Last Dose  . Influenza vac split quadrivalent PF (FLUARIX) injection 0.5 mL  0.5 mL Intramuscular Once Curt Bears, MD        SURGICAL HISTORY:  Past Surgical History:  Procedure Laterality Date  . APPENDECTOMY    . EUS N/A 03/28/2017   Procedure: UPPER ENDOSCOPIC ULTRASOUND (EUS) RADIAL;  Surgeon: Milus Banister, MD;  Location: [facility];  Service: Endoscopy;  Laterality: N/A;  . VIDEO BRONCHOSCOPY N/A 09/21/2014   Procedure: VIDEO BRONCHOSCOPY WITH FLUORO;  Surgeon: Rigoberto Noel, MD;  Location: [facility];  Service: Cardiopulmonary;  Laterality: N/A;  . VIDEO BRONCHOSCOPY WITH ENDOBRONCHIAL ULTRASOUND N/A 09/23/2014   Procedure: VIDEO BRONCHOSCOPY WITH ENDOBRONCHIAL  ULTRASOUND;  Surgeon: Rigoberto Noel, MD;  Location: [place];  Service: Pulmonary;  Laterality: N/A;    REVIEW OF SYSTEMS:  Constitutional: negative Eyes: negative Ears, nose, mouth, throat, and face: negative Respiratory: negative Cardiovascular: negative Gastrointestinal: negative Genitourinary:negative Integument/breast: negative Hematologic/lymphatic: negative Musculoskeletal:positive for back pain Neurological: negative Behavioral/Psych: negative Endocrine: negative Allergic/Immunologic: negative   PHYSICAL EXAMINATION: General appearance: alert, cooperative and no distress Head: Normocephalic, without obvious abnormality, atraumatic Neck: no adenopathy, no JVD, supple, symmetrical, trachea midline and thyroid not enlarged, symmetric, no tenderness/mass/nodules Lymph nodes: Cervical, supraclavicular, and axillary nodes normal. Resp: clear to auscultation bilaterally Back: symmetric, no curvature. ROM normal. No CVA tenderness. Cardio: regular rate and rhythm, S1, S2 normal, no murmur, click, rub or gallop GI: soft, non-tender; bowel sounds normal; no masses,  no organomegaly Extremities: extremities normal, atraumatic, no cyanosis or edema Neurologic:  Alert and oriented X 3, normal strength and tone. Normal symmetric reflexes. Normal coordination and gait   ECOG PERFORMANCE STATUS: 1 - Symptomatic but completely ambulatory  Blood pressure 125/84, pulse (!) 123, temperature 98.2 F (36.8 C), temperature source Oral, resp. rate 18, weight 175 lb 4.8 oz (79.5 kg), SpO2 93 %.  LABORATORY DATA: Lab Results  Component Value Date   WBC 9.3 07/10/2017   HGB 12.5 (L) 07/10/2017   HCT 37.8 (L) 07/10/2017   MCV 101.3 (H) 07/10/2017   PLT 198 07/10/2017      Chemistry      Component Value Date/Time   NA 141 07/10/2017 0744   K 4.2 07/10/2017 0744   CL 114 (H) 05/18/2017 0500   CO2 23 07/10/2017 0744   BUN 42.0 (H) 07/10/2017 0744   CREATININE 2.1 (H) 07/10/2017 0744        Component Value Date/Time   CALCIUM 9.1 07/10/2017 0744   ALKPHOS 269 (H) 07/10/2017 0744   AST 47 (H) 07/10/2017 0744   ALT 75 (H) 07/10/2017 0744   BILITOT 0.37 07/10/2017 0744       RADIOGRAPHIC STUDIES: Ct Chest W Contrast  Result Date: 06/12/2017 CLINICAL DATA:  Restaging of static non-small cell lung cancer, ongoing chemotherapy. Home oxygen therapy for hypoxia. EXAM: CT CHEST, ABDOMEN, AND PELVIS WITH CONTRAST TECHNIQUE: Multidetector CT imaging of the chest, abdomen and pelvis was performed following the standard protocol during bolus administration of intravenous contrast. CONTRAST:  13m ISOVUE-300 IOPAMIDOL (ISOVUE-300) INJECTION 61% COMPARISON:  Multiple exams, including CT scans from 05/15/2017 and 01/17/2017, and MRI spine from 04/01/2017 FINDINGS: CT CHEST FINDINGS Cardiovascular: Coronary, aortic arch, and branch vessel atherosclerotic vascular disease. Mediastinum/Nodes: Right paratracheal mass with questionable right upper lobe involvement, 5.8 by 3.8 cm on image 23/2, formerly 5.6 by 3.2 cm by my measurements. Subcarinal node 1.4 cm in short axis, image 34/2, formerly 1.3 cm by my measurements. AP window lymph node 0.9 cm in short axis on image 28/2, formerly 0.8 cm by my measurements. Lungs/Pleura: Stable posterior pleural thickening bilaterally. Stable bilateral emphysema. Biapical pleuroparenchymal scarring. There is some worsening in dependent ground-glass densities in both lower lobes and to a lesser extent in the right middle lobe. Nodularity within a cluster of bulla in the right lower lobe measuring 0.7 by 0.5 cm on image 89/6, stable. Partially calcified nodule in the left upper lobe surrounded by bulla, 0.6 cm in diameter on image 72/6, stable. 0.5 cm nodule in the superior segment left lower lobe on image 64/6, previously measured 6 mm in diameter. No new nodules identified. Musculoskeletal: Widespread osseous metastatic disease throughout the thorax, slightly more  sclerotic than on 01/17/2017 but not changed in overall distribution compared to that time. The involves essentially all vertebral levels along with the ribs and sternum as well as both scapula. CT ABDOMEN PELVIS FINDINGS Hepatobiliary: Stable 1.0 by 0.8 cm hypodense lesion in segment 2 of the liver on image 52/2, not appreciably changed from 01/17/2017. Minimally contracted gallbladder. Pancreas: Partially calcified and partially hypodense lesion in the head of the pancreas measuring approximately 2.4 by 1.8 cm on image 71/2, stable from the PET-CT of 10/15/2014 where this lesion was not hypermetabolic. There is dilatation of the dorsal pancreatic duct in the pancreatic body up to 7 mm in diameter, image 66/2. This dilatation may well be due to extrinsic compression of the dorsal pancreatic duct by the pancreatic head lesion. Spleen: Unremarkable Adrenals/Urinary Tract: Adrenal glands unremarkable. Duplicated right renal collecting system. Mildly  atrophic left kidney. Urinary bladder unremarkable. Stomach/Bowel: Unremarkable Vascular/Lymphatic: Aortoiliac atherosclerotic vascular disease. There has long been stenosis of the left common iliac artery, for example on the exam from 12/24/2014, but the left proximal common iliac artery is now occluded the, and reconstituted after a short segment by collateral vessels. In the area of the bifurcation and occlusion there is surrounding soft tissue stranding/density, which has been chronic. Reproductive: Unremarkable Other: No supplemental non-categorized findings. Musculoskeletal: Stable distribution of widespread osseous metastatic disease. Severe degenerative arthropathy of both hips with chronic flattening and volume loss in both femoral heads. New superior endplate compression fracture at L5 with new 30% loss of height and no significant bony retropulsion. Previous superior endplate compression fracture of L1, stable. Notable Schmorl's node along the superior endplate  of L5, as before. Direct left inguinal hernia containing adipose tissue. IMPRESSION: 1. The dominant right paratracheal mass is mildly enlarged compared to prior chest CT of 03/27/2017, and thoracic lymph nodes are borderline increased in size compared to that time as well. 2. Several small pulmonary nodules are stable in size. 3. Interval mild worsening in dependent ground-glass densities in both lower lobes and to a lesser extent in the right middle lobe, possibly from low-grade edema or drug reaction. 4. Stable distribution of widespread osseous metastatic disease. However, there is a new 30% superior endplate compression fracture at L5 compared to 9/20 6/18. 5. Chronic occlusion of the left common iliac artery with reconstitution after a short segment. 6. Other imaging findings of potential clinical significance: Aortic Atherosclerosis (ICD10-I70.0) and Emphysema (ICD10-J43.9). Coronary atherosclerosis. Chronic hypodense lesion in segment 2 of the liver, likely benign. Cystic and calcified lesion in the head of the pancreas is chronically stable and was not previously hypermetabolic, although entities such as intraductal papillary mucinous neoplasm or serous cystadenoma are not excluded ; there is dilatation of the dorsal pancreatic duct in the pancreatic body. Duplicated right renal collecting system. Mild atrophy of the left kidney. Severe arthropathy of both hips with flattening of the femoral heads, query dysplastic hips. Direct left inguinal hernia containing adipose tissue. Electronically Signed   By: Van Clines M.D.   On: 06/12/2017 08:44   Ct Abdomen Pelvis W Contrast  Result Date: 06/12/2017 CLINICAL DATA:  Restaging of static non-small cell lung cancer, ongoing chemotherapy. Home oxygen therapy for hypoxia. EXAM: CT CHEST, ABDOMEN, AND PELVIS WITH CONTRAST TECHNIQUE: Multidetector CT imaging of the chest, abdomen and pelvis was performed following the standard protocol during bolus  administration of intravenous contrast. CONTRAST:  53m ISOVUE-300 IOPAMIDOL (ISOVUE-300) INJECTION 61% COMPARISON:  Multiple exams, including CT scans from 05/15/2017 and 01/17/2017, and MRI spine from 04/01/2017 FINDINGS: CT CHEST FINDINGS Cardiovascular: Coronary, aortic arch, and branch vessel atherosclerotic vascular disease. Mediastinum/Nodes: Right paratracheal mass with questionable right upper lobe involvement, 5.8 by 3.8 cm on image 23/2, formerly 5.6 by 3.2 cm by my measurements. Subcarinal node 1.4 cm in short axis, image 34/2, formerly 1.3 cm by my measurements. AP window lymph node 0.9 cm in short axis on image 28/2, formerly 0.8 cm by my measurements. Lungs/Pleura: Stable posterior pleural thickening bilaterally. Stable bilateral emphysema. Biapical pleuroparenchymal scarring. There is some worsening in dependent ground-glass densities in both lower lobes and to a lesser extent in the right middle lobe. Nodularity within a cluster of bulla in the right lower lobe measuring 0.7 by 0.5 cm on image 89/6, stable. Partially calcified nodule in the left upper lobe surrounded by bulla, 0.6 cm in diameter on image 72/6, stable.  0.5 cm nodule in the superior segment left lower lobe on image 64/6, previously measured 6 mm in diameter. No new nodules identified. Musculoskeletal: Widespread osseous metastatic disease throughout the thorax, slightly more sclerotic than on 01/17/2017 but not changed in overall distribution compared to that time. The involves essentially all vertebral levels along with the ribs and sternum as well as both scapula. CT ABDOMEN PELVIS FINDINGS Hepatobiliary: Stable 1.0 by 0.8 cm hypodense lesion in segment 2 of the liver on image 52/2, not appreciably changed from 01/17/2017. Minimally contracted gallbladder. Pancreas: Partially calcified and partially hypodense lesion in the head of the pancreas measuring approximately 2.4 by 1.8 cm on image 71/2, stable from the PET-CT of 10/15/2014  where this lesion was not hypermetabolic. There is dilatation of the dorsal pancreatic duct in the pancreatic body up to 7 mm in diameter, image 66/2. This dilatation may well be due to extrinsic compression of the dorsal pancreatic duct by the pancreatic head lesion. Spleen: Unremarkable Adrenals/Urinary Tract: Adrenal glands unremarkable. Duplicated right renal collecting system. Mildly atrophic left kidney. Urinary bladder unremarkable. Stomach/Bowel: Unremarkable Vascular/Lymphatic: Aortoiliac atherosclerotic vascular disease. There has long been stenosis of the left common iliac artery, for example on the exam from 12/24/2014, but the left proximal common iliac artery is now occluded the, and reconstituted after a short segment by collateral vessels. In the area of the bifurcation and occlusion there is surrounding soft tissue stranding/density, which has been chronic. Reproductive: Unremarkable Other: No supplemental non-categorized findings. Musculoskeletal: Stable distribution of widespread osseous metastatic disease. Severe degenerative arthropathy of both hips with chronic flattening and volume loss in both femoral heads. New superior endplate compression fracture at L5 with new 30% loss of height and no significant bony retropulsion. Previous superior endplate compression fracture of L1, stable. Notable Schmorl's node along the superior endplate of L5, as before. Direct left inguinal hernia containing adipose tissue. IMPRESSION: 1. The dominant right paratracheal mass is mildly enlarged compared to prior chest CT of 03/27/2017, and thoracic lymph nodes are borderline increased in size compared to that time as well. 2. Several small pulmonary nodules are stable in size. 3. Interval mild worsening in dependent ground-glass densities in both lower lobes and to a lesser extent in the right middle lobe, possibly from low-grade edema or drug reaction. 4. Stable distribution of widespread osseous metastatic  disease. However, there is a new 30% superior endplate compression fracture at L5 compared to 9/20 6/18. 5. Chronic occlusion of the left common iliac artery with reconstitution after a short segment. 6. Other imaging findings of potential clinical significance: Aortic Atherosclerosis (ICD10-I70.0) and Emphysema (ICD10-J43.9). Coronary atherosclerosis. Chronic hypodense lesion in segment 2 of the liver, likely benign. Cystic and calcified lesion in the head of the pancreas is chronically stable and was not previously hypermetabolic, although entities such as intraductal papillary mucinous neoplasm or serous cystadenoma are not excluded ; there is dilatation of the dorsal pancreatic duct in the pancreatic body. Duplicated right renal collecting system. Mild atrophy of the left kidney. Severe arthropathy of both hips with flattening of the femoral heads, query dysplastic hips. Direct left inguinal hernia containing adipose tissue. Electronically Signed   By: Van Clines M.D.   On: 06/12/2017 08:44   ASSESSMENT AND PLAN:  This is a very pleasant 56 years old African-American male with metastatic non-small cell lung cancer, adenocarcinoma status post induction systemic chemotherapy was carboplatin and Alimta. He was started on second line treatment with Nivolumab 240 mg IV every 2 weeks is  status post 48 cycles. He is currently on treatment with Nivolumab 480 MG IV every 4 weeks is status post 1 cycle. This treatment with the high dose Nivolumab was discontinued secondary to immunotherapy mediated hepatitis. The patient was a started on high-dose prednisone that has been tapered over the last several weeks. His comprehensive metabolic panel today showed significant improvement in his liver enzymes. I had a lengthy discussion with the patient and his brother-in-law today about his condition.  I recommended for him to resume his treatment with Nivolumab but we will returns to the previous 2 weekly schedule  with Nivolumab 240 mg IV every 2 weeks.  The patient tolerated this treatment much better in the past. He is expected to start the first dose of this treatment on July 24, 2017. For pain management he will continue his current pain medication by the pain clinic at [facility]. The patient was advised to call immediately if he has any concerning symptoms in the interval. The patient voices understanding of current disease status and treatment options and is in agreement with the current care plan. All questions were answered. The patient knows to call the clinic with any problems, questions or concerns. We can certainly see the patient much sooner if necessary.  Disclaimer: This note was dictated with voice recognition software. Similar sounding words can inadvertently be transcribed and may not be corrected upon review.

## 2017-07-10 NOTE — Patient Instructions (Signed)

## 2017-07-24 NOTE — Unmapped (Signed)
[facility] Refill Coordination Note  Specialty Medication(s): [name]  Additional Medications shipped: NO    Bruce Parker, DOB: October 19, 1960  Phone: [phone] (home) , Alternate phone contact: N/A  Phone or address changes today?: No  All above HIPAA information was verified with patient's family member.BROTHER IN LAW- Bruce Parker  Shipping Address: [address]  [place] [address]   Insurance changes? No    Completed refill call assessment today to schedule patient's medication shipment from the [facility] [phone]).      Confirmed the medication and dosage are correct and have not changed: Yes, regimen is correct and unchanged.    Confirmed patient started or stopped the following medications in the past month:  No, there are no changes reported at this time.    Are you tolerating your medication?:  Bruce Parker reports tolerating the medication.    ADHERENCE    Did you miss any doses in the past 4 weeks? No missed doses reported.    FINANCIAL/SHIPPING    Delivery Scheduled: Yes, Expected medication delivery date: 07/31/17     Bruce Parker did not have any additional questions at this time.    Delivery address validated in [facility] scheduling system: Yes, address listed in [facility] is correct.    We will follow up with patient monthly for standard refill processing and delivery.      Thank you,  Marletta Lor   [facility] Specialty Pharmacist

## 2017-07-25 ENCOUNTER — Ambulatory Visit (HOSPITAL_BASED_OUTPATIENT_CLINIC_OR_DEPARTMENT_OTHER): Payer: Medicare Other | Admitting: Oncology

## 2017-07-25 ENCOUNTER — Encounter: Payer: Self-pay | Admitting: Oncology

## 2017-07-25 ENCOUNTER — Ambulatory Visit (HOSPITAL_BASED_OUTPATIENT_CLINIC_OR_DEPARTMENT_OTHER): Payer: Medicare Other

## 2017-07-25 ENCOUNTER — Telehealth: Payer: Self-pay | Admitting: Oncology

## 2017-07-25 ENCOUNTER — Other Ambulatory Visit (HOSPITAL_BASED_OUTPATIENT_CLINIC_OR_DEPARTMENT_OTHER): Payer: Medicare Other

## 2017-07-25 VITALS — BP 122/79 | HR 92 | Temp 97.6°F | Resp 19 | Ht 69.0 in | Wt 176.8 lb

## 2017-07-25 DIAGNOSIS — C801 Malignant (primary) neoplasm, unspecified: Principal | ICD-10-CM

## 2017-07-25 DIAGNOSIS — R5382 Chronic fatigue, unspecified: Secondary | ICD-10-CM

## 2017-07-25 DIAGNOSIS — Z5112 Encounter for antineoplastic immunotherapy: Secondary | ICD-10-CM

## 2017-07-25 DIAGNOSIS — C3491 Malignant neoplasm of unspecified part of right bronchus or lung: Secondary | ICD-10-CM

## 2017-07-25 DIAGNOSIS — Z79899 Other long term (current) drug therapy: Secondary | ICD-10-CM | POA: Diagnosis not present

## 2017-07-25 DIAGNOSIS — C7889 Secondary malignant neoplasm of other digestive organs: Secondary | ICD-10-CM | POA: Diagnosis not present

## 2017-07-25 DIAGNOSIS — C61 Malignant neoplasm of prostate: Secondary | ICD-10-CM | POA: Diagnosis not present

## 2017-07-25 DIAGNOSIS — C3401 Malignant neoplasm of right main bronchus: Secondary | ICD-10-CM | POA: Diagnosis present

## 2017-07-25 DIAGNOSIS — C787 Secondary malignant neoplasm of liver and intrahepatic bile duct: Secondary | ICD-10-CM

## 2017-07-25 DIAGNOSIS — C7951 Secondary malignant neoplasm of bone: Secondary | ICD-10-CM | POA: Diagnosis not present

## 2017-07-25 DIAGNOSIS — G893 Neoplasm related pain (acute) (chronic): Secondary | ICD-10-CM

## 2017-07-25 LAB — CBC WITH DIFFERENTIAL/PLATELET
BASO%: 0.5 % (ref 0.0–2.0)
BASOS ABS: 0 10*3/uL (ref 0.0–0.1)
EOS ABS: 0.1 10*3/uL (ref 0.0–0.5)
EOS%: 0.8 % (ref 0.0–7.0)
HCT: 36.4 % — ABNORMAL LOW (ref 38.4–49.9)
HEMOGLOBIN: 12 g/dL — AB (ref 13.0–17.1)
LYMPH#: 0.8 10*3/uL — AB (ref 0.9–3.3)
LYMPH%: 12.3 % — ABNORMAL LOW (ref 14.0–49.0)
MCH: 33.8 pg — AB (ref 27.2–33.4)
MCHC: 33.1 g/dL (ref 32.0–36.0)
MCV: 102.1 fL — AB (ref 79.3–98.0)
MONO#: 0.9 10*3/uL (ref 0.1–0.9)
MONO%: 13.6 % (ref 0.0–14.0)
NEUT#: 5 10*3/uL (ref 1.5–6.5)
NEUT%: 72.8 % (ref 39.0–75.0)
Platelets: 173 10*3/uL (ref 140–400)
RBC: 3.57 10*6/uL — AB (ref 4.20–5.82)
RDW: 16.3 % — AB (ref 11.0–14.6)
WBC: 6.9 10*3/uL (ref 4.0–10.3)

## 2017-07-25 LAB — COMPREHENSIVE METABOLIC PANEL
ALT: 51 U/L (ref 0–55)
ANION GAP: 12 meq/L — AB (ref 3–11)
AST: 49 U/L — ABNORMAL HIGH (ref 5–34)
Albumin: 2.9 g/dL — ABNORMAL LOW (ref 3.5–5.0)
Alkaline Phosphatase: 242 U/L — ABNORMAL HIGH (ref 40–150)
BUN: 32.1 mg/dL — ABNORMAL HIGH (ref 7.0–26.0)
CALCIUM: 9.1 mg/dL (ref 8.4–10.4)
CHLORIDE: 109 meq/L (ref 98–109)
CO2: 18 meq/L — AB (ref 22–29)
CREATININE: 2 mg/dL — AB (ref 0.7–1.3)
EGFR: 42 mL/min/{1.73_m2} — AB (ref >60)
Glucose: 96 mg/dl (ref 70–140)
Potassium: 4.4 mEq/L (ref 3.5–5.1)
Sodium: 139 mEq/L (ref 136–145)
Total Bilirubin: 0.53 mg/dL (ref 0.20–1.20)
Total Protein: 6.6 g/dL (ref 6.4–8.3)

## 2017-07-25 LAB — TSH: TSH: 1.987 m(IU)/L (ref 0.320–4.118)

## 2017-07-25 MED ORDER — HEPARIN SOD (PORK) LOCK FLUSH 100 UNIT/ML IV SOLN
500.0000 [IU] | Freq: Once | INTRAVENOUS | Status: AC | PRN
  Administered 2017-07-25: 500 [IU]
  Filled 2017-07-25: qty 5

## 2017-07-25 MED ORDER — SODIUM CHLORIDE 0.9 % IV SOLN
Freq: Once | INTRAVENOUS | Status: AC
  Administered 2017-07-25: 10:00:00 via INTRAVENOUS

## 2017-07-25 MED ORDER — SODIUM CHLORIDE 0.9% FLUSH
10.0000 mL | INTRAVENOUS | Status: DC | PRN
  Administered 2017-07-25: 10 mL
  Filled 2017-07-25: qty 10

## 2017-07-25 MED ORDER — SODIUM CHLORIDE 0.9 % IV SOLN
240.0000 mg | Freq: Once | INTRAVENOUS | Status: AC
  Administered 2017-07-25: 240 mg via INTRAVENOUS
  Filled 2017-07-25: qty 24

## 2017-07-25 NOTE — Progress Notes (Signed)
[place] OFFICE PROGRESS NOTE  Curt Bears, MD [address] [place] [address]  DIAGNOSIS:  1) Stage IV (T2b, N2, M1b) non-small cell lung cancer, adenocarcinoma with negative EGFR mutation and negative gene translocation presented with a large right hilar mass as well as mediastinal lymphadenopathy and metastatic disease to the liver, bone and pancreas diagnosed in February 2016. 2) prostate adenocarcinoma diagnosed at [facility] with Gleason score 9 (4+5). 3) treatment with Radium 223 for prostate cancer at [facility] on 02/21/2017  PRIOR THERAPY: 1) Systemic chemotherapy with carboplatin for AUC of 5 and Alimta 500 MG/M2 every 3 weeks. Status post 6 cycles. 2) Enzalutamide for prostate cancer at [facility] 3) Immunotherapy with Nivolumab 240 MG every 2 weeks, status post 48 cycles. 4) Radium 223 monthly at [facility], S/p 2 cycles. 5) Immunotherapy with Nivolumab 480 MG every 4 weeks, first cycle 04/10/2017. Status post one cycle.  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  CURRENT THERAPY: Resuming immunotherapy with Nivolumab 240 mg IV every 2 weeks.  First dose 07/25/2017.  INTERVAL HISTORY: Francisco Love 56 y.o. male returns for routine follow-up visit accompanied by his brother-in-law.  Patient reports that he is feeling well today except for feeling tired.  He denies fevers and chills.  Denies chest pain, shortness of breath, cough, hemoptysis.  Denies nausea, vomiting, constipation, diarrhea.  The patient remains on tapering dose of prednisone secondary to immune mediated hepatitis.  He is feeling better.  He reports that his current dose is 20 mg daily.  He has not decreased his prednisone dose for several weeks as instructed.  The patient is here for evaluations prior to restarting Nivolumab.  MEDICAL HISTORY: Past Medical History:  Diagnosis Date  . Chronic fatigue 04/12/2016  . Chronic pain 04/12/2016   . Chronic renal disease, stage III ([place])   . Metastatic cancer ([place]) dx'd 09/2014    ALLERGIES:  has No Known Allergies.  MEDICATIONS:  Current Outpatient Medications  Medication Sig Dispense Refill  . albuterol (PROVENTIL) (2.5 MG/3ML) 0.083% nebulizer solution Take 3 mLs (2.5 mg total) by nebulization every 2 (two) hours as needed for wheezing. 75 mL 12  . AMITIZA 24 MCG capsule Take 24 mcg by mouth 2 (two) times daily with a meal.     . atorvastatin (LIPITOR) 40 MG tablet Take 40 mg by mouth daily.    . betamethasone dipropionate (DIPROLENE) 0.05 % ointment Apply twice daily to affected areas as needed for itching    . bicalutamide (CASODEX) 50 MG tablet Take 1 tablet (50 mg total) by mouth daily. HOLD UNTIL SEEN BY ONCOLOGY.    Marland Kitchen doxycycline (DORYX) 100 MG EC tablet Take 100 mg by mouth 2 (two) times daily.    . folic acid (FOLVITE) 1 MG tablet Take 1 mg by mouth daily.    . furosemide (LASIX) 20 MG tablet Take 1 tablet (20 mg total) by mouth daily as needed for fluid or edema. 30 tablet 0  . lidocaine-prilocaine (EMLA) cream Apply 1 application topically as needed. Apply to port site prior to chemotherapy. 30 g 0  . magnesium oxide (MAG-OX) 400 MG tablet Take 400 mg by mouth daily.    . mirtazapine (REMERON) 15 MG tablet Take 1 tablet (15 mg total) by mouth at bedtime.    Marland Kitchen oxyCODONE (OXY IR/ROXICODONE) 5 MG immediate release tablet Take 1 tablet (5 mg total) by mouth every 4 (four) hours as needed for severe pain. [place]  tablet 0  . oxyCODONE (OXYCONTIN) 40 mg 12 hr tablet Take 40 mg by mouth every 12 (twelve) hours.    Marland Kitchen oxyCODONE-acetaminophen (PERCOCET) 10-325 MG tablet Take 1-2 tablets by mouth every 4 (four) hours as needed.    . predniSONE (DELTASONE) 20 MG tablet Take 20 mg by mouth daily with breakfast.    . prochlorperazine (COMPAZINE) 10 MG tablet Take 1 tablet (10 mg total) by mouth every 6 (six) hours as needed for nausea or vomiting. 30 tablet 1  . sodium bicarbonate 650 MG  tablet Take 1 tablet (650 mg total) by mouth daily. 30 tablet 0  . tamsulosin (FLOMAX) 0.4 MG CAPS capsule Take 0.4 mg by mouth daily.     . valACYclovir (VALTREX) 500 MG tablet Take 500 mg by mouth 2 (two) times daily.    [name] 40 MG capsule Take 160 mg by mouth every evening.     . naloxone (NARCAN) nasal spray 4 mg/0.1 mL Place 1 spray into the nose once.    . Nutritional Supplements (ENSURE ACTIVE HIGH PROTEIN PO) Take 1 Can by mouth daily as needed.    . polyethylene glycol (MIRALAX / GLYCOLAX) packet Take 17 g by mouth daily as needed for mild constipation. (Patient not taking: Reported on 07/25/2017) 14 each 0  . senna-docusate (SENOKOT-S) 8.6-50 MG per tablet Take 1 tablet by mouth daily. (Patient not taking: Reported on 07/25/2017)     No current facility-administered medications for this visit.    Facility-Administered Medications Ordered in Other Visits  Medication Dose Route Frequency Provider Last Rate Last Dose  . sodium chloride flush (NS) 0.9 % injection 10 mL  10 mL Intracatheter PRN Curt Bears, MD   10 mL at 07/25/17 1108    SURGICAL HISTORY:  Past Surgical History:  Procedure Laterality Date  . APPENDECTOMY    . EUS N/A 03/28/2017   Procedure: UPPER ENDOSCOPIC ULTRASOUND (EUS) RADIAL;  Surgeon: Milus Banister, MD;  Location: [facility];  Service: Endoscopy;  Laterality: N/A;  . VIDEO BRONCHOSCOPY N/A 09/21/2014   Procedure: VIDEO BRONCHOSCOPY WITH FLUORO;  Surgeon: Rigoberto Noel, MD;  Location: [place];  Service: Cardiopulmonary;  Laterality: N/A;  . VIDEO BRONCHOSCOPY WITH ENDOBRONCHIAL ULTRASOUND N/A 09/23/2014   Procedure: VIDEO BRONCHOSCOPY WITH ENDOBRONCHIAL ULTRASOUND;  Surgeon: Rigoberto Noel, MD;  Location: [name];  Service: Pulmonary;  Laterality: N/A;    REVIEW OF SYSTEMS:   Review of Systems  Constitutional: Negative for appetite change, chills, fatigue, fever and unexpected weight change.  HENT:   Negative for mouth sores, nosebleeds, sore throat  and trouble swallowing.   Eyes: Negative for eye problems and icterus.  Respiratory: Negative for cough, hemoptysis, shortness of breath and wheezing.   Cardiovascular: Negative for chest pain and leg swelling.  Gastrointestinal: Negative for abdominal pain, constipation, diarrhea, nausea and vomiting.  Genitourinary: Negative for bladder incontinence, difficulty urinating, dysuria, frequency and hematuria.   Musculoskeletal: Negative for back pain, gait problem, neck pain and neck stiffness.  Skin: Negative for itching and rash.  Neurological: Negative for dizziness, extremity weakness, gait problem, headaches, light-headedness and seizures.  Hematological: Negative for adenopathy. Does not bruise/bleed easily.  Psychiatric/Behavioral: Negative for confusion, depression and sleep disturbance. The patient is not nervous/anxious.     PHYSICAL EXAMINATION:  Blood pressure 122/79, pulse 92, temperature 97.6 F (36.4 C), temperature source Oral, resp. rate 19, height '5\' 9"'$  (1.753 m), weight 176 lb 12.8 oz (80.2 kg), SpO2 96 %.  ECOG PERFORMANCE STATUS: 1 -  Symptomatic but completely ambulatory  Physical Exam  Constitutional: Oriented to person, place, and time and well-developed, well-nourished, and in no distress. No distress.  HENT:  Head: Normocephalic and atraumatic.  Mouth/Throat: Oropharynx is clear and moist. No oropharyngeal exudate.  Eyes: Conjunctivae are normal. Right eye exhibits no discharge. Left eye exhibits no discharge. No scleral icterus.  Neck: Normal range of motion. Neck supple.  Cardiovascular: Normal rate, regular rhythm, normal heart sounds and intact distal pulses.   Pulmonary/Chest: Effort normal and breath sounds normal. No respiratory distress. No wheezes. No rales.  Abdominal: Soft. Bowel sounds are normal. Exhibits no distension and no mass. There is no tenderness.  Musculoskeletal: Normal range of motion. Exhibits no edema.  Lymphadenopathy:    No cervical  adenopathy.  Neurological: Alert and oriented to person, place, and time. Exhibits normal muscle tone. Gait normal. Coordination normal.  Skin: Skin is warm and dry. No rash noted. Not diaphoretic. No erythema. No pallor.  Psychiatric: Mood, memory and judgment normal.  Vitals reviewed.  LABORATORY DATA: Lab Results  Component Value Date   WBC 6.9 07/25/2017   HGB 12.0 (L) 07/25/2017   HCT 36.4 (L) 07/25/2017   MCV 102.1 (H) 07/25/2017   PLT 173 07/25/2017      Chemistry      Component Value Date/Time   NA 139 07/25/2017 0814   K 4.4 07/25/2017 0814   CL 114 (H) 05/18/2017 0500   CO2 18 (L) 07/25/2017 0814   BUN 32.1 (H) 07/25/2017 0814   CREATININE 2.0 (H) 07/25/2017 0814      Component Value Date/Time   CALCIUM 9.1 07/25/2017 0814   ALKPHOS 242 (H) 07/25/2017 0814   AST 49 (H) 07/25/2017 0814   ALT 51 07/25/2017 0814   BILITOT 0.53 07/25/2017 0814       RADIOGRAPHIC STUDIES:  No results found.   ASSESSMENT/PLAN:  Non-small cell cancer of right lung [facility]) This is a very pleasant 56 year old African-American male with metastatic non-small cell lung cancer, adenocarcinoma status post induction systemic chemotherapy was carboplatin and Alimta. He was started on second line treatment with Nivolumab 240 mg IV every 2 weeks is status post 48 cycles. He then received treatment with Nivolumab 480 MG IV every 4 weeks is status post 1 cycle. This treatment with the high dose Nivolumab was discontinued secondary to immunotherapy mediated hepatitis. The patient was a started on high-dose prednisone that has been tapered over the last several weeks.  The patient is here to restart Nivolumab 240 mg every 2 weeks. His comprehensive metabolic panel today showed continued improvement in his liver enzymes. The patient will proceed with cycle 1 of Nivolumab 240 mg given every 2 weeks.  Okay to treat the patient with a creatinine of 2.0 which is his baseline.  The patient will return in  2 weeks for evaluation prior to cycle 2 of Nivolumab.  He was instructed to reduce his prednisone dose down to 10 mg daily until completed.  For pain management he will continue his current pain medication by the pain clinic at [facility].  The patient was advised to call immediately if he has any concerning symptoms in the interval. The patient voices understanding of current disease status and treatment options and is in agreement with the current care plan. All questions were answered. The patient knows to call the clinic with any problems, questions or concerns. We can certainly see the patient much sooner if necessary.  No orders of the defined  types were placed in this encounter.  Mikey Bussing, DNP, AGPCNP-BC, AOCNP 07/25/17

## 2017-07-25 NOTE — Telephone Encounter (Signed)
Per 12/6 los - patient was already previously scheduled for their next appts.

## 2017-07-25 NOTE — Patient Instructions (Signed)
[place] Discharge Instructions for Patients Receiving Chemotherapy  Today you received the following chemotherapy agents Opdivo   To help prevent nausea and vomiting after your treatment, we encourage you to take your nausea medication as directed.   If you develop nausea and vomiting that is not controlled by your nausea medication, call the clinic.   BELOW ARE SYMPTOMS THAT SHOULD BE REPORTED IMMEDIATELY:  *FEVER GREATER THAN 100.5 F  *CHILLS WITH OR WITHOUT FEVER  NAUSEA AND VOMITING THAT IS NOT CONTROLLED WITH YOUR NAUSEA MEDICATION  *UNUSUAL SHORTNESS OF BREATH  *UNUSUAL BRUISING OR BLEEDING  TENDERNESS IN MOUTH AND THROAT WITH OR WITHOUT PRESENCE OF ULCERS  *URINARY PROBLEMS  *BOWEL PROBLEMS  UNUSUAL RASH Items with * indicate a potential emergency and should be followed up as soon as possible.  Feel free to call the clinic you have any questions or concerns. The clinic phone number is [phone].  Please show the [place] at check-in to the Emergency Department and triage nurse.   Dehydration, Adult Dehydration is a condition in which there is not enough fluid or water in the body. This happens when you lose more fluids than you take in. Important organs, such as the kidneys, brain, and heart, cannot function without a proper amount of fluids. Any loss of fluids from the body can lead to dehydration. Dehydration can range from mild to severe. This condition should be treated right away to prevent it from becoming severe. What are the causes? This condition may be caused by:  Vomiting.  Diarrhea.  Excessive sweating, such as from heat exposure or exercise.  Not drinking enough fluid, especially: ? When ill. ? While doing activity that requires a lot of energy.  Excessive urination.  Fever.  Infection.  Certain medicines, such as medicines that cause the body to lose excess fluid (diuretics).  Inability to access safe  drinking water.  Reduced physical ability to get adequate water and food.  What increases the risk? This condition is more likely to develop in people:  Who have a poorly controlled long-term (chronic) illness, such as diabetes, heart disease, or kidney disease.  Who are age 58 or older.  Who are disabled.  Who live in a place with high altitude.  Who play endurance sports.  What are the signs or symptoms? Symptoms of mild dehydration may include:  Thirst.  Dry lips.  Slightly dry mouth.  Dry, warm skin.  Dizziness. Symptoms of moderate dehydration may include:  Very dry mouth.  Muscle cramps.  Dark urine. Urine may be the color of tea.  Decreased urine production.  Decreased tear production.  Heartbeat that is irregular or faster than normal (palpitations).  Headache.  Light-headedness, especially when you stand up from a sitting position.  Fainting (syncope). Symptoms of severe dehydration may include:  Changes in skin, such as: ? Cold and clammy skin. ? Blotchy (mottled) or pale skin. ? Skin that does not quickly return to normal after being lightly pinched and released (poor skin turgor).  Changes in body fluids, such as: ? Extreme thirst. ? No tear production. ? Inability to sweat when body temperature is high, such as in hot weather. ? Very little urine production.  Changes in vital signs, such as: ? Weak pulse. ? Pulse that is more than 100 beats a minute when sitting still. ? Rapid breathing. ? Low blood pressure.  Other changes, such as: ? Sunken eyes. ? Cold hands and feet. ? Confusion. ? Lack  of energy (lethargy). ? Difficulty waking up from sleep. ? Short-term weight loss. ? Unconsciousness. How is this diagnosed? This condition is diagnosed based on your symptoms and a physical exam. Blood and urine tests may be done to help confirm the diagnosis. How is this treated? Treatment for this condition depends on the severity. Mild  or moderate dehydration can often be treated at home. Treatment should be started right away. Do not wait until dehydration becomes severe. Severe dehydration is an emergency and it needs to be treated in a hospital. Treatment for mild dehydration may include:  Drinking more fluids.  Replacing salts and minerals in your blood (electrolytes) that you may have lost. Treatment for moderate dehydration may include:  Drinking an oral rehydration solution (ORS). This is a drink that helps you replace fluids and electrolytes (rehydrate). It can be found at pharmacies and retail stores. Treatment for severe dehydration may include:  Receiving fluids through an IV tube.  Receiving an electrolyte solution through a feeding tube that is passed through your nose and into your stomach (nasogastric tube, or NG tube).  Correcting any abnormalities in electrolytes.  Treating the underlying cause of dehydration. Follow these instructions at home:  If directed by your health care provider, drink an ORS: ? Make an ORS by following instructions on the package. ? Start by drinking small amounts, about  cup (120 mL) every 5-10 minutes. ? Slowly increase how much you drink until you have taken the amount recommended by your health care provider.  Drink enough clear fluid to keep your urine clear or pale yellow. If you were told to drink an ORS, finish the ORS first, then start slowly drinking other clear fluids. Drink fluids such as: ? Water. Do not drink only water. Doing that can lead to having too little salt (sodium) in the body (hyponatremia). ? Ice chips. ? Fruit juice that you have added water to (diluted fruit juice). ? Low-calorie sports drinks.  Avoid: ? Alcohol. ? Drinks that contain a lot of sugar. These include high-calorie sports drinks, fruit juice that is not diluted, and soda. ? Caffeine. ? Foods that are greasy or contain a lot of fat or sugar.  Take over-the-counter and prescription  medicines only as told by your health care provider.  Do not take sodium tablets. This can lead to having too much sodium in the body (hypernatremia).  Eat foods that contain a healthy balance of electrolytes, such as bananas, oranges, potatoes, tomatoes, and spinach.  Keep all follow-up visits as told by your health care provider. This is important. Contact a health care provider if:  You have abdominal pain that: ? Gets worse. ? Stays in one area (localizes).  You have a rash.  You have a stiff neck.  You are more irritable than usual.  You are sleepier or more difficult to wake up than usual.  You feel weak or dizzy.  You feel very thirsty.  You have urinated only a small amount of very dark urine over 6-8 hours. Get help right away if:  You have symptoms of severe dehydration.  You cannot drink fluids without vomiting.  Your symptoms get worse with treatment.  You have a fever.  You have a severe headache.  You have vomiting or diarrhea that: ? Gets worse. ? Does not go away.  You have blood or green matter (bile) in your vomit.  You have blood in your stool. This may cause stool to look black and tarry.  You  have not urinated in 6-8 hours.  You faint.  Your heart rate while sitting still is over 100 beats a minute.  You have trouble breathing. This information is not intended to replace advice given to you by your health care provider. Make sure you discuss any questions you have with your health care provider. Document Released: 08/06/2005 Document Revised: 03/02/2016 Document Reviewed: 09/30/2015 Elsevier Interactive Patient Education  [employer].

## 2017-07-25 NOTE — Assessment & Plan Note (Addendum)
This is a very pleasant 56 year old African-American male with metastatic non-small cell lung cancer, adenocarcinoma status post induction systemic chemotherapy was carboplatin and Alimta. He was started on second line treatment with Nivolumab 240 mg IV every 2 weeks is status post 48 cycles. He then received treatment with Nivolumab 480 MG IV every 4 weeks is status post 1 cycle. This treatment with the high dose Nivolumab was discontinued secondary to immunotherapy mediated hepatitis. The patient was a started on high-dose prednisone that has been tapered over the last several weeks.  The patient is here to restart Nivolumab 240 mg every 2 weeks. His comprehensive metabolic panel today showed continued improvement in his liver enzymes. The patient will proceed with cycle 1 of Nivolumab 240 mg given every 2 weeks.  Okay to treat the patient with a creatinine of 2.0 which is his baseline.  The patient will return in 2 weeks for evaluation prior to cycle 2 of Nivolumab.  He was instructed to reduce his prednisone dose down to 10 mg daily until completed.  For pain management he will continue his current pain medication by the pain clinic at [facility].  The patient was advised to call immediately if he has any concerning symptoms in the interval. The patient voices understanding of current disease status and treatment options and is in agreement with the current care plan. All questions were answered. The patient knows to call the clinic with any problems, questions or concerns. We can certainly see the patient much sooner if necessary.

## 2017-07-25 NOTE — Progress Notes (Signed)
CBC and CMET reviewed by MD, " ok to treat, despite lab results"

## 2017-07-29 MED FILL — XTANDI/40MG/CAPS: XTANDI/40MG/CAPS | 30 days supply | Qty: 120 | Fill #7

## 2017-07-30 MED ORDER — OXYCODONE ER 40 MG TABLET,CRUSH RESISTANT,EXTENDED RELEASE 12 HR
ORAL_TABLET | Freq: Two times a day (BID) | ORAL | 0 refills | 0.00000 days | Status: CP
Start: 2017-07-30 — End: 2017-08-06

## 2017-07-30 MED ORDER — OXYCODONE-ACETAMINOPHEN 10 MG-325 MG TABLET
ORAL_TABLET | ORAL | 0 refills | 0 days | Status: CP | PRN
Start: 2017-07-30 — End: 2017-08-06

## 2017-07-30 NOTE — Unmapped (Signed)
Called patient in response to request for opioid refills. [facility] reviewed - pharmacy only filled #15 day supply of Oxycontin (Rx'd as 30 day supply). Spoke with patient but he has difficulty confirming his opioid usage. Brother-in-law and sister help with meds, although do not administer each one to him.    Spoke with brother-in-law who informed me that patient has 6th grade education and is unable to read. They help him as much as they can and are planning to deliver his meds to him today and reiterate a plan. Instructed both patient and BIL to bring opioid bottles to next visit so we can develop a plan to ensure he is taking them correctly.     Jenkins Rouge, PharmD, CPP, BCOP  Outpatient Oncology Palliative Care

## 2017-08-06 ENCOUNTER — Ambulatory Visit
Admission: RE | Admit: 2017-08-06 | Discharge: 2017-08-07 | Disposition: A | Discharge status: Home / Self Care | Payer: MEDICARE | Attending: Pharmacist Clinician (PhC)/ Clinical Pharmacy Specialist | Admitting: Pharmacist Clinician (PhC)/ Clinical Pharmacy Specialist

## 2017-08-06 ENCOUNTER — Ambulatory Visit
Admission: RE | Admit: 2017-08-06 | Discharge: 2017-08-07 | Disposition: A | Discharge status: Home / Self Care | Payer: MEDICARE

## 2017-08-06 ENCOUNTER — Ambulatory Visit
Admission: RE | Admit: 2017-08-06 | Discharge: 2017-08-07 | Disposition: A | Discharge status: Home / Self Care | Payer: MEDICARE | Attending: Hematology & Oncology | Admitting: Hematology & Oncology

## 2017-08-06 DIAGNOSIS — C7951 Secondary malignant neoplasm of bone: Secondary | ICD-10-CM

## 2017-08-06 DIAGNOSIS — C61 Malignant neoplasm of prostate: Principal | ICD-10-CM

## 2017-08-06 DIAGNOSIS — E46 Unspecified protein-calorie malnutrition: Secondary | ICD-10-CM

## 2017-08-06 LAB — CBC W/ AUTO DIFF
BASOPHILS ABSOLUTE COUNT: 0 10*9/L (ref 0.0–0.1)
EOSINOPHILS ABSOLUTE COUNT: 0.1 10*9/L (ref 0.0–0.4)
HEMATOCRIT: 39.1 % — ABNORMAL LOW (ref 41.0–53.0)
LARGE UNSTAINED CELLS: 4 % (ref 0–4)
LYMPHOCYTES ABSOLUTE COUNT: 0.9 10*9/L — ABNORMAL LOW (ref 1.5–5.0)
MEAN CORPUSCULAR HEMOGLOBIN CONC: 33.2 g/dL (ref 31.0–37.0)
MEAN CORPUSCULAR HEMOGLOBIN: 34 pg (ref 26.0–34.0)
MEAN CORPUSCULAR VOLUME: 102.5 fL — ABNORMAL HIGH (ref 80.0–100.0)
MONOCYTES ABSOLUTE COUNT: 0.8 10*9/L (ref 0.2–0.8)
NEUTROPHILS ABSOLUTE COUNT: 5 10*9/L (ref 2.0–7.5)
PLATELET COUNT: 227 10*9/L (ref 150–440)
RED CELL DISTRIBUTION WIDTH: 17 % — ABNORMAL HIGH (ref 12.0–15.0)
WBC ADJUSTED: 7.1 10*9/L (ref 4.5–11.0)

## 2017-08-06 LAB — COMPREHENSIVE METABOLIC PANEL
ALBUMIN: 3.3 g/dL — ABNORMAL LOW (ref 3.5–5.0)
ALKALINE PHOSPHATASE: 315 U/L — ABNORMAL HIGH (ref 38–126)
ANION GAP: 9 mmol/L (ref 9–15)
AST (SGOT): 70 U/L — ABNORMAL HIGH (ref 19–55)
BILIRUBIN TOTAL: 0.8 mg/dL (ref 0.0–1.2)
BUN / CREAT RATIO: 16
CALCIUM: 9.5 mg/dL (ref 8.5–10.2)
CHLORIDE: 108 mmol/L — ABNORMAL HIGH (ref 98–107)
CO2: 22 mmol/L (ref 22.0–30.0)
CREATININE: 2.21 mg/dL — ABNORMAL HIGH (ref 0.70–1.30)
EGFR MDRD AF AMER: 37 mL/min/{1.73_m2} — ABNORMAL LOW (ref >=60)
EGFR MDRD NON AF AMER: 31 mL/min/{1.73_m2} — ABNORMAL LOW (ref >=60)
GLUCOSE RANDOM: 99 mg/dL (ref 65–179)
POTASSIUM: 4.7 mmol/L (ref 3.5–5.0)
PROTEIN TOTAL: 5.8 g/dL — ABNORMAL LOW (ref 6.5–8.3)
SODIUM: 139 mmol/L (ref 135–145)

## 2017-08-06 LAB — PHOSPHORUS: Phosphate:MCnc:Pt:Ser/Plas:Qn:: 4

## 2017-08-06 LAB — PROTEIN TOTAL: Protein:MCnc:Pt:Ser/Plas:Qn:: 5.8 — ABNORMAL LOW

## 2017-08-06 LAB — SMEAR REVIEW

## 2017-08-06 LAB — BASOPHILS ABSOLUTE COUNT: Lab: 0

## 2017-08-06 LAB — PROSTATE SPECIFIC ANTIGEN: Prostate specific Ag:MCnc:Pt:Ser/Plas:Qn:: 70.8 — ABNORMAL HIGH

## 2017-08-06 MED ORDER — OXYCODONE ER 40 MG TABLET,CRUSH RESISTANT,EXTENDED RELEASE 12 HR
ORAL_TABLET | Freq: Two times a day (BID) | ORAL | 0 refills | 0 days | Status: CP
Start: 2017-08-06 — End: 2017-09-04

## 2017-08-06 MED ORDER — OXYCODONE-ACETAMINOPHEN 10 MG-325 MG TABLET
ORAL_TABLET | ORAL | 0 refills | 0.00000 days | Status: CP | PRN
Start: 2017-08-06 — End: 2017-09-04

## 2017-08-06 NOTE — Unmapped (Signed)
Clinical Pharmacist Practitioner: [facility]    Oral Agent Follow-Up    Assessment and recommendations:  1. Enzalutamide monitoring and side effect management- tolerating well he increased his dose back to the prescribed dose of 160 mg. He does not report any new side effects with increased dose. He says he is feeling well.   - Continue 160 mg daily take 4 capsules once daily at noon.      Follow- up: Will f/u at next visit.     ______________________________________________________________________    Oral Agent: Enzalutamide 160 mg daily       Start date: 06/2016    HPI:  Mr. Grater is a 56 yo man with metastatic castration resistant prostate cancer, with bone metastases currently on first line treatment with lupron, enzalutamide, and Radium 223 x 6. He also has Stage IV nonsmall cell lung cancer, on secondline therapy with nivolumab s/p cycle #48 (q2w)    Interim History:   Mr. Farnan has no complaints or side effects he is aware of. He reports minimal fatigue. His blood pressures are well controlled. He is in pain and is being seen by palliative care today for management.    Adherence: Mr. Ridlon is now taking 4 capsules for a total dose of 160 mg daily. He takes his xtandi everyday at noon. He has not missed any doses since last visit.     Toxicities: none to report from xtandi    Medications:  Current Outpatient Prescriptions   Medication Sig Dispense Refill   ??? albuterol 2.5 mg /3 mL (0.083 %) nebulizer solution Inhale 2.5 mg by nebulization every four (4) hours as needed.      ??? AMITIZA 24 mcg capsule Take 24 mcg by mouth daily with breakfast.      ??? atorvastatin (LIPITOR) 40 MG tablet      ??? enzalutamide (XTANDI) 40 mg cap capsule Take 4 capsules (160 mg total) by mouth daily. 120 capsule 11   ??? food supplemt, lactose-reduced (ENSURE PLUS) 0.05-1.5 gram-kcal/mL liquid Take 237 mL by mouth daily.      ??? metoprolol tartrate (LOPRESSOR) 25 MG tablet Take 12.5 mg by mouth.     ??? mirtazapine (REMERON) 15 MG tablet Take 15 mg by mouth nightly.      ??? naloxone (NARCAN) 4 mg nasal spray One spray in either nostril once for known/suspected opioid overdose. May repeat every 2-3 minutes in alternating nostril til EMS arrives 1 each 0   ??? polyethylene glycol (MIRALAX) 17 gram packet Take 17 g by mouth daily.      ??? predniSONE (DELTASONE) 10 MG tablet      ??? prochlorperazine (COMPAZINE) 10 MG tablet Take 10 mg by mouth every eight (8) hours as needed.      ??? senna-docusate (PERICOLACE) 8.6-50 mg Take 1 tablet by mouth.     ??? sodium bicarbonate 650 mg tablet Take 650 mg by mouth.     ??? SUMAtriptan (IMITREX) 25 MG tablet      ??? tamsulosin (FLOMAX) 0.4 mg capsule Take 1 capsule (0.4 mg total) by mouth daily. 30 capsule 11   ??? valACYclovir (VALTREX) 1000 MG tablet Take 1 tablet (1,000 mg total) by mouth daily. 30 tablet 0   ??? betamethasone dipropionate (DIPROLENE) 0.05 % ointment Apply twice daily to affected areas as needed for itching (Patient not taking: Reported on 08/06/2017) 45 g 2   ??? diphenhydrAMINE (BENADRYL) 25 mg tablet Take 25 mg by mouth every six (6) hours as needed.      ???  doxycycline (VIBRA-TABS) 100 MG tablet Take 1 tablet (100 mg total) by mouth daily. (Patient not taking: Reported on 07/09/2017) 30 tablet 0   ??? fluocinolone 0.01 % external oil Bid to back (Patient not taking: Reported on 07/09/2017) 120 mL 2   ??? folic acid (FOLVITE) 1 MG tablet Take 1 mg by mouth.     ??? furosemide (LASIX) 20 MG tablet Take 20 mg by mouth.     ??? lactose-reduced food (ENSURE ENLIVE ORAL) Take 237 mL by mouth.     ??? levocetirizine (XYZAL) 5 MG tablet Take 5 mg by mouth.     ??? lidocaine-prilocaine (EMLA) cream Place on port site the morning of chemo     ??? magnesium oxide (MAG-OX) 400 mg tablet Take 400 mg by mouth.     ??? oxyCODONE (OXYCONTIN) 40 mg 12 hr crush resistant tablet Take 1 tablet (40 mg total) by mouth every twelve (12) hours. 60 tablet 0   ??? oxyCODONE-acetaminophen (PERCOCET) 10-325 mg per tablet Take 1-2 tablets by mouth every four (4) hours as needed for pain. 240 tablet 0   ??? pantoprazole (PROTONIX) 40 MG tablet Take 40 mg by mouth.     ??? triamcinolone (KENALOG) 0.1 % ointment Apply to arms twice a day until clear, then stop. (Patient not taking: Reported on 08/06/2017) 30 g 1     No current facility-administered medications for this visit.        I spent 10 minutes with Mr.Rupnow in direct patient care.     Laverna Peace PharmD, BCOP, CPP  Hematology/Oncology Pharmacist  P: [phone]

## 2017-08-06 NOTE — Unmapped (Signed)
IM injection administered per orders and protocol. Patient tolerated procedure.

## 2017-08-06 NOTE — Unmapped (Signed)
[facility] Visit Note    Patient Name: Bruce Parker  Patient Age: 56 y.o.  Encounter Date: 08/06/2017  Provider: Malachy Mood  Referring physician: Referring Unknown, Per *    Assessment  Patient Active Problem List   Diagnosis   ??? Derangement of right patella   ??? Hypertension, benign   ??? Osteoarthrosis   ??? Chronic renal disease, stage 3, moderately decreased glomerular filtration rate (GFR) between 30-59 mL/min/1.73 square meter (CMS-HCC)   ??? Lichen planus   ??? Weakness   ??? Lung mass   ??? Mediastinal lymphadenopathy   ??? Malignant neoplasm metastatic to bone (CMS-HCC)   ??? Pain of metastatic malignancy   ??? Non-small cell carcinoma of lung (CMS-HCC)   ??? Protein-calorie malnutrition (CMS-HCC)   ??? Acute respiratory failure (CMS-HCC)   ??? Shortness of breath   ??? Tobacco dependence syndrome   ??? Prostate cancer (CMS-HCC)     1. Metastatic castration resistant prostate cancer, with bone metastases  2. Stage IV nonsmall cell lung cancer, on secondline therapy with nivolumab s/p cycle #48 (q2w)    On CRPC therapy with enzalutamide.  Radium-223 infusion added for poor PSA response to enzalutamide.  It seems that after Radium 223 infusion was started, PSA stabilized and had not been increasing. Today's PSA shows slight increase, will need to trend to evaluate whether this is a harbinger for progressive disease.    While his pain is improved compared to prior visit, he is more fatigued during the visit. Unclear how much is secondary to analgesic regimen or poor nutrition and his underlying malignancies. His brother-in-law notes decrease PO intake and lack of any nutritional supplement at this time. MRI of spine done in 03/2017 did not show any epidural disease, just vertebral osseous mets and compression fracture. Therefore, while his PSA remains stable, pt is not at high risk of having spinal cord compression that's newly developed since MRI of 03/2017. It's likely pain represents symptoms of metastatic disease.  In terms of treatment options, I think his options are limited.  If disease progression is documented by multiple new lesions on bone scan or clearly progressive soft tissue disease on CT san while PSA is stable, I would be concerned about neuroendocrine tumor, which would require aggressive platinum-based chemo that this pt can't tolerate. Even if he had progressive adenocarcinoma, the only effective therapy would then be taxane chemo, which pt would have difficulty tolerating.  Another agent like abiraterone would not be effective.  I think it's best that we do palliative therapy, with continuing with Radium 223, vs palliative RT to the painful bone mets.    Plan  1. Pt should proceed with Radium 223, #6 out of 6 planned infusions.  -- Would consider palliative RT to the spine in the near future, especially if pain control is inadequate.  2. Continue enzalutamide dose to 160 mg qd (regular dose), since he is tolerating well.  Follow PSA.  -- As he is completing 6 infusions of Radium 223, his last NM Bone scan was 01/2017 with relatively recent CT CAP on 06/12/17 at [facility]. Will plan for repeating NM Bone scan before next appointment and if progressive disease, pt should be considered for hospice referral.  3. Lupron 22.5 mg IM, due today, 08/06/17, next due 11/04/16 or later  4.Following with palliative care with improvement in pain control following adjustments to analgesic regimen  5. Elevated LFT's being managed by Dr. Arbutus Ped.  In reviewing CareEverywhere, pt was screened  for hepatitis. Nivo was being held and he was treated with prednisone. Returning to their clinic tomorrow, patient anticipates receiving Nivo again  6. Pt's caregiver brought up the fact that [facility] is closer to pt than [facility] and I think it is a good idea for his lung cancer care to be transitioned to [facility]. Pt considering this transition and we'll make a referral to [facility] thoracic oncology. Pt had seen Dr. Eloise Harman in the past at [facility]. Pt is doing well from the lung cancer standpoint and had been on nivo long term.    Patient was seen and examined with supervising attending, Dr. Philomena Course.    Malachy Mood  Hematology/Oncology Fellow, PGY-4      Reason for Visit  Prostate cancer    History of Present Illness:  Oncology History    --In 04/2014, PSA elevated to 56.4. DRE normal.  Prostate bx scheduled, but canceled by pt.  --In 09/2014, diagnosed with nonsmall cell lung cancer, stage IV (T2b, N2, M1b) with a large R hilar mass, mediastinal adenopathy, mets to bones. No uptake in liver or pancreas on PET scan. Insufficient material for molecular testing.  Guardant360 for ctDNA was negative.  Pt was subsequently treated with carboplatin/Alimta with initial good response.  Then disease progression.  On second line therapy with nivolumab.  --In 01/2015, PSA 62.2.  Seen in Urology clinic and referred to [facility] for possibility of prostate cancer.  After discussions about various options, underwent bone bx in 05/2015, nondiagnostic. PSA down to 23.7  --In 07/2015, prostate bx showed Gleason 4+5=9 adenocarcinoma.  --In 08/2015, androgen deprivation therapy with Lupron started.  PSA 354.  --In 11/2015, Lupron continued. PSA down to 1.7, which was the nadir. Subsequently, PSA began rising.  --In 06/2016, PSA up to 32.  Enzalutamide started.  MRI of brain neg. Bone scan stable.  --In 02/2017, PSA rise on enzalutamide. Bone scan stable. Radium 223 added        Lung cancer, primary, with metastasis from lung to other site (CMS-HCC) (Resolved)    10/11/2014 Initial Diagnosis     Lung cancer, primary, with metastasis from lung to other site [facility])           Prostate cancer (CMS-HCC)    07/27/2015 Initial Diagnosis     Prostate cancer (RAF-HCC)          The patient returns for scheduled follow up.  He's accompanied by his brother-in-law as usual.  Noting while his pain is significantly improved, he is more somnolent. Lower energy. Reports an early morning which compounds his fatigue at this visit. Notes he has been trying to stay hydrated, however his PO intake has declined and his brother-in-law raises concern for poor nutrition. He has not been using additional supplements, e.g. Ensure. We reviewed alternatives he could consider which may have a different taste profile. Denies shortness of breath at rest, tolerating current O2 requirement. Denies cough, hemoptysis. Denies chest pain, abd pain, nausea/emesis/diarrhea/constipation.    Allergies:  No Known Allergies    Current Medications:    Current Outpatient Prescriptions:   ???  albuterol 2.5 mg /3 mL (0.083 %) nebulizer solution, Inhale 2.5 mg by nebulization every four (4) hours as needed. , Disp: , Rfl:   ???  AMITIZA 24 mcg capsule, Take 24 mcg by mouth daily with breakfast. , Disp: , Rfl:   ???  atorvastatin (LIPITOR) 40 MG tablet, , Disp: , Rfl:   ???  betamethasone dipropionate (DIPROLENE) 0.05 %  ointment, Apply twice daily to affected areas as needed for itching, Disp: 45 g, Rfl: 2  ???  diphenhydrAMINE (BENADRYL) 25 mg tablet, Take 25 mg by mouth every six (6) hours as needed. , Disp: , Rfl:   ???  doxycycline (VIBRA-TABS) 100 MG tablet, Take 1 tablet (100 mg total) by mouth daily. (Patient not taking: Reported on 07/09/2017), Disp: 30 tablet, Rfl: 0  ???  enzalutamide (XTANDI) 40 mg cap capsule, Take 4 capsules (160 mg total) by mouth daily., Disp: 120 capsule, Rfl: 11  ???  fluocinolone 0.01 % external oil, Bid to back (Patient not taking: Reported on 07/09/2017), Disp: 120 mL, Rfl: 2  ???  folic acid (FOLVITE) 1 MG tablet, Take 1 mg by mouth., Disp: , Rfl:   ???  food supplemt, lactose-reduced (ENSURE PLUS) 0.05-1.5 gram-kcal/mL liquid, Take 237 mL by mouth daily. , Disp: , Rfl:   ???  furosemide (LASIX) 20 MG tablet, Take 20 mg by mouth., Disp: , Rfl:   ???  lactose-reduced food (ENSURE ENLIVE ORAL), Take 237 mL by mouth., Disp: , Rfl:   ???  levocetirizine (XYZAL) 5 MG tablet, Take 5 mg by mouth., Disp: , Rfl: ???  lidocaine-prilocaine (EMLA) cream, Place on port site the morning of chemo, Disp: , Rfl:   ???  magnesium oxide (MAG-OX) 400 mg tablet, Take 400 mg by mouth., Disp: , Rfl:   ???  metoprolol tartrate (LOPRESSOR) 25 MG tablet, Take 12.5 mg by mouth., Disp: , Rfl:   ???  mirtazapine (REMERON) 15 MG tablet, Take 15 mg by mouth nightly. , Disp: , Rfl:   ???  naloxone (NARCAN) 4 mg nasal spray, One spray in either nostril once for known/suspected opioid overdose. May repeat every 2-3 minutes in alternating nostril til EMS arrives, Disp: 1 each, Rfl: 0  ???  oxyCODONE (OXYCONTIN) 40 mg 12 hr crush resistant tablet, Take 1 tablet (40 mg total) by mouth every twelve (12) hours., Disp: 30 tablet, Rfl: 0  ???  oxyCODONE-acetaminophen (PERCOCET) 10-325 mg per tablet, Take 1-2 tablets by mouth every four (4) hours as needed for pain., Disp: 120 tablet, Rfl: 0  ???  pantoprazole (PROTONIX) 40 MG tablet, Take 40 mg by mouth., Disp: , Rfl:   ???  polyethylene glycol (MIRALAX) 17 gram packet, Take 17 g by mouth daily. , Disp: , Rfl:   ???  predniSONE (DELTASONE) 10 MG tablet, , Disp: , Rfl:   ???  prochlorperazine (COMPAZINE) 10 MG tablet, Take 10 mg by mouth every eight (8) hours as needed. , Disp: , Rfl:   ???  senna-docusate (PERICOLACE) 8.6-50 mg, Take 1 tablet by mouth., Disp: , Rfl:   ???  sodium bicarbonate 650 mg tablet, Take 650 mg by mouth., Disp: , Rfl:   ???  SUMAtriptan (IMITREX) 25 MG tablet, , Disp: , Rfl:   ???  tamsulosin (FLOMAX) 0.4 mg capsule, Take 1 capsule (0.4 mg total) by mouth daily., Disp: 30 capsule, Rfl: 11  ???  triamcinolone (KENALOG) 0.1 % ointment, Apply to arms twice a day until clear, then stop. (Patient taking differently: Apply 1 application topically two (2) times a day as needed. ), Disp: 30 g, Rfl: 1  ???  valACYclovir (VALTREX) 1000 MG tablet, Take 1 tablet (1,000 mg total) by mouth daily., Disp: 30 tablet, Rfl: 0    Past Medical History and Social History  Past Medical History:   Diagnosis Date   ??? Lung cancer (CMS-HCC) ??? On supplemental oxygen therapy  Past Surgical History:   Procedure Laterality Date   ??? APPENDECTOMY     ??? PR BRNCHSC EBUS GUIDED SAMPL 3/> NODE STATION/STRUX N/A 05/18/2015    Procedure: Bronch, Rigid Or Flexible, Including Fluoro Guidance, When Performed; W Ebus Guided Transtracheal And/Or Transbronchial Sampling, 3 Or More Mediastinal And/Or Hilar Lymph Node Stations Or Structures;  Surgeon: Mathis Bud, MD;  Location: MAIN OR [facility];  Service: Pulmonary   ??? small intestines removed per pt          Social History     Occupational History   ??? Not on file.     Social History Main Topics   ??? Smoking status: Former Smoker     Packs/day: 1.00     Years: 30.00     Types: Cigarettes     Quit date: 05/15/2014   ??? Smokeless tobacco: Never Used   ??? Alcohol use No   ??? Drug use: No   ??? Sexual activity: Not on file   Pt is accompanied by his brother-in-law, who usually brings him to clinic    Family History  Brother had lung cancer.    Review of Systems:  A comprehensive review of 10 systems was negative except for pertinent positives noted in HPI.    Physical Exam:    VITAL SIGNS:  BP 122/78  - Temp 36.4 ??C (97.5 ??F) (Oral)  - Resp 16  - Ht 175.3 cm (5' 9)  - Wt 76.7 kg (169 lb)  - SpO2 94%  - BMI 24.96 kg/m??   ECOG Performance Status: 2  GENERAL: Chronically ill-appearing, difficulty sitting up straight  HEAD: Normocephalic and atraumatic.  EYES: Conjunctivae are normal. No scleral icterus.  MOUTH/THROAT: Oropharynx is clear and moist.  No mucosal lesions.  NECK: Supple, no thyromegaly.  LYMPHATICS: No palpable cervical, supraclavicular, or axillary adenopathy.  CARDIOVASCULAR: Normal rate, regular rhythm and normal heart sounds.  Exam reveals no gallop and no friction rub.  No murmur heard.  PULMONARY/CHEST: Posterior inferior bibasilar crackles  ABDOMINAL:  Soft. There is no distension. There is no tenderness. There is no rebound and no guarding.  MUSCULOSKELETAL: No clubbing, cyanosis, or lower extremity edema. PSYCHIATRIC: Alert and oriented.  Normal mood and affect.  NEUROLOGIC: No focal motor deficit. Gait not observed.  SKIN: Skin is warm, dry, and intact.      Results/Orders:    Lab on 08/06/2017   Component Date Value Ref Range Status   ??? PSA 08/06/2017 70.80* 0.00 - 4.00 ng/mL Final   ??? Sodium 08/06/2017 139  135 - 145 mmol/L Final   ??? Potassium 08/06/2017 4.7  3.5 - 5.0 mmol/L Final   ??? Chloride 08/06/2017 108* 98 - 107 mmol/L Final   ??? CO2 08/06/2017 22.0  22.0 - 30.0 mmol/L Final   ??? BUN 08/06/2017 35* 7 - 21 mg/dL Final   ??? Creatinine 08/06/2017 2.21* 0.70 - 1.30 mg/dL Final   ??? BUN/Creatinine Ratio 08/06/2017 16   Final   ??? EGFR MDRD Non Af Amer 08/06/2017 31* >=60 mL/min/1.48m2 Final   ??? EGFR MDRD Af Amer 08/06/2017 37* >=60 mL/min/1.2m2 Final   ??? Anion Gap 08/06/2017 9  9 - 15 mmol/L Final   ??? Glucose 08/06/2017 99  65 - 179 mg/dL Final   ??? Calcium 16/05/9603 9.5  8.5 - 10.2 mg/dL Final   ??? Albumin 54/04/8118 3.3* 3.5 - 5.0 g/dL Final   ??? Total Protein 08/06/2017 5.8* 6.5 - 8.3 g/dL Final   ??? Total Bilirubin 08/06/2017  0.8  0.0 - 1.2 mg/dL Final   ??? AST 16/05/9603 70* 19 - 55 U/L Final   ??? ALT 08/06/2017 60  19 - 72 U/L Final   ??? Alkaline Phosphatase 08/06/2017 315* 38 - 126 U/L Final   ??? Phosphorus 08/06/2017 4.0  2.9 - 4.7 mg/dL Final   ??? WBC 54/04/8118 7.1  4.5 - 11.0 10*9/L Final   ??? RBC 08/06/2017 3.82* 4.50 - 5.90 10*12/L Final   ??? HGB 08/06/2017 13.0* 13.5 - 17.5 g/dL Final   ??? HCT 14/78/2956 39.1* 41.0 - 53.0 % Final   ??? MCV 08/06/2017 102.5* 80.0 - 100.0 fL Final   ??? MCH 08/06/2017 34.0  26.0 - 34.0 pg Final   ??? MCHC 08/06/2017 33.2  31.0 - 37.0 g/dL Final   ??? RDW 21/30/8657 17.0* 12.0 - 15.0 % Final   ??? MPV 08/06/2017 8.2  7.0 - 10.0 fL Final   ??? Platelet 08/06/2017 227  150 - 440 10*9/L Final   ??? Absolute Neutrophils 08/06/2017 5.0  2.0 - 7.5 10*9/L Final   ??? Absolute Lymphocytes 08/06/2017 0.9* 1.5 - 5.0 10*9/L Final   ??? Absolute Monocytes 08/06/2017 0.8  0.2 - 0.8 10*9/L Final   ??? Absolute Eosinophils 08/06/2017 0.1  0.0 - 0.4 10*9/L Final   ??? Absolute Basophils 08/06/2017 0.0  0.0 - 0.1 10*9/L Final   ??? Large Unstained Cells 08/06/2017 4  0 - 4 % Final   ??? Macrocytosis 08/06/2017 Marked* Not Present Final   ??? Anisocytosis 08/06/2017 Slight* Not Present Final   ??? Smear Review Comments 08/06/2017 See Comment* Undefined Final    Slide reviewed         PSA   Date Value Ref Range Status   08/06/2017 70.80 (H) 0.00 - 4.00 ng/mL Final   07/09/2017 62.50 (H) 0.00 - 4.00 ng/mL Final   07/02/2017 61.10 (H) 0.00 - 4.00 ng/mL Final   05/21/2017 60.50 (H) 0.00 - 4.00 ng/mL Final   04/23/2017 64.20 (H) 0.00 - 4.00 ng/mL Final   03/26/2017 50.70 (H) 0.00 - 4.00 ng/mL Final   02/21/2017 67.60 (H) 0.00 - 4.00 ng/mL Final   01/10/2017 60.80 (H) 0.00 - 4.00 ng/mL Final   10/11/2016 28.10 (H) 0.00 - 4.00 ng/mL Final   08/30/2016 24.90 (H) 0.00 - 4.00 ng/mL Final   Baseline PSA prior to enzalutamide is 32.6 on 07/19/2016      Testosterone   Date Value Ref Range Status   06/21/2016 10 (L) 179 - 756 ng/dL Final   84/69/6295 284 (L) 179 - 756 ng/dL Final   13/24/4010 84 (L) 179 - 756 ng/dL Final              Orders placed or performed during the hospital encounter of 08/06/17   ??? NM Therapy Radiopharm Intra-Venous   ??? NM Therapy Radiopharm Intra-Venous     Pathology:  Final Diagnosis    ?? A:?? Prostate,?? Right base, needle biopsy ??  - Prostatic adenocarcinoma, Gleason score 4 + 3 = 7 (Grade group 3) involving 2 of 2 cores, approximately 12 and 12 mm in discontinuous linear extent, approximately 80% of total core length.    B:?? Prostate,?? Right mid, needle biopsy   - Prostatic adenocarcinoma, Gleason score 4 + 3 = 7 (Grade group 3) involving 2 of 2 cores, approximately 11 and 7 mm in discontinuous linear extent, approximately 70% of total core length.    C:?? Prostate,?? Right apex, needle biopsy   - Prostatic adenocarcinoma, Gleason  score 3 + 5 = 8 (Grade group 4) involving 2 of 2 cores, approximately 9 and 14 mm in discontinuous linear extent, approximately 80% of total core length. ??  - Perineural invasion identified in this case.    D:?? Prostate,?? Left base, needle biopsy   - Prostatic adenocarcinoma, Gleason score 4 + 5 = 9 (Grade group 5) involving 2 of 2 cores, approximately 13 and 13 mm in discontinuous linear extent, approximately 90% of total core length.     E:?? Prostate,?? Left mid, needle biopsy   - Prostatic adenocarcinoma, Gleason score 4 + 4 = 8 (Grade group 4) involving 2 of 2 cores, approximately 5 and 15 mm in discontinuous linear extent, approximately 70% of total core length.     F:?? Prostate,?? Left apex, needle biopsy   - Prostatic adenocarcinoma, Gleason score 4 + 5 = 9 (Grade group 5) involving 2 of 2 cores, approximately 9 and 11 mm in linear extent, approximately 90% of total core length. ??  - Suspicious for extraprostatic extension         Imaging results:  PET scan in 10/15/2014  IMPRESSION:  Markedly hypermetabolic right paratracheal lesion, consistent with  the patient's known malignancy. This is associated with  hypermetabolic metastases in the right hilum and scattered sclerotic  hypermetabolic bone metastases.    The cystic lesion in the pancreatic head is not hypermetabolic on  PET imaging. As such, this finding may not be related to metastatic  disease. Attention on followup imaging is suggested.    Mild FDG uptake in both adrenal glands with asymmetric appearance  and no underlying nodule or mass. Uptake felt to be physiologic, but  attention to these areas on follow-up is also recommended.    No evidence for hypermetabolic disease in the liver on today's  Study.    MRI of brain 08/30/2016  No evidence of intracranial metastasis      Bone scan 08/30/2016  Diffuse osseous metastatic disease, predominantly throughout the axial skeleton.    Bone scan 02/07/2017  Impression    Diffuse osseous metastatic disease predominantly in the axial skeleton, similar to prior. The only obvious new focus is in the right posterior ninth rib    X ray of femur R 02/21/2017  Impression      1. Unchanged severe right hip osteoarthrosis likely secondary to femoral head collapse related to osteonecrosis.    2. Likely distal femoral bone infarct. Otherwise, no focal lytic or blastic bone lesion.        MRI T/L Spine (performed at [facility] 03/2017)  Diffuse osseous mets, possible T10 pathologic fracture without height loss. No canal stenosis. Mild L3-4, L4-5 neural foraminal narrowing

## 2017-08-06 NOTE — Unmapped (Signed)
..  Patient labs drawn and sent for analysis. Care per [facility] RN

## 2017-08-06 NOTE — Unmapped (Addendum)
Lab Results   Component Value Date    PSA 70.80 (H) 08/06/2017    PSA 62.50 (H) 07/09/2017    PSA 61.10 (H) 07/02/2017    PSA 60.50 (H) 05/21/2017    PSA 64.20 (H) 04/23/2017    PSA 50.70 (H) 03/26/2017     Please call [phone] to reach my nurse navigator Mauricia Area for any issues.    For emergencies on Nights, Weekends and Holidays  Call [phone] and ask for the hematology/oncology on call.    Griffin Basil, MD, PhD  Associate Professor of Medicine  Division of Hematology-Oncology    [facility]  Genitourinary Oncology Clinic  Nurse Navigator: Mauricia Area  Fax: [phone]

## 2017-08-07 ENCOUNTER — Encounter: Payer: Self-pay | Admitting: Oncology

## 2017-08-07 ENCOUNTER — Ambulatory Visit (HOSPITAL_BASED_OUTPATIENT_CLINIC_OR_DEPARTMENT_OTHER): Payer: Medicare Other | Admitting: Oncology

## 2017-08-07 ENCOUNTER — Encounter: Payer: Self-pay | Admitting: *Deleted

## 2017-08-07 ENCOUNTER — Other Ambulatory Visit (HOSPITAL_BASED_OUTPATIENT_CLINIC_OR_DEPARTMENT_OTHER): Payer: Medicare Other

## 2017-08-07 ENCOUNTER — Ambulatory Visit (HOSPITAL_BASED_OUTPATIENT_CLINIC_OR_DEPARTMENT_OTHER): Payer: Medicare Other

## 2017-08-07 VITALS — BP 95/62 | HR 92 | Temp 98.3°F | Resp 18 | Ht 69.0 in | Wt 169.0 lb

## 2017-08-07 DIAGNOSIS — C7889 Secondary malignant neoplasm of other digestive organs: Secondary | ICD-10-CM | POA: Diagnosis not present

## 2017-08-07 DIAGNOSIS — C7951 Secondary malignant neoplasm of bone: Secondary | ICD-10-CM

## 2017-08-07 DIAGNOSIS — Z5112 Encounter for antineoplastic immunotherapy: Secondary | ICD-10-CM

## 2017-08-07 DIAGNOSIS — C787 Secondary malignant neoplasm of liver and intrahepatic bile duct: Secondary | ICD-10-CM

## 2017-08-07 DIAGNOSIS — C3491 Malignant neoplasm of unspecified part of right bronchus or lung: Secondary | ICD-10-CM

## 2017-08-07 DIAGNOSIS — C3401 Malignant neoplasm of right main bronchus: Secondary | ICD-10-CM

## 2017-08-07 DIAGNOSIS — C801 Malignant (primary) neoplasm, unspecified: Principal | ICD-10-CM

## 2017-08-07 DIAGNOSIS — G893 Neoplasm related pain (acute) (chronic): Secondary | ICD-10-CM

## 2017-08-07 LAB — CBC WITH DIFFERENTIAL/PLATELET
BASO%: 0.6 % (ref 0.0–2.0)
Basophils Absolute: 0 10*3/uL (ref 0.0–0.1)
EOS%: 1.4 % (ref 0.0–7.0)
Eosinophils Absolute: 0.1 10*3/uL (ref 0.0–0.5)
HCT: 36.1 % — ABNORMAL LOW (ref 38.4–49.9)
HGB: 11.9 g/dL — ABNORMAL LOW (ref 13.0–17.1)
LYMPH%: 13.9 % — AB (ref 14.0–49.0)
MCH: 33.5 pg — ABNORMAL HIGH (ref 27.2–33.4)
MCHC: 32.8 g/dL (ref 32.0–36.0)
MCV: 102 fL — ABNORMAL HIGH (ref 79.3–98.0)
MONO#: 1.2 10*3/uL — ABNORMAL HIGH (ref 0.1–0.9)
MONO%: 17.3 % — ABNORMAL HIGH (ref 0.0–14.0)
NEUT%: 66.8 % (ref 39.0–75.0)
NEUTROS ABS: 4.6 10*3/uL (ref 1.5–6.5)
Platelets: 227 10*3/uL (ref 140–400)
RBC: 3.54 10*6/uL — AB (ref 4.20–5.82)
RDW: 15.6 % — ABNORMAL HIGH (ref 11.0–14.6)
WBC: 6.9 10*3/uL (ref 4.0–10.3)
lymph#: 1 10*3/uL (ref 0.9–3.3)

## 2017-08-07 LAB — COMPREHENSIVE METABOLIC PANEL
ALT: 40 U/L (ref 0–55)
ANION GAP: 11 meq/L (ref 3–11)
AST: 66 U/L — AB (ref 5–34)
Albumin: 2.5 g/dL — ABNORMAL LOW (ref 3.5–5.0)
Alkaline Phosphatase: 362 U/L — ABNORMAL HIGH (ref 40–150)
BUN: 35.7 mg/dL — AB (ref 7.0–26.0)
CHLORIDE: 112 meq/L — AB (ref 98–109)
CO2: 18 meq/L — AB (ref 22–29)
CREATININE: 2.3 mg/dL — AB (ref 0.7–1.3)
Calcium: 8.6 mg/dL (ref 8.4–10.4)
EGFR: 36 mL/min/{1.73_m2} — ABNORMAL LOW (ref >60)
Glucose: 104 mg/dl (ref 70–140)
Potassium: 4 mEq/L (ref 3.5–5.1)
Sodium: 141 mEq/L (ref 136–145)
TOTAL PROTEIN: 6.1 g/dL — AB (ref 6.4–8.3)
Total Bilirubin: 0.5 mg/dL (ref 0.20–1.20)

## 2017-08-07 MED ORDER — SODIUM CHLORIDE 0.9 % IV SOLN
240.0000 mg | Freq: Once | INTRAVENOUS | Status: AC
  Administered 2017-08-07: 240 mg via INTRAVENOUS
  Filled 2017-08-07: qty 24

## 2017-08-07 MED ORDER — SODIUM CHLORIDE 0.9% FLUSH
10.0000 mL | INTRAVENOUS | Status: DC | PRN
  Administered 2017-08-07: 10 mL
  Filled 2017-08-07: qty 10

## 2017-08-07 MED ORDER — HEPARIN SOD (PORK) LOCK FLUSH 100 UNIT/ML IV SOLN
500.0000 [IU] | Freq: Once | INTRAVENOUS | Status: AC | PRN
  Administered 2017-08-07: 500 [IU]
  Filled 2017-08-07: qty 5

## 2017-08-07 MED ORDER — SODIUM CHLORIDE 0.9 % IV SOLN
Freq: Once | INTRAVENOUS | Status: AC
  Administered 2017-08-07: 12:00:00 via INTRAVENOUS

## 2017-08-07 NOTE — Assessment & Plan Note (Signed)
This is a very pleasant 56 year old African-American male with metastatic non-small cell lung cancer, adenocarcinoma status post induction systemic chemotherapy was carboplatin and Alimta. He was started on second line treatment with Nivolumab 240 mg IV every 2 weeks is status post 48 cycles. He then received treatment with Nivolumab 480 MG IV every 4 weeks is status post 1 cycle. This treatment with the high dose Nivolumab was discontinued secondary to immunotherapy mediated hepatitis. The patient was a started on high-dose prednisone that has been tapered over the last several weeks.  He has now completed his prednisone.  The patient the patient tolerated his first cycle of Nivolumab 240 mg every 2 weeks fairly well. His comprehensive metabolic panel today showed  stable liver enzymes.  We will continue to watch these every 2 weeks. The patient will proceed with cycle 2 of Nivolumab 240 mg given every 2 weeks.  Okay to treat the patient with a creatinine of 2.4 which is his baseline.  The patient will return in 2 weeks for evaluation prior to cycle 3 of Nivolumab.  For pain management he will continue his current pain medication by the pain clinic at [facility].  The patient was advised to call immediately if he has any concerning symptoms in the interval. The patient voices understanding of current disease status and treatment options and is in agreement with the current care plan. All questions were answered. The patient knows to call the clinic with any problems, questions or concerns. We can certainly see the patient much sooner if necessary.

## 2017-08-07 NOTE — Progress Notes (Signed)
[place] OFFICE PROGRESS NOTE  Francisco Bears, MD [address] [place] [address]  DIAGNOSIS:  1) Stage IV (T2b, N2, M1b) non-small cell lung cancer, adenocarcinoma with negative EGFR mutation and negative gene translocation presented with a large right hilar mass as well as mediastinal lymphadenopathy and metastatic disease to the liver, bone and pancreas diagnosed in February 2016. 2) prostate adenocarcinoma diagnosed at [facility] with Gleason score 9 (4+5). 3) treatment with Radium 223 for prostate cancer at [facility] on 02/21/2017  PRIOR THERAPY: 1) Systemic chemotherapy with carboplatin for AUC of 5 and Alimta 500 MG/M2 every 3 weeks. Status post 6 cycles. 2) Enzalutamide for prostate cancer at [facility] 3) Immunotherapy with Nivolumab 240 MG every 2 weeks, status post 48 cycles. 4) Radium 223 monthly at [facility], S/p 2 cycles. 5)Immunotherapy with Nivolumab 480 MG every 4 weeks, first cycle 04/10/2017. 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  CURRENT THERAPY: Resuming immunotherapy with Nivolumab240 mg IV every 2 weeks.First dose 07/25/2017.  Status post 1 cycle.  INTERVAL HISTORY: Francisco Love 56 y.o. male returns for routine follow-up visit accompanied by his brother-in-law.  The patient is feeling fine today and has no specific complaints.  He reports that his appetite is improving and he is started drinking Ensure.  He has lost weight likely due to coming off of his prednisone recently.  Weight is consistent with his pre-steroid weight.  The patient denies fevers and chills.  Denies chest pain, shortness of breath, cough, hemoptysis.  Denies nausea, vomiting, constipation, diarrhea.  The patient is here for evaluation prior to cycle #2 of his Nivolumab.  MEDICAL HISTORY: Past Medical History:  Diagnosis Date  . Chronic fatigue 04/12/2016  . Chronic pain 04/12/2016  .  Chronic renal disease, stage III ([place])   . Metastatic cancer ([place]) dx'd 09/2014    ALLERGIES:  has No Known Allergies.  MEDICATIONS:  Current Outpatient Medications  Medication Sig Dispense Refill  . albuterol (PROVENTIL) (2.5 MG/3ML) 0.083% nebulizer solution Take 3 mLs (2.5 mg total) by nebulization every 2 (two) hours as needed for wheezing. 75 mL 12  . AMITIZA 24 MCG capsule Take 24 mcg by mouth 2 (two) times daily with a meal.     . atorvastatin (LIPITOR) 40 MG tablet Take 40 mg by mouth daily.    . betamethasone dipropionate (DIPROLENE) 0.05 % ointment Apply twice daily to affected areas as needed for itching    . bicalutamide (CASODEX) 50 MG tablet Take 1 tablet (50 mg total) by mouth daily. HOLD UNTIL SEEN BY ONCOLOGY.    Marland Kitchen doxycycline (DORYX) 100 MG EC tablet Take 100 mg by mouth 2 (two) times daily.    . folic acid (FOLVITE) 1 MG tablet Take 1 mg by mouth daily.    . furosemide (LASIX) 20 MG tablet Take 1 tablet (20 mg total) by mouth daily as needed for fluid or edema. 30 tablet 0  . lidocaine-prilocaine (EMLA) cream Apply 1 application topically as needed. Apply to port site prior to chemotherapy. 30 g 0  . magnesium oxide (MAG-OX) 400 MG tablet Take 400 mg by mouth daily.    . mirtazapine (REMERON) 15 MG tablet Take 1 tablet (15 mg total) by mouth at bedtime.    . naloxone (NARCAN) nasal spray 4 mg/0.1 mL Place 1 spray into the nose once.    . Nutritional Supplements (ENSURE ACTIVE HIGH PROTEIN PO) Take 1 Can by mouth daily  as needed.    Marland Kitchen oxyCODONE (OXY IR/ROXICODONE) 5 MG immediate release tablet Take 1 tablet (5 mg total) by mouth every 4 (four) hours as needed for severe pain. 60 tablet 0  . oxyCODONE (OXYCONTIN) 40 mg 12 hr tablet Take 40 mg by mouth every 12 (twelve) hours.    Marland Kitchen oxyCODONE-acetaminophen (PERCOCET) 10-325 MG tablet Take 1-2 tablets by mouth every 4 (four) hours as needed.    . polyethylene glycol (MIRALAX / GLYCOLAX) packet Take 17 g by mouth daily as needed  for mild constipation. (Patient not taking: Reported on 07/25/2017) 14 each 0  . predniSONE (DELTASONE) 20 MG tablet Take 20 mg by mouth daily with breakfast.    . prochlorperazine (COMPAZINE) 10 MG tablet Take 1 tablet (10 mg total) by mouth every 6 (six) hours as needed for nausea or vomiting. 30 tablet 1  . senna-docusate (SENOKOT-S) 8.6-50 MG per tablet Take 1 tablet by mouth daily. (Patient not taking: Reported on 07/25/2017)    . sodium bicarbonate 650 MG tablet Take 1 tablet (650 mg total) by mouth daily. 30 tablet 0  . tamsulosin (FLOMAX) 0.4 MG CAPS capsule Take 0.4 mg by mouth daily.     . valACYclovir (VALTREX) 500 MG tablet Take 500 mg by mouth 2 (two) times daily.    [name] 40 MG capsule Take 160 mg by mouth every evening.      No current facility-administered medications for this visit.     SURGICAL HISTORY:  Past Surgical History:  Procedure Laterality Date  . APPENDECTOMY    . EUS N/A 03/28/2017   Procedure: UPPER ENDOSCOPIC ULTRASOUND (EUS) RADIAL;  Surgeon: Milus Banister, MD;  Location: [facility];  Service: Endoscopy;  Laterality: N/A;  . VIDEO BRONCHOSCOPY N/A 09/21/2014   Procedure: VIDEO BRONCHOSCOPY WITH FLUORO;  Surgeon: Rigoberto Noel, MD;  Location: [place];  Service: Cardiopulmonary;  Laterality: N/A;  . VIDEO BRONCHOSCOPY WITH ENDOBRONCHIAL ULTRASOUND N/A 09/23/2014   Procedure: VIDEO BRONCHOSCOPY WITH ENDOBRONCHIAL ULTRASOUND;  Surgeon: Rigoberto Noel, MD;  Location: [place];  Service: Pulmonary;  Laterality: N/A;    REVIEW OF SYSTEMS:   Review of Systems  Constitutional: Negative for appetite change, chills, fatigue, fever.  HENT:   Negative for mouth sores, nosebleeds, sore throat and trouble swallowing.   Eyes: Negative for eye problems and icterus.  Respiratory: Negative for cough, hemoptysis, shortness of breath at rest and wheezing.   Cardiovascular: Negative for chest pain and leg swelling.  Gastrointestinal: Negative for abdominal pain,  constipation, diarrhea, nausea and vomiting.  Genitourinary: Negative for bladder incontinence, difficulty urinating, dysuria, frequency and hematuria.   Musculoskeletal: Negative for back pain, gait problem, neck pain and neck stiffness.  Skin: Negative for itching and rash.  Neurological: Negative for dizziness, extremity weakness, gait problem, headaches, light-headedness and seizures.  Hematological: Negative for adenopathy. Does not bruise/bleed easily.  Psychiatric/Behavioral: Negative for confusion, depression and sleep disturbance. The patient is not nervous/anxious.     PHYSICAL EXAMINATION:  Blood pressure 95/62, pulse 92, temperature 98.3 F (36.8 C), temperature source Oral, resp. rate 18, height 5' 9"  (1.753 m), weight 169 lb (76.7 kg), SpO2 97 %.  ECOG PERFORMANCE STATUS: 1 - Symptomatic but completely ambulatory  Physical Exam  Constitutional: Oriented to person, place, and time and well-developed, well-nourished, and in no distress. No distress.  HENT:  Head: Normocephalic and atraumatic.  Mouth/Throat: Oropharynx is clear and moist. No oropharyngeal exudate.  Eyes: Conjunctivae are normal. Right eye exhibits no discharge. Left  eye exhibits no discharge. No scleral icterus.  Neck: Normal range of motion. Neck supple.  Cardiovascular: Normal rate, regular rhythm, normal heart sounds and intact distal pulses.   Pulmonary/Chest: Effort normal and breath sounds normal. No respiratory distress. No wheezes. No rales.  Abdominal: Soft. Bowel sounds are normal. Exhibits no distension and no mass. There is no tenderness.  Musculoskeletal: Normal range of motion. Exhibits no edema.  Lymphadenopathy:    No cervical adenopathy.  Neurological: Alert and oriented to person, place, and time. Exhibits normal muscle tone. Gait normal. Coordination normal.  Skin: Skin is warm and dry. No rash noted. Not diaphoretic. No erythema. No pallor.  Psychiatric: Mood, memory and judgment normal.   Vitals reviewed.  LABORATORY DATA: Lab Results  Component Value Date   WBC 6.9 08/07/2017   HGB 11.9 (L) 08/07/2017   HCT 36.1 (L) 08/07/2017   MCV 102.0 (H) 08/07/2017   PLT 227 08/07/2017      Chemistry      Component Value Date/Time   NA 141 08/07/2017 1007   K 4.0 08/07/2017 1007   CL 114 (H) 05/18/2017 0500   CO2 18 (L) 08/07/2017 1007   BUN 35.7 (H) 08/07/2017 1007   CREATININE 2.3 (H) 08/07/2017 1007      Component Value Date/Time   CALCIUM 8.6 08/07/2017 1007   ALKPHOS 362 (H) 08/07/2017 1007   AST 66 (H) 08/07/2017 1007   ALT 40 08/07/2017 1007   BILITOT 0.50 08/07/2017 1007       RADIOGRAPHIC STUDIES:  No results found.   ASSESSMENT/PLAN:  Non-small cell cancer of right lung [facility]) This is a very pleasant 56 year old African-American male with metastatic non-small cell lung cancer, adenocarcinoma status post induction systemic chemotherapy was carboplatin and Alimta. He was started on second line treatment with Nivolumab 240 mg IV every 2 weeks is status post 48 cycles. He then received treatment with Nivolumab 480 MG IV every 4 weeks is status post 1 cycle. This treatment with the high dose Nivolumab was discontinued secondary to immunotherapy mediated hepatitis. The patient was a started on high-dose prednisone that has been tapered over the last several weeks.  He has now completed his prednisone.  The patient the patient tolerated his first cycle of Nivolumab 240 mg every 2 weeks fairly well. His comprehensive metabolic panel today showed  stable liver enzymes.  We will continue to watch these every 2 weeks. The patient will proceed with cycle 2 of Nivolumab 240 mg given every 2 weeks.  Okay to treat the patient with a creatinine of 2.4 which is his baseline.  The patient will return in 2 weeks for evaluation prior to cycle 3 of Nivolumab.  For pain management he will continue his current pain medication by the pain clinic at [facility].  The  patient was advised to call immediately if he has any concerning symptoms in the interval. The patient voices understanding of current disease status and treatment options and is in agreement with the current care plan. All questions were answered. The patient knows to call the clinic with any problems, questions or concerns. We can certainly see the patient much sooner if necessary.  No orders of the defined types were placed in this encounter.  Mikey Bussing, DNP, AGPCNP-BC, AOCNP 08/07/17

## 2017-08-15 NOTE — Unmapped (Signed)
Hem/Onc Phone Triage Note    Caller: ED at [facility] - Dr. Elmarie Shiley    Reason for Call:   Bruce Parker is a 56 y.o. the known diagnosis of castrate resistant prostate cancer currently on radium 223 and enzalutamide.  He attended the ED with a 5-day history with ongoing bony pains, lethargy and malaise.  He was tachycardic on arrival and on clinical examination had crackles at the right base.  His labs were very similar to baseline from recently at [facility], with a normal lactate.  The pain is more diffuse and nonfocal compared to the description given in clinic most recently.    The patient had a good clinical response to IV opiates and in discussion with his family would like to remain local for ongoing improved symptom control.  Given the chest symptoms the patient was started on Zosyn.    Assessment/Plan:   1) [facility] on palliative radium 223 -the patient will be admitted locally for escalation of analgesic regimen.   -The team likely have a palliative care option and will liaise with them during this admission.  I will forward this to Mr. [facility] outpatient oncologist here at [facility].      Please page Oncology Consults at [phone] if patient needs admission or questions about care occur.     Fellow Taking Call:  Benay Pike  August 14, 2017 8:44 PM

## 2017-08-19 NOTE — Unmapped (Signed)
OUTPATIENT ONCOLOGY PALLIATIVE CARE    Principal Diagnosis: Bruce Parker is a 56 y/o M with unfortunately two known metastatic cancers including metastatic lung cancer dx 2/16 on nivolumab and metastatic prostate cancer dx 12/16 with known bony dx and T10 pathologic fx castrate resistant now on Radium 223 and enzalutamide who is referred to Palliative Care Clinic for help with ongoing pain/symptom management and GOC discussion.     Assessment/Plan:   1.  Malignant pain: primarily lower back, L posterior hip. Pain is stable and opioid usage is stable. Sister and brother-in-law are assisting with managing opioids due to patient's low health literacy and prior confusion over proper opioid usage. Since they have been more involved, his pain has been better controlled and opioid counts have been appropriate.   - Continue Oxycontin 40 mg po BID   - Continue oxycodone/APAP 10/325 - 1-2 tabs q4h prn. Max of 8 tabs/day. Instructed patient to not go above 2 tablets/dose    2. Goals of Care/Advanced Care Planning  Previously Discussed with Bruce Parker out role in support him during this journey. He expressed sadness about his prognosis but also hope that he actually has many years left. His goals right now are to live as long as he can, but also enjoy the little things like visiting with family, sitting in the outdoors and staying independent as much as he can. He would like his sister, [name], to be his HCPOA but has not formally filled out the paperwork yet. He does not want to be a burden on his family and if he becomes dependent for his ADLs he would like to go to a nursing home as he does not want them to have to take care of him. When he dies he would like to go peacefully in his sleep if possible. He has a strong faith and believes Jesus gives him only what he can handle and knows when it is his time. Does not have any formal Ads in place.        Plan:   -- continue to discuss GOC at each visit, today we mostly explored his current feelings and supported his hopes for the future. However I do worry his time is shorter than he would like and will discuss further GOC as we get to know him better   -- refer to patient relations for completion of formal Ads, would like sister [name] to be HCPOA    3. Controlled substances risk management.   - Patient has a signed pain medication agreement with Outpatient Palliative Care, completed on 05/21/17, as per standard of care.   - [place]CCS[place] database was reviewed today and it was appropriate.    - Urine drug screen was performed 05/21/17. Findings: positive for oxymorphone only (metabolite of oxycodone)   - Patient has received information about safe storage and administration of medications.   - Patient has received a prescription for narcan; Brother-in-law was educated on its use    F/u: 4 weeks in conjunction with visit with Dr. Philomena Course  ----------------------------------------  Referring Oncology Provider: Dr. Philomena Course (Prostate Cancer provider) and Dr. Sofie Hartigan (lung cancer provider)  PCP: Lonie Peak, [facility]      HPI:  Bruce Parker is a 56 y/o M with unfortunately two known metastatic cancers including metastatic lung cancer dx 2/16 on nivolumab and metastatic prostate cancer dx 12/16 with known bony dx and T10 pathologic fx castrate resistant now on Radium 223 and enzalutamide who is referred to Palliative Care Clinic for help  with ongoing pain/symptom management and GOC discussion. Recently his PSA has increased from 50.7 to 64.2 and prostate cancer physician believes he has prognosis of 1 month to 1 year but continuing radium 223 infusion, enzalutamide and lupron    Current cancer-directed therapy: Currently receiving Q2 week nivolumab , radium 223, lupron and enzalutamide    Interval history, 12/18 MDK:   - General: here today with brother-in-law and feeling well overall  - Pain primarily lower back and L posterior hip that radiates down LLE. Report pain as stable and adequately controlled. He uses Oxycontin 40 mg BID as prescribed - reports no extra doses since he was seen last. He's taking oxycodone/APAP as 1-2 tabs per dose, and he estimates an average of 6 tabs/day. Reports tolerating well and he denies excess sedation    - Bowels are moving regularly with colace daily and miralax daily  - Appetite: decreased  - Sleep: significantly improved with better pain relief    Palliative Performance Scale: 70% - Ambulation: Reduced / unable to do normal work, some evidence of disease / Self-Care: Full / Intake: Normal or reduced / Level of Conscious: Full    Coping/Support Issues:   Denies any support or coping issues. Has strong family, friend and church support    Goals of Care:   Bruce Parker reports that his main goal is to live as long as he can. He would also like to live to next summer to enjoy the simple things in life including the fresh air, watching cars go by and taking care of himself. If he were to become to sick to take care of himself he would like to go to a nursing home so he would not be a burden for his family. If his time comes he would like to go in his sleep. He does not want surgery but otherwise is ok to continue aggressive treatments at this time, no formal Ads completed or in place    Social History:   Bruce Parker lives in [place] alone. However his sister [name] and [place] Darryl are actively involved in his care and check on him every day. His sister does most of his medications and his BIL takes him to all of his appointments. He uses a walker to get around, able to do his own ADLs but gets help with cooking, laundry and does not drive. He uses 3L O2 24/7. He used to [employer], also worked as a Nurse, adult. Denies EtOH, previous smoker but quit, denies drugs.    Advance Care Planning:   Does not have any formal ADs in place. Would like his sister, [name], to be his formal health care decision maker but has not completed the paperwork for his.   HCPOA: none  Natural surrogate decision maker: sister  Living Will: none  ACP note: none    Objective       Oncology History    --In 04/2014, PSA elevated to 56.4. DRE normal.  Prostate bx scheduled, but canceled by pt.  --In 09/2014, diagnosed with nonsmall cell lung cancer, stage IV (T2b, N2, M1b) with a large R hilar mass, mediastinal adenopathy, mets to bones. No uptake in liver or pancreas on PET scan. Insufficient material for molecular testing.  Guardant360 for ctDNA was negative.  Pt was subsequently treated with carboplatin/Alimta with initial good response.  Then disease progression.  On second line therapy with nivolumab.  --In 01/2015, PSA 62.2.  Seen in Urology clinic and referred to [facility] for possibility of prostate cancer.  After discussions about various options, underwent bone bx in 05/2015, nondiagnostic. PSA down to 23.7  --In 07/2015, prostate bx showed Gleason 4+5=9 adenocarcinoma.  --In 08/2015, androgen deprivation therapy with Lupron started.  PSA 354.  --In 11/2015, Lupron continued. PSA down to 1.7, which was the nadir. Subsequently, PSA began rising.  --In 06/2016, PSA up to 32.  Enzalutamide started.  MRI of brain neg. Bone scan stable.  --In 02/2017, PSA rise on enzalutamide. Bone scan stable. Radium 223 added        Lung cancer, primary, with metastasis from lung to other site (CMS-HCC) (Resolved)    10/11/2014 Initial Diagnosis     Lung cancer, primary, with metastasis from lung to other site [facility])           Prostate cancer (CMS-HCC)    07/27/2015 Initial Diagnosis     Prostate cancer ([facility]-HCC)            Patient Active Problem List   Diagnosis   ??? Derangement of right patella   ??? Hypertension, benign   ??? Osteoarthrosis   ??? Chronic renal disease, stage 3, moderately decreased glomerular filtration rate (GFR) between 30-59 mL/min/1.73 square meter (CMS-HCC)   ??? Lichen planus   ??? Weakness   ??? Lung mass   ??? Mediastinal lymphadenopathy   ??? Malignant neoplasm metastatic to bone (CMS-HCC)   ??? Pain of metastatic malignancy   ??? Non-small cell carcinoma of lung (CMS-HCC)   ??? Protein-calorie malnutrition (CMS-HCC)   ??? Acute respiratory failure (CMS-HCC)   ??? Shortness of breath   ??? Tobacco dependence syndrome   ??? Prostate cancer (CMS-HCC)       Past Medical History:   Diagnosis Date   ??? Lung cancer (CMS-HCC)    ??? On supplemental oxygen therapy        Past Surgical History:   Procedure Laterality Date   ??? APPENDECTOMY     ??? PR BRNCHSC EBUS GUIDED SAMPL 3/> NODE STATION/STRUX N/A 05/18/2015    Procedure: Bronch, Rigid Or Flexible, Including Fluoro Guidance, When Performed; W Ebus Guided Transtracheal And/Or Transbronchial Sampling, 3 Or More Mediastinal And/Or Hilar Lymph Node Stations Or Structures;  Surgeon: Mathis Bud, MD;  Location: MAIN OR [facility];  Service: Pulmonary   ??? small intestines removed per pt         Current Outpatient Prescriptions   Medication Sig Dispense Refill   ??? albuterol 2.5 mg /3 mL (0.083 %) nebulizer solution Inhale 2.5 mg by nebulization every four (4) hours as needed.      ??? AMITIZA 24 mcg capsule Take 24 mcg by mouth daily with breakfast.      ??? atorvastatin (LIPITOR) 40 MG tablet      ??? betamethasone dipropionate (DIPROLENE) 0.05 % ointment Apply twice daily to affected areas as needed for itching (Patient not taking: Reported on 08/06/2017) 45 g 2   ??? diphenhydrAMINE (BENADRYL) 25 mg tablet Take 25 mg by mouth every six (6) hours as needed.      ??? doxycycline (VIBRA-TABS) 100 MG tablet Take 1 tablet (100 mg total) by mouth daily. (Patient not taking: Reported on 07/09/2017) 30 tablet 0   ??? enzalutamide (XTANDI) 40 mg cap capsule Take 4 capsules (160 mg total) by mouth daily. 120 capsule 11   ??? fluocinolone 0.01 % external oil Bid to back (Patient not taking: Reported on 07/09/2017) 120 mL 2   ??? folic acid (FOLVITE) 1 MG tablet Take 1  mg by mouth.     ??? food supplemt, lactose-reduced (ENSURE PLUS) 0.05-1.5 gram-kcal/mL liquid Take 237 mL by mouth daily.      ??? furosemide (LASIX) 20 MG tablet Take 20 mg by mouth.     ??? lactose-reduced food (ENSURE ENLIVE ORAL) Take 237 mL by mouth.     ??? levocetirizine (XYZAL) 5 MG tablet Take 5 mg by mouth.     ??? lidocaine-prilocaine (EMLA) cream Place on port site the morning of chemo     ??? magnesium oxide (MAG-OX) 400 mg tablet Take 400 mg by mouth.     ??? metoprolol tartrate (LOPRESSOR) 25 MG tablet Take 12.5 mg by mouth.     ??? mirtazapine (REMERON) 15 MG tablet Take 15 mg by mouth nightly.      ??? naloxone (NARCAN) 4 mg nasal spray One spray in either nostril once for known/suspected opioid overdose. May repeat every 2-3 minutes in alternating nostril til EMS arrives 1 each 0   ??? oxyCODONE (OXYCONTIN) 40 mg 12 hr crush resistant tablet Take 1 tablet (40 mg total) by mouth every twelve (12) hours. 60 tablet 0   ??? oxyCODONE-acetaminophen (PERCOCET) 10-325 mg per tablet Take 1-2 tablets by mouth every four (4) hours as needed for pain. 240 tablet 0   ??? pantoprazole (PROTONIX) 40 MG tablet Take 40 mg by mouth.     ??? polyethylene glycol (MIRALAX) 17 gram packet Take 17 g by mouth daily.      ??? predniSONE (DELTASONE) 10 MG tablet      ??? prochlorperazine (COMPAZINE) 10 MG tablet Take 10 mg by mouth every eight (8) hours as needed.      ??? senna-docusate (PERICOLACE) 8.6-50 mg Take 1 tablet by mouth.     ??? sodium bicarbonate 650 mg tablet Take 650 mg by mouth.     ??? SUMAtriptan (IMITREX) 25 MG tablet      ??? tamsulosin (FLOMAX) 0.4 mg capsule Take 1 capsule (0.4 mg total) by mouth daily. 30 capsule 11   ??? triamcinolone (KENALOG) 0.1 % ointment Apply to arms twice a day until clear, then stop. (Patient not taking: Reported on 08/06/2017) 30 g 1   ??? valACYclovir (VALTREX) 1000 MG tablet Take 1 tablet (1,000 mg total) by mouth daily. 30 tablet 0     No current facility-administered medications for this visit.        Allergies: No Known Allergies    Family History:  Cancer-related family history includes Cancer in his brother. There is no history of Melanoma.  indicated that the status of his mother is unknown. He indicated that the status of his brother is unknown. He indicated that the status of his neg hx is unknown.         Lab Results   Component Value Date    CREATININE 2.21 (H) 08/06/2017     Lab Results   Component Value Date    ALKPHOS 315 (H) 08/06/2017    BILITOT 0.8 08/06/2017    PROT 5.8 (L) 08/06/2017    ALBUMIN 3.3 (L) 08/06/2017    ALT 60 08/06/2017    AST 70 (H) 08/06/2017        I personally spent over half of a total 30 minutes in counseling and discussion with the patient as described above.    Jenkins Rouge, PharmD, BCOP, CPP  Outpatient Oncology Palliative Care

## 2017-08-21 ENCOUNTER — Encounter: Payer: Self-pay | Admitting: Oncology

## 2017-08-21 ENCOUNTER — Ambulatory Visit (HOSPITAL_BASED_OUTPATIENT_CLINIC_OR_DEPARTMENT_OTHER): Payer: Medicare Other

## 2017-08-21 ENCOUNTER — Ambulatory Visit (HOSPITAL_BASED_OUTPATIENT_CLINIC_OR_DEPARTMENT_OTHER): Payer: Medicare Other | Admitting: Oncology

## 2017-08-21 ENCOUNTER — Other Ambulatory Visit (HOSPITAL_BASED_OUTPATIENT_CLINIC_OR_DEPARTMENT_OTHER): Payer: Medicare Other

## 2017-08-21 ENCOUNTER — Telehealth: Payer: Self-pay | Admitting: Oncology

## 2017-08-21 VITALS — BP 134/82 | HR 102 | Temp 98.5°F | Resp 18 | Ht 69.0 in

## 2017-08-21 DIAGNOSIS — C787 Secondary malignant neoplasm of liver and intrahepatic bile duct: Secondary | ICD-10-CM

## 2017-08-21 DIAGNOSIS — C3401 Malignant neoplasm of right main bronchus: Secondary | ICD-10-CM

## 2017-08-21 DIAGNOSIS — C3491 Malignant neoplasm of unspecified part of right bronchus or lung: Secondary | ICD-10-CM

## 2017-08-21 DIAGNOSIS — C801 Malignant (primary) neoplasm, unspecified: Secondary | ICD-10-CM

## 2017-08-21 DIAGNOSIS — Z79899 Other long term (current) drug therapy: Secondary | ICD-10-CM

## 2017-08-21 DIAGNOSIS — C61 Malignant neoplasm of prostate: Secondary | ICD-10-CM

## 2017-08-21 DIAGNOSIS — C7951 Secondary malignant neoplasm of bone: Secondary | ICD-10-CM

## 2017-08-21 DIAGNOSIS — C7889 Secondary malignant neoplasm of other digestive organs: Secondary | ICD-10-CM

## 2017-08-21 DIAGNOSIS — Z5112 Encounter for antineoplastic immunotherapy: Secondary | ICD-10-CM

## 2017-08-21 DIAGNOSIS — G893 Neoplasm related pain (acute) (chronic): Secondary | ICD-10-CM

## 2017-08-21 DIAGNOSIS — R5382 Chronic fatigue, unspecified: Secondary | ICD-10-CM

## 2017-08-21 DIAGNOSIS — R748 Abnormal levels of other serum enzymes: Secondary | ICD-10-CM

## 2017-08-21 LAB — CBC WITH DIFFERENTIAL/PLATELET
BASO%: 0.4 % (ref 0.0–2.0)
Basophils Absolute: 0 10*3/uL (ref 0.0–0.1)
EOS ABS: 0.1 10*3/uL (ref 0.0–0.5)
EOS%: 1.2 % (ref 0.0–7.0)
HCT: 34.8 % — ABNORMAL LOW (ref 38.4–49.9)
HGB: 11.5 g/dL — ABNORMAL LOW (ref 13.0–17.1)
LYMPH%: 20.5 % (ref 14.0–49.0)
MCH: 33.7 pg — AB (ref 27.2–33.4)
MCHC: 32.9 g/dL (ref 32.0–36.0)
MCV: 102.3 fL — ABNORMAL HIGH (ref 79.3–98.0)
MONO#: 0.8 10*3/uL (ref 0.1–0.9)
MONO%: 17.9 % — AB (ref 0.0–14.0)
NEUT%: 60 % (ref 39.0–75.0)
NEUTROS ABS: 2.7 10*3/uL (ref 1.5–6.5)
Platelets: 180 10*3/uL (ref 140–400)
RBC: 3.4 10*6/uL — AB (ref 4.20–5.82)
RDW: 15.8 % — ABNORMAL HIGH (ref 11.0–14.6)
WBC: 4.5 10*3/uL (ref 4.0–10.3)
lymph#: 0.9 10*3/uL (ref 0.9–3.3)

## 2017-08-21 LAB — COMPREHENSIVE METABOLIC PANEL
ALT: 42 U/L (ref 0–55)
ANION GAP: 9 meq/L (ref 3–11)
AST: 84 U/L — ABNORMAL HIGH (ref 5–34)
Albumin: 2.2 g/dL — ABNORMAL LOW (ref 3.5–5.0)
Alkaline Phosphatase: 374 U/L — ABNORMAL HIGH (ref 40–150)
BILIRUBIN TOTAL: 0.5 mg/dL (ref 0.20–1.20)
BUN: 13.1 mg/dL (ref 7.0–26.0)
CALCIUM: 8.8 mg/dL (ref 8.4–10.4)
CO2: 23 mEq/L (ref 22–29)
CREATININE: 1.7 mg/dL — AB (ref 0.7–1.3)
Chloride: 108 mEq/L (ref 98–109)
EGFR: 50 mL/min/{1.73_m2} — ABNORMAL LOW (ref >60)
Glucose: 85 mg/dl (ref 70–140)
Potassium: 4.4 mEq/L (ref 3.5–5.1)
SODIUM: 141 meq/L (ref 136–145)
Total Protein: 6.2 g/dL — ABNORMAL LOW (ref 6.4–8.3)

## 2017-08-21 LAB — RESEARCH LABS

## 2017-08-21 LAB — TSH: TSH: 1.897 m(IU)/L (ref 0.320–4.118)

## 2017-08-21 MED ORDER — HEPARIN SOD (PORK) LOCK FLUSH 100 UNIT/ML IV SOLN
500.0000 [IU] | Freq: Once | INTRAVENOUS | Status: AC | PRN
  Administered 2017-08-21: 500 [IU]
  Filled 2017-08-21: qty 5

## 2017-08-21 MED ORDER — SODIUM CHLORIDE 0.9% FLUSH
10.0000 mL | INTRAVENOUS | Status: DC | PRN
  Administered 2017-08-21: 10 mL
  Filled 2017-08-21: qty 10

## 2017-08-21 MED ORDER — SODIUM CHLORIDE 0.9 % IV SOLN
Freq: Once | INTRAVENOUS | Status: AC
  Administered 2017-08-21: 16:00:00 via INTRAVENOUS

## 2017-08-21 MED ORDER — SODIUM CHLORIDE 0.9 % IV SOLN
240.0000 mg | Freq: Once | INTRAVENOUS | Status: AC
  Administered 2017-08-21: 240 mg via INTRAVENOUS
  Filled 2017-08-21: qty 24

## 2017-08-21 NOTE — Progress Notes (Signed)
[place] OFFICE PROGRESS NOTE  Curt Bears, MD [address] [place] [address]  DIAGNOSIS:  1) Stage IV (T2b, N2, M1b) non-small cell lung cancer, adenocarcinoma with negative EGFR mutation and negative gene translocation presented with a large right hilar mass as well as mediastinal lymphadenopathy and metastatic disease to the liver, bone and pancreas diagnosed in February 2016. 2) prostate adenocarcinoma diagnosed at [facility] with Gleason score 9 (4+5). 3) treatment with Radium 223 for prostate cancer at [facility] on 02/21/2017  PRIOR THERAPY: 1) Systemic chemotherapy with carboplatin for AUC of 5 and Alimta 500 MG/M2 every 3 weeks. Status post 6 cycles. 2) Enzalutamide for prostate cancer at [facility] 3) Immunotherapy with Nivolumab 240 MG every 2 weeks, status post 48 cycles. 4) Radium 223 monthly at [facility], S/p 2 cycles. 5)Immunotherapy with Nivolumab 480 MG every 4 weeks, first cycle 04/10/2017. 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  CURRENT THERAPY: Resuming immunotherapy with Nivolumab240 mg IV every 2 weeks.First dose12/01/2017.  Status post 2 cycles.  INTERVAL HISTORY: Francisco Love 57 y.o. male returns for routine follow-up visit accompanied by his brother-in-law.  The patient reports that he was recently hospitalized near [place] for dehydration and weakness.  He also had uncontrolled pain and was started on a fentanyl patch in addition to his other medications.  He is now at a rehabilitation facility to get stronger.  The patient denies fevers and chills.  Denies chest pain, shortness of breath, cough, hemoptysis.  Denies nausea, vomiting, constipation, diarrhea.  The patient reports that he is not eating very well.  He was unable to be stand to be weighed today.  The patient is here for evaluation prior to cycle #3 of Nivolumab.  MEDICAL HISTORY: Past Medical  History:  Diagnosis Date  . Chronic fatigue 04/12/2016  . Chronic pain 04/12/2016  . Chronic renal disease, stage III ([place])   . Metastatic cancer ([place]) dx'd 09/2014    ALLERGIES:  has No Known Allergies.  MEDICATIONS:  Current Outpatient Medications  Medication Sig Dispense Refill  . albuterol (PROVENTIL) (2.5 MG/3ML) 0.083% nebulizer solution Take 3 mLs (2.5 mg total) by nebulization every 2 (two) hours as needed for wheezing. 75 mL 12  . AMITIZA 24 MCG capsule Take 24 mcg by mouth 2 (two) times daily with a meal.     . atorvastatin (LIPITOR) 40 MG tablet Take 40 mg by mouth daily.    . betamethasone dipropionate (DIPROLENE) 0.05 % ointment Apply twice daily to affected areas as needed for itching    . bicalutamide (CASODEX) 50 MG tablet Take 1 tablet (50 mg total) by mouth daily. HOLD UNTIL SEEN BY ONCOLOGY.    Marland Kitchen doxycycline (DORYX) 100 MG EC tablet Take 100 mg by mouth 2 (two) times daily.    . folic acid (FOLVITE) 1 MG tablet Take 1 mg by mouth daily.    . furosemide (LASIX) 20 MG tablet Take 1 tablet (20 mg total) by mouth daily as needed for fluid or edema. 30 tablet 0  . lidocaine-prilocaine (EMLA) cream Apply 1 application topically as needed. Apply to port site prior to chemotherapy. 30 g 0  . magnesium oxide (MAG-OX) 400 MG tablet Take 400 mg by mouth daily.    . mirtazapine (REMERON) 15 MG tablet Take 1 tablet (15 mg total) by mouth at bedtime.    . naloxone (NARCAN) nasal spray 4 mg/0.1 mL Place 1 spray into the nose once.    Marland Kitchen  Nutritional Supplements (ENSURE ACTIVE HIGH PROTEIN PO) Take 1 Can by mouth daily as needed.    Marland Kitchen oxyCODONE (OXY IR/ROXICODONE) 5 MG immediate release tablet Take 1 tablet (5 mg total) by mouth every 4 (four) hours as needed for severe pain. 60 tablet 0  . oxyCODONE (OXYCONTIN) 40 mg 12 hr tablet Take 40 mg by mouth every 12 (twelve) hours.    Marland Kitchen oxyCODONE-acetaminophen (PERCOCET) 10-325 MG tablet Take 1-2 tablets by mouth every 4 (four) hours as needed.     . polyethylene glycol (MIRALAX / GLYCOLAX) packet Take 17 g by mouth daily as needed for mild constipation. (Patient not taking: Reported on 07/25/2017) 14 each 0  . predniSONE (DELTASONE) 20 MG tablet Take 20 mg by mouth daily with breakfast.    . prochlorperazine (COMPAZINE) 10 MG tablet Take 1 tablet (10 mg total) by mouth every 6 (six) hours as needed for nausea or vomiting. 30 tablet 1  . senna-docusate (SENOKOT-S) 8.6-50 MG per tablet Take 1 tablet by mouth daily. (Patient not taking: Reported on 07/25/2017)    . sodium bicarbonate 650 MG tablet Take 1 tablet (650 mg total) by mouth daily. 30 tablet 0  . tamsulosin (FLOMAX) 0.4 MG CAPS capsule Take 0.4 mg by mouth daily.     . valACYclovir (VALTREX) 500 MG tablet Take 500 mg by mouth 2 (two) times daily.    [name] 40 MG capsule Take 160 mg by mouth every evening.      No current facility-administered medications for this visit.    Facility-Administered Medications Ordered in Other Visits  Medication Dose Route Frequency Provider Last Rate Last Dose  . 0.9 %  sodium chloride infusion   Intravenous Once Curt Bears, MD      . heparin lock flush 100 unit/mL  500 Units Intracatheter Once PRN Curt Bears, MD      . nivolumab (OPDIVO) 240 mg in sodium chloride 0.9 % 100 mL chemo infusion  240 mg Intravenous Once Curt Bears, MD      . sodium chloride flush (NS) 0.9 % injection 10 mL  10 mL Intracatheter PRN Curt Bears, MD        SURGICAL HISTORY:  Past Surgical History:  Procedure Laterality Date  . APPENDECTOMY    . EUS N/A 03/28/2017   Procedure: UPPER ENDOSCOPIC ULTRASOUND (EUS) RADIAL;  Surgeon: Milus Banister, MD;  Location: [facility];  Service: Endoscopy;  Laterality: N/A;  . VIDEO BRONCHOSCOPY N/A 09/21/2014   Procedure: VIDEO BRONCHOSCOPY WITH FLUORO;  Surgeon: Rigoberto Noel, MD;  Location: [facility];  Service: Cardiopulmonary;  Laterality: N/A;  . VIDEO BRONCHOSCOPY WITH ENDOBRONCHIAL ULTRASOUND N/A  09/23/2014   Procedure: VIDEO BRONCHOSCOPY WITH ENDOBRONCHIAL ULTRASOUND;  Surgeon: Rigoberto Noel, MD;  Location: [facility];  Service: Pulmonary;  Laterality: N/A;    REVIEW OF SYSTEMS:   Review of Systems  Constitutional: Negative for appetite change, chills, fever and unexpected weight change. Positive for weakness. HENT:   Negative for mouth sores, nosebleeds, sore throat and trouble swallowing.   Eyes: Negative for eye problems and icterus.  Respiratory: Negative for cough, hemoptysis, shortness of breath and wheezing.   Cardiovascular: Negative for chest pain and leg swelling.  Gastrointestinal: Negative for abdominal pain, constipation, diarrhea, nausea and vomiting.  Genitourinary: Negative for bladder incontinence, difficulty urinating, dysuria, frequency and hematuria.   Musculoskeletal: Positive for back pain. Skin: Negative for itching and rash.  Neurological: Negative for dizziness, headaches, light-headedness and seizures.  Hematological: Negative for adenopathy. Does  not bruise/bleed easily.  Psychiatric/Behavioral: Negative for confusion, depression and sleep disturbance. The patient is not nervous/anxious.     PHYSICAL EXAMINATION:  Blood pressure 134/82, pulse (!) 102, temperature 98.5 F (36.9 C), temperature source Oral, resp. rate 18, height _0  (1.753 m), SpO2 99 %.  ECOG PERFORMANCE STATUS: 2 - Symptomatic, <50% confined to bed  Physical Exam  Constitutional: Oriented to person, place, and time and well-developed, well-nourished, and in no distress. No distress.  HENT:  Head: Normocephalic and atraumatic.  Mouth/Throat: Oropharynx is clear and moist. No oropharyngeal exudate.  Eyes: Conjunctivae are normal. Right eye exhibits no discharge. Left eye exhibits no discharge. No scleral icterus.  Neck: Normal range of motion. Neck supple.  Cardiovascular: Normal rate, regular rhythm, normal heart sounds and intact distal pulses.   Pulmonary/Chest: Effort normal and  breath sounds normal. No respiratory distress. No wheezes. No rales.  Abdominal: Soft. Bowel sounds are normal. Exhibits no distension and no mass. There is no tenderness.  Musculoskeletal: Normal range of motion. Exhibits no edema. Left arm edema due to recent IV. Lymphadenopathy:    No cervical adenopathy.  Neurological: Alert and oriented to person, place, and time. Exhibits normal muscle tone. Coordination normal.  Skin: Skin is warm and dry. No rash noted. Not diaphoretic. No erythema. No pallor.  Psychiatric: Mood, memory and judgment normal.  Vitals reviewed.  LABORATORY DATA: Lab Results  Component Value Date   WBC 4.5 08/21/2017   HGB 11.5 (L) 08/21/2017   HCT 34.8 (L) 08/21/2017   MCV 102.3 (H) 08/21/2017   PLT 180 08/21/2017      Chemistry      Component Value Date/Time   NA 141 08/21/2017 1359   K 4.4 08/21/2017 1359   CL 114 (H) 05/18/2017 0500   CO2 23 08/21/2017 1359   BUN 13.1 08/21/2017 1359   CREATININE 1.7 (H) 08/21/2017 1359      Component Value Date/Time   CALCIUM 8.8 08/21/2017 1359   ALKPHOS 374 (H) 08/21/2017 1359   AST 84 (H) 08/21/2017 1359   ALT 42 08/21/2017 1359   BILITOT 0.50 08/21/2017 1359       RADIOGRAPHIC STUDIES:  No results found.   ASSESSMENT/PLAN:  Non-small cell cancer of right lung [facility]) This is a very pleasant 57 year old African-American male with metastatic non-small cell lung cancer, adenocarcinoma status post induction systemic chemotherapy was carboplatin and Alimta. He was started on second line treatment with Nivolumab 240 mg IV every 2 weeks is status post 48 cycles. He then receivedtreatment with Nivolumab 480 MG IV every 4 weeks is status post 1 cycle. This treatment with the high dose Nivolumab was discontinued secondary to immunotherapy mediated hepatitis. The patient was a started on high-dose prednisone that has been tapered over the last several weeks.  He has now completed his prednisone.  The patient the  patient tolerated his first cycle of Nivolumab 240 mg every 2 weeks fairly well.His comprehensive metabolic panel today showed continued mild elevation in his alkaline phosphatase and AST.    The patient is also having increased back pain.  The patient was seen with Dr. Julien Nordmann.  Discussed lab findings with the patient and his brother-in-law.  We discussed that the elevation in the alkaline phosphatase could be related to his bone disease from his prostate cancer.  Recommend that he proceed with cycle 3 of Nivolumab today as scheduled.  We will obtain a restaging CT scan of the chest, abdomen, pelvis prior to his next  visit.  The patient will return in 2 weeks for evaluation prior to cycle 4 of Nivolumab and to review his restaging CT scan results.  For pain management he will continue his current pain medication by the pain clinic at [facility].  He will continue outpatient rehabilitation closer to home.  The patient was advised to call immediately if he has any concerning symptoms in the interval. The patient voices understanding of current disease status and treatment options and is in agreement with the current care plan. All questions were answered. The patient knows to call the clinic with any problems, questions or concerns. We can certainly see the patient much sooner if necessary.  Orders Placed This Encounter  Procedures  . CT CHEST W CONTRAST    Standing Status:   Future    Standing Expiration Date:   08/21/2018    Order Specific Question:   If indicated for the ordered procedure, I authorize the administration of contrast media per Radiology protocol    Answer:   Yes    Order Specific Question:   Preferred imaging location?    Answer:   [facility]    Order Specific Question:   Radiology Contrast Protocol - do NOT remove file path    Answer:   [email or web address]    Order Specific Question:   Reason for Exam additional comments     Answer:   lung cancer. prostate cancer. restaging.  Marland Kitchen CT ABDOMEN PELVIS W CONTRAST    Standing Status:   Future    Standing Expiration Date:   08/21/2018    Order Specific Question:   If indicated for the ordered procedure, I authorize the administration of contrast media per Radiology protocol    Answer:   Yes    Order Specific Question:   Preferred imaging location?    Answer:   [facility]    Order Specific Question:   Radiology Contrast Protocol - do NOT remove file path    Answer:   [email or web address]    Order Specific Question:   Reason for Exam additional comments    Answer:   lung cancer. prostate cancer. restaging.     Mikey Bussing, DNP, AGPCNP-BC, AOCNP 08/21/17  ADDENDUM: Hematology/Oncology Attending: I had a face-to-face encounter with the patient today.  I recommended his care plan.  This is a very pleasant 57 years old African-American male with metastatic non-small cell lung cancer, adenocarcinoma status post several chemotherapy regimens and he is currently on treatment with immunotherapy with Nivolumab every 2 weeks.  He is tolerating this treatment fairly well comprehensive metabolic panel today showed persistently elevated alkaline phosphatase but the patient has recent progression of his prostate adenocarcinoma and this is most likely coming from his bone disease.  He also has mild elevation of AST. I recommended for the patient to proceed with his treatment with Nivolumab today as a scheduled.  We will see him back for follow-up visit in 2 weeks for reevaluation after repeating CT scan of the chest, abdomen and pelvis for restaging of his disease. For the prostate adenocarcinoma, he is currently followed by Dr. Jacelyn Grip at [facility]. He was advised to call immediately if he has any concerning symptoms in the interval.  Disclaimer: This note was dictated with voice recognition software. Similar sounding words can inadvertently be  transcribed and may be missed upon review. Eilleen Kempf, MD 08/21/17

## 2017-08-21 NOTE — Assessment & Plan Note (Signed)
This is a very pleasant 57 year old African-American male with metastatic non-small cell lung cancer, adenocarcinoma status post induction systemic chemotherapy was carboplatin and Alimta. He was started on second line treatment with Nivolumab 240 mg IV every 2 weeks is status post 48 cycles. He then receivedtreatment with Nivolumab 480 MG IV every 4 weeks is status post 1 cycle. This treatment with the high dose Nivolumab was discontinued secondary to immunotherapy mediated hepatitis. The patient was a started on high-dose prednisone that has been tapered over the last several weeks.  He has now completed his prednisone.  The patient the patient tolerated his first cycle of Nivolumab 240 mg every 2 weeks fairly well.His comprehensive metabolic panel today showed continued mild elevation in his alkaline phosphatase and AST.    The patient is also having increased back pain.  The patient was seen with Dr. Julien Nordmann.  Discussed lab findings with the patient and his brother-in-law.  We discussed that the elevation in the alkaline phosphatase could be related to his bone disease from his prostate cancer.  Recommend that he proceed with cycle 3 of Nivolumab today as scheduled.  We will obtain a restaging CT scan of the chest, abdomen, pelvis prior to his next visit.  The patient will return in 2 weeks for evaluation prior to cycle 4 of Nivolumab and to review his restaging CT scan results.  For pain management he will continue his current pain medication by the pain clinic at [facility].  He will continue outpatient rehabilitation closer to home.  The patient was advised to call immediately if he has any concerning symptoms in the interval. The patient voices understanding of current disease status and treatment options and is in agreement with the current care plan. All questions were answered. The patient knows to call the clinic with any problems, questions or concerns. We can certainly see  the patient much sooner if necessary.

## 2017-08-21 NOTE — Telephone Encounter (Signed)
Gave patient avs and calendar with appts.  °

## 2017-08-21 NOTE — Progress Notes (Signed)
Ok to treat with AST 84 and SCr 1.7 per Mikey Bussing, NP

## 2017-08-21 NOTE — Patient Instructions (Signed)
Cancer Center Discharge Instructions for Patients Receiving Chemotherapy  Today you received the following chemotherapy agents :  Opdivo.  To help prevent nausea and vomiting after your treatment, we encourage you to take your nausea medication as prescribed.   If you develop nausea and vomiting that is not controlled by your nausea medication, call the clinic.   BELOW ARE SYMPTOMS THAT SHOULD BE REPORTED IMMEDIATELY:  *FEVER GREATER THAN 100.5 F  *CHILLS WITH OR WITHOUT FEVER  NAUSEA AND VOMITING THAT IS NOT CONTROLLED WITH YOUR NAUSEA MEDICATION  *UNUSUAL SHORTNESS OF BREATH  *UNUSUAL BRUISING OR BLEEDING  TENDERNESS IN MOUTH AND THROAT WITH OR WITHOUT PRESENCE OF ULCERS  *URINARY PROBLEMS  *BOWEL PROBLEMS  UNUSUAL RASH Items with * indicate a potential emergency and should be followed up as soon as possible.  Feel free to call the clinic should you have any questions or concerns. The clinic phone number is [phone].  Please show the CHEMO ALERT CARD at check-in to the Emergency Department and triage nurse.   

## 2017-08-22 NOTE — Unmapped (Signed)
[facility] Refill Coordination Note  Specialty Medication(s): Xtandi 40mg     Bruce Parker, DOB: Dec 16, 1960  Phone: [phone] (home) , Alternate phone contact: N/A  Phone or address changes today?: No  All above HIPAA information was verified with patient's family member.( brother in Social worker)  Shipping Address: [address]  [place] [address]   Insurance changes? No    Completed refill call assessment today to schedule patient's medication shipment from the [facility] [phone]).      Confirmed the medication and dosage are correct and have not changed: Yes, regimen is correct and unchanged.    Confirmed patient started or stopped the following medications in the past month:  No, there are no changes reported at this time.    Are you tolerating your medication?:  Bruce Parker reports tolerating the medication.    ADHERENCE    Is this medicine transplant or covered by Medicare Part B? No.        Did you miss any doses in the past 4 weeks? No missed doses reported.    FINANCIAL/SHIPPING    Delivery Scheduled: Yes, Expected medication delivery date: 08/27/17     Bruce Parker did not have any additional questions at this time.    Delivery address validated in [facility] scheduling system: Yes, address listed in [facility] is correct.    We will follow up with patient monthly for standard refill processing and delivery.      Thank you,  [employer]   [facility] Specialty Pharmacist

## 2017-08-25 MED FILL — XTANDI/40MG/CAPS: XTANDI/40MG/CAPS | 30 days supply | Qty: 120 | Fill #8

## 2017-09-03 ENCOUNTER — Other Ambulatory Visit: Admit: 2017-09-03 | Discharge: 2017-09-03 | Payer: MEDICARE

## 2017-09-03 ENCOUNTER — Ambulatory Visit
Admit: 2017-09-03 | Discharge: 2017-09-03 | Payer: MEDICARE | Attending: Hematology & Oncology | Primary: Hematology & Oncology

## 2017-09-03 ENCOUNTER — Ambulatory Visit
Admit: 2017-09-03 | Discharge: 2017-09-03 | Payer: MEDICARE | Attending: Pharmacist Clinician (PhC)/ Clinical Pharmacy Specialist | Primary: Pharmacist Clinician (PhC)/ Clinical Pharmacy Specialist

## 2017-09-03 DIAGNOSIS — C61 Malignant neoplasm of prostate: Principal | ICD-10-CM

## 2017-09-03 DIAGNOSIS — C3490 Malignant neoplasm of unspecified part of unspecified bronchus or lung: Secondary | ICD-10-CM

## 2017-09-03 DIAGNOSIS — C7951 Secondary malignant neoplasm of bone: Secondary | ICD-10-CM

## 2017-09-03 DIAGNOSIS — G893 Neoplasm related pain (acute) (chronic): Secondary | ICD-10-CM

## 2017-09-03 DIAGNOSIS — Z515 Encounter for palliative care: Secondary | ICD-10-CM

## 2017-09-03 LAB — COMPREHENSIVE METABOLIC PANEL
ALBUMIN: 3.2 g/dL — ABNORMAL LOW (ref 3.5–5.0)
ALKALINE PHOSPHATASE: 550 U/L — ABNORMAL HIGH (ref 38–126)
ALT (SGPT): 47 U/L (ref 19–72)
ANION GAP: 12 mmol/L (ref 9–15)
AST (SGOT): 101 U/L — ABNORMAL HIGH (ref 19–55)
BILIRUBIN TOTAL: 0.8 mg/dL (ref 0.0–1.2)
BLOOD UREA NITROGEN: 15 mg/dL (ref 7–21)
CALCIUM: 8.9 mg/dL (ref 8.5–10.2)
CO2: 22 mmol/L (ref 22.0–30.0)
CREATININE: 1.64 mg/dL — ABNORMAL HIGH (ref 0.70–1.30)
EGFR MDRD AF AMER: 53 mL/min/{1.73_m2} — ABNORMAL LOW (ref >=60)
EGFR MDRD NON AF AMER: 43 mL/min/{1.73_m2} — ABNORMAL LOW (ref >=60)
GLUCOSE RANDOM: 92 mg/dL (ref 65–179)
POTASSIUM: 4.8 mmol/L (ref 3.5–5.0)
PROTEIN TOTAL: 6.1 g/dL — ABNORMAL LOW (ref 6.5–8.3)
SODIUM: 143 mmol/L (ref 135–145)

## 2017-09-03 LAB — CBC W/ AUTO DIFF
BASOPHILS ABSOLUTE COUNT: 0 10*9/L (ref 0.0–0.1)
EOSINOPHILS ABSOLUTE COUNT: 0.1 10*9/L (ref 0.0–0.4)
HEMATOCRIT: 36.3 % — ABNORMAL LOW (ref 41.0–53.0)
HEMOGLOBIN: 11.6 g/dL — ABNORMAL LOW (ref 13.5–17.5)
LARGE UNSTAINED CELLS: 3 % (ref 0–4)
LYMPHOCYTES ABSOLUTE COUNT: 0.8 10*9/L — ABNORMAL LOW (ref 1.5–5.0)
MEAN CORPUSCULAR HEMOGLOBIN CONC: 32.1 g/dL (ref 31.0–37.0)
MEAN CORPUSCULAR VOLUME: 104.6 fL — ABNORMAL HIGH (ref 80.0–100.0)
MEAN PLATELET VOLUME: 7.9 fL (ref 7.0–10.0)
MONOCYTES ABSOLUTE COUNT: 0.9 10*9/L — ABNORMAL HIGH (ref 0.2–0.8)
NEUTROPHILS ABSOLUTE COUNT: 6.5 10*9/L (ref 2.0–7.5)
PLATELET COUNT: 287 10*9/L (ref 150–440)
RED CELL DISTRIBUTION WIDTH: 18 % — ABNORMAL HIGH (ref 12.0–15.0)
WBC ADJUSTED: 8.6 10*9/L (ref 4.5–11.0)

## 2017-09-03 LAB — PROSTATE SPECIFIC ANTIGEN: Prostate specific Ag:MCnc:Pt:Ser/Plas:Qn:: 76.2 — ABNORMAL HIGH

## 2017-09-03 LAB — SMEAR REVIEW

## 2017-09-03 LAB — PROTEIN TOTAL: Protein:MCnc:Pt:Ser/Plas:Qn:: 6.1 — ABNORMAL LOW

## 2017-09-03 LAB — HEMATOCRIT: Lab: 36.3 — ABNORMAL LOW

## 2017-09-03 LAB — PHOSPHORUS: Phosphate:MCnc:Pt:Ser/Plas:Qn:: 3.9

## 2017-09-03 NOTE — Unmapped (Signed)
Labs drawn and sent for analysis.  Care provided by  AHubble, RN

## 2017-09-03 NOTE — Unmapped (Signed)
Lab Results   Component Value Date    PSA 70.80 (H) 08/06/2017    PSA 62.50 (H) 07/09/2017    PSA 61.10 (H) 07/02/2017    PSA 60.50 (H) 05/21/2017    PSA 64.20 (H) 04/23/2017    PSA 50.70 (H) 03/26/2017   Today's PSA is pending.  For now, continue with enzalutamide [name]).  Referral to radiation oncology for consideration of radiation.  Bone scan to be done right away.    Please call [phone] to reach my nurse navigator Mauricia Area for any issues.    For emergencies on Nights, Weekends and Holidays  Call [phone] and ask for the hematology/oncology on call.    Griffin Basil, MD, PhD  Associate Professor of Medicine  Division of Hematology-Oncology    [facility]  Genitourinary Oncology Clinic  Nurse Navigator: Mauricia Area  Fax: [phone]

## 2017-09-04 ENCOUNTER — Encounter (HOSPITAL_COMMUNITY): Payer: Self-pay

## 2017-09-04 ENCOUNTER — Inpatient Hospital Stay: Payer: Medicare Other

## 2017-09-04 ENCOUNTER — Ambulatory Visit (HOSPITAL_COMMUNITY)
Admission: RE | Admit: 2017-09-04 | Discharge: 2017-09-04 | Disposition: A | Discharge status: Home / Self Care | Payer: Medicare Other | Source: Ambulatory Visit | Attending: Oncology | Admitting: Oncology

## 2017-09-04 ENCOUNTER — Telehealth: Payer: Self-pay | Admitting: Internal Medicine

## 2017-09-04 ENCOUNTER — Encounter: Payer: Self-pay | Admitting: Internal Medicine

## 2017-09-04 ENCOUNTER — Inpatient Hospital Stay: Payer: Medicare Other | Attending: Internal Medicine | Admitting: Internal Medicine

## 2017-09-04 VITALS — BP 148/87 | HR 74 | Temp 97.8°F | Resp 19 | Ht 69.0 in | Wt 171.7 lb

## 2017-09-04 DIAGNOSIS — Z5112 Encounter for antineoplastic immunotherapy: Secondary | ICD-10-CM

## 2017-09-04 DIAGNOSIS — Z923 Personal history of irradiation: Secondary | ICD-10-CM | POA: Diagnosis not present

## 2017-09-04 DIAGNOSIS — C7951 Secondary malignant neoplasm of bone: Secondary | ICD-10-CM | POA: Insufficient documentation

## 2017-09-04 DIAGNOSIS — C787 Secondary malignant neoplasm of liver and intrahepatic bile duct: Secondary | ICD-10-CM | POA: Diagnosis not present

## 2017-09-04 DIAGNOSIS — R5382 Chronic fatigue, unspecified: Secondary | ICD-10-CM | POA: Insufficient documentation

## 2017-09-04 DIAGNOSIS — N183 Chronic kidney disease, stage 3 (moderate): Secondary | ICD-10-CM | POA: Insufficient documentation

## 2017-09-04 DIAGNOSIS — G893 Neoplasm related pain (acute) (chronic): Secondary | ICD-10-CM

## 2017-09-04 DIAGNOSIS — C61 Malignant neoplasm of prostate: Secondary | ICD-10-CM | POA: Diagnosis not present

## 2017-09-04 DIAGNOSIS — R748 Abnormal levels of other serum enzymes: Secondary | ICD-10-CM | POA: Diagnosis not present

## 2017-09-04 DIAGNOSIS — C7889 Secondary malignant neoplasm of other digestive organs: Secondary | ICD-10-CM | POA: Insufficient documentation

## 2017-09-04 DIAGNOSIS — C3491 Malignant neoplasm of unspecified part of right bronchus or lung: Secondary | ICD-10-CM | POA: Diagnosis not present

## 2017-09-04 DIAGNOSIS — R590 Localized enlarged lymph nodes: Secondary | ICD-10-CM | POA: Diagnosis not present

## 2017-09-04 DIAGNOSIS — K869 Disease of pancreas, unspecified: Secondary | ICD-10-CM

## 2017-09-04 DIAGNOSIS — I700 Atherosclerosis of aorta: Secondary | ICD-10-CM | POA: Diagnosis not present

## 2017-09-04 DIAGNOSIS — C801 Malignant (primary) neoplasm, unspecified: Principal | ICD-10-CM

## 2017-09-04 DIAGNOSIS — J439 Emphysema, unspecified: Secondary | ICD-10-CM | POA: Diagnosis not present

## 2017-09-04 DIAGNOSIS — N261 Atrophy of kidney (terminal): Secondary | ICD-10-CM | POA: Insufficient documentation

## 2017-09-04 DIAGNOSIS — Z9221 Personal history of antineoplastic chemotherapy: Secondary | ICD-10-CM | POA: Insufficient documentation

## 2017-09-04 DIAGNOSIS — M4856XA Collapsed vertebra, not elsewhere classified, lumbar region, initial encounter for fracture: Secondary | ICD-10-CM | POA: Diagnosis not present

## 2017-09-04 DIAGNOSIS — Z79899 Other long term (current) drug therapy: Secondary | ICD-10-CM | POA: Insufficient documentation

## 2017-09-04 LAB — COMPREHENSIVE METABOLIC PANEL
ALK PHOS: 656 U/L — AB (ref 40–150)
ALT: 41 U/L (ref 0–55)
AST: 85 U/L — ABNORMAL HIGH (ref 5–34)
Albumin: 2.7 g/dL — ABNORMAL LOW (ref 3.5–5.0)
Anion gap: 13 — ABNORMAL HIGH (ref 3–11)
BUN: 12 mg/dL (ref 7–26)
CALCIUM: 9.3 mg/dL (ref 8.4–10.4)
CHLORIDE: 108 mmol/L (ref 98–109)
CO2: 18 mmol/L — AB (ref 22–29)
CREATININE: 1.57 mg/dL — AB (ref 0.70–1.30)
GFR calc Af Amer: 54 mL/min — ABNORMAL LOW (ref >60)
GFR calc non Af Amer: 47 mL/min — ABNORMAL LOW (ref >60)
Glucose, Bld: 62 mg/dL — ABNORMAL LOW (ref 70–140)
Potassium: 4.2 mmol/L (ref 3.5–5.1)
SODIUM: 139 mmol/L (ref 136–145)
Total Bilirubin: 0.6 mg/dL (ref 0.2–1.2)
Total Protein: 7.4 g/dL (ref 6.4–8.3)

## 2017-09-04 LAB — CBC WITH DIFFERENTIAL/PLATELET
BASOS PCT: 1 %
Basophils Absolute: 0 10*3/uL (ref 0.0–0.1)
EOS ABS: 0.2 10*3/uL (ref 0.0–0.5)
Eosinophils Relative: 3 %
HCT: 39 % (ref 38.4–49.9)
HEMOGLOBIN: 12.8 g/dL — AB (ref 13.0–17.1)
LYMPHS ABS: 1.2 10*3/uL (ref 0.9–3.3)
Lymphocytes Relative: 18 %
MCH: 33.9 pg — AB (ref 27.2–33.4)
MCHC: 32.8 g/dL (ref 32.0–36.0)
MCV: 103.6 fL — ABNORMAL HIGH (ref 79.3–98.0)
MONO ABS: 1 10*3/uL — AB (ref 0.1–0.9)
MONOS PCT: 16 %
NEUTROS PCT: 62 %
Neutro Abs: 4 10*3/uL (ref 1.5–6.5)
Platelets: 268 10*3/uL (ref 140–400)
RBC: 3.77 MIL/uL — ABNORMAL LOW (ref 4.20–5.82)
RDW: 16.6 % — AB (ref 11.0–15.6)
WBC: 6.4 10*3/uL (ref 4.0–10.3)

## 2017-09-04 MED ORDER — SODIUM CHLORIDE 0.9% FLUSH
10.0000 mL | INTRAVENOUS | Status: DC | PRN
  Administered 2017-09-04: 10 mL
  Filled 2017-09-04: qty 10

## 2017-09-04 MED ORDER — SODIUM CHLORIDE 0.9 % IV SOLN
240.0000 mg | Freq: Once | INTRAVENOUS | Status: AC
  Administered 2017-09-04: 240 mg via INTRAVENOUS
  Filled 2017-09-04: qty 24

## 2017-09-04 MED ORDER — HEPARIN SOD (PORK) LOCK FLUSH 100 UNIT/ML IV SOLN
500.0000 [IU] | Freq: Once | INTRAVENOUS | Status: AC | PRN
  Administered 2017-09-04: 500 [IU]
  Filled 2017-09-04: qty 5

## 2017-09-04 MED ORDER — SODIUM CHLORIDE 0.9 % IV SOLN
Freq: Once | INTRAVENOUS | Status: AC
  Administered 2017-09-04: 10:00:00 via INTRAVENOUS

## 2017-09-04 MED ORDER — IOPAMIDOL (ISOVUE-300) INJECTION 61%
INTRAVENOUS | Status: AC
  Filled 2017-09-04: qty 100

## 2017-09-04 MED ORDER — IOPAMIDOL (ISOVUE-300) INJECTION 61%
100.0000 mL | Freq: Once | INTRAVENOUS | Status: AC | PRN
  Administered 2017-09-04: 80 mL via INTRAVENOUS

## 2017-09-04 MED ORDER — POLYETHYLENE GLYCOL 3350 17 GRAM/DOSE ORAL POWDER
Freq: Two times a day (BID) | ORAL | 11 refills | 0 days | Status: CP | PRN
Start: 2017-09-04 — End: ?

## 2017-09-04 MED ORDER — OXYCODONE ER 40 MG TABLET,CRUSH RESISTANT,EXTENDED RELEASE 12 HR
ORAL_TABLET | Freq: Two times a day (BID) | ORAL | 0 refills | 0 days | Status: CP
Start: 2017-09-04 — End: 2018-09-04

## 2017-09-04 MED ORDER — OXYCODONE-ACETAMINOPHEN 10 MG-325 MG TABLET
ORAL_TABLET | ORAL | 0 refills | 0 days | Status: CP | PRN
Start: 2017-09-04 — End: ?

## 2017-09-04 NOTE — Unmapped (Signed)
[facility] Visit Note    Patient Name: Bruce Parker  Patient Age: 57 y.o.  Encounter Date: 09/03/2017  Provider: Maurie Boettcher  Referring physician: Referring, Info Shawna Clamp*    Assessment  Patient Active Problem List   Diagnosis   ??? Derangement of right patella   ??? Hypertension, benign   ??? Osteoarthrosis   ??? Chronic renal disease, stage 3, moderately decreased glomerular filtration rate (GFR) between 30-59 mL/min/1.73 square meter (CMS-HCC)   ??? Lichen planus   ??? Weakness   ??? Lung mass   ??? Mediastinal lymphadenopathy   ??? Malignant neoplasm metastatic to bone (CMS-HCC)   ??? Pain of metastatic malignancy   ??? Non-small cell carcinoma of lung (CMS-HCC)   ??? Protein-calorie malnutrition (CMS-HCC)   ??? Acute respiratory failure (CMS-HCC)   ??? Shortness of breath   ??? Tobacco dependence syndrome   ??? Prostate cancer (CMS-HCC)     1. Metastatic castration resistant prostate cancer, with bone metastases  2. Stage IV nonsmall cell lung cancer, on secondline therapy with nivolumab s/p cycle #48 (q2w)    On CRPC therapy with enzalutamide.  Completed Radium-223 infusion for six cycles. PSA up. Bone scan was to be done prior to visit, but not done. PSA progression.  At this point, I don't think there are good systemic therapy options.  Pt is now at a point where palliative care alone (with hospice soon) should be pursued.      Plan  1. Continue enzalutamide dose to 160 mg qd (regular dose), since he is tolerating well.  Follow PSA.  -- As he is completing 6 infusions of Radium 223, his last NM Bone scan was 01/2017 with relatively recent CT CAP on 06/12/17 at [facility]   -- Bone scan to be done ASAP.  2. Referral to radiation oncology for consideration of RT to the ? L spine.  3. Lupron 22.5 mg IM, given on 08/06/17, next due 11/04/16 or later  4.Following with palliative care with improvement in pain control following adjustments to analgesic regimen  5. Consider referral to hospice soon, especially if bone scan shows unequivocal progressive disease.  Would need to discuss with lung cancer oncologist about nivo.  6. Return in 1 month.      Reason for Visit  Prostate cancer    History of Present Illness:  Oncology History    --In 04/2014, PSA elevated to 56.4. DRE normal.  Prostate bx scheduled, but canceled by pt.  --In 09/2014, diagnosed with nonsmall cell lung cancer, stage IV (T2b, N2, M1b) with a large R hilar mass, mediastinal adenopathy, mets to bones. No uptake in liver or pancreas on PET scan. Insufficient material for molecular testing.  Guardant360 for ctDNA was negative.  Pt was subsequently treated with carboplatin/Alimta with initial good response.  Then disease progression.  On second line therapy with nivolumab.  --In 01/2015, PSA 62.2.  Seen in Urology clinic and referred to [facility] for possibility of prostate cancer.  After discussions about various options, underwent bone bx in 05/2015, nondiagnostic. PSA down to 23.7  --In 07/2015, prostate bx showed Gleason 4+5=9 adenocarcinoma.  --In 08/2015, androgen deprivation therapy with Lupron started.  PSA 354.  --In 11/2015, Lupron continued. PSA down to 1.7, which was the nadir. Subsequently, PSA began rising.  --In 06/2016, PSA up to 32.  Enzalutamide started.  MRI of brain neg. Bone scan stable.  --In 02/2017, PSA rise on enzalutamide. Bone scan stable. Radium 223 added  --In  07/2017, s/p 6 infusions of Radium 223.        Lung cancer, primary, with metastasis from lung to other site (CMS-HCC) (Resolved)    10/11/2014 Initial Diagnosis     Lung cancer, primary, with metastasis from lung to other site [facility])           Prostate cancer (CMS-HCC)    07/27/2015 Initial Diagnosis     Prostate cancer (RAF-HCC)          The patient returns for scheduled follow up, accompanied by his brother-in-law as usual.  Since his last visit, he had an ER visit for uncontrolled pain.  Otherwise, he is at his usual level of functioning.  He's sitting around most of the day, inactive.  He continues his nivo infusion in [place].  Pt notes that it's the low back that hurts the most.    Allergies:  No Known Allergies    Current Medications:    Current Outpatient Prescriptions:   ???  albuterol 2.5 mg /3 mL (0.083 %) nebulizer solution, Inhale 2.5 mg by nebulization every four (4) hours as needed. , Disp: , Rfl:   ???  AMITIZA 24 mcg capsule, Take 24 mcg by mouth daily with breakfast. , Disp: , Rfl:   ???  atorvastatin (LIPITOR) 40 MG tablet, , Disp: , Rfl:   ???  betamethasone dipropionate (DIPROLENE) 0.05 % ointment, Apply twice daily to affected areas as needed for itching (Patient not taking: Reported on 08/06/2017), Disp: 45 g, Rfl: 2  ???  diphenhydrAMINE (BENADRYL) 25 mg tablet, Take 25 mg by mouth every six (6) hours as needed. , Disp: , Rfl:   ???  doxycycline (VIBRA-TABS) 100 MG tablet, Take 1 tablet (100 mg total) by mouth daily. (Patient not taking: Reported on 07/09/2017), Disp: 30 tablet, Rfl: 0  ???  enzalutamide (XTANDI) 40 mg cap capsule, Take 4 capsules (160 mg total) by mouth daily., Disp: 120 capsule, Rfl: 11  ???  fluocinolone 0.01 % external oil, Bid to back (Patient not taking: Reported on 07/09/2017), Disp: 120 mL, Rfl: 2  ???  folic acid (FOLVITE) 1 MG tablet, Take 1 mg by mouth., Disp: , Rfl:   ???  food supplemt, lactose-reduced (ENSURE PLUS) 0.05-1.5 gram-kcal/mL liquid, Take 237 mL by mouth daily. , Disp: , Rfl:   ???  furosemide (LASIX) 20 MG tablet, Take 20 mg by mouth., Disp: , Rfl:   ???  lactose-reduced food (ENSURE ENLIVE ORAL), Take 237 mL by mouth., Disp: , Rfl:   ???  levocetirizine (XYZAL) 5 MG tablet, Take 5 mg by mouth., Disp: , Rfl:   ???  lidocaine-prilocaine (EMLA) cream, Place on port site the morning of chemo, Disp: , Rfl:   ???  magnesium oxide (MAG-OX) 400 mg tablet, Take 400 mg by mouth., Disp: , Rfl:   ???  metoprolol tartrate (LOPRESSOR) 25 MG tablet, Take 12.5 mg by mouth., Disp: , Rfl:   ???  mirtazapine (REMERON) 15 MG tablet, Take 15 mg by mouth nightly. , Disp: , Rfl:   ???  naloxone (NARCAN) 4 mg nasal spray, One spray in either nostril once for known/suspected opioid overdose. May repeat every 2-3 minutes in alternating nostril til EMS arrives, Disp: 1 each, Rfl: 0  ???  oxyCODONE (OXYCONTIN) 40 mg 12 hr crush resistant tablet, Take 1 tablet (40 mg total) by mouth every twelve (12) hours., Disp: 60 tablet, Rfl: 0  ???  oxyCODONE-acetaminophen (PERCOCET) 10-325 mg per tablet, Take 1-2 tablets by mouth every four (4)  hours as needed for pain. Maximum of 8 tablets per day., Disp: 240 tablet, Rfl: 0  ???  pantoprazole (PROTONIX) 40 MG tablet, Take 40 mg by mouth., Disp: , Rfl:   ???  polyethylene glycol (MIRALAX) 17 gram packet, Take 17 g by mouth daily. , Disp: , Rfl:   ???  polyethylene glycol (MIRALAX) 17 gram/dose powder, Take 17 g by mouth two (2) times a day as needed., Disp: 595 g, Rfl: 11  ???  predniSONE (DELTASONE) 10 MG tablet, , Disp: , Rfl:   ???  prochlorperazine (COMPAZINE) 10 MG tablet, Take 10 mg by mouth every eight (8) hours as needed. , Disp: , Rfl:   ???  senna-docusate (PERICOLACE) 8.6-50 mg, Take 1 tablet by mouth., Disp: , Rfl:   ???  sodium bicarbonate 650 mg tablet, Take 650 mg by mouth., Disp: , Rfl:   ???  SUMAtriptan (IMITREX) 25 MG tablet, , Disp: , Rfl:   ???  tamsulosin (FLOMAX) 0.4 mg capsule, Take 1 capsule (0.4 mg total) by mouth daily., Disp: 30 capsule, Rfl: 11  ???  triamcinolone (KENALOG) 0.1 % ointment, Apply to arms twice a day until clear, then stop. (Patient not taking: Reported on 08/06/2017), Disp: 30 g, Rfl: 1  ???  valACYclovir (VALTREX) 1000 MG tablet, Take 1 tablet (1,000 mg total) by mouth daily., Disp: 30 tablet, Rfl: 0    Past Medical History and Social History  Past Medical History:   Diagnosis Date   ??? Lung cancer (CMS-HCC)    ??? On supplemental oxygen therapy       Past Surgical History:   Procedure Laterality Date   ??? APPENDECTOMY     ??? PR BRNCHSC EBUS GUIDED SAMPL 3/> NODE STATION/STRUX N/A 05/18/2015    Procedure: Bronch, Rigid Or Flexible, Including Fluoro Guidance, When Performed; W Ebus Guided Transtracheal And/Or Transbronchial Sampling, 3 Or More Mediastinal And/Or Hilar Lymph Node Stations Or Structures;  Surgeon: Mathis Bud, MD;  Location: MAIN OR [facility];  Service: Pulmonary   ??? small intestines removed per pt          Social History     Occupational History   ??? Not on file.     Social History Main Topics   ??? Smoking status: Former Smoker     Packs/day: 1.00     Years: 30.00     Types: Cigarettes     Quit date: 05/15/2014   ??? Smokeless tobacco: Never Used   ??? Alcohol use No   ??? Drug use: No   ??? Sexual activity: Not on file   Pt is accompanied by his brother-in-law, who usually brings him to clinic    Family History  Brother had lung cancer.    Review of Systems:  A comprehensive review of 10 systems was negative except for pertinent positives noted in HPI.    Physical Exam:    VITAL SIGNS:  There were no vitals taken for this visit. Reviewed in Epic  ECOG Performance Status: 2  GENERAL: Chronically ill-appearing, difficulty sitting up straight  HEAD: Normocephalic and atraumatic.  EYES: Conjunctivae are normal. No scleral icterus.  MOUTH/THROAT: Oropharynx is clear and moist.  No mucosal lesions.  NECK: Supple, no thyromegaly.  LYMPHATICS: No palpable cervical, supraclavicular, or axillary adenopathy.  CARDIOVASCULAR: Normal rate, regular rhythm and normal heart sounds.  Exam reveals no gallop and no friction rub.  No murmur heard.  PULMONARY/CHEST: Posterior inferior bibasilar crackles  ABDOMINAL:  Soft. There is no distension. There is  no tenderness. There is no rebound and no guarding.  MUSCULOSKELETAL: No clubbing, cyanosis, or lower extremity edema.    PSYCHIATRIC: Alert and oriented.  Normal mood and affect.  NEUROLOGIC: No focal motor deficit. Gait not observed.  SKIN: Skin is warm, dry, and intact.      Results/Orders:    Lab on 09/03/2017   Component Date Value Ref Range Status   ??? PSA 09/03/2017 76.20* 0.00 - 4.00 ng/mL Final   ??? Sodium 09/03/2017 143  135 - 145 mmol/L Final   ??? Potassium 09/03/2017 4.8  3.5 - 5.0 mmol/L Final   ??? Chloride 09/03/2017 109* 98 - 107 mmol/L Final   ??? CO2 09/03/2017 22.0  22.0 - 30.0 mmol/L Final   ??? BUN 09/03/2017 15  7 - 21 mg/dL Final   ??? Creatinine 09/03/2017 1.64* 0.70 - 1.30 mg/dL Final   ??? BUN/Creatinine Ratio 09/03/2017 9   Final   ??? EGFR MDRD Non Af Amer 09/03/2017 43* >=60 mL/min/1.40m2 Final   ??? EGFR MDRD Af Amer 09/03/2017 53* >=60 mL/min/1.59m2 Final   ??? Anion Gap 09/03/2017 12  9 - 15 mmol/L Final   ??? Glucose 09/03/2017 92  65 - 179 mg/dL Final   ??? Calcium 08/22/7251 8.9  8.5 - 10.2 mg/dL Final   ??? Albumin 66/44/0347 3.2* 3.5 - 5.0 g/dL Final   ??? Total Protein 09/03/2017 6.1* 6.5 - 8.3 g/dL Final   ??? Total Bilirubin 09/03/2017 0.8  0.0 - 1.2 mg/dL Final   ??? AST 42/59/5638 101* 19 - 55 U/L Final   ??? ALT 09/03/2017 47  19 - 72 U/L Final   ??? Alkaline Phosphatase 09/03/2017 550* 38 - 126 U/L Final   ??? Phosphorus 09/03/2017 3.9  2.9 - 4.7 mg/dL Final   ??? WBC 75/64/3329 8.6  4.5 - 11.0 10*9/L Final   ??? RBC 09/03/2017 3.47* 4.50 - 5.90 10*12/L Final   ??? HGB 09/03/2017 11.6* 13.5 - 17.5 g/dL Final   ??? HCT 51/88/4166 36.3* 41.0 - 53.0 % Final   ??? MCV 09/03/2017 104.6* 80.0 - 100.0 fL Final   ??? MCH 09/03/2017 33.6  26.0 - 34.0 pg Final   ??? MCHC 09/03/2017 32.1  31.0 - 37.0 g/dL Final   ??? RDW 02/17/1600 18.0* 12.0 - 15.0 % Final   ??? MPV 09/03/2017 7.9  7.0 - 10.0 fL Final   ??? Platelet 09/03/2017 287  150 - 440 10*9/L Final   ??? Absolute Neutrophils 09/03/2017 6.5  2.0 - 7.5 10*9/L Final   ??? Absolute Lymphocytes 09/03/2017 0.8* 1.5 - 5.0 10*9/L Final   ??? Absolute Monocytes 09/03/2017 0.9* 0.2 - 0.8 10*9/L Final   ??? Absolute Eosinophils 09/03/2017 0.1  0.0 - 0.4 10*9/L Final   ??? Absolute Basophils 09/03/2017 0.0  0.0 - 0.1 10*9/L Final   ??? Large Unstained Cells 09/03/2017 3  0 - 4 % Final   ??? Macrocytosis 09/03/2017 Marked* Not Present Final   ??? Anisocytosis 09/03/2017 Slight* Not Present Final   ??? Smear Review Comments 09/03/2017 See Comment* Undefined Final    Slide reviewed         PSA   Date Value Ref Range Status   09/03/2017 76.20 (H) 0.00 - 4.00 ng/mL Final   08/06/2017 70.80 (H) 0.00 - 4.00 ng/mL Final   07/09/2017 62.50 (H) 0.00 - 4.00 ng/mL Final   07/02/2017 61.10 (H) 0.00 - 4.00 ng/mL Final   05/21/2017 60.50 (H) 0.00 - 4.00 ng/mL Final   04/23/2017 64.20 (H)  0.00 - 4.00 ng/mL Final   03/26/2017 50.70 (H) 0.00 - 4.00 ng/mL Final   02/21/2017 67.60 (H) 0.00 - 4.00 ng/mL Final   01/10/2017 60.80 (H) 0.00 - 4.00 ng/mL Final   10/11/2016 28.10 (H) 0.00 - 4.00 ng/mL Final   Baseline PSA prior to enzalutamide is 32.6 on 07/19/2016      Testosterone   Date Value Ref Range Status   06/21/2016 10 (L) 179 - 756 ng/dL Final   16/05/9603 540 (L) 179 - 756 ng/dL Final   98/06/9146 84 (L) 179 - 756 ng/dL Final              Orders placed or performed in visit on 09/03/17   ??? CBC w/ Differential   ??? PSA, Diagnostic   ??? Comprehensive Metabolic Panel   ??? Phosphorus Level     Pathology:  Final Diagnosis    ?? A:?? Prostate,?? Right base, needle biopsy ??  - Prostatic adenocarcinoma, Gleason score 4 + 3 = 7 (Grade group 3) involving 2 of 2 cores, approximately 12 and 12 mm in discontinuous linear extent, approximately 80% of total core length.    B:?? Prostate,?? Right mid, needle biopsy   - Prostatic adenocarcinoma, Gleason score 4 + 3 = 7 (Grade group 3) involving 2 of 2 cores, approximately 11 and 7 mm in discontinuous linear extent, approximately 70% of total core length.    C:?? Prostate,?? Right apex, needle biopsy   - Prostatic adenocarcinoma, Gleason score 3 + 5 = 8 (Grade group 4) involving 2 of 2 cores, approximately 9 and 14 mm in discontinuous linear extent, approximately 80% of total core length. ??  - Perineural invasion identified in this case.    D:?? Prostate,?? Left base, needle biopsy   - Prostatic adenocarcinoma, Gleason score 4 + 5 = 9 (Grade group 5) involving 2 of 2 cores, approximately 13 and 13 mm in discontinuous linear extent, approximately 90% of total core length.     E:?? Prostate,?? Left mid, needle biopsy   - Prostatic adenocarcinoma, Gleason score 4 + 4 = 8 (Grade group 4) involving 2 of 2 cores, approximately 5 and 15 mm in discontinuous linear extent, approximately 70% of total core length.     F:?? Prostate,?? Left apex, needle biopsy   - Prostatic adenocarcinoma, Gleason score 4 + 5 = 9 (Grade group 5) involving 2 of 2 cores, approximately 9 and 11 mm in linear extent, approximately 90% of total core length. ??  - Suspicious for extraprostatic extension         Imaging results:  PET scan in 10/15/2014  IMPRESSION:  Markedly hypermetabolic right paratracheal lesion, consistent with  the patient's known malignancy. This is associated with  hypermetabolic metastases in the right hilum and scattered sclerotic  hypermetabolic bone metastases.    The cystic lesion in the pancreatic head is not hypermetabolic on  PET imaging. As such, this finding may not be related to metastatic  disease. Attention on followup imaging is suggested.    Mild FDG uptake in both adrenal glands with asymmetric appearance  and no underlying nodule or mass. Uptake felt to be physiologic, but  attention to these areas on follow-up is also recommended.    No evidence for hypermetabolic disease in the liver on today's  Study.    MRI of brain 08/30/2016  No evidence of intracranial metastasis      Bone scan 08/30/2016  Diffuse osseous metastatic disease, predominantly throughout the axial skeleton.  Bone scan 02/07/2017  Impression    Diffuse osseous metastatic disease predominantly in the axial skeleton, similar to prior. The only obvious new focus is in the right posterior ninth rib    X ray of femur R 02/21/2017  Impression      1. Unchanged severe right hip osteoarthrosis likely secondary to femoral head collapse related to osteonecrosis.    2. Likely distal femoral bone infarct. Otherwise, no focal lytic or blastic bone lesion.        MRI T/L Spine (performed at [facility] 03/2017)  Diffuse osseous mets, possible T10 pathologic fracture without height loss. No canal stenosis. Mild L3-4, L4-5 neural foraminal narrowing

## 2017-09-04 NOTE — Telephone Encounter (Signed)
Scheduled appt per 1/16 los - Gave patient Avs and calender per los.

## 2017-09-04 NOTE — Progress Notes (Signed)
[place] [phone]   Fax:([phone]) [place], MD [place] [address]  DIAGNOSIS:  1) Stage IV (T2b, N2, M1b) non-small cell lung cancer, adenocarcinoma with negative EGFR mutation and negative gene translocation presented with a large right hilar mass as well as mediastinal lymphadenopathy and metastatic disease to the liver, bone and pancreas diagnosed in February 2016. 2) prostate adenocarcinoma diagnosed at [facility] with Gleason score 9 (4+5). 3) treatment with Radium 223 for prostate cancer at [facility] on 02/21/2017  PRIOR THERAPY:  1) Systemic chemotherapy with carboplatin for AUC of 5 and Alimta 500 MG/M2 every 3 weeks. Status post 6 cycles. 2) Enzalutamide for prostate cancer at [facility] 3) Immunotherapy with Nivolumab 240 MG every 2 weeks, status post 48 cycles. 4) Radium 223 monthly at [facility], S/p 2 cycles. 5) Immunotherapy with Nivolumab 480 MG every 4 weeks, first cycle 04/10/2017. Status post one cycle.  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  CURRENT THERAPY:  Resuming immunotherapy with Nivolumab 240 mg IV every 2 weeks.  First dose July 24, 2017.  Status post 3 cycles.  INTERVAL HISTORY: Francisco Love 57 y.o. male returns to the clinic today for follow-up visit accompanied by his brother-in-law.  The patient is feeling fine today with no specific complaints except for the chronic back pain.  He has progression of his prostate cancer and he is followed by medical oncologist at [facility].  He was treated with radium 223 in the past.  He is expected to have change in his treatment plan.  He continues to tolerate his treatment with Nivolumab fairly well.  He denied having any chest pain, shortness of breath, cough or hemoptysis.  He denied having any fever or chills.  He had repeat CT scan of the chest, abdomen and pelvis  performed earlier today and he is here for evaluation before starting the next cycle of his treatment.   MEDICAL HISTORY: Past Medical History:  Diagnosis Date  . Chronic fatigue 04/12/2016  . Chronic pain 04/12/2016  . Chronic renal disease, stage III ([place])   . Metastatic cancer ([place]) dx'd 09/2014    ALLERGIES:  has No Known Allergies.  MEDICATIONS:  Current Outpatient Medications  Medication Sig Dispense Refill  . albuterol (PROVENTIL) (2.5 MG/3ML) 0.083% nebulizer solution Take 3 mLs (2.5 mg total) by nebulization every 2 (two) hours as needed for wheezing. 75 mL 12  . AMITIZA 24 MCG capsule Take 24 mcg by mouth 2 (two) times daily with a meal.     . atorvastatin (LIPITOR) 40 MG tablet Take 40 mg by mouth daily.    . betamethasone dipropionate (DIPROLENE) 0.05 % ointment Apply twice daily to affected areas as needed for itching    . bicalutamide (CASODEX) 50 MG tablet Take 1 tablet (50 mg total) by mouth daily. HOLD UNTIL SEEN BY ONCOLOGY.    . fentaNYL (DURAGESIC - DOSED MCG/HR) 50 MCG/HR Place 50 mcg onto the skin every 3 (three) days.    . folic acid (FOLVITE) 1 MG tablet Take 1 mg by mouth daily.    . furosemide (LASIX) 20 MG tablet Take 1 tablet (20 mg total) by mouth daily as needed for fluid or edema. 30 tablet 0  . lidocaine-prilocaine (EMLA) cream Apply 1 application topically as needed. Apply to port site prior to chemotherapy. 30 g 0  . magnesium oxide (MAG-OX) 400 MG  tablet Take 400 mg by mouth daily.    . mirtazapine (REMERON) 15 MG tablet Take 1 tablet (15 mg total) by mouth at bedtime.    . Nutritional Supplements (ENSURE ACTIVE HIGH PROTEIN PO) Take 1 Can by mouth daily as needed.    Marland Kitchen oxyCODONE (OXY IR/ROXICODONE) 5 MG immediate release tablet Take 1 tablet (5 mg total) by mouth every 4 (four) hours as needed for severe pain. 60 tablet 0  . oxyCODONE (OXYCONTIN) 40 mg 12 hr tablet Take 40 mg by mouth every 12 (twelve) hours.    Marland Kitchen doxycycline (DORYX) 100 MG EC tablet  Take 100 mg by mouth 2 (two) times daily.    . naloxone (NARCAN) nasal spray 4 mg/0.1 mL Place 1 spray into the nose once.    Marland Kitchen oxyCODONE-acetaminophen (PERCOCET) 10-325 MG tablet Take 1-2 tablets by mouth every 4 (four) hours as needed.    . polyethylene glycol (MIRALAX / GLYCOLAX) packet Take 17 g by mouth daily as needed for mild constipation. (Patient not taking: Reported on 07/25/2017) 14 each 0  . predniSONE (DELTASONE) 20 MG tablet Take 20 mg by mouth daily with breakfast.    . prochlorperazine (COMPAZINE) 10 MG tablet Take 1 tablet (10 mg total) by mouth every 6 (six) hours as needed for nausea or vomiting. 30 tablet 1  . senna-docusate (SENOKOT-S) 8.6-50 MG per tablet Take 1 tablet by mouth daily. (Patient not taking: Reported on 07/25/2017)    . sodium bicarbonate 650 MG tablet Take 1 tablet (650 mg total) by mouth daily. 30 tablet 0  . tamsulosin (FLOMAX) 0.4 MG CAPS capsule Take 0.4 mg by mouth daily.     . valACYclovir (VALTREX) 500 MG tablet Take 500 mg by mouth 2 (two) times daily.    [name] 40 MG capsule Take 160 mg by mouth every evening.      No current facility-administered medications for this visit.    Facility-Administered Medications Ordered in Other Visits  Medication Dose Route Frequency Provider Last Rate Last Dose  . iopamidol (ISOVUE-300) 61 % injection             SURGICAL HISTORY:  Past Surgical History:  Procedure Laterality Date  . APPENDECTOMY    . EUS N/A 03/28/2017   Procedure: UPPER ENDOSCOPIC ULTRASOUND (EUS) RADIAL;  Surgeon: Milus Banister, MD;  Location: [facility];  Service: Endoscopy;  Laterality: N/A;  . VIDEO BRONCHOSCOPY N/A 09/21/2014   Procedure: VIDEO BRONCHOSCOPY WITH FLUORO;  Surgeon: Rigoberto Noel, MD;  Location: [place];  Service: Cardiopulmonary;  Laterality: N/A;  . VIDEO BRONCHOSCOPY WITH ENDOBRONCHIAL ULTRASOUND N/A 09/23/2014   Procedure: VIDEO BRONCHOSCOPY WITH ENDOBRONCHIAL ULTRASOUND;  Surgeon: Rigoberto Noel, MD;  Location: [place];  Service: Pulmonary;  Laterality: N/A;    REVIEW OF SYSTEMS:  Constitutional: negative Eyes: negative Ears, nose, mouth, throat, and face: negative Respiratory: negative Cardiovascular: negative Gastrointestinal: negative Genitourinary:negative Integument/breast: negative Hematologic/lymphatic: negative Musculoskeletal:positive for back pain Neurological: negative Behavioral/Psych: negative Endocrine: negative Allergic/Immunologic: negative   PHYSICAL EXAMINATION: General appearance: alert, cooperative, fatigued and no distress Head: Normocephalic, without obvious abnormality, atraumatic Neck: no adenopathy, no JVD, supple, symmetrical, trachea midline and thyroid not enlarged, symmetric, no tenderness/mass/nodules Lymph nodes: Cervical, supraclavicular, and axillary nodes normal. Resp: clear to auscultation bilaterally Back: symmetric, no curvature. ROM normal. No CVA tenderness. Cardio: regular rate and rhythm, S1, S2 normal, no murmur, click, rub or gallop GI: soft, non-tender; bowel sounds normal; no masses,  no organomegaly Extremities: extremities normal, atraumatic,  no cyanosis or edema Neurologic: Alert and oriented X 3, normal strength and tone. Normal symmetric reflexes. Normal coordination and gait   ECOG PERFORMANCE STATUS: 1 - Symptomatic but completely ambulatory  Blood pressure (!) 148/87, pulse 74, temperature 97.8 F (36.6 C), temperature source Oral, resp. rate 19, height 5' 9"  (1.753 m), weight 171 lb 11.2 oz (77.9 kg), SpO2 100 %.  LABORATORY DATA: Lab Results  Component Value Date   WBC 4.5 08/21/2017   HGB 11.5 (L) 08/21/2017   HCT 34.8 (L) 08/21/2017   MCV 102.3 (H) 08/21/2017   PLT 180 08/21/2017      Chemistry      Component Value Date/Time   NA 141 08/21/2017 1359   K 4.4 08/21/2017 1359   CL 114 (H) 05/18/2017 0500   CO2 23 08/21/2017 1359   BUN 13.1 08/21/2017 1359   CREATININE 1.7 (H) 08/21/2017 1359      Component Value Date/Time    CALCIUM 8.8 08/21/2017 1359   ALKPHOS 374 (H) 08/21/2017 1359   AST 84 (H) 08/21/2017 1359   ALT 42 08/21/2017 1359   BILITOT 0.50 08/21/2017 1359       RADIOGRAPHIC STUDIES: Ct Chest W Contrast  Result Date: 09/04/2017 CLINICAL DATA:  Non-small cell right lung cancer. History of prostate cancer. EXAM: CT CHEST, ABDOMEN, AND PELVIS WITH CONTRAST TECHNIQUE: Multidetector CT imaging of the chest, abdomen and pelvis was performed following the standard protocol during bolus administration of intravenous contrast. CONTRAST:  66m ISOVUE-300 IOPAMIDOL (ISOVUE-300) INJECTION 61% COMPARISON:  06/12/2017 FINDINGS: CT CHEST FINDINGS Cardiovascular: The heart size is normal. No pericardial effusion. Right Port-A-Cath tip is at the SVC/RA junction. Mediastinum/Nodes: Right paratracheal mass shows continued interval progression, measuring 7.2 x 5.7 cm today compared to 5.8 x 3.8 cm previously. 14 mm subcarinal lymph node measured previously is stable at 14 mm. 9 mm AP window lymph node measured on the prior study is stable at 9 mm. The esophagus has normal imaging features. There is no axillary lymphadenopathy. Lungs/Pleura: Centrilobular and paraseptal emphysema noted. Dependent ground-glass attenuation in the lower lungs is similar to prior. The nodule within the cluster of bulla in the posterior right lower lobe is similar measuring 4 x 6 mm today compared to 5 x 7 mm previously. 5 mm nodule superior segment left lower lobe (image 55 series 4) is stable. 6 mm calcified nodule in the anterior left upper lobe (image 55) is similar to prior. A new 7 x 11 mm ill-defined nodule is seen in the left lower lobe on image 94 of series 4. Mild posterior pleural thickening in each hemithorax is similar to prior. Musculoskeletal: Similar appearance of bony metastatic disease in the thoracic spine, sternum, ribs, in both scapula. CT ABDOMEN PELVIS FINDINGS Hepatobiliary: 8 x 9 mm subcapsular lesion in the lateral segment  left liver (image 46 series 2) is stable. No other focal parenchymal abnormality within the liver. There is no evidence for gallstones, gallbladder wall thickening, or pericholecystic fluid. Similar mild distention of the common duct hitting into the head of the pancreas. Pancreas: Irregular hypoattenuating lesion in the head of pancreas with adjacent dystrophic calcification measures similar to slightly larger today at 2.8 x 1.9 cm compared to 2.4 x 1.8 cm previously. The main pancreatic duct is distended in the head of the pancreas with abrupt termination at the level of the lesion. Spleen: No splenomegaly. No focal mass lesion. Adrenals/Urinary Tract: No adrenal nodule or mass. Atrophy of the left kidney again noted. No  suspicious renal mass. No hydroureteronephrosis. The urinary bladder appears normal for the degree of distention. Stomach/Bowel: Stomach is nondistended. No gastric wall thickening. No evidence of outlet obstruction. Duodenum is normally positioned as is the ligament of Treitz. No small bowel wall thickening. No small bowel dilatation. The terminal ileum is normal. The appendix is not visualized, but there is no edema or inflammation in the region of the cecum. No gross colonic mass. No colonic wall thickening. No substantial diverticular change. Vascular/Lymphatic: There is abdominal aortic atherosclerosis without aneurysm. Similar appearance occlusion proximal left common iliac artery There is no gastrohepatic or hepatoduodenal ligament lymphadenopathy. No intraperitoneal or retroperitoneal lymphadenopathy. No pelvic sidewall lymphadenopathy. Reproductive: The prostate gland and seminal vesicles have normal imaging features. Other: No intraperitoneal free fluid. Musculoskeletal: Widespread bony metastatic involvement again noted, similar to prior. Compression deformity of L5 is progressive in the interval and there is new compression fracture at T12. Degenerative changes are noted in the hips  bilaterally. Stable left groin hernia containing only fat. IMPRESSION: 1. Continued progression of the right paratracheal mass with stable appearance of borderline mediastinal lymphadenopathy. 2. Similar appearance of pre-existing pulmonary nodules. A new 11 mm ill-defined nodule is identified in the left lower lobe and attention on follow-up recommended. 3. Similar appearance of the heterogeneous cystic lesion in the head of pancreas causing a degree of main pancreatic duct obstruction. As noted previously, this lesion was present on a PET-CT of 10/15/2014 and showed no hypermetabolism at that time. The lesion has been relatively stable in the 3 year interval since that PET-CT. 4. Similar appearance widespread bony metastatic involvement. Further progression of the L5 collapse with new compression deformity at T12 resulting in 50-75% loss of vertebral body height. 5. Aortic Atherosclerois (ICD10-170.0). Proximal left common iliac artery remains occluded. 6.  Emphysema. ([id number]) Electronically Signed   By: Misty Stanley M.D.   On: 09/04/2017 10:44   Ct Abdomen Pelvis W Contrast  Result Date: 09/04/2017 CLINICAL DATA:  Non-small cell right lung cancer. History of prostate cancer. EXAM: CT CHEST, ABDOMEN, AND PELVIS WITH CONTRAST TECHNIQUE: Multidetector CT imaging of the chest, abdomen and pelvis was performed following the standard protocol during bolus administration of intravenous contrast. CONTRAST:  9m ISOVUE-300 IOPAMIDOL (ISOVUE-300) INJECTION 61% COMPARISON:  06/12/2017 FINDINGS: CT CHEST FINDINGS Cardiovascular: The heart size is normal. No pericardial effusion. Right Port-A-Cath tip is at the SVC/RA junction. Mediastinum/Nodes: Right paratracheal mass shows continued interval progression, measuring 7.2 x 5.7 cm today compared to 5.8 x 3.8 cm previously. 14 mm subcarinal lymph node measured previously is stable at 14 mm. 9 mm AP window lymph node measured on the prior study is stable at 9 mm. The  esophagus has normal imaging features. There is no axillary lymphadenopathy. Lungs/Pleura: Centrilobular and paraseptal emphysema noted. Dependent ground-glass attenuation in the lower lungs is similar to prior. The nodule within the cluster of bulla in the posterior right lower lobe is similar measuring 4 x 6 mm today compared to 5 x 7 mm previously. 5 mm nodule superior segment left lower lobe (image 55 series 4) is stable. 6 mm calcified nodule in the anterior left upper lobe (image 55) is similar to prior. A new 7 x 11 mm ill-defined nodule is seen in the left lower lobe on image 94 of series 4. Mild posterior pleural thickening in each hemithorax is similar to prior. Musculoskeletal: Similar appearance of bony metastatic disease in the thoracic spine, sternum, ribs, in both scapula. CT ABDOMEN PELVIS FINDINGS Hepatobiliary: 8  x 9 mm subcapsular lesion in the lateral segment left liver (image 46 series 2) is stable. No other focal parenchymal abnormality within the liver. There is no evidence for gallstones, gallbladder wall thickening, or pericholecystic fluid. Similar mild distention of the common duct hitting into the head of the pancreas. Pancreas: Irregular hypoattenuating lesion in the head of pancreas with adjacent dystrophic calcification measures similar to slightly larger today at 2.8 x 1.9 cm compared to 2.4 x 1.8 cm previously. The main pancreatic duct is distended in the head of the pancreas with abrupt termination at the level of the lesion. Spleen: No splenomegaly. No focal mass lesion. Adrenals/Urinary Tract: No adrenal nodule or mass. Atrophy of the left kidney again noted. No suspicious renal mass. No hydroureteronephrosis. The urinary bladder appears normal for the degree of distention. Stomach/Bowel: Stomach is nondistended. No gastric wall thickening. No evidence of outlet obstruction. Duodenum is normally positioned as is the ligament of Treitz. No small bowel wall thickening. No small  bowel dilatation. The terminal ileum is normal. The appendix is not visualized, but there is no edema or inflammation in the region of the cecum. No gross colonic mass. No colonic wall thickening. No substantial diverticular change. Vascular/Lymphatic: There is abdominal aortic atherosclerosis without aneurysm. Similar appearance occlusion proximal left common iliac artery There is no gastrohepatic or hepatoduodenal ligament lymphadenopathy. No intraperitoneal or retroperitoneal lymphadenopathy. No pelvic sidewall lymphadenopathy. Reproductive: The prostate gland and seminal vesicles have normal imaging features. Other: No intraperitoneal free fluid. Musculoskeletal: Widespread bony metastatic involvement again noted, similar to prior. Compression deformity of L5 is progressive in the interval and there is new compression fracture at T12. Degenerative changes are noted in the hips bilaterally. Stable left groin hernia containing only fat. IMPRESSION: 1. Continued progression of the right paratracheal mass with stable appearance of borderline mediastinal lymphadenopathy. 2. Similar appearance of pre-existing pulmonary nodules. A new 11 mm ill-defined nodule is identified in the left lower lobe and attention on follow-up recommended. 3. Similar appearance of the heterogeneous cystic lesion in the head of pancreas causing a degree of main pancreatic duct obstruction. As noted previously, this lesion was present on a PET-CT of 10/15/2014 and showed no hypermetabolism at that time. The lesion has been relatively stable in the 3 year interval since that PET-CT. 4. Similar appearance widespread bony metastatic involvement. Further progression of the L5 collapse with new compression deformity at T12 resulting in 50-75% loss of vertebral body height. 5. Aortic Atherosclerois (ICD10-170.0). Proximal left common iliac artery remains occluded. 6.  Emphysema. ([id number]) Electronically Signed   By: Misty Stanley M.D.   On:  09/04/2017 10:44   ASSESSMENT AND PLAN:  This is a very pleasant 57 years old African-American male with metastatic non-small cell lung cancer, adenocarcinoma status post induction systemic chemotherapy was carboplatin and Alimta. He was started on second line treatment with Nivolumab 240 mg IV every 2 weeks is status post 48 cycles. He is currently on treatment with Nivolumab 480 MG IV every 4 weeks is status post 1 cycle. This treatment with the high dose Nivolumab was discontinued secondary to immunotherapy mediated hepatitis. He resumed his treatment with Nivolumab 240 mg IV every 2 weeks status post 3 cycles.  Has been tolerating this treatment much better. He had repeat CT scan of the chest, abdomen and pelvis performed earlier today.  The results of the scan were not available at the time of the visit.  I recommended for him to continue his current treatment with Nivolumab  and he will proceed with cycle #4 today. The scan results that came later today showed continuous progression of the right paratracheal mass with a stable appearance of the borderline mediastinal lymphadenopathy. The patient will continue on Nivolumab for a few more cycles and if the upcoming restaging scan showed further increase in his disease, would consider switching to a different systemic regimen or palliative care. For pain management, this is likely from his prostate adenocarcinoma.  He will continue his current pain medication by the pain clinic at [facility]. The patient voices understanding of current disease status and treatment options and is in agreement with the current care plan. All questions were answered. The patient knows to call the clinic with any problems, questions or concerns. We can certainly see the patient much sooner if necessary.  Disclaimer: This note was dictated with voice recognition software. Similar sounding words can inadvertently be transcribed and may not be corrected upon  review.

## 2017-09-04 NOTE — Progress Notes (Signed)
Per Julien Nordmann it is okay to treat pt today with chemo and todays AST and creatinine.

## 2017-09-04 NOTE — Patient Instructions (Signed)
[facility] Discharge Instructions for Patients Receiving Chemotherapy  Today you received the following chemotherapy agents :  Opdivo.  To help prevent nausea and vomiting after your treatment, we encourage you to take your nausea medication as prescribed.   If you develop nausea and vomiting that is not controlled by your nausea medication, call the clinic.   BELOW ARE SYMPTOMS THAT SHOULD BE REPORTED IMMEDIATELY:  *FEVER GREATER THAN 100.5 F  *CHILLS WITH OR WITHOUT FEVER  NAUSEA AND VOMITING THAT IS NOT CONTROLLED WITH YOUR NAUSEA MEDICATION  *UNUSUAL SHORTNESS OF BREATH  *UNUSUAL BRUISING OR BLEEDING  TENDERNESS IN MOUTH AND THROAT WITH OR WITHOUT PRESENCE OF ULCERS  *URINARY PROBLEMS  *BOWEL PROBLEMS  UNUSUAL RASH Items with * indicate a potential emergency and should be followed up as soon as possible.  Feel free to call the clinic should you have any questions or concerns. The clinic phone number is [phone].  Please show the CHEMO ALERT CARD at check-in to the Emergency Department and triage nurse.   

## 2017-09-05 NOTE — Unmapped (Signed)
OUTPATIENT ONCOLOGY PALLIATIVE CARE    Principal Diagnosis: Mr. Wheeling is a 57 y/o M with unfortunately two known metastatic cancers including metastatic lung cancer dx 2/16 on nivolumab and metastatic prostate cancer dx 12/16 with known bony dx and T10 pathologic fx castrate resistant now on Radium 223 and enzalutamide who is referred to Palliative Care Clinic for help with ongoing pain/symptom management and GOC discussion.     Assessment/Plan:   1.  Malignant pain: primarily lower back, L posterior hip. Pain is stable and opioid usage is stable. Sister and brother-in-law are assisting with managing opioids due to patient's low health literacy and prior confusion over proper opioid usage. Since they have been more involved, his pain has been better controlled and opioid counts have been appropriate. Patient admits to taking up to 3-4 tabs of oxy/APAP per dose - discussed with patient the risks of over-usage of opioids and instructed him to decrease to the 2 tabs/dose as prescribed. Reminded him he could use more often at this lower dose. He verbalizes understanding   - Continue Oxycontin 40 mg po BID   - Continue oxycodone/APAP 10/325 - 1-2 tabs q4h prn. Max of 8 tabs/day. Instructed patient to not go above 2 tablets/dose    2. Goals of Care/Advanced Care Planning - did not address at this visit   Previously Discussed with Mr. Mazzocco out role in support him during this journey. He expressed sadness about his prognosis but also hope that he actually has many years left. His goals right now are to live as long as he can, but also enjoy the little things like visiting with family, sitting in the outdoors and staying independent as much as he can. He would like his sister, [name], to be his HCPOA but has not formally filled out the paperwork yet. He does not want to be a burden on his family and if he becomes dependent for his ADLs he would like to go to a nursing home as he does not want them to have to take care of him. When he dies he would like to go peacefully in his sleep if possible. He has a strong faith and believes Jesus gives him only what he can handle and knows when it is his time. Does not have any formal Ads in place.        Plan:   -- continue to discuss GOC at each visit, today we mostly explored his current feelings and supported his hopes for the future.    -- refer to patient relations for completion of formal Ads, would like sister [name] to be HCPOA    3. Controlled substances risk management.   - Patient has a signed pain medication agreement with Outpatient Palliative Care, completed on 05/21/17, as per standard of care.   - [place] database was reviewed today and it was appropriate.    - Urine drug screen was performed 05/21/17. Findings: positive for oxymorphone only (metabolite of oxycodone)   - Patient has received information about safe storage and administration of medications.   - Patient has received a prescription for narcan; Brother-in-law was educated on its use    F/u: 4 weeks in conjunction with visit with Dr. Philomena Course  ----------------------------------------  Referring Oncology Provider: Dr. Philomena Course (Prostate Cancer provider) and Dr. Sofie Hartigan (lung cancer provider)  PCP: Lonie Peak, [facility]      HPI:  Mr. Kuenzel is a 57 y/o M with unfortunately two known metastatic cancers including metastatic lung cancer dx 2/16 on  nivolumab and metastatic prostate cancer dx 12/16 with known bony dx and T10 pathologic fx castrate resistant now on Radium 223 and enzalutamide who is referred to Palliative Care Clinic for help with ongoing pain/symptom management and GOC discussion. Recently his PSA has increased from 50.7 to 64.2 and prostate cancer physician believes he has prognosis of 1 month to 1 year but continuing radium 223 infusion, enzalutamide and lupron    Current cancer-directed therapy: Currently receiving Q2 week nivolumab , radium 223, lupron and enzalutamide    Interval history, 12/18 MDK:   - General: here today with brother-in-law and feeling well overall  - Pain primarily lower back and L posterior hip that radiates down LLE. Pain is unchanged since last visit and he denies new sources of pain.  He uses Oxycontin 40 mg BID as prescribed - reports no extra doses since he was seen last. He's taking oxycodone/APAP as up to 3-4 tabs per dose, BID to TID on average. He estimates his average is 8 tabs/day. Reports tolerating well and he denies excess sedation    - Bowels are moving every other day with miralax daily  - Appetite: did not address at this visit  - Sleep: significantly improved with better pain relief    Palliative Performance Scale: 70% - Ambulation: Reduced / unable to do normal work, some evidence of disease / Self-Care: Full / Intake: Normal or reduced / Level of Conscious: Full    Coping/Support Issues:   Denies any support or coping issues. Has strong family, friend and church support    Goals of Care:   Mr. Weihe reports that his main goal is to live as long as he can. He would also like to live to next summer to enjoy the simple things in life including the fresh air, watching cars go by and taking care of himself. If he were to become to sick to take care of himself he would like to go to a nursing home so he would not be a burden for his family. If his time comes he would like to go in his sleep. He does not want surgery but otherwise is ok to continue aggressive treatments at this time, no formal Ads completed or in place    Social History:   Mr. Statz lives in [place] alone. However his sister [name] and [place] Darryl are actively involved in his care and check on him every day. His sister does most of his medications and his BIL takes him to all of his appointments. He uses a walker to get around, able to do his own ADLs but gets help with cooking, laundry and does not drive. He uses [employer]2 24/7. He used to [employer], also worked as a Curator. Denies EtOH, previous smoker but quit, denies drugs.    Advance Care Planning:   Does not have any formal ADs in place. Would like his sister, [name], to be his formal health care decision maker but has not completed the paperwork for his.   HCPOA: none  Natural surrogate decision maker: sister  Living Will: none  ACP note: none    Objective       Oncology History    --In 04/2014, PSA elevated to 56.4. DRE normal.  Prostate bx scheduled, but canceled by pt.  --In 09/2014, diagnosed with nonsmall cell lung cancer, stage IV (T2b, N2, M1b) with a large R hilar mass, mediastinal adenopathy, mets to bones. No uptake in liver or pancreas on PET scan. Insufficient  material for molecular testing.  Guardant360 for ctDNA was negative.  Pt was subsequently treated with carboplatin/Alimta with initial good response.  Then disease progression.  On second line therapy with nivolumab.  --In 01/2015, PSA 62.2.  Seen in Urology clinic and referred to [facility] for possibility of prostate cancer.  After discussions about various options, underwent bone bx in 05/2015, nondiagnostic. PSA down to 23.7  --In 07/2015, prostate bx showed Gleason 4+5=9 adenocarcinoma.  --In 08/2015, androgen deprivation therapy with Lupron started.  PSA 354.  --In 11/2015, Lupron continued. PSA down to 1.7, which was the nadir. Subsequently, PSA began rising.  --In 06/2016, PSA up to 32.  Enzalutamide started.  MRI of brain neg. Bone scan stable.  --In 02/2017, PSA rise on enzalutamide. Bone scan stable. Radium 223 added  --In 07/2017, s/p 6 infusions of Radium 223.        Lung cancer, primary, with metastasis from lung to other site (CMS-HCC) (Resolved)    10/11/2014 Initial Diagnosis     Lung cancer, primary, with metastasis from lung to other site [facility])           Prostate cancer (CMS-HCC)    07/27/2015 Initial Diagnosis     Prostate cancer [facility] [place])            Patient Active Problem List   Diagnosis   ??? Derangement of right patella   ??? Hypertension, benign   ??? Osteoarthrosis   ??? Chronic renal disease, stage 3, moderately decreased glomerular filtration rate (GFR) between 30-59 mL/min/1.73 square meter (CMS-HCC)   ??? Lichen planus   ??? Weakness   ??? Lung mass   ??? Mediastinal lymphadenopathy   ??? Malignant neoplasm metastatic to bone (CMS-HCC)   ??? Pain of metastatic malignancy   ??? Non-small cell carcinoma of lung (CMS-HCC)   ??? Protein-calorie malnutrition (CMS-HCC)   ??? Acute respiratory failure (CMS-HCC)   ??? Shortness of breath   ??? Tobacco dependence syndrome   ??? Prostate cancer (CMS-HCC)       Past Medical History:   Diagnosis Date   ??? Lung cancer (CMS-HCC)    ??? On supplemental oxygen therapy        Past Surgical History:   Procedure Laterality Date   ??? APPENDECTOMY     ??? PR BRNCHSC EBUS GUIDED SAMPL 3/> NODE STATION/STRUX N/A 05/18/2015    Procedure: Bronch, Rigid Or Flexible, Including Fluoro Guidance, When Performed; W Ebus Guided Transtracheal And/Or Transbronchial Sampling, 3 Or More Mediastinal And/Or Hilar Lymph Node Stations Or Structures;  Surgeon: Mathis Bud, MD;  Location: [facility]IN [facility];  Service: Pulmonary   ??? small intestines removed per pt         Current Outpatient Prescriptions   Medication Sig Dispense Refill   ??? albuterol 2.5 mg /3 mL (0.083 %) nebulizer solution Inhale 2.5 mg by nebulization every four (4) hours as needed.      ??? AMITIZA 24 mcg capsule Take 24 mcg by mouth daily with breakfast.      ??? atorvastatin (LIPITOR) 40 MG tablet      ??? diphenhydrAMINE (BENADRYL) 25 mg tablet Take 25 mg by mouth every six (6) hours as needed.      ??? enzalutamide (XTANDI) 40 mg cap capsule Take 4 capsules (160 mg total) by mouth daily. 120 capsule 11   ??? folic acid (FOLVITE) 1 MG tablet Take 1 mg by mouth.     ??? food supplemt, lactose-reduced (ENSURE PLUS) 0.05-1.5 gram-kcal/mL liquid Take 237  mL by mouth daily.      ??? furosemide (LASIX) 20 MG tablet Take 20 mg by mouth.     ??? lactose-reduced food (ENSURE ENLIVE ORAL) Take 237 mL by mouth.     ??? levocetirizine (XYZAL) 5 MG tablet Take 5 mg by mouth.     ??? lidocaine-prilocaine (EMLA) cream Place on port site the morning of chemo     ??? magnesium oxide (MAG-OX) 400 mg tablet Take 400 mg by mouth.     ??? metoprolol tartrate (LOPRESSOR) 25 MG tablet Take 12.5 mg by mouth.     ??? mirtazapine (REMERON) 15 MG tablet Take 15 mg by mouth nightly.      ??? naloxone (NARCAN) 4 mg nasal spray One spray in either nostril once for known/suspected opioid overdose. May repeat every 2-3 minutes in alternating nostril til EMS arrives 1 each 0   ??? oxyCODONE (OXYCONTIN) 40 mg 12 hr crush resistant tablet Take 1 tablet (40 mg total) by mouth every twelve (12) hours. 60 tablet 0   ??? oxyCODONE-acetaminophen (PERCOCET) 10-325 mg per tablet Take 1-2 tablets by mouth every four (4) hours as needed for pain. Maximum of 8 tablets per day. 240 tablet 0   ??? pantoprazole (PROTONIX) 40 MG tablet Take 40 mg by mouth.     ??? polyethylene glycol (MIRALAX) 17 gram packet Take 17 g by mouth daily.      ??? predniSONE (DELTASONE) 10 MG tablet      ??? prochlorperazine (COMPAZINE) 10 MG tablet Take 10 mg by mouth every eight (8) hours as needed.      ??? senna-docusate (PERICOLACE) 8.6-50 mg Take 1 tablet by mouth.     ??? sodium bicarbonate 650 mg tablet Take 650 mg by mouth.     ??? SUMAtriptan (IMITREX) 25 MG tablet      ??? tamsulosin (FLOMAX) 0.4 mg capsule Take 1 capsule (0.4 mg total) by mouth daily. 30 capsule 11   ??? valACYclovir (VALTREX) 1000 MG tablet Take 1 tablet (1,000 mg total) by mouth daily. 30 tablet 0   ??? betamethasone dipropionate (DIPROLENE) 0.05 % ointment Apply twice daily to affected areas as needed for itching (Patient not taking: Reported on 08/06/2017) 45 g 2   ??? doxycycline (VIBRA-TABS) 100 MG tablet Take 1 tablet (100 mg total) by mouth daily. (Patient not taking: Reported on 07/09/2017) 30 tablet 0   ??? fluocinolone 0.01 % external oil Bid to back (Patient not taking: Reported on 07/09/2017) 120 mL 2   ??? polyethylene glycol (MIRALAX) 17 gram/dose powder Take 17 g by mouth two (2) times a day as needed. 595 g 11   ??? triamcinolone (KENALOG) 0.1 % ointment Apply to arms twice a day until clear, then stop. (Patient not taking: Reported on 08/06/2017) 30 g 1     No current facility-administered medications for this visit.        Allergies: No Known Allergies    Family History:  Cancer-related family history includes Cancer in his brother. There is no history of Melanoma.  indicated that the status of his mother is unknown. He indicated that the status of his brother is unknown. He indicated that the status of his neg hx is unknown.         Lab Results   Component Value Date    CREATININE 1.64 (H) 09/03/2017     Lab Results   Component Value Date    ALKPHOS 550 (H) 09/03/2017    BILITOT 0.8 09/03/2017  PROT 6.1 (L) 09/03/2017    ALBUMIN 3.2 (L) 09/03/2017    ALT 47 09/03/2017    AST 101 (H) 09/03/2017        I personally spent over half of a total 15 minutes in counseling and discussion with the patient as described above.    Jenkins Rouge, PharmD, BCOP, CPP  Outpatient Oncology Palliative Care

## 2017-09-10 ENCOUNTER — Ambulatory Visit: Admit: 2017-09-10 | Discharge: 2017-09-18 | Payer: MEDICARE

## 2017-09-10 DIAGNOSIS — C7951 Secondary malignant neoplasm of bone: Principal | ICD-10-CM

## 2017-09-10 DIAGNOSIS — C61 Malignant neoplasm of prostate: Principal | ICD-10-CM

## 2017-09-11 NOTE — Unmapped (Signed)
-----   Message from Malvin Johns sent at 09/11/2017  1:23 PM EST -----  Lanier Ensign,    The patient has been scheduled for 10/10/17 @ 8:40am with Dr. Regino Schultze.    Thanks,  [id number]   ----- Message -----  From: Marica Otter, RN  Sent: 09/11/2017   1:06 PM  To: Malvin Johns    I placed one.  Thank you,  Boneta Lucks  ----- Message -----  From: Malvin Johns  Sent: 09/11/2017  12:38 PM  To: Marica Otter, RN    Lanier Ensign,    I can schedule the patient but I need a referral placed since he's never been seen by rad. Onc.     Thank you,  [id number]   ----- Message -----  From: Marica Otter, RN  Sent: 09/11/2017  12:15 PM  To: Malvin Johns    Hi-can pt please be scheduled for a rad onc appt when he comes to see Dr. Philomena Course on 2/21.    Thanks,  Boneta Lucks

## 2017-09-18 ENCOUNTER — Inpatient Hospital Stay (HOSPITAL_BASED_OUTPATIENT_CLINIC_OR_DEPARTMENT_OTHER): Payer: Medicare Other | Admitting: Internal Medicine

## 2017-09-18 ENCOUNTER — Inpatient Hospital Stay: Payer: Medicare Other

## 2017-09-18 ENCOUNTER — Encounter: Payer: Self-pay | Admitting: Internal Medicine

## 2017-09-18 VITALS — BP 124/87 | HR 91 | Temp 97.8°F | Resp 20 | Ht 69.0 in | Wt 157.0 lb

## 2017-09-18 DIAGNOSIS — C61 Malignant neoplasm of prostate: Secondary | ICD-10-CM

## 2017-09-18 DIAGNOSIS — R5382 Chronic fatigue, unspecified: Secondary | ICD-10-CM

## 2017-09-18 DIAGNOSIS — C801 Malignant (primary) neoplasm, unspecified: Secondary | ICD-10-CM

## 2017-09-18 DIAGNOSIS — C7889 Secondary malignant neoplasm of other digestive organs: Secondary | ICD-10-CM | POA: Diagnosis not present

## 2017-09-18 DIAGNOSIS — Z5112 Encounter for antineoplastic immunotherapy: Secondary | ICD-10-CM

## 2017-09-18 DIAGNOSIS — C7951 Secondary malignant neoplasm of bone: Secondary | ICD-10-CM

## 2017-09-18 DIAGNOSIS — C3491 Malignant neoplasm of unspecified part of right bronchus or lung: Secondary | ICD-10-CM | POA: Diagnosis not present

## 2017-09-18 DIAGNOSIS — Z9221 Personal history of antineoplastic chemotherapy: Secondary | ICD-10-CM

## 2017-09-18 DIAGNOSIS — Z79899 Other long term (current) drug therapy: Secondary | ICD-10-CM | POA: Diagnosis not present

## 2017-09-18 DIAGNOSIS — G893 Neoplasm related pain (acute) (chronic): Secondary | ICD-10-CM

## 2017-09-18 DIAGNOSIS — C787 Secondary malignant neoplasm of liver and intrahepatic bile duct: Secondary | ICD-10-CM

## 2017-09-18 DIAGNOSIS — Z923 Personal history of irradiation: Secondary | ICD-10-CM | POA: Diagnosis not present

## 2017-09-18 LAB — COMPREHENSIVE METABOLIC PANEL
ALBUMIN: 2.3 g/dL — AB (ref 3.5–5.0)
ALK PHOS: 1207 U/L — AB (ref 40–150)
ALT: 81 U/L — ABNORMAL HIGH (ref 0–55)
AST: 238 U/L — AB (ref 5–34)
Anion gap: 9 (ref 3–11)
BILIRUBIN TOTAL: 1.6 mg/dL — AB (ref 0.2–1.2)
BUN: 15 mg/dL (ref 7–26)
CALCIUM: 9.1 mg/dL (ref 8.4–10.4)
CO2: 23 mmol/L (ref 22–29)
Chloride: 106 mmol/L (ref 98–109)
Creatinine, Ser: 1.57 mg/dL — ABNORMAL HIGH (ref 0.70–1.30)
GFR calc Af Amer: 54 mL/min — ABNORMAL LOW (ref >60)
GFR calc non Af Amer: 47 mL/min — ABNORMAL LOW (ref >60)
GLUCOSE: 95 mg/dL (ref 70–140)
Potassium: 4.8 mmol/L (ref 3.5–5.1)
Sodium: 138 mmol/L (ref 136–145)
TOTAL PROTEIN: 7 g/dL (ref 6.4–8.3)

## 2017-09-18 LAB — TSH: TSH: 1.53 u[IU]/mL (ref 0.320–4.118)

## 2017-09-18 LAB — CBC WITH DIFFERENTIAL/PLATELET
BASOS ABS: 0 10*3/uL (ref 0.0–0.1)
BASOS PCT: 0 %
EOS ABS: 0.2 10*3/uL (ref 0.0–0.5)
EOS PCT: 2 %
HCT: 36.1 % — ABNORMAL LOW (ref 38.4–49.9)
Hemoglobin: 11.9 g/dL — ABNORMAL LOW (ref 13.0–17.1)
Lymphocytes Relative: 12 %
Lymphs Abs: 0.9 10*3/uL (ref 0.9–3.3)
MCH: 32.8 pg (ref 27.2–33.4)
MCHC: 33 g/dL (ref 32.0–36.0)
MCV: 99.4 fL — ABNORMAL HIGH (ref 79.3–98.0)
MONO ABS: 0.9 10*3/uL (ref 0.1–0.9)
Monocytes Relative: 13 %
Neutro Abs: 5.3 10*3/uL (ref 1.5–6.5)
Neutrophils Relative %: 73 %
PLATELETS: 220 10*3/uL (ref 140–400)
RBC: 3.63 MIL/uL — ABNORMAL LOW (ref 4.20–5.82)
RDW: 16.7 % — AB (ref 11.0–15.6)
WBC: 7.2 10*3/uL (ref 4.0–10.3)

## 2017-09-18 NOTE — Progress Notes (Signed)
[place] [phone]   Fax:([phone]) Mar[place], MD [place] [address]  DIAGNOSIS:  1) Stage IV (T2b, N2, M1b) non-small cell lung cancer, adenocarcinoma with negative EGFR mutation and negative gene translocation presented with a large right hilar mass as well as mediastinal lymphadenopathy and metastatic disease to the liver, bone and pancreas diagnosed in February 2016. 2) prostate adenocarcinoma diagnosed at [facility] with Gleason score 9 (4+5). 3) treatment with Radium 223 for prostate cancer at [facility] [address] on 02/21/2017  PRIOR THERAPY:  1) Systemic chemotherapy with carboplatin for AUC of 5 and Alimta 500 MG/M2 every 3 weeks. Status post 6 cycles. 2) Enzalutamide for prostate cancer at [facility] 3) Immunotherapy with Nivolumab 240 MG every 2 weeks, status post 48 cycles. 4) Radium 223 monthly at [facility], S/p 2 cycles. 5) Immunotherapy with Nivolumab 480 MG every 4 weeks, first cycle 04/10/2017. Status post one cycle.  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  CURRENT THERAPY:  Resuming immunotherapy with Nivolumab 240 mg IV every 2 weeks.  First dose July 24, 2017.  Status post 4 cycles.  INTERVAL HISTORY: Francisco Love 57 y.o. male returns to the clinic today for follow-up visit accompanied by his brother-in-law.  The patient continues to complain of persistent back pain.  He is currently on pain medication with OxyContin 40 mg p.o. every 12 hours in addition to oxycodone for breakthrough pain at [facility].  He had a recent bone scan that showed multiple metastatic bone disease from his prostate cancer.  His general condition has been declining recently.  He denied having any current chest pain but continues to have shortness of breath at baseline increased with exertion with no cough or hemoptysis.  He denied having any recent weight  loss or night sweats.  He has no nausea, vomiting, diarrhea or constipation.  He is here today for evaluation before starting cycle #5 of his immunotherapy.    MEDICAL HISTORY: Past Medical History:  Diagnosis Date  . Chronic fatigue 04/12/2016  . Chronic pain 04/12/2016  . Chronic renal disease, stage III ([place])   . Metastatic cancer ([place]) dx'd 09/2014    ALLERGIES:  has No Known Allergies.  MEDICATIONS:  Current Outpatient Medications  Medication Sig Dispense Refill  . albuterol (PROVENTIL) (2.5 MG/3ML) 0.083% nebulizer solution Take 3 mLs (2.5 mg total) by nebulization every 2 (two) hours as needed for wheezing. 75 mL 12  . AMITIZA 24 MCG capsule Take 24 mcg by mouth 2 (two) times daily with a meal.     . atorvastatin (LIPITOR) 40 MG tablet Take 40 mg by mouth daily.    . betamethasone dipropionate (DIPROLENE) 0.05 % ointment Apply twice daily to affected areas as needed for itching    . bicalutamide (CASODEX) 50 MG tablet Take 1 tablet (50 mg total) by mouth daily. HOLD UNTIL SEEN BY ONCOLOGY.    Marland Kitchen doxycycline (DORYX) 100 MG EC tablet Take 100 mg by mouth 2 (two) times daily.    . fentaNYL (DURAGESIC - DOSED MCG/HR) 50 MCG/HR Place 50 mcg onto the skin every 3 (three) days.    . folic acid (FOLVITE) 1 MG tablet Take 1 mg by mouth daily.    . furosemide (LASIX) 20 MG tablet Take 1 tablet (20 mg total) by mouth daily as needed for fluid or edema. 30 tablet 0  . lidocaine-prilocaine (EMLA) cream Apply  1 application topically as needed. Apply to port site prior to chemotherapy. 30 g 0  . magnesium oxide (MAG-OX) 400 MG tablet Take 400 mg by mouth daily.    . mirtazapine (REMERON) 15 MG tablet Take 1 tablet (15 mg total) by mouth at bedtime.    . naloxone (NARCAN) nasal spray 4 mg/0.1 mL Place 1 spray into the nose once.    . Nutritional Supplements (ENSURE ACTIVE HIGH PROTEIN PO) Take 1 Can by mouth daily as needed.    Marland Kitchen oxyCODONE (OXY IR/ROXICODONE) 5 MG immediate release tablet Take 1  tablet (5 mg total) by mouth every 4 (four) hours as needed for severe pain. 60 tablet 0  . oxyCODONE (OXYCONTIN) 40 mg 12 hr tablet Take 40 mg by mouth every 12 (twelve) hours.    Marland Kitchen oxyCODONE-acetaminophen (PERCOCET) 10-325 MG tablet Take 1-2 tablets by mouth every 4 (four) hours as needed.    . polyethylene glycol (MIRALAX / GLYCOLAX) packet Take 17 g by mouth daily as needed for mild constipation. 14 each 0  . predniSONE (DELTASONE) 20 MG tablet Take 20 mg by mouth daily with breakfast.    . prochlorperazine (COMPAZINE) 10 MG tablet Take 1 tablet (10 mg total) by mouth every 6 (six) hours as needed for nausea or vomiting. 30 tablet 1  . senna-docusate (SENOKOT-S) 8.6-50 MG per tablet Take 1 tablet by mouth daily.    . sodium bicarbonate 650 MG tablet Take 1 tablet (650 mg total) by mouth daily. 30 tablet 0  . tamsulosin (FLOMAX) 0.4 MG CAPS capsule Take 0.4 mg by mouth daily.     . valACYclovir (VALTREX) 500 MG tablet Take 500 mg by mouth 2 (two) times daily.    [name] 40 MG capsule Take 160 mg by mouth every evening.      No current facility-administered medications for this visit.     SURGICAL HISTORY:  Past Surgical History:  Procedure Laterality Date  . APPENDECTOMY    . EUS N/A 03/28/2017   Procedure: UPPER ENDOSCOPIC ULTRASOUND (EUS) RADIAL;  Surgeon: Milus Banister, MD;  Location: [facility];  Service: Endoscopy;  Laterality: N/A;  . VIDEO BRONCHOSCOPY N/A 09/21/2014   Procedure: VIDEO BRONCHOSCOPY WITH FLUORO;  Surgeon: Rigoberto Noel, MD;  Location: [place];  Service: Cardiopulmonary;  Laterality: N/A;  . VIDEO BRONCHOSCOPY WITH ENDOBRONCHIAL ULTRASOUND N/A 09/23/2014   Procedure: VIDEO BRONCHOSCOPY WITH ENDOBRONCHIAL ULTRASOUND;  Surgeon: Rigoberto Noel, MD;  Location: [place];  Service: Pulmonary;  Laterality: N/A;    REVIEW OF SYSTEMS:  A comprehensive review of systems was negative except for: Constitutional: positive for fatigue Respiratory: positive for dyspnea on  exertion Musculoskeletal: positive for bone pain   PHYSICAL EXAMINATION: General appearance: alert, cooperative, fatigued and no distress Head: Normocephalic, without obvious abnormality, atraumatic Neck: no adenopathy, no JVD, supple, symmetrical, trachea midline and thyroid not enlarged, symmetric, no tenderness/mass/nodules Lymph nodes: Cervical, supraclavicular, and axillary nodes normal. Resp: clear to auscultation bilaterally Back: symmetric, no curvature. ROM normal. No CVA tenderness. Cardio: regular rate and rhythm, S1, S2 normal, no murmur, click, rub or gallop GI: soft, non-tender; bowel sounds normal; no masses,  no organomegaly Extremities: extremities normal, atraumatic, no cyanosis or edema   ECOG PERFORMANCE STATUS: 1 - Symptomatic but completely ambulatory  Blood pressure 124/87, pulse 91, temperature 97.8 F (36.6 C), temperature source Oral, resp. rate 20, height 5' 9"  (1.753 m), weight 157 lb (71.2 kg), SpO2 100 %.  LABORATORY DATA: Lab Results  Component Value  Date   WBC 7.2 09/18/2017   HGB 11.9 (L) 09/18/2017   HCT 36.1 (L) 09/18/2017   MCV 99.4 (H) 09/18/2017   PLT 220 09/18/2017      Chemistry      Component Value Date/Time   NA 139 09/04/2017 0811   NA 141 08/21/2017 1359   K 4.2 09/04/2017 0811   K 4.4 08/21/2017 1359   CL 108 09/04/2017 0811   CO2 18 (L) 09/04/2017 0811   CO2 23 08/21/2017 1359   BUN 12 09/04/2017 0811   BUN 13.1 08/21/2017 1359   CREATININE 1.57 (H) 09/04/2017 0811   CREATININE 1.7 (H) 08/21/2017 1359      Component Value Date/Time   CALCIUM 9.3 09/04/2017 0811   CALCIUM 8.8 08/21/2017 1359   ALKPHOS 656 (H) 09/04/2017 0811   ALKPHOS 374 (H) 08/21/2017 1359   AST 85 (H) 09/04/2017 0811   AST 84 (H) 08/21/2017 1359   ALT 41 09/04/2017 0811   ALT 42 08/21/2017 1359   BILITOT 0.6 09/04/2017 0811   BILITOT 0.50 08/21/2017 1359       RADIOGRAPHIC STUDIES: Ct Chest W Contrast  Result Date: 09/04/2017 CLINICAL DATA:   Non-small cell right lung cancer. History of prostate cancer. EXAM: CT CHEST, ABDOMEN, AND PELVIS WITH CONTRAST TECHNIQUE: Multidetector CT imaging of the chest, abdomen and pelvis was performed following the standard protocol during bolus administration of intravenous contrast. CONTRAST:  32m ISOVUE-300 IOPAMIDOL (ISOVUE-300) INJECTION 61% COMPARISON:  06/12/2017 FINDINGS: CT CHEST FINDINGS Cardiovascular: The heart size is normal. No pericardial effusion. Right Port-A-Cath tip is at the SVC/RA junction. Mediastinum/Nodes: Right paratracheal mass shows continued interval progression, measuring 7.2 x 5.7 cm today compared to 5.8 x 3.8 cm previously. 14 mm subcarinal lymph node measured previously is stable at 14 mm. 9 mm AP window lymph node measured on the prior study is stable at 9 mm. The esophagus has normal imaging features. There is no axillary lymphadenopathy. Lungs/Pleura: Centrilobular and paraseptal emphysema noted. Dependent ground-glass attenuation in the lower lungs is similar to prior. The nodule within the cluster of bulla in the posterior right lower lobe is similar measuring 4 x 6 mm today compared to 5 x 7 mm previously. 5 mm nodule superior segment left lower lobe (image 55 series 4) is stable. 6 mm calcified nodule in the anterior left upper lobe (image 55) is similar to prior. A new 7 x 11 mm ill-defined nodule is seen in the left lower lobe on image 94 of series 4. Mild posterior pleural thickening in each hemithorax is similar to prior. Musculoskeletal: Similar appearance of bony metastatic disease in the thoracic spine, sternum, ribs, in both scapula. CT ABDOMEN PELVIS FINDINGS Hepatobiliary: 8 x 9 mm subcapsular lesion in the lateral segment left liver (image 46 series 2) is stable. No other focal parenchymal abnormality within the liver. There is no evidence for gallstones, gallbladder wall thickening, or pericholecystic fluid. Similar mild distention of the common duct hitting into the  head of the pancreas. Pancreas: Irregular hypoattenuating lesion in the head of pancreas with adjacent dystrophic calcification measures similar to slightly larger today at 2.8 x 1.9 cm compared to 2.4 x 1.8 cm previously. The main pancreatic duct is distended in the head of the pancreas with abrupt termination at the level of the lesion. Spleen: No splenomegaly. No focal mass lesion. Adrenals/Urinary Tract: No adrenal nodule or mass. Atrophy of the left kidney again noted. No suspicious renal mass. No hydroureteronephrosis. The urinary bladder appears normal  for the degree of distention. Stomach/Bowel: Stomach is nondistended. No gastric wall thickening. No evidence of outlet obstruction. Duodenum is normally positioned as is the ligament of Treitz. No small bowel wall thickening. No small bowel dilatation. The terminal ileum is normal. The appendix is not visualized, but there is no edema or inflammation in the region of the cecum. No gross colonic mass. No colonic wall thickening. No substantial diverticular change. Vascular/Lymphatic: There is abdominal aortic atherosclerosis without aneurysm. Similar appearance occlusion proximal left common iliac artery There is no gastrohepatic or hepatoduodenal ligament lymphadenopathy. No intraperitoneal or retroperitoneal lymphadenopathy. No pelvic sidewall lymphadenopathy. Reproductive: The prostate gland and seminal vesicles have normal imaging features. Other: No intraperitoneal free fluid. Musculoskeletal: Widespread bony metastatic involvement again noted, similar to prior. Compression deformity of L5 is progressive in the interval and there is new compression fracture at T12. Degenerative changes are noted in the hips bilaterally. Stable left groin hernia containing only fat. IMPRESSION: 1. Continued progression of the right paratracheal mass with stable appearance of borderline mediastinal lymphadenopathy. 2. Similar appearance of pre-existing pulmonary nodules. A  new 11 mm ill-defined nodule is identified in the left lower lobe and attention on follow-up recommended. 3. Similar appearance of the heterogeneous cystic lesion in the head of pancreas causing a degree of main pancreatic duct obstruction. As noted previously, this lesion was present on a PET-CT of 10/15/2014 and showed no hypermetabolism at that time. The lesion has been relatively stable in the 3 year interval since that PET-CT. 4. Similar appearance widespread bony metastatic involvement. Further progression of the L5 collapse with new compression deformity at T12 resulting in 50-75% loss of vertebral body height. 5. Aortic Atherosclerois (ICD10-170.0). Proximal left common iliac artery remains occluded. 6.  Emphysema. ([id number]) Electronically Signed   By: Misty Stanley M.D.   On: 09/04/2017 10:44   Ct Abdomen Pelvis W Contrast  Result Date: 09/04/2017 CLINICAL DATA:  Non-small cell right lung cancer. History of prostate cancer. EXAM: CT CHEST, ABDOMEN, AND PELVIS WITH CONTRAST TECHNIQUE: Multidetector CT imaging of the chest, abdomen and pelvis was performed following the standard protocol during bolus administration of intravenous contrast. CONTRAST:  31m ISOVUE-300 IOPAMIDOL (ISOVUE-300) INJECTION 61% COMPARISON:  06/12/2017 FINDINGS: CT CHEST FINDINGS Cardiovascular: The heart size is normal. No pericardial effusion. Right Port-A-Cath tip is at the SVC/RA junction. Mediastinum/Nodes: Right paratracheal mass shows continued interval progression, measuring 7.2 x 5.7 cm today compared to 5.8 x 3.8 cm previously. 14 mm subcarinal lymph node measured previously is stable at 14 mm. 9 mm AP window lymph node measured on the prior study is stable at 9 mm. The esophagus has normal imaging features. There is no axillary lymphadenopathy. Lungs/Pleura: Centrilobular and paraseptal emphysema noted. Dependent ground-glass attenuation in the lower lungs is similar to prior. The nodule within the cluster of bulla  in the posterior right lower lobe is similar measuring 4 x 6 mm today compared to 5 x 7 mm previously. 5 mm nodule superior segment left lower lobe (image 55 series 4) is stable. 6 mm calcified nodule in the anterior left upper lobe (image 55) is similar to prior. A new 7 x 11 mm ill-defined nodule is seen in the left lower lobe on image 94 of series 4. Mild posterior pleural thickening in each hemithorax is similar to prior. Musculoskeletal: Similar appearance of bony metastatic disease in the thoracic spine, sternum, ribs, in both scapula. CT ABDOMEN PELVIS FINDINGS Hepatobiliary: 8 x 9 mm subcapsular lesion in the lateral segment left  liver (image 46 series 2) is stable. No other focal parenchymal abnormality within the liver. There is no evidence for gallstones, gallbladder wall thickening, or pericholecystic fluid. Similar mild distention of the common duct hitting into the head of the pancreas. Pancreas: Irregular hypoattenuating lesion in the head of pancreas with adjacent dystrophic calcification measures similar to slightly larger today at 2.8 x 1.9 cm compared to 2.4 x 1.8 cm previously. The main pancreatic duct is distended in the head of the pancreas with abrupt termination at the level of the lesion. Spleen: No splenomegaly. No focal mass lesion. Adrenals/Urinary Tract: No adrenal nodule or mass. Atrophy of the left kidney again noted. No suspicious renal mass. No hydroureteronephrosis. The urinary bladder appears normal for the degree of distention. Stomach/Bowel: Stomach is nondistended. No gastric wall thickening. No evidence of outlet obstruction. Duodenum is normally positioned as is the ligament of Treitz. No small bowel wall thickening. No small bowel dilatation. The terminal ileum is normal. The appendix is not visualized, but there is no edema or inflammation in the region of the cecum. No gross colonic mass. No colonic wall thickening. No substantial diverticular change. Vascular/Lymphatic:  There is abdominal aortic atherosclerosis without aneurysm. Similar appearance occlusion proximal left common iliac artery There is no gastrohepatic or hepatoduodenal ligament lymphadenopathy. No intraperitoneal or retroperitoneal lymphadenopathy. No pelvic sidewall lymphadenopathy. Reproductive: The prostate gland and seminal vesicles have normal imaging features. Other: No intraperitoneal free fluid. Musculoskeletal: Widespread bony metastatic involvement again noted, similar to prior. Compression deformity of L5 is progressive in the interval and there is new compression fracture at T12. Degenerative changes are noted in the hips bilaterally. Stable left groin hernia containing only fat. IMPRESSION: 1. Continued progression of the right paratracheal mass with stable appearance of borderline mediastinal lymphadenopathy. 2. Similar appearance of pre-existing pulmonary nodules. A new 11 mm ill-defined nodule is identified in the left lower lobe and attention on follow-up recommended. 3. Similar appearance of the heterogeneous cystic lesion in the head of pancreas causing a degree of main pancreatic duct obstruction. As noted previously, this lesion was present on a PET-CT of 10/15/2014 and showed no hypermetabolism at that time. The lesion has been relatively stable in the 3 year interval since that PET-CT. 4. Similar appearance widespread bony metastatic involvement. Further progression of the L5 collapse with new compression deformity at T12 resulting in 50-75% loss of vertebral body height. 5. Aortic Atherosclerois (ICD10-170.0). Proximal left common iliac artery remains occluded. 6.  Emphysema. ([id number]) Electronically Signed   By: Misty Stanley M.D.   On: 09/04/2017 10:44   ASSESSMENT AND PLAN:  This is a very pleasant 57 years old African-American male with metastatic non-small cell lung cancer, adenocarcinoma status post induction systemic chemotherapy was carboplatin and Alimta. He was started on  second line treatment with Nivolumab 240 mg IV every 2 weeks is status post 48 cycles. He is currently on treatment with Nivolumab 480 MG IV every 4 weeks is status post 1 cycle. This treatment with the high dose Nivolumab was discontinued secondary to immunotherapy mediated hepatitis. He resumed his treatment with Nivolumab 240 mg IV every 2 weeks status post 4 cycles.   He is tolerating the treatment well.  I recommended for him to proceed with cycle #5 today if there is no concerning elevation of his liver enzymes.  His alkaline phosphatase has been elevated but this is most likely secondary to his progressive bone disease from the prostate cancer. I also discussed with the patient briefly the  option of palliative care and hospice referral.  He is meeting with the prostate oncologist at [facility] later in the next 1-2 weeks to discuss this option too. I will see him back for follow-up visit in 2 weeks for evaluation before the next cycle of his treatment. For pain management, this is likely from his prostate adenocarcinoma.  He will continue his current pain medication by the pain clinic at [facility]. The patient was advised to call immediately if he has any concerning symptoms in the interval. The patient voices understanding of current disease status and treatment options and is in agreement with the current care plan. All questions were answered. The patient knows to call the clinic with any problems, questions or concerns. We can certainly see the patient much sooner if necessary.  Disclaimer: This note was dictated with voice recognition software. Similar sounding words can inadvertently be transcribed and may not be corrected upon review.

## 2017-10-01 NOTE — Unmapped (Signed)
[facility] Pharm[facility] Refill Coordination Note  Specialty Medication(s): Xtandi 40mg       Bruce Parker, DOB: 09-17-1960  Phone: [phone] (home) , Alternate phone contact: N/A  Phone or address changes today?: No  All above HIPAA information was verified with patient.  Shipping Address: [address]  [place] [address]   Insurance changes? No    Completed refill call assessment today to schedule patient's medication shipment from the [facility] [phone]).      Confirmed the medication and dosage are correct and have not changed: Yes, regimen is correct and unchanged.    Confirmed patient started or stopped the following medications in the past month:  No, there are no changes reported at this time.    Are you tolerating your medication?:  Bruce Parker reports tolerating the medication.    ADHERENCE    Is this medicine transplant or covered by Medicare Part B? No.        Did you miss any doses in the past 4 weeks? No missed doses reported.    FINANCIAL/SHIPPING    Delivery Scheduled: Yes, Expected medication delivery date: 10/04/17     [name] did not have any additional questions at this time.    Delivery address validated in [facility] scheduling system: Yes, address listed in [facility] is correct.    We will follow up with patient monthly for standard refill processing and delivery.      Thank you,  [employer]   [facility] Specialty Pharmacist

## 2017-10-02 ENCOUNTER — Ambulatory Visit: Payer: Medicare Other | Admitting: Internal Medicine

## 2017-10-02 ENCOUNTER — Ambulatory Visit: Payer: Medicare Other

## 2017-10-02 ENCOUNTER — Other Ambulatory Visit: Payer: Medicare Other

## 2017-10-03 MED FILL — XTANDI/40MG/CAPS: XTANDI/40MG/CAPS | 30 days supply | Qty: 120 | Fill #9

## 2017-10-09 NOTE — Unmapped (Deleted)
RADIATION ONCOLOGY INITIAL CONSULTATION NOTE    Encounter Date: 10/10/2017  Patient Name: Bruce Parker  Medical Record Number: [id number]    Referring Physician: Maurie Boettcher, MD  [address]  [id number]  [place], [place] [address]  .  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 CANCER TREATMENT:  ***  2. RADIATION PLANNING:  He was in agreement with our plan.  A CT simulation will be performed in our clinic on *** to begin the planning process for the patient's radiation therapy.   We plan to treat *** to a dose of *** Gy in *** fractions.  Tentative start date will be ***.    INFORMED CONSENT:   {BSCINFORMEDCONSENT:26749}    REASON FOR CONSULTATION:   Bruce Parker is a 57 y.o. male who is seen in consultation at the request of Whang, Kelly Splinter, MD for an opinion regarding the use of radiation therapy in the treatment of his {Cancer Type:21372}.    DIAGNOSIS:  No diagnosis found.  Cancer Staging  No matching staging information was found for the patient.    HISTORY OF PRESENT ILLNESS:  Information pertinent to today's evaluation are the following:    04/2014, PSA elevated to 56.4. DRE normal.  Prostate bx scheduled, but canceled by pt.  09/2014, diagnosed with nonsmall cell lung cancer, stage IV (T2b, N2, M1b) with a large R hilar mass, mediastinal adenopathy, mets to bones. No uptake in liver or pancreas on PET scan. Insufficient material for molecular testing.  Guardant360 for ctDNA was negative.  Pt was subsequently treated with carboplatin/Alimta with initial good response.  Then disease progression.  On second line therapy with nivolumab.  01/2015, PSA 62.2.  Seen in Urology clinic and referred to [facility] for possibility of prostate cancer.  After discussions about various options, underwent bone bx in 05/2015, nondiagnostic. PSA down to 23.7  07/2015, prostate bx showed Gleason 4+5=9 adenocarcinoma.  08/2015, androgen deprivation therapy with Lupron started.  PSA 354.  11/2015, Lupron continued. PSA down to 1.7, which was the nadir. Subsequently, PSA began rising.  06/2016, PSA up to 32.  Enzalutamide started.  MRI of brain neg. Bone scan stable.  02/2017, PSA rise on enzalutamide. Bone scan stable. Radium 223 added  07/2017, s/p 6 infusions of Radium 223.  09/10/2017- NM Bone scan- diffuse mets mostly in axial skeleton as well as new lesions on R wrist and L heel.  Involved sites include bilateral scapulae, sternum, pelvis (predominantly the left ilium and ischium), and throughout the cervical, thoracic, and lumbar vertebral bodies (predominantly increased at T8, T11, L1). The new increased radiotracer uptake in the L1 vertebral body may represent vertebral fracture versus osseous metastases.    I have reviewed old/outside medical records (please see above for summary).    REVIEW OF SYSTEMS:    {ROS COMPLETE (# of SYSTEMS):[phone]}    Past Medical History:   Diagnosis Date   ??? Lung cancer (CMS-HCC)    ??? On supplemental oxygen therapy       Prior Radiation Therapy:{yes/no:310449}  Pacemaker: {yes/no:310449}  Pregnancy status: {PREG FINDINGS:25555}  Collagen Vascular Disease:{YES /ON:62952}    Past Surgical History:   Procedure Laterality Date   ??? APPENDECTOMY     ??? PR BRNCHSC EBUS GUIDED SAMPL 3/> NODE STATION/STRUX N/A 05/18/2015    Procedure: Bronch, Rigid Or Flexible, Including  Fluoro Guidance, When Performed; W Ebus Guided Transtracheal And/Or Transbronchial Sampling, 3 Or More Mediastinal And/Or Hilar Lymph Node Stations Or Structures;  Surgeon: Mathis Bud, MD;  Location: MAIN OR [facility];  Service: Pulmonary   ??? small intestines removed per pt          Family History   Problem Relation Age of Onset   ??? Stroke Mother    ??? Cancer Brother    ??? Melanoma Neg Hx    ??? Basal cell carcinoma Neg Hx    ??? Squamous cell carcinoma Neg Hx         Social History     Occupational History   ??? Not on file.     Social History Main Topics   ??? Smoking status: Former Smoker     Packs/day: 1.00     Years: 30.00     Types: Cigarettes     Quit date: 05/15/2014   ??? Smokeless tobacco: Never Used   ??? Alcohol use No   ??? Drug use: No   ??? Sexual activity: Not on file       ALLERGIES/MEDICATIONS:  Reviewed in EPIC    PHYSICAL EXAM:     Vital Signs for this encounter:  BSA: There is no height or weight on file to calculate BSA.  There were no vitals taken for this visit.  Karnofsky/Lansky Performance Status: {Karnofsky/Lansky Performance Status:22908}  General:   {Blank multiple:19196::No acute distress, alert and oriented X 4}   {male exam, choose systems:17872}    RADIOLOGY:  Imaging was personally reviewed as detailed in the history (see above).  ***    PATHOLOGY:   Pathology was personally reviewed as detailed in the HPI.       Labs:  No results found for: WBC, HGB, HCT, PLT, LDH, CREATININE, AST, ALT, MG      Electronically signed by:  Rayetta Humphrey, MD  Radiation Oncology PGY-5  Pager: [phone]  10/09/2017

## 2017-10-11 NOTE — Unmapped (Signed)
RN left message with caregiver phone for check in re: scheduling f/u with [facility] and or pain medication assessment.      RN had scheduled check in on same day as ONC appt 10/10/17 d/t no availability in [facility] schedule, but pt did not show for ONC appts.    CS database reviewed:    Last oxycontin refill--#60 on 09/12/17  Last Percocet refill--#240 on 09/12/17    Message routed to [facility] provider. Pr/caregiver requested to call [phone] with update.

## 2017-10-11 NOTE — Unmapped (Signed)
Pt's caregiver was returning your call. He can be reached at [phone].   Thanks in advance,  Deatra Ina  [facility]  [phone]

## 2017-10-14 NOTE — Unmapped (Signed)
-----   Message from Estevan Oaks sent at 10/11/2017  3:11 PM EST -----  Pt's brother called back, he is scheduled for 3/5.     Thanks,  Jaynie Bream   ----- Message -----  From: Marica Otter, RN  Sent: 10/11/2017   1:32 PM  To: Arelia [place]    Hi- can you please call to reschedule pt for next week.  Thanks,  J

## 2017-10-14 NOTE — Unmapped (Signed)
Attempted to contact pt's caregiver. Left vm to return call, if necessary. Pt's appt has already been rescheduled for 3/5.

## 2017-10-15 NOTE — Unmapped (Signed)
Left a message for pt's caregiver.

## 2017-10-16 ENCOUNTER — Ambulatory Visit: Payer: Medicare Other

## 2017-10-16 ENCOUNTER — Telehealth: Payer: Self-pay | Admitting: Medical Oncology

## 2017-10-16 ENCOUNTER — Ambulatory Visit: Payer: Medicare Other | Admitting: Internal Medicine

## 2017-10-16 ENCOUNTER — Other Ambulatory Visit: Payer: Medicare Other

## 2017-10-16 NOTE — Telephone Encounter (Signed)
Spoke to [place] -his sister. She stated pt is on Hospice. All appts cancelled. I todl her to call back if she needs anything.

## 2017-10-28 NOTE — Unmapped (Signed)
[facility] Refill Coordination Note  Specialty Medication(s): Xtandi 40mg       Bruce Parker, DOB: Sep 21, 1960  Phone: [phone] (home) , Alternate phone contact: N/A  Phone or address changes today?: No  All above HIPAA information was verified with patient.  Shipping Address: [address]  [place] [address]   Insurance changes? No    Completed refill call assessment today to schedule patient's medication shipment from the Bruce Parker [phone]).      Confirmed the medication and dosage are correct and have not changed: Yes, regimen is correct and unchanged.    Confirmed patient started or stopped the following medications in the past month:  No, there are no changes reported at this time.    Are you tolerating your medication?:  Bruce Parker reports tolerating the medication.    ADHERENCE    Is this medicine transplant or covered by Medicare Part B? No.        Did you miss any doses in the past 4 weeks? No missed doses reported.    FINANCIAL/SHIPPING    Delivery Scheduled: Yes, Expected medication delivery date: 10/30/17     The patient will receive an FSI print out for each medication shipped and additional FDA Medication Guides as required.  Patient education from Bruce Parker or Bruce Parker may also be included in the shipment.    Bruce Parker did not have any additional questions at this time.    Delivery address validated in FSI scheduling system: Yes, address listed in FSI is correct.    We will follow up with patient monthly for standard refill processing and delivery.      Thank you,  Bruce Parker   [facility] [place] [facility] Specialty Pharmacist

## 2017-10-29 MED ORDER — ENZALUTAMIDE 40 MG CAPSULE
ORAL_CAPSULE | Freq: Every day | ORAL | 11 refills | 0.00000 days | Status: CP
Start: 2017-10-29 — End: 2017-10-29

## 2017-10-29 MED ORDER — ENZALUTAMIDE 40 MG CAPSULE: 160 mg | capsule | Freq: Every day | 11 refills | 0 days | Status: AC

## 2017-10-29 NOTE — Unmapped (Signed)
Per test claim for [facility] at the [facility], patient needs Medication Assistance Program for Prior Authorization.

## 2017-10-30 ENCOUNTER — Other Ambulatory Visit: Payer: Medicare Other

## 2017-10-30 ENCOUNTER — Ambulatory Visit: Payer: Medicare Other

## 2017-10-30 ENCOUNTER — Ambulatory Visit: Payer: Medicare Other | Admitting: Internal Medicine

## 2017-11-01 NOTE — Unmapped (Signed)
-----   Message from Francena Hanly sent at 10/31/2017  9:13 AM EDT -----  Good morning,    I have a quick question about this patient. Is he in hospice?    Thank you!    Byrd Hesselbach

## 2017-11-01 NOTE — Unmapped (Signed)
Tried to call all numbers again in epic-unable to reach. Will wait for pt to reach out to [place]. Byrd Hesselbach is aware.

## 2017-11-13 ENCOUNTER — Ambulatory Visit: Payer: Medicare Other | Admitting: Internal Medicine

## 2017-11-13 ENCOUNTER — Other Ambulatory Visit: Payer: Medicare Other

## 2017-11-13 ENCOUNTER — Ambulatory Visit: Payer: Medicare Other

## 2017-11-26 ENCOUNTER — Telehealth: Payer: Self-pay

## 2017-11-26 NOTE — Telephone Encounter (Signed)
Receive call this AM that patient has passed away on 12/01/17. Cyndia Bent RN

## 2017-12-18 DEATH — deceased
